# Patient Record
Sex: Female | Born: 1960 | Race: Black or African American | Hispanic: No | Marital: Single | Smoking: Never smoker
Health system: Southern US, Community
[De-identification: ages and names within clinical notes are randomized; demographics above are authoritative.]

## PROBLEM LIST (undated history)

## (undated) DIAGNOSIS — Z9221 Personal history of antineoplastic chemotherapy: Secondary | ICD-10-CM

## (undated) DIAGNOSIS — I10 Essential (primary) hypertension: Secondary | ICD-10-CM

## (undated) DIAGNOSIS — Z973 Presence of spectacles and contact lenses: Secondary | ICD-10-CM

## (undated) DIAGNOSIS — B009 Herpesviral infection, unspecified: Secondary | ICD-10-CM

## (undated) DIAGNOSIS — C50311 Malignant neoplasm of lower-inner quadrant of right female breast: Secondary | ICD-10-CM

## (undated) DIAGNOSIS — Z171 Estrogen receptor negative status [ER-]: Secondary | ICD-10-CM

## (undated) DIAGNOSIS — G4700 Insomnia, unspecified: Secondary | ICD-10-CM

## (undated) DIAGNOSIS — F419 Anxiety disorder, unspecified: Secondary | ICD-10-CM

## (undated) DIAGNOSIS — F329 Major depressive disorder, single episode, unspecified: Secondary | ICD-10-CM

## (undated) DIAGNOSIS — G5793 Unspecified mononeuropathy of bilateral lower limbs: Secondary | ICD-10-CM

## (undated) DIAGNOSIS — G63 Polyneuropathy in diseases classified elsewhere: Secondary | ICD-10-CM

## (undated) DIAGNOSIS — R351 Nocturia: Secondary | ICD-10-CM

## (undated) DIAGNOSIS — C801 Malignant (primary) neoplasm, unspecified: Secondary | ICD-10-CM

## (undated) DIAGNOSIS — D649 Anemia, unspecified: Secondary | ICD-10-CM

## (undated) DIAGNOSIS — F32A Depression, unspecified: Secondary | ICD-10-CM

## (undated) DIAGNOSIS — Z9889 Other specified postprocedural states: Secondary | ICD-10-CM

## (undated) DIAGNOSIS — R112 Nausea with vomiting, unspecified: Secondary | ICD-10-CM

## (undated) DIAGNOSIS — R350 Frequency of micturition: Secondary | ICD-10-CM

## (undated) HISTORY — DX: Essential (primary) hypertension: I10

## (undated) HISTORY — PX: LAPAROSCOPIC TUBAL LIGATION: SUR803

## (undated) HISTORY — DX: Herpesviral infection, unspecified: B00.9

## (undated) HISTORY — PX: COLONOSCOPY: SHX174

---

## 2000-04-28 ENCOUNTER — Other Ambulatory Visit: Admission: RE | Admit: 2000-04-28 | Discharge: 2000-04-28 | Payer: Self-pay | Admitting: Gynecology

## 2000-05-02 ENCOUNTER — Encounter: Admission: RE | Admit: 2000-05-02 | Discharge: 2000-05-02 | Payer: Self-pay | Admitting: Gynecology

## 2000-05-02 ENCOUNTER — Encounter: Payer: Self-pay | Admitting: Gynecology

## 2000-08-03 ENCOUNTER — Other Ambulatory Visit: Admission: RE | Admit: 2000-08-03 | Discharge: 2000-08-03 | Payer: Self-pay | Admitting: Gynecology

## 2000-11-16 ENCOUNTER — Encounter: Payer: Self-pay | Admitting: Obstetrics

## 2000-11-16 ENCOUNTER — Ambulatory Visit (HOSPITAL_COMMUNITY): Admission: RE | Admit: 2000-11-16 | Discharge: 2000-11-16 | Payer: Self-pay | Admitting: Obstetrics

## 2001-01-23 ENCOUNTER — Ambulatory Visit (HOSPITAL_COMMUNITY): Admission: RE | Admit: 2001-01-23 | Discharge: 2001-01-23 | Payer: Self-pay | Admitting: Obstetrics

## 2001-01-23 ENCOUNTER — Encounter: Payer: Self-pay | Admitting: Obstetrics

## 2001-02-03 ENCOUNTER — Encounter: Payer: Self-pay | Admitting: Obstetrics

## 2001-02-03 ENCOUNTER — Ambulatory Visit (HOSPITAL_COMMUNITY): Admission: RE | Admit: 2001-02-03 | Discharge: 2001-02-03 | Payer: Self-pay | Admitting: Obstetrics

## 2001-06-07 ENCOUNTER — Inpatient Hospital Stay (HOSPITAL_COMMUNITY): Admission: AD | Admit: 2001-06-07 | Discharge: 2001-06-09 | Payer: Self-pay | Admitting: Obstetrics

## 2001-06-07 ENCOUNTER — Encounter (INDEPENDENT_AMBULATORY_CARE_PROVIDER_SITE_OTHER): Payer: Self-pay

## 2001-08-10 ENCOUNTER — Ambulatory Visit (HOSPITAL_COMMUNITY): Admission: RE | Admit: 2001-08-10 | Discharge: 2001-08-10 | Payer: Self-pay | Admitting: Obstetrics

## 2002-10-31 ENCOUNTER — Emergency Department (HOSPITAL_COMMUNITY): Admission: EM | Admit: 2002-10-31 | Discharge: 2002-10-31 | Payer: Self-pay | Admitting: Emergency Medicine

## 2002-11-14 ENCOUNTER — Other Ambulatory Visit: Admission: RE | Admit: 2002-11-14 | Discharge: 2002-11-14 | Payer: Self-pay | Admitting: Internal Medicine

## 2003-05-30 ENCOUNTER — Emergency Department (HOSPITAL_COMMUNITY): Admission: EM | Admit: 2003-05-30 | Discharge: 2003-05-30 | Payer: Self-pay | Admitting: Family Medicine

## 2003-12-05 ENCOUNTER — Other Ambulatory Visit: Admission: RE | Admit: 2003-12-05 | Discharge: 2003-12-05 | Payer: Self-pay | Admitting: Internal Medicine

## 2005-01-21 ENCOUNTER — Encounter: Admission: RE | Admit: 2005-01-21 | Discharge: 2005-01-21 | Payer: Self-pay | Admitting: Internal Medicine

## 2006-08-11 ENCOUNTER — Emergency Department (HOSPITAL_COMMUNITY): Admission: EM | Admit: 2006-08-11 | Discharge: 2006-08-11 | Payer: Self-pay | Admitting: Emergency Medicine

## 2006-12-21 ENCOUNTER — Emergency Department (HOSPITAL_COMMUNITY): Admission: EM | Admit: 2006-12-21 | Discharge: 2006-12-21 | Payer: Self-pay | Admitting: Family Medicine

## 2007-03-16 ENCOUNTER — Other Ambulatory Visit: Admission: RE | Admit: 2007-03-16 | Discharge: 2007-03-16 | Payer: Self-pay | Admitting: Gynecology

## 2007-10-19 ENCOUNTER — Encounter: Payer: Self-pay | Admitting: Gynecology

## 2007-10-19 ENCOUNTER — Ambulatory Visit: Payer: Self-pay | Admitting: Gynecology

## 2007-10-19 ENCOUNTER — Other Ambulatory Visit: Admission: RE | Admit: 2007-10-19 | Discharge: 2007-10-19 | Payer: Self-pay | Admitting: Gynecology

## 2008-02-12 ENCOUNTER — Encounter: Admission: RE | Admit: 2008-02-12 | Discharge: 2008-02-12 | Payer: Self-pay | Admitting: Gynecology

## 2008-03-19 ENCOUNTER — Ambulatory Visit: Payer: Self-pay | Admitting: Gynecology

## 2008-03-19 ENCOUNTER — Encounter: Payer: Self-pay | Admitting: Gynecology

## 2008-03-19 ENCOUNTER — Other Ambulatory Visit: Admission: RE | Admit: 2008-03-19 | Discharge: 2008-03-19 | Payer: Self-pay | Admitting: Gynecology

## 2008-08-19 ENCOUNTER — Ambulatory Visit: Payer: Self-pay | Admitting: Gynecology

## 2008-08-28 ENCOUNTER — Emergency Department (HOSPITAL_COMMUNITY): Admission: EM | Admit: 2008-08-28 | Discharge: 2008-08-28 | Payer: Self-pay | Admitting: Emergency Medicine

## 2008-10-23 ENCOUNTER — Ambulatory Visit: Payer: Self-pay | Admitting: Gynecology

## 2009-01-24 ENCOUNTER — Emergency Department (HOSPITAL_COMMUNITY): Admission: EM | Admit: 2009-01-24 | Discharge: 2009-01-24 | Payer: Self-pay | Admitting: Family Medicine

## 2009-02-12 ENCOUNTER — Ambulatory Visit: Payer: Self-pay | Admitting: Gynecology

## 2009-03-21 ENCOUNTER — Other Ambulatory Visit: Admission: RE | Admit: 2009-03-21 | Discharge: 2009-03-21 | Payer: Self-pay | Admitting: Gynecology

## 2009-03-21 ENCOUNTER — Encounter: Admission: RE | Admit: 2009-03-21 | Discharge: 2009-03-21 | Payer: Self-pay | Admitting: Gynecology

## 2009-03-21 ENCOUNTER — Ambulatory Visit: Payer: Self-pay | Admitting: Gynecology

## 2009-04-22 ENCOUNTER — Ambulatory Visit: Payer: Self-pay | Admitting: Hematology and Oncology

## 2009-04-24 LAB — MORPHOLOGY

## 2009-04-24 LAB — URINALYSIS, MICROSCOPIC - CHCC
Leukocyte Esterase: NEGATIVE
Protein: NEGATIVE mg/dL
RBC count: NEGATIVE (ref 0–2)
pH: 8 (ref 4.6–8.0)

## 2009-04-24 LAB — CBC & DIFF AND RETIC
Basophils Absolute: 0 10*3/uL (ref 0.0–0.1)
EOS%: 0.5 % (ref 0.0–7.0)
HCT: 33 % — ABNORMAL LOW (ref 34.8–46.6)
HGB: 11.5 g/dL — ABNORMAL LOW (ref 11.6–15.9)
Immature Retic Fract: 3.7 % (ref 0.00–10.70)
LYMPH%: 39.8 % (ref 14.0–49.7)
MCH: 32.7 pg (ref 25.1–34.0)
MCV: 93.8 fL (ref 79.5–101.0)
NEUT%: 51.4 % (ref 38.4–76.8)
Platelets: 256 10*3/uL (ref 145–400)
lymph#: 1.5 10*3/uL (ref 0.9–3.3)

## 2009-04-28 LAB — FECAL OCCULT BLOOD, GUAIAC: Occult Blood: NEGATIVE

## 2009-04-28 LAB — IRON AND TIBC: TIBC: 262 ug/dL (ref 250–470)

## 2009-04-28 LAB — LACTATE DEHYDROGENASE: LDH: 130 U/L (ref 94–250)

## 2009-04-28 LAB — COMPREHENSIVE METABOLIC PANEL
ALT: 10 U/L (ref 0–35)
CO2: 27 mEq/L (ref 19–32)
Calcium: 8.9 mg/dL (ref 8.4–10.5)
Chloride: 102 mEq/L (ref 96–112)
Creatinine, Ser: 0.85 mg/dL (ref 0.40–1.20)
Total Protein: 7.1 g/dL (ref 6.0–8.3)

## 2009-04-28 LAB — HEMOGLOBINOPATHY EVALUATION
Hgb A: 97.5 % (ref 96.8–97.8)
Hgb F Quant: 0 % (ref 0.0–2.0)
Hgb S Quant: 0 % (ref 0.0–0.0)

## 2009-04-28 LAB — PROTEIN ELECTROPHORESIS, SERUM, WITH REFLEX
Beta 2: 3.7 % (ref 3.2–6.5)
Beta Globulin: 4.7 % (ref 4.7–7.2)

## 2009-04-28 LAB — HAPTOGLOBIN: Haptoglobin: 123 mg/dL (ref 16–200)

## 2009-09-01 ENCOUNTER — Ambulatory Visit: Payer: Self-pay | Admitting: Hematology and Oncology

## 2010-02-17 ENCOUNTER — Other Ambulatory Visit: Payer: Self-pay | Admitting: Gynecology

## 2010-02-17 DIAGNOSIS — Z1239 Encounter for other screening for malignant neoplasm of breast: Secondary | ICD-10-CM

## 2010-03-23 ENCOUNTER — Ambulatory Visit: Payer: Self-pay

## 2010-04-13 ENCOUNTER — Encounter (INDEPENDENT_AMBULATORY_CARE_PROVIDER_SITE_OTHER): Payer: Managed Care, Other (non HMO) | Admitting: Gynecology

## 2010-04-13 ENCOUNTER — Other Ambulatory Visit: Payer: Self-pay | Admitting: Gynecology

## 2010-04-13 ENCOUNTER — Other Ambulatory Visit (HOSPITAL_COMMUNITY)
Admission: RE | Admit: 2010-04-13 | Discharge: 2010-04-13 | Disposition: A | Discharge status: Home / Self Care | Payer: Managed Care, Other (non HMO) | Source: Ambulatory Visit | Attending: Gynecology | Admitting: Gynecology

## 2010-04-13 DIAGNOSIS — Z01419 Encounter for gynecological examination (general) (routine) without abnormal findings: Secondary | ICD-10-CM

## 2010-04-13 DIAGNOSIS — Z124 Encounter for screening for malignant neoplasm of cervix: Secondary | ICD-10-CM | POA: Insufficient documentation

## 2010-06-12 NOTE — Op Note (Signed)
[facility]  Patient:    Jaime Murillo, Jaime Murillo Visit Number: [id number] MRN: [id number]          Service Type: DSU Location: [facility] Attending Physician:  Venita Sheffield Dictated by:   Kathreen Cosier, M.D. Proc. Date: 08/10/01 Admit Date:  08/10/2001 Discharge Date: 08/10/2001                             Operative Report  PREOPERATIVE DIAGNOSIS:       Multiparity.  PROCEDURE:                    Open laparoscopic tubal sterilization.  SURGEON:                      Kathreen Cosier, M.D.  DESCRIPTION OF PROCEDURE:     Under general anesthesia with the patient in the lithotomy position, the abdomen, perineum and vagina were prepped and draped. The bladder was emptied with a straight catheter.  A weighted speculum was placed in the vagina.  The anterior lip of the cervix was grasped with a Hulka tenaculum.  In the umbilicus, a transverse incision was made and carried down to the fascia.  The fascia was cleanly grasped with two Kochers.  The fascia in the peritoneum was opened with Mayo scissors.  The sleeve of the trocar was inserted intraperitoneally.  A total of 3 L of carbon dioxide was infused intraperitoneally.  The visualizing scope was inserted.  The uterus, tube and ovaries were normal.  A cautery probe was inserted through the sleeve of the scope.  The right tube was grasped 1 inch from the cornu and cauterized.  It was cauterized in a total of five places moving lateral to the first site of cautery.  The procedure was done in the exact same fashion on the other side. The probe was removed and CO2 allowed to escape from the peritoneal cavity. The fascia was closed with one suture of 0 Dexon.  The skin was closed with subcuticular stitch of 3-0 plain.  The patient tolerated the procedure well and was taken to the recovery room in good condition. Dictated by:   Kathreen Cosier, M.D. Attending Physician:  Venita Sheffield DD:  08/10/01 TD:  08/14/01 Job: [id number]

## 2010-11-03 LAB — POCT URINALYSIS DIP (DEVICE)
Ketones, ur: NEGATIVE
Operator id: 208841
Protein, ur: NEGATIVE
Specific Gravity, Urine: 1.02
pH: 7

## 2011-02-12 ENCOUNTER — Telehealth: Payer: Self-pay | Admitting: *Deleted

## 2011-02-12 MED ORDER — HYDROCHLOROTHIAZIDE 25 MG PO TABS: 25.0000 mg | ORAL_TABLET | Freq: Every day | ORAL | Status: DC

## 2011-02-12 MED ORDER — ACYCLOVIR 400 MG PO TABS: 400.0000 mg | ORAL_TABLET | Freq: Every day | ORAL | Status: AC

## 2011-02-12 NOTE — Telephone Encounter (Signed)
Pt Jaime Murillo staff message okay to give HCTZ 25 MG AND ACYCLOVIR 400 90 DAY SUPPLY. RX SENT TO PHARMACY.

## 2011-02-12 NOTE — Telephone Encounter (Signed)
Pt called needed new 90 day  rx for mail order pharmacy for valtrex  400 mg daily and hydrochlorothiazide 25 mg daily. Okay send rx? Please advise

## 2011-04-15 ENCOUNTER — Telehealth: Payer: Self-pay | Admitting: *Deleted

## 2011-04-15 MED ORDER — HYDROCHLOROTHIAZIDE 25 MG PO TABS: 25.0000 mg | ORAL_TABLET | Freq: Every day | ORAL | Status: DC

## 2011-04-15 MED ORDER — ACYCLOVIR 400 MG PO TABS: 400.0000 mg | ORAL_TABLET | Freq: Every day | ORAL | Status: AC

## 2011-04-15 NOTE — Telephone Encounter (Signed)
Pt called with new pharamcy and wants rx sent to [facility] rx pharmacy. rx sent.

## 2011-04-21 ENCOUNTER — Telehealth: Payer: Self-pay | Admitting: *Deleted

## 2011-04-21 NOTE — Telephone Encounter (Signed)
Pt now has mail order pharmacy [employer] and will need a new rx for her HCTZ 25 MG AND ACYCLOVIR 400 mg(see 02/12/11 telephone encounter) . Pt has annual scheduled on 4/10. Rx is on pt paper chart to fill out and i will fax to pharmacy.

## 2011-04-21 NOTE — Telephone Encounter (Signed)
Both rx written will be faxed to [phone].

## 2011-05-05 ENCOUNTER — Ambulatory Visit (INDEPENDENT_AMBULATORY_CARE_PROVIDER_SITE_OTHER): Payer: Managed Care, Other (non HMO) | Admitting: Gynecology

## 2011-05-05 ENCOUNTER — Encounter: Payer: Self-pay | Admitting: Gynecology

## 2011-05-05 ENCOUNTER — Telehealth: Payer: Self-pay

## 2011-05-05 VITALS — BP 134/88 | Ht 64.75 in | Wt 115.0 lb

## 2011-05-05 DIAGNOSIS — R634 Abnormal weight loss: Secondary | ICD-10-CM

## 2011-05-05 DIAGNOSIS — Z01419 Encounter for gynecological examination (general) (routine) without abnormal findings: Secondary | ICD-10-CM

## 2011-05-05 DIAGNOSIS — I10 Essential (primary) hypertension: Secondary | ICD-10-CM

## 2011-05-05 NOTE — Telephone Encounter (Signed)
Dr. Glenetta Hew sent me a note asking me to check ins benefits for ablation. I did and it goes towards patient's deductible and will cost he $3462 to have.  I let Dr. Glenetta Hew know and he responded with below text in a staff message:  Jaime Murillo a forgot to mention to see them they will cover for the Mirena IUD. Please check on this and let her to as well at this would be an option because it was cut down her bleeding or there is medication that I could put her on call Lysteda 650 mg which she can take 2 tablets 3 times a day during her cycle to cut down the amount of bleeding. We can prescribe her 30 with 11 refills. Thank you  I have left message for patient to call me and I will share this info.

## 2011-05-05 NOTE — Telephone Encounter (Signed)
Mirena and insertion covered 100% in keeping with government health care reform.

## 2011-05-05 NOTE — Patient Instructions (Signed)
Jaime Murillo, remember to schedule your mammogram. Also after your birthday, please call the gastroenterologist to schedule her colonoscopy. Olegario Messier will call you sometime this week or next in reference to the endometrial ablation for your heavy periods. Remember also to follow with the [facility] family Dr. Maryclare Labrador need to adjust your blood pressure medication please take care of this as soon as possible.

## 2011-05-05 NOTE — Progress Notes (Signed)
Jaime Murillo 23-Sep-1960 [id number]   History:    51 y.o.  for annual exam with complaints of heavy periods lasting 5-6 days. The first 2-3 days are heavy with passage of large blood clots. Patient with prior history of tubal ligation. Patient's last mammogram in 2011. She does her monthly self breast examination. Patient will be 51 in June. She has not seen her primary physician in over year. She does have history of hypertension for which she is on HCTZ 25 mg daily. Her blood pressure was 150/94 and on repeat was 134/88. Patient has a low BMI of 19.28.  Past medical history,surgical history, family history and social history were all reviewed and documented in the EPIC chart.  Gynecologic History Patient's last menstrual period was 04/15/2011. Contraception: tubal ligation Last Pap: 2012. Results were: normal Last mammogram: 2011. Results were: normal  Obstetric History OB History    Grav Para Term Preterm Abortions TAB SAB Ect Mult Living   3 2 2  1  1   2      # Outc Date GA Lbr Len/2nd Wgt Sex Del Anes PTL Lv   1 TRM     M SVD  No Yes   2 TRM     M SVD  No Yes   3 SAB                ROS:  Was performed and pertinent positives and negatives are included in the history.  Exam: chaperone present  BP 134/88  Ht 5' 4.75" (1.645 m)  Wt 115 lb (52.164 kg)  BMI 19.29 kg/m2  LMP 04/15/2011  Body mass index is 19.29 kg/(m^2).  General appearance : Well developed well nourished female. No acute distress HEENT: Neck supple, trachea midline, no carotid bruits, no thyroidmegaly Lungs: Clear to auscultation, no rhonchi or wheezes, or rib retractions  Heart: Regular rate and rhythm, no murmurs or gallops Breast:Examined in sitting and supine position were symmetrical in appearance, no palpable masses or tenderness,  no skin retraction, no nipple inversion, no nipple discharge, no skin discoloration, no axillary or supraclavicular lymphadenopathy Abdomen: no palpable masses or  tenderness, no rebound or guarding Extremities: no edema or skin discoloration or tenderness  Pelvic:  Bartholin, Urethra, Skene Glands: Within normal limits             Vagina: No gross lesions or discharge  Cervix: No gross lesions or discharge  Uterus  retroverted slightly irregular at the fundus possible small fibroids,  non-tender and mobile  Adnexa  Without masses or tenderness  Anus and perineum  normal   Rectovaginal  normal sphincter tone without palpated masses or tenderness             Hemoccult normal rectal exam.     Assessment/Plan:  51 y.o. female for annual exam perimenopausal with heavy periods. We discussed endometrial ablation in the office (cryoablation) for which literature information was provided. Patient is interested and will check insurance coverage and schedule sonohysterogram and endometrial biopsy accordingly. We discussed the new screening guidelines on Pap smears. No prior abnormal Pap smears . Last normal Pap smear 2012. No Pap smear done today. When patient turns 51 in June she will need to schedule shortly thereafter a colonoscopy with the gastroenterologist for which I have given her names. She was reminded to followup with her family physician in reference to her hypertension. The following labs were drawn today: CBC, cholesterol, competence metabolic panel, urinalysis, and TSH. She was encouraged to continue  to do her monthly self breast examination and to take calcium and vitamin D for osteoporosis prevention. She exercises on a regular basis.    [name] MD, 9:14 AM 05/05/2011

## 2011-05-06 LAB — URINALYSIS W MICROSCOPIC + REFLEX CULTURE
Casts: NONE SEEN
Crystals: NONE SEEN
Glucose, UA: NEGATIVE mg/dL
Hgb urine dipstick: NEGATIVE
Leukocytes, UA: NEGATIVE
Specific Gravity, Urine: 1.011 (ref 1.005–1.030)
pH: 6 (ref 5.0–8.0)

## 2011-05-13 ENCOUNTER — Telehealth: Payer: Self-pay | Admitting: Gynecology

## 2011-05-13 NOTE — Telephone Encounter (Signed)
I talked with [employer] again.  If patient is not using the IUD for birth control then it falls under her regular medical benefits. The [employer] reform only affects IUD being used for the purpose of birth control.  Patient informed that Her Option ablation will cost her $67 and Mirena IUD will cost her [phone].  Patient said neither of these are financially feasible options as this time.    I informed Dr. Glenetta Hew.

## 2011-05-14 ENCOUNTER — Telehealth: Payer: Self-pay | Admitting: Gynecology

## 2011-05-14 ENCOUNTER — Other Ambulatory Visit: Payer: Self-pay | Admitting: Gynecology

## 2011-05-14 MED ORDER — TRANEXAMIC ACID 650 MG PO TABS: ORAL_TABLET | ORAL | Status: DC

## 2011-05-14 NOTE — Telephone Encounter (Signed)
Opened this encounter in error.

## 2011-05-14 NOTE — Telephone Encounter (Signed)
I called patient to let her know that since the Endo Ablation and Mirena are not financially feasible options for her Dr. Glenetta Hew suggested she might try Lysteda with her cycle to decrease her bleeding.  He told me he will put in an e-RX for her.  I told patient I do not have any idea regarding costs and she might give her a pharmacy a heads up that RX is coming in and ask them to call her with the price before they fill it in case it is also expensive.  I told her if it is too costly for her to try to call me and let me know and we will let Dr.JF know.  She agreed and would like to try it if it is reasonably affordable.

## 2011-10-26 ENCOUNTER — Telehealth: Payer: Self-pay | Admitting: *Deleted

## 2011-10-26 NOTE — Telephone Encounter (Signed)
Prescribed 30 tablets only with no refill she does need to followup with her primary for her hypertension.

## 2011-10-26 NOTE — Telephone Encounter (Signed)
Pt calling to requesting a refill on her hydrochlorothiazide 25 mg tablet 1 po daily. 90 day supply. Okay to fill?

## 2011-10-27 MED ORDER — HYDROCHLOROTHIAZIDE 25 MG PO TABS: 25.0000 mg | ORAL_TABLET | Freq: Every day | ORAL | Status: DC

## 2011-10-27 NOTE — Telephone Encounter (Signed)
rx sent, pt informed with all the below note.

## 2011-11-28 ENCOUNTER — Emergency Department (INDEPENDENT_AMBULATORY_CARE_PROVIDER_SITE_OTHER)
Admission: EM | Admit: 2011-11-28 | Discharge: 2011-11-28 | Disposition: A | Discharge status: Home / Self Care | Payer: Managed Care, Other (non HMO) | Source: Home / Self Care | Attending: Emergency Medicine | Admitting: Emergency Medicine

## 2011-11-28 ENCOUNTER — Encounter (HOSPITAL_COMMUNITY): Payer: Self-pay | Admitting: Emergency Medicine

## 2011-11-28 DIAGNOSIS — R8290 Unspecified abnormal findings in urine: Secondary | ICD-10-CM

## 2011-11-28 DIAGNOSIS — R82998 Other abnormal findings in urine: Secondary | ICD-10-CM

## 2011-11-28 DIAGNOSIS — R109 Unspecified abdominal pain: Secondary | ICD-10-CM

## 2011-11-28 LAB — POCT PREGNANCY, URINE: Preg Test, Ur: NEGATIVE

## 2011-11-28 MED ORDER — NITROFURANTOIN MONOHYD MACRO 100 MG PO CAPS: 100.0000 mg | ORAL_CAPSULE | Freq: Two times a day (BID) | ORAL | Status: AC

## 2011-11-28 NOTE — ED Notes (Addendum)
Pt c/o of lower back pain and right flank pain x 6 days. Strong odor to urine x 2 days.  Pt denies pain or burning with urination.  Pt denies any other symptoms. Pt has increased water intake and drinking cranberry juice with no relief in symptoms.  Pt has a hx of hypertension blood pressure to day is 171/96 pt states that she has not been taking med as prescribed.

## 2011-11-28 NOTE — ED Provider Notes (Signed)
History     CSN: [id number]  Arrival date & time 11/28/11  1003   First MD Initiated Contact with Patient 11/28/11 1008      Chief Complaint  Patient presents with  . Flank Pain    lower back and right flank pain x 6 days. strong odor to urine x 2 days.     (Consider location/radiation/quality/duration/timing/severity/associated sxs/prior treatment) HPI Comments: Patient presents urgent care today complaining of right flank pain that has since then somewhat improved. She also been noticing for about a week some strong odor to her urine but has not seen any blood denies any burning, pressure or frequent urination. Patient denies any vomiting, nausea or fevers denies any recent fall or injuries. Patient also denies any constitutional symptoms such as fevers, generalized malaise, myalgias arthralgias or unintentional weight loss or changes in appetite.  Patient is a 51 y.o. female presenting with flank pain. The history is provided by the patient.  Flank Pain This is a new problem. The current episode started more than 2 days ago. The problem occurs constantly. The problem has not changed since onset.Pertinent negatives include no abdominal pain. She has tried nothing for the symptoms.    Past Medical History  Diagnosis Date  . HSV infection   . NSVD (normal spontaneous vaginal delivery)   . Hypertension     Past Surgical History  Procedure Date  . Tubal ligation 2003    LTL    Family History  Problem Relation Age of Onset  . Heart disease Mother   . Hypertension Father   . Hypertension Sister     History  Substance Use Topics  . Smoking status: Never Smoker   . Smokeless tobacco: Never Used  . Alcohol Use: No    OB History    Grav Para Term Preterm Abortions TAB SAB Ect Mult Living   3 2 2  1  1   2       Review of Systems  Constitutional: Negative for fever, chills, activity change, appetite change and fatigue.  Gastrointestinal: Negative for nausea and  abdominal pain.  Genitourinary: Positive for flank pain. Negative for dysuria, vaginal discharge, vaginal pain, pelvic pain and dyspareunia.  Skin: Negative for rash and wound.  Neurological: Negative for dizziness.    Allergies  Review of patient's allergies indicates no known allergies.  Home Medications   Current Outpatient Rx  Name  Route  Sig  Dispense  Refill  . FERROUS SULFATE 325 (65 FE) MG PO TABS   Oral   Take 325 mg by mouth daily with breakfast.         . HYDROCHLOROTHIAZIDE 25 MG PO TABS   Oral   Take 1 tablet (25 mg total) by mouth daily.   30 tablet   0   . ONE-DAILY MULTI VITAMINS PO TABS   Oral   Take 1 tablet by mouth daily.         Marland Kitchen NITROFURANTOIN MONOHYD MACRO 100 MG PO CAPS   Oral   Take 1 capsule (100 mg total) by mouth 2 (two) times daily.   10 capsule   0   . TRANEXAMIC ACID 650 MG PO TABS      Take 2 tablets 3 times a day for 5 days are heavy periods.   30 tablet   12   . VALACYCLOVIR HCL 500 MG PO TABS   Oral   Take 500 mg by mouth 2 (two) times daily.  BP 171/96  Pulse 60  Temp 98 F (36.7 C) (Oral)  Resp 16  SpO2 100%  LMP 11/09/2011  Physical Exam  Nursing note and vitals reviewed. Constitutional: Vital signs are normal. She appears well-developed and well-nourished.  Non-toxic appearance. She does not have a sickly appearance. She does not appear ill. No distress.  Pulmonary/Chest: Effort normal and breath sounds normal.  Abdominal: Soft. She exhibits no distension and no mass. There is no hepatomegaly. There is no tenderness. There is no rigidity, no rebound, no guarding, no CVA tenderness, no tenderness at McBurney's point and negative Murphy's sign.  Neurological: She is alert.  Skin: No rash noted. No erythema.    ED Course  Procedures (including critical care time)   Labs Reviewed  POCT PREGNANCY, URINE  URINE CULTURE   No results found.   1. Flank pain   2. Abnormal urine      Bedside  ultrasound performed- as complementary of physical exam. No obvious masses or signs of moderate to severe hydronephrosis were noted. Morison's pouch unremarkable. MDM  Patient is somewhat asymptomatic during exam unable to reproduce any flank pain right upper quadrant pain. Urine reveal only trace of red blood cells. Patient is afebrile comfortable. Given patient's previous symptoms have decided to start on Macrobid and told urine culture becomes available patient agrees with treatment plan and followup care.        Jimmie Molly, MD 11/28/11 1321

## 2011-11-29 LAB — POCT URINALYSIS DIP (DEVICE)
Protein, ur: NEGATIVE mg/dL
Specific Gravity, Urine: 1.02 (ref 1.005–1.030)
Urobilinogen, UA: 2 mg/dL — ABNORMAL HIGH (ref 0.0–1.0)
pH: 7 (ref 5.0–8.0)

## 2012-05-29 ENCOUNTER — Other Ambulatory Visit: Payer: Self-pay

## 2012-05-29 DIAGNOSIS — Z1231 Encounter for screening mammogram for malignant neoplasm of breast: Secondary | ICD-10-CM

## 2012-06-06 ENCOUNTER — Telehealth: Payer: Self-pay | Admitting: *Deleted

## 2012-06-06 ENCOUNTER — Ambulatory Visit (INDEPENDENT_AMBULATORY_CARE_PROVIDER_SITE_OTHER): Payer: Managed Care, Other (non HMO) | Admitting: Gynecology

## 2012-06-06 ENCOUNTER — Encounter: Payer: Self-pay | Admitting: Gynecology

## 2012-06-06 VITALS — BP 138/86 | Ht 63.25 in | Wt 118.0 lb

## 2012-06-06 DIAGNOSIS — N6310 Unspecified lump in the right breast, unspecified quadrant: Secondary | ICD-10-CM

## 2012-06-06 DIAGNOSIS — N63 Unspecified lump in unspecified breast: Secondary | ICD-10-CM

## 2012-06-06 DIAGNOSIS — B009 Herpesviral infection, unspecified: Secondary | ICD-10-CM

## 2012-06-06 DIAGNOSIS — Z1159 Encounter for screening for other viral diseases: Secondary | ICD-10-CM

## 2012-06-06 DIAGNOSIS — A609 Anogenital herpesviral infection, unspecified: Secondary | ICD-10-CM

## 2012-06-06 DIAGNOSIS — Z01419 Encounter for gynecological examination (general) (routine) without abnormal findings: Secondary | ICD-10-CM

## 2012-06-06 LAB — COMPREHENSIVE METABOLIC PANEL
Albumin: 3.9 g/dL (ref 3.5–5.2)
CO2: 26 mEq/L (ref 19–32)
Glucose, Bld: 83 mg/dL (ref 70–99)
Sodium: 137 mEq/L (ref 135–145)
Total Bilirubin: 0.8 mg/dL (ref 0.3–1.2)
Total Protein: 6.9 g/dL (ref 6.0–8.3)

## 2012-06-06 LAB — CBC WITH DIFFERENTIAL/PLATELET
Eosinophils Absolute: 0 10*3/uL (ref 0.0–0.7)
Eosinophils Relative: 0 % (ref 0–5)
HCT: 34.4 % — ABNORMAL LOW (ref 36.0–46.0)
Hemoglobin: 11.6 g/dL — ABNORMAL LOW (ref 12.0–15.0)
Lymphs Abs: 1.2 10*3/uL (ref 0.7–4.0)
MCH: 31.4 pg (ref 26.0–34.0)
MCV: 93 fL (ref 78.0–100.0)
Monocytes Relative: 9 % (ref 3–12)
Neutrophils Relative %: 55 % (ref 43–77)
RBC: 3.7 MIL/uL — ABNORMAL LOW (ref 3.87–5.11)

## 2012-06-06 LAB — HEPATITIS C ANTIBODY: HCV Ab: NEGATIVE

## 2012-06-06 LAB — LIPID PANEL: HDL: 73 mg/dL (ref >39)

## 2012-06-06 LAB — TSH: TSH: 1.495 u[IU]/mL (ref 0.350–4.500)

## 2012-06-06 MED ORDER — VALACYCLOVIR HCL 500 MG PO TABS: 500.0000 mg | ORAL_TABLET | Freq: Every day | ORAL | Status: DC

## 2012-06-06 NOTE — Telephone Encounter (Signed)
Message copied by Aura Camps on Tue Jun 06, 2012  9:55 AM ------      Message from: [name]      Created: Tue Jun 06, 2012  8:57 AM       Victorino Dike, this patient was scheduled for a routine screening mammogram May 23 at the breast Center. When she came in today for annual exam and abnormality was detected on her right breast and she needs a diagnostic mammogram of the right breast with ultrasound and screening mammogram contralateral breast (preferably 3-D because she has dense breasts according to the last mammogram in 2011.             the patient was found to have on the right breast at the 12:00 position to finger breast from the areolar region a 2 cm multi-lobulated mass mobile and nontender. There was no supraclavicular axillary lymphadenopathy on that breast. Contralateral breast with no abnormalities detected.       ------

## 2012-06-06 NOTE — Progress Notes (Signed)
Jaime Murillo Nov 01, 1960 [id number]   History:    52 y.o.  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 On questioning patient states that she gets at least 4 outbreaks a year. Patient with prior tubal sterilization procedure. Her last mammogram was in 2011. Menstrual cycles reported to be regular. No change in sexual partner. Patient with normal Pap smears in the past. Patient's Tdap vaccine according to patient is up-to-date. Patient has a schedule mammogram for next week.  Past medical history,surgical history, family history and social history were all reviewed and documented in the EPIC chart.  Gynecologic History Patient's last menstrual period was 05/20/2012. Contraception: tubal ligation Last Pap: 2012. Results were: normal Last mammogram: 2011. Results were: normal  Obstetric History OB History   Grav Para Term Preterm Abortions TAB SAB Ect Mult Living   3 2 2  1  1   2      # Outc Date GA Lbr Len/2nd Wgt Sex Del Anes PTL Lv   1 TRM     M SVD  No Yes   2 TRM     M SVD  No Yes   3 SAB                ROS: A ROS was performed and pertinent positives and negatives are included in the history.  GENERAL: No fevers or chills. HEENT: No change in vision, no earache, sore throat or sinus congestion. NECK: No pain or stiffness. CARDIOVASCULAR: No chest pain or pressure. No palpitations. PULMONARY: No shortness of breath, cough or wheeze. GASTROINTESTINAL: No abdominal pain, nausea, vomiting or diarrhea, melena or bright red blood per rectum. GENITOURINARY: No urinary frequency, urgency, hesitancy or dysuria. MUSCULOSKELETAL: No joint or muscle pain, no back pain, no recent trauma. DERMATOLOGIC: No rash, no itching, no lesions. ENDOCRINE: No polyuria, polydipsia, no heat or cold intolerance. No recent change in weight. HEMATOLOGICAL: No anemia or easy bruising or bleeding. NEUROLOGIC: No headache, seizures, numbness, tingling or weakness.  PSYCHIATRIC: No depression, no loss of interest in normal activity or change in sleep pattern.     Exam: chaperone present  BP 138/86  Ht 5' 3.25" (1.607 m)  Wt 118 lb (53.524 kg)  BMI 20.73 kg/m2  LMP 05/20/2012  Body mass index is 20.73 kg/(m^2).  General appearance : Well developed well nourished female. No acute distress HEENT: Neck supple, trachea midline, no carotid bruits, no thyroidmegaly Lungs: Clear to auscultation, no rhonchi or wheezes, or rib retractions  Heart: Regular rate and rhythm, no murmurs or gallops Breast:Examined in sitting and supine position were symmetrical in appearance, the patient was found to have on the right breast at the 12:00 position to finger breast from the areolar region a 2 cm multi-lobulated mass mobile and nontender. There was no supraclavicular axillary lymphadenopathy on that breast. Contralateral breast with no abnormalities detected. Abdomen: no palpable masses or tenderness, no rebound or guarding Extremities: no edema or skin discoloration or tenderness  Pelvic:  Bartholin, Urethra, Skene Glands: Within normal limits             Vagina: No gross lesions or discharge  Cervix: No gross lesions or discharge  Uterus  anteverted, normal size, shape and consistency, non-tender and mobile  Adnexa  Without masses or tenderness  Anus and perineum  normal   Rectovaginal  normal sphincter tone without palpated masses or tenderness  Hemoccult not indicated     Assessment/Plan:  52 y.o. female for annual exam with incidental findings of a right breast mass. Patient previously had schedule for a screening mammogram in 2 weeks. With today's findings we will try to move her mammogram much sooner and we'll proceed with scheduling a diagnostic mammogram with ultrasound. The following labs will be work today: Comprehensive metabolic panel, TSH, fasting lipid profile, and urinalysis. No Pap smear done today U. Screening guidelines discussed.  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  New CDC guidelines is recommending patients be tested once in her lifetime for hepatitis C antibody who were born between [birth year] through 1964. This was discussed with the patient today and has agreed to be tested today.    [name] MD, 8:53 AM 06/06/2012

## 2012-06-06 NOTE — Telephone Encounter (Signed)
Order placed for the below note.

## 2012-06-07 ENCOUNTER — Telehealth: Payer: Self-pay | Admitting: *Deleted

## 2012-06-07 LAB — URINALYSIS W MICROSCOPIC + REFLEX CULTURE
Casts: NONE SEEN
Crystals: NONE SEEN
Glucose, UA: NEGATIVE mg/dL
Leukocytes, UA: NEGATIVE
Specific Gravity, Urine: 1.021 (ref 1.005–1.030)
Squamous Epithelial / LPF: NONE SEEN
pH: 6.5 (ref 5.0–8.0)

## 2012-06-07 NOTE — Telephone Encounter (Signed)
Left message for pt to call to find out which pharmacy would like rx sent to.

## 2012-06-07 NOTE — Telephone Encounter (Signed)
Pt calling requesting refill on her HCTZ 25 mg daily. Pt said you are the physician that prescribed medication to her. Please advise

## 2012-06-07 NOTE — Telephone Encounter (Signed)
Appt. 06/20/12 at breast center.

## 2012-06-07 NOTE — Telephone Encounter (Signed)
Please call in HCTZ 25 mg to take 1 by mouth daily #3011 refills

## 2012-06-08 MED ORDER — HYDROCHLOROTHIAZIDE 25 MG PO TABS: 25.0000 mg | ORAL_TABLET | Freq: Every day | ORAL | Status: DC

## 2012-06-08 NOTE — Telephone Encounter (Signed)
rx sent to [facility].

## 2012-06-16 ENCOUNTER — Ambulatory Visit: Payer: Managed Care, Other (non HMO)

## 2012-06-20 ENCOUNTER — Ambulatory Visit
Admission: RE | Admit: 2012-06-20 | Discharge: 2012-06-20 | Disposition: A | Discharge status: Home / Self Care | Payer: Managed Care, Other (non HMO) | Source: Ambulatory Visit | Attending: Gynecology | Admitting: Gynecology

## 2012-06-20 ENCOUNTER — Other Ambulatory Visit: Payer: Self-pay | Admitting: Gynecology

## 2012-06-20 DIAGNOSIS — N6310 Unspecified lump in the right breast, unspecified quadrant: Secondary | ICD-10-CM

## 2012-06-21 ENCOUNTER — Other Ambulatory Visit: Payer: Self-pay | Admitting: Gynecology

## 2012-06-21 DIAGNOSIS — C50911 Malignant neoplasm of unspecified site of right female breast: Secondary | ICD-10-CM

## 2012-06-23 ENCOUNTER — Telehealth: Payer: Self-pay | Admitting: *Deleted

## 2012-06-23 DIAGNOSIS — C50319 Malignant neoplasm of lower-inner quadrant of unspecified female breast: Secondary | ICD-10-CM | POA: Insufficient documentation

## 2012-06-23 DIAGNOSIS — C50311 Malignant neoplasm of lower-inner quadrant of right female breast: Secondary | ICD-10-CM

## 2012-06-23 DIAGNOSIS — Z171 Estrogen receptor negative status [ER-]: Secondary | ICD-10-CM | POA: Insufficient documentation

## 2012-06-23 NOTE — Telephone Encounter (Signed)
Confirmed [facility] for 06/28/12 at 1200 .  Instructions and contact information given.

## 2012-06-26 ENCOUNTER — Ambulatory Visit
Admission: RE | Admit: 2012-06-26 | Discharge: 2012-06-26 | Disposition: A | Discharge status: Home / Self Care | Payer: 59 | Source: Ambulatory Visit | Attending: Gynecology | Admitting: Gynecology

## 2012-06-26 DIAGNOSIS — C50911 Malignant neoplasm of unspecified site of right female breast: Secondary | ICD-10-CM

## 2012-06-26 MED ORDER — GADOBENATE DIMEGLUMINE 529 MG/ML IV SOLN
10.0000 mL | Freq: Once | INTRAVENOUS | Status: AC | PRN
  Administered 2012-06-26: 10 mL via INTRAVENOUS

## 2012-06-27 ENCOUNTER — Other Ambulatory Visit: Payer: Self-pay | Admitting: Gynecology

## 2012-06-27 DIAGNOSIS — R928 Other abnormal and inconclusive findings on diagnostic imaging of breast: Secondary | ICD-10-CM

## 2012-06-28 ENCOUNTER — Encounter: Payer: Self-pay | Admitting: *Deleted

## 2012-06-28 ENCOUNTER — Encounter: Payer: Self-pay | Admitting: Oncology

## 2012-06-28 ENCOUNTER — Ambulatory Visit: Payer: Self-pay

## 2012-06-28 ENCOUNTER — Ambulatory Visit (HOSPITAL_BASED_OUTPATIENT_CLINIC_OR_DEPARTMENT_OTHER): Payer: Managed Care, Other (non HMO) | Admitting: Oncology

## 2012-06-28 ENCOUNTER — Ambulatory Visit
Admission: RE | Admit: 2012-06-28 | Discharge: 2012-06-28 | Disposition: A | Discharge status: Home / Self Care | Payer: 59 | Source: Ambulatory Visit | Attending: Radiation Oncology | Admitting: Radiation Oncology

## 2012-06-28 ENCOUNTER — Other Ambulatory Visit (HOSPITAL_BASED_OUTPATIENT_CLINIC_OR_DEPARTMENT_OTHER): Payer: Managed Care, Other (non HMO)

## 2012-06-28 ENCOUNTER — Ambulatory Visit: Payer: 59 | Admitting: Physical Therapy

## 2012-06-28 ENCOUNTER — Other Ambulatory Visit: Payer: Self-pay | Admitting: Medical Oncology

## 2012-06-28 ENCOUNTER — Encounter: Payer: Self-pay | Admitting: Radiation Oncology

## 2012-06-28 ENCOUNTER — Ambulatory Visit (HOSPITAL_BASED_OUTPATIENT_CLINIC_OR_DEPARTMENT_OTHER): Payer: Managed Care, Other (non HMO) | Admitting: General Surgery

## 2012-06-28 VITALS — BP 152/95 | HR 77 | Temp 98.2°F | Resp 20 | Ht 63.25 in | Wt 116.5 lb

## 2012-06-28 DIAGNOSIS — C50311 Malignant neoplasm of lower-inner quadrant of right female breast: Secondary | ICD-10-CM

## 2012-06-28 DIAGNOSIS — Z171 Estrogen receptor negative status [ER-]: Secondary | ICD-10-CM

## 2012-06-28 DIAGNOSIS — C50919 Malignant neoplasm of unspecified site of unspecified female breast: Secondary | ICD-10-CM

## 2012-06-28 DIAGNOSIS — C50319 Malignant neoplasm of lower-inner quadrant of unspecified female breast: Secondary | ICD-10-CM

## 2012-06-28 DIAGNOSIS — I10 Essential (primary) hypertension: Secondary | ICD-10-CM

## 2012-06-28 LAB — COMPREHENSIVE METABOLIC PANEL (CC13)
Albumin: 3.9 g/dL (ref 3.5–5.0)
CO2: 29 mEq/L (ref 22–29)
Calcium: 9.3 mg/dL (ref 8.4–10.4)
Glucose: 75 mg/dl (ref 70–99)
Potassium: 3.5 mEq/L (ref 3.5–5.1)
Sodium: 139 mEq/L (ref 136–145)
Total Protein: 7.7 g/dL (ref 6.4–8.3)

## 2012-06-28 LAB — CBC WITH DIFFERENTIAL/PLATELET
Basophils Absolute: 0 10*3/uL (ref 0.0–0.1)
Eosinophils Absolute: 0 10*3/uL (ref 0.0–0.5)
HGB: 12.3 g/dL (ref 11.6–15.9)
MONO#: 0.4 10*3/uL (ref 0.1–0.9)
NEUT#: 1.9 10*3/uL (ref 1.5–6.5)
Platelets: 256 10*3/uL (ref 145–400)
RBC: 3.83 10*6/uL (ref 3.70–5.45)
RDW: 12.3 % (ref 11.2–14.5)
WBC: 4.1 10*3/uL (ref 3.9–10.3)

## 2012-06-28 MED ORDER — PROCHLORPERAZINE 25 MG RE SUPP: 25.0000 mg | Freq: Two times a day (BID) | RECTAL | Status: DC | PRN

## 2012-06-28 MED ORDER — ONDANSETRON HCL 8 MG PO TABS: 8.0000 mg | ORAL_TABLET | Freq: Two times a day (BID) | ORAL | Status: DC | PRN

## 2012-06-28 MED ORDER — PROCHLORPERAZINE MALEATE 10 MG PO TABS: 10.0000 mg | ORAL_TABLET | Freq: Four times a day (QID) | ORAL | Status: DC | PRN

## 2012-06-28 MED ORDER — LIDOCAINE-PRILOCAINE 2.5-2.5 % EX CREA: TOPICAL_CREAM | CUTANEOUS | Status: DC | PRN

## 2012-06-28 MED ORDER — LORAZEPAM 0.5 MG PO TABS: 0.5000 mg | ORAL_TABLET | Freq: Four times a day (QID) | ORAL | Status: DC | PRN

## 2012-06-28 MED ORDER — DEXAMETHASONE 4 MG PO TABS: ORAL_TABLET | ORAL | Status: DC

## 2012-06-28 NOTE — Patient Instructions (Addendum)
#  1 you are seen in the multidisciplinary breast clinic today. We discussed your radiology, pathology, and treatment options.  #2 we discussed neoadjuvant chemotherapy consisting of Adriamycin Cytoxan given every 2 weeks for a total of 4 cycles followed by Taxol carboplatinum weekly for 12 weeks.  #3 we discussed genetic counseling.  #4 we discussed biopsying the lesions that were seen by ultrasound or MRI.  #5 you will need an echocardiogram, PET CT, chemotherapy teaching class.  #6 you will need a Port-A-Cath placed. Dr. Donell Beers will do this for Jaime Murillo.  #7 you will be seen back on 616 for followup and for cycle of chemotherapy.

## 2012-06-28 NOTE — Assessment & Plan Note (Signed)
Because of the multiple areas seen on MRI, we will get second ultrasound with biopsy versus MRI guided biopsy of the additional area. If the entire area of MRI abnormality is malignant, it would be difficult to do breast conservation therapy given her size.  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 During that time, she can see genetics and plastic surgery.  We will ascertain whether she will undergo mastectomy, mastectomy with reconstruction, or bilateral mastectomies with reconstruction.  We will place port a cath next week.  The surgery was reviewed, and the patient was shown a picture of the port a cath.     I reviewed risks of port a cath including bleeding, infection, damage to adjacent structures (pneumothorax), malfunction, malposition, and blood clot. The patient understands and wishes to proceed.   I will see her back mid treatment. We will refer her also for plastic surgery consult.    45 min spent with examination, evaluation, coordination of care, and counseling.  >50% spent in counseling.

## 2012-06-28 NOTE — Progress Notes (Signed)
Jaime Murillo [id number] [birth year] 51 y.o. 06/28/2012 3:52 PM  CC  Melford Aase, MD [facility] [address] [place] [address] Dr. Almond Lint Dr. 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 Patient is seen in the multidisciplinary breast clinic for discussion of treatment options.   STAGE:   Cancer of lower-inner quadrant of female breast   Primary site: Breast (Right)   Staging method: AJCC 7th Edition   Clinical: Stage IA (T1c, N0, cM0)   Summary: Stage IA (T1c, N0, cM0)  REFERRING PHYSICIAN: Dr. Almond Lint  HISTORY OF PRESENT ILLNESS:  Jaime Murillo is a 52 y.o. female.  Seen today for her discussion of new diagnosis of stage I breast cancer. Patient recently seen by her gynecologist who noted a palpable mass at the 3:00 position. She was sent to the breast Center on 06/20/2012. She proceeded to have a mammogram and an ultrasound that showed a 1.7 cm mass at 3:00. An ultrasound-guided biopsy revealed a ER negative PR negative HER-2/neu negative invasive ductal carcinoma with associated DCIS. Tumor was grade 3. MRI of the breasts performed on 06/26/2012 showed known biopsied mass at the 3:00 position but to other adjacent masses were also noted one at 2:00 measuring 0.8 x 1.6 x 2.2 cm and also a small 6 mm ill-defined mass posterior to the biopsy-proven malignancy lying just superficial to the pectoralis muscle. Her case was discussed at the multidisciplinary breast conference. She was also seen by Dr. Donell Beers and Dr. Chipper Herb in the multidisciplinary breast clinic. She is without any complaints. In   Past Medical History: Past Medical History  Diagnosis Date  . HSV infection   . NSVD (normal spontaneous vaginal delivery)   . Hypertension   . Breast cancer     Past Surgical History: Past Surgical History   Procedure Laterality Date  . Tubal ligation  2003    LTL    Family History: Family History  Problem Relation Age of Onset  . Heart disease Mother   . Hypertension Father   . Hypertension Sister   . Ovarian cancer Paternal Grandmother     Social History History  Substance Use Topics  . Smoking status: Never Smoker   . Smokeless tobacco: Never Used  . Alcohol Use: No    Allergies: No Known Allergies  Current Medications: Current Outpatient Prescriptions  Medication Sig Dispense Refill  . ferrous sulfate 325 (65 FE) MG tablet Take 325 mg by mouth daily with breakfast.      . hydrochlorothiazide (HYDRODIURIL) 25 MG tablet Take 1 tablet (25 mg total) by mouth daily.  30 tablet  11  . valACYclovir (VALTREX) 500 MG tablet Take 1 tablet (500 mg total) by mouth daily.  30 tablet  11  . Multiple Vitamin (MULTIVITAMIN) tablet Take 1 tablet by mouth daily.       No current facility-administered medications for this visit.    OB/GYN History: dear EJ 14 patient is premenopausal. She's had 2 term births,, first live birth was at 7.  Fertility Discussion:She has completed her family. Prior History of Cancer:no prior history  Health Maintenance:  Colonoscopy no Bone Density no Last PAP smear up to date  ECOG PERFORMANCE STATUS: 0 - Asymptomatic  Genetic Counseling/testing: patient will be referred to genetic counseling due to her family history  REVIEW OF SYSTEMS:  Comprehensive review of systems was  obtained and all 13 review of systems is recorded separately in the electronic medical record PHYSICAL EXAMINATION: Blood pressure 152/95, pulse 77, temperature 98.2 F (36.8 C), temperature source Oral, resp. rate 20, height 5' 3.25" (1.607 m), weight 116 lb 8 oz (52.844 kg), last menstrual period 05/20/2012. Patient is well-developed well-nourished female in no acute distress HEENT exam EOMI PERRLA sclerae anicteric no conjunctival pallor oral mucosa is moist neck is supple  lungs are clear bilaterally to auscultation cardiovascular is regular rate rhythm no murmurs abdomen is soft nontender nondistended bowel sounds are present no HSM extremities no edema neuro is nonfocal Left breast no masses nipple discharge right breast reveals a palpable nodule at the 3:00 position no other discrete masses are noted.      STUDIES/RESULTS: US Breast Right  06/20/2012   *RADIOLOGY REPORT*  Clinical Data:  52 year old female for annual bilateral mammograms and palpable mass in the upper and inner right breast discovered on clinical examination.  DIGITAL DIAGNOSTIC BILATERAL MAMMOGRAM WITH CAD AND RIGHT BREAST ULTRASOUND:  Comparison:  03/21/2009 and prior mammograms dating back to 01/21/2005  Findings:  ACR Breast Density Category 4: The breast tissue is extremely dense.  Routine and spot compression views of the right breast demonstrate no evidence of suspicious mass or distortion. Scattered punctate calcifications within the breasts bilaterally are again identified.  Mammographic images were processed with CAD.  On physical exam, thickening is identified at the 12 o'clock position of the right breast 4 cm from the nipple. A firm palpable mass identified at the 3 o'clock position of the right breast 4 cm from the nipple.  Ultrasound is performed, showing no evidence of solid or cystic mass, distortion or abnormal areas of shadowing at the 12 o'clock position of the right breast. A 7 x 6 x 17 mm slightly irregular oval hypoechoic mass with calcifications is horizontally oriented at the 3 o'clock position of the right breast 4 cm from the nipple.  IMPRESSION: Indeterminate 7 x 6 x 17 mm hypoechoic mass at the 3 o'clock position of the right breast.  Tissue sampling is recommended.  No mammographic evidence of breast malignancy bilaterally.  No mammographic, palpable or sonographic abnormality at the 12 o'clock position of the right breast, in the area of palpable concern.  BI-RADS CATEGORY 4:   Suspicious abnormality - biopsy should be considered.  RECOMMENDATION:  Ultrasound guided right breast biopsy, which will be performed today but dictated in a separate report.  I have discussed the findings and recommendations with the patient. Results were also provided in writing at the conclusion of the visit.  If applicable, a reminder letter will be sent to the patient regarding the next appointment.   Original Report Authenticated By: Harmon Pier, M.D.   Mr Breast Bilateral W Wo Contrast  06/26/2012   *RADIOLOGY REPORT*  Clinical Data: History of new diagnosis of breast cancer from recent ultrasound guided core needle biopsy right breast 06/20/2012 demonstrating invasive ductal carcinoma with DCIS.  GFR 89, creatinine 0.7 on 06/06/2012.  BILATERAL BREAST MRI WITH AND WITHOUT CONTRAST  Technique: Multiplanar, multisequence MR images of both breasts were obtained prior to and following the intravenous administration of 10ml of multihance.  Three dimensional images were evaluated at the independent DynaCad workstation.  Comparison:  Recent mammograms and ultrasound.  Findings: Examination demonstrates mild background parenchymal enhancement.  Right breast:  There is evidence of an ovoid gently lobulated and somewhat ill-defined heterogeneously enhancing mass over the deep third of the inner midportion of  the right breast representing patient's biopsy-proven malignancy.  This mass contains central clip artifact and measures approximately 0.9 x 1.9 x 1.0 cm. The anterior border of this mass is located 5.5 cm from the nipple. There is a 6 mm slightly ill-defined enhancing ovoid mass 6 mm posterior lateral to the biopsy-proven malignancy lying just superficial to the pectoralis muscle.  There is also clumped non mass enhancement anterior and slightly superior to the biopsy- proven malignancy in the middle third at approximately the 2 o'clock position.  This measures approximately 0.8 x 1.6 x 1.2 cm and is located 3  cm from the nipple.  This is suspicious for multifocal disease.  No suspicious axillary or internal mammary lymph nodes.  Left breast:  No suspicious mass, enhancement or adenopathy.  IMPRESSION: Known biopsy-proven malignancy over the deep third of the inner mid right breast.  6 mm ill-defined enhancing ovoid mass 6 mm posterior lateral to the biopsy-proven malignancy and lying just superficial to the pectoralis muscle.  Clumped non mass enhancement anterior and superior to patient's known malignancy at approximately the 2 o'clock position measuring 0.8 x 1.6 x 2.2 cm.  Findings likely represent multi focal disease.  RECOMMENDATION: Recommend second look ultrasound and core biopsy if feasible of the clumped non mass enhancement at approximately the 2 o'clock position right breast anterior/superior to the known malignancy. If not seen by ultrasound, recommend proceeding to MR guided biopsy of this suspicious abnormality.  THREE-DIMENSIONAL MR IMAGE RENDERING ON INDEPENDENT WORKSTATION:  Three-dimensional MR images were rendered by post-processing of the original MR data on an independent workstation.  The three- dimensional MR images were interpreted, and findings were reported in the accompanying complete MRI report for this study.  BI-RADS CATEGORY 0:  Incomplete.  Need additional imaging evaluation and/or prior mammograms for comparison.   Original Report Authenticated By: Elberta Fortis, M.D.   Mm Digital Diagnostic Bilat  06/20/2012   *RADIOLOGY REPORT*  Clinical Data:  52 year old female for annual bilateral mammograms and palpable mass in the upper and inner right breast discovered on clinical examination.  DIGITAL DIAGNOSTIC BILATERAL MAMMOGRAM WITH CAD AND RIGHT BREAST ULTRASOUND:  Comparison:  03/21/2009 and prior mammograms dating back to 01/21/2005  Findings:  ACR Breast Density Category 4: The breast tissue is extremely dense.  Routine and spot compression views of the right breast demonstrate no  evidence of suspicious mass or distortion. Scattered punctate calcifications within the breasts bilaterally are again identified.  Mammographic images were processed with CAD.  On physical exam, thickening is identified at the 12 o'clock position of the right breast 4 cm from the nipple. A firm palpable mass identified at the 3 o'clock position of the right breast 4 cm from the nipple.  Ultrasound is performed, showing no evidence of solid or cystic mass, distortion or abnormal areas of shadowing at the 12 o'clock position of the right breast. A 7 x 6 x 17 mm slightly irregular oval hypoechoic mass with calcifications is horizontally oriented at the 3 o'clock position of the right breast 4 cm from the nipple.  IMPRESSION: Indeterminate 7 x 6 x 17 mm hypoechoic mass at the 3 o'clock position of the right breast.  Tissue sampling is recommended.  No mammographic evidence of breast malignancy bilaterally.  No mammographic, palpable or sonographic abnormality at the 12 o'clock position of the right breast, in the area of palpable concern.  BI-RADS CATEGORY 4:  Suspicious abnormality - biopsy should be considered.  RECOMMENDATION:  Ultrasound guided right breast biopsy,  which will be performed today but dictated in a separate report.  I have discussed the findings and recommendations with the patient. Results were also provided in writing at the conclusion of the visit.  If applicable, a reminder letter will be sent to the patient regarding the next appointment.   Original Report Authenticated By: Harmon Pier, M.D.   Mm Digital Diagnostic Unilat R  06/20/2012   *RADIOLOGY REPORT*  Clinical Data:  52 year old female - evaluate clip placement following ultrasound guided right breast biopsy.  DIGITAL DIAGNOSTIC RIGHT MAMMOGRAM  Comparison:  Previous exams.  Findings:  Films are performed following ultrasound guided biopsy of the 7 x 6 x 17 mm hypoechoic mass at the 3 o'clock position of the right breast 4 cm from the  nipple. The T shaped biopsy clip is in satisfactory position.  IMPRESSION: Satisfactory clip placement following ultrasound guided right breast biopsy.  Pathology will be followed.   Original Report Authenticated By: Harmon Pier, M.D.   [place] Rt Breast Bx W Loc Dev 1st Lesion Img Bx Spec US Guide  06/21/2012   *RADIOLOGY REPORT*  Clinical Data:  Indeterminate 7 x 6 x 17 mm hypoechoic mass in the inner right breast - for tissue sampling.  ULTRASOUND GUIDED VACUUM ASSISTED CORE BIOPSY OF THE RIGHT BREAST  Comparison: Previous exams.  I met with the patient and we discussed the procedure of ultrasound- guided biopsy, including benefits and alternatives.  We discussed the high likelihood of a successful procedure. We discussed the risks of the procedure including infection, bleeding, tissue injury, clip migration, and inadequate sampling.  Informed written consent was given.  Using sterile technique, 2% lidocaine ultrasound guidance and a 12 gauge vacuum assisted needle biopsy was performed of the 7 x 6 x 17 mm hypoechoic mass at the 3 o'clock position of the right breast 4 cm from the nipple using a lateral approach.  At the conclusion of the procedure, a T shaped tissue marker clip was deployed into the biopsy cavity.  Follow-up 2-view mammogram was performed and dictated separately.  IMPRESSION: Ultrasound-guided biopsy of right breast mass.  No apparent complications.  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 Histology correlates with imaging findings.  The patient was contacted by phone on 06/21/2012 and these results given to her which she understood. Her questions were answered. She was encouraged to obtain breast cancer educational material from the Breast Center at her convenience. The patient had no complaints with her biopsy site.  Recommend surgery/oncology consultation.  An appointment at the Multidisciplinary Clinic has been scheduled for 06/28/2012. Recommend bilateral breast MRI,  which has been scheduled for 06/26/2012. The patient was informed of these appointments.   Original Report Authenticated By: Harmon Pier, M.D.     LABS:    Chemistry      Component Value Date/Time   NA 139 06/28/2012 1231   NA 137 06/06/2012 0853   K 3.5 06/28/2012 1231   K 3.8 06/06/2012 0853   CL 104 06/28/2012 1231   CL 102 06/06/2012 0853   CO2 29 06/28/2012 1231   CO2 26 06/06/2012 0853   BUN 10.0 06/28/2012 1231   BUN 12 06/06/2012 0853   CREATININE 0.8 06/28/2012 1231   CREATININE 0.70 06/06/2012 0853   CREATININE 0.85 04/24/2009 1114      Component Value Date/Time   CALCIUM 9.3 06/28/2012 1231   CALCIUM 9.3 06/06/2012 0853   ALKPHOS 44 06/28/2012 1231   ALKPHOS 35* 06/06/2012 0853   AST 15 06/28/2012  1231   AST 14 06/06/2012 0853   ALT 7 06/28/2012 1231   ALT 9 06/06/2012 0853   BILITOT 0.92 06/28/2012 1231   BILITOT 0.8 06/06/2012 0853      Lab Results  Component Value Date   WBC 4.1 06/28/2012   HGB 12.3 06/28/2012   HCT 36.3 06/28/2012   MCV 94.8 06/28/2012   PLT 256 06/28/2012   PATHOLOGY: ADDITIONAL INFORMATION: PROGNOSTIC INDICATORS - ACIS Results: IMMUNOHISTOCHEMICAL AND MORPHOMETRIC ANALYSIS BY THE AUTOMATED CELLULAR IMAGING SYSTEM (ACIS) Estrogen Receptor: 0%, NEGATIVE Progesterone Receptor: 0%, NEGATIVE Proliferation Marker Ki67: 95% COMMENT: The negative hormone receptor study(ies) in this case have an internal positive control. REFERENCE RANGE ESTROGEN RECEPTOR NEGATIVE <1% POSITIVE =>1% PROGESTERONE RECEPTOR NEGATIVE <1% POSITIVE =>1% All controls stained appropriately Pecola Leisure MD Pathologist, Electronic Signature ( Signed 06/28/2012) CHROMOGENIC IN-SITU HYBRIDIZATION Results: HER-2/NEU BY CISH - NO AMPLIFICATION OF HER-2 DETECTED. RESULT RATIO OF HER2: CEP 17 SIGNALS 1.26 1 of 3 Duplicate copy FINAL for Jaime, Murillo [phone]) ADDITIONAL INFORMATION:(continued) AVERAGE HER2 COPY NUMBER PER CELL 2.45 REFERENCE RANGE NEGATIVE HER2/Chr17 Ratio <2.0 and  Average HER2 copy number <4.0 EQUIVOCAL HER2/Chr17 Ratio <2.0 and Average HER2 copy number 4.0 and <6.0 POSITIVE HER2/Chr17 Ratio >=2.0 and/or Average HER2 copy number >=6.0 Jimmy Picket MD Pathologist, Electronic Signature  ASSESSMENT    52 year old female with  #1 stage I (T1 NX MX) invasive ductal carcinoma high-grade ER negative PR negative HER-2/neu negative with a Ki-67 at 95% found on clinical breast examination with followup mammogram and MRI. Patient is a good candidate for breast conservation. Therefore she is seen in the multidisciplinary breast clinic for discussion of her treatment options. Patient does have other masses which are concerning. Certainly they will need to be biopsied and we will get her set up for this.  #2 if 30 other masses are negative then she would be a good candidate for a lumpectomy We discussed types of chemotherapy  to day which would include for triple-negative Adriamycin Cytoxan given every 2 weeks for a total of 4 cycles with day 2 Neulasta. This would then be followed by Taxol carboplatinum weekly for 12 weeks.We did discuss giving her the treatment in the neoadjuvant setting. To try to shrink her tumor down so that she can undergo a lumpectomy. She will need a Port-A-Cath placed and Dr. Donell Beers will do this for [place].  #3 patient will also need staging studies initially with PET scan and CT scan.  #4 she will need chemotherapy class echocardiogram and genetics referral.  #5 patient was seen by Dr. Chipper Herb and I do think that if she is successfully has a lumpectomy after her neoadjuvant chemotherapy she would be a good candidate for radiation therapy.  Clinical Trial Eligibility: no Multidisciplinary conference discussion yes     PLAN:    #1 proceed with neoadjuvant chemotherapy after she has her Port-A-Cath placed.  #2 we will set her up for a PET/CT for initial staging.  #3 she will need chemotherapy class echocardiogram.  #4 we will get  her seen by Maylon Cos our genetic counselor.        Discussion: Patient is being treated per NCCN breast cancer care guidelines appropriate for stage.I   Thank you so much for allowing me to participate in the care of Jaime Murillo. I will continue to follow up the patient with you and assist in her care.  All questions were answered. The patient knows to call the clinic with any problems, questions  or concerns. We can certainly see the patient much sooner if necessary.  I spent 55 minutes counseling the patient face to face. The total time spent in the appointment was 60 minutes.  Drue Second, MD Medical/Oncology [facility] [phone] (beeper) [phone] (Office)  06/28/2012, 3:52 PM

## 2012-06-28 NOTE — Patient Instructions (Signed)
Implanted Port Instructions  An implanted port is a central line that has a round shape and is placed under the skin. It is used for long-term IV (intravenous) access for:  · Medicine.  · Fluids.  · Liquid nutrition, such as TPN (total parenteral nutrition).  · Blood samples.  Ports can be placed:  · In the chest area just below the collarbone (this is the most common place.)  · In the arms.  · In the belly (abdomen) area.  · In the legs.  PARTS OF THE PORT  A port has 2 main parts:  · The reservoir. The reservoir is round, disc-shaped, and will be a small, raised area under your skin.  · The reservoir is the part where a needle is inserted (accessed) to either give medicines or to draw blood.  · The catheter. The catheter is a long, slender tube that extends from the reservoir. The catheter is placed into a large vein.  · Medicine that is inserted into the reservoir goes into the catheter and then into the vein.  INSERTION OF THE PORT  · The port is surgically placed in either an operating room or in a procedural area (interventional radiology).  · Medicine may be given to help you relax during the procedure.  · The skin where the port will be inserted is numbed (local anesthetic).  · 1 or 2 small cuts (incisions) will be made in the skin to insert the port.  · The port can be used after it has been inserted.  INCISION SITE CARE  · The incision site may have small adhesive strips on it. This helps keep the incision site closed. Sometimes, no adhesive strips are placed. Instead of adhesive strips, a special kind of surgical glue is used to keep the incision closed.  · If adhesive strips were placed on the incision sites, do not take them off. They will fall off on their own.  · The incision site may be sore for 1 to 2 days. Pain medicine can help.  · Do not get the incision site wet. Bathe or shower as directed by your caregiver.  · The incision site should heal in 5 to 7 days. A small scar may form after the  incision has healed.  ACCESSING THE PORT  Special steps must be taken to access the port:  · Before the port is accessed, a numbing cream can be placed on the skin. This helps numb the skin over the port site.  · A sterile technique is used to access the port.  · The port is accessed with a needle. Only "non-coring" port needles should be used to access the port. Once the port is accessed, a blood return should be checked. This helps ensure the port is in the vein and is not clogged (clotted).  · If your caregiver believes your port should remain accessed, a clear (transparent) bandage will be placed over the needle site. The bandage and needle will need to be changed every week or as directed by your caregiver.  · Keep the bandage covering the needle clean and dry. Do not get it wet. Follow your caregiver's instructions on how to take a shower or bath when the port is accessed.  · If your port does not need to stay accessed, no bandage is needed over the port.  FLUSHING THE PORT  Flushing the port keeps it from getting clogged. How often the port is flushed depends on:  · If a   constant infusion is running. If a constant infusion is running, the port may not need to be flushed.  · If intermittent medicines are given.  · If the port is not being used.  For intermittent medicines:  · The port will need to be flushed:  · After medicines have been given.  · After blood has been drawn.  · As part of routine maintenance.  · A port is normally flushed with:  · Normal saline.  · Heparin.  · Follow your caregiver's advice on how often, how much, and the type of flush to use on your port.  IMPORTANT PORT INFORMATION  · Tell your caregiver if you are allergic to heparin.  · After your port is placed, you will get a manufacturer's information card. The card has information about your port. Keep this card with you at all times.  · There are many types of ports available. Know what kind of port you have.  · In case of an  emergency, it may be helpful to wear a medical alert bracelet. This can help alert health care workers that you have a port.  · The port can stay in for as long as your caregiver believes it is necessary.  · When it is time for the port to come out, surgery will be done to remove it. The surgery will be similar to how the port was put in.  · If you are in the hospital or clinic:  · Your port will be taken care of and flushed by a nurse.  · If you are at home:  · A home health care nurse may give medicines and take care of the port.  · You or a family member can get special training and directions for giving medicine and taking care of the port at home.  SEEK IMMEDIATE MEDICAL CARE IF:   · Your port does not flush or you are unable to get a blood return.  · New drainage or pus is coming from the incision.  · A bad smell is coming from the incision site.  · You develop swelling or increased redness at the incision site.  · You develop increased swelling or pain at the port site.  · You develop swelling or pain in the surrounding skin near the port.  · You have an oral temperature above 102° F (38.9° C), not controlled by medicine.  MAKE SURE YOU:   · Understand these instructions.  · Will watch your condition.  · Will get help right away if you are not doing well or get worse.  Document Released: 01/11/2005 Document Revised: 04/05/2011 Document Reviewed: 04/04/2008  [employer]® Patient Information ©2014 [employer].

## 2012-06-28 NOTE — Progress Notes (Signed)
Chief complaint:  Right breast cancer  HISTORY: Jaime Murillo is a 52-year-old female who presents with a new diagnosis of right breast cancer. She is seen at the request of Dr. Juan Fernandez.  She presented with a palpable mass. She was seen on mammogram and ultrasound have a 1.7 cm mass at 3:00 of the right breast. She subsequently underwent MRI and was seen to have 3 areas of concern that were all close, but separate. There was a 6 mm area posterior to the original area of biopsy as well as a 1.6 cm area that is slightly superior and anterior.  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  She has not ever had a history of breast problems in the past. She has not gotten the past few mammograms. She does have a family history of cancer with a grandmother on her father's side having ovarian cancer in a grandmother mother side having stomach cancer. Her brother has lung cancer. She does have 2 children age 21 and 11. She does not smoke or drink alcohol. She worked for [employer] until recently.  Menarche was age 19. She is still having periods. These are regular. She had her first child at age 31 and has used hormone treatment for contraception. She has not had a colonoscopy or bone density study. She is up-to-date with her Pap smears.  Past Medical History  Diagnosis Date  . HSV infection   . NSVD (normal spontaneous vaginal delivery)   . Hypertension   . Breast cancer     Past Surgical History  Procedure Laterality Date  . Tubal ligation  2003    LTL    Current Outpatient Prescriptions  Medication Sig Dispense Refill  . ferrous sulfate 325 (65 FE) MG tablet Take 325 mg by mouth daily with breakfast.      . hydrochlorothiazide (HYDRODIURIL) 25 MG tablet Take 1 tablet (25 mg total) by mouth daily.  30 tablet  11  . Multiple Vitamin (MULTIVITAMIN) tablet Take 1 tablet by mouth daily.      . valACYclovir (VALTREX) 500 MG tablet Take 1 tablet (500 mg  total) by mouth daily.  30 tablet  11   No current facility-administered medications for this visit.     No Known Allergies   Family History  Problem Relation Age of Onset  . Heart disease Mother   . Hypertension Father   . Hypertension Sister   . Ovarian cancer Paternal Grandmother   Stomach cancer in maternal grandmother    History   Social History  . Marital Status: Single    Spouse Name: N/A    Number of Children: N/A  . Years of Education: N/A   Social History Main Topics  . Smoking status: Never Smoker   . Smokeless tobacco: Never Used  . Alcohol Use: No  . Drug Use: No  . Sexually Active: Yes     Comment: BTL    REVIEW OF SYSTEMS - PERTINENT POSITIVES ONLY: 12 point review of systems negative other than HPI and PMH except for contacts, skin rash  EXAM: Wt Readings from Last 3 Encounters:  06/28/12 116 lb 8 oz (52.844 kg)  06/06/12 118 lb (53.524 kg)  05/05/11 115 lb (52.164 kg)   Temp Readings from Last 3 Encounters:  06/28/12 98.2 F (36.8 C) Oral  11/28/11 98 F (36.7 C) Oral   BP Readings from Last 3 Encounters:  06/28/12 152/95  06/06/12 138/86  11/28/11 171/96     Pulse Readings from Last 3 Encounters:  06/28/12 77  11/28/11 60    Gen:  No acute distress.  Well nourished and well groomed.   Neurological: Alert and oriented to person, place, and time. Coordination normal.  Head: Normocephalic and atraumatic.  Eyes: Conjunctivae are normal. Pupils are equal, round, and reactive to light. No scleral icterus.  Neck: Normal range of motion. Neck supple. No tracheal deviation or thyromegaly present.  Cardiovascular: Normal rate, regular rhythm, normal heart sounds and intact distal pulses.  Exam reveals no gallop and no friction rub.  No murmur heard. Breast:  Palpable mass on right at 3 oclock.  1.5 cm.  Small palpable lymph node on right.  Left breast without masses or LAD>  No nipple discharge or skin dimpling.   Respiratory: Effort normal.   No respiratory distress. No chest wall tenderness. Breath sounds normal.  No wheezes, rales or rhonchi.  GI: Soft. Bowel sounds are normal. The abdomen is soft and nontender.  There is no rebound and no guarding.  Musculoskeletal: Normal range of motion. Extremities are nontender.  Lymphadenopathy: No cervical, preauricular, postauricular or axillary adenopathy is present Skin: Skin is warm and dry. No rash noted. No diaphoresis. No erythema. No pallor. No clubbing, cyanosis, or edema.   Psychiatric: Normal mood and affect. Behavior is normal. Judgment and thought content normal.    LABORATORY RESULTS: Available labs are reviewed  CBC, CMET normal  RADIOLOGY RESULTS: See E-Chart or I-Site for most recent results.  Images and reports are reviewed. MRI IMPRESSION:  Known biopsy-proven malignancy over the deep third of the inner mid  right breast. 6 mm ill-defined enhancing ovoid mass 6 mm posterior  lateral to the biopsy-proven malignancy and lying just superficial  to the pectoralis muscle. Clumped non mass enhancement anterior  and superior to Jaime Murillo's known malignancy at approximately the 2  o'clock position measuring 0.8 x 1.6 x 2.2 cm. Findings likely  represent multi focal disease.  Mammo/ultrasound IMPRESSION:  Indeterminate 7 x 6 x 17 mm hypoechoic mass at the 3 o'clock  position of the right breast. Tissue sampling is recommended.  No mammographic evidence of breast malignancy bilaterally.  No mammographic, palpable or sonographic abnormality at the 12  o'clock position of the right breast, in the area of palpable  concern.  BI-RADS CATEGORY 4: Suspicious abnormality - biopsy should be  considered.     ASSESSMENT AND PLAN: Cancer of lower-inner quadrant of female breast Because of the multiple areas seen on MRI, we will get second ultrasound with biopsy versus MRI guided biopsy of the additional area. If the entire area of MRI abnormality is malignant, it would be  difficult to do breast conservation therapy given her size.  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 During that time, she can see genetics and plastic surgery.  We will ascertain whether she will undergo mastectomy, mastectomy with reconstruction, or bilateral mastectomies with reconstruction.  We will place port a cath next week.  The surgery was reviewed, and the Jaime Murillo was shown a picture of the port a cath.     I reviewed risks of port a cath including bleeding, infection, damage to adjacent structures (pneumothorax), malfunction, malposition, and blood clot. The Jaime Murillo understands and wishes to proceed.   I will see her back mid treatment. We will refer her also for plastic surgery consult.    45 min spent with examination, evaluation, coordination of care, and counseling.  >50%   spent in counseling.       Jaime Murillo L Jaime Pevehouse MD Surgical Oncology, General and Endocrine Surgery [facility]      Visit Diagnoses: 1. Cancer of lower-inner quadrant of female breast, right     Primary Care Physician: VAN HAASTEREN,LORETTA, MD Radiation :  Dr. Murray Oncology:  Dr. Khan OB:  Dr. Juan Fernandez    

## 2012-06-28 NOTE — Progress Notes (Signed)
[facility] Radiation Oncology NEW PATIENT EVALUATION  Name: Jaime Murillo MRN: [id number]  Date:   06/28/2012           DOB: 04/23/1960  Status: outpatient   CC: Melford Aase, MD  Almond Lint, MD Dr. Reynaldo Minium   REFERRING PHYSICIAN: Almond Lint, MD   DIAGNOSIS: Clinical stage I (T1, N0, M0) triple negative invasive ductal carcinoma of the right breast   HISTORY OF PRESENT ILLNESS:  Jaime Murillo is a 52 y.o. female who is seen today at the [facility]  through the courtesy of Dr. Donell Beers for evaluation of her triple negative T1 N0 invasive ductal carcinoma of the right breast. Dr. Lily Peer noted a palpable mass at 3:00. [facility]2012 at which time she had a mammogram and ultrasound showing a 1.7 cm mass at 3:00. 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 The tumor was grade III. Subsequent MRI on 06/26/2012 showed the known biopsied mass at 3:00, but two other adjacent masses, one at 2:00 measuring 0.8 x 1.6 x 2.2 cm and also a 6 mm ill-defined mass posterior to the biopsy-proven malignancy line just superficial to the pectoralis muscle. She seen today with Dr. Donell Beers and Dr. Welton Flakes.  PREVIOUS RADIATION THERAPY: No   PAST MEDICAL HISTORY:  has a past medical history of HSV infection; NSVD (normal spontaneous vaginal delivery); Hypertension; and Breast cancer.     PAST SURGICAL HISTORY:  Past Surgical History  Procedure Laterality Date  . Tubal ligation  2003    LTL     FAMILY HISTORY: family history includes Heart disease in her mother; Hypertension in her father and sister; and Ovarian cancer in her paternal grandmother. Her father is alive and well at 33 with a history of treated prostate cancer. Her mother died following a brain aneurysm rupture at age 40.   SOCIAL HISTORY:  reports that she has never smoked. She has never used smokeless tobacco. She reports  that she does not drink alcohol or use illicit drugs. Engaged, 2 children. She is currently unemployed but working Herbalist for [employer]   ALLERGIES: Review of patient's allergies indicates no known allergies.   MEDICATIONS:  Current Outpatient Prescriptions  Medication Sig Dispense Refill  . ferrous sulfate 325 (65 FE) MG tablet Take 325 mg by mouth daily with breakfast.      . hydrochlorothiazide (HYDRODIURIL) 25 MG tablet Take 1 tablet (25 mg total) by mouth daily.  30 tablet  11  . Multiple Vitamin (MULTIVITAMIN) tablet Take 1 tablet by mouth daily.      . valACYclovir (VALTREX) 500 MG tablet Take 1 tablet (500 mg total) by mouth daily.  30 tablet  11   No current facility-administered medications for this encounter.     REVIEW OF SYSTEMS:  Pertinent items are noted in HPI.   Physical examination: Alert and oriented 52 year old after American female appearing her stated age. Wt Readings from Last 3 Encounters:  06/28/12 116 lb 8 oz (52.844 kg)  06/06/12 118 lb (53.524 kg)  05/05/11 115 lb (52.164 kg)   Temp Readings from Last 3 Encounters:  06/28/12 98.2 F (36.8 C) Oral  11/28/11 98 F (36.7 C) Oral   BP Readings from Last 3 Encounters:  06/28/12 152/95  06/06/12 138/86  11/28/11 171/96   Pulse Readings from Last 3 Encounters:  06/28/12 77  11/28/11 60    Head and neck examination: Grossly  unremarkable. Nodes: There is no palpable cervical, supraclavicular, or axillary lymphadenopathy. There are a few subcentimeter nodes within the right axilla. Chest: Lungs clear. Heart: Regular rate and rhythm. Back: Without spinal or CVA tenderness. Breasts: There is a biopsy wound at approximately 2:00 with a 2 cm mass palpable at 3:00. There is ill-defined nodularity extending from 3 to approximately 6:00 along the lower quadrant of the right breast. Left breast without discrete masses. Abdomen: Without hepatomegaly. Extremities: Without edema.  LABORATORY DATA:  Lab Results   Component Value Date   WBC 4.1 06/28/2012   HGB 12.3 06/28/2012   HCT 36.3 06/28/2012   MCV 94.8 06/28/2012   PLT 256 06/28/2012   Lab Results  Component Value Date   NA 139 06/28/2012   K 3.5 06/28/2012   CL 104 06/28/2012   CO2 29 06/28/2012   Lab Results  Component Value Date   ALT 7 06/28/2012   AST 15 06/28/2012   ALKPHOS 44 06/28/2012   BILITOT 0.92 06/28/2012      IMPRESSION: Stage I (T1, N0, M0) triple negative invasive ductal carcinoma of the right breast. She will require further diagnostic evaluation of her right breast masses seen on MRI. She will initially have ultrasound and then MR guided biopsies if these masses were not clearly seen on ultrasound. She appears to be borderline for breast preservation unless she has an excellent response to neoadjuvant chemotherapy. Following her diagnostic evaluation, our plan is to proceed with neoadjuvant chemotherapy. We can decide on the role of radiation therapy following definitive surgery whether not she has a partial mastectomy or mastectomy. I briefly discussed the potential acute and late toxicities of possible radiation therapy. She also see genetic counseling for possible genetic testing.   PLAN: As discussed above.  I spent 30 minutes minutes face to face with the patient and more than 50% of that time was spent in counseling and/or coordination of care.

## 2012-06-28 NOTE — Progress Notes (Signed)
Checked in new pt with no insurance.  Her insurance ended on 06/25/12.  I gave her a Medicaid application and advised to apply for the affordable care act.  She has a waiting period before she can apply to both.

## 2012-06-29 ENCOUNTER — Telehealth: Payer: Self-pay | Admitting: *Deleted

## 2012-06-29 ENCOUNTER — Ambulatory Visit
Admission: RE | Admit: 2012-06-29 | Discharge: 2012-06-29 | Disposition: A | Discharge status: Home / Self Care | Payer: Managed Care, Other (non HMO) | Source: Ambulatory Visit | Attending: Gynecology | Admitting: Gynecology

## 2012-06-29 ENCOUNTER — Encounter: Payer: Self-pay | Admitting: *Deleted

## 2012-06-29 DIAGNOSIS — R928 Other abnormal and inconclusive findings on diagnostic imaging of breast: Secondary | ICD-10-CM

## 2012-06-29 NOTE — Telephone Encounter (Signed)
Spoke to pt concerning [facility] from 06/28/12.  Pt request to start chemotherapy treatment the week of 07/17/12 d/t her birthday and events planned around it.  Pt denies questions or concerns regarding dx or treatment care plan.  Confirmed future appts.  Informed pt that we will schedule her with f/u appt with Dr. Welton Flakes the week of 07/10/12 to discuss anti-nausea medications and chemotherapy regimen and start chemotherapy the week of 07/17/12.  Received verbal understanding.  Informed pt med onc schedulers will be calling to schedule chemotherapy class and f/u with Dr. Welton Flakes.  Discussed our recommendations for manicures and pedicures with chemotherapy.  Encourage pt to call with further needs.  Contact information given.

## 2012-06-30 ENCOUNTER — Other Ambulatory Visit: Payer: Self-pay | Admitting: Gynecology

## 2012-06-30 ENCOUNTER — Telehealth: Payer: Self-pay | Admitting: Oncology

## 2012-06-30 ENCOUNTER — Telehealth: Payer: Self-pay | Admitting: Medical Oncology

## 2012-06-30 DIAGNOSIS — R928 Other abnormal and inconclusive findings on diagnostic imaging of breast: Secondary | ICD-10-CM

## 2012-06-30 NOTE — Telephone Encounter (Signed)
, °

## 2012-06-30 NOTE — Telephone Encounter (Signed)
Deirdra from  Radiology called to inform office that pt's insurance has denied PET scan, therefore PET scan has been cancelled. CT scan approved and scheduled for 07/03/12 @ 3pm  Lilyan Punt in Managed care aware.   Mssg forwarded to MD.

## 2012-07-03 ENCOUNTER — Encounter (HOSPITAL_COMMUNITY): Payer: Managed Care, Other (non HMO)

## 2012-07-03 ENCOUNTER — Encounter: Payer: Self-pay | Admitting: Oncology

## 2012-07-03 ENCOUNTER — Ambulatory Visit (HOSPITAL_COMMUNITY)
Admission: RE | Admit: 2012-07-03 | Discharge: 2012-07-03 | Disposition: A | Discharge status: Home / Self Care | Payer: Managed Care, Other (non HMO) | Source: Ambulatory Visit | Attending: Oncology | Admitting: Oncology

## 2012-07-03 ENCOUNTER — Encounter (HOSPITAL_COMMUNITY): Payer: Self-pay

## 2012-07-03 DIAGNOSIS — Z01818 Encounter for other preprocedural examination: Secondary | ICD-10-CM | POA: Insufficient documentation

## 2012-07-03 DIAGNOSIS — Z8249 Family history of ischemic heart disease and other diseases of the circulatory system: Secondary | ICD-10-CM | POA: Insufficient documentation

## 2012-07-03 DIAGNOSIS — C50311 Malignant neoplasm of lower-inner quadrant of right female breast: Secondary | ICD-10-CM

## 2012-07-03 DIAGNOSIS — I079 Rheumatic tricuspid valve disease, unspecified: Secondary | ICD-10-CM | POA: Insufficient documentation

## 2012-07-03 DIAGNOSIS — M51379 Other intervertebral disc degeneration, lumbosacral region without mention of lumbar back pain or lower extremity pain: Secondary | ICD-10-CM | POA: Insufficient documentation

## 2012-07-03 DIAGNOSIS — M5137 Other intervertebral disc degeneration, lumbosacral region: Secondary | ICD-10-CM | POA: Insufficient documentation

## 2012-07-03 DIAGNOSIS — Z01810 Encounter for preprocedural cardiovascular examination: Secondary | ICD-10-CM | POA: Insufficient documentation

## 2012-07-03 DIAGNOSIS — I10 Essential (primary) hypertension: Secondary | ICD-10-CM | POA: Insufficient documentation

## 2012-07-03 DIAGNOSIS — Z171 Estrogen receptor negative status [ER-]: Secondary | ICD-10-CM | POA: Insufficient documentation

## 2012-07-03 DIAGNOSIS — I059 Rheumatic mitral valve disease, unspecified: Secondary | ICD-10-CM | POA: Insufficient documentation

## 2012-07-03 DIAGNOSIS — C50319 Malignant neoplasm of lower-inner quadrant of unspecified female breast: Secondary | ICD-10-CM | POA: Insufficient documentation

## 2012-07-03 HISTORY — PX: TRANSTHORACIC ECHOCARDIOGRAM: SHX275

## 2012-07-03 MED ORDER — IOHEXOL 300 MG/ML  SOLN
80.0000 mL | Freq: Once | INTRAMUSCULAR | Status: AC | PRN
  Administered 2012-07-03: 80 mL via INTRAVENOUS

## 2012-07-03 MED ORDER — IOHEXOL 300 MG/ML  SOLN
50.0000 mL | Freq: Once | INTRAMUSCULAR | Status: AC | PRN
  Administered 2012-07-03: 50 mL via ORAL

## 2012-07-03 NOTE — Progress Notes (Unsigned)
07/03/2012 I have spoken with three individuals @ [facility], the last person being [place], Charity fundraiser. This particular [employer] policy does require the patient to have CT SCANS and the results faxed to Reconsideration before a PET SCAN is authorized.  The patient is having these scan today and I will fax the new clinical information ASAP after it is posted.  Renee Ramus

## 2012-07-03 NOTE — Progress Notes (Signed)
  Echocardiogram 2D Echocardiogram has been performed.  Cathie Beams 07/03/2012, 1:39 PM

## 2012-07-04 ENCOUNTER — Encounter (HOSPITAL_COMMUNITY): Payer: Self-pay | Admitting: Pharmacy Technician

## 2012-07-04 ENCOUNTER — Encounter (HOSPITAL_COMMUNITY): Payer: Self-pay

## 2012-07-04 ENCOUNTER — Telehealth: Payer: Self-pay | Admitting: *Deleted

## 2012-07-04 ENCOUNTER — Encounter (HOSPITAL_COMMUNITY)
Admission: RE | Admit: 2012-07-04 | Discharge: 2012-07-04 | Disposition: A | Discharge status: Home / Self Care | Payer: Managed Care, Other (non HMO) | Source: Ambulatory Visit | Attending: General Surgery | Admitting: General Surgery

## 2012-07-04 ENCOUNTER — Encounter: Payer: Self-pay | Admitting: Oncology

## 2012-07-04 ENCOUNTER — Ambulatory Visit (HOSPITAL_COMMUNITY)
Admission: RE | Admit: 2012-07-04 | Discharge: 2012-07-04 | Disposition: A | Discharge status: Home / Self Care | Payer: Managed Care, Other (non HMO) | Source: Ambulatory Visit | Attending: Anesthesiology | Admitting: Anesthesiology

## 2012-07-04 ENCOUNTER — Encounter: Payer: Self-pay | Admitting: *Deleted

## 2012-07-04 DIAGNOSIS — Z01818 Encounter for other preprocedural examination: Secondary | ICD-10-CM | POA: Insufficient documentation

## 2012-07-04 DIAGNOSIS — R9431 Abnormal electrocardiogram [ECG] [EKG]: Secondary | ICD-10-CM | POA: Insufficient documentation

## 2012-07-04 DIAGNOSIS — I10 Essential (primary) hypertension: Secondary | ICD-10-CM | POA: Insufficient documentation

## 2012-07-04 DIAGNOSIS — C50919 Malignant neoplasm of unspecified site of unspecified female breast: Secondary | ICD-10-CM | POA: Insufficient documentation

## 2012-07-04 DIAGNOSIS — Z01810 Encounter for preprocedural cardiovascular examination: Secondary | ICD-10-CM | POA: Insufficient documentation

## 2012-07-04 DIAGNOSIS — Z01812 Encounter for preprocedural laboratory examination: Secondary | ICD-10-CM | POA: Insufficient documentation

## 2012-07-04 HISTORY — DX: Frequency of micturition: R35.0

## 2012-07-04 HISTORY — DX: Anemia, unspecified: D64.9

## 2012-07-04 LAB — SURGICAL PCR SCREEN: MRSA, PCR: NEGATIVE

## 2012-07-04 NOTE — Telephone Encounter (Signed)
Pt called to inform that her chemo class will need to be r/s due to change in port insertion time.  Sent schedulers POF to r/s chemo class.  Pt denies further questions or concerns.  Encourage pt to call with needs. Contact information given.

## 2012-07-04 NOTE — Progress Notes (Unsigned)
07/04/2012  Per the conversation I had with the nurse reviewer @ [employer], I have submitted for RECONSIDERATION the PET SCAN request with clinical reports of this patient's CT SCANS done 07/03/2012.  Bonita Quin [phone]

## 2012-07-04 NOTE — Progress Notes (Signed)
Pt doesn't have a cardiologist  Echo report in epic from 07/03/12  Denies ever having a heart cath or stress test  Gyn is who pt sees Dr.Juan Fernandez;doesn't have medical md  Denies ekg or cxr in past yr

## 2012-07-04 NOTE — Pre-Procedure Instructions (Signed)
Jaime Murillo  07/04/2012   Your procedure is scheduled on:  Thurs, June 12 @ 2:15 PM  Report to [facility] at 12:15 PM.  Call this number if you have problems the morning of surgery: [phone]   Remember:   Do not eat food or drink liquids after midnight.   Take these medicines the morning of surgery with A SIP OF WATER: Ativan(Lorazepam),Valcyclovir(Valtrex),and Compazine(Prochlorperazine-if needed)   Do not wear jewelry, make-up or nail polish.  Do not wear lotions, powders, or perfumes. You may wear deodorant.  Do not shave 48 hours prior to surgery.   Do not bring valuables to the hospital.  [facility] is not responsible                   for any belongings or valuables.  Contacts, dentures or bridgework may not be worn into surgery.  Leave suitcase in the car. After surgery it may be brought to your room.  For patients admitted to the hospital, checkout time is 11:00 AM the day of  discharge.   Patients discharged the day of surgery will not be allowed to drive  home.    Special Instructions: Shower using CHG 2 nights before surgery and the night before surgery.  If you shower the day of surgery use CHG.  Use special wash - you have one bottle of CHG for all showers.  You should use approximately 1/3 of the bottle for each shower.   Please read over the following fact sheets that you were given: Pain Booklet, Coughing and Deep Breathing, MRSA Information and Surgical Site Infection Prevention

## 2012-07-05 ENCOUNTER — Telehealth: Payer: Self-pay | Admitting: Oncology

## 2012-07-05 MED ORDER — CEFAZOLIN SODIUM-DEXTROSE 2-3 GM-% IV SOLR
2.0000 g | INTRAVENOUS | Status: AC
  Administered 2012-07-06: 2 g via INTRAVENOUS
  Filled 2012-07-05: qty 50

## 2012-07-05 NOTE — Progress Notes (Signed)
Anesthesia chart: Patient is a 52 year old female scheduled for Port-A-Cath placement for treatment for right breast cancer. Surgery is scheduled on 07/06/2012 by Dr. Donell Beers. She may eventually need mastectomy +/- reconstruction.  Other history includes nonsmoker, anemia, hypertension, tubal ligation, HSV infection.  She had evidence of mild pulmonary HTN on echo from 06/2012.  BMI 20.  She denied having a PCP. GYN is Dr. Reynaldo Minium.  Meds: ferrous sulfate, HCTZ, Valtrex.  EKG on 07/04/12 showed NSR, T wave abnormality, consider anterior ischemia versus persistent juvenile pattern.  Currently there are no comparison EKGs in Muse or Epic--she does not report a PCP.  No CV symptoms were documented at her PAT visit.  Echo on 07/03/12 showed: - Left ventricle: The cavity size was normal. Systolic function was normal. The estimated ejection fraction was in the range of 55% to 60%. Left ventricular diastolic function parameters were normal. - Mitral valve: Mild regurgitation. - Atrial septum: No defect or patent foramen ovale was identified. - Tricuspid valve: Mild regurgitation. - Pulmonary arteries: PA peak pressure: 42mm Hg (S). Impressions: The right ventricular systolic pressure was increased consistent with mild pulmonary hypertension.  She had a negative chest x-ray on 07/04/2012.  Labs from 06/28/2012 noted.  She had a negative serum pregnancy test on 07/04/12.  I reviewed EKG and history with anesthesiologist Dr. Krista Blue who agrees with clinical correlation on the day of surgery.  If she remains asymptomatic from a CV standpoint then would plan to proceed.  Velna Ochs [facility]/Anesthesiology Phone [phone] 07/05/2012 12:02 PM

## 2012-07-05 NOTE — Telephone Encounter (Signed)
, °

## 2012-07-06 ENCOUNTER — Encounter (HOSPITAL_COMMUNITY)
Admission: RE | Disposition: A | Discharge status: Home / Self Care | Payer: Self-pay | Source: Ambulatory Visit | Attending: General Surgery

## 2012-07-06 ENCOUNTER — Ambulatory Visit (HOSPITAL_COMMUNITY): Payer: Managed Care, Other (non HMO) | Admitting: Anesthesiology

## 2012-07-06 ENCOUNTER — Ambulatory Visit (HOSPITAL_COMMUNITY): Payer: Managed Care, Other (non HMO)

## 2012-07-06 ENCOUNTER — Encounter (HOSPITAL_COMMUNITY): Payer: Self-pay | Admitting: Vascular Surgery

## 2012-07-06 ENCOUNTER — Other Ambulatory Visit: Payer: Managed Care, Other (non HMO)

## 2012-07-06 ENCOUNTER — Encounter: Payer: Self-pay | Admitting: Genetic Counselor

## 2012-07-06 ENCOUNTER — Ambulatory Visit (HOSPITAL_BASED_OUTPATIENT_CLINIC_OR_DEPARTMENT_OTHER): Payer: Managed Care, Other (non HMO) | Admitting: Genetic Counselor

## 2012-07-06 ENCOUNTER — Encounter (HOSPITAL_COMMUNITY): Payer: Self-pay | Admitting: *Deleted

## 2012-07-06 ENCOUNTER — Ambulatory Visit (HOSPITAL_COMMUNITY)
Admission: RE | Admit: 2012-07-06 | Discharge: 2012-07-06 | Disposition: A | Discharge status: Home / Self Care | Payer: Managed Care, Other (non HMO) | Source: Ambulatory Visit | Attending: General Surgery | Admitting: General Surgery

## 2012-07-06 ENCOUNTER — Other Ambulatory Visit: Payer: Managed Care, Other (non HMO) | Admitting: Lab

## 2012-07-06 DIAGNOSIS — C50919 Malignant neoplasm of unspecified site of unspecified female breast: Secondary | ICD-10-CM

## 2012-07-06 DIAGNOSIS — Z801 Family history of malignant neoplasm of trachea, bronchus and lung: Secondary | ICD-10-CM | POA: Insufficient documentation

## 2012-07-06 DIAGNOSIS — Z8249 Family history of ischemic heart disease and other diseases of the circulatory system: Secondary | ICD-10-CM | POA: Insufficient documentation

## 2012-07-06 DIAGNOSIS — Z800 Family history of malignant neoplasm of digestive organs: Secondary | ICD-10-CM | POA: Insufficient documentation

## 2012-07-06 DIAGNOSIS — C50319 Malignant neoplasm of lower-inner quadrant of unspecified female breast: Secondary | ICD-10-CM | POA: Insufficient documentation

## 2012-07-06 DIAGNOSIS — B009 Herpesviral infection, unspecified: Secondary | ICD-10-CM | POA: Insufficient documentation

## 2012-07-06 DIAGNOSIS — IMO0002 Reserved for concepts with insufficient information to code with codable children: Secondary | ICD-10-CM

## 2012-07-06 DIAGNOSIS — C50311 Malignant neoplasm of lower-inner quadrant of right female breast: Secondary | ICD-10-CM

## 2012-07-06 DIAGNOSIS — Z8041 Family history of malignant neoplasm of ovary: Secondary | ICD-10-CM

## 2012-07-06 DIAGNOSIS — Z8042 Family history of malignant neoplasm of prostate: Secondary | ICD-10-CM

## 2012-07-06 DIAGNOSIS — Z79899 Other long term (current) drug therapy: Secondary | ICD-10-CM | POA: Insufficient documentation

## 2012-07-06 DIAGNOSIS — I10 Essential (primary) hypertension: Secondary | ICD-10-CM | POA: Insufficient documentation

## 2012-07-06 DIAGNOSIS — Z171 Estrogen receptor negative status [ER-]: Secondary | ICD-10-CM | POA: Insufficient documentation

## 2012-07-06 DIAGNOSIS — Z808 Family history of malignant neoplasm of other organs or systems: Secondary | ICD-10-CM

## 2012-07-06 HISTORY — PX: PORTACATH PLACEMENT: SHX2246

## 2012-07-06 SURGERY — INSERTION, TUNNELED CENTRAL VENOUS DEVICE, WITH PORT
Anesthesia: General | Site: Chest | Wound class: Clean

## 2012-07-06 MED ORDER — PHENYLEPHRINE HCL 10 MG/ML IJ SOLN
INTRAMUSCULAR | Status: DC | PRN
  Administered 2012-07-06: 120 ug via INTRAVENOUS
  Administered 2012-07-06 (×3): 80 ug via INTRAVENOUS
  Administered 2012-07-06: 40 ug via INTRAVENOUS

## 2012-07-06 MED ORDER — HEPARIN SOD (PORK) LOCK FLUSH 100 UNIT/ML IV SOLN
INTRAVENOUS | Status: AC
  Filled 2012-07-06: qty 5

## 2012-07-06 MED ORDER — LACTATED RINGERS IV SOLN
INTRAVENOUS | Status: DC | PRN
  Administered 2012-07-06: 13:00:00 via INTRAVENOUS

## 2012-07-06 MED ORDER — LACTATED RINGERS IV SOLN
INTRAVENOUS | Status: DC
  Administered 2012-07-06: 13:00:00 via INTRAVENOUS

## 2012-07-06 MED ORDER — FENTANYL CITRATE 0.05 MG/ML IJ SOLN: 25.0000 ug | INTRAMUSCULAR | Status: DC | PRN

## 2012-07-06 MED ORDER — PROPOFOL 10 MG/ML IV BOLUS
INTRAVENOUS | Status: DC | PRN
  Administered 2012-07-06: 150 mg via INTRAVENOUS

## 2012-07-06 MED ORDER — ONDANSETRON HCL 4 MG/2ML IJ SOLN
INTRAMUSCULAR | Status: DC | PRN
  Administered 2012-07-06: 4 mg via INTRAVENOUS

## 2012-07-06 MED ORDER — LACTATED RINGERS IV SOLN: INTRAVENOUS | Status: DC

## 2012-07-06 MED ORDER — HEPARIN SOD (PORK) LOCK FLUSH 100 UNIT/ML IV SOLN
INTRAVENOUS | Status: DC | PRN
  Administered 2012-07-06: 500 [IU]

## 2012-07-06 MED ORDER — BUPIVACAINE-EPINEPHRINE PF 0.25-1:200000 % IJ SOLN
INTRAMUSCULAR | Status: AC
  Filled 2012-07-06: qty 30

## 2012-07-06 MED ORDER — LIDOCAINE HCL (PF) 1 % IJ SOLN
INTRAMUSCULAR | Status: AC
  Filled 2012-07-06: qty 30

## 2012-07-06 MED ORDER — LIDOCAINE HCL 1 % IJ SOLN
INTRAMUSCULAR | Status: DC | PRN
  Administered 2012-07-06: 14:00:00 via INTRAMUSCULAR

## 2012-07-06 MED ORDER — OXYCODONE-ACETAMINOPHEN 5-325 MG PO TABS: 1.0000 | ORAL_TABLET | ORAL | Status: DC | PRN

## 2012-07-06 MED ORDER — SODIUM CHLORIDE 0.9 % IR SOLN
Status: DC | PRN
  Administered 2012-07-06: 14:00:00

## 2012-07-06 MED ORDER — FENTANYL CITRATE 0.05 MG/ML IJ SOLN
INTRAMUSCULAR | Status: DC | PRN
  Administered 2012-07-06 (×2): 50 ug via INTRAVENOUS

## 2012-07-06 MED ORDER — MIDAZOLAM HCL 5 MG/5ML IJ SOLN
INTRAMUSCULAR | Status: DC | PRN
  Administered 2012-07-06: 1 mg via INTRAVENOUS

## 2012-07-06 SURGICAL SUPPLY — 51 items
BAG DECANTER FOR FLEXI CONT (MISCELLANEOUS) ×2 IMPLANT
BLADE SURG 11 STRL SS (BLADE) ×2 IMPLANT
BLADE SURG 15 STRL LF DISP TIS (BLADE) ×1 IMPLANT
BLADE SURG 15 STRL SS (BLADE) ×1
CANISTER SUCTION 2500CC (MISCELLANEOUS) IMPLANT
CHLORAPREP W/TINT 10.5 ML (MISCELLANEOUS) ×2 IMPLANT
CLOTH BEACON ORANGE TIMEOUT ST (SAFETY) ×2 IMPLANT
COVER SURGICAL LIGHT HANDLE (MISCELLANEOUS) ×2 IMPLANT
COVER TRANSDUCER ULTRASND (DRAPES) IMPLANT
CRADLE DONUT ADULT HEAD (MISCELLANEOUS) ×2 IMPLANT
DECANTER SPIKE VIAL GLASS SM (MISCELLANEOUS) ×2 IMPLANT
DERMABOND ADVANCED (GAUZE/BANDAGES/DRESSINGS) ×1
DERMABOND ADVANCED .7 DNX12 (GAUZE/BANDAGES/DRESSINGS) ×1 IMPLANT
DRAPE C-ARM 42X72 X-RAY (DRAPES) ×2 IMPLANT
DRAPE LAPAROSCOPIC ABDOMINAL (DRAPES) ×2 IMPLANT
DRAPE UTILITY 15X26 W/TAPE STR (DRAPE) ×4 IMPLANT
DRAPE WARM FLUID 44X44 (DRAPE) IMPLANT
ELECT COATED BLADE 2.86 ST (ELECTRODE) ×2 IMPLANT
ELECT REM PT RETURN 9FT ADLT (ELECTROSURGICAL) ×2
ELECTRODE REM PT RTRN 9FT ADLT (ELECTROSURGICAL) ×1 IMPLANT
GAUZE SPONGE 4X4 16PLY XRAY LF (GAUZE/BANDAGES/DRESSINGS) ×2 IMPLANT
GLOVE BIO SURGEON STRL SZ 6 (GLOVE) ×2 IMPLANT
GLOVE BIOGEL PI IND STRL 6.5 (GLOVE) ×1 IMPLANT
GLOVE BIOGEL PI IND STRL 7.0 (GLOVE) ×2 IMPLANT
GLOVE BIOGEL PI INDICATOR 6.5 (GLOVE) ×1
GLOVE BIOGEL PI INDICATOR 7.0 (GLOVE) ×2
GOWN PREVENTION PLUS XXLARGE (GOWN DISPOSABLE) ×2 IMPLANT
GOWN STRL NON-REIN LRG LVL3 (GOWN DISPOSABLE) ×2 IMPLANT
KIT BASIN OR (CUSTOM PROCEDURE TRAY) ×2 IMPLANT
KIT PORT POWER 8FR ISP CVUE (Catheter) ×4 IMPLANT
KIT PORT POWER 9.6FR MRI PREA (Catheter) IMPLANT
KIT PORT POWER ISP 8FR (Catheter) IMPLANT
KIT POWER CATH 8FR (Catheter) IMPLANT
KIT ROOM TURNOVER OR (KITS) ×2 IMPLANT
NEEDLE HYPO 25GX1X1/2 BEV (NEEDLE) ×2 IMPLANT
NS IRRIG 1000ML POUR BTL (IV SOLUTION) ×2 IMPLANT
PACK SURGICAL SETUP 50X90 (CUSTOM PROCEDURE TRAY) ×2 IMPLANT
PAD ARMBOARD 7.5X6 YLW CONV (MISCELLANEOUS) ×2 IMPLANT
PENCIL BUTTON HOLSTER BLD 10FT (ELECTRODE) ×2 IMPLANT
SUT MON AB 4-0 PC3 18 (SUTURE) ×2 IMPLANT
SUT PROLENE 2 0 SH DA (SUTURE) ×4 IMPLANT
SUT VIC AB 3-0 SH 27 (SUTURE) ×1
SUT VIC AB 3-0 SH 27X BRD (SUTURE) ×1 IMPLANT
SYR 20ML ECCENTRIC (SYRINGE) ×2 IMPLANT
SYR 5ML LUER SLIP (SYRINGE) ×2 IMPLANT
SYR CONTROL 10ML LL (SYRINGE) ×2 IMPLANT
TOWEL OR 17X24 6PK STRL BLUE (TOWEL DISPOSABLE) ×2 IMPLANT
TOWEL OR 17X26 10 PK STRL BLUE (TOWEL DISPOSABLE) ×2 IMPLANT
TUBE CONNECTING 12X1/4 (SUCTIONS) IMPLANT
WATER STERILE IRR 1000ML POUR (IV SOLUTION) IMPLANT
YANKAUER SUCT BULB TIP NO VENT (SUCTIONS) IMPLANT

## 2012-07-06 NOTE — Progress Notes (Signed)
Dr. Drue Second requested a consultation for genetic counseling and risk assessment for Jaime Murillo, a 52 y.o. female, for discussion of her personal history of breast cancer and family history of prostate, ovarian and stomach cacner.  She presents to clinic today to discuss the possibility of a genetic predisposition to cancer, and to further clarify her risks, as well as her family members' risks for cancer.   HISTORY OF PRESENT ILLNESS: In 2014, at the age of 53, Jaime Murillo was diagnosed with invasive ductal carcinoma of the right breast. This will be treated with chemotherapy and surgery.  Depending on the surgery she will undergo radiation.  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    Past Medical History  Diagnosis Date  . HSV infection   . NSVD (normal spontaneous vaginal delivery)   . Hypertension     takes HCTZ  . Urinary frequency   . Anemia     takes Ferrous Sulfate occasionally  . Breast cancer 2014    triple negative    Past Surgical History  Procedure Laterality Date  . Tubal ligation  2003    LTL    History   Social History  . Marital Status: Single    Spouse Name: N/A    Number of Children: 2  . Years of Education: N/A   Social History Main Topics  . Smoking status: Never Smoker   . Smokeless tobacco: Never Used  . Alcohol Use: No  . Drug Use: No  . Sexually Active: Yes     Comment: BTL   Other Topics Concern  . None   Social History Narrative  . None    REPRODUCTIVE HISTORY AND PERSONAL RISK ASSESSMENT FACTORS: Menarche was at age 89.   perimenopausal Uterus Intact: yes Ovaries Intact: yes G2P2A0, first live birth at age [age]  She has not previously undergone treatment for infertility.   Oral Contraceptive use: 13 years   She has not used HRT in the past.    FAMILY HISTORY:  We obtained a detailed, 4-generation family history.  Significant diagnoses are listed below: Family History  Problem Relation Age of Onset  . Heart disease Mother    . Hypertension Father   . Prostate cancer Father 65  . Hypertension Sister   . Ovarian cancer Paternal Grandmother     died in her 75s  . Stomach cancer Maternal Grandmother 41  . Cancer Cousin     paternal cousin with an unknown form of cancer  The patient was diagnosed with triple negative breast cancer.  Her maternal half sister is being followed for a lump in her breast and her full sister has had a benign breast biopsy in the past.  The patient's mother died of a brain aneurysm at 15.  She had a full brother and maternal half sister.  The sister is alive but nothing is known about her or her children.  The brother is deceased of old age.  The maternal grandmother died at 41 from stomach cancer.  The patient's father was diagnosed with prostate cancer at 76.  He had a full sister and brother, and a maternal half brother.  The half brother is deceased of unknown causes, and the sister has a daughter with an unknown type of cancer.  The paternal grandmother died in her 30s of ovarian cancer.  Patient's maternal ancestors are of African American descent, and paternal ancestors are of African American descent. There is no reported Ashkenazi Jewish ancestry. There is  no  known consanguinity.  GENETIC COUNSELING ASSESSMENT: Jaime Murillo is a 52 y.o. female with a personal history of triple negative breast cancer and family history of prostate, ovarian and stomach which somewhat suggestive of a hereditary breast and ovarian cancer syndrome and predisposition to cancer. We, therefore, discussed and recommended the following at today's visit.   DISCUSSION: We reviewed the characteristics, features and inheritance patterns of hereditary cancer syndromes. We also discussed genetic testing, including the appropriate family members to test, the process of testing, insurance coverage and turn-around-time for results. We recommended [employer] [facility] pursue genetic testing for BRCA1/2 and del/dup reflexing to  breast and ovarian cancer panel at [facility].   PLAN: After considering the risks, benefits, and limitations, Jaime Murillo provided informed consent to pursue genetic testing and the blood sample will be sent to [facility] for analysis of the BRCA and del/dup and reflexing to the Breast/Ovarian Cancer Panel. We discussed the implications of a positive, negative and/ or variant of uncertain significance genetic test result. Results should be available within approximately 7-10 days for BRCA and an additional 3-4 weeks' time, at which point they will be disclosed by telephone to [facility], as will any additional recommendations warranted by these results. Jaime Murillo will receive a summary of her genetic counseling visit and a copy of her results once available. This information will also be available in Epic. We encouraged Jaime Murillo to remain in contact with cancer genetics annually so that we can continuously update the family history and inform her of any changes in cancer genetics and testing that may be of benefit for her family. Jaime Murillo's questions were answered to her satisfaction today. Our contact information was provided should additional questions or concerns arise.  Per the patient's request, we will contact her by telephone to discuss these results. A follow up genetic counseling visit will be scheduled if indicated.  The patient was seen for a total of 60 minutes, greater than 50% of which was spent face-to-face counseling.  This plan is being carried out per Dr. Feliz Beam recommendations.  This note will also be sent to the referring provider via the electronic medical record. The patient will be supplied with a summary of this genetic counseling discussion as well as educational information on the discussed hereditary cancer syndromes following the conclusion of their visit.   Patient was discussed with Dr. Drue Second.   _______________________________________________________________________ For Office Staff:  Number of people involved in session: 2 Was an Intern/ student involved with case: no

## 2012-07-06 NOTE — H&P (View-Only) (Signed)
Chief complaint:  Right breast cancer  HISTORY: Patient is a 52 year old female who presents with a new diagnosis of right breast cancer. She is seen at the request of Dr. Reynaldo Minium.  She presented with a palpable mass. She was seen on mammogram and ultrasound have a 1.7 cm mass at 3:00 of the right breast. She subsequently underwent MRI and was seen to have 3 areas of concern that were all close, but separate. There was a 6 mm area posterior to the original area of biopsy as well as a 1.6 cm area that is slightly superior and anterior.  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  She has not ever had a history of breast problems in the past. She has not gotten the past few mammograms. She does have a family history of cancer with a grandmother on her father's side having ovarian cancer in a grandmother mother side having stomach cancer. Her brother has lung cancer. She does have 2 children age 98 and 56. She does not smoke or drink alcohol. She worked for [employer] until recently.  Menarche was age 15. She is still having periods. These are regular. She had her first child at age 31 and has used hormone treatment for contraception. She has not had a colonoscopy or bone density study. She is up-to-date with her Pap smears.  Past Medical History  Diagnosis Date  . HSV infection   . NSVD (normal spontaneous vaginal delivery)   . Hypertension   . Breast cancer     Past Surgical History  Procedure Laterality Date  . Tubal ligation  2003    LTL    Current Outpatient Prescriptions  Medication Sig Dispense Refill  . ferrous sulfate 325 (65 FE) MG tablet Take 325 mg by mouth daily with breakfast.      . hydrochlorothiazide (HYDRODIURIL) 25 MG tablet Take 1 tablet (25 mg total) by mouth daily.  30 tablet  11  . Multiple Vitamin (MULTIVITAMIN) tablet Take 1 tablet by mouth daily.      . valACYclovir (VALTREX) 500 MG tablet Take 1 tablet (500 mg  total) by mouth daily.  30 tablet  11   No current facility-administered medications for this visit.     No Known Allergies   Family History  Problem Relation Age of Onset  . Heart disease Mother   . Hypertension Father   . Hypertension Sister   . Ovarian cancer Paternal Grandmother   Stomach cancer in maternal grandmother    History   Social History  . Marital Status: Single    Spouse Name: N/A    Number of Children: N/A  . Years of Education: N/A   Social History Main Topics  . Smoking status: Never Smoker   . Smokeless tobacco: Never Used  . Alcohol Use: No  . Drug Use: No  . Sexually Active: Yes     Comment: BTL    REVIEW OF SYSTEMS - PERTINENT POSITIVES ONLY: 12 point review of systems negative other than HPI and PMH except for contacts, skin rash  EXAM: Wt Readings from Last 3 Encounters:  06/28/12 116 lb 8 oz (52.844 kg)  06/06/12 118 lb (53.524 kg)  05/05/11 115 lb (52.164 kg)   Temp Readings from Last 3 Encounters:  06/28/12 98.2 F (36.8 C) Oral  11/28/11 98 F (36.7 C) Oral   BP Readings from Last 3 Encounters:  06/28/12 152/95  06/06/12 138/86  11/28/11 171/96  Pulse Readings from Last 3 Encounters:  06/28/12 77  11/28/11 60    Gen:  No acute distress.  Well nourished and well groomed.   Neurological: Alert and oriented to person, place, and time. Coordination normal.  Head: Normocephalic and atraumatic.  Eyes: Conjunctivae are normal. Pupils are equal, round, and reactive to light. No scleral icterus.  Neck: Normal range of motion. Neck supple. No tracheal deviation or thyromegaly present.  Cardiovascular: Normal rate, regular rhythm, normal heart sounds and intact distal pulses.  Exam reveals no gallop and no friction rub.  No murmur heard. Breast:  Palpable mass on right at 3 oclock.  1.5 cm.  Small palpable lymph node on right.  Left breast without masses or LAD>  No nipple discharge or skin dimpling.   Respiratory: Effort normal.   No respiratory distress. No chest wall tenderness. Breath sounds normal.  No wheezes, rales or rhonchi.  GI: Soft. Bowel sounds are normal. The abdomen is soft and nontender.  There is no rebound and no guarding.  Musculoskeletal: Normal range of motion. Extremities are nontender.  Lymphadenopathy: No cervical, preauricular, postauricular or axillary adenopathy is present Skin: Skin is warm and dry. No rash noted. No diaphoresis. No erythema. No pallor. No clubbing, cyanosis, or edema.   Psychiatric: Normal mood and affect. Behavior is normal. Judgment and thought content normal.    LABORATORY RESULTS: Available labs are reviewed  CBC, CMET normal  RADIOLOGY RESULTS: See E-Chart or I-Site for most recent results.  Images and reports are reviewed. MRI IMPRESSION:  Known biopsy-proven malignancy over the deep third of the inner mid  right breast. 6 mm ill-defined enhancing ovoid mass 6 mm posterior  lateral to the biopsy-proven malignancy and lying just superficial  to the pectoralis muscle. Clumped non mass enhancement anterior  and superior to patient's known malignancy at approximately the 2  o'clock position measuring 0.8 x 1.6 x 2.2 cm. Findings likely  represent multi focal disease.  Mammo/ultrasound IMPRESSION:  Indeterminate 7 x 6 x 17 mm hypoechoic mass at the 3 o'clock  position of the right breast. Tissue sampling is recommended.  No mammographic evidence of breast malignancy bilaterally.  No mammographic, palpable or sonographic abnormality at the 12  o'clock position of the right breast, in the area of palpable  concern.  BI-RADS CATEGORY 4: Suspicious abnormality - biopsy should be  considered.     ASSESSMENT AND PLAN: Cancer of lower-inner quadrant of female breast Because of the multiple areas seen on MRI, we will get second ultrasound with biopsy versus MRI guided biopsy of the additional area. If the entire area of MRI abnormality is malignant, it would be  difficult to do breast conservation therapy given her size.  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 During that time, she can see genetics and plastic surgery.  We will ascertain whether she will undergo mastectomy, mastectomy with reconstruction, or bilateral mastectomies with reconstruction.  We will place port a cath next week.  The surgery was reviewed, and the patient was shown a picture of the port a cath.     I reviewed risks of port a cath including bleeding, infection, damage to adjacent structures (pneumothorax), malfunction, malposition, and blood clot. The patient understands and wishes to proceed.   I will see her back mid treatment. We will refer her also for plastic surgery consult.    45 min spent with examination, evaluation, coordination of care, and counseling.  >50%  spent in counseling.       Maudry Diego MD Surgical Oncology, General and Endocrine Surgery [facility]      Visit Diagnoses: 1. Cancer of lower-inner quadrant of female breast, right     Primary Care Physician: Melford Aase, MD Radiation :  Dr. Dayton Scrape Oncology:  Dr. Welton Flakes OB:  Dr. Reynaldo Minium

## 2012-07-06 NOTE — Op Note (Signed)
PREOPERATIVE DIAGNOSIS:  Right breast cancer     POSTOPERATIVE DIAGNOSIS:  Same     PROCEDURE: Left subclavian ultrasound-guided port placement, Bard   Power Port, MRI safe, 8-French.      SURGEON:  Almond Lint, MD      ANESTHESIA:  General   FINDINGS:  Good venous return, easy flush, and tip of the catheter and   SVC 24 cm.      SPECIMEN:  None.      ESTIMATED BLOOD LOSS:  Minimal.      COMPLICATIONS:  None known.      PROCEDURE:  Pt was identified in the holding area and taken to   the operating room, where patient was placed supine on the operating room   table.  General anesthesia was induced.  Patient's arms were tucked and the upper   chest and neck were prepped and draped in sterile fashion.  Time-out was   performed according to the surgical safety check list.  When all was   correct, we continued.   Local anesthetic was administered over this   area at the angle of the clavicle.  The vein was accessed with 1 pass of the needle. There was good venous return and the wire passed easily with no ectopy.   Fluoroscopy was used to confirm that the wire was in the vena cava.      The patient was placed back level and the area for the pocket was anethetized   with local anesthetic.  A 3-cm transverse incision was made with a #15   blade.  Cautery was used to divide the subcutaneous tissues down to the   pectoralis muscle.  An [employer]-Nav[employer] retractor was used to elevate the skin   while a pocket was created on top of the pectoralis fascia.  The port   was placed into the pocket to confirm that it was of adequate size.  The   catheter was preattached to the port.  The port was then secured to the   pectoralis fascia with four 2-0 Prolene sutures.  These were clamped and   not tied down yet.    The catheter was tunneled through to the wire exit   site.  The catheter was placed along the wire to determine what length it should be to be in the SVC.  The catheter was cut at 24 cm.  The  tunneler sheath and dilator were passed over the wire and the dilator and wire were removed.  The catheter was advanced through the tunneler sheath and the tunneler sheath was pulled away.  Care was taken to keep the catheter in the tunneler sheath as this occurred. This was advanced and the tunneler sheath was removed.  There was good venous   return and easy flush of the catheter.  The Prolene sutures were tied   down to the pectoral fascia.  The skin was reapproximated using 3-0   Vicryl interrupted deep dermal sutures.    Fluoroscopy was used to re-confirm good position of the catheter.  The skin   was then closed using 4-0 Monocryl in a subcuticular fashion.  The port was flushed with concentrated heparin flush as well.  The wounds were then cleaned, dried, and dressed with Dermabond.  The patient was awakened from anesthesia and taken to the PACU in stable condition.  Needle, sponge, and instrument counts were correct.               Almond Lint, MD

## 2012-07-06 NOTE — Transfer of Care (Signed)
Immediate Anesthesia Transfer of Care Note  Patient: Jaime Murillo  Procedure(s) Performed: Procedure(s): INSERTION PORT-A-CATH (N/A)  Patient Location: PACU  Anesthesia Type:General  Level of Consciousness: awake, alert , oriented and sedated  Airway & Oxygen Therapy: Patient Spontanous Breathing and Patient connected to nasal cannula oxygen  Post-op Assessment: Report given to PACU RN, Post -op Vital signs reviewed and stable and Patient moving all extremities  Post vital signs: Reviewed and stable  Complications: No apparent anesthesia complications

## 2012-07-06 NOTE — Interval H&P Note (Signed)
History and Physical Interval Note:  07/06/2012 1:29 PM  Jaime Murillo  has presented today for surgery, with the diagnosis of right breast cancer  The various methods of treatment have been discussed with the patient and family. After consideration of risks, benefits and other options for treatment, the patient has consented to  Procedure(s): INSERTION PORT-A-CATH (N/A) as a surgical intervention .  The patient's history has been reviewed, patient examined, no change in status, stable for surgery.  I have reviewed the patient's chart and labs.  Questions were answered to the patient's satisfaction.     Navjot Pilgrim

## 2012-07-06 NOTE — Anesthesia Postprocedure Evaluation (Signed)
  Anesthesia Post-op Note  Patient: Jaime Murillo  Procedure(s) Performed: Procedure(s) (LRB): INSERTION PORT-A-CATH (N/A)  Patient Location: PACU  Anesthesia Type: General  Level of Consciousness: awake and alert   Airway and Oxygen Therapy: Patient Spontanous Breathing  Post-op Pain: mild  Post-op Assessment: Post-op Vital signs reviewed, Patient's Cardiovascular Status Stable, Respiratory Function Stable, Patent Airway and No signs of Nausea or vomiting  Last Vitals:  Filed Vitals:   07/06/12 1445  BP: 146/90  Pulse: 81  Temp: 36.4 C  Resp: 18    Post-op Vital Signs: stable   Complications: No apparent anesthesia complications

## 2012-07-06 NOTE — Preoperative (Signed)
Beta Blockers   Reason not to administer Beta Blockers:Not Applicable 

## 2012-07-06 NOTE — Anesthesia Preprocedure Evaluation (Addendum)
Anesthesia Evaluation  Patient identified by MRN, date of birth, ID band Patient awake    Reviewed: Allergy & Precautions, H&P , NPO status , Patient's Chart, lab work & pertinent test results  Airway Mallampati: II TM Distance: >3 FB Neck ROM: full    Dental  (+) Dental Advisory Given and Missing Missing right upper front tooth:   Pulmonary neg pulmonary ROS,  breath sounds clear to auscultation  Pulmonary exam normal       Cardiovascular Exercise Tolerance: Good hypertension, Pt. on medications Rhythm:regular Rate:Normal     Neuro/Psych negative neurological ROS  negative psych ROS   GI/Hepatic negative GI ROS, Neg liver ROS,   Endo/Other  negative endocrine ROS  Renal/GU negative Renal ROS  negative genitourinary   Musculoskeletal   Abdominal   Peds  Hematology negative hematology ROS (+)   Anesthesia Other Findings   Reproductive/Obstetrics negative OB ROS Breast Cancer                          Anesthesia Physical Anesthesia Plan  ASA: II  Anesthesia Plan: General   Post-op Pain Management:    Induction: Intravenous  Airway Management Planned: LMA  Additional Equipment:   Intra-op Plan:   Post-operative Plan:   Informed Consent: I have reviewed the patients History and Physical, chart, labs and discussed the procedure including the risks, benefits and alternatives for the proposed anesthesia with the patient or authorized representative who has indicated his/her understanding and acceptance.   Dental Advisory Given  Plan Discussed with: CRNA and Surgeon  Anesthesia Plan Comments:        Anesthesia Quick Evaluation

## 2012-07-10 ENCOUNTER — Other Ambulatory Visit: Payer: Managed Care, Other (non HMO) | Admitting: Lab

## 2012-07-10 ENCOUNTER — Ambulatory Visit: Payer: Managed Care, Other (non HMO) | Admitting: Oncology

## 2012-07-11 ENCOUNTER — Ambulatory Visit: Payer: Managed Care, Other (non HMO)

## 2012-07-11 ENCOUNTER — Encounter: Payer: Self-pay | Admitting: *Deleted

## 2012-07-11 ENCOUNTER — Ambulatory Visit (HOSPITAL_BASED_OUTPATIENT_CLINIC_OR_DEPARTMENT_OTHER): Payer: Managed Care, Other (non HMO) | Admitting: Oncology

## 2012-07-11 ENCOUNTER — Encounter (HOSPITAL_COMMUNITY): Payer: Self-pay | Admitting: General Surgery

## 2012-07-11 ENCOUNTER — Telehealth: Payer: Self-pay | Admitting: *Deleted

## 2012-07-11 ENCOUNTER — Encounter: Payer: Self-pay | Admitting: Oncology

## 2012-07-11 ENCOUNTER — Other Ambulatory Visit: Payer: Managed Care, Other (non HMO)

## 2012-07-11 ENCOUNTER — Other Ambulatory Visit (HOSPITAL_BASED_OUTPATIENT_CLINIC_OR_DEPARTMENT_OTHER): Payer: Managed Care, Other (non HMO) | Admitting: Lab

## 2012-07-11 VITALS — BP 117/75 | HR 73 | Temp 98.3°F | Resp 20 | Ht 63.0 in | Wt 113.0 lb

## 2012-07-11 DIAGNOSIS — C50319 Malignant neoplasm of lower-inner quadrant of unspecified female breast: Secondary | ICD-10-CM

## 2012-07-11 DIAGNOSIS — C50311 Malignant neoplasm of lower-inner quadrant of right female breast: Secondary | ICD-10-CM

## 2012-07-11 LAB — CBC WITH DIFFERENTIAL/PLATELET
BASO%: 1.2 % (ref 0.0–2.0)
EOS%: 0.8 % (ref 0.0–7.0)
MCH: 33 pg (ref 25.1–34.0)
MCHC: 35.4 g/dL (ref 31.5–36.0)
MONO#: 0.5 10*3/uL (ref 0.1–0.9)
NEUT%: 47.5 % (ref 38.4–76.8)
RBC: 3.68 10*6/uL — ABNORMAL LOW (ref 3.70–5.45)
RDW: 12.3 % (ref 11.2–14.5)
WBC: 4.5 10*3/uL (ref 3.9–10.3)
lymph#: 1.8 10*3/uL (ref 0.9–3.3)

## 2012-07-11 LAB — COMPREHENSIVE METABOLIC PANEL (CC13)
ALT: 9 U/L (ref 0–55)
AST: 19 U/L (ref 5–34)
CO2: 27 mEq/L (ref 22–29)
Calcium: 9.5 mg/dL (ref 8.4–10.4)
Chloride: 102 mEq/L (ref 98–107)
Creatinine: 0.8 mg/dL (ref 0.6–1.1)
Sodium: 137 mEq/L (ref 136–145)
Total Protein: 7.6 g/dL (ref 6.4–8.3)

## 2012-07-11 NOTE — Telephone Encounter (Signed)
appts made and printed...td 

## 2012-07-11 NOTE — Progress Notes (Signed)
See previous notes--- she is getting Neulasta, Emend and Aloxi.

## 2012-07-11 NOTE — Progress Notes (Signed)
Called and spoke with Jaime Murillo at [place] and this patient is active until 07/24/12- ref# [id number]. I emailed Ebony to advise her that as of 07/25/12 the patient will be uninsured and she is getting Aloxi,

## 2012-07-11 NOTE — Progress Notes (Signed)
Enrolled pt in the Neulasta First Step program.  I faxed signed form and activated card today.  °

## 2012-07-12 ENCOUNTER — Ambulatory Visit
Admission: RE | Admit: 2012-07-12 | Discharge: 2012-07-12 | Disposition: A | Discharge status: Home / Self Care | Payer: Managed Care, Other (non HMO) | Source: Ambulatory Visit | Attending: Gynecology | Admitting: Gynecology

## 2012-07-12 ENCOUNTER — Other Ambulatory Visit: Payer: Self-pay | Admitting: Gynecology

## 2012-07-12 DIAGNOSIS — R928 Other abnormal and inconclusive findings on diagnostic imaging of breast: Secondary | ICD-10-CM

## 2012-07-12 MED ORDER — GADOBENATE DIMEGLUMINE 529 MG/ML IV SOLN
9.0000 mL | Freq: Once | INTRAVENOUS | Status: AC | PRN
  Administered 2012-07-12: 9 mL via INTRAVENOUS

## 2012-07-17 ENCOUNTER — Telehealth: Payer: Self-pay | Admitting: Oncology

## 2012-07-17 ENCOUNTER — Ambulatory Visit (HOSPITAL_BASED_OUTPATIENT_CLINIC_OR_DEPARTMENT_OTHER): Payer: Managed Care, Other (non HMO) | Admitting: Oncology

## 2012-07-17 ENCOUNTER — Ambulatory Visit (HOSPITAL_BASED_OUTPATIENT_CLINIC_OR_DEPARTMENT_OTHER): Payer: Managed Care, Other (non HMO)

## 2012-07-17 ENCOUNTER — Other Ambulatory Visit (HOSPITAL_BASED_OUTPATIENT_CLINIC_OR_DEPARTMENT_OTHER): Payer: Managed Care, Other (non HMO) | Admitting: Lab

## 2012-07-17 ENCOUNTER — Encounter: Payer: Self-pay | Admitting: Oncology

## 2012-07-17 VITALS — BP 124/81 | HR 80 | Temp 98.3°F | Resp 20 | Ht 63.0 in | Wt 119.3 lb

## 2012-07-17 DIAGNOSIS — C50919 Malignant neoplasm of unspecified site of unspecified female breast: Secondary | ICD-10-CM

## 2012-07-17 DIAGNOSIS — Z5111 Encounter for antineoplastic chemotherapy: Secondary | ICD-10-CM

## 2012-07-17 DIAGNOSIS — C50311 Malignant neoplasm of lower-inner quadrant of right female breast: Secondary | ICD-10-CM

## 2012-07-17 DIAGNOSIS — Z171 Estrogen receptor negative status [ER-]: Secondary | ICD-10-CM

## 2012-07-17 LAB — CBC WITH DIFFERENTIAL/PLATELET
BASO%: 0.6 % (ref 0.0–2.0)
EOS%: 1.2 % (ref 0.0–7.0)
HCT: 31.9 % — ABNORMAL LOW (ref 34.8–46.6)
LYMPH%: 45.3 % (ref 14.0–49.7)
MCH: 31.8 pg (ref 25.1–34.0)
MCHC: 34.8 g/dL (ref 31.5–36.0)
NEUT%: 44 % (ref 38.4–76.8)
Platelets: 249 10*3/uL (ref 145–400)
RBC: 3.49 10*6/uL — ABNORMAL LOW (ref 3.70–5.45)
WBC: 4.9 10*3/uL (ref 3.9–10.3)

## 2012-07-17 LAB — COMPREHENSIVE METABOLIC PANEL (CC13)
ALT: 9 U/L (ref 0–55)
AST: 19 U/L (ref 5–34)
Alkaline Phosphatase: 49 U/L (ref 40–150)
Creatinine: 0.8 mg/dL (ref 0.6–1.1)
Sodium: 137 mEq/L (ref 136–145)
Total Bilirubin: 0.36 mg/dL (ref 0.20–1.20)
Total Protein: 6.8 g/dL (ref 6.4–8.3)

## 2012-07-17 MED ORDER — SODIUM CHLORIDE 0.9 % IV SOLN
Freq: Once | INTRAVENOUS | Status: AC
  Administered 2012-07-17: 16:00:00 via INTRAVENOUS

## 2012-07-17 MED ORDER — DOXORUBICIN HCL CHEMO IV INJECTION 2 MG/ML
60.0000 mg/m2 | Freq: Once | INTRAVENOUS | Status: AC
  Administered 2012-07-17: 92 mg via INTRAVENOUS
  Filled 2012-07-17: qty 46

## 2012-07-17 MED ORDER — SODIUM CHLORIDE 0.9 % IJ SOLN
10.0000 mL | INTRAMUSCULAR | Status: DC | PRN
  Administered 2012-07-17: 10 mL
  Filled 2012-07-17: qty 10

## 2012-07-17 MED ORDER — HEPARIN SOD (PORK) LOCK FLUSH 100 UNIT/ML IV SOLN
500.0000 [IU] | Freq: Once | INTRAVENOUS | Status: AC | PRN
  Administered 2012-07-17: 500 [IU]
  Filled 2012-07-17: qty 5

## 2012-07-17 MED ORDER — LORAZEPAM 2 MG/ML IJ SOLN
0.5000 mg | Freq: Once | INTRAMUSCULAR | Status: AC
  Administered 2012-07-17: 0.5 mg via INTRAVENOUS

## 2012-07-17 MED ORDER — PALONOSETRON HCL INJECTION 0.25 MG/5ML
0.2500 mg | Freq: Once | INTRAVENOUS | Status: AC
Start: 2012-07-17 — End: 2012-07-17
  Administered 2012-07-17: 0.25 mg via INTRAVENOUS

## 2012-07-17 MED ORDER — SODIUM CHLORIDE 0.9 % IV SOLN
150.0000 mg | Freq: Once | INTRAVENOUS | Status: AC
  Administered 2012-07-17: 150 mg via INTRAVENOUS
  Filled 2012-07-17: qty 5

## 2012-07-17 MED ORDER — DEXAMETHASONE SODIUM PHOSPHATE 20 MG/5ML IJ SOLN
12.0000 mg | Freq: Once | INTRAMUSCULAR | Status: AC
  Administered 2012-07-17: 12 mg via INTRAVENOUS

## 2012-07-17 MED ORDER — SODIUM CHLORIDE 0.9 % IV SOLN
600.0000 mg/m2 | Freq: Once | INTRAVENOUS | Status: AC
  Administered 2012-07-17: 920 mg via INTRAVENOUS
  Filled 2012-07-17: qty 46

## 2012-07-17 NOTE — Patient Instructions (Addendum)
Proceed with cycle 1 of chemotherapy (adriamycin/cytoxan)  You will come back on 6/24 for neulasta  I will see you back in 1 week for follow up and labs

## 2012-07-17 NOTE — Progress Notes (Signed)
OFFICE PROGRESS NOTE  CC  [name], MD [address] [place] [address] Dr. Everardo Beals Dr. 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 Patient had a palpable mass at the 3:00 position. She was seen at the breast Center on 06/20/2012 she had a mammogram and ultrasound showing a 1.7 cm mass at the 3:00 position. 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 Tumor was grade 3.  #2 MRIs performed on 06/26/2012 showed no biopsy mass at 3:00 but also 2 other adjacent masses at 1 at 2:00 measuring 0.8 x 1.6 x 2.2 cm andalso 6 mm ill-defined mass posterior to the biopsy-proven malignancy.  #3 patient has gone on to have a Port-A-Cath placed to begin neoadjuvant chemotherapy initially consisting of Adriamycin Cytoxan to be given dose dense for 4 cycles followed by weekly Taxol and carboplatinum for a total of 12 weeks.carefully agent made to have an excellent response to neoadjuvant chemotherapy and could possibly have breast preservation of course she may even need chest mastectomy with sentinel lymph node biopsies. Patient and I had met at the [facility] clinic we discussed the rationale for upfront chemotherapy she is now here to receive that  CURRENT THERAPY:cycle 1 day 1 of Adriamycin and Cytoxan  INTERVAL HISTORY: Jaime Murillo 52 y.o. female returns for followup visit today. She is now 34. Overall she is feeling well. She had a wonderful birthday. Today she denies any fevers chills night sweats headaches no shortness of breath chest pains palpitations. She has no myalgias and arthralgias. Remainder of the 10 point review of systems is negative.  MEDICAL  HISTORY: Past Medical History  Diagnosis Date  . HSV infection   . NSVD (normal spontaneous vaginal delivery)   . Hypertension     takes HCTZ  . Urinary frequency   . Anemia     takes Ferrous Sulfate occasionally  . Breast cancer 2014    triple negative    ALLERGIES:  has No Known Allergies.  MEDICATIONS:  Current Outpatient Prescriptions  Medication Sig Dispense Refill  . dexamethasone (DECADRON) 4 MG tablet Take 4 mg by mouth 2 (two) times daily with a meal. Take 2 tabs every day on the day after chemo an then take 2 tabs twice a day with food for 2 days.      . ferrous sulfate 325 (65 FE) MG tablet Take 325 mg by mouth daily with breakfast.      . hydrochlorothiazide (HYDRODIURIL) 25 MG tablet Take 1 tablet (25 mg total) by mouth daily.  30 tablet  11  . lidocaine-prilocaine (EMLA) cream Apply 1 application topically as needed (apply to PAC site 1-2 hours prior to treatment.).      Marland Kitchen LORazepam (ATIVAN) 0.5 MG tablet Take 0.5 mg by mouth every 6 (six) hours as needed for anxiety (as needed for nausea and vomitting).      . ondansetron (ZOFRAN) 8 MG tablet Take 8 mg by mouth every 8 (eight) hours as needed for nausea (Take 1 pill by mouth twice a day as needed  for nausea and vomitting starting 3rd day after chemo).      . prochlorperazine (COMPAZINE) 10 MG tablet Take 10 mg by mouth every 6 (six) hours  as needed (for nausea and vomitting).      . prochlorperazine (COMPAZINE) 25 MG suppository Place 25 mg rectally every 12 (twelve) hours as needed for nausea (as needed for nausea and vomitting).      . valACYclovir (VALTREX) 500 MG tablet Take 500 mg by mouth daily as needed. For herpes      . oxyCODONE-acetaminophen (ROXICET) 5-325 MG per tablet Take 1-2 tablets by mouth every 4 (four) hours as needed for pain.  30 tablet  0   No current facility-administered medications for this visit.   Facility-Administered Medications Ordered in Other Visits  Medication Dose Route Frequency  Provider Last Rate Last Dose  . sodium chloride 0.9 % injection 10 mL  10 mL Intracatheter PRN Victorino December, MD   10 mL at 07/17/12 1808    SURGICAL HISTORY:  Past Surgical History  Procedure Laterality Date  . Tubal ligation  2003    LTL  . Portacath placement N/A 07/06/2012    Procedure: INSERTION PORT-A-CATH;  Surgeon: Almond Lint, MD;  Location: [facility] OR;  Service: General;  Laterality: N/A;    REVIEW OF SYSTEMS:  Pertinent items are noted in HPI.   HEALTH MAINTENANCE:   PHYSICAL EXAMINATION: Blood pressure 124/81, pulse 80, temperature 98.3 F (36.8 C), temperature source Oral, resp. rate 20, height 5\' 3"  (1.6 m), weight 119 lb 4.8 oz (54.114 kg), last menstrual period 06/19/2012. Body mass index is 21.14 kg/(m^2). ECOG PERFORMANCE STATUS: 0 - Asymptomatic   General appearance: alert, cooperative and appears stated age Resp: clear to auscultation bilaterally Cardio: regular rate and rhythm GI: soft, non-tender; bowel sounds normal; no masses,  no organomegaly Extremities: extremities normal, atraumatic, no cyanosis or edema Neurologic: Grossly normal Breast examination: Left breast no masses or nipple discharge Right breast reveals 3 discrete masses at the 3:00 position 2:00 position.  It is nontender there is no nipple discharge. Conglomeration of these masses to my exam measure about 3-4 cm.  LABORATORY DATA: Lab Results  Component Value Date   WBC 4.9 07/17/2012   HGB 11.1* 07/17/2012   HCT 31.9* 07/17/2012   MCV 91.4 07/17/2012   PLT 249 07/17/2012      Chemistry      Component Value Date/Time   NA 137 07/17/2012 1357   NA 137 06/06/2012 0853   K 3.9 07/17/2012 1357   K 3.8 06/06/2012 0853   CL 104 07/17/2012 1357   CL 102 06/06/2012 0853   CO2 25 07/17/2012 1357   CO2 26 06/06/2012 0853   BUN 11.6 07/17/2012 1357   BUN 12 06/06/2012 0853   CREATININE 0.8 07/17/2012 1357   CREATININE 0.70 06/06/2012 0853   CREATININE 0.85 04/24/2009 1114      Component Value Date/Time    CALCIUM 9.0 07/17/2012 1357   CALCIUM 9.3 06/06/2012 0853   ALKPHOS 49 07/17/2012 1357   ALKPHOS 35* 06/06/2012 0853   AST 19 07/17/2012 1357   AST 14 06/06/2012 0853   ALT 9 07/17/2012 1357   ALT 9 06/06/2012 0853   BILITOT 0.36 07/17/2012 1357   BILITOT 0.8 06/06/2012 0853     ADDITIONAL INFORMATION: PROGNOSTIC INDICATORS - ACIS Results: IMMUNOHISTOCHEMICAL AND MORPHOMETRIC ANALYSIS BY THE AUTOMATED CELLULAR IMAGING SYSTEM (ACIS) Estrogen Receptor: 0%, NEGATIVE Progesterone Receptor: 0%, NEGATIVE Proliferation Marker Ki67: 95% COMMENT: The negative hormone receptor study(ies) in this case have an internal positive control. REFERENCE RANGE ESTROGEN RECEPTOR NEGATIVE <1% POSITIVE =>1% PROGESTERONE RECEPTOR NEGATIVE <1% POSITIVE =>1% All controls stained appropriately JOSHUA [facility]  MD Pathologist, Electronic Signature ( Signed 06/28/2012) CHROMOGENIC IN-SITU HYBRIDIZATION Results: HER-2/NEU BY CISH - NO AMPLIFICATION OF HER-2 DETECTED. RESULT RATIO OF HER2: CEP 17 SIGNALS 1.26 1 of 3 Duplicate copy FINAL for PRISCILLE, SHADDUCK [phone]) ADDITIONAL INFORMATION:(continued) AVERAGE HER2 COPY NUMBER PER CELL 2.45 REFERENCE RANGE NEGATIVE HER2/Chr17 Ratio <2.0 and Average HER2 copy number <4.0 EQUIVOCAL HER2/Chr17 Ratio <2.0 and Average HER2 copy number 4.0 and <6.0 POSITIVE HER2/Chr17 Ratio >=2.0 and/or Average HER2 copy number >=6.0 Jimmy Picket MD Pathologist, Electronic Signature ( Signed 06/26/2012) FINAL DIAGNOSIS Diagnosis Breast, right, needle core biopsy, 3:00 position - INVASIVE DUCTAL CARCINOMA SEE COMMENT. - DUCTAL CARCINOMA IN SITU Microscopic Comment Although the grade of tumor is best assessed at resection, with these biopsies, both the invasive and in situ carcinoma are grade III. Breast prognostic studies are pending and will be reported in an addendum. The case is reviewed with Dr. Raynald Blend who concurs. (CRR:gt, 06/21/12) [place] RUND DO Pathologist,  Electronic FINAL DIAGNOSIS Diagnosis Breast, right, needle core biopsy, mass - BENIGN BREAST TISSUE WITH FOCAL PSEUDOANGIOMATOUS STROMAL HYPERPLASIA (PASH). NO EVIDENCE OF MALIGNANCY. Microscopic Comment Called to the [facility] on 07/13/12. (JDP:caf 07/13/12)  RADIOGRAPHIC STUDIES:  Dg Chest 2 View  07/04/2012   *RADIOLOGY REPORT*  Clinical Data: Insertion of Port-A-Cath.  Breast cancer. Hypertension.  CHEST - 2 VIEW  Comparison: CT of the chest on 07/03/2012  Findings: Cardiomediastinal silhouette is within normal limits. The lungs are free of focal consolidations and pleural effusions. Bony structures have a normal appearance.  Contrast is identified within the colon following CT of the abdomen.  IMPRESSION: Negative exam.   Original Report Authenticated By: Norva Pavlov, M.D.   Ct Chest W Contrast  07/03/2012   *RADIOLOGY REPORT*  Clinical Data:  Breast cancer.  No therapy yet.  Lower inner quadrant of right breast.  CT CHEST, ABDOMEN AND PELVIS WITH CONTRAST  Technique: Contiguous axial images of the chest abdomen and pelvis were obtained after IV contrast administration.  Contrast: 80 ml Omnipaque-300  Comparison: Breast MR of 06/26/2012.  No prior CTs.  CT CHEST  Findings: Lung windows demonstrate minimal left lower lobe subpleural nodularity at 3 mm on image 40/series 4.  Right lung clear.  Soft tissue windows demonstrate small bilateral axillary nodes, without adenopathy.  No subpectoral adenopathy. No supraclavicular adenopathy.  Normal heart size, without pericardial effusion.  No central pulmonary embolism, on this non-dedicated study.  No mediastinal or hilar adenopathy.  No internal mammary adenopathy. Minimal residual thymic tissue in the anterior mediastinum (image 17/series 2).  IMPRESSION:  1. No acute process or evidence of metastatic disease in the chest. 2.  Probable subpleural lymph node in the left lower lobe. Recommend attention on follow-up.  CT ABDOMEN AND  PELVIS  Findings:  Mild early phase of hepatic enhancement which decreases sensitivity for focal liver lesions.  None identified.  Normal spleen, stomach, pancreas, gallbladder, biliary tract, adrenal glands, kidneys. No retroperitoneal or retrocrural adenopathy.  Normal colon, appendix, and terminal ileum.  Normal small bowel without abdominal ascites.  No pelvic adenopathy.    Normal urinary bladder and uterus.  No adnexal mass.  Trace free pelvic fluid is likely physiologic.  Transitional right-sided lumbosacral anatomy.  Degenerative disc disease at the lumbosacral junction.  IMPRESSION:  No acute process or evidence of metastatic disease in the abdomen or pelvis.   Original Report Authenticated By: Jeronimo Greaves, M.D.   US Breast Right  06/29/2012   *RADIOLOGY REPORT*  Clinical Data:  52 year old female with newly diagnosed right breast cancer.  Suspicious abnormal enhancement anterior and superior to the biopsy-proven neoplasm - for second look ultrasound evaluation.  RIGHT BREAST ULTRASOUND  Comparison:  Prior mammograms, ultrasounds and MRI.  Findings: Ultrasound is performed, showing the biopsy-proven neoplasm at the 3 o'clock position of the right breast 4 cm from the nipple. No other discrete sonographic abnormalities are identified, specifically medial, superior, and anterior to the biopsy-proven neoplasm.  IMPRESSION: Patchy abnormal enhancement medial superior to the biopsy-proven neoplasm identified on recent MRI is not visualized sonographically as a discrete abnormality.  MR guided biopsy is recommended if breast conservation surgery is considered.  BI-RADS CATEGORY 6:  Known biopsy-proven malignancy - appropriate action should be taken.  RECOMMENDATION: MR guided right breast biopsy, which will be scheduled by our office.  I have discussed the findings and recommendations with the patient. Results were also provided in writing at the conclusion of the visit.  If applicable, a reminder letter will  be sent to the patient regarding the next appointment.   Original Report Authenticated By: Harmon Pier, M.D.   US Breast Right  06/20/2012   *RADIOLOGY REPORT*  Clinical Data:  52 year old female for annual bilateral mammograms and palpable mass in the upper and inner right breast discovered on clinical examination.  DIGITAL DIAGNOSTIC BILATERAL MAMMOGRAM WITH CAD AND RIGHT BREAST ULTRASOUND:  Comparison:  03/21/2009 and prior mammograms dating back to 01/21/2005  Findings:  ACR Breast Density Category 4: The breast tissue is extremely dense.  Routine and spot compression views of the right breast demonstrate no evidence of suspicious mass or distortion. Scattered punctate calcifications within the breasts bilaterally are again identified.  Mammographic images were processed with CAD.  On physical exam, thickening is identified at the 12 o'clock position of the right breast 4 cm from the nipple. A firm palpable mass identified at the 3 o'clock position of the right breast 4 cm from the nipple.  Ultrasound is performed, showing no evidence of solid or cystic mass, distortion or abnormal areas of shadowing at the 12 o'clock position of the right breast. A 7 x 6 x 17 mm slightly irregular oval hypoechoic mass with calcifications is horizontally oriented at the 3 o'clock position of the right breast 4 cm from the nipple.  IMPRESSION: Indeterminate 7 x 6 x 17 mm hypoechoic mass at the 3 o'clock position of the right breast.  Tissue sampling is recommended.  No mammographic evidence of breast malignancy bilaterally.  No mammographic, palpable or sonographic abnormality at the 12 o'clock position of the right breast, in the area of palpable concern.  BI-RADS CATEGORY 4:  Suspicious abnormality - biopsy should be considered.  RECOMMENDATION:  Ultrasound guided right breast biopsy, which will be performed today but dictated in a separate report.  I have discussed the findings and recommendations with the patient. Results  were also provided in writing at the conclusion of the visit.  If applicable, a reminder letter will be sent to the patient regarding the next appointment.   Original Report Authenticated By: Harmon Pier, M.D.   Ct Abdomen Pelvis W Contrast  07/03/2012   *RADIOLOGY REPORT*  Clinical Data:  Breast cancer.  No therapy yet.  Lower inner quadrant of right breast.  CT CHEST, ABDOMEN AND PELVIS WITH CONTRAST  Technique: Contiguous axial images of the chest abdomen and pelvis were obtained after IV contrast administration.  Contrast: 80 ml Omnipaque-300  Comparison: Breast MR of 06/26/2012.  No prior CTs.  CT CHEST  Findings: Lung windows demonstrate minimal left lower lobe subpleural nodularity at 3 mm on image 40/series 4.  Right lung clear.  Soft tissue windows demonstrate small bilateral axillary nodes, without adenopathy.  No subpectoral adenopathy. No supraclavicular adenopathy.  Normal heart size, without pericardial effusion.  No central pulmonary embolism, on this non-dedicated study.  No mediastinal or hilar adenopathy.  No internal mammary adenopathy. Minimal residual thymic tissue in the anterior mediastinum (image 17/series 2).  IMPRESSION:  1. No acute process or evidence of metastatic disease in the chest. 2.  Probable subpleural lymph node in the left lower lobe. Recommend attention on follow-up.  CT ABDOMEN AND PELVIS  Findings:  Mild early phase of hepatic enhancement which decreases sensitivity for focal liver lesions.  None identified.  Normal spleen, stomach, pancreas, gallbladder, biliary tract, adrenal glands, kidneys. No retroperitoneal or retrocrural adenopathy.  Normal colon, appendix, and terminal ileum.  Normal small bowel without abdominal ascites.  No pelvic adenopathy.    Normal urinary bladder and uterus.  No adnexal mass.  Trace free pelvic fluid is likely physiologic.  Transitional right-sided lumbosacral anatomy.  Degenerative disc disease at the lumbosacral junction.  IMPRESSION:  No  acute process or evidence of metastatic disease in the abdomen or pelvis.   Original Report Authenticated By: Jeronimo Greaves, M.D.   Mr Breast Right W Wo Contrast  07/12/2012   *RADIOLOGY REPORT*  Clinical Data:  Known invasive mammary carcinoma in the right breast at 3 o'clock posteriorly.  Additional site of abnormal enhancement in the right upper inner quadrant closer to the nipple for possible biopsy.  ATTEMPTED MRI GUIDED VACUUM ASSISTED BIOPSY OF THE RIGHT BREAST WITHOUT AND WITH CONTRAST  Comparison: Previous exams.  Technique: Multiplanar, multisequence MR images of the right breast were obtained prior to and following the intravenous administration of 9 ml of Mulithance.  I met with the patient, and we discussed the procedure of MRI guided biopsy, including risks, benefits, and alternatives. Specifically, we discussed the risks of infection, bleeding, tissue injury, clip migration, and inadequate sampling.  Informed, written consent was given.  Using sterile technique, 2% Lidocaine, MRI guidance, and a 9 gauge vacuum assisted device, biopsy was attempted using a mediolateral approach.  However, the procedure was unsuccessful due to the extremely thin breast compression.  IMPRESSION:  Unsuccessful MRI guided vacuum-assisted biopsy of the right breast.  Patient was sent to the [facility] for further evaluation with ultrasound.   Original Report Authenticated By: Cain Saupe, M.D.   Mr Breast Bilateral W Wo Contrast  06/26/2012   *RADIOLOGY REPORT*  Clinical Data: History of new diagnosis of breast cancer from recent ultrasound guided core needle biopsy right breast 06/20/2012 demonstrating invasive ductal carcinoma with DCIS.  GFR 89, creatinine 0.7 on 06/06/2012.  BILATERAL BREAST MRI WITH AND WITHOUT CONTRAST  Technique: Multiplanar, multisequence MR images of both breasts were obtained prior to and following the intravenous administration of 10ml of multihance.  Three dimensional images were  evaluated at the independent DynaCad workstation.  Comparison:  Recent mammograms and ultrasound.  Findings: Examination demonstrates mild background parenchymal enhancement.  Right breast:  There is evidence of an ovoid gently lobulated and somewhat ill-defined heterogeneously enhancing mass over the deep third of the inner midportion of the right breast representing patient's biopsy-proven malignancy.  This mass contains central clip artifact and measures approximately 0.9 x 1.9 x 1.0 cm. The anterior border of this mass is located 5.5 cm from the nipple. There is a 6 mm slightly ill-defined  enhancing ovoid mass 6 mm posterior lateral to the biopsy-proven malignancy lying just superficial to the pectoralis muscle.  There is also clumped non mass enhancement anterior and slightly superior to the biopsy- proven malignancy in the middle third at approximately the 2 o'clock position.  This measures approximately 0.8 x 1.6 x 1.2 cm and is located 3 cm from the nipple.  This is suspicious for multifocal disease.  No suspicious axillary or internal mammary lymph nodes.  Left breast:  No suspicious mass, enhancement or adenopathy.  IMPRESSION: Known biopsy-proven malignancy over the deep third of the inner mid right breast.  6 mm ill-defined enhancing ovoid mass 6 mm posterior lateral to the biopsy-proven malignancy and lying just superficial to the pectoralis muscle.  Clumped non mass enhancement anterior and superior to patient's known malignancy at approximately the 2 o'clock position measuring 0.8 x 1.6 x 2.2 cm.  Findings likely represent multi focal disease.  RECOMMENDATION: Recommend second look ultrasound and core biopsy if feasible of the clumped non mass enhancement at approximately the 2 o'clock position right breast anterior/superior to the known malignancy. If not seen by ultrasound, recommend proceeding to MR guided biopsy of this suspicious abnormality.  THREE-DIMENSIONAL MR IMAGE RENDERING ON INDEPENDENT  WORKSTATION:  Three-dimensional MR images were rendered by post-processing of the original MR data on an independent workstation.  The three- dimensional MR images were interpreted, and findings were reported in the accompanying complete MRI report for this study.  BI-RADS CATEGORY 0:  Incomplete.  Need additional imaging evaluation and/or prior mammograms for comparison.   Original Report Authenticated By: Elberta Fortis, M.D.   Dg Chest Port 1 View  07/06/2012   *RADIOLOGY REPORT*  Clinical Data: Post left-sided port catheter insertion  PORTABLE CHEST - 1 VIEW  Comparison: 07/04/2012  Findings:  Grossly unchanged cardiac silhouette and mediastinal contours. Interval placement of a left anterior chest wall subclavian vein approach port-a-catheter with tip projecting over the superior cavoatrial junction. Grossly unchanged symmetric biapical pleural parenchymal thickening.  No pneumothorax.  No focal airspace opacities.  No pleural effusion.  No evidence of edema.  Unchanged bones.  IMPRESSION: Interval placement of a left anterior chest wall subclavian vein approach port-a-catheter with tip overlying the superior cavoatrial junction.  No pneumothorax.   Original Report Authenticated By: Tacey Ruiz, MD   Mm Digital Diagnostic Bilat  06/20/2012   *RADIOLOGY REPORT*  Clinical Data:  52 year old female for annual bilateral mammograms and palpable mass in the upper and inner right breast discovered on clinical examination.  DIGITAL DIAGNOSTIC BILATERAL MAMMOGRAM WITH CAD AND RIGHT BREAST ULTRASOUND:  Comparison:  03/21/2009 and prior mammograms dating back to 01/21/2005  Findings:  ACR Breast Density Category 4: The breast tissue is extremely dense.  Routine and spot compression views of the right breast demonstrate no evidence of suspicious mass or distortion. Scattered punctate calcifications within the breasts bilaterally are again identified.  Mammographic images were processed with CAD.  On physical exam,  thickening is identified at the 12 o'clock position of the right breast 4 cm from the nipple. A firm palpable mass identified at the 3 o'clock position of the right breast 4 cm from the nipple.  Ultrasound is performed, showing no evidence of solid or cystic mass, distortion or abnormal areas of shadowing at the 12 o'clock position of the right breast. A 7 x 6 x 17 mm slightly irregular oval hypoechoic mass with calcifications is horizontally oriented at the 3 o'clock position of the right breast 4 cm  from the nipple.  IMPRESSION: Indeterminate 7 x 6 x 17 mm hypoechoic mass at the 3 o'clock position of the right breast.  Tissue sampling is recommended.  No mammographic evidence of breast malignancy bilaterally.  No mammographic, palpable or sonographic abnormality at the 12 o'clock position of the right breast, in the area of palpable concern.  BI-RADS CATEGORY 4:  Suspicious abnormality - biopsy should be considered.  RECOMMENDATION:  Ultrasound guided right breast biopsy, which will be performed today but dictated in a separate report.  I have discussed the findings and recommendations with the patient. Results were also provided in writing at the conclusion of the visit.  If applicable, a reminder letter will be sent to the patient regarding the next appointment.   Original Report Authenticated By: Harmon Pier, M.D.   Mm Digital Diagnostic Unilat R  07/12/2012   *RADIOLOGY REPORT*  Clinical Data:  Ultrasound-guided core needle biopsy of a hypoechoic area at 2 o'clock 2 cm from the right nipple with clip placement.  Known invasive mammary carcinoma in the right breast. Unsuccessful right breast MRI guided biopsy earlier today.  DIGITAL DIAGNOSTIC RIGHT MAMMOGRAM  Comparison:  Previous exams.  Findings:  Films are performed following ultrasound guided biopsy of a small hypoechoic area at 2 o'clock 2 cm from the right nipple. The ribbon clip is positioned within the area of concern. The ribbon clip is  approximately 2.6 cm anterior to the top hat clip from the original biopsy on 06/20/2012.  IMPRESSION: Appropriate clip placement following ultrasound-guided core needle biopsy of a hypoechoic area at 2 o'clock 2 cm in the right nipple.   Original Report Authenticated By: Cain Saupe, M.D.   Mm Digital Diagnostic Unilat R  06/20/2012   *RADIOLOGY REPORT*  Clinical Data:  52 year old female - evaluate clip placement following ultrasound guided right breast biopsy.  DIGITAL DIAGNOSTIC RIGHT MAMMOGRAM  Comparison:  Previous exams.  Findings:  Films are performed following ultrasound guided biopsy of the 7 x 6 x 17 mm hypoechoic mass at the 3 o'clock position of the right breast 4 cm from the nipple. The T shaped biopsy clip is in satisfactory position.  IMPRESSION: Satisfactory clip placement following ultrasound guided right breast biopsy.  Pathology will be followed.   Original Report Authenticated By: Harmon Pier, M.D.   Dg Fluoro Guide Cv Line-no Report  07/06/2012   CLINICAL DATA: port placement   FLOURO GUIDE CV LINE  Fluoroscopy was utilized by the requesting physician.  No radiographic  interpretation.    [place] Rt Breast Bx W Loc Dev 1st Lesion Img Bx Spec US Guide  07/13/2012   **ADDENDUM** CREATED: 07/13/2012 14:11:45  I spoke with the patient by telephone on 07/13/2012 to discuss pathology results.  Pathology demonstrates  pseudoangiomatous stromal hyperplasia.  The findings were called to the office of Dr. Donell Beers. Although the imaging findings could be related to pseudoangiomatous stromal hyperplasia, excision of the anterior biopsy site is suggested at the time of patient's definitive surgery.  The patient is considering unilateral or bilateral mastectomy at this time.  The patient reports no problems at the biopsy site.  All questions were answered.  Recommendations:  Excision of the biopsy site. The patient is scheduled to begin chemotherapy in 4 days.  **END ADDENDUM** SIGNED BY: Blair Hailey.  Manson Passey, M.D.  07/12/2012   *RADIOLOGY REPORT*  Clinical Data:  Unsuccessful MRI guided biopsy of the area of abnormal enhancement in the left anterior upper inner quadrant, anterior to the known invasive mammary  carcinoma.  Second look ultrasound performed and a 5 x 4 x 4 mm hypoechoic area found at 2 o'clock 2 cm from the right nipple which will be biopsied.  ULTRASOUND GUIDED VACUUM ASSISTED CORE BIOPSY OF THE RIGHT BREAST  Sonography of the right upper inner quadrant was performed.  There is an ill-defined hypoechoic 5 x 4 x 4 mm area at 2 o'clock 2 cm from the right nipple which will be biopsied with ultrasound guidance.  The patient and I discussed the procedure of ultrasound-guided biopsy, including benefits and alternatives.  We discussed the high likelihood of a successful procedure. We discussed the risks of the procedure including infection, bleeding, tissue injury, clip migration, and inadequate sampling.  Written informed consent was given.  Using sterile technique, 2% lidocaine, ultrasound guidance, and a 12 gauge vacuum assisted needle, biopsy was performed of the hypoechoic area at 2 o'clock 2 cm from the right nipple using a caudocranial approach.  At the conclusion of the procedure, a within tissue marker clip was deployed into the biopsy cavity. Follow-up 2-view mammogram was performed and dictated separately.  IMPRESSION: Ultrasound-guided biopsy of a 5 x 4 x 4 mm hypoechoic area at 2 o'clock 2 cm from the right nipple.  No apparent complications.   Original Report Authenticated By: Cain Saupe, M.D.   [place] Rt Breast Bx W Loc Dev 1st Lesion Img Bx Spec US Guide  06/21/2012   *RADIOLOGY REPORT*  Clinical Data:  Indeterminate 7 x 6 x 17 mm hypoechoic mass in the inner right breast - for tissue sampling.  ULTRASOUND GUIDED VACUUM ASSISTED CORE BIOPSY OF THE RIGHT BREAST  Comparison: Previous exams.  I met with the patient and we discussed the procedure of ultrasound- guided biopsy, including  benefits and alternatives.  We discussed the high likelihood of a successful procedure. We discussed the risks of the procedure including infection, bleeding, tissue injury, clip migration, and inadequate sampling.  Informed written consent was given.  Using sterile technique, 2% lidocaine ultrasound guidance and a 12 gauge vacuum assisted needle biopsy was performed of the 7 x 6 x 17 mm hypoechoic mass at the 3 o'clock position of the right breast 4 cm from the nipple using a lateral approach.  At the conclusion of the procedure, a T shaped tissue marker clip was deployed into the biopsy cavity.  Follow-up 2-view mammogram was performed and dictated separately.  IMPRESSION: Ultrasound-guided biopsy of right breast mass.  No apparent complications.  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 Histology correlates with imaging findings.  The patient was contacted by phone on 06/21/2012 and these results given to her which she understood. Her questions were answered. She was encouraged to obtain breast cancer educational material from [facility] at her convenience. The patient had no complaints with her biopsy site.  Recommend surgery/oncology consultation.  An appointment at [facility] has been scheduled for 06/28/2012. Recommend bilateral breast MRI, which has been scheduled for 06/26/2012. The patient was informed of these appointments.   Original Report Authenticated By: Harmon Pier, M.D.    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 Patient had other areas seen on MRI biopsy of the second mass was negative. Patient has had her Port-A-Cath placed. She is proceeding neoadjuvant chemotherapy to possibly conserve her breast. Chemotherapy will consist of Adriamycin Cytoxan given dose dense for 4 cycles starting today 07/17/2012. She will receive Neulasta  support on day 2  for each cycle. When she completes this then she will begin weekly Taxol and carboplatinum for a total of 12 weeks.  #2 we discussed risks benefits and side effects of chemotherapy. She has had an echocardiogram as well as chemotherapy teaching class. Port-A-Cath was placed by Dr. Donell Beers. The port is functioning well.  #3 I educated and counseled her regarding antiemetic use. She does have all of her medications available to her. She understands how to take them.   PLAN:   #1 patient will proceed with cycle 1 day 1 of Adriamycin Cytoxan. She will return tomorrow for Neulasta injection. Prior to her Neulasta she will take Aleve and Claritin. Then continue the Claritin for another 3-5 days.  #2 patient will return in one week's time for followup and for labs.  #3 she knows to call me with any problems.   All questions were answered. The patient knows to call the clinic with any problems, questions or concerns. We can certainly see the patient much sooner if necessary.  I spent 25 minutes counseling the patient face to face. The total time spent in the appointment was 30 minutes.    Drue Second, MD Medical/Oncology [facility] [phone] (beeper) [phone] (Office)  07/17/2012, 8:49 PM

## 2012-07-18 ENCOUNTER — Telehealth: Payer: Self-pay | Admitting: Genetic Counselor

## 2012-07-18 ENCOUNTER — Telehealth: Payer: Self-pay | Admitting: *Deleted

## 2012-07-18 ENCOUNTER — Ambulatory Visit (HOSPITAL_BASED_OUTPATIENT_CLINIC_OR_DEPARTMENT_OTHER): Payer: Managed Care, Other (non HMO)

## 2012-07-18 VITALS — BP 136/86 | HR 104 | Temp 98.2°F

## 2012-07-18 DIAGNOSIS — C50311 Malignant neoplasm of lower-inner quadrant of right female breast: Secondary | ICD-10-CM

## 2012-07-18 DIAGNOSIS — Z5189 Encounter for other specified aftercare: Secondary | ICD-10-CM

## 2012-07-18 DIAGNOSIS — C50919 Malignant neoplasm of unspecified site of unspecified female breast: Secondary | ICD-10-CM

## 2012-07-18 MED ORDER — PEGFILGRASTIM INJECTION 6 MG/0.6ML
6.0000 mg | Freq: Once | SUBCUTANEOUS | Status: AC
  Administered 2012-07-18: 6 mg via SUBCUTANEOUS
  Filled 2012-07-18: qty 0.6

## 2012-07-18 NOTE — Telephone Encounter (Signed)
Chemo f/u while pt here for Neulasta injection.  Pt states that she slept without difficulties last night.  Denies any nausea or pain.  Bowels are unremarkable.  Taking fluids and eating with no difficulties.  Fluids encouraged.

## 2012-07-18 NOTE — Patient Instructions (Addendum)

## 2012-07-18 NOTE — Telephone Encounter (Signed)
Revealed likely benign variant in BRCA1 and that the remainder of her genetic testing is pending.

## 2012-07-18 NOTE — Telephone Encounter (Signed)
S/w pt earlier today and she denied any n/v/d or fevers.  See previous phone note.  Encouraged pt to drink plenty of fluids and call for any uncontrolled symptoms or any fever greater than 100.5 F. She verbalized understanding.

## 2012-07-18 NOTE — Telephone Encounter (Signed)
Pt reports she woke up w/ Vaginal Bleeding this morning,  Feels like her period and is actually time for her menstrual cycle.  She thought she was told that chemo would prevent her from having a period and she had her first chemo yesterday.  Informed pt that chemo can interrupt normal menstrual cycle and flow but not always.  Assured pt that she can still have a normal menstrual cycle, but it may change in the future after more cycles of chemo.  Notify us if her flow is significantly heavier than usual.  She verbalized understanding.  She denies any other concerns at this time. She has appt for Neulasta this afternoon.  Instructed her to inform injection nurse if any further questions/ concerns.

## 2012-07-24 ENCOUNTER — Encounter: Payer: Self-pay | Admitting: Oncology

## 2012-07-24 ENCOUNTER — Other Ambulatory Visit: Payer: Self-pay | Admitting: Oncology

## 2012-07-24 ENCOUNTER — Ambulatory Visit (HOSPITAL_BASED_OUTPATIENT_CLINIC_OR_DEPARTMENT_OTHER): Payer: Managed Care, Other (non HMO) | Admitting: Oncology

## 2012-07-24 ENCOUNTER — Other Ambulatory Visit (HOSPITAL_BASED_OUTPATIENT_CLINIC_OR_DEPARTMENT_OTHER): Payer: Managed Care, Other (non HMO) | Admitting: Lab

## 2012-07-24 VITALS — BP 131/81 | HR 87 | Temp 98.3°F | Resp 20 | Ht 63.0 in | Wt 117.4 lb

## 2012-07-24 DIAGNOSIS — C50311 Malignant neoplasm of lower-inner quadrant of right female breast: Secondary | ICD-10-CM

## 2012-07-24 DIAGNOSIS — C50319 Malignant neoplasm of lower-inner quadrant of unspecified female breast: Secondary | ICD-10-CM

## 2012-07-24 LAB — CBC WITH DIFFERENTIAL/PLATELET
Basophils Absolute: 0 10*3/uL (ref 0.0–0.1)
EOS%: 1.3 % (ref 0.0–7.0)
Eosinophils Absolute: 0 10*3/uL (ref 0.0–0.5)
HCT: 32.4 % — ABNORMAL LOW (ref 34.8–46.6)
HGB: 11.4 g/dL — ABNORMAL LOW (ref 11.6–15.9)
MCH: 32.1 pg (ref 25.1–34.0)
MONO#: 0 10*3/uL — ABNORMAL LOW (ref 0.1–0.9)
NEUT#: 0.1 10*3/uL — CL (ref 1.5–6.5)
NEUT%: 10 % — ABNORMAL LOW (ref 38.4–76.8)
RDW: 11.8 % (ref 11.2–14.5)
WBC: 0.8 10*3/uL — CL (ref 3.9–10.3)
lymph#: 0.7 10*3/uL — ABNORMAL LOW (ref 0.9–3.3)

## 2012-07-24 LAB — COMPREHENSIVE METABOLIC PANEL (CC13)
Albumin: 3.4 g/dL — ABNORMAL LOW (ref 3.5–5.0)
Alkaline Phosphatase: 57 U/L (ref 40–150)
Calcium: 9.2 mg/dL (ref 8.4–10.4)
Chloride: 100 mEq/L (ref 98–109)
Glucose: 77 mg/dl (ref 70–140)
Potassium: 3.5 mEq/L (ref 3.5–5.1)
Sodium: 137 mEq/L (ref 136–145)
Total Protein: 7.2 g/dL (ref 6.4–8.3)

## 2012-07-24 MED ORDER — CIPROFLOXACIN HCL 500 MG PO TABS: 500.0000 mg | ORAL_TABLET | Freq: Two times a day (BID) | ORAL | Status: DC

## 2012-07-24 NOTE — Patient Instructions (Addendum)
#  1 patient is neutropenic secondary to chemotherapy that was given to her last week. She has not had any fevers chills or night sweats.  #2 I have discussed neutropenic precautions with her. She will also begin Cipro 500 mg twice a day prophylactically.  #3 she did experience some right hip pain I do think it is due to overuse. She did take some oxycodone which helped her a little bit. I have recommended that if this happens again she is to take 2 oxycodone and then repeat x1.  # 4 patient will return in one week's time for followup and cycle 3 of her chemotherapy.   Patient Neutropenia Instruction Sheet  Diagnosis: Breast Cancer      Treating Physician: Drue Second, MD  Treatment: 1. Type of chemotherapy: AC 2. Date of last treatment: 6/23  Last Blood Counts: Lab Results  Component Value Date   WBC 0.8* 07/24/2012   HGB 11.4* 07/24/2012   HCT 32.4* 07/24/2012   MCV 91.3 07/24/2012   PLT 126* 07/24/2012        Prophylactic Antibiotics: Cipro 500 mg by mouth twice a day Instructions: 1. Monitor temperature and call if fever  greater than 100.5, chills, shaking chills (rigors) 2. Call Physician on-call at [phone] 3. Give him/her symptoms and list of medications that you are taking and your last blood count.

## 2012-07-24 NOTE — Progress Notes (Signed)
OFFICE PROGRESS NOTE  CC  [name], MD [address] [place] [address] Dr. Everardo Beals Dr. 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 Patient had a palpable mass at the 3:00 position. She was seen at the breast Center on 06/20/2012 she had a mammogram and ultrasound showing a 1.7 cm mass at the 3:00 position. 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 Tumor was grade 3.  #2 MRIs performed on 06/26/2012 showed no biopsy mass at 3:00 but also 2 other adjacent masses at 1 at 2:00 measuring 0.8 x 1.6 x 2.2 cm andalso 6 mm ill-defined mass posterior to the biopsy-proven malignancy.  #3 patient has gone on to have a Port-A-Cath placed to begin neoadjuvant chemotherapy initially consisting of Adriamycin Cytoxan to be given dose dense for 4 cycles followed by weekly Taxol and carboplatinum for a total of 12 weeks.carefully agent made to have an excellent response to neoadjuvant chemotherapy and could possibly have breast preservation of course she may even need chest mastectomy with sentinel lymph node biopsies. Patient and I had met at the [facility] clinic we discussed the rationale for upfront chemotherapy she is now here to receive that  CURRENT THERAPY: s/pcycle 1 day 8 of Adriamycin and Cytoxan  INTERVAL HISTORY: Jaime Murillo 52 y.o. female returns for followup visit today. Clinically patient seems to be doing well. She however is neutropenic. She has not had any fevers chills or night sweats no hot flashes. She has no peripheral paresthesias no bleeding problems. I do think that her breast with masses to look smaller. Today we discussed neutropenic precautions.  We also discussed bleeding precautions. Remainder of the 10 point review of systems is negative  MEDICAL HISTORY: Past Medical History  Diagnosis Date  . HSV infection   . NSVD (normal spontaneous vaginal delivery)   . Hypertension     takes HCTZ  . Urinary frequency   . Anemia     takes Ferrous Sulfate occasionally  . Breast cancer 2014    triple negative    ALLERGIES:  has No Known Allergies.  MEDICATIONS:  Current Outpatient Prescriptions  Medication Sig Dispense Refill  . ferrous sulfate 325 (65 FE) MG tablet Take 325 mg by mouth daily with breakfast.      . hydrochlorothiazide (HYDRODIURIL) 25 MG tablet Take 1 tablet (25 mg total) by mouth daily.  30 tablet  11  . lidocaine-prilocaine (EMLA) cream Apply 1 application topically as needed (apply to PAC site 1-2 hours prior to treatment.).      Marland Kitchen LORazepam (ATIVAN) 0.5 MG tablet Take 0.5 mg by mouth every 6 (six) hours as needed for anxiety (as needed for nausea and vomitting).      . ondansetron (ZOFRAN) 8 MG tablet Take 8 mg by mouth every 8 (eight) hours as needed for nausea (Take 1 pill by mouth twice a day as needed  for nausea and vomitting starting 3rd day after chemo).      Marland Kitchen oxyCODONE-acetaminophen (ROXICET) 5-325 MG per tablet Take 1-2 tablets by mouth every 4 (four) hours as needed for pain.  30 tablet  0  . prochlorperazine (COMPAZINE) 10 MG tablet Take 10 mg by mouth every 6 (six) hours as needed (  for nausea and vomitting).      . valACYclovir (VALTREX) 500 MG tablet Take 500 mg by mouth daily as needed. For herpes      . ciprofloxacin (CIPRO) 500 MG tablet Take 1 tablet (500 mg total) by mouth 2 (two) times daily.  14 tablet  5  . dexamethasone (DECADRON) 4 MG tablet Take 4 mg by mouth 2 (two) times daily with a meal. Take 2 tabs every day on the day after chemo an then take 2 tabs twice a day with food for 2 days.      . prochlorperazine (COMPAZINE) 25 MG suppository Place 25 mg rectally every 12 (twelve) hours as  needed for nausea (as needed for nausea and vomitting).       No current facility-administered medications for this visit.    SURGICAL HISTORY:  Past Surgical History  Procedure Laterality Date  . Tubal ligation  2003    LTL  . Portacath placement N/A 07/06/2012    Procedure: INSERTION PORT-A-CATH;  Surgeon: Almond Lint, MD;  Location: [facility] OR;  Service: General;  Laterality: N/A;    REVIEW OF SYSTEMS:  Pertinent items are noted in HPI.   HEALTH MAINTENANCE:   PHYSICAL EXAMINATION: Blood pressure 131/81, pulse 87, temperature 98.3 F (36.8 C), temperature source Oral, resp. rate 20, height 5\' 3"  (1.6 m), weight 117 lb 6.4 oz (53.252 kg), last menstrual period 06/19/2012. Body mass index is 20.8 kg/(m^2). ECOG PERFORMANCE STATUS: 0 - Asymptomatic   General appearance: alert, cooperative and appears stated age Resp: clear to auscultation bilaterally Cardio: regular rate and rhythm GI: soft, non-tender; bowel sounds normal; no masses,  no organomegaly Extremities: extremities normal, atraumatic, no cyanosis or edema Neurologic: Grossly normal Breast examination: Left breast no masses or nipple discharge Right breast reveals 3 discrete masses at the 3:00 position 2:00 position.  It is nontender there is no nipple discharge. Conglomeration of these masses to my exam measure about 3-4 cm.  LABORATORY DATA: Lab Results  Component Value Date   WBC 0.8* 07/24/2012   HGB 11.4* 07/24/2012   HCT 32.4* 07/24/2012   MCV 91.3 07/24/2012   PLT 126* 07/24/2012      Chemistry      Component Value Date/Time   NA 137 07/24/2012 1215   NA 137 06/06/2012 0853   K 3.5 07/24/2012 1215   K 3.8 06/06/2012 0853   CL 104 07/17/2012 1357   CL 102 06/06/2012 0853   CO2 31* 07/24/2012 1215   CO2 26 06/06/2012 0853   BUN 12.7 07/24/2012 1215   BUN 12 06/06/2012 0853   CREATININE 0.7 07/24/2012 1215   CREATININE 0.70 06/06/2012 0853   CREATININE 0.85 04/24/2009 1114      Component Value Date/Time   CALCIUM  9.2 07/24/2012 1215   CALCIUM 9.3 06/06/2012 0853   ALKPHOS 57 07/24/2012 1215   ALKPHOS 35* 06/06/2012 0853   AST 15 07/24/2012 1215   AST 14 06/06/2012 0853   ALT 18 07/24/2012 1215   ALT 9 06/06/2012 0853   BILITOT 0.85 07/24/2012 1215   BILITOT 0.8 06/06/2012 0853     ADDITIONAL INFORMATION: PROGNOSTIC INDICATORS - ACIS Results: IMMUNOHISTOCHEMICAL AND MORPHOMETRIC ANALYSIS BY THE AUTOMATED CELLULAR IMAGING SYSTEM (ACIS) Estrogen Receptor: 0%, NEGATIVE Progesterone Receptor: 0%, NEGATIVE Proliferation Marker Ki67: 95% COMMENT: The negative hormone receptor study(ies) in this case have an internal positive control. REFERENCE RANGE ESTROGEN RECEPTOR NEGATIVE <1% POSITIVE =>1% PROGESTERONE RECEPTOR NEGATIVE <1% POSITIVE =>1% All controls stained appropriately Pecola Leisure MD  Pathologist, Electronic Signature ( Signed 06/28/2012) CHROMOGENIC IN-SITU HYBRIDIZATION Results: HER-2/NEU BY CISH - NO AMPLIFICATION OF HER-2 DETECTED. RESULT RATIO OF HER2: CEP 17 SIGNALS 1.26 1 of 3 Duplicate copy FINAL for BRIELLA, HOBDAY [id number]) ADDITIONAL INFORMATION:(continued) AVERAGE HER2 COPY NUMBER PER CELL 2.45 REFERENCE RANGE NEGATIVE HER2/Chr17 Ratio <2.0 and Average HER2 copy number <4.0 EQUIVOCAL HER2/Chr17 Ratio <2.0 and Average HER2 copy number 4.0 and <6.0 POSITIVE HER2/Chr17 Ratio >=2.0 and/or Average HER2 copy number >=6.0 Jimmy Picket MD Pathologist, Electronic Signature ( Signed 06/26/2012) FINAL DIAGNOSIS Diagnosis Breast, right, needle core biopsy, 3:00 position - INVASIVE DUCTAL CARCINOMA SEE COMMENT. - DUCTAL CARCINOMA IN SITU Microscopic Comment Although the grade of tumor is best assessed at resection, with these biopsies, both the invasive and in situ carcinoma are grade III. Breast prognostic studies are pending and will be reported in an addendum. The case is reviewed with Dr. Raynald Blend who concurs. (CRR:gt, 06/21/12) [place] RUND DO Pathologist, Electronic FINAL  DIAGNOSIS Diagnosis Breast, right, needle core biopsy, mass - BENIGN BREAST TISSUE WITH FOCAL PSEUDOANGIOMATOUS STROMAL HYPERPLASIA (PASH). NO EVIDENCE OF MALIGNANCY. Microscopic Comment Called to the [facility] on 07/13/12. (JDP:caf 07/13/12)  RADIOGRAPHIC STUDIES:  Dg Chest 2 View  07/04/2012   *RADIOLOGY REPORT*  Clinical Data: Insertion of Port-A-Cath.  Breast cancer. Hypertension.  CHEST - 2 VIEW  Comparison: CT of the chest on 07/03/2012  Findings: Cardiomediastinal silhouette is within normal limits. The lungs are free of focal consolidations and pleural effusions. Bony structures have a normal appearance.  Contrast is identified within the colon following CT of the abdomen.  IMPRESSION: Negative exam.   Original Report Authenticated By: Norva Pavlov, M.D.   Ct Chest W Contrast  07/03/2012   *RADIOLOGY REPORT*  Clinical Data:  Breast cancer.  No therapy yet.  Lower inner quadrant of right breast.  CT CHEST, ABDOMEN AND PELVIS WITH CONTRAST  Technique: Contiguous axial images of the chest abdomen and pelvis were obtained after IV contrast administration.  Contrast: 80 ml Omnipaque-300  Comparison: Breast MR of 06/26/2012.  No prior CTs.  CT CHEST  Findings: Lung windows demonstrate minimal left lower lobe subpleural nodularity at 3 mm on image 40/series 4.  Right lung clear.  Soft tissue windows demonstrate small bilateral axillary nodes, without adenopathy.  No subpectoral adenopathy. No supraclavicular adenopathy.  Normal heart size, without pericardial effusion.  No central pulmonary embolism, on this non-dedicated study.  No mediastinal or hilar adenopathy.  No internal mammary adenopathy. Minimal residual thymic tissue in the anterior mediastinum (image 17/series 2).  IMPRESSION:  1. No acute process or evidence of metastatic disease in the chest. 2.  Probable subpleural lymph node in the left lower lobe. Recommend attention on follow-up.  CT ABDOMEN AND PELVIS  Findings:   Mild early phase of hepatic enhancement which decreases sensitivity for focal liver lesions.  None identified.  Normal spleen, stomach, pancreas, gallbladder, biliary tract, adrenal glands, kidneys. No retroperitoneal or retrocrural adenopathy.  Normal colon, appendix, and terminal ileum.  Normal small bowel without abdominal ascites.  No pelvic adenopathy.    Normal urinary bladder and uterus.  No adnexal mass.  Trace free pelvic fluid is likely physiologic.  Transitional right-sided lumbosacral anatomy.  Degenerative disc disease at the lumbosacral junction.  IMPRESSION:  No acute process or evidence of metastatic disease in the abdomen or pelvis.   Original Report Authenticated By: Jeronimo Greaves, M.D.   US Breast Right  06/29/2012   *RADIOLOGY REPORT*  Clinical Data:  52 year old  female with newly diagnosed right breast cancer.  Suspicious abnormal enhancement anterior and superior to the biopsy-proven neoplasm - for second look ultrasound evaluation.  RIGHT BREAST ULTRASOUND  Comparison:  Prior mammograms, ultrasounds and MRI.  Findings: Ultrasound is performed, showing the biopsy-proven neoplasm at the 3 o'clock position of the right breast 4 cm from the nipple. No other discrete sonographic abnormalities are identified, specifically medial, superior, and anterior to the biopsy-proven neoplasm.  IMPRESSION: Patchy abnormal enhancement medial superior to the biopsy-proven neoplasm identified on recent MRI is not visualized sonographically as a discrete abnormality.  MR guided biopsy is recommended if breast conservation surgery is considered.  BI-RADS CATEGORY 6:  Known biopsy-proven malignancy - appropriate action should be taken.  RECOMMENDATION: MR guided right breast biopsy, which will be scheduled by our office.  I have discussed the findings and recommendations with the patient. Results were also provided in writing at the conclusion of the visit.  If applicable, a reminder letter will be sent to the  patient regarding the next appointment.   Original Report Authenticated By: Harmon Pier, M.D.   US Breast Right  06/20/2012   *RADIOLOGY REPORT*  Clinical Data:  52 year old female for annual bilateral mammograms and palpable mass in the upper and inner right breast discovered on clinical examination.  DIGITAL DIAGNOSTIC BILATERAL MAMMOGRAM WITH CAD AND RIGHT BREAST ULTRASOUND:  Comparison:  03/21/2009 and prior mammograms dating back to 01/21/2005  Findings:  ACR Breast Density Category 4: The breast tissue is extremely dense.  Routine and spot compression views of the right breast demonstrate no evidence of suspicious mass or distortion. Scattered punctate calcifications within the breasts bilaterally are again identified.  Mammographic images were processed with CAD.  On physical exam, thickening is identified at the 12 o'clock position of the right breast 4 cm from the nipple. A firm palpable mass identified at the 3 o'clock position of the right breast 4 cm from the nipple.  Ultrasound is performed, showing no evidence of solid or cystic mass, distortion or abnormal areas of shadowing at the 12 o'clock position of the right breast. A 7 x 6 x 17 mm slightly irregular oval hypoechoic mass with calcifications is horizontally oriented at the 3 o'clock position of the right breast 4 cm from the nipple.  IMPRESSION: Indeterminate 7 x 6 x 17 mm hypoechoic mass at the 3 o'clock position of the right breast.  Tissue sampling is recommended.  No mammographic evidence of breast malignancy bilaterally.  No mammographic, palpable or sonographic abnormality at the 12 o'clock position of the right breast, in the area of palpable concern.  BI-RADS CATEGORY 4:  Suspicious abnormality - biopsy should be considered.  RECOMMENDATION:  Ultrasound guided right breast biopsy, which will be performed today but dictated in a separate report.  I have discussed the findings and recommendations with the patient. Results were also  provided in writing at the conclusion of the visit.  If applicable, a reminder letter will be sent to the patient regarding the next appointment.   Original Report Authenticated By: Harmon Pier, M.D.   Ct Abdomen Pelvis W Contrast  07/03/2012   *RADIOLOGY REPORT*  Clinical Data:  Breast cancer.  No therapy yet.  Lower inner quadrant of right breast.  CT CHEST, ABDOMEN AND PELVIS WITH CONTRAST  Technique: Contiguous axial images of the chest abdomen and pelvis were obtained after IV contrast administration.  Contrast: 80 ml Omnipaque-300  Comparison: Breast MR of 06/26/2012.  No prior CTs.  CT CHEST  Findings: Lung windows demonstrate minimal left lower lobe subpleural nodularity at 3 mm on image 40/series 4.  Right lung clear.  Soft tissue windows demonstrate small bilateral axillary nodes, without adenopathy.  No subpectoral adenopathy. No supraclavicular adenopathy.  Normal heart size, without pericardial effusion.  No central pulmonary embolism, on this non-dedicated study.  No mediastinal or hilar adenopathy.  No internal mammary adenopathy. Minimal residual thymic tissue in the anterior mediastinum (image 17/series 2).  IMPRESSION:  1. No acute process or evidence of metastatic disease in the chest. 2.  Probable subpleural lymph node in the left lower lobe. Recommend attention on follow-up.  CT ABDOMEN AND PELVIS  Findings:  Mild early phase of hepatic enhancement which decreases sensitivity for focal liver lesions.  None identified.  Normal spleen, stomach, pancreas, gallbladder, biliary tract, adrenal glands, kidneys. No retroperitoneal or retrocrural adenopathy.  Normal colon, appendix, and terminal ileum.  Normal small bowel without abdominal ascites.  No pelvic adenopathy.    Normal urinary bladder and uterus.  No adnexal mass.  Trace free pelvic fluid is likely physiologic.  Transitional right-sided lumbosacral anatomy.  Degenerative disc disease at the lumbosacral junction.  IMPRESSION:  No acute  process or evidence of metastatic disease in the abdomen or pelvis.   Original Report Authenticated By: Jeronimo Greaves, M.D.   Mr Breast Right W Wo Contrast  07/12/2012   *RADIOLOGY REPORT*  Clinical Data:  Known invasive mammary carcinoma in the right breast at 3 o'clock posteriorly.  Additional site of abnormal enhancement in the right upper inner quadrant closer to the nipple for possible biopsy.  ATTEMPTED MRI GUIDED VACUUM ASSISTED BIOPSY OF THE RIGHT BREAST WITHOUT AND WITH CONTRAST  Comparison: Previous exams.  Technique: Multiplanar, multisequence MR images of the right breast were obtained prior to and following the intravenous administration of 9 ml of Mulithance.  I met with the patient, and we discussed the procedure of MRI guided biopsy, including risks, benefits, and alternatives. Specifically, we discussed the risks of infection, bleeding, tissue injury, clip migration, and inadequate sampling.  Informed, written consent was given.  Using sterile technique, 2% Lidocaine, MRI guidance, and a 9 gauge vacuum assisted device, biopsy was attempted using a mediolateral approach.  However, the procedure was unsuccessful due to the extremely thin breast compression.  IMPRESSION:  Unsuccessful MRI guided vacuum-assisted biopsy of the right breast.  Patient was sent to the [facility] for further evaluation with ultrasound.   Original Report Authenticated By: Cain Saupe, M.D.   Mr Breast Bilateral W Wo Contrast  06/26/2012   *RADIOLOGY REPORT*  Clinical Data: History of new diagnosis of breast cancer from recent ultrasound guided core needle biopsy right breast 06/20/2012 demonstrating invasive ductal carcinoma with DCIS.  GFR 89, creatinine 0.7 on 06/06/2012.  BILATERAL BREAST MRI WITH AND WITHOUT CONTRAST  Technique: Multiplanar, multisequence MR images of both breasts were obtained prior to and following the intravenous administration of 10ml of multihance.  Three dimensional images were evaluated  at the independent DynaCad workstation.  Comparison:  Recent mammograms and ultrasound.  Findings: Examination demonstrates mild background parenchymal enhancement.  Right breast:  There is evidence of an ovoid gently lobulated and somewhat ill-defined heterogeneously enhancing mass over the deep third of the inner midportion of the right breast representing patient's biopsy-proven malignancy.  This mass contains central clip artifact and measures approximately 0.9 x 1.9 x 1.0 cm. The anterior border of this mass is located 5.5 cm from the nipple. There is a 6 mm slightly ill-defined  enhancing ovoid mass 6 mm posterior lateral to the biopsy-proven malignancy lying just superficial to the pectoralis muscle.  There is also clumped non mass enhancement anterior and slightly superior to the biopsy- proven malignancy in the middle third at approximately the 2 o'clock position.  This measures approximately 0.8 x 1.6 x 1.2 cm and is located 3 cm from the nipple.  This is suspicious for multifocal disease.  No suspicious axillary or internal mammary lymph nodes.  Left breast:  No suspicious mass, enhancement or adenopathy.  IMPRESSION: Known biopsy-proven malignancy over the deep third of the inner mid right breast.  6 mm ill-defined enhancing ovoid mass 6 mm posterior lateral to the biopsy-proven malignancy and lying just superficial to the pectoralis muscle.  Clumped non mass enhancement anterior and superior to patient's known malignancy at approximately the 2 o'clock position measuring 0.8 x 1.6 x 2.2 cm.  Findings likely represent multi focal disease.  RECOMMENDATION: Recommend second look ultrasound and core biopsy if feasible of the clumped non mass enhancement at approximately the 2 o'clock position right breast anterior/superior to the known malignancy. If not seen by ultrasound, recommend proceeding to MR guided biopsy of this suspicious abnormality.  THREE-DIMENSIONAL MR IMAGE RENDERING ON INDEPENDENT  WORKSTATION:  Three-dimensional MR images were rendered by post-processing of the original MR data on an independent workstation.  The three- dimensional MR images were interpreted, and findings were reported in the accompanying complete MRI report for this study.  BI-RADS CATEGORY 0:  Incomplete.  Need additional imaging evaluation and/or prior mammograms for comparison.   Original Report Authenticated By: Elberta Fortis, M.D.   Dg Chest Port 1 View  07/06/2012   *RADIOLOGY REPORT*  Clinical Data: Post left-sided port catheter insertion  PORTABLE CHEST - 1 VIEW  Comparison: 07/04/2012  Findings:  Grossly unchanged cardiac silhouette and mediastinal contours. Interval placement of a left anterior chest wall subclavian vein approach port-a-catheter with tip projecting over the superior cavoatrial junction. Grossly unchanged symmetric biapical pleural parenchymal thickening.  No pneumothorax.  No focal airspace opacities.  No pleural effusion.  No evidence of edema.  Unchanged bones.  IMPRESSION: Interval placement of a left anterior chest wall subclavian vein approach port-a-catheter with tip overlying the superior cavoatrial junction.  No pneumothorax.   Original Report Authenticated By: Tacey Ruiz, MD   Mm Digital Diagnostic Bilat  06/20/2012   *RADIOLOGY REPORT*  Clinical Data:  52 year old female for annual bilateral mammograms and palpable mass in the upper and inner right breast discovered on clinical examination.  DIGITAL DIAGNOSTIC BILATERAL MAMMOGRAM WITH CAD AND RIGHT BREAST ULTRASOUND:  Comparison:  03/21/2009 and prior mammograms dating back to 01/21/2005  Findings:  ACR Breast Density Category 4: The breast tissue is extremely dense.  Routine and spot compression views of the right breast demonstrate no evidence of suspicious mass or distortion. Scattered punctate calcifications within the breasts bilaterally are again identified.  Mammographic images were processed with CAD.  On physical exam,  thickening is identified at the 12 o'clock position of the right breast 4 cm from the nipple. A firm palpable mass identified at the 3 o'clock position of the right breast 4 cm from the nipple.  Ultrasound is performed, showing no evidence of solid or cystic mass, distortion or abnormal areas of shadowing at the 12 o'clock position of the right breast. A 7 x 6 x 17 mm slightly irregular oval hypoechoic mass with calcifications is horizontally oriented at the 3 o'clock position of the right breast 4 cm  from the nipple.  IMPRESSION: Indeterminate 7 x 6 x 17 mm hypoechoic mass at the 3 o'clock position of the right breast.  Tissue sampling is recommended.  No mammographic evidence of breast malignancy bilaterally.  No mammographic, palpable or sonographic abnormality at the 12 o'clock position of the right breast, in the area of palpable concern.  BI-RADS CATEGORY 4:  Suspicious abnormality - biopsy should be considered.  RECOMMENDATION:  Ultrasound guided right breast biopsy, which will be performed today but dictated in a separate report.  I have discussed the findings and recommendations with the patient. Results were also provided in writing at the conclusion of the visit.  If applicable, a reminder letter will be sent to the patient regarding the next appointment.   Original Report Authenticated By: Harmon Pier, M.D.   Mm Digital Diagnostic Unilat R  07/12/2012   *RADIOLOGY REPORT*  Clinical Data:  Ultrasound-guided core needle biopsy of a hypoechoic area at 2 o'clock 2 cm from the right nipple with clip placement.  Known invasive mammary carcinoma in the right breast. Unsuccessful right breast MRI guided biopsy earlier today.  DIGITAL DIAGNOSTIC RIGHT MAMMOGRAM  Comparison:  Previous exams.  Findings:  Films are performed following ultrasound guided biopsy of a small hypoechoic area at 2 o'clock 2 cm from the right nipple. The ribbon clip is positioned within the area of concern. The ribbon clip is  approximately 2.6 cm anterior to the top hat clip from the original biopsy on 06/20/2012.  IMPRESSION: Appropriate clip placement following ultrasound-guided core needle biopsy of a hypoechoic area at 2 o'clock 2 cm in the right nipple.   Original Report Authenticated By: Cain Saupe, M.D.   Mm Digital Diagnostic Unilat R  06/20/2012   *RADIOLOGY REPORT*  Clinical Data:  52 year old female - evaluate clip placement following ultrasound guided right breast biopsy.  DIGITAL DIAGNOSTIC RIGHT MAMMOGRAM  Comparison:  Previous exams.  Findings:  Films are performed following ultrasound guided biopsy of the 7 x 6 x 17 mm hypoechoic mass at the 3 o'clock position of the right breast 4 cm from the nipple. The T shaped biopsy clip is in satisfactory position.  IMPRESSION: Satisfactory clip placement following ultrasound guided right breast biopsy.  Pathology will be followed.   Original Report Authenticated By: Harmon Pier, M.D.   Dg Fluoro Guide Cv Line-no Report  07/06/2012   CLINICAL DATA: port placement   FLOURO GUIDE CV LINE  Fluoroscopy was utilized by the requesting physician.  No radiographic  interpretation.    [place] Rt Breast Bx W Loc Dev 1st Lesion Img Bx Spec US Guide  07/13/2012   **ADDENDUM** CREATED: 07/13/2012 14:11:45  I spoke with the patient by telephone on 07/13/2012 to discuss pathology results.  Pathology demonstrates  pseudoangiomatous stromal hyperplasia.  The findings were called to the office of Dr. Donell Beers. Although the imaging findings could be related to pseudoangiomatous stromal hyperplasia, excision of the anterior biopsy site is suggested at the time of patient's definitive surgery.  The patient is considering unilateral or bilateral mastectomy at this time.  The patient reports no problems at the biopsy site.  All questions were answered.  Recommendations:  Excision of the biopsy site. The patient is scheduled to begin chemotherapy in 4 days.  **END ADDENDUM** SIGNED BY: Blair Hailey.  Manson Passey, M.D.  07/12/2012   *RADIOLOGY REPORT*  Clinical Data:  Unsuccessful MRI guided biopsy of the area of abnormal enhancement in the left anterior upper inner quadrant, anterior to the known invasive mammary  carcinoma.  Second look ultrasound performed and a 5 x 4 x 4 mm hypoechoic area found at 2 o'clock 2 cm from the right nipple which will be biopsied.  ULTRASOUND GUIDED VACUUM ASSISTED CORE BIOPSY OF THE RIGHT BREAST  Sonography of the right upper inner quadrant was performed.  There is an ill-defined hypoechoic 5 x 4 x 4 mm area at 2 o'clock 2 cm from the right nipple which will be biopsied with ultrasound guidance.  The patient and I discussed the procedure of ultrasound-guided biopsy, including benefits and alternatives.  We discussed the high likelihood of a successful procedure. We discussed the risks of the procedure including infection, bleeding, tissue injury, clip migration, and inadequate sampling.  Written informed consent was given.  Using sterile technique, 2% lidocaine, ultrasound guidance, and a 12 gauge vacuum assisted needle, biopsy was performed of the hypoechoic area at 2 o'clock 2 cm from the right nipple using a caudocranial approach.  At the conclusion of the procedure, a within tissue marker clip was deployed into the biopsy cavity. Follow-up 2-view mammogram was performed and dictated separately.  IMPRESSION: Ultrasound-guided biopsy of a 5 x 4 x 4 mm hypoechoic area at 2 o'clock 2 cm from the right nipple.  No apparent complications.   Original Report Authenticated By: Cain Saupe, M.D.   [place] Rt Breast Bx W Loc Dev 1st Lesion Img Bx Spec US Guide  06/21/2012   *RADIOLOGY REPORT*  Clinical Data:  Indeterminate 7 x 6 x 17 mm hypoechoic mass in the inner right breast - for tissue sampling.  ULTRASOUND GUIDED VACUUM ASSISTED CORE BIOPSY OF THE RIGHT BREAST  Comparison: Previous exams.  I met with the patient and we discussed the procedure of ultrasound- guided biopsy, including  benefits and alternatives.  We discussed the high likelihood of a successful procedure. We discussed the risks of the procedure including infection, bleeding, tissue injury, clip migration, and inadequate sampling.  Informed written consent was given.  Using sterile technique, 2% lidocaine ultrasound guidance and a 12 gauge vacuum assisted needle biopsy was performed of the 7 x 6 x 17 mm hypoechoic mass at the 3 o'clock position of the right breast 4 cm from the nipple using a lateral approach.  At the conclusion of the procedure, a T shaped tissue marker clip was deployed into the biopsy cavity.  Follow-up 2-view mammogram was performed and dictated separately.  IMPRESSION: Ultrasound-guided biopsy of right breast mass.  No apparent complications.  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 Histology correlates with imaging findings.  The patient was contacted by phone on 06/21/2012 and these results given to her which she understood. Her questions were answered. She was encouraged to obtain breast cancer educational material from [facility] at her convenience. The patient had no complaints with her biopsy site.  Recommend surgery/oncology consultation.  An appointment at the [facility] has been scheduled for 06/28/2012. Recommend bilateral breast MRI, which has been scheduled for 06/26/2012. The patient was informed of these appointments.   Original Report Authenticated By: Harmon Pier, M.D.    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 Patient had other areas seen on MRI biopsy of the second mass was negative. Patient has had her Port-A-Cath placed. She is proceeding neoadjuvant chemotherapy to possibly conserve her breast. Chemotherapy will consist of Adriamycin Cytoxan given dose dense for 4 cycles starting today 07/17/2012. She will receive Neulasta  support on day 2  for each cycle. When she completes this then she will begin weekly Taxol and carboplatinum for a total of 12 weeks.  #2 we discussed risks benefits and side effects of chemotherapy. She has had an echocardiogram as well as chemotherapy teaching class. Port-A-Cath was placed by Dr. Donell Beers. The port is functioning well.  #3 I educated and counseled her regarding antiemetic use. She does have all of her medications available to her. She understands how to take them.   PLAN:  #1 patient is neutropenic secondary to chemotherapy that was given to her last week. She has not had any fevers chills or night sweats.  #2 I have discussed neutropenic precautions with her. She will also begin Cipro 500 mg twice a day prophylactically.  #3 she did experience some right hip pain I do think it is due to overuse. She did take some oxycodone which helped her a little bit. I have recommended that if this happens again she is to take 2 oxycodone and then repeat x1.  # 4 patient will return in one week's time for followup and cycle 2 of her chemotherapy.   Patient Neutropenia Instruction Sheet  Diagnosis: Breast Cancer      Treating Physician: Drue Second, MD  Treatment: 1. Type of chemotherapy: AC 2. Date of last treatment: 6/23  Last Blood Counts: Lab Results  Component Value Date   WBC 0.8* 07/24/2012   HGB 11.4* 07/24/2012   HCT 32.4* 07/24/2012   MCV 91.3 07/24/2012   PLT 126* 07/24/2012        Prophylactic Antibiotics: Cipro 500 mg by mouth twice a day Instructions: 1. Monitor temperature and call if fever  greater than 100.5, chills, shaking chills (rigors) 2. Call Physician on-call at [phone] 3. Give him/her symptoms and list of medications that you are taking and your last blood count.      All questions were answered. The patient knows to call the clinic with any problems, questions or concerns. We can certainly see the patient much sooner if necessary.  I spent 25 minutes  counseling the patient face to face. The total time spent in the appointment was 30 minutes.    Drue Second, MD Medical/Oncology [facility] [phone] (beeper) [phone] (Office)  07/24/2012, 1:07 PM

## 2012-07-25 ENCOUNTER — Ambulatory Visit: Payer: Managed Care, Other (non HMO)

## 2012-07-28 NOTE — Progress Notes (Signed)
OFFICE PROGRESS NOTE  CC  Jaime Edwards, MD [address] [place] [address] Dr. Everardo Murillo  Dr. 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 Patient had a palpable mass at the 3:00 position. She was seen at the breast Center on 06/20/2012 she had a mammogram and ultrasound showing a 1.7 cm mass at the 3:00 position. 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 Tumor was grade 3.   #2 MRIs performed on 06/26/2012 showed no biopsy mass at 3:00 but also 2 other adjacent masses at 1 at 2:00 measuring 0.8 x 1.6 x 2.2 cm andalso 6 mm ill-defined mass posterior to the biopsy-proven malignancy.   #3 patient has gone on to have a Port-A-Cath placed to begin neoadjuvant chemotherapy initially consisting of Adriamycin Cytoxan to be given dose dense for 4 cycles followed by weekly Taxol and carboplatinum for a total of 12 weeks.carefully agent made to have an excellent response to neoadjuvant chemotherapy and could possibly have breast preservation of course she may even need chest mastectomy with sentinel lymph node biopsies. Patient and I had met at the [facility] clinic we discussed the rationale for upfront chemotherapy she is now here to receive that   CURRENT THERAPY:to begin neoadjuvant Adriamycin and Cytoxan on 07/17/2012  INTERVAL HISTORY: Jaime Murillo 52 y.o. female returns for Followup visit today. Overall she is doing well she did fact looks terrific. We discussed her  pathology report. We also discussed her findings of echocardiogram and MRI report. She denies any fevers chills night sweats headaches shortness of breath chest pains palpitations. She is gong to have a biopsy performed of the lymph node in the right axilla on 07/12/2012.remainder of the tampon review of systems is negative.  MEDICAL HISTORY: Past Medical History  Diagnosis Date  . HSV infection   . NSVD (normal spontaneous vaginal delivery)   . Hypertension     takes HCTZ  . Urinary frequency   . Anemia     takes Ferrous Sulfate occasionally  . Breast cancer 2014    triple negative    ALLERGIES:  has No Known Allergies.  MEDICATIONS:  Current Outpatient Prescriptions  Medication Sig Dispense Refill  . ferrous sulfate 325 (65 FE) MG tablet Take 325 mg by mouth daily with breakfast.      . hydrochlorothiazide (HYDRODIURIL) 25 MG tablet Take 1 tablet (25 mg total) by mouth daily.  30 tablet  11  . valACYclovir (VALTREX) 500 MG tablet Take 500 mg by mouth daily as needed. For herpes      . ciprofloxacin (CIPRO) 500 MG tablet Take 1 tablet (500 mg total) by mouth 2 (two) times daily.  14 tablet  5  . dexamethasone (DECADRON) 4 MG tablet Take 4 mg by mouth 2 (two) times daily with a meal. Take 2 tabs every day on the day after chemo an then take 2 tabs twice a day with food for 2 days.      Marland Kitchen lidocaine-prilocaine (EMLA) cream Apply 1  application topically as needed (apply to [facility] site 1-2 hours prior to treatment.).      Marland Kitchen LORazepam (ATIVAN) 0.5 MG tablet Take 0.5 mg by mouth every 6 (six) hours as needed for anxiety (as needed for nausea and vomitting).      . ondansetron (ZOFRAN) 8 MG tablet Take 8 mg by mouth every 8 (eight) hours as needed for nausea (Take 1 pill by mouth twice a day as needed  for nausea and vomitting starting 3rd day after chemo).      Marland Kitchen oxyCODONE-acetaminophen (ROXICET) 5-325 MG per tablet Take 1-2 tablets by mouth every 4 (four) hours as needed for pain.  30 tablet  0   . prochlorperazine (COMPAZINE) 10 MG tablet Take 10 mg by mouth every 6 (six) hours as needed (for nausea and vomitting).      . prochlorperazine (COMPAZINE) 25 MG suppository Place 25 mg rectally every 12 (twelve) hours as needed for nausea (as needed for nausea and vomitting).       No current facility-administered medications for this visit.    SURGICAL HISTORY:  Past Surgical History  Procedure Laterality Date  . Tubal ligation  2003    LTL  . Portacath placement N/A 07/06/2012    Procedure: INSERTION PORT-A-CATH;  Surgeon: Almond Lint, MD;  Location: [facility] OR;  Service: General;  Laterality: N/A;    REVIEW OF SYSTEMS:  Pertinent items are noted in HPI.   HEALTH MAINTENANCE:   PHYSICAL EXAMINATION: Blood pressure 117/75, pulse 73, temperature 98.3 F (36.8 C), temperature source Oral, resp. rate 20, height 5\' 3"  (1.6 m), weight 113 lb (51.256 kg), last menstrual period 06/19/2012. Body mass index is 20.02 kg/(m^2). ECOG PERFORMANCE STATUS: 0 - Asymptomatic   General appearance: alert, cooperative and appears stated age Resp: clear to auscultation bilaterally Cardio: regular rate and rhythm GI: soft, non-tender; bowel sounds normal; no masses,  no organomegaly Extremities: extremities normal, atraumatic, no cyanosis or edema Neurologic: Grossly normal Breast examination right breast small palpable nodule is noted at the 3:00 position. Left breast no masses or nipple discharge.  LABORATORY DATA: Lab Results  Component Value Date   WBC 0.8* 07/24/2012   HGB 11.4* 07/24/2012   HCT 32.4* 07/24/2012   MCV 91.3 07/24/2012   PLT 126* 07/24/2012      Chemistry      Component Value Date/Time   NA 137 07/24/2012 1215   NA 137 06/06/2012 0853   K 3.5 07/24/2012 1215   K 3.8 06/06/2012 0853   CL 104 07/17/2012 1357   CL 102 06/06/2012 0853   CO2 31* 07/24/2012 1215   CO2 26 06/06/2012 0853   BUN 12.7 07/24/2012 1215   BUN 12 06/06/2012 0853   CREATININE 0.7 07/24/2012 1215   CREATININE  0.70 06/06/2012 0853   CREATININE 0.85 04/24/2009 1114      Component Value Date/Time   CALCIUM 9.2 07/24/2012 1215   CALCIUM 9.3 06/06/2012 0853   ALKPHOS 57 07/24/2012 1215   ALKPHOS 35* 06/06/2012 0853   AST 15 07/24/2012 1215   AST 14 06/06/2012 0853   ALT 18 07/24/2012 1215   ALT 9 06/06/2012 0853   BILITOT 0.85 07/24/2012 1215   BILITOT 0.8 06/06/2012 0853     ADDITIONAL INFORMATION:  PROGNOSTIC INDICATORS - ACIS  Results:  IMMUNOHISTOCHEMICAL AND MORPHOMETRIC ANALYSIS BY THE AUTOMATED CELLULAR IMAGING SYSTEM (ACIS)  Estrogen Receptor: 0%, NEGATIVE  Progesterone Receptor: 0%, NEGATIVE  Proliferation Marker Ki67: 95%  COMMENT: The negative hormone receptor study(ies) in  this case have an internal positive control.  REFERENCE RANGE  ESTROGEN RECEPTOR  NEGATIVE <1%  POSITIVE =>1%  PROGESTERONE RECEPTOR  NEGATIVE <1%  POSITIVE =>1%  All controls stained appropriately  Pecola Leisure MD  Pathologist, Electronic Signature  ( Signed 06/28/2012)  CHROMOGENIC IN-SITU HYBRIDIZATION  Results:  HER-2/NEU BY CISH - NO AMPLIFICATION OF HER-2 DETECTED.  RESULT  RATIO OF HER2: CEP 17 SIGNALS 1.26  1 of 3  Duplicate copy  FINAL for BENNY, HENRIE [id number])  ADDITIONAL INFORMATION:(continued)  AVERAGE HER2 COPY NUMBER PER CELL 2.45  REFERENCE RANGE  NEGATIVE HER2/Chr17 Ratio <2.0 and Average HER2 copy number <4.0  EQUIVOCAL HER2/Chr17 Ratio <2.0 and Average HER2 copy number 4.0 and <6.0  POSITIVE HER2/Chr17 Ratio >=2.0 and/or Average HER2 copy number >=6.0  Jimmy Picket MD  Pathologist, Electronic Signature  ( Signed 06/26/2012)  FINAL DIAGNOSIS  Diagnosis  Breast, right, needle core biopsy, 3:00 position  - INVASIVE DUCTAL CARCINOMA SEE COMMENT.  - DUCTAL CARCINOMA IN SITU  Microscopic Comment  Although the grade of tumor is best assessed at resection, with these biopsies, both the invasive and in situ carcinoma  are grade III. Breast prognostic studies are pending and will be  reported in an addendum. The case is reviewed with  Dr. Raynald Blend who concurs. (CRR:gt, 06/21/12)  [place] RUND DO  Pathologist, Electronic  FINAL DIAGNOSIS  Diagnosis  Breast, right, needle core biopsy, mass  - BENIGN BREAST TISSUE WITH FOCAL PSEUDOANGIOMATOUS STROMAL HYPERPLASIA (PASH).  NO EVIDENCE OF MALIGNANCY.  Microscopic Comment  Called to the [facility] on 07/13/12. (JDP:caf 07/13/12)      RADIOGRAPHIC STUDIES:  Dg Chest 2 View  07/04/2012   *RADIOLOGY REPORT*  Clinical Data: Insertion of Port-A-Cath.  Breast cancer. Hypertension.  CHEST - 2 VIEW  Comparison: CT of the chest on 07/03/2012  Findings: Cardiomediastinal silhouette is within normal limits. The lungs are free of focal consolidations and pleural effusions. Bony structures have a normal appearance.  Contrast is identified within the colon following CT of the abdomen.  IMPRESSION: Negative exam.   Original Report Authenticated By: Norva Pavlov, M.D.   Ct Chest W Contrast  07/03/2012   *RADIOLOGY REPORT*  Clinical Data:  Breast cancer.  No therapy yet.  Lower inner quadrant of right breast.  CT CHEST, ABDOMEN AND PELVIS WITH CONTRAST  Technique: Contiguous axial images of the chest abdomen and pelvis were obtained after IV contrast administration.  Contrast: 80 ml Omnipaque-300  Comparison: Breast MR of 06/26/2012.  No prior CTs.  CT CHEST  Findings: Lung windows demonstrate minimal left lower lobe subpleural nodularity at 3 mm on image 40/series 4.  Right lung clear.  Soft tissue windows demonstrate small bilateral axillary nodes, without adenopathy.  No subpectoral adenopathy. No supraclavicular adenopathy.  Normal heart size, without pericardial effusion.  No central pulmonary embolism, on this non-dedicated study.  No mediastinal or hilar adenopathy.  No internal mammary adenopathy. Minimal residual thymic tissue in the anterior mediastinum (image 17/series 2).  IMPRESSION:  1. No acute process or evidence of  metastatic disease in the chest. 2.  Probable subpleural lymph node in the left lower lobe. Recommend attention on follow-up.  CT ABDOMEN AND PELVIS  Findings:  Mild early phase of hepatic enhancement which decreases sensitivity for focal liver lesions.  None identified.  Normal spleen, stomach, pancreas, gallbladder, biliary tract, adrenal glands, kidneys. No retroperitoneal or retrocrural adenopathy.  Normal colon, appendix, and terminal ileum.  Normal small bowel without abdominal ascites.  No pelvic adenopathy.    Normal urinary bladder and uterus.  No adnexal mass.  Trace free pelvic fluid is likely physiologic.  Transitional right-sided lumbosacral anatomy.  Degenerative disc disease at the lumbosacral junction.  IMPRESSION:  No acute process or evidence of metastatic disease in the abdomen or pelvis.   Original Report Authenticated By: Jeronimo Greaves, M.D.   US Breast Right  06/29/2012   *RADIOLOGY REPORT*  Clinical Data:  52 year old female with newly diagnosed right breast cancer.  Suspicious abnormal enhancement anterior and superior to the biopsy-proven neoplasm - for second look ultrasound evaluation.  RIGHT BREAST ULTRASOUND  Comparison:  Prior mammograms, ultrasounds and MRI.  Findings: Ultrasound is performed, showing the biopsy-proven neoplasm at the 3 o'clock position of the right breast 4 cm from the nipple. No other discrete sonographic abnormalities are identified, specifically medial, superior, and anterior to the biopsy-proven neoplasm.  IMPRESSION: Patchy abnormal enhancement medial superior to the biopsy-proven neoplasm identified on recent MRI is not visualized sonographically as a discrete abnormality.  MR guided biopsy is recommended if breast conservation surgery is considered.  BI-RADS CATEGORY 6:  Known biopsy-proven malignancy - appropriate action should be taken.  RECOMMENDATION: MR guided right breast biopsy, which will be scheduled by our office.  I have discussed the findings and  recommendations with the patient. Results were also provided in writing at the conclusion of the visit.  If applicable, a reminder letter will be sent to the patient regarding the next appointment.   Original Report Authenticated By: Harmon Pier, M.D.   Ct Abdomen Pelvis W Contrast  07/03/2012   *RADIOLOGY REPORT*  Clinical Data:  Breast cancer.  No therapy yet.  Lower inner quadrant of right breast.  CT CHEST, ABDOMEN AND PELVIS WITH CONTRAST  Technique: Contiguous axial images of the chest abdomen and pelvis were obtained after IV contrast administration.  Contrast: 80 ml Omnipaque-300  Comparison: Breast MR of 06/26/2012.  No prior CTs.  CT CHEST  Findings: Lung windows demonstrate minimal left lower lobe subpleural nodularity at 3 mm on image 40/series 4.  Right lung clear.  Soft tissue windows demonstrate small bilateral axillary nodes, without adenopathy.  No subpectoral adenopathy. No supraclavicular adenopathy.  Normal heart size, without pericardial effusion.  No central pulmonary embolism, on this non-dedicated study.  No mediastinal or hilar adenopathy.  No internal mammary adenopathy. Minimal residual thymic tissue in the anterior mediastinum (image 17/series 2).  IMPRESSION:  1. No acute process or evidence of metastatic disease in the chest. 2.  Probable subpleural lymph node in the left lower lobe. Recommend attention on follow-up.  CT ABDOMEN AND PELVIS  Findings:  Mild early phase of hepatic enhancement which decreases sensitivity for focal liver lesions.  None identified.  Normal spleen, stomach, pancreas, gallbladder, biliary tract, adrenal glands, kidneys. No retroperitoneal or retrocrural adenopathy.  Normal colon, appendix, and terminal ileum.  Normal small bowel without abdominal ascites.  No pelvic adenopathy.    Normal urinary bladder and uterus.  No adnexal mass.  Trace free pelvic fluid is likely physiologic.  Transitional right-sided lumbosacral anatomy.  Degenerative disc disease at the  lumbosacral junction.  IMPRESSION:  No acute process or evidence of metastatic disease in the abdomen or pelvis.   Original Report Authenticated By: Jeronimo Greaves, M.D.   Mr Breast Right W Wo Contrast  07/12/2012   *RADIOLOGY REPORT*  Clinical Data:  Known invasive mammary carcinoma in the right breast at 3 o'clock posteriorly.  Additional site of abnormal enhancement in the  right upper inner quadrant closer to the nipple for possible biopsy.  ATTEMPTED MRI GUIDED VACUUM ASSISTED BIOPSY OF THE RIGHT BREAST WITHOUT AND WITH CONTRAST  Comparison: Previous exams.  Technique: Multiplanar, multisequence MR images of the right breast were obtained prior to and following the intravenous administration of 9 ml of Mulithance.  I met with the patient, and we discussed the procedure of MRI guided biopsy, including risks, benefits, and alternatives. Specifically, we discussed the risks of infection, bleeding, tissue injury, clip migration, and inadequate sampling.  Informed, written consent was given.  Using sterile technique, 2% Lidocaine, MRI guidance, and a 9 gauge vacuum assisted device, biopsy was attempted using a mediolateral approach.  However, the procedure was unsuccessful due to the extremely thin breast compression.  IMPRESSION:  Unsuccessful MRI guided vacuum-assisted biopsy of the right breast.  Patient was sent to the Breast Center for further evaluation with ultrasound.   Original Report Authenticated By: Cain Saupe, M.D.   Dg Chest Port 1 View  07/06/2012   *RADIOLOGY REPORT*  Clinical Data: Post left-sided port catheter insertion  PORTABLE CHEST - 1 VIEW  Comparison: 07/04/2012  Findings:  Grossly unchanged cardiac silhouette and mediastinal contours. Interval placement of a left anterior chest wall subclavian vein approach port-a-catheter with tip projecting over the superior cavoatrial junction. Grossly unchanged symmetric biapical pleural parenchymal thickening.  No pneumothorax.  No focal airspace  opacities.  No pleural effusion.  No evidence of edema.  Unchanged bones.  IMPRESSION: Interval placement of a left anterior chest wall subclavian vein approach port-a-catheter with tip overlying the superior cavoatrial junction.  No pneumothorax.   Original Report Authenticated By: Tacey Ruiz, MD   Mm Digital Diagnostic Unilat R  07/12/2012   *RADIOLOGY REPORT*  Clinical Data:  Ultrasound-guided core needle biopsy of a hypoechoic area at 2 o'clock 2 cm from the right nipple with clip placement.  Known invasive mammary carcinoma in the right breast. Unsuccessful right breast MRI guided biopsy earlier today.  DIGITAL DIAGNOSTIC RIGHT MAMMOGRAM  Comparison:  Previous exams.  Findings:  Films are performed following ultrasound guided biopsy of a small hypoechoic area at 2 o'clock 2 cm from the right nipple. The ribbon clip is positioned within the area of concern. The ribbon clip is approximately 2.6 cm anterior to the top hat clip from the original biopsy on 06/20/2012.  IMPRESSION: Appropriate clip placement following ultrasound-guided core needle biopsy of a hypoechoic area at 2 o'clock 2 cm in the right nipple.   Original Report Authenticated By: Cain Saupe, M.D.   Dg Fluoro Guide Cv Line-no Report  07/06/2012   CLINICAL DATA: port placement   FLOURO GUIDE CV LINE  Fluoroscopy was utilized by the requesting physician.  No radiographic  interpretation.    [place] Rt Breast Bx W Loc Dev 1st Lesion Img Bx Spec US Guide  07/13/2012   **ADDENDUM** CREATED: 07/13/2012 14:11:45  I spoke with the patient by telephone on 07/13/2012 to discuss pathology results.  Pathology demonstrates  pseudoangiomatous stromal hyperplasia.  The findings were called to the office of Dr. Donell Beers. Although the imaging findings could be related to pseudoangiomatous stromal hyperplasia, excision of the anterior biopsy site is suggested at the time of patient's definitive surgery.  The patient is considering unilateral or bilateral  mastectomy at this time.  The patient reports no problems at the biopsy site.  All questions were answered.  Recommendations:  Excision of the biopsy site. The patient is scheduled to begin chemotherapy in 4 days.  **  END ADDENDUM** SIGNED BY: Blair Hailey. Manson Passey, M.D.  07/12/2012   *RADIOLOGY REPORT*  Clinical Data:  Unsuccessful MRI guided biopsy of the area of abnormal enhancement in the left anterior upper inner quadrant, anterior to the known invasive mammary carcinoma.  Second look ultrasound performed and a 5 x 4 x 4 mm hypoechoic area found at 2 o'clock 2 cm from the right nipple which will be biopsied.  ULTRASOUND GUIDED VACUUM ASSISTED CORE BIOPSY OF THE RIGHT BREAST  Sonography of the right upper inner quadrant was performed.  There is an ill-defined hypoechoic 5 x 4 x 4 mm area at 2 o'clock 2 cm from the right nipple which will be biopsied with ultrasound guidance.  The patient and I discussed the procedure of ultrasound-guided biopsy, including benefits and alternatives.  We discussed the high likelihood of a successful procedure. We discussed the risks of the procedure including infection, bleeding, tissue injury, clip migration, and inadequate sampling.  Written informed consent was given.  Using sterile technique, 2% lidocaine, ultrasound guidance, and a 12 gauge vacuum assisted needle, biopsy was performed of the hypoechoic area at 2 o'clock 2 cm from the right nipple using a caudocranial approach.  At the conclusion of the procedure, a within tissue marker clip was deployed into the biopsy cavity. Follow-up 2-view mammogram was performed and dictated separately.  IMPRESSION: Ultrasound-guided biopsy of a 5 x 4 x 4 mm hypoechoic area at 2 o'clock 2 cm from the right nipple.  No apparent complications.   Original Report Authenticated By: Cain Saupe, M.D.    ASSESSMENT: 52 year old female with #1new diagnosis of triple negative (T1 and ask) invasive ductal carcinoma with an elevated Ki-67 of  95%. Patient had other areas seen on MRI biopsy of the second mass was negative. Patient has had her Port-A-Cath placed. She is proceeding neoadjuvant chemotherapy to possibly conserve her breast. Chemotherapy will consist of Adriamycin Cytoxan given dose dense for 4 cycles starting today 07/17/2012. She will receive Neulasta support on day 2 for each cycle. When she completes this then she will begin weekly Taxol and carboplatinum for a total of 12 weeks.   #2 we discussed risks benefits and side effects of chemotherapy. She has had an echocardiogram as well as chemotherapy teaching class. Port-A-Cath was placed by Dr. Donell Beers. The port is functioning well.   #3 I educated and counseled her regarding antiemetic use. She does have all of her medications available to her. She understands how to take them.  PLAN:   #1 patient will like to proceed with her chemotherapy on 07/17/2012 do to her upcoming birthday..  #2 she has all of her antiemetics as well as her EMLA cream.  #3 I will see her back on June 23 for cycle 1 day 1 of her chemotherapy.   All questions were answered. The patient knows to call the clinic with any problems, questions or concerns. We can certainly see the patient much sooner if necessary.  I spent 25 minutes counseling the patient face to face. The total time spent in the appointment was 30 minutes.    Drue Second, MD Medical/Oncology [facility] [phone] (beeper) [phone] (Office)

## 2012-07-31 ENCOUNTER — Other Ambulatory Visit (HOSPITAL_BASED_OUTPATIENT_CLINIC_OR_DEPARTMENT_OTHER): Payer: Medicaid Other | Admitting: Lab

## 2012-07-31 ENCOUNTER — Ambulatory Visit (HOSPITAL_BASED_OUTPATIENT_CLINIC_OR_DEPARTMENT_OTHER): Payer: Self-pay

## 2012-07-31 ENCOUNTER — Encounter: Payer: Self-pay | Admitting: Adult Health

## 2012-07-31 ENCOUNTER — Ambulatory Visit (HOSPITAL_BASED_OUTPATIENT_CLINIC_OR_DEPARTMENT_OTHER): Payer: Medicaid Other | Admitting: Adult Health

## 2012-07-31 VITALS — BP 114/79 | HR 76 | Temp 98.2°F | Resp 20 | Ht 63.0 in | Wt 116.6 lb

## 2012-07-31 DIAGNOSIS — Z171 Estrogen receptor negative status [ER-]: Secondary | ICD-10-CM

## 2012-07-31 DIAGNOSIS — C50919 Malignant neoplasm of unspecified site of unspecified female breast: Secondary | ICD-10-CM

## 2012-07-31 DIAGNOSIS — C50311 Malignant neoplasm of lower-inner quadrant of right female breast: Secondary | ICD-10-CM

## 2012-07-31 DIAGNOSIS — Z5111 Encounter for antineoplastic chemotherapy: Secondary | ICD-10-CM

## 2012-07-31 LAB — CBC WITH DIFFERENTIAL/PLATELET
EOS%: 0 % (ref 0.0–7.0)
Eosinophils Absolute: 0 10*3/uL (ref 0.0–0.5)
MCH: 31.5 pg (ref 25.1–34.0)
MCV: 91.6 fL (ref 79.5–101.0)
MONO%: 14.8 % — ABNORMAL HIGH (ref 0.0–14.0)
NEUT#: 3.3 10*3/uL (ref 1.5–6.5)
RBC: 3.33 10*6/uL — ABNORMAL LOW (ref 3.70–5.45)
RDW: 12.1 % (ref 11.2–14.5)
lymph#: 1.9 10*3/uL (ref 0.9–3.3)
nRBC: 0 % (ref 0–0)

## 2012-07-31 MED ORDER — PALONOSETRON HCL INJECTION 0.25 MG/5ML
0.2500 mg | Freq: Once | INTRAVENOUS | Status: AC
  Administered 2012-07-31: 0.25 mg via INTRAVENOUS

## 2012-07-31 MED ORDER — DOXORUBICIN HCL CHEMO IV INJECTION 2 MG/ML
60.0000 mg/m2 | Freq: Once | INTRAVENOUS | Status: AC
  Administered 2012-07-31: 92 mg via INTRAVENOUS
  Filled 2012-07-31: qty 46

## 2012-07-31 MED ORDER — HEPARIN SOD (PORK) LOCK FLUSH 100 UNIT/ML IV SOLN
500.0000 [IU] | Freq: Once | INTRAVENOUS | Status: AC | PRN
  Administered 2012-07-31: 500 [IU]
  Filled 2012-07-31: qty 5

## 2012-07-31 MED ORDER — SODIUM CHLORIDE 0.9 % IV SOLN
Freq: Once | INTRAVENOUS | Status: AC
  Administered 2012-07-31: 17:00:00 via INTRAVENOUS

## 2012-07-31 MED ORDER — SODIUM CHLORIDE 0.9 % IJ SOLN
10.0000 mL | INTRAMUSCULAR | Status: DC | PRN
  Administered 2012-07-31: 10 mL
  Filled 2012-07-31: qty 10

## 2012-07-31 MED ORDER — SODIUM CHLORIDE 0.9 % IV SOLN
150.0000 mg | Freq: Once | INTRAVENOUS | Status: AC
  Administered 2012-07-31: 150 mg via INTRAVENOUS
  Filled 2012-07-31: qty 5

## 2012-07-31 MED ORDER — SODIUM CHLORIDE 0.9 % IV SOLN
600.0000 mg/m2 | Freq: Once | INTRAVENOUS | Status: AC
  Administered 2012-07-31: 920 mg via INTRAVENOUS
  Filled 2012-07-31: qty 46

## 2012-07-31 MED ORDER — DEXAMETHASONE SODIUM PHOSPHATE 20 MG/5ML IJ SOLN
12.0000 mg | Freq: Once | INTRAMUSCULAR | Status: AC
  Administered 2012-07-31: 12 mg via INTRAVENOUS

## 2012-07-31 NOTE — Progress Notes (Signed)
OFFICE PROGRESS NOTE  CC  [name], MD [address] [place] [address] Dr. Everardo Beals Dr. 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 Patient had a palpable mass at the 3:00 position. She was seen at the breast Center on 06/20/2012 she had a mammogram and ultrasound showing a 1.7 cm mass at the 3:00 position. 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 Tumor was grade 3.  #2 MRIs performed on 06/26/2012 showed no biopsy mass at 3:00 but also 2 other adjacent masses at 1 at 2:00 measuring 0.8 x 1.6 x 2.2 cm andalso 6 mm ill-defined mass posterior to the biopsy-proven malignancy.  #3 patient has gone on to have a Port-A-Cath placed to begin neoadjuvant chemotherapy initially consisting of Adriamycin Cytoxan to be given dose dense for 4 cycles followed by weekly Taxol and carboplatinum for a total of 12 weeks.carefully agent made to have an excellent response to neoadjuvant chemotherapy and could possibly have breast preservation of course she may even need chest mastectomy with sentinel lymph node biopsies. Patient and I had met at the [facility] clinic we discussed the rationale for upfront chemotherapy she is now here to receive that  CURRENT THERAPY: cycle 2 day 1 of Adriamycin and Cytoxan  INTERVAL HISTORY: Jaime Murillo 52 y.o. female returns for evaluation prior to her second cycle of Adriamycin/Cytoxan.  She is doing well.  She would like another copy of the anti-emetic instructions given to her last week by the RN.  Otherwise, her counts have recovered nicely and she denies fevers, chills, nausea, vomiting, constipation, diarrhea, numbness, or any other  concerns.  A 10 point ROS is otherwise negative.    MEDICAL HISTORY: Past Medical History  Diagnosis Date  . HSV infection   . NSVD (normal spontaneous vaginal delivery)   . Hypertension     takes HCTZ  . Urinary frequency   . Anemia     takes Ferrous Sulfate occasionally  . Breast cancer 2014    triple negative    ALLERGIES:  has No Known Allergies.  MEDICATIONS:  Current Outpatient Prescriptions  Medication Sig Dispense Refill  . ciprofloxacin (CIPRO) 500 MG tablet Take 1 tablet (500 mg total) by mouth 2 (two) times daily.  14 tablet  5  . dexamethasone (DECADRON) 4 MG tablet Take 4 mg by mouth 2 (two) times daily with a meal. Take 2 tabs every day on the day after chemo an then take 2 tabs twice a day with food for 2 days.      . ferrous sulfate 325 (65 FE) MG tablet Take 325 mg by mouth daily with breakfast.      . hydrochlorothiazide (HYDRODIURIL) 25 MG tablet Take 1 tablet (25 mg total) by mouth daily.  30 tablet  11  . lidocaine-prilocaine (EMLA) cream Apply 1 application topically as needed (apply to PAC site 1-2 hours prior to treatment.).      Marland Kitchen LORazepam (ATIVAN) 0.5 MG tablet Take 0.5 mg by mouth every 6 (six) hours as needed for anxiety (as needed for nausea and vomitting).      . ondansetron (ZOFRAN) 8 MG tablet Take 8 mg by mouth every 8 (eight) hours as needed for nausea (Take 1  pill by mouth twice a day as needed  for nausea and vomitting starting 3rd day after chemo).      Marland Kitchen oxyCODONE-acetaminophen (ROXICET) 5-325 MG per tablet Take 1-2 tablets by mouth every 4 (four) hours as needed for pain.  30 tablet  0  . prochlorperazine (COMPAZINE) 10 MG tablet Take 10 mg by mouth every 6 (six) hours as needed (for nausea and vomitting).      . prochlorperazine (COMPAZINE) 25 MG suppository Place 25 mg rectally every 12 (twelve) hours as needed for nausea (as needed for nausea and vomitting).      . valACYclovir (VALTREX) 500 MG tablet Take 500 mg by mouth daily as needed. For  herpes       No current facility-administered medications for this visit.    SURGICAL HISTORY:  Past Surgical History  Procedure Laterality Date  . Tubal ligation  2003    LTL  . Portacath placement N/A 07/06/2012    Procedure: INSERTION PORT-A-CATH;  Surgeon: Almond Lint, MD;  Location: [facility] OR;  Service: General;  Laterality: N/A;    REVIEW OF SYSTEMS:  General: fatigue (-), night sweats (-), fever (-), pain (-) Lymph: palpable nodes (-) HEENT: vision changes (-), mucositis (-), gum bleeding (-), epistaxis (-) Cardiovascular: chest pain (-), palpitations (-) Pulmonary: shortness of breath (-), dyspnea on exertion (-), cough (-), hemoptysis (-) GI:  Early satiety (-), melena (-), dysphagia (-), nausea/vomiting (-), diarrhea (-) GU: dysuria (-), hematuria (-), incontinence (-) Musculoskeletal: joint swelling (-), joint pain (-), back pain (-) Neuro: weakness (-), numbness (-), headache (-), confusion (-) Skin: Rash (-), lesions (-), dryness (-) Psych: depression (-), suicidal/homicidal ideation (-), feeling of hopelessness (-)   HEALTH MAINTENANCE:   PHYSICAL EXAMINATION: Blood pressure 114/79, pulse 76, temperature 98.2 F (36.8 C), temperature source Oral, resp. rate 20, height 5\' 3"  (1.6 m), weight 116 lb 9.6 oz (52.889 kg), last menstrual period 06/19/2012. Body mass index is 20.66 kg/(m^2). General: Patient is a well appearing female in no acute distress HEENT: PERRLA, sclerae anicteric no conjunctival pallor, MMM Neck: supple, no palpable adenopathy Lungs: clear to auscultation bilaterally, no wheezes, rhonchi, or rales Cardiovascular: regular rate rhythm, S1, S2, no murmurs, rubs or gallops Abdomen: Soft, non-tender, non-distended, normoactive bowel sounds, no HSM Extremities: warm and well perfused, no clubbing, cyanosis, or edema Skin: No rashes or lesions Neuro: Non-focal Breast examination: Left breast no masses or nipple discharge Right breast reveals 3 discrete  masses at the 3:00 position 2:00 position.  It is nontender there is no nipple discharge. Conglomeration of these masses to my exam measure about 2-3cm. ECOG PERFORMANCE STATUS: 0 - Asymptomatic      LABORATORY DATA: Lab Results  Component Value Date   WBC 6.2 07/31/2012   HGB 10.5* 07/31/2012   HCT 30.5* 07/31/2012   MCV 91.6 07/31/2012   PLT 277 07/31/2012      Chemistry      Component Value Date/Time   NA 137 07/24/2012 1215   NA 137 06/06/2012 0853   K 3.5 07/24/2012 1215   K 3.8 06/06/2012 0853   CL 104 07/17/2012 1357   CL 102 06/06/2012 0853   CO2 31* 07/24/2012 1215   CO2 26 06/06/2012 0853   BUN 12.7 07/24/2012 1215   BUN 12 06/06/2012 0853   CREATININE 0.7 07/24/2012 1215   CREATININE 0.70 06/06/2012 0853   CREATININE 0.85 04/24/2009 1114      Component Value Date/Time   CALCIUM 9.2 07/24/2012 1215  CALCIUM 9.3 06/06/2012 0853   ALKPHOS 57 07/24/2012 1215   ALKPHOS 35* 06/06/2012 0853   AST 15 07/24/2012 1215   AST 14 06/06/2012 0853   ALT 18 07/24/2012 1215   ALT 9 06/06/2012 0853   BILITOT 0.85 07/24/2012 1215   BILITOT 0.8 06/06/2012 0853     ADDITIONAL INFORMATION: PROGNOSTIC INDICATORS - ACIS Results: IMMUNOHISTOCHEMICAL AND MORPHOMETRIC ANALYSIS BY THE AUTOMATED CELLULAR IMAGING SYSTEM (ACIS) Estrogen Receptor: 0%, NEGATIVE Progesterone Receptor: 0%, NEGATIVE Proliferation Marker Ki67: 95% COMMENT: The negative hormone receptor study(ies) in this case have an internal positive control. REFERENCE RANGE ESTROGEN RECEPTOR NEGATIVE <1% POSITIVE =>1% PROGESTERONE RECEPTOR NEGATIVE <1% POSITIVE =>1% All controls stained appropriately Pecola Leisure MD Pathologist, Electronic Signature ( Signed 06/28/2012) CHROMOGENIC IN-SITU HYBRIDIZATION Results: HER-2/NEU BY CISH - NO AMPLIFICATION OF HER-2 DETECTED. RESULT RATIO OF HER2: CEP 17 SIGNALS 1.26 1 of 3 Duplicate copy FINAL for RHIANNON, SASSAMAN [phone]) ADDITIONAL INFORMATION:(continued) AVERAGE HER2 COPY NUMBER PER  CELL 2.45 REFERENCE RANGE NEGATIVE HER2/Chr17 Ratio <2.0 and Average HER2 copy number <4.0 EQUIVOCAL HER2/Chr17 Ratio <2.0 and Average HER2 copy number 4.0 and <6.0 POSITIVE HER2/Chr17 Ratio >=2.0 and/or Average HER2 copy number >=6.0 Jimmy Picket MD Pathologist, Electronic Signature ( Signed 06/26/2012) FINAL DIAGNOSIS Diagnosis Breast, right, needle core biopsy, 3:00 position - INVASIVE DUCTAL CARCINOMA SEE COMMENT. - DUCTAL CARCINOMA IN SITU Microscopic Comment Although the grade of tumor is best assessed at resection, with these biopsies, both the invasive and in situ carcinoma are grade III. Breast prognostic studies are pending and will be reported in an addendum. The case is reviewed with Dr. Raynald Blend who concurs. (CRR:gt, 06/21/12) [place] RUND DO Pathologist, Electronic FINAL DIAGNOSIS Diagnosis Breast, right, needle core biopsy, mass - BENIGN BREAST TISSUE WITH FOCAL PSEUDOANGIOMATOUS STROMAL HYPERPLASIA (PASH). NO EVIDENCE OF MALIGNANCY. Microscopic Comment Called to the [facility] on 07/13/12. (JDP:caf 07/13/12)  RADIOGRAPHIC STUDIES:  Dg Chest 2 View  07/04/2012   *RADIOLOGY REPORT*  Clinical Data: Insertion of Port-A-Cath.  Breast cancer. Hypertension.  CHEST - 2 VIEW  Comparison: CT of the chest on 07/03/2012  Findings: Cardiomediastinal silhouette is within normal limits. The lungs are free of focal consolidations and pleural effusions. Bony structures have a normal appearance.  Contrast is identified within the colon following CT of the abdomen.  IMPRESSION: Negative exam.   Original Report Authenticated By: Norva Pavlov, M.D.   Ct Chest W Contrast  07/03/2012   *RADIOLOGY REPORT*  Clinical Data:  Breast cancer.  No therapy yet.  Lower inner quadrant of right breast.  CT CHEST, ABDOMEN AND PELVIS WITH CONTRAST  Technique: Contiguous axial images of the chest abdomen and pelvis were obtained after IV contrast administration.  Contrast: 80 ml Omnipaque-300   Comparison: Breast MR of 06/26/2012.  No prior CTs.  CT CHEST  Findings: Lung windows demonstrate minimal left lower lobe subpleural nodularity at 3 mm on image 40/series 4.  Right lung clear.  Soft tissue windows demonstrate small bilateral axillary nodes, without adenopathy.  No subpectoral adenopathy. No supraclavicular adenopathy.  Normal heart size, without pericardial effusion.  No central pulmonary embolism, on this non-dedicated study.  No mediastinal or hilar adenopathy.  No internal mammary adenopathy. Minimal residual thymic tissue in the anterior mediastinum (image 17/series 2).  IMPRESSION:  1. No acute process or evidence of metastatic disease in the chest. 2.  Probable subpleural lymph node in the left lower lobe. Recommend attention on follow-up.  CT ABDOMEN AND PELVIS  Findings:  Mild early phase  of hepatic enhancement which decreases sensitivity for focal liver lesions.  None identified.  Normal spleen, stomach, pancreas, gallbladder, biliary tract, adrenal glands, kidneys. No retroperitoneal or retrocrural adenopathy.  Normal colon, appendix, and terminal ileum.  Normal small bowel without abdominal ascites.  No pelvic adenopathy.    Normal urinary bladder and uterus.  No adnexal mass.  Trace free pelvic fluid is likely physiologic.  Transitional right-sided lumbosacral anatomy.  Degenerative disc disease at the lumbosacral junction.  IMPRESSION:  No acute process or evidence of metastatic disease in the abdomen or pelvis.   Original Report Authenticated By: Jeronimo Greaves, M.D.   US Breast Right  06/29/2012   *RADIOLOGY REPORT*  Clinical Data:  52 year old female with newly diagnosed right breast cancer.  Suspicious abnormal enhancement anterior and superior to the biopsy-proven neoplasm - for second look ultrasound evaluation.  RIGHT BREAST ULTRASOUND  Comparison:  Prior mammograms, ultrasounds and MRI.  Findings: Ultrasound is performed, showing the biopsy-proven neoplasm at the 3 o'clock  position of the right breast 4 cm from the nipple. No other discrete sonographic abnormalities are identified, specifically medial, superior, and anterior to the biopsy-proven neoplasm.  IMPRESSION: Patchy abnormal enhancement medial superior to the biopsy-proven neoplasm identified on recent MRI is not visualized sonographically as a discrete abnormality.  MR guided biopsy is recommended if breast conservation surgery is considered.  BI-RADS CATEGORY 6:  Known biopsy-proven malignancy - appropriate action should be taken.  RECOMMENDATION: MR guided right breast biopsy, which will be scheduled by our office.  I have discussed the findings and recommendations with the patient. Results were also provided in writing at the conclusion of the visit.  If applicable, a reminder letter will be sent to the patient regarding the next appointment.   Original Report Authenticated By: Harmon Pier, M.D.   US Breast Right  06/20/2012   *RADIOLOGY REPORT*  Clinical Data:  52 year old female for annual bilateral mammograms and palpable mass in the upper and inner right breast discovered on clinical examination.  DIGITAL DIAGNOSTIC BILATERAL MAMMOGRAM WITH CAD AND RIGHT BREAST ULTRASOUND:  Comparison:  03/21/2009 and prior mammograms dating back to 01/21/2005  Findings:  ACR Breast Density Category 4: The breast tissue is extremely dense.  Routine and spot compression views of the right breast demonstrate no evidence of suspicious mass or distortion. Scattered punctate calcifications within the breasts bilaterally are again identified.  Mammographic images were processed with CAD.  On physical exam, thickening is identified at the 12 o'clock position of the right breast 4 cm from the nipple. A firm palpable mass identified at the 3 o'clock position of the right breast 4 cm from the nipple.  Ultrasound is performed, showing no evidence of solid or cystic mass, distortion or abnormal areas of shadowing at the 12 o'clock position of  the right breast. A 7 x 6 x 17 mm slightly irregular oval hypoechoic mass with calcifications is horizontally oriented at the 3 o'clock position of the right breast 4 cm from the nipple.  IMPRESSION: Indeterminate 7 x 6 x 17 mm hypoechoic mass at the 3 o'clock position of the right breast.  Tissue sampling is recommended.  No mammographic evidence of breast malignancy bilaterally.  No mammographic, palpable or sonographic abnormality at the 12 o'clock position of the right breast, in the area of palpable concern.  BI-RADS CATEGORY 4:  Suspicious abnormality - biopsy should be considered.  RECOMMENDATION:  Ultrasound guided right breast biopsy, which will be performed today but dictated in a separate report.  I have discussed the findings and recommendations with the patient. Results were also provided in writing at the conclusion of the visit.  If applicable, a reminder letter will be sent to the patient regarding the next appointment.   Original Report Authenticated By: Harmon Pier, M.D.   Ct Abdomen Pelvis W Contrast  07/03/2012   *RADIOLOGY REPORT*  Clinical Data:  Breast cancer.  No therapy yet.  Lower inner quadrant of right breast.  CT CHEST, ABDOMEN AND PELVIS WITH CONTRAST  Technique: Contiguous axial images of the chest abdomen and pelvis were obtained after IV contrast administration.  Contrast: 80 ml Omnipaque-300  Comparison: Breast MR of 06/26/2012.  No prior CTs.  CT CHEST  Findings: Lung windows demonstrate minimal left lower lobe subpleural nodularity at 3 mm on image 40/series 4.  Right lung clear.  Soft tissue windows demonstrate small bilateral axillary nodes, without adenopathy.  No subpectoral adenopathy. No supraclavicular adenopathy.  Normal heart size, without pericardial effusion.  No central pulmonary embolism, on this non-dedicated study.  No mediastinal or hilar adenopathy.  No internal mammary adenopathy. Minimal residual thymic tissue in the anterior mediastinum (image 17/series 2).   IMPRESSION:  1. No acute process or evidence of metastatic disease in the chest. 2.  Probable subpleural lymph node in the left lower lobe. Recommend attention on follow-up.  CT ABDOMEN AND PELVIS  Findings:  Mild early phase of hepatic enhancement which decreases sensitivity for focal liver lesions.  None identified.  Normal spleen, stomach, pancreas, gallbladder, biliary tract, adrenal glands, kidneys. No retroperitoneal or retrocrural adenopathy.  Normal colon, appendix, and terminal ileum.  Normal small bowel without abdominal ascites.  No pelvic adenopathy.    Normal urinary bladder and uterus.  No adnexal mass.  Trace free pelvic fluid is likely physiologic.  Transitional right-sided lumbosacral anatomy.  Degenerative disc disease at the lumbosacral junction.  IMPRESSION:  No acute process or evidence of metastatic disease in the abdomen or pelvis.   Original Report Authenticated By: Jeronimo Greaves, M.D.   Mr Breast Right W Wo Contrast  07/12/2012   *RADIOLOGY REPORT*  Clinical Data:  Known invasive mammary carcinoma in the right breast at 3 o'clock posteriorly.  Additional site of abnormal enhancement in the right upper inner quadrant closer to the nipple for possible biopsy.  ATTEMPTED MRI GUIDED VACUUM ASSISTED BIOPSY OF THE RIGHT BREAST WITHOUT AND WITH CONTRAST  Comparison: Previous exams.  Technique: Multiplanar, multisequence MR images of the right breast were obtained prior to and following the intravenous administration of 9 ml of Mulithance.  I met with the patient, and we discussed the procedure of MRI guided biopsy, including risks, benefits, and alternatives. Specifically, we discussed the risks of infection, bleeding, tissue injury, clip migration, and inadequate sampling.  Informed, written consent was given.  Using sterile technique, 2% Lidocaine, MRI guidance, and a 9 gauge vacuum assisted device, biopsy was attempted using a mediolateral approach.  However, the procedure was unsuccessful due  to the extremely thin breast compression.  IMPRESSION:  Unsuccessful MRI guided vacuum-assisted biopsy of the right breast.  Patient was sent to the [facility] for further evaluation with ultrasound.   Original Report Authenticated By: Cain Saupe, M.D.   Mr Breast Bilateral W Wo Contrast  06/26/2012   *RADIOLOGY REPORT*  Clinical Data: History of new diagnosis of breast cancer from recent ultrasound guided core needle biopsy right breast 06/20/2012 demonstrating invasive ductal carcinoma with DCIS.  GFR 89, creatinine 0.7 on 06/06/2012.  BILATERAL BREAST MRI WITH  AND WITHOUT CONTRAST  Technique: Multiplanar, multisequence MR images of both breasts were obtained prior to and following the intravenous administration of 10ml of multihance.  Three dimensional images were evaluated at the independent DynaCad workstation.  Comparison:  Recent mammograms and ultrasound.  Findings: Examination demonstrates mild background parenchymal enhancement.  Right breast:  There is evidence of an ovoid gently lobulated and somewhat ill-defined heterogeneously enhancing mass over the deep third of the inner midportion of the right breast representing patient's biopsy-proven malignancy.  This mass contains central clip artifact and measures approximately 0.9 x 1.9 x 1.0 cm. The anterior border of this mass is located 5.5 cm from the nipple. There is a 6 mm slightly ill-defined enhancing ovoid mass 6 mm posterior lateral to the biopsy-proven malignancy lying just superficial to the pectoralis muscle.  There is also clumped non mass enhancement anterior and slightly superior to the biopsy- proven malignancy in the middle third at approximately the 2 o'clock position.  This measures approximately 0.8 x 1.6 x 1.2 cm and is located 3 cm from the nipple.  This is suspicious for multifocal disease.  No suspicious axillary or internal mammary lymph nodes.  Left breast:  No suspicious mass, enhancement or adenopathy.  IMPRESSION: Known  biopsy-proven malignancy over the deep third of the inner mid right breast.  6 mm ill-defined enhancing ovoid mass 6 mm posterior lateral to the biopsy-proven malignancy and lying just superficial to the pectoralis muscle.  Clumped non mass enhancement anterior and superior to patient's known malignancy at approximately the 2 o'clock position measuring 0.8 x 1.6 x 2.2 cm.  Findings likely represent multi focal disease.  RECOMMENDATION: Recommend second look ultrasound and core biopsy if feasible of the clumped non mass enhancement at approximately the 2 o'clock position right breast anterior/superior to the known malignancy. If not seen by ultrasound, recommend proceeding to MR guided biopsy of this suspicious abnormality.  THREE-DIMENSIONAL MR IMAGE RENDERING ON INDEPENDENT WORKSTATION:  Three-dimensional MR images were rendered by post-processing of the original MR data on an independent workstation.  The three- dimensional MR images were interpreted, and findings were reported in the accompanying complete MRI report for this study.  BI-RADS CATEGORY 0:  Incomplete.  Need additional imaging evaluation and/or prior mammograms for comparison.   Original Report Authenticated By: Elberta Fortis, M.D.   Dg Chest Port 1 View  07/06/2012   *RADIOLOGY REPORT*  Clinical Data: Post left-sided port catheter insertion  PORTABLE CHEST - 1 VIEW  Comparison: 07/04/2012  Findings:  Grossly unchanged cardiac silhouette and mediastinal contours. Interval placement of a left anterior chest wall subclavian vein approach port-a-catheter with tip projecting over the superior cavoatrial junction. Grossly unchanged symmetric biapical pleural parenchymal thickening.  No pneumothorax.  No focal airspace opacities.  No pleural effusion.  No evidence of edema.  Unchanged bones.  IMPRESSION: Interval placement of a left anterior chest wall subclavian vein approach port-a-catheter with tip overlying the superior cavoatrial junction.  No  pneumothorax.   Original Report Authenticated By: Tacey Ruiz, MD   Mm Digital Diagnostic Bilat  06/20/2012   *RADIOLOGY REPORT*  Clinical Data:  52 year old female for annual bilateral mammograms and palpable mass in the upper and inner right breast discovered on clinical examination.  DIGITAL DIAGNOSTIC BILATERAL MAMMOGRAM WITH CAD AND RIGHT BREAST ULTRASOUND:  Comparison:  03/21/2009 and prior mammograms dating back to 01/21/2005  Findings:  ACR Breast Density Category 4: The breast tissue is extremely dense.  Routine and spot compression views of the right  breast demonstrate no evidence of suspicious mass or distortion. Scattered punctate calcifications within the breasts bilaterally are again identified.  Mammographic images were processed with CAD.  On physical exam, thickening is identified at the 12 o'clock position of the right breast 4 cm from the nipple. A firm palpable mass identified at the 3 o'clock position of the right breast 4 cm from the nipple.  Ultrasound is performed, showing no evidence of solid or cystic mass, distortion or abnormal areas of shadowing at the 12 o'clock position of the right breast. A 7 x 6 x 17 mm slightly irregular oval hypoechoic mass with calcifications is horizontally oriented at the 3 o'clock position of the right breast 4 cm from the nipple.  IMPRESSION: Indeterminate 7 x 6 x 17 mm hypoechoic mass at the 3 o'clock position of the right breast.  Tissue sampling is recommended.  No mammographic evidence of breast malignancy bilaterally.  No mammographic, palpable or sonographic abnormality at the 12 o'clock position of the right breast, in the area of palpable concern.  BI-RADS CATEGORY 4:  Suspicious abnormality - biopsy should be considered.  RECOMMENDATION:  Ultrasound guided right breast biopsy, which will be performed today but dictated in a separate report.  I have discussed the findings and recommendations with the patient. Results were also provided in writing  at the conclusion of the visit.  If applicable, a reminder letter will be sent to the patient regarding the next appointment.   Original Report Authenticated By: Harmon Pier, M.D.   Mm Digital Diagnostic Unilat R  07/12/2012   *RADIOLOGY REPORT*  Clinical Data:  Ultrasound-guided core needle biopsy of a hypoechoic area at 2 o'clock 2 cm from the right nipple with clip placement.  Known invasive mammary carcinoma in the right breast. Unsuccessful right breast MRI guided biopsy earlier today.  DIGITAL DIAGNOSTIC RIGHT MAMMOGRAM  Comparison:  Previous exams.  Findings:  Films are performed following ultrasound guided biopsy of a small hypoechoic area at 2 o'clock 2 cm from the right nipple. The ribbon clip is positioned within the area of concern. The ribbon clip is approximately 2.6 cm anterior to the top hat clip from the original biopsy on 06/20/2012.  IMPRESSION: Appropriate clip placement following ultrasound-guided core needle biopsy of a hypoechoic area at 2 o'clock 2 cm in the right nipple.   Original Report Authenticated By: Cain Saupe, M.D.   Mm Digital Diagnostic Unilat R  06/20/2012   *RADIOLOGY REPORT*  Clinical Data:  52 year old female - evaluate clip placement following ultrasound guided right breast biopsy.  DIGITAL DIAGNOSTIC RIGHT MAMMOGRAM  Comparison:  Previous exams.  Findings:  Films are performed following ultrasound guided biopsy of the 7 x 6 x 17 mm hypoechoic mass at the 3 o'clock position of the right breast 4 cm from the nipple. The T shaped biopsy clip is in satisfactory position.  IMPRESSION: Satisfactory clip placement following ultrasound guided right breast biopsy.  Pathology will be followed.   Original Report Authenticated By: Harmon Pier, M.D.   Dg Fluoro Guide Cv Line-no Report  07/06/2012   CLINICAL DATA: port placement   FLOURO GUIDE CV LINE  Fluoroscopy was utilized by the requesting physician.  No radiographic  interpretation.    [place] Rt Breast Bx W Loc Dev 1st  Lesion Img Bx Spec US Guide  07/13/2012   **ADDENDUM** CREATED: 07/13/2012 14:11:45  I spoke with the patient by telephone on 07/13/2012 to discuss pathology results.  Pathology demonstrates  pseudoangiomatous stromal hyperplasia.  The findings  were called to the office of Dr. Donell Beers. Although the imaging findings could be related to pseudoangiomatous stromal hyperplasia, excision of the anterior biopsy site is suggested at the time of patient's definitive surgery.  The patient is considering unilateral or bilateral mastectomy at this time.  The patient reports no problems at the biopsy site.  All questions were answered.  Recommendations:  Excision of the biopsy site. The patient is scheduled to begin chemotherapy in 4 days.  **END ADDENDUM** SIGNED BY: Blair Hailey. Manson Passey, M.D.  07/12/2012   *RADIOLOGY REPORT*  Clinical Data:  Unsuccessful MRI guided biopsy of the area of abnormal enhancement in the left anterior upper inner quadrant, anterior to the known invasive mammary carcinoma.  Second look ultrasound performed and a 5 x 4 x 4 mm hypoechoic area found at 2 o'clock 2 cm from the right nipple which will be biopsied.  ULTRASOUND GUIDED VACUUM ASSISTED CORE BIOPSY OF THE RIGHT BREAST  Sonography of the right upper inner quadrant was performed.  There is an ill-defined hypoechoic 5 x 4 x 4 mm area at 2 o'clock 2 cm from the right nipple which will be biopsied with ultrasound guidance.  The patient and I discussed the procedure of ultrasound-guided biopsy, including benefits and alternatives.  We discussed the high likelihood of a successful procedure. We discussed the risks of the procedure including infection, bleeding, tissue injury, clip migration, and inadequate sampling.  Written informed consent was given.  Using sterile technique, 2% lidocaine, ultrasound guidance, and a 12 gauge vacuum assisted needle, biopsy was performed of the hypoechoic area at 2 o'clock 2 cm from the right nipple using a caudocranial  approach.  At the conclusion of the procedure, a within tissue marker clip was deployed into the biopsy cavity. Follow-up 2-view mammogram was performed and dictated separately.  IMPRESSION: Ultrasound-guided biopsy of a 5 x 4 x 4 mm hypoechoic area at 2 o'clock 2 cm from the right nipple.  No apparent complications.   Original Report Authenticated By: Cain Saupe, M.D.   [place] Rt Breast Bx W Loc Dev 1st Lesion Img Bx Spec US Guide  06/21/2012   *RADIOLOGY REPORT*  Clinical Data:  Indeterminate 7 x 6 x 17 mm hypoechoic mass in the inner right breast - for tissue sampling.  ULTRASOUND GUIDED VACUUM ASSISTED CORE BIOPSY OF THE RIGHT BREAST  Comparison: Previous exams.  I met with the patient and we discussed the procedure of ultrasound- guided biopsy, including benefits and alternatives.  We discussed the high likelihood of a successful procedure. We discussed the risks of the procedure including infection, bleeding, tissue injury, clip migration, and inadequate sampling.  Informed written consent was given.  Using sterile technique, 2% lidocaine ultrasound guidance and a 12 gauge vacuum assisted needle biopsy was performed of the 7 x 6 x 17 mm hypoechoic mass at the 3 o'clock position of the right breast 4 cm from the nipple using a lateral approach.  At the conclusion of the procedure, a T shaped tissue marker clip was deployed into the biopsy cavity.  Follow-up 2-view mammogram was performed and dictated separately.  IMPRESSION: Ultrasound-guided biopsy of right breast mass.  No apparent complications.  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 Histology correlates with imaging findings.  The patient was contacted by phone on 06/21/2012 and these results given to her which she understood. Her questions were answered. She was encouraged to obtain breast cancer educational material from the [facility] at her convenience. The patient had no  complaints with her biopsy site.  Recommend  surgery/oncology consultation.  An appointment at the [facility] has been scheduled for 06/28/2012. Recommend bilateral breast MRI, which has been scheduled for 06/26/2012. The patient was informed of these appointments.   Original Report Authenticated By: Harmon Pier, M.D.    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 Patient had other areas seen on MRI biopsy of the second mass was negative. Patient has had her Port-A-Cath placed. She is proceeding neoadjuvant chemotherapy to possibly conserve her breast. Chemotherapy will consist of Adriamycin Cytoxan given dose dense for 4 cycles starting today 07/17/2012. She will receive Neulasta support on day 2 for each cycle. When she completes this then she will begin weekly Taxol and carboplatinum for a total of 12 weeks.  #2 we discussed risks benefits and side effects of chemotherapy. She has had an echocardiogram as well as chemotherapy teaching class. Port-A-Cath was placed by Dr. Donell Beers. The port is functioning well.  #3 I educated and counseled her regarding antiemetic use. She does have all of her medications available to her. She understands how to take them.   PLAN:  #1 Doing well.  Labs have recovered, proceed with chemotherapy.  I reviewed her chemotherapy regimens with her in detail.    #2 patient will return in one week's time for followup and evaluation for chemotoxicities.   All questions were answered. The patient knows to call the clinic with any problems, questions or concerns. We can certainly see the patient much sooner if necessary.  I spent 25 minutes counseling the patient face to face. The total time spent in the appointment was 30 minutes.  Cherie Ouch Lyn Hollingshead, NP Medical Oncology [facility] Phone: [phone] 07/31/2012, 4:22 PM

## 2012-07-31 NOTE — Patient Instructions (Addendum)
[facility] Discharge Instructions for Patients Receiving Chemotherapy  Today you received the following chemotherapy agents Adriamycin/Cytoxan To help prevent nausea and vomiting after your treatment, we encourage you to take your nausea medication as prescribed. If you develop nausea and vomiting that is not controlled by your nausea medication, call the clinic.   BELOW ARE SYMPTOMS THAT SHOULD BE REPORTED IMMEDIATELY:  *FEVER GREATER THAN 100.5 F  *CHILLS WITH OR WITHOUT FEVER  NAUSEA AND VOMITING THAT IS NOT CONTROLLED WITH YOUR NAUSEA MEDICATION  *UNUSUAL SHORTNESS OF BREATH  *UNUSUAL BRUISING OR BLEEDING  TENDERNESS IN MOUTH AND THROAT WITH OR WITHOUT PRESENCE OF ULCERS  *URINARY PROBLEMS  *BOWEL PROBLEMS  UNUSUAL RASH Items with * indicate a potential emergency and should be followed up as soon as possible.  Feel free to call the clinic you have any questions or concerns. The clinic phone number is [phone].    

## 2012-07-31 NOTE — Patient Instructions (Addendum)
Doing well.  Proceed with chemotherapy.  We will see you back next week.  Please call us if you have any questions or concerns.    

## 2012-08-01 ENCOUNTER — Ambulatory Visit (HOSPITAL_BASED_OUTPATIENT_CLINIC_OR_DEPARTMENT_OTHER): Payer: Self-pay

## 2012-08-01 ENCOUNTER — Other Ambulatory Visit: Payer: Self-pay | Admitting: Medical Oncology

## 2012-08-01 VITALS — BP 125/81 | HR 72 | Temp 97.6°F

## 2012-08-01 DIAGNOSIS — C50311 Malignant neoplasm of lower-inner quadrant of right female breast: Secondary | ICD-10-CM

## 2012-08-01 DIAGNOSIS — C50919 Malignant neoplasm of unspecified site of unspecified female breast: Secondary | ICD-10-CM

## 2012-08-01 DIAGNOSIS — Z5189 Encounter for other specified aftercare: Secondary | ICD-10-CM

## 2012-08-01 LAB — COMPREHENSIVE METABOLIC PANEL (CC13)
AST: 24 U/L (ref 5–34)
Albumin: 3.5 g/dL (ref 3.5–5.0)
Alkaline Phosphatase: 53 U/L (ref 40–150)
Potassium: 3.9 mEq/L (ref 3.5–5.1)
Sodium: 136 mEq/L (ref 136–145)
Total Protein: 7.2 g/dL (ref 6.4–8.3)

## 2012-08-01 MED ORDER — PEGFILGRASTIM INJECTION 6 MG/0.6ML
6.0000 mg | Freq: Once | SUBCUTANEOUS | Status: AC
  Administered 2012-08-01: 6 mg via SUBCUTANEOUS
  Filled 2012-08-01: qty 0.6

## 2012-08-01 MED ORDER — UNABLE TO FIND: 1.0000 "application " | Freq: Once | Status: DC

## 2012-08-05 ENCOUNTER — Encounter: Payer: Self-pay | Admitting: *Deleted

## 2012-08-05 NOTE — Progress Notes (Signed)
Mailed after appt letter to pt. 

## 2012-08-07 ENCOUNTER — Encounter: Payer: Self-pay | Admitting: Oncology

## 2012-08-07 ENCOUNTER — Ambulatory Visit (HOSPITAL_BASED_OUTPATIENT_CLINIC_OR_DEPARTMENT_OTHER): Payer: Medicaid Other | Admitting: Oncology

## 2012-08-07 ENCOUNTER — Other Ambulatory Visit (HOSPITAL_BASED_OUTPATIENT_CLINIC_OR_DEPARTMENT_OTHER): Payer: Medicaid Other | Admitting: Lab

## 2012-08-07 VITALS — BP 108/70 | HR 78 | Temp 98.2°F | Resp 18 | Ht 63.0 in | Wt 114.1 lb

## 2012-08-07 DIAGNOSIS — C50319 Malignant neoplasm of lower-inner quadrant of unspecified female breast: Secondary | ICD-10-CM

## 2012-08-07 DIAGNOSIS — C50311 Malignant neoplasm of lower-inner quadrant of right female breast: Secondary | ICD-10-CM

## 2012-08-07 LAB — CBC WITH DIFFERENTIAL/PLATELET
Eosinophils Absolute: 0 10*3/uL (ref 0.0–0.5)
MCV: 91.1 fL (ref 79.5–101.0)
MONO#: 0.1 10*3/uL (ref 0.1–0.9)
MONO%: 9.2 % (ref 0.0–14.0)
NEUT#: 0.2 10*3/uL — CL (ref 1.5–6.5)
RBC: 3.38 10*6/uL — ABNORMAL LOW (ref 3.70–5.45)
RDW: 12.2 % (ref 11.2–14.5)
WBC: 1 10*3/uL — ABNORMAL LOW (ref 3.9–10.3)
nRBC: 0 % (ref 0–0)

## 2012-08-07 LAB — COMPREHENSIVE METABOLIC PANEL (CC13)
ALT: 13 U/L (ref 0–55)
CO2: 28 mEq/L (ref 22–29)
Sodium: 136 mEq/L (ref 136–145)
Total Bilirubin: 0.45 mg/dL (ref 0.20–1.20)
Total Protein: 7.3 g/dL (ref 6.4–8.3)

## 2012-08-07 NOTE — Progress Notes (Signed)
OFFICE PROGRESS NOTE  CC  [name], MD [address] [place] [address] Dr. Everardo Beals Dr. 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 Patient had a palpable mass at the 3:00 position. She was seen at the breast Center on 06/20/2012 she had a mammogram and ultrasound showing a 1.7 cm mass at the 3:00 position. 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 Tumor was grade 3.  #2 MRIs performed on 06/26/2012 showed no biopsy mass at 3:00 but also 2 other adjacent masses at 1 at 2:00 measuring 0.8 x 1.6 x 2.2 cm andalso 6 mm ill-defined mass posterior to the biopsy-proven malignancy.  #3 patient has gone on to have a Port-A-Cath placed to begin neoadjuvant chemotherapy initially consisting of Adriamycin Cytoxan to be given dose dense for 4 cycles followed by weekly Taxol and carboplatinum for a total of 12 weeks.carefully agent made to have an excellent response to neoadjuvant chemotherapy and could possibly have breast preservation of course she may even need chest mastectomy with sentinel lymph node biopsies. Patient and I had met at the [facility] clinic we discussed the rationale for upfront chemotherapy she is now here to receive that  CURRENT THERAPY: cycle 2 day 8 of Adriamycin and Cytoxan  INTERVAL HISTORY: Jaime Murillo 52 y.o. female returns for chemotherapy toxicity evaluation. She is neutropenic. We will begin her on prophylactic antibiotics. She is denying any nausea vomiting fevers chills night sweats headaches she has no shortness of breath no chest pains no palpitations no myalgias or arthralgias or peripheral paresthesias. She has been eating well  drinking well no diarrhea or constipation. Remainder of the 10 point review of systems is negative.   MEDICAL HISTORY: Past Medical History  Diagnosis Date  . HSV infection   . NSVD (normal spontaneous vaginal delivery)   . Hypertension     takes HCTZ  . Urinary frequency   . Anemia     takes Ferrous Sulfate occasionally  . Breast cancer 2014    triple negative    ALLERGIES:  has No Known Allergies.  MEDICATIONS:  Current Outpatient Prescriptions  Medication Sig Dispense Refill  . ferrous sulfate 325 (65 FE) MG tablet Take 325 mg by mouth daily with breakfast.      . hydrochlorothiazide (HYDRODIURIL) 25 MG tablet Take 1 tablet (25 mg total) by mouth daily.  30 tablet  11  . LORazepam (ATIVAN) 0.5 MG tablet Take 0.5 mg by mouth every 6 (six) hours as needed for anxiety (as needed for nausea and vomitting).      . ciprofloxacin (CIPRO) 500 MG tablet Take 1 tablet (500 mg total) by mouth 2 (two) times daily.  14 tablet  5  . dexamethasone (DECADRON) 4 MG tablet Take 4 mg by mouth 2 (two) times daily with a meal. Take 2 tabs every day on the day after chemo an then take 2 tabs twice a day with food for 2 days.      Marland Kitchen lidocaine-prilocaine (EMLA) cream Apply 1 application topically as needed (apply to PAC site 1-2 hours prior to treatment.).      Marland Kitchen ondansetron (ZOFRAN) 8 MG tablet Take 8 mg by mouth every 8 (eight) hours as needed for nausea (  Take 1 pill by mouth twice a day as needed  for nausea and vomitting starting 3rd day after chemo).      Marland Kitchen oxyCODONE-acetaminophen (ROXICET) 5-325 MG per tablet Take 1-2 tablets by mouth every 4 (four) hours as needed for pain.  30 tablet  0  . prochlorperazine (COMPAZINE) 10 MG tablet Take 10 mg by mouth every 6 (six) hours as needed (for nausea and vomitting).      . prochlorperazine (COMPAZINE) 25 MG suppository Place 25 mg rectally every 12 (twelve) hours as needed for nausea (as needed for nausea and vomitting).      Marland Kitchen UNABLE TO FIND Apply 1  application topically once. 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  1 application  0  . valACYclovir (VALTREX) 500 MG tablet Take 500 mg by mouth daily as needed. For herpes       No current facility-administered medications for this visit.    SURGICAL HISTORY:  Past Surgical History  Procedure Laterality Date  . Tubal ligation  2003    LTL  . Portacath placement N/A 07/06/2012    Procedure: INSERTION PORT-A-CATH;  Surgeon: Almond Lint, MD;  Location: [facility] OR;  Service: General;  Laterality: N/A;    REVIEW OF SYSTEMS:  General: fatigue (-), night sweats (-), fever (-), pain (-) Lymph: palpable nodes (-) HEENT: vision changes (-), mucositis (-), gum bleeding (-), epistaxis (-) Cardiovascular: chest pain (-), palpitations (-) Pulmonary: shortness of breath (-), dyspnea on exertion (-), cough (-), hemoptysis (-) GI:  Early satiety (-), melena (-), dysphagia (-), nausea/vomiting (-), diarrhea (-) GU: dysuria (-), hematuria (-), incontinence (-) Musculoskeletal: joint swelling (-), joint pain (-), back pain (-) Neuro: weakness (-), numbness (-), headache (-), confusion (-) Skin: Rash (-), lesions (-), dryness (-) Psych: depression (-), suicidal/homicidal ideation (-), feeling of hopelessness (-)   HEALTH MAINTENANCE:   PHYSICAL EXAMINATION: Blood pressure 108/70, pulse 78, temperature 98.2 F (36.8 C), temperature source Oral, resp. rate 18, height 5\' 3"  (1.6 m), weight 114 lb 2 oz (51.767 kg). Body mass index is 20.22 kg/(m^2). General: Patient is a well appearing female in no acute distress HEENT: PERRLA, sclerae anicteric no conjunctival pallor, MMM Neck: supple, no palpable adenopathy Lungs: clear to auscultation bilaterally, no wheezes, rhonchi, or rales Cardiovascular: regular rate rhythm, S1, S2, no murmurs, rubs or gallops Abdomen: Soft, non-tender, non-distended, normoactive bowel sounds, no HSM Extremities: warm and well  perfused, no clubbing, cyanosis, or edema Skin: No rashes or lesions Neuro: Non-focal Breast examination: Left breast no masses or nipple discharge Right breast reveals 3 discrete masses at the 3:00 position 2:00 position.  It is nontender there is no nipple discharge. Conglomeration of these masses to my exam measure about 2-3cm. ECOG PERFORMANCE STATUS: 0 - Asymptomatic      LABORATORY DATA: Lab Results  Component Value Date   WBC 1.0* 08/07/2012   HGB 10.8* 08/07/2012   HCT 30.8* 08/07/2012   MCV 91.1 08/07/2012   PLT 211 08/07/2012      Chemistry      Component Value Date/Time   NA 136 08/07/2012 1157   NA 137 06/06/2012 0853   K 3.7 08/07/2012 1157   K 3.8 06/06/2012 0853   CL 104 07/17/2012 1357   CL 102 06/06/2012 0853   CO2 28 08/07/2012 1157   CO2 26 06/06/2012 0853   BUN 13.9 08/07/2012 1157   BUN 12 06/06/2012 0853   CREATININE 0.7 08/07/2012 1157  CREATININE 0.70 06/06/2012 0853   CREATININE 0.85 04/24/2009 1114      Component Value Date/Time   CALCIUM 9.4 08/07/2012 1157   CALCIUM 9.3 06/06/2012 0853   ALKPHOS 73 08/07/2012 1157   ALKPHOS 35* 06/06/2012 0853   AST 14 08/07/2012 1157   AST 14 06/06/2012 0853   ALT 13 08/07/2012 1157   ALT 9 06/06/2012 0853   BILITOT 0.45 08/07/2012 1157   BILITOT 0.8 06/06/2012 0853     ADDITIONAL INFORMATION: PROGNOSTIC INDICATORS - ACIS Results: IMMUNOHISTOCHEMICAL AND MORPHOMETRIC ANALYSIS BY THE AUTOMATED CELLULAR IMAGING SYSTEM (ACIS) Estrogen Receptor: 0%, NEGATIVE Progesterone Receptor: 0%, NEGATIVE Proliferation Marker Ki67: 95% COMMENT: The negative hormone receptor study(ies) in this case have an internal positive control. REFERENCE RANGE ESTROGEN RECEPTOR NEGATIVE <1% POSITIVE =>1% PROGESTERONE RECEPTOR NEGATIVE <1% POSITIVE =>1% All controls stained appropriately Pecola Leisure MD Pathologist, Electronic Signature ( Signed 06/28/2012) CHROMOGENIC IN-SITU HYBRIDIZATION Results: HER-2/NEU BY CISH - NO AMPLIFICATION OF  HER-2 DETECTED. RESULT RATIO OF HER2: CEP 17 SIGNALS 1.26 1 of 3 Duplicate copy FINAL for CHYREL, TAHA [phone]) ADDITIONAL INFORMATION:(continued) AVERAGE HER2 COPY NUMBER PER CELL 2.45 REFERENCE RANGE NEGATIVE HER2/Chr17 Ratio <2.0 and Average HER2 copy number <4.0 EQUIVOCAL HER2/Chr17 Ratio <2.0 and Average HER2 copy number 4.0 and <6.0 POSITIVE HER2/Chr17 Ratio >=2.0 and/or Average HER2 copy number >=6.0 Jimmy Picket MD Pathologist, Electronic Signature ( Signed 06/26/2012) FINAL DIAGNOSIS Diagnosis Breast, right, needle core biopsy, 3:00 position - INVASIVE DUCTAL CARCINOMA SEE COMMENT. - DUCTAL CARCINOMA IN SITU Microscopic Comment Although the grade of tumor is best assessed at resection, with these biopsies, both the invasive and in situ carcinoma are grade III. Breast prognostic studies are pending and will be reported in an addendum. The case is reviewed with Dr. Raynald Blend who concurs. (CRR:gt, 06/21/12) [place] RUND DO Pathologist, Electronic FINAL DIAGNOSIS Diagnosis Breast, right, needle core biopsy, mass - BENIGN BREAST TISSUE WITH FOCAL PSEUDOANGIOMATOUS STROMAL HYPERPLASIA (PASH). NO EVIDENCE OF MALIGNANCY. Microscopic Comment Called to the [facility] on 07/13/12. (JDP:caf 07/13/12)  RADIOGRAPHIC STUDIES:  Dg Chest 2 View  07/04/2012   *RADIOLOGY REPORT*  Clinical Data: Insertion of Port-A-Cath.  Breast cancer. Hypertension.  CHEST - 2 VIEW  Comparison: CT of the chest on 07/03/2012  Findings: Cardiomediastinal silhouette is within normal limits. The lungs are free of focal consolidations and pleural effusions. Bony structures have a normal appearance.  Contrast is identified within the colon following CT of the abdomen.  IMPRESSION: Negative exam.   Original Report Authenticated By: Norva Pavlov, M.D.   Ct Chest W Contrast  07/03/2012   *RADIOLOGY REPORT*  Clinical Data:  Breast cancer.  No therapy yet.  Lower inner quadrant of right breast.   CT CHEST, ABDOMEN AND PELVIS WITH CONTRAST  Technique: Contiguous axial images of the chest abdomen and pelvis were obtained after IV contrast administration.  Contrast: 80 ml Omnipaque-300  Comparison: Breast MR of 06/26/2012.  No prior CTs.  CT CHEST  Findings: Lung windows demonstrate minimal left lower lobe subpleural nodularity at 3 mm on image 40/series 4.  Right lung clear.  Soft tissue windows demonstrate small bilateral axillary nodes, without adenopathy.  No subpectoral adenopathy. No supraclavicular adenopathy.  Normal heart size, without pericardial effusion.  No central pulmonary embolism, on this non-dedicated study.  No mediastinal or hilar adenopathy.  No internal mammary adenopathy. Minimal residual thymic tissue in the anterior mediastinum (image 17/series 2).  IMPRESSION:  1. No acute process or evidence of metastatic disease in the chest.  2.  Probable subpleural lymph node in the left lower lobe. Recommend attention on follow-up.  CT ABDOMEN AND PELVIS  Findings:  Mild early phase of hepatic enhancement which decreases sensitivity for focal liver lesions.  None identified.  Normal spleen, stomach, pancreas, gallbladder, biliary tract, adrenal glands, kidneys. No retroperitoneal or retrocrural adenopathy.  Normal colon, appendix, and terminal ileum.  Normal small bowel without abdominal ascites.  No pelvic adenopathy.    Normal urinary bladder and uterus.  No adnexal mass.  Trace free pelvic fluid is likely physiologic.  Transitional right-sided lumbosacral anatomy.  Degenerative disc disease at the lumbosacral junction.  IMPRESSION:  No acute process or evidence of metastatic disease in the abdomen or pelvis.   Original Report Authenticated By: Jeronimo Greaves, M.D.   US Breast Right  06/29/2012   *RADIOLOGY REPORT*  Clinical Data:  52 year old female with newly diagnosed right breast cancer.  Suspicious abnormal enhancement anterior and superior to the biopsy-proven neoplasm - for second look  ultrasound evaluation.  RIGHT BREAST ULTRASOUND  Comparison:  Prior mammograms, ultrasounds and MRI.  Findings: Ultrasound is performed, showing the biopsy-proven neoplasm at the 3 o'clock position of the right breast 4 cm from the nipple. No other discrete sonographic abnormalities are identified, specifically medial, superior, and anterior to the biopsy-proven neoplasm.  IMPRESSION: Patchy abnormal enhancement medial superior to the biopsy-proven neoplasm identified on recent MRI is not visualized sonographically as a discrete abnormality.  MR guided biopsy is recommended if breast conservation surgery is considered.  BI-RADS CATEGORY 6:  Known biopsy-proven malignancy - appropriate action should be taken.  RECOMMENDATION: MR guided right breast biopsy, which will be scheduled by our office.  I have discussed the findings and recommendations with the patient. Results were also provided in writing at the conclusion of the visit.  If applicable, a reminder letter will be sent to the patient regarding the next appointment.   Original Report Authenticated By: Harmon Pier, M.D.   US Breast Right  06/20/2012   *RADIOLOGY REPORT*  Clinical Data:  52 year old female for annual bilateral mammograms and palpable mass in the upper and inner right breast discovered on clinical examination.  DIGITAL DIAGNOSTIC BILATERAL MAMMOGRAM WITH CAD AND RIGHT BREAST ULTRASOUND:  Comparison:  03/21/2009 and prior mammograms dating back to 01/21/2005  Findings:  ACR Breast Density Category 4: The breast tissue is extremely dense.  Routine and spot compression views of the right breast demonstrate no evidence of suspicious mass or distortion. Scattered punctate calcifications within the breasts bilaterally are again identified.  Mammographic images were processed with CAD.  On physical exam, thickening is identified at the 12 o'clock position of the right breast 4 cm from the nipple. A firm palpable mass identified at the 3 o'clock  position of the right breast 4 cm from the nipple.  Ultrasound is performed, showing no evidence of solid or cystic mass, distortion or abnormal areas of shadowing at the 12 o'clock position of the right breast. A 7 x 6 x 17 mm slightly irregular oval hypoechoic mass with calcifications is horizontally oriented at the 3 o'clock position of the right breast 4 cm from the nipple.  IMPRESSION: Indeterminate 7 x 6 x 17 mm hypoechoic mass at the 3 o'clock position of the right breast.  Tissue sampling is recommended.  No mammographic evidence of breast malignancy bilaterally.  No mammographic, palpable or sonographic abnormality at the 12 o'clock position of the right breast, in the area of palpable concern.  BI-RADS CATEGORY 4:  Suspicious  abnormality - biopsy should be considered.  RECOMMENDATION:  Ultrasound guided right breast biopsy, which will be performed today but dictated in a separate report.  I have discussed the findings and recommendations with the patient. Results were also provided in writing at the conclusion of the visit.  If applicable, a reminder letter will be sent to the patient regarding the next appointment.   Original Report Authenticated By: Harmon Pier, M.D.   Ct Abdomen Pelvis W Contrast  07/03/2012   *RADIOLOGY REPORT*  Clinical Data:  Breast cancer.  No therapy yet.  Lower inner quadrant of right breast.  CT CHEST, ABDOMEN AND PELVIS WITH CONTRAST  Technique: Contiguous axial images of the chest abdomen and pelvis were obtained after IV contrast administration.  Contrast: 80 ml Omnipaque-300  Comparison: Breast MR of 06/26/2012.  No prior CTs.  CT CHEST  Findings: Lung windows demonstrate minimal left lower lobe subpleural nodularity at 3 mm on image 40/series 4.  Right lung clear.  Soft tissue windows demonstrate small bilateral axillary nodes, without adenopathy.  No subpectoral adenopathy. No supraclavicular adenopathy.  Normal heart size, without pericardial effusion.  No central  pulmonary embolism, on this non-dedicated study.  No mediastinal or hilar adenopathy.  No internal mammary adenopathy. Minimal residual thymic tissue in the anterior mediastinum (image 17/series 2).  IMPRESSION:  1. No acute process or evidence of metastatic disease in the chest. 2.  Probable subpleural lymph node in the left lower lobe. Recommend attention on follow-up.  CT ABDOMEN AND PELVIS  Findings:  Mild early phase of hepatic enhancement which decreases sensitivity for focal liver lesions.  None identified.  Normal spleen, stomach, pancreas, gallbladder, biliary tract, adrenal glands, kidneys. No retroperitoneal or retrocrural adenopathy.  Normal colon, appendix, and terminal ileum.  Normal small bowel without abdominal ascites.  No pelvic adenopathy.    Normal urinary bladder and uterus.  No adnexal mass.  Trace free pelvic fluid is likely physiologic.  Transitional right-sided lumbosacral anatomy.  Degenerative disc disease at the lumbosacral junction.  IMPRESSION:  No acute process or evidence of metastatic disease in the abdomen or pelvis.   Original Report Authenticated By: Jeronimo Greaves, M.D.   Mr Breast Right W Wo Contrast  07/12/2012   *RADIOLOGY REPORT*  Clinical Data:  Known invasive mammary carcinoma in the right breast at 3 o'clock posteriorly.  Additional site of abnormal enhancement in the right upper inner quadrant closer to the nipple for possible biopsy.  ATTEMPTED MRI GUIDED VACUUM ASSISTED BIOPSY OF THE RIGHT BREAST WITHOUT AND WITH CONTRAST  Comparison: Previous exams.  Technique: Multiplanar, multisequence MR images of the right breast were obtained prior to and following the intravenous administration of 9 ml of Mulithance.  I met with the patient, and we discussed the procedure of MRI guided biopsy, including risks, benefits, and alternatives. Specifically, we discussed the risks of infection, bleeding, tissue injury, clip migration, and inadequate sampling.  Informed, written consent  was given.  Using sterile technique, 2% Lidocaine, MRI guidance, and a 9 gauge vacuum assisted device, biopsy was attempted using a mediolateral approach.  However, the procedure was unsuccessful due to the extremely thin breast compression.  IMPRESSION:  Unsuccessful MRI guided vacuum-assisted biopsy of the right breast.  Patient was sent to the [facility] for further evaluation with ultrasound.   Original Report Authenticated By: Cain Saupe, M.D.   Mr Breast Bilateral W Wo Contrast  06/26/2012   *RADIOLOGY REPORT*  Clinical Data: History of new diagnosis of breast cancer from recent  ultrasound guided core needle biopsy right breast 06/20/2012 demonstrating invasive ductal carcinoma with DCIS.  GFR 89, creatinine 0.7 on 06/06/2012.  BILATERAL BREAST MRI WITH AND WITHOUT CONTRAST  Technique: Multiplanar, multisequence MR images of both breasts were obtained prior to and following the intravenous administration of 10ml of multihance.  Three dimensional images were evaluated at the independent DynaCad workstation.  Comparison:  Recent mammograms and ultrasound.  Findings: Examination demonstrates mild background parenchymal enhancement.  Right breast:  There is evidence of an ovoid gently lobulated and somewhat ill-defined heterogeneously enhancing mass over the deep third of the inner midportion of the right breast representing patient's biopsy-proven malignancy.  This mass contains central clip artifact and measures approximately 0.9 x 1.9 x 1.0 cm. The anterior border of this mass is located 5.5 cm from the nipple. There is a 6 mm slightly ill-defined enhancing ovoid mass 6 mm posterior lateral to the biopsy-proven malignancy lying just superficial to the pectoralis muscle.  There is also clumped non mass enhancement anterior and slightly superior to the biopsy- proven malignancy in the middle third at approximately the 2 o'clock position.  This measures approximately 0.8 x 1.6 x 1.2 cm and is located 3  cm from the nipple.  This is suspicious for multifocal disease.  No suspicious axillary or internal mammary lymph nodes.  Left breast:  No suspicious mass, enhancement or adenopathy.  IMPRESSION: Known biopsy-proven malignancy over the deep third of the inner mid right breast.  6 mm ill-defined enhancing ovoid mass 6 mm posterior lateral to the biopsy-proven malignancy and lying just superficial to the pectoralis muscle.  Clumped non mass enhancement anterior and superior to patient's known malignancy at approximately the 2 o'clock position measuring 0.8 x 1.6 x 2.2 cm.  Findings likely represent multi focal disease.  RECOMMENDATION: Recommend second look ultrasound and core biopsy if feasible of the clumped non mass enhancement at approximately the 2 o'clock position right breast anterior/superior to the known malignancy. If not seen by ultrasound, recommend proceeding to MR guided biopsy of this suspicious abnormality.  THREE-DIMENSIONAL MR IMAGE RENDERING ON INDEPENDENT WORKSTATION:  Three-dimensional MR images were rendered by post-processing of the original MR data on an independent workstation.  The three- dimensional MR images were interpreted, and findings were reported in the accompanying complete MRI report for this study.  BI-RADS CATEGORY 0:  Incomplete.  Need additional imaging evaluation and/or prior mammograms for comparison.   Original Report Authenticated By: Elberta Fortis, M.D.   Dg Chest Port 1 View  07/06/2012   *RADIOLOGY REPORT*  Clinical Data: Post left-sided port catheter insertion  PORTABLE CHEST - 1 VIEW  Comparison: 07/04/2012  Findings:  Grossly unchanged cardiac silhouette and mediastinal contours. Interval placement of a left anterior chest wall subclavian vein approach port-a-catheter with tip projecting over the superior cavoatrial junction. Grossly unchanged symmetric biapical pleural parenchymal thickening.  No pneumothorax.  No focal airspace opacities.  No pleural effusion.  No  evidence of edema.  Unchanged bones.  IMPRESSION: Interval placement of a left anterior chest wall subclavian vein approach port-a-catheter with tip overlying the superior cavoatrial junction.  No pneumothorax.   Original Report Authenticated By: Tacey Ruiz, MD   Mm Digital Diagnostic Bilat  06/20/2012   *RADIOLOGY REPORT*  Clinical Data:  52 year old female for annual bilateral mammograms and palpable mass in the upper and inner right breast discovered on clinical examination.  DIGITAL DIAGNOSTIC BILATERAL MAMMOGRAM WITH CAD AND RIGHT BREAST ULTRASOUND:  Comparison:  03/21/2009 and prior mammograms dating  back to 01/21/2005  Findings:  ACR Breast Density Category 4: The breast tissue is extremely dense.  Routine and spot compression views of the right breast demonstrate no evidence of suspicious mass or distortion. Scattered punctate calcifications within the breasts bilaterally are again identified.  Mammographic images were processed with CAD.  On physical exam, thickening is identified at the 12 o'clock position of the right breast 4 cm from the nipple. A firm palpable mass identified at the 3 o'clock position of the right breast 4 cm from the nipple.  Ultrasound is performed, showing no evidence of solid or cystic mass, distortion or abnormal areas of shadowing at the 12 o'clock position of the right breast. A 7 x 6 x 17 mm slightly irregular oval hypoechoic mass with calcifications is horizontally oriented at the 3 o'clock position of the right breast 4 cm from the nipple.  IMPRESSION: Indeterminate 7 x 6 x 17 mm hypoechoic mass at the 3 o'clock position of the right breast.  Tissue sampling is recommended.  No mammographic evidence of breast malignancy bilaterally.  No mammographic, palpable or sonographic abnormality at the 12 o'clock position of the right breast, in the area of palpable concern.  BI-RADS CATEGORY 4:  Suspicious abnormality - biopsy should be considered.  RECOMMENDATION:  Ultrasound  guided right breast biopsy, which will be performed today but dictated in a separate report.  I have discussed the findings and recommendations with the patient. Results were also provided in writing at the conclusion of the visit.  If applicable, a reminder letter will be sent to the patient regarding the next appointment.   Original Report Authenticated By: Harmon Pier, M.D.   Mm Digital Diagnostic Unilat R  07/12/2012   *RADIOLOGY REPORT*  Clinical Data:  Ultrasound-guided core needle biopsy of a hypoechoic area at 2 o'clock 2 cm from the right nipple with clip placement.  Known invasive mammary carcinoma in the right breast. Unsuccessful right breast MRI guided biopsy earlier today.  DIGITAL DIAGNOSTIC RIGHT MAMMOGRAM  Comparison:  Previous exams.  Findings:  Films are performed following ultrasound guided biopsy of a small hypoechoic area at 2 o'clock 2 cm from the right nipple. The ribbon clip is positioned within the area of concern. The ribbon clip is approximately 2.6 cm anterior to the top hat clip from the original biopsy on 06/20/2012.  IMPRESSION: Appropriate clip placement following ultrasound-guided core needle biopsy of a hypoechoic area at 2 o'clock 2 cm in the right nipple.   Original Report Authenticated By: Cain Saupe, M.D.   Mm Digital Diagnostic Unilat R  06/20/2012   *RADIOLOGY REPORT*  Clinical Data:  52 year old female - evaluate clip placement following ultrasound guided right breast biopsy.  DIGITAL DIAGNOSTIC RIGHT MAMMOGRAM  Comparison:  Previous exams.  Findings:  Films are performed following ultrasound guided biopsy of the 7 x 6 x 17 mm hypoechoic mass at the 3 o'clock position of the right breast 4 cm from the nipple. The T shaped biopsy clip is in satisfactory position.  IMPRESSION: Satisfactory clip placement following ultrasound guided right breast biopsy.  Pathology will be followed.   Original Report Authenticated By: Harmon Pier, M.D.   Dg Fluoro Guide Cv Line-no  Report  07/06/2012   CLINICAL DATA: port placement   FLOURO GUIDE CV LINE  Fluoroscopy was utilized by the requesting physician.  No radiographic  interpretation.    [place] Rt Breast Bx W Loc Dev 1st Lesion Img Bx Spec US Guide  07/13/2012   **ADDENDUM** CREATED:  07/13/2012 14:11:45  I spoke with the patient by telephone on 07/13/2012 to discuss pathology results.  Pathology demonstrates  pseudoangiomatous stromal hyperplasia.  The findings were called to the office of Dr. Donell Beers. Although the imaging findings could be related to pseudoangiomatous stromal hyperplasia, excision of the anterior biopsy site is suggested at the time of patient's definitive surgery.  The patient is considering unilateral or bilateral mastectomy at this time.  The patient reports no problems at the biopsy site.  All questions were answered.  Recommendations:  Excision of the biopsy site. The patient is scheduled to begin chemotherapy in 4 days.  **END ADDENDUM** SIGNED BY: Blair Hailey. Manson Passey, M.D.  07/12/2012   *RADIOLOGY REPORT*  Clinical Data:  Unsuccessful MRI guided biopsy of the area of abnormal enhancement in the left anterior upper inner quadrant, anterior to the known invasive mammary carcinoma.  Second look ultrasound performed and a 5 x 4 x 4 mm hypoechoic area found at 2 o'clock 2 cm from the right nipple which will be biopsied.  ULTRASOUND GUIDED VACUUM ASSISTED CORE BIOPSY OF THE RIGHT BREAST  Sonography of the right upper inner quadrant was performed.  There is an ill-defined hypoechoic 5 x 4 x 4 mm area at 2 o'clock 2 cm from the right nipple which will be biopsied with ultrasound guidance.  The patient and I discussed the procedure of ultrasound-guided biopsy, including benefits and alternatives.  We discussed the high likelihood of a successful procedure. We discussed the risks of the procedure including infection, bleeding, tissue injury, clip migration, and inadequate sampling.  Written informed consent was given.   Using sterile technique, 2% lidocaine, ultrasound guidance, and a 12 gauge vacuum assisted needle, biopsy was performed of the hypoechoic area at 2 o'clock 2 cm from the right nipple using a caudocranial approach.  At the conclusion of the procedure, a within tissue marker clip was deployed into the biopsy cavity. Follow-up 2-view mammogram was performed and dictated separately.  IMPRESSION: Ultrasound-guided biopsy of a 5 x 4 x 4 mm hypoechoic area at 2 o'clock 2 cm from the right nipple.  No apparent complications.   Original Report Authenticated By: Cain Saupe, M.D.   [place] Rt Breast Bx W Loc Dev 1st Lesion Img Bx Spec US Guide  06/21/2012   *RADIOLOGY REPORT*  Clinical Data:  Indeterminate 7 x 6 x 17 mm hypoechoic mass in the inner right breast - for tissue sampling.  ULTRASOUND GUIDED VACUUM ASSISTED CORE BIOPSY OF THE RIGHT BREAST  Comparison: Previous exams.  I met with the patient and we discussed the procedure of ultrasound- guided biopsy, including benefits and alternatives.  We discussed the high likelihood of a successful procedure. We discussed the risks of the procedure including infection, bleeding, tissue injury, clip migration, and inadequate sampling.  Informed written consent was given.  Using sterile technique, 2% lidocaine ultrasound guidance and a 12 gauge vacuum assisted needle biopsy was performed of the 7 x 6 x 17 mm hypoechoic mass at the 3 o'clock position of the right breast 4 cm from the nipple using a lateral approach.  At the conclusion of the procedure, a T shaped tissue marker clip was deployed into the biopsy cavity.  Follow-up 2-view mammogram was performed and dictated separately.  IMPRESSION: Ultrasound-guided biopsy of right breast mass.  No apparent complications.  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 Histology correlates with imaging findings.  The patient was contacted by phone on 06/21/2012 and these results given to her which  she understood.  Her questions were answered. She was encouraged to obtain breast cancer educational material from the [facility] at her convenience. The patient had no complaints with her biopsy site.  Recommend surgery/oncology consultation.  An appointment at the [facility] has been scheduled for 06/28/2012. Recommend bilateral breast MRI, which has been scheduled for 06/26/2012. The patient was informed of these appointments.   Original Report Authenticated By: Harmon Pier, M.D.    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 Patient had other areas seen on MRI biopsy of the second mass was negative. Patient has had her Port-A-Cath placed. She is proceeding neoadjuvant chemotherapy to possibly conserve her breast. Chemotherapy will consist of Adriamycin Cytoxan given dose dense for 4 cycles starting today 07/17/2012. She will receive Neulasta support on day 2 for each cycle. When she completes this then she will begin weekly Taxol and carboplatinum for a total of 12 weeks.  #2 we discussed risks benefits and side effects of chemotherapy. She has had an echocardiogram as well as chemotherapy teaching class. Port-A-Cath was placed by Dr. Donell Beers. The port is functioning well.  #3 I educated and counseled her regarding antiemetic use. She does have all of her medications available to her. She understands how to take them.   PLAN:  #1 Doing well but she is neutropenic. I have recommended neutropenic precautions. She will also begin Cipro 500 mg twice a day. She is instructed on how to take her nausea medicines.  #2 I will see her back in one week's time for cycle 3 of chemotherapy.  All questions were answered. The patient knows to call the clinic with any problems, questions or concerns. We can certainly see the patient much sooner if necessary.  I spent 25 minutes counseling the patient face to face.  The total time spent in the appointment was 30 minutes.   Drue Second, MD Medical/Oncology [facility] [phone] (beeper) [phone] (Office)  08/07/2012, 2:06 PM

## 2012-08-07 NOTE — Progress Notes (Signed)
Patient is uninsured. Will see if she has applied for any insurance.

## 2012-08-07 NOTE — Progress Notes (Signed)
Patient came in and she said she was told she was approved for medicaid and would get the card in about 7-14 business days.

## 2012-08-07 NOTE — Patient Instructions (Addendum)
Doing well  You are neutropenic and please start your cipro 500 mg twice a day  I will see you back in 1 week for follow up and next cycle of chemotherapy

## 2012-08-08 ENCOUNTER — Ambulatory Visit: Payer: Managed Care, Other (non HMO)

## 2012-08-08 ENCOUNTER — Telehealth: Payer: Self-pay | Admitting: Oncology

## 2012-08-08 NOTE — Telephone Encounter (Signed)
Per 7/14 pof f/u as scheduled

## 2012-08-14 ENCOUNTER — Ambulatory Visit (HOSPITAL_BASED_OUTPATIENT_CLINIC_OR_DEPARTMENT_OTHER): Payer: Medicaid Other | Admitting: Oncology

## 2012-08-14 ENCOUNTER — Other Ambulatory Visit (HOSPITAL_BASED_OUTPATIENT_CLINIC_OR_DEPARTMENT_OTHER): Payer: Medicaid Other | Admitting: Lab

## 2012-08-14 ENCOUNTER — Ambulatory Visit (HOSPITAL_BASED_OUTPATIENT_CLINIC_OR_DEPARTMENT_OTHER): Payer: Self-pay

## 2012-08-14 VITALS — BP 122/75 | HR 82 | Temp 98.3°F | Resp 20 | Ht 63.0 in | Wt 117.8 lb

## 2012-08-14 DIAGNOSIS — C50919 Malignant neoplasm of unspecified site of unspecified female breast: Secondary | ICD-10-CM

## 2012-08-14 DIAGNOSIS — Z171 Estrogen receptor negative status [ER-]: Secondary | ICD-10-CM

## 2012-08-14 DIAGNOSIS — C50311 Malignant neoplasm of lower-inner quadrant of right female breast: Secondary | ICD-10-CM

## 2012-08-14 DIAGNOSIS — Z5111 Encounter for antineoplastic chemotherapy: Secondary | ICD-10-CM

## 2012-08-14 LAB — CBC WITH DIFFERENTIAL/PLATELET
BASO%: 0.6 % (ref 0.0–2.0)
EOS%: 0 % (ref 0.0–7.0)
LYMPH%: 16.2 % (ref 14.0–49.7)
MCH: 31.5 pg (ref 25.1–34.0)
MCHC: 34.6 g/dL (ref 31.5–36.0)
MCV: 90.9 fL (ref 79.5–101.0)
MONO#: 1.2 10*3/uL — ABNORMAL HIGH (ref 0.1–0.9)
MONO%: 12.9 % (ref 0.0–14.0)
Platelets: 265 10*3/uL (ref 145–400)
RBC: 3.08 10*6/uL — ABNORMAL LOW (ref 3.70–5.45)
WBC: 9.4 10*3/uL (ref 3.9–10.3)

## 2012-08-14 LAB — COMPREHENSIVE METABOLIC PANEL (CC13)
ALT: 12 U/L (ref 0–55)
AST: 18 U/L (ref 5–34)
Alkaline Phosphatase: 58 U/L (ref 40–150)
Sodium: 139 mEq/L (ref 136–145)
Total Bilirubin: <0.2 mg/dL (ref 0.20–1.20)
Total Protein: 6.7 g/dL (ref 6.4–8.3)

## 2012-08-14 MED ORDER — SODIUM CHLORIDE 0.9 % IJ SOLN
10.0000 mL | INTRAMUSCULAR | Status: DC | PRN
  Administered 2012-08-14: 10 mL
  Filled 2012-08-14: qty 10

## 2012-08-14 MED ORDER — HEPARIN SOD (PORK) LOCK FLUSH 100 UNIT/ML IV SOLN
500.0000 [IU] | Freq: Once | INTRAVENOUS | Status: AC | PRN
  Administered 2012-08-14: 500 [IU]
  Filled 2012-08-14: qty 5

## 2012-08-14 MED ORDER — SODIUM CHLORIDE 0.9 % IV SOLN
Freq: Once | INTRAVENOUS | Status: AC
  Administered 2012-08-14: 15:00:00 via INTRAVENOUS

## 2012-08-14 MED ORDER — PALONOSETRON HCL INJECTION 0.25 MG/5ML
0.2500 mg | Freq: Once | INTRAVENOUS | Status: AC
  Administered 2012-08-14: 0.25 mg via INTRAVENOUS

## 2012-08-14 MED ORDER — DEXAMETHASONE SODIUM PHOSPHATE 20 MG/5ML IJ SOLN
12.0000 mg | Freq: Once | INTRAMUSCULAR | Status: AC
  Administered 2012-08-14: 12 mg via INTRAVENOUS

## 2012-08-14 MED ORDER — DOXORUBICIN HCL CHEMO IV INJECTION 2 MG/ML
60.0000 mg/m2 | Freq: Once | INTRAVENOUS | Status: AC
  Administered 2012-08-14: 92 mg via INTRAVENOUS
  Filled 2012-08-14: qty 46

## 2012-08-14 MED ORDER — SODIUM CHLORIDE 0.9 % IV SOLN
150.0000 mg | Freq: Once | INTRAVENOUS | Status: AC
  Administered 2012-08-14: 150 mg via INTRAVENOUS
  Filled 2012-08-14: qty 5

## 2012-08-14 MED ORDER — SODIUM CHLORIDE 0.9 % IV SOLN
600.0000 mg/m2 | Freq: Once | INTRAVENOUS | Status: AC
  Administered 2012-08-14: 920 mg via INTRAVENOUS
  Filled 2012-08-14: qty 46

## 2012-08-14 NOTE — Patient Instructions (Signed)
[facility] Discharge Instructions for Patients Receiving Chemotherapy  Today you received the following chemotherapy agents Adriamycin and cytoxan.  To help prevent nausea and vomiting after your treatment, we encourage you to take your nausea medication Zofran 8 mg, compazine10 mg or lorazepam.   If you develop nausea and vomiting that is not controlled by your nausea medication, call the clinic.   BELOW ARE SYMPTOMS THAT SHOULD BE REPORTED IMMEDIATELY:  *FEVER GREATER THAN 100.5 F  *CHILLS WITH OR WITHOUT FEVER  NAUSEA AND VOMITING THAT IS NOT CONTROLLED WITH YOUR NAUSEA MEDICATION  *UNUSUAL SHORTNESS OF BREATH  *UNUSUAL BRUISING OR BLEEDING  TENDERNESS IN MOUTH AND THROAT WITH OR WITHOUT PRESENCE OF ULCERS  *URINARY PROBLEMS  *BOWEL PROBLEMS  UNUSUAL RASH Items with * indicate a potential emergency and should be followed up as soon as possible.  Feel free to call the clinic you have any questions or concerns. The clinic phone number is [phone].

## 2012-08-14 NOTE — Progress Notes (Signed)
Discharged at 1745, ambulatory in no distress with her sister.

## 2012-08-14 NOTE — Progress Notes (Signed)
OFFICE PROGRESS NOTE  CC  [name], MD [address] [place] [address] Dr. Everardo Beals Dr. 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 Patient had a palpable mass at the 3:00 position. She was seen at the breast Center on 06/20/2012 she had a mammogram and ultrasound showing a 1.7 cm mass at the 3:00 position. 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 Tumor was grade 3.  #2 MRIs performed on 06/26/2012 showed no biopsy mass at 3:00 but also 2 other adjacent masses at 1 at 2:00 measuring 0.8 x 1.6 x 2.2 cm andalso 6 mm ill-defined mass posterior to the biopsy-proven malignancy.  #3 patient has gone on to have a Port-A-Cath placed to begin neoadjuvant chemotherapy initially consisting of Adriamycin Cytoxan to be given dose dense for 4 cycles followed by weekly Taxol and carboplatinum for a total of 12 weeks.carefully agent made to have an excellent response to neoadjuvant chemotherapy and could possibly have breast preservation of course she may even need chest mastectomy with sentinel lymph node biopsies. Patient and I had met at the [facility] clinic we discussed the rationale for upfront chemotherapy she is now here to receive that  CURRENT THERAPY: cycle 3 day 1 of Adriamycin and Cytoxan  INTERVAL HISTORY: Angalena R Montoro 52 y.o. female returns for evaluation prior to her third cycle of Adriamycin/Cytoxan.  She is doing well. Her blood counts have now recovered. On her previous visit she was neutropenic. She was begun on neutropenic precautions as well as prophylactic antibiotics. Today she has no nausea vomiting fevers chills or night sweats. No peripheral  paresthesias. She does tell me that she developed a little bit of soreness in her mouth but that has now resolved. She has noticed dark spots on her tongue which I have assured her is normal. Remainder of the 10 point review of systems is negative. MEDICAL HISTORY: Past Medical History  Diagnosis Date  . HSV infection   . NSVD (normal spontaneous vaginal delivery)   . Hypertension     takes HCTZ  . Urinary frequency   . Anemia     takes Ferrous Sulfate occasionally  . Breast cancer 2014    triple negative    ALLERGIES:  has No Known Allergies.  MEDICATIONS:  Current Outpatient Prescriptions  Medication Sig Dispense Refill  . ciprofloxacin (CIPRO) 500 MG tablet Take 1 tablet (500 mg total) by mouth 2 (two) times daily.  14 tablet  5  . dexamethasone (DECADRON) 4 MG tablet Take 4 mg by mouth 2 (two) times daily with a meal. Take 2 tabs every day on the day after chemo an then take 2 tabs twice a day with food for 2 days.      . ferrous sulfate 325 (65 FE) MG tablet Take 325 mg by mouth daily with breakfast.      . lidocaine-prilocaine (EMLA) cream Apply 1 application topically as needed (apply to [facility] site 1-2 hours prior to treatment.).      Marland Kitchen LORazepam (ATIVAN) 0.5 MG tablet Take 0.5 mg by mouth every 6 (six) hours as needed for anxiety (as needed for nausea and vomitting).      . ondansetron (ZOFRAN) 8 MG tablet Take 8 mg  by mouth every 8 (eight) hours as needed for nausea (Take 1 pill by mouth twice a day as needed  for nausea and vomitting starting 3rd day after chemo).      Marland Kitchen oxyCODONE-acetaminophen (ROXICET) 5-325 MG per tablet Take 1-2 tablets by mouth every 4 (four) hours as needed for pain.  30 tablet  0  . prochlorperazine (COMPAZINE) 10 MG tablet Take 10 mg by mouth every 6 (six) hours as needed (for nausea and vomitting).      Marland Kitchen UNABLE TO FIND Apply 1 application topically once. 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  1  application  0  . valACYclovir (VALTREX) 500 MG tablet Take 500 mg by mouth daily as needed. For herpes      . hydrochlorothiazide (HYDRODIURIL) 25 MG tablet Take 1 tablet (25 mg total) by mouth daily.  30 tablet  11  . prochlorperazine (COMPAZINE) 25 MG suppository Place 25 mg rectally every 12 (twelve) hours as needed for nausea (as needed for nausea and vomitting).       No current facility-administered medications for this visit.    SURGICAL HISTORY:  Past Surgical History  Procedure Laterality Date  . Tubal ligation  2003    LTL  . Portacath placement N/A 07/06/2012    Procedure: INSERTION PORT-A-CATH;  Surgeon: Almond Lint, MD;  Location: [facility] OR;  Service: General;  Laterality: N/A;    REVIEW OF SYSTEMS:  General: fatigue (-), night sweats (-), fever (-), pain (-) Lymph: palpable nodes (-) HEENT: vision changes (-), mucositis (-), gum bleeding (-), epistaxis (-) Cardiovascular: chest pain (-), palpitations (-) Pulmonary: shortness of breath (-), dyspnea on exertion (-), cough (-), hemoptysis (-) GI:  Early satiety (-), melena (-), dysphagia (-), nausea/vomiting (-), diarrhea (-) GU: dysuria (-), hematuria (-), incontinence (-) Musculoskeletal: joint swelling (-), joint pain (-), back pain (-) Neuro: weakness (-), numbness (-), headache (-), confusion (-) Skin: Rash (-), lesions (-), dryness (-) Psych: depression (-), suicidal/homicidal ideation (-), feeling of hopelessness (-)   HEALTH MAINTENANCE:   PHYSICAL EXAMINATION: Blood pressure 122/75, pulse 82, temperature 98.3 F (36.8 C), temperature source Oral, resp. rate 20, height 5\' 3"  (1.6 m), weight 117 lb 12.8 oz (53.434 kg). Body mass index is 20.87 kg/(m^2). General: Patient is a well appearing female in no acute distress HEENT: PERRLA, sclerae anicteric no conjunctival pallor, MMM Neck: supple, no palpable adenopathy Lungs: clear to auscultation bilaterally, no wheezes, rhonchi, or rales Cardiovascular: regular rate  rhythm, S1, S2, no murmurs, rubs or gallops Abdomen: Soft, non-tender, non-distended, normoactive bowel sounds, no HSM Extremities: warm and well perfused, no clubbing, cyanosis, or edema Skin: No rashes or lesions Neuro: Non-focal Breast examination: Left breast no masses or nipple discharge Right breast reveals 3 discrete masses at the 3:00 position 2:00 position.  It is nontender there is no nipple discharge. Conglomeration of these masses to my exam measure about 2-3cm. ECOG PERFORMANCE STATUS: 0 - Asymptomatic      LABORATORY DATA: Lab Results  Component Value Date   WBC 9.4 08/14/2012   HGB 9.7* 08/14/2012   HCT 28.0* 08/14/2012   MCV 90.9 08/14/2012   PLT 265 08/14/2012      Chemistry      Component Value Date/Time   NA 136 08/07/2012 1157   NA 137 06/06/2012 0853   K 3.7 08/07/2012 1157   K 3.8 06/06/2012 0853   CL 104 07/17/2012 1357   CL 102 06/06/2012 0853  CO2 28 08/07/2012 1157   CO2 26 06/06/2012 0853   BUN 13.9 08/07/2012 1157   BUN 12 06/06/2012 0853   CREATININE 0.7 08/07/2012 1157   CREATININE 0.70 06/06/2012 0853   CREATININE 0.85 04/24/2009 1114      Component Value Date/Time   CALCIUM 9.4 08/07/2012 1157   CALCIUM 9.3 06/06/2012 0853   ALKPHOS 73 08/07/2012 1157   ALKPHOS 35* 06/06/2012 0853   AST 14 08/07/2012 1157   AST 14 06/06/2012 0853   ALT 13 08/07/2012 1157   ALT 9 06/06/2012 0853   BILITOT 0.45 08/07/2012 1157   BILITOT 0.8 06/06/2012 0853     ADDITIONAL INFORMATION: PROGNOSTIC INDICATORS - ACIS Results: IMMUNOHISTOCHEMICAL AND MORPHOMETRIC ANALYSIS BY THE AUTOMATED CELLULAR IMAGING SYSTEM (ACIS) Estrogen Receptor: 0%, NEGATIVE Progesterone Receptor: 0%, NEGATIVE Proliferation Marker Ki67: 95% COMMENT: The negative hormone receptor study(ies) in this case have an internal positive control. REFERENCE RANGE ESTROGEN RECEPTOR NEGATIVE <1% POSITIVE =>1% PROGESTERONE RECEPTOR NEGATIVE <1% POSITIVE =>1% All controls stained appropriately Pecola Leisure  MD Pathologist, Electronic Signature ( Signed 06/28/2012) CHROMOGENIC IN-SITU HYBRIDIZATION Results: HER-2/NEU BY CISH - NO AMPLIFICATION OF HER-2 DETECTED. RESULT RATIO OF HER2: CEP 17 SIGNALS 1.26 1 of 3 Duplicate copy FINAL for JOELENE, BARRIERE [phone]) ADDITIONAL INFORMATION:(continued) AVERAGE HER2 COPY NUMBER PER CELL 2.45 REFERENCE RANGE NEGATIVE HER2/Chr17 Ratio <2.0 and Average HER2 copy number <4.0 EQUIVOCAL HER2/Chr17 Ratio <2.0 and Average HER2 copy number 4.0 and <6.0 POSITIVE HER2/Chr17 Ratio >=2.0 and/or Average HER2 copy number >=6.0 Jimmy Picket MD Pathologist, Electronic Signature ( Signed 06/26/2012) FINAL DIAGNOSIS Diagnosis Breast, right, needle core biopsy, 3:00 position - INVASIVE DUCTAL CARCINOMA SEE COMMENT. - DUCTAL CARCINOMA IN SITU Microscopic Comment Although the grade of tumor is best assessed at resection, with these biopsies, both the invasive and in situ carcinoma are grade III. Breast prognostic studies are pending and will be reported in an addendum. The case is reviewed with Dr. Raynald Blend who concurs. (CRR:gt, 06/21/12) [place] RUND DO Pathologist, Electronic FINAL DIAGNOSIS Diagnosis Breast, right, needle core biopsy, mass - BENIGN BREAST TISSUE WITH FOCAL PSEUDOANGIOMATOUS STROMAL HYPERPLASIA (PASH). NO EVIDENCE OF MALIGNANCY. Microscopic Comment Called to the [facility] on 07/13/12. (JDP:caf 07/13/12)  RADIOGRAPHIC STUDIES:  Dg Chest 2 View  07/04/2012   *RADIOLOGY REPORT*  Clinical Data: Insertion of Port-A-Cath.  Breast cancer. Hypertension.  CHEST - 2 VIEW  Comparison: CT of the chest on 07/03/2012  Findings: Cardiomediastinal silhouette is within normal limits. The lungs are free of focal consolidations and pleural effusions. Bony structures have a normal appearance.  Contrast is identified within the colon following CT of the abdomen.  IMPRESSION: Negative exam.   Original Report Authenticated By: Norva Pavlov, M.D.    Ct Chest W Contrast  07/03/2012   *RADIOLOGY REPORT*  Clinical Data:  Breast cancer.  No therapy yet.  Lower inner quadrant of right breast.  CT CHEST, ABDOMEN AND PELVIS WITH CONTRAST  Technique: Contiguous axial images of the chest abdomen and pelvis were obtained after IV contrast administration.  Contrast: 80 ml Omnipaque-300  Comparison: Breast MR of 06/26/2012.  No prior CTs.  CT CHEST  Findings: Lung windows demonstrate minimal left lower lobe subpleural nodularity at 3 mm on image 40/series 4.  Right lung clear.  Soft tissue windows demonstrate small bilateral axillary nodes, without adenopathy.  No subpectoral adenopathy. No supraclavicular adenopathy.  Normal heart size, without pericardial effusion.  No central pulmonary embolism, on this non-dedicated study.  No mediastinal or hilar adenopathy.  No internal mammary adenopathy. Minimal residual thymic tissue in the anterior mediastinum (image 17/series 2).  IMPRESSION:  1. No acute process or evidence of metastatic disease in the chest. 2.  Probable subpleural lymph node in the left lower lobe. Recommend attention on follow-up.  CT ABDOMEN AND PELVIS  Findings:  Mild early phase of hepatic enhancement which decreases sensitivity for focal liver lesions.  None identified.  Normal spleen, stomach, pancreas, gallbladder, biliary tract, adrenal glands, kidneys. No retroperitoneal or retrocrural adenopathy.  Normal colon, appendix, and terminal ileum.  Normal small bowel without abdominal ascites.  No pelvic adenopathy.    Normal urinary bladder and uterus.  No adnexal mass.  Trace free pelvic fluid is likely physiologic.  Transitional right-sided lumbosacral anatomy.  Degenerative disc disease at the lumbosacral junction.  IMPRESSION:  No acute process or evidence of metastatic disease in the abdomen or pelvis.   Original Report Authenticated By: Jeronimo Greaves, M.D.   US Breast Right  06/29/2012   *RADIOLOGY REPORT*  Clinical Data:  52 year old female  with newly diagnosed right breast cancer.  Suspicious abnormal enhancement anterior and superior to the biopsy-proven neoplasm - for second look ultrasound evaluation.  RIGHT BREAST ULTRASOUND  Comparison:  Prior mammograms, ultrasounds and MRI.  Findings: Ultrasound is performed, showing the biopsy-proven neoplasm at the 3 o'clock position of the right breast 4 cm from the nipple. No other discrete sonographic abnormalities are identified, specifically medial, superior, and anterior to the biopsy-proven neoplasm.  IMPRESSION: Patchy abnormal enhancement medial superior to the biopsy-proven neoplasm identified on recent MRI is not visualized sonographically as a discrete abnormality.  MR guided biopsy is recommended if breast conservation surgery is considered.  BI-RADS CATEGORY 6:  Known biopsy-proven malignancy - appropriate action should be taken.  RECOMMENDATION: MR guided right breast biopsy, which will be scheduled by our office.  I have discussed the findings and recommendations with the patient. Results were also provided in writing at the conclusion of the visit.  If applicable, a reminder letter will be sent to the patient regarding the next appointment.   Original Report Authenticated By: Harmon Pier, M.D.   US Breast Right  06/20/2012   *RADIOLOGY REPORT*  Clinical Data:  52 year old female for annual bilateral mammograms and palpable mass in the upper and inner right breast discovered on clinical examination.  DIGITAL DIAGNOSTIC BILATERAL MAMMOGRAM WITH CAD AND RIGHT BREAST ULTRASOUND:  Comparison:  03/21/2009 and prior mammograms dating back to 01/21/2005  Findings:  ACR Breast Density Category 4: The breast tissue is extremely dense.  Routine and spot compression views of the right breast demonstrate no evidence of suspicious mass or distortion. Scattered punctate calcifications within the breasts bilaterally are again identified.  Mammographic images were processed with CAD.  On physical exam,  thickening is identified at the 12 o'clock position of the right breast 4 cm from the nipple. A firm palpable mass identified at the 3 o'clock position of the right breast 4 cm from the nipple.  Ultrasound is performed, showing no evidence of solid or cystic mass, distortion or abnormal areas of shadowing at the 12 o'clock position of the right breast. A 7 x 6 x 17 mm slightly irregular oval hypoechoic mass with calcifications is horizontally oriented at the 3 o'clock position of the right breast 4 cm from the nipple.  IMPRESSION: Indeterminate 7 x 6 x 17 mm hypoechoic mass at the 3 o'clock position of the right breast.  Tissue sampling is recommended.  No mammographic evidence of breast  malignancy bilaterally.  No mammographic, palpable or sonographic abnormality at the 12 o'clock position of the right breast, in the area of palpable concern.  BI-RADS CATEGORY 4:  Suspicious abnormality - biopsy should be considered.  RECOMMENDATION:  Ultrasound guided right breast biopsy, which will be performed today but dictated in a separate report.  I have discussed the findings and recommendations with the patient. Results were also provided in writing at the conclusion of the visit.  If applicable, a reminder letter will be sent to the patient regarding the next appointment.   Original Report Authenticated By: Harmon Pier, M.D.   Ct Abdomen Pelvis W Contrast  07/03/2012   *RADIOLOGY REPORT*  Clinical Data:  Breast cancer.  No therapy yet.  Lower inner quadrant of right breast.  CT CHEST, ABDOMEN AND PELVIS WITH CONTRAST  Technique: Contiguous axial images of the chest abdomen and pelvis were obtained after IV contrast administration.  Contrast: 80 ml Omnipaque-300  Comparison: Breast MR of 06/26/2012.  No prior CTs.  CT CHEST  Findings: Lung windows demonstrate minimal left lower lobe subpleural nodularity at 3 mm on image 40/series 4.  Right lung clear.  Soft tissue windows demonstrate small bilateral axillary nodes,  without adenopathy.  No subpectoral adenopathy. No supraclavicular adenopathy.  Normal heart size, without pericardial effusion.  No central pulmonary embolism, on this non-dedicated study.  No mediastinal or hilar adenopathy.  No internal mammary adenopathy. Minimal residual thymic tissue in the anterior mediastinum (image 17/series 2).  IMPRESSION:  1. No acute process or evidence of metastatic disease in the chest. 2.  Probable subpleural lymph node in the left lower lobe. Recommend attention on follow-up.  CT ABDOMEN AND PELVIS  Findings:  Mild early phase of hepatic enhancement which decreases sensitivity for focal liver lesions.  None identified.  Normal spleen, stomach, pancreas, gallbladder, biliary tract, adrenal glands, kidneys. No retroperitoneal or retrocrural adenopathy.  Normal colon, appendix, and terminal ileum.  Normal small bowel without abdominal ascites.  No pelvic adenopathy.    Normal urinary bladder and uterus.  No adnexal mass.  Trace free pelvic fluid is likely physiologic.  Transitional right-sided lumbosacral anatomy.  Degenerative disc disease at the lumbosacral junction.  IMPRESSION:  No acute process or evidence of metastatic disease in the abdomen or pelvis.   Original Report Authenticated By: Jeronimo Greaves, M.D.   Mr Breast Right W Wo Contrast  07/12/2012   *RADIOLOGY REPORT*  Clinical Data:  Known invasive mammary carcinoma in the right breast at 3 o'clock posteriorly.  Additional site of abnormal enhancement in the right upper inner quadrant closer to the nipple for possible biopsy.  ATTEMPTED MRI GUIDED VACUUM ASSISTED BIOPSY OF THE RIGHT BREAST WITHOUT AND WITH CONTRAST  Comparison: Previous exams.  Technique: Multiplanar, multisequence MR images of the right breast were obtained prior to and following the intravenous administration of 9 ml of Mulithance.  I met with the patient, and we discussed the procedure of MRI guided biopsy, including risks, benefits, and alternatives.  Specifically, we discussed the risks of infection, bleeding, tissue injury, clip migration, and inadequate sampling.  Informed, written consent was given.  Using sterile technique, 2% Lidocaine, MRI guidance, and a 9 gauge vacuum assisted device, biopsy was attempted using a mediolateral approach.  However, the procedure was unsuccessful due to the extremely thin breast compression.  IMPRESSION:  Unsuccessful MRI guided vacuum-assisted biopsy of the right breast.  Patient was sent to the [facility] for further evaluation with ultrasound.   Original Report Authenticated  By: Cain Saupe, M.D.   Mr Breast Bilateral W Wo Contrast  06/26/2012   *RADIOLOGY REPORT*  Clinical Data: History of new diagnosis of breast cancer from recent ultrasound guided core needle biopsy right breast 06/20/2012 demonstrating invasive ductal carcinoma with DCIS.  GFR 89, creatinine 0.7 on 06/06/2012.  BILATERAL BREAST MRI WITH AND WITHOUT CONTRAST  Technique: Multiplanar, multisequence MR images of both breasts were obtained prior to and following the intravenous administration of 10ml of multihance.  Three dimensional images were evaluated at the independent DynaCad workstation.  Comparison:  Recent mammograms and ultrasound.  Findings: Examination demonstrates mild background parenchymal enhancement.  Right breast:  There is evidence of an ovoid gently lobulated and somewhat ill-defined heterogeneously enhancing mass over the deep third of the inner midportion of the right breast representing patient's biopsy-proven malignancy.  This mass contains central clip artifact and measures approximately 0.9 x 1.9 x 1.0 cm. The anterior border of this mass is located 5.5 cm from the nipple. There is a 6 mm slightly ill-defined enhancing ovoid mass 6 mm posterior lateral to the biopsy-proven malignancy lying just superficial to the pectoralis muscle.  There is also clumped non mass enhancement anterior and slightly superior to the biopsy-  proven malignancy in the middle third at approximately the 2 o'clock position.  This measures approximately 0.8 x 1.6 x 1.2 cm and is located 3 cm from the nipple.  This is suspicious for multifocal disease.  No suspicious axillary or internal mammary lymph nodes.  Left breast:  No suspicious mass, enhancement or adenopathy.  IMPRESSION: Known biopsy-proven malignancy over the deep third of the inner mid right breast.  6 mm ill-defined enhancing ovoid mass 6 mm posterior lateral to the biopsy-proven malignancy and lying just superficial to the pectoralis muscle.  Clumped non mass enhancement anterior and superior to patient's known malignancy at approximately the 2 o'clock position measuring 0.8 x 1.6 x 2.2 cm.  Findings likely represent multi focal disease.  RECOMMENDATION: Recommend second look ultrasound and core biopsy if feasible of the clumped non mass enhancement at approximately the 2 o'clock position right breast anterior/superior to the known malignancy. If not seen by ultrasound, recommend proceeding to MR guided biopsy of this suspicious abnormality.  THREE-DIMENSIONAL MR IMAGE RENDERING ON INDEPENDENT WORKSTATION:  Three-dimensional MR images were rendered by post-processing of the original MR data on an independent workstation.  The three- dimensional MR images were interpreted, and findings were reported in the accompanying complete MRI report for this study.  BI-RADS CATEGORY 0:  Incomplete.  Need additional imaging evaluation and/or prior mammograms for comparison.   Original Report Authenticated By: Elberta Fortis, M.D.   Dg Chest Port 1 View  07/06/2012   *RADIOLOGY REPORT*  Clinical Data: Post left-sided port catheter insertion  PORTABLE CHEST - 1 VIEW  Comparison: 07/04/2012  Findings:  Grossly unchanged cardiac silhouette and mediastinal contours. Interval placement of a left anterior chest wall subclavian vein approach port-a-catheter with tip projecting over the superior cavoatrial junction.  Grossly unchanged symmetric biapical pleural parenchymal thickening.  No pneumothorax.  No focal airspace opacities.  No pleural effusion.  No evidence of edema.  Unchanged bones.  IMPRESSION: Interval placement of a left anterior chest wall subclavian vein approach port-a-catheter with tip overlying the superior cavoatrial junction.  No pneumothorax.   Original Report Authenticated By: Tacey Ruiz, MD   Mm Digital Diagnostic Bilat  06/20/2012   *RADIOLOGY REPORT*  Clinical Data:  52 year old female for annual bilateral mammograms and palpable mass  in the upper and inner right breast discovered on clinical examination.  DIGITAL DIAGNOSTIC BILATERAL MAMMOGRAM WITH CAD AND RIGHT BREAST ULTRASOUND:  Comparison:  03/21/2009 and prior mammograms dating back to 01/21/2005  Findings:  ACR Breast Density Category 4: The breast tissue is extremely dense.  Routine and spot compression views of the right breast demonstrate no evidence of suspicious mass or distortion. Scattered punctate calcifications within the breasts bilaterally are again identified.  Mammographic images were processed with CAD.  On physical exam, thickening is identified at the 12 o'clock position of the right breast 4 cm from the nipple. A firm palpable mass identified at the 3 o'clock position of the right breast 4 cm from the nipple.  Ultrasound is performed, showing no evidence of solid or cystic mass, distortion or abnormal areas of shadowing at the 12 o'clock position of the right breast. A 7 x 6 x 17 mm slightly irregular oval hypoechoic mass with calcifications is horizontally oriented at the 3 o'clock position of the right breast 4 cm from the nipple.  IMPRESSION: Indeterminate 7 x 6 x 17 mm hypoechoic mass at the 3 o'clock position of the right breast.  Tissue sampling is recommended.  No mammographic evidence of breast malignancy bilaterally.  No mammographic, palpable or sonographic abnormality at the 12 o'clock position of the right  breast, in the area of palpable concern.  BI-RADS CATEGORY 4:  Suspicious abnormality - biopsy should be considered.  RECOMMENDATION:  Ultrasound guided right breast biopsy, which will be performed today but dictated in a separate report.  I have discussed the findings and recommendations with the patient. Results were also provided in writing at the conclusion of the visit.  If applicable, a reminder letter will be sent to the patient regarding the next appointment.   Original Report Authenticated By: Harmon Pier, M.D.   Mm Digital Diagnostic Unilat R  07/12/2012   *RADIOLOGY REPORT*  Clinical Data:  Ultrasound-guided core needle biopsy of a hypoechoic area at 2 o'clock 2 cm from the right nipple with clip placement.  Known invasive mammary carcinoma in the right breast. Unsuccessful right breast MRI guided biopsy earlier today.  DIGITAL DIAGNOSTIC RIGHT MAMMOGRAM  Comparison:  Previous exams.  Findings:  Films are performed following ultrasound guided biopsy of a small hypoechoic area at 2 o'clock 2 cm from the right nipple. The ribbon clip is positioned within the area of concern. The ribbon clip is approximately 2.6 cm anterior to the top hat clip from the original biopsy on 06/20/2012.  IMPRESSION: Appropriate clip placement following ultrasound-guided core needle biopsy of a hypoechoic area at 2 o'clock 2 cm in the right nipple.   Original Report Authenticated By: Cain Saupe, M.D.   Mm Digital Diagnostic Unilat R  06/20/2012   *RADIOLOGY REPORT*  Clinical Data:  52 year old female - evaluate clip placement following ultrasound guided right breast biopsy.  DIGITAL DIAGNOSTIC RIGHT MAMMOGRAM  Comparison:  Previous exams.  Findings:  Films are performed following ultrasound guided biopsy of the 7 x 6 x 17 mm hypoechoic mass at the 3 o'clock position of the right breast 4 cm from the nipple. The T shaped biopsy clip is in satisfactory position.  IMPRESSION: Satisfactory clip placement following  ultrasound guided right breast biopsy.  Pathology will be followed.   Original Report Authenticated By: Harmon Pier, M.D.   Dg Fluoro Guide Cv Line-no Report  07/06/2012   CLINICAL DATA: port placement   FLOURO GUIDE CV LINE  Fluoroscopy was utilized by the  requesting physician.  No radiographic  interpretation.    [place] Rt Breast Bx W Loc Dev 1st Lesion Img Bx Spec US Guide  07/13/2012   **ADDENDUM** CREATED: 07/13/2012 14:11:45  I spoke with the patient by telephone on 07/13/2012 to discuss pathology results.  Pathology demonstrates  pseudoangiomatous stromal hyperplasia.  The findings were called to the office of Dr. Donell Beers. Although the imaging findings could be related to pseudoangiomatous stromal hyperplasia, excision of the anterior biopsy site is suggested at the time of patient's definitive surgery.  The patient is considering unilateral or bilateral mastectomy at this time.  The patient reports no problems at the biopsy site.  All questions were answered.  Recommendations:  Excision of the biopsy site. The patient is scheduled to begin chemotherapy in 4 days.  **END ADDENDUM** SIGNED BY: Blair Hailey. Manson Passey, M.D.  07/12/2012   *RADIOLOGY REPORT*  Clinical Data:  Unsuccessful MRI guided biopsy of the area of abnormal enhancement in the left anterior upper inner quadrant, anterior to the known invasive mammary carcinoma.  Second look ultrasound performed and a 5 x 4 x 4 mm hypoechoic area found at 2 o'clock 2 cm from the right nipple which will be biopsied.  ULTRASOUND GUIDED VACUUM ASSISTED CORE BIOPSY OF THE RIGHT BREAST  Sonography of the right upper inner quadrant was performed.  There is an ill-defined hypoechoic 5 x 4 x 4 mm area at 2 o'clock 2 cm from the right nipple which will be biopsied with ultrasound guidance.  The patient and I discussed the procedure of ultrasound-guided biopsy, including benefits and alternatives.  We discussed the high likelihood of a successful procedure. We discussed the  risks of the procedure including infection, bleeding, tissue injury, clip migration, and inadequate sampling.  Written informed consent was given.  Using sterile technique, 2% lidocaine, ultrasound guidance, and a 12 gauge vacuum assisted needle, biopsy was performed of the hypoechoic area at 2 o'clock 2 cm from the right nipple using a caudocranial approach.  At the conclusion of the procedure, a within tissue marker clip was deployed into the biopsy cavity. Follow-up 2-view mammogram was performed and dictated separately.  IMPRESSION: Ultrasound-guided biopsy of a 5 x 4 x 4 mm hypoechoic area at 2 o'clock 2 cm from the right nipple.  No apparent complications.   Original Report Authenticated By: Cain Saupe, M.D.   [place] Rt Breast Bx W Loc Dev 1st Lesion Img Bx Spec US Guide  06/21/2012   *RADIOLOGY REPORT*  Clinical Data:  Indeterminate 7 x 6 x 17 mm hypoechoic mass in the inner right breast - for tissue sampling.  ULTRASOUND GUIDED VACUUM ASSISTED CORE BIOPSY OF THE RIGHT BREAST  Comparison: Previous exams.  I met with the patient and we discussed the procedure of ultrasound- guided biopsy, including benefits and alternatives.  We discussed the high likelihood of a successful procedure. We discussed the risks of the procedure including infection, bleeding, tissue injury, clip migration, and inadequate sampling.  Informed written consent was given.  Using sterile technique, 2% lidocaine ultrasound guidance and a 12 gauge vacuum assisted needle biopsy was performed of the 7 x 6 x 17 mm hypoechoic mass at the 3 o'clock position of the right breast 4 cm from the nipple using a lateral approach.  At the conclusion of the procedure, a T shaped tissue marker clip was deployed into the biopsy cavity.  Follow-up 2-view mammogram was performed and dictated separately.  IMPRESSION: Ultrasound-guided biopsy of right breast mass.  No apparent complications.  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 Histology correlates with imaging findings.  The patient was contacted by phone on 06/21/2012 and these results given to her which she understood. Her questions were answered. She was encouraged to obtain breast cancer educational material from [facility] at her convenience. The patient had no complaints with her biopsy site.  Recommend surgery/oncology consultation.  An appointment at the [facility] has been scheduled for 06/28/2012. Recommend bilateral breast MRI, which has been scheduled for 06/26/2012. The patient was informed of these appointments.   Original Report Authenticated By: Harmon Pier, M.D.    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 Patient had other areas seen on MRI biopsy of the second mass was negative. Patient has had her Port-A-Cath placed. She is proceeding neoadjuvant chemotherapy to possibly conserve her breast. Chemotherapy will consist of Adriamycin Cytoxan given dose dense for 4 cycles starting today 07/17/2012. She will receive Neulasta support on day 2 for each cycle. When she completes this then she will begin weekly Taxol and carboplatinum for a total of 12 weeks.  #2 we discussed risks benefits and side effects of chemotherapy. She has had an echocardiogram as well as chemotherapy teaching class. Port-A-Cath was placed by Dr. Donell Beers. The port is functioning well.    PLAN:  #1 patient will proceed with cycle #3 of Adriamycin and Cytoxan today.  #2 she will return tomorrow for Neulasta injection.  #3 she'll be seen back in one week's time for followup.  All questions were answered. The patient knows to call the clinic with any problems, questions or concerns. We can certainly see the patient much sooner if necessary.  I spent 25 minutes counseling the patient face to face. The total time spent in the appointment was 30 minutes.   Drue Second, MD Medical/Oncology [facility] [phone] (beeper) [phone] (Office)  08/14/2012, 2:45 PM

## 2012-08-14 NOTE — Progress Notes (Signed)
Patient asked not to receive the lorazepam.  Reports with first treatment she was hallucinating.  Denies any problems with oral lorazepam at home for nausea.  Reports she takes this at night.  Sleeps well and not aware of any hallucinations.

## 2012-08-14 NOTE — Patient Instructions (Addendum)
Proceed with cycle 3 of AC  You will see Jaime Murillo on 7/28

## 2012-08-15 ENCOUNTER — Ambulatory Visit (HOSPITAL_BASED_OUTPATIENT_CLINIC_OR_DEPARTMENT_OTHER): Payer: Medicaid Other

## 2012-08-15 VITALS — BP 127/87 | HR 73 | Temp 98.3°F

## 2012-08-15 DIAGNOSIS — C50319 Malignant neoplasm of lower-inner quadrant of unspecified female breast: Secondary | ICD-10-CM

## 2012-08-15 DIAGNOSIS — C50311 Malignant neoplasm of lower-inner quadrant of right female breast: Secondary | ICD-10-CM

## 2012-08-15 DIAGNOSIS — Z5189 Encounter for other specified aftercare: Secondary | ICD-10-CM

## 2012-08-15 MED ORDER — PEGFILGRASTIM INJECTION 6 MG/0.6ML
6.0000 mg | Freq: Once | SUBCUTANEOUS | Status: AC
  Administered 2012-08-15: 6 mg via SUBCUTANEOUS
  Filled 2012-08-15: qty 0.6

## 2012-08-16 ENCOUNTER — Telehealth: Payer: Self-pay | Admitting: Genetic Counselor

## 2012-08-16 ENCOUNTER — Encounter: Payer: Self-pay | Admitting: Genetic Counselor

## 2012-08-16 NOTE — Telephone Encounter (Signed)
Revealed negative breast/ovarian cancer panel with two likely benign variants and one VUS.

## 2012-08-16 NOTE — Telephone Encounter (Signed)
Left good news message on VM. 

## 2012-08-21 ENCOUNTER — Other Ambulatory Visit (HOSPITAL_BASED_OUTPATIENT_CLINIC_OR_DEPARTMENT_OTHER): Payer: Medicaid Other | Admitting: Lab

## 2012-08-21 ENCOUNTER — Telehealth: Payer: Self-pay | Admitting: Oncology

## 2012-08-21 ENCOUNTER — Ambulatory Visit (HOSPITAL_BASED_OUTPATIENT_CLINIC_OR_DEPARTMENT_OTHER): Payer: Medicaid Other | Admitting: Adult Health

## 2012-08-21 ENCOUNTER — Other Ambulatory Visit: Payer: Managed Care, Other (non HMO) | Admitting: Lab

## 2012-08-21 ENCOUNTER — Encounter: Payer: Self-pay | Admitting: Adult Health

## 2012-08-21 ENCOUNTER — Ambulatory Visit: Payer: Managed Care, Other (non HMO) | Admitting: Oncology

## 2012-08-21 VITALS — BP 116/74 | HR 88 | Temp 98.3°F | Resp 20 | Ht 63.0 in | Wt 116.7 lb

## 2012-08-21 DIAGNOSIS — C50319 Malignant neoplasm of lower-inner quadrant of unspecified female breast: Secondary | ICD-10-CM

## 2012-08-21 DIAGNOSIS — C50311 Malignant neoplasm of lower-inner quadrant of right female breast: Secondary | ICD-10-CM

## 2012-08-21 LAB — COMPREHENSIVE METABOLIC PANEL (CC13)
AST: 11 U/L (ref 5–34)
Albumin: 3.3 g/dL — ABNORMAL LOW (ref 3.5–5.0)
BUN: 16.5 mg/dL (ref 7.0–26.0)
CO2: 28 mEq/L (ref 22–29)
Calcium: 9.3 mg/dL (ref 8.4–10.4)
Chloride: 105 mEq/L (ref 98–109)
Creatinine: 0.7 mg/dL (ref 0.6–1.1)
Glucose: 92 mg/dl (ref 70–140)
Potassium: 3.2 mEq/L — ABNORMAL LOW (ref 3.5–5.1)

## 2012-08-21 LAB — CBC WITH DIFFERENTIAL/PLATELET
BASO%: 2.3 % — ABNORMAL HIGH (ref 0.0–2.0)
EOS%: 0.8 % (ref 0.0–7.0)
HGB: 8.4 g/dL — ABNORMAL LOW (ref 11.6–15.9)
MCH: 31 pg (ref 25.1–34.0)
MCHC: 33.9 g/dL (ref 31.5–36.0)
RDW: 12.9 % (ref 11.2–14.5)
WBC: 1.3 10*3/uL — ABNORMAL LOW (ref 3.9–10.3)
lymph#: 0.8 10*3/uL — ABNORMAL LOW (ref 0.9–3.3)

## 2012-08-21 NOTE — Progress Notes (Signed)
OFFICE PROGRESS NOTE  CC  [name], MD [address] [place] [address] Dr. Everardo Beals Dr. 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 Patient had a palpable mass at the 3:00 position. She was seen at the breast Center on 06/20/2012 she had a mammogram and ultrasound showing a 1.7 cm mass at the 3:00 position. 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 Tumor was grade 3.  #2 MRIs performed on 06/26/2012 showed no biopsy mass at 3:00 but also 2 other adjacent masses at 1 at 2:00 measuring 0.8 x 1.6 x 2.2 cm andalso 6 mm ill-defined mass posterior to the biopsy-proven malignancy.  #3 patient has gone on to have a Port-A-Cath placed to begin neoadjuvant chemotherapy initially consisting of Adriamycin Cytoxan to be given dose dense for 4 cycles followed by weekly Taxol and carboplatinum for a total of 12 weeks.carefully agent made to have an excellent response to neoadjuvant chemotherapy and could possibly have breast preservation of course she may even need chest mastectomy with sentinel lymph node biopsies. Patient and I had met at the [facility] clinic we discussed the rationale for upfront chemotherapy she is now here to receive that  CURRENT THERAPY: cycle 3 day 8 of Adriamycin and Cytoxan  INTERVAL HISTORY: Jaime Murillo 52 y.o. female returns for evaluation following her third cycle of Adriamycin/Cytoxan. She is doing very well today.  She denies fevers, chills, nausea, vomiting, constipation, diarrhea, numbness, or any further concerns.  A 10 point ROS is negative.    MEDICAL HISTORY: Past Medical History  Diagnosis Date  . HSV infection   . NSVD (normal  spontaneous vaginal delivery)   . Hypertension     takes HCTZ  . Urinary frequency   . Anemia     takes Ferrous Sulfate occasionally  . Breast cancer 2014    triple negative    ALLERGIES:  has No Known Allergies.  MEDICATIONS:  Current Outpatient Prescriptions  Medication Sig Dispense Refill  . ciprofloxacin (CIPRO) 500 MG tablet Take 1 tablet (500 mg total) by mouth 2 (two) times daily.  14 tablet  5  . dexamethasone (DECADRON) 4 MG tablet Take 4 mg by mouth 2 (two) times daily with a meal. Take 2 tabs every day on the day after chemo an then take 2 tabs twice a day with food for 2 days.      . ferrous sulfate 325 (65 FE) MG tablet Take 325 mg by mouth daily with breakfast.      . hydrochlorothiazide (HYDRODIURIL) 25 MG tablet Take 1 tablet (25 mg total) by mouth daily.  30 tablet  11  . lidocaine-prilocaine (EMLA) cream Apply 1 application topically as needed (apply to PAC site 1-2 hours prior to treatment.).      Marland Kitchen LORazepam (ATIVAN) 0.5 MG tablet Take 0.5 mg by mouth every 6 (six) hours as needed for anxiety (as needed for nausea and vomitting).      . ondansetron (ZOFRAN) 8 MG tablet Take 8 mg by mouth every 8 (eight) hours as needed for nausea (Take 1 pill by mouth twice a day as needed  for nausea and vomitting starting 3rd day after chemo).      Marland Kitchen oxyCODONE-acetaminophen (ROXICET)  5-325 MG per tablet Take 1-2 tablets by mouth every 4 (four) hours as needed for pain.  30 tablet  0  . prochlorperazine (COMPAZINE) 10 MG tablet Take 10 mg by mouth every 6 (six) hours as needed (for nausea and vomitting).      . prochlorperazine (COMPAZINE) 25 MG suppository Place 25 mg rectally every 12 (twelve) hours as needed for nausea (as needed for nausea and vomitting).      Marland Kitchen UNABLE TO FIND Apply 1 application topically once. 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  1 application  0  . valACYclovir (VALTREX) 500 MG tablet Take 500 mg by mouth  daily as needed. For herpes       No current facility-administered medications for this visit.    SURGICAL HISTORY:  Past Surgical History  Procedure Laterality Date  . Tubal ligation  2003    LTL  . Portacath placement N/A 07/06/2012    Procedure: INSERTION PORT-A-CATH;  Surgeon: Almond Lint, MD;  Location: [facility] OR;  Service: General;  Laterality: N/A;    REVIEW OF SYSTEMS:  General: fatigue (-), night sweats (-), fever (-), pain (-) Lymph: palpable nodes (-) HEENT: vision changes (-), mucositis (-), gum bleeding (-), epistaxis (-) Cardiovascular: chest pain (-), palpitations (-) Pulmonary: shortness of breath (-), dyspnea on exertion (-), cough (-), hemoptysis (-) GI:  Early satiety (-), melena (-), dysphagia (-), nausea/vomiting (-), diarrhea (-) GU: dysuria (-), hematuria (-), incontinence (-) Musculoskeletal: joint swelling (-), joint pain (-), back pain (-) Neuro: weakness (-), numbness (-), headache (-), confusion (-) Skin: Rash (-), lesions (-), dryness (-) Psych: depression (-), suicidal/homicidal ideation (-), feeling of hopelessness (-)   PHYSICAL EXAMINATION: Blood pressure 116/74, pulse 88, temperature 98.3 F (36.8 C), temperature source Oral, resp. rate 20, height 5\' 3"  (1.6 m), weight 116 lb 11.2 oz (52.935 kg). Body mass index is 20.68 kg/(m^2). General: Patient is a well appearing female in no acute distress HEENT: PERRLA, sclerae anicteric no conjunctival pallor, MMM Neck: supple, no palpable adenopathy Lungs: clear to auscultation bilaterally, no wheezes, rhonchi, or rales Cardiovascular: regular rate rhythm, S1, S2, no murmurs, rubs or gallops Abdomen: Soft, non-tender, non-distended, normoactive bowel sounds, no HSM Extremities: warm and well perfused, no clubbing, cyanosis, or edema Skin: No rashes or lesions Neuro: Non-focal Breast examination: Left breast no masses or nipple discharge Right breast reveals 3 discrete masses at the 3:00 position 2:00  position.  It is nontender there is no nipple discharge. Conglomeration of these masses to my exam measure about 2-3cm. ECOG PERFORMANCE STATUS: 0 - Asymptomatic      LABORATORY DATA: Lab Results  Component Value Date   WBC 1.3* 08/21/2012   HGB 8.4* 08/21/2012   HCT 24.8* 08/21/2012   MCV 91.5 08/21/2012   PLT 181 08/21/2012      Chemistry      Component Value Date/Time   NA 141 08/21/2012 1226   NA 137 06/06/2012 0853   K 3.2* 08/21/2012 1226   K 3.8 06/06/2012 0853   CL 104 07/17/2012 1357   CL 102 06/06/2012 0853   CO2 28 08/21/2012 1226   CO2 26 06/06/2012 0853   BUN 16.5 08/21/2012 1226   BUN 12 06/06/2012 0853   CREATININE 0.7 08/21/2012 1226   CREATININE 0.70 06/06/2012 0853   CREATININE 0.85 04/24/2009 1114      Component Value Date/Time   CALCIUM 9.3 08/21/2012 1226   CALCIUM 9.3 06/06/2012 0853  ALKPHOS 76 08/21/2012 1226   ALKPHOS 35* 06/06/2012 0853   AST 11 08/21/2012 1226   AST 14 06/06/2012 0853   ALT 7 08/21/2012 1226   ALT 9 06/06/2012 0853   BILITOT 0.30 08/21/2012 1226   BILITOT 0.8 06/06/2012 0853     ADDITIONAL INFORMATION: PROGNOSTIC INDICATORS - ACIS Results: IMMUNOHISTOCHEMICAL AND MORPHOMETRIC ANALYSIS BY THE AUTOMATED CELLULAR IMAGING SYSTEM (ACIS) Estrogen Receptor: 0%, NEGATIVE Progesterone Receptor: 0%, NEGATIVE Proliferation Marker Ki67: 95% COMMENT: The negative hormone receptor study(ies) in this case have an internal positive control. REFERENCE RANGE ESTROGEN RECEPTOR NEGATIVE <1% POSITIVE =>1% PROGESTERONE RECEPTOR NEGATIVE <1% POSITIVE =>1% All controls stained appropriately Pecola Leisure MD Pathologist, Electronic Signature ( Signed 06/28/2012) CHROMOGENIC IN-SITU HYBRIDIZATION Results: HER-2/NEU BY CISH - NO AMPLIFICATION OF HER-2 DETECTED. RESULT RATIO OF HER2: CEP 17 SIGNALS 1.26 1 of 3 Duplicate copy FINAL for ANELLE, [place] [phone]) ADDITIONAL INFORMATION:(continued) AVERAGE HER2 COPY NUMBER PER CELL 2.45 REFERENCE  RANGE NEGATIVE HER2/Chr17 Ratio <2.0 and Average HER2 copy number <4.0 EQUIVOCAL HER2/Chr17 Ratio <2.0 and Average HER2 copy number 4.0 and <6.0 POSITIVE HER2/Chr17 Ratio >=2.0 and/or Average HER2 copy number >=6.0 Jimmy Picket MD Pathologist, Electronic Signature ( Signed 06/26/2012) FINAL DIAGNOSIS Diagnosis Breast, right, needle core biopsy, 3:00 position - INVASIVE DUCTAL CARCINOMA SEE COMMENT. - DUCTAL CARCINOMA IN SITU Microscopic Comment Although the grade of tumor is best assessed at resection, with these biopsies, both the invasive and in situ carcinoma are grade III. Breast prognostic studies are pending and will be reported in an addendum. The case is reviewed with Dr. Raynald Blend who concurs. (CRR:gt, 06/21/12) [place] RUND DO Pathologist, Electronic FINAL DIAGNOSIS Diagnosis Breast, right, needle core biopsy, mass - BENIGN BREAST TISSUE WITH FOCAL PSEUDOANGIOMATOUS STROMAL HYPERPLASIA (PASH). NO EVIDENCE OF MALIGNANCY. Microscopic Comment Called to the [facility] on 07/13/12. (JDP:caf 07/13/12)  RADIOGRAPHIC STUDIES:  Dg Chest 2 View  07/04/2012   *RADIOLOGY REPORT*  Clinical Data: Insertion of Port-A-Cath.  Breast cancer. Hypertension.  CHEST - 2 VIEW  Comparison: CT of the chest on 07/03/2012  Findings: Cardiomediastinal silhouette is within normal limits. The lungs are free of focal consolidations and pleural effusions. Bony structures have a normal appearance.  Contrast is identified within the colon following CT of the abdomen.  IMPRESSION: Negative exam.   Original Report Authenticated By: Norva Pavlov, M.D.   Ct Chest W Contrast  07/03/2012   *RADIOLOGY REPORT*  Clinical Data:  Breast cancer.  No therapy yet.  Lower inner quadrant of right breast.  CT CHEST, ABDOMEN AND PELVIS WITH CONTRAST  Technique: Contiguous axial images of the chest abdomen and pelvis were obtained after IV contrast administration.  Contrast: 80 ml Omnipaque-300  Comparison: Breast  MR of 06/26/2012.  No prior CTs.  CT CHEST  Findings: Lung windows demonstrate minimal left lower lobe subpleural nodularity at 3 mm on image 40/series 4.  Right lung clear.  Soft tissue windows demonstrate small bilateral axillary nodes, without adenopathy.  No subpectoral adenopathy. No supraclavicular adenopathy.  Normal heart size, without pericardial effusion.  No central pulmonary embolism, on this non-dedicated study.  No mediastinal or hilar adenopathy.  No internal mammary adenopathy. Minimal residual thymic tissue in the anterior mediastinum (image 17/series 2).  IMPRESSION:  1. No acute process or evidence of metastatic disease in the chest. 2.  Probable subpleural lymph node in the left lower lobe. Recommend attention on follow-up.  CT ABDOMEN AND PELVIS  Findings:  Mild early phase of hepatic enhancement which decreases sensitivity  for focal liver lesions.  None identified.  Normal spleen, stomach, pancreas, gallbladder, biliary tract, adrenal glands, kidneys. No retroperitoneal or retrocrural adenopathy.  Normal colon, appendix, and terminal ileum.  Normal small bowel without abdominal ascites.  No pelvic adenopathy.    Normal urinary bladder and uterus.  No adnexal mass.  Trace free pelvic fluid is likely physiologic.  Transitional right-sided lumbosacral anatomy.  Degenerative disc disease at the lumbosacral junction.  IMPRESSION:  No acute process or evidence of metastatic disease in the abdomen or pelvis.   Original Report Authenticated By: Jeronimo Greaves, M.D.   US Breast Right  06/29/2012   *RADIOLOGY REPORT*  Clinical Data:  52 year old female with newly diagnosed right breast cancer.  Suspicious abnormal enhancement anterior and superior to the biopsy-proven neoplasm - for second look ultrasound evaluation.  RIGHT BREAST ULTRASOUND  Comparison:  Prior mammograms, ultrasounds and MRI.  Findings: Ultrasound is performed, showing the biopsy-proven neoplasm at the 3 o'clock position of the right  breast 4 cm from the nipple. No other discrete sonographic abnormalities are identified, specifically medial, superior, and anterior to the biopsy-proven neoplasm.  IMPRESSION: Patchy abnormal enhancement medial superior to the biopsy-proven neoplasm identified on recent MRI is not visualized sonographically as a discrete abnormality.  MR guided biopsy is recommended if breast conservation surgery is considered.  BI-RADS CATEGORY 6:  Known biopsy-proven malignancy - appropriate action should be taken.  RECOMMENDATION: MR guided right breast biopsy, which will be scheduled by our office.  I have discussed the findings and recommendations with the patient. Results were also provided in writing at the conclusion of the visit.  If applicable, a reminder letter will be sent to the patient regarding the next appointment.   Original Report Authenticated By: Harmon Pier, M.D.   US Breast Right  06/20/2012   *RADIOLOGY REPORT*  Clinical Data:  52 year old female for annual bilateral mammograms and palpable mass in the upper and inner right breast discovered on clinical examination.  DIGITAL DIAGNOSTIC BILATERAL MAMMOGRAM WITH CAD AND RIGHT BREAST ULTRASOUND:  Comparison:  03/21/2009 and prior mammograms dating back to 01/21/2005  Findings:  ACR Breast Density Category 4: The breast tissue is extremely dense.  Routine and spot compression views of the right breast demonstrate no evidence of suspicious mass or distortion. Scattered punctate calcifications within the breasts bilaterally are again identified.  Mammographic images were processed with CAD.  On physical exam, thickening is identified at the 12 o'clock position of the right breast 4 cm from the nipple. A firm palpable mass identified at the 3 o'clock position of the right breast 4 cm from the nipple.  Ultrasound is performed, showing no evidence of solid or cystic mass, distortion or abnormal areas of shadowing at the 12 o'clock position of the right breast. A 7 x  6 x 17 mm slightly irregular oval hypoechoic mass with calcifications is horizontally oriented at the 3 o'clock position of the right breast 4 cm from the nipple.  IMPRESSION: Indeterminate 7 x 6 x 17 mm hypoechoic mass at the 3 o'clock position of the right breast.  Tissue sampling is recommended.  No mammographic evidence of breast malignancy bilaterally.  No mammographic, palpable or sonographic abnormality at the 12 o'clock position of the right breast, in the area of palpable concern.  BI-RADS CATEGORY 4:  Suspicious abnormality - biopsy should be considered.  RECOMMENDATION:  Ultrasound guided right breast biopsy, which will be performed today but dictated in a separate report.  I have discussed the findings and  recommendations with the patient. Results were also provided in writing at the conclusion of the visit.  If applicable, a reminder letter will be sent to the patient regarding the next appointment.   Original Report Authenticated By: Harmon Pier, M.D.   Ct Abdomen Pelvis W Contrast  07/03/2012   *RADIOLOGY REPORT*  Clinical Data:  Breast cancer.  No therapy yet.  Lower inner quadrant of right breast.  CT CHEST, ABDOMEN AND PELVIS WITH CONTRAST  Technique: Contiguous axial images of the chest abdomen and pelvis were obtained after IV contrast administration.  Contrast: 80 ml Omnipaque-300  Comparison: Breast MR of 06/26/2012.  No prior CTs.  CT CHEST  Findings: Lung windows demonstrate minimal left lower lobe subpleural nodularity at 3 mm on image 40/series 4.  Right lung clear.  Soft tissue windows demonstrate small bilateral axillary nodes, without adenopathy.  No subpectoral adenopathy. No supraclavicular adenopathy.  Normal heart size, without pericardial effusion.  No central pulmonary embolism, on this non-dedicated study.  No mediastinal or hilar adenopathy.  No internal mammary adenopathy. Minimal residual thymic tissue in the anterior mediastinum (image 17/series 2).  IMPRESSION:  1. No acute  process or evidence of metastatic disease in the chest. 2.  Probable subpleural lymph node in the left lower lobe. Recommend attention on follow-up.  CT ABDOMEN AND PELVIS  Findings:  Mild early phase of hepatic enhancement which decreases sensitivity for focal liver lesions.  None identified.  Normal spleen, stomach, pancreas, gallbladder, biliary tract, adrenal glands, kidneys. No retroperitoneal or retrocrural adenopathy.  Normal colon, appendix, and terminal ileum.  Normal small bowel without abdominal ascites.  No pelvic adenopathy.    Normal urinary bladder and uterus.  No adnexal mass.  Trace free pelvic fluid is likely physiologic.  Transitional right-sided lumbosacral anatomy.  Degenerative disc disease at the lumbosacral junction.  IMPRESSION:  No acute process or evidence of metastatic disease in the abdomen or pelvis.   Original Report Authenticated By: Jeronimo Greaves, M.D.   Mr Breast Right W Wo Contrast  07/12/2012   *RADIOLOGY REPORT*  Clinical Data:  Known invasive mammary carcinoma in the right breast at 3 o'clock posteriorly.  Additional site of abnormal enhancement in the right upper inner quadrant closer to the nipple for possible biopsy.  ATTEMPTED MRI GUIDED VACUUM ASSISTED BIOPSY OF THE RIGHT BREAST WITHOUT AND WITH CONTRAST  Comparison: Previous exams.  Technique: Multiplanar, multisequence MR images of the right breast were obtained prior to and following the intravenous administration of 9 ml of Mulithance.  I met with the patient, and we discussed the procedure of MRI guided biopsy, including risks, benefits, and alternatives. Specifically, we discussed the risks of infection, bleeding, tissue injury, clip migration, and inadequate sampling.  Informed, written consent was given.  Using sterile technique, 2% Lidocaine, MRI guidance, and a 9 gauge vacuum assisted device, biopsy was attempted using a mediolateral approach.  However, the procedure was unsuccessful due to the extremely thin  breast compression.  IMPRESSION:  Unsuccessful MRI guided vacuum-assisted biopsy of the right breast.  Patient was sent to the [facility] for further evaluation with ultrasound.   Original Report Authenticated By: Cain Saupe, M.D.   Mr Breast Bilateral W Wo Contrast  06/26/2012   *RADIOLOGY REPORT*  Clinical Data: History of new diagnosis of breast cancer from recent ultrasound guided core needle biopsy right breast 06/20/2012 demonstrating invasive ductal carcinoma with DCIS.  GFR 89, creatinine 0.7 on 06/06/2012.  BILATERAL BREAST MRI WITH AND WITHOUT CONTRAST  Technique: Multiplanar,  multisequence MR images of both breasts were obtained prior to and following the intravenous administration of 10ml of multihance.  Three dimensional images were evaluated at the independent DynaCad workstation.  Comparison:  Recent mammograms and ultrasound.  Findings: Examination demonstrates mild background parenchymal enhancement.  Right breast:  There is evidence of an ovoid gently lobulated and somewhat ill-defined heterogeneously enhancing mass over the deep third of the inner midportion of the right breast representing patient's biopsy-proven malignancy.  This mass contains central clip artifact and measures approximately 0.9 x 1.9 x 1.0 cm. The anterior border of this mass is located 5.5 cm from the nipple. There is a 6 mm slightly ill-defined enhancing ovoid mass 6 mm posterior lateral to the biopsy-proven malignancy lying just superficial to the pectoralis muscle.  There is also clumped non mass enhancement anterior and slightly superior to the biopsy- proven malignancy in the middle third at approximately the 2 o'clock position.  This measures approximately 0.8 x 1.6 x 1.2 cm and is located 3 cm from the nipple.  This is suspicious for multifocal disease.  No suspicious axillary or internal mammary lymph nodes.  Left breast:  No suspicious mass, enhancement or adenopathy.  IMPRESSION: Known biopsy-proven  malignancy over the deep third of the inner mid right breast.  6 mm ill-defined enhancing ovoid mass 6 mm posterior lateral to the biopsy-proven malignancy and lying just superficial to the pectoralis muscle.  Clumped non mass enhancement anterior and superior to patient's known malignancy at approximately the 2 o'clock position measuring 0.8 x 1.6 x 2.2 cm.  Findings likely represent multi focal disease.  RECOMMENDATION: Recommend second look ultrasound and core biopsy if feasible of the clumped non mass enhancement at approximately the 2 o'clock position right breast anterior/superior to the known malignancy. If not seen by ultrasound, recommend proceeding to MR guided biopsy of this suspicious abnormality.  THREE-DIMENSIONAL MR IMAGE RENDERING ON INDEPENDENT WORKSTATION:  Three-dimensional MR images were rendered by post-processing of the original MR data on an independent workstation.  The three- dimensional MR images were interpreted, and findings were reported in the accompanying complete MRI report for this study.  BI-RADS CATEGORY 0:  Incomplete.  Need additional imaging evaluation and/or prior mammograms for comparison.   Original Report Authenticated By: Elberta Fortis, M.D.   Dg Chest Port 1 View  07/06/2012   *RADIOLOGY REPORT*  Clinical Data: Post left-sided port catheter insertion  PORTABLE CHEST - 1 VIEW  Comparison: 07/04/2012  Findings:  Grossly unchanged cardiac silhouette and mediastinal contours. Interval placement of a left anterior chest wall subclavian vein approach port-a-catheter with tip projecting over the superior cavoatrial junction. Grossly unchanged symmetric biapical pleural parenchymal thickening.  No pneumothorax.  No focal airspace opacities.  No pleural effusion.  No evidence of edema.  Unchanged bones.  IMPRESSION: Interval placement of a left anterior chest wall subclavian vein approach port-a-catheter with tip overlying the superior cavoatrial junction.  No pneumothorax.    Original Report Authenticated By: Tacey Ruiz, MD   Mm Digital Diagnostic Bilat  06/20/2012   *RADIOLOGY REPORT*  Clinical Data:  52 year old female for annual bilateral mammograms and palpable mass in the upper and inner right breast discovered on clinical examination.  DIGITAL DIAGNOSTIC BILATERAL MAMMOGRAM WITH CAD AND RIGHT BREAST ULTRASOUND:  Comparison:  03/21/2009 and prior mammograms dating back to 01/21/2005  Findings:  ACR Breast Density Category 4: The breast tissue is extremely dense.  Routine and spot compression views of the right breast demonstrate no evidence of suspicious  mass or distortion. Scattered punctate calcifications within the breasts bilaterally are again identified.  Mammographic images were processed with CAD.  On physical exam, thickening is identified at the 12 o'clock position of the right breast 4 cm from the nipple. A firm palpable mass identified at the 3 o'clock position of the right breast 4 cm from the nipple.  Ultrasound is performed, showing no evidence of solid or cystic mass, distortion or abnormal areas of shadowing at the 12 o'clock position of the right breast. A 7 x 6 x 17 mm slightly irregular oval hypoechoic mass with calcifications is horizontally oriented at the 3 o'clock position of the right breast 4 cm from the nipple.  IMPRESSION: Indeterminate 7 x 6 x 17 mm hypoechoic mass at the 3 o'clock position of the right breast.  Tissue sampling is recommended.  No mammographic evidence of breast malignancy bilaterally.  No mammographic, palpable or sonographic abnormality at the 12 o'clock position of the right breast, in the area of palpable concern.  BI-RADS CATEGORY 4:  Suspicious abnormality - biopsy should be considered.  RECOMMENDATION:  Ultrasound guided right breast biopsy, which will be performed today but dictated in a separate report.  I have discussed the findings and recommendations with the patient. Results were also provided in writing at the  conclusion of the visit.  If applicable, a reminder letter will be sent to the patient regarding the next appointment.   Original Report Authenticated By: Harmon Pier, M.D.   Mm Digital Diagnostic Unilat R  07/12/2012   *RADIOLOGY REPORT*  Clinical Data:  Ultrasound-guided core needle biopsy of a hypoechoic area at 2 o'clock 2 cm from the right nipple with clip placement.  Known invasive mammary carcinoma in the right breast. Unsuccessful right breast MRI guided biopsy earlier today.  DIGITAL DIAGNOSTIC RIGHT MAMMOGRAM  Comparison:  Previous exams.  Findings:  Films are performed following ultrasound guided biopsy of a small hypoechoic area at 2 o'clock 2 cm from the right nipple. The ribbon clip is positioned within the area of concern. The ribbon clip is approximately 2.6 cm anterior to the top hat clip from the original biopsy on 06/20/2012.  IMPRESSION: Appropriate clip placement following ultrasound-guided core needle biopsy of a hypoechoic area at 2 o'clock 2 cm in the right nipple.   Original Report Authenticated By: Cain Saupe, M.D.   Mm Digital Diagnostic Unilat R  06/20/2012   *RADIOLOGY REPORT*  Clinical Data:  52 year old female - evaluate clip placement following ultrasound guided right breast biopsy.  DIGITAL DIAGNOSTIC RIGHT MAMMOGRAM  Comparison:  Previous exams.  Findings:  Films are performed following ultrasound guided biopsy of the 7 x 6 x 17 mm hypoechoic mass at the 3 o'clock position of the right breast 4 cm from the nipple. The T shaped biopsy clip is in satisfactory position.  IMPRESSION: Satisfactory clip placement following ultrasound guided right breast biopsy.  Pathology will be followed.   Original Report Authenticated By: Harmon Pier, M.D.   Dg Fluoro Guide Cv Line-no Report  07/06/2012   CLINICAL DATA: port placement   FLOURO GUIDE CV LINE  Fluoroscopy was utilized by the requesting physician.  No radiographic  interpretation.    [place] Rt Breast Bx W Loc Dev 1st Lesion Img  Bx Spec US Guide  07/13/2012   **ADDENDUM** CREATED: 07/13/2012 14:11:45  I spoke with the patient by telephone on 07/13/2012 to discuss pathology results.  Pathology demonstrates  pseudoangiomatous stromal hyperplasia.  The findings were called to the office of  Dr. Donell Beers. Although the imaging findings could be related to pseudoangiomatous stromal hyperplasia, excision of the anterior biopsy site is suggested at the time of patient's definitive surgery.  The patient is considering unilateral or bilateral mastectomy at this time.  The patient reports no problems at the biopsy site.  All questions were answered.  Recommendations:  Excision of the biopsy site. The patient is scheduled to begin chemotherapy in 4 days.  **END ADDENDUM** SIGNED BY: Blair Hailey. Manson Passey, M.D.  07/12/2012   *RADIOLOGY REPORT*  Clinical Data:  Unsuccessful MRI guided biopsy of the area of abnormal enhancement in the left anterior upper inner quadrant, anterior to the known invasive mammary carcinoma.  Second look ultrasound performed and a 5 x 4 x 4 mm hypoechoic area found at 2 o'clock 2 cm from the right nipple which will be biopsied.  ULTRASOUND GUIDED VACUUM ASSISTED CORE BIOPSY OF THE RIGHT BREAST  Sonography of the right upper inner quadrant was performed.  There is an ill-defined hypoechoic 5 x 4 x 4 mm area at 2 o'clock 2 cm from the right nipple which will be biopsied with ultrasound guidance.  The patient and I discussed the procedure of ultrasound-guided biopsy, including benefits and alternatives.  We discussed the high likelihood of a successful procedure. We discussed the risks of the procedure including infection, bleeding, tissue injury, clip migration, and inadequate sampling.  Written informed consent was given.  Using sterile technique, 2% lidocaine, ultrasound guidance, and a 12 gauge vacuum assisted needle, biopsy was performed of the hypoechoic area at 2 o'clock 2 cm from the right nipple using a caudocranial approach.   At the conclusion of the procedure, a within tissue marker clip was deployed into the biopsy cavity. Follow-up 2-view mammogram was performed and dictated separately.  IMPRESSION: Ultrasound-guided biopsy of a 5 x 4 x 4 mm hypoechoic area at 2 o'clock 2 cm from the right nipple.  No apparent complications.   Original Report Authenticated By: Cain Saupe, M.D.   [place] Rt Breast Bx W Loc Dev 1st Lesion Img Bx Spec US Guide  06/21/2012   *RADIOLOGY REPORT*  Clinical Data:  Indeterminate 7 x 6 x 17 mm hypoechoic mass in the inner right breast - for tissue sampling.  ULTRASOUND GUIDED VACUUM ASSISTED CORE BIOPSY OF THE RIGHT BREAST  Comparison: Previous exams.  I met with the patient and we discussed the procedure of ultrasound- guided biopsy, including benefits and alternatives.  We discussed the high likelihood of a successful procedure. We discussed the risks of the procedure including infection, bleeding, tissue injury, clip migration, and inadequate sampling.  Informed written consent was given.  Using sterile technique, 2% lidocaine ultrasound guidance and a 12 gauge vacuum assisted needle biopsy was performed of the 7 x 6 x 17 mm hypoechoic mass at the 3 o'clock position of the right breast 4 cm from the nipple using a lateral approach.  At the conclusion of the procedure, a T shaped tissue marker clip was deployed into the biopsy cavity.  Follow-up 2-view mammogram was performed and dictated separately.  IMPRESSION: Ultrasound-guided biopsy of right breast mass.  No apparent complications.  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 Histology correlates with imaging findings.  The patient was contacted by phone on 06/21/2012 and these results given to her which she understood. Her questions were answered. She was encouraged to obtain breast cancer educational material from the [facility] at her convenience. The patient had no complaints with her biopsy site.  Recommend  surgery/oncology consultation.  An appointment at the [facility] has been scheduled for 06/28/2012. Recommend bilateral breast MRI, which has been scheduled for 06/26/2012. The patient was informed of these appointments.   Original Report Authenticated By: Harmon Pier, M.D.    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 Patient had other areas seen on MRI biopsy of the second mass was negative. Patient has had her Port-A-Cath placed. She is proceeding neoadjuvant chemotherapy to possibly conserve her breast. Chemotherapy will consist of Adriamycin Cytoxan given dose dense for 4 cycles starting today 07/17/2012. She will receive Neulasta support on day 2 for each cycle. When she completes this then she will begin weekly Taxol and carboplatinum for a total of 12 weeks.  #2 we discussed risks benefits and side effects of chemotherapy. She has had an echocardiogram as well as chemotherapy teaching class. Port-A-Cath was placed by Dr. Donell Beers. The port is functioning well.    PLAN:  #1 Patient is subsequently neutropenic following Adriamycin/Cytoxan. She will have her cipro refilled.  I reviewed neutropenic precautions with her and gave the detailed instructions in her AVS to her.  She is nearing the end of the Adriamycin/Cytoxan, and will schedule her Taxol/Carbo appointments next week.    #2 she'll be seen back in one week's time for followup and her final cycle of Adriamycin/Cytoxan.   All questions were answered. The patient knows to call the clinic with any problems, questions or concerns. We can certainly see the patient much sooner if necessary.  I spent 25 minutes counseling the patient face to face. The total time spent in the appointment was 30 minutes.   Cherie Ouch Lyn Hollingshead, NP Medical Oncology [facility] Phone: [phone]   08/21/2012, 2:55 PM

## 2012-08-21 NOTE — Patient Instructions (Signed)
  Patient Neutropenia Instruction Sheet  Diagnosis: Breast Cancer      Treating Physician: Drue Second, MD  Treatment: 1. Type of chemotherapy: Adriamycin/Cytoxan 2. Date of last treatment: 08/14/12  Last Blood Counts: Lab Results  Component Value Date   WBC 1.3* 08/21/2012   HGB 8.4* 08/21/2012   HCT 24.8* 08/21/2012   MCV 91.5 08/21/2012   PLT 181 08/21/2012  ANC 400      Prophylactic Antibiotics: Cipro 500 mg by mouth twice a day Instructions: 1. Monitor temperature and call if fever  greater than 100.5, chills, shaking chills (rigors) 2. Call Physician on-call at [phone] 3. Give him/her symptoms and list of medications that you are taking and your last blood count.

## 2012-08-21 NOTE — Telephone Encounter (Signed)
Per [facility] 8/4 and 8/11 f/u appts can be shared visits and put on [place] schedule. S/w pt re 8/4 and pt will get schedule when she comes in today.

## 2012-08-22 ENCOUNTER — Ambulatory Visit: Payer: Managed Care, Other (non HMO)

## 2012-08-28 ENCOUNTER — Encounter: Payer: Self-pay | Admitting: Adult Health

## 2012-08-28 ENCOUNTER — Ambulatory Visit (HOSPITAL_BASED_OUTPATIENT_CLINIC_OR_DEPARTMENT_OTHER): Payer: Medicaid Other

## 2012-08-28 ENCOUNTER — Ambulatory Visit (HOSPITAL_BASED_OUTPATIENT_CLINIC_OR_DEPARTMENT_OTHER): Payer: Medicaid Other | Admitting: Adult Health

## 2012-08-28 ENCOUNTER — Other Ambulatory Visit (HOSPITAL_BASED_OUTPATIENT_CLINIC_OR_DEPARTMENT_OTHER): Payer: Medicaid Other | Admitting: Lab

## 2012-08-28 VITALS — BP 119/72 | HR 87 | Temp 98.4°F | Resp 20 | Ht 63.0 in | Wt 119.8 lb

## 2012-08-28 DIAGNOSIS — C50919 Malignant neoplasm of unspecified site of unspecified female breast: Secondary | ICD-10-CM

## 2012-08-28 DIAGNOSIS — C50311 Malignant neoplasm of lower-inner quadrant of right female breast: Secondary | ICD-10-CM

## 2012-08-28 DIAGNOSIS — Z5111 Encounter for antineoplastic chemotherapy: Secondary | ICD-10-CM

## 2012-08-28 DIAGNOSIS — Z171 Estrogen receptor negative status [ER-]: Secondary | ICD-10-CM

## 2012-08-28 LAB — CBC WITH DIFFERENTIAL/PLATELET
Basophils Absolute: 0.1 10*3/uL (ref 0.0–0.1)
EOS%: 0 % (ref 0.0–7.0)
HCT: 26.4 % — ABNORMAL LOW (ref 34.8–46.6)
HGB: 8.9 g/dL — ABNORMAL LOW (ref 11.6–15.9)
MCH: 31.3 pg (ref 25.1–34.0)
MONO#: 1.7 10*3/uL — ABNORMAL HIGH (ref 0.1–0.9)
NEUT#: 7.5 10*3/uL — ABNORMAL HIGH (ref 1.5–6.5)
NEUT%: 73.9 % (ref 38.4–76.8)
RDW: 14.2 % (ref 11.2–14.5)
WBC: 10.1 10*3/uL (ref 3.9–10.3)
lymph#: 0.9 10*3/uL (ref 0.9–3.3)

## 2012-08-28 LAB — COMPREHENSIVE METABOLIC PANEL (CC13)
AST: 16 U/L (ref 5–34)
Albumin: 3.2 g/dL — ABNORMAL LOW (ref 3.5–5.0)
Alkaline Phosphatase: 63 U/L (ref 40–150)
Calcium: 8.9 mg/dL (ref 8.4–10.4)
Chloride: 109 mEq/L (ref 98–109)
Glucose: 98 mg/dl (ref 70–140)
Potassium: 3.9 mEq/L (ref 3.5–5.1)
Sodium: 140 mEq/L (ref 136–145)
Total Protein: 6.8 g/dL (ref 6.4–8.3)

## 2012-08-28 MED ORDER — DOXORUBICIN HCL CHEMO IV INJECTION 2 MG/ML
60.0000 mg/m2 | Freq: Once | INTRAVENOUS | Status: AC
  Administered 2012-08-28: 92 mg via INTRAVENOUS
  Filled 2012-08-28: qty 46

## 2012-08-28 MED ORDER — SODIUM CHLORIDE 0.9 % IV SOLN
Freq: Once | INTRAVENOUS | Status: AC
  Administered 2012-08-28: 16:00:00 via INTRAVENOUS

## 2012-08-28 MED ORDER — DEXAMETHASONE SODIUM PHOSPHATE 20 MG/5ML IJ SOLN
12.0000 mg | Freq: Once | INTRAMUSCULAR | Status: AC
  Administered 2012-08-28: 12 mg via INTRAVENOUS

## 2012-08-28 MED ORDER — ACETAMINOPHEN 325 MG PO TABS
650.0000 mg | ORAL_TABLET | Freq: Once | ORAL | Status: AC
  Administered 2012-08-28: 650 mg via ORAL

## 2012-08-28 MED ORDER — SODIUM CHLORIDE 0.9 % IV SOLN
600.0000 mg/m2 | Freq: Once | INTRAVENOUS | Status: AC
  Administered 2012-08-28: 920 mg via INTRAVENOUS
  Filled 2012-08-28: qty 46

## 2012-08-28 MED ORDER — PALONOSETRON HCL INJECTION 0.25 MG/5ML
0.2500 mg | Freq: Once | INTRAVENOUS | Status: AC
  Administered 2012-08-28: 0.25 mg via INTRAVENOUS

## 2012-08-28 MED ORDER — SODIUM CHLORIDE 0.9 % IJ SOLN
10.0000 mL | INTRAMUSCULAR | Status: DC | PRN
  Administered 2012-08-28: 10 mL
  Filled 2012-08-28: qty 10

## 2012-08-28 MED ORDER — SODIUM CHLORIDE 0.9 % IV SOLN
150.0000 mg | Freq: Once | INTRAVENOUS | Status: AC
  Administered 2012-08-28: 150 mg via INTRAVENOUS
  Filled 2012-08-28: qty 5

## 2012-08-28 MED ORDER — HEPARIN SOD (PORK) LOCK FLUSH 100 UNIT/ML IV SOLN
500.0000 [IU] | Freq: Once | INTRAVENOUS | Status: AC | PRN
  Administered 2012-08-28: 500 [IU]
  Filled 2012-08-28: qty 5

## 2012-08-28 NOTE — Progress Notes (Signed)
OFFICE PROGRESS NOTE  CC  [name], MD [address] [place] [address] Dr. Everardo Beals Dr. 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 Patient had a palpable mass at the 3:00 position. She was seen at the breast Center on 06/20/2012 she had a mammogram and ultrasound showing a 1.7 cm mass at the 3:00 position. 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 Tumor was grade 3.  #2 MRIs performed on 06/26/2012 showed no biopsy mass at 3:00 but also 2 other adjacent masses at 1 at 2:00 measuring 0.8 x 1.6 x 2.2 cm andalso 6 mm ill-defined mass posterior to the biopsy-proven malignancy.  #3 patient has gone on to have a Port-A-Cath placed to begin neoadjuvant chemotherapy initially consisting of Adriamycin Cytoxan to be given dose dense for 4 cycles followed by weekly Taxol and carboplatinum for a total of 12 weeks.carefully agent made to have an excellent response to neoadjuvant chemotherapy and could possibly have breast preservation of course she may even need chest mastectomy with sentinel lymph node biopsies. Patient and I had met at the [facility] clinic we discussed the rationale for upfront chemotherapy she is now here to receive that  CURRENT THERAPY: cycle 4 day 1 of Adriamycin and Cytoxan  INTERVAL HISTORY: Jaime Murillo 52 y.o. female returns for evaluation prior to her fourth cycle of Adriamycin/Cytoxan.  She is doing well today.  She denies fevers, chills, nausea, vomiting, constipation, diarrhea, numbness or any other concerns.   MEDICAL HISTORY: Past Medical History  Diagnosis Date  . HSV infection   . NSVD (normal spontaneous vaginal delivery)   .  Hypertension     takes HCTZ  . Urinary frequency   . Anemia     takes Ferrous Sulfate occasionally  . Breast cancer 2014    triple negative    ALLERGIES:  has No Known Allergies.  MEDICATIONS:  Current Outpatient Prescriptions  Medication Sig Dispense Refill  . ciprofloxacin (CIPRO) 500 MG tablet Take 1 tablet (500 mg total) by mouth 2 (two) times daily.  14 tablet  5  . dexamethasone (DECADRON) 4 MG tablet Take 4 mg by mouth 2 (two) times daily with a meal. Take 2 tabs every day on the day after chemo an then take 2 tabs twice a day with food for 2 days.      . ferrous sulfate 325 (65 FE) MG tablet Take 325 mg by mouth daily with breakfast.      . hydrochlorothiazide (HYDRODIURIL) 25 MG tablet Take 1 tablet (25 mg total) by mouth daily.  30 tablet  11  . lidocaine-prilocaine (EMLA) cream Apply 1 application topically as needed (apply to PAC site 1-2 hours prior to treatment.).      Marland Kitchen LORazepam (ATIVAN) 0.5 MG tablet Take 0.5 mg by mouth every 6 (six) hours as needed for anxiety (as needed for nausea and vomitting).      . ondansetron (ZOFRAN) 8 MG tablet Take 8 mg by mouth every 8 (eight) hours as needed for nausea (Take 1 pill by mouth twice a day as needed  for nausea and vomitting starting 3rd day after chemo).      Marland Kitchen oxyCODONE-acetaminophen (ROXICET) 5-325 MG per tablet Take 1-2 tablets  by mouth every 4 (four) hours as needed for pain.  30 tablet  0  . prochlorperazine (COMPAZINE) 10 MG tablet Take 10 mg by mouth every 6 (six) hours as needed (for nausea and vomitting).      . prochlorperazine (COMPAZINE) 25 MG suppository Place 25 mg rectally every 12 (twelve) hours as needed for nausea (as needed for nausea and vomitting).      Marland Kitchen UNABLE TO FIND Apply 1 application topically once. 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  1 application  0  . valACYclovir (VALTREX) 500 MG tablet Take 500 mg by mouth daily as needed. For herpes        No current facility-administered medications for this visit.    SURGICAL HISTORY:  Past Surgical History  Procedure Laterality Date  . Tubal ligation  2003    LTL  . Portacath placement N/A 07/06/2012    Procedure: INSERTION PORT-A-CATH;  Surgeon: Almond Lint, MD;  Location: [facility] OR;  Service: General;  Laterality: N/A;    REVIEW OF SYSTEMS:  General: fatigue (-), night sweats (-), fever (-), pain (-) Lymph: palpable nodes (-) HEENT: vision changes (-), mucositis (-), gum bleeding (-), epistaxis (-) Cardiovascular: chest pain (-), palpitations (-) Pulmonary: shortness of breath (-), dyspnea on exertion (-), cough (-), hemoptysis (-) GI:  Early satiety (-), melena (-), dysphagia (-), nausea/vomiting (-), diarrhea (-) GU: dysuria (-), hematuria (-), incontinence (-) Musculoskeletal: joint swelling (-), joint pain (-), back pain (-) Neuro: weakness (-), numbness (-), headache (-), confusion (-) Skin: Rash (-), lesions (-), dryness (-) Psych: depression (-), suicidal/homicidal ideation (-), feeling of hopelessness (-)   PHYSICAL EXAMINATION: Blood pressure 119/72, pulse 87, temperature 98.4 F (36.9 C), temperature source Oral, resp. rate 20, height 5\' 3"  (1.6 m), weight 119 lb 12.8 oz (54.341 kg). Body mass index is 21.23 kg/(m^2). General: Patient is a well appearing female in no acute distress HEENT: PERRLA, sclerae anicteric no conjunctival pallor, MMM Neck: supple, no palpable adenopathy Lungs: clear to auscultation bilaterally, no wheezes, rhonchi, or rales Cardiovascular: regular rate rhythm, S1, S2, no murmurs, rubs or gallops Abdomen: Soft, non-tender, non-distended, normoactive bowel sounds, no HSM Extremities: warm and well perfused, no clubbing, cyanosis, or edema Skin: No rashes or lesions Neuro: Non-focal Breast examination: Left breast no masses or nipple discharge Right breast reveals 3 discrete masses at the 3:00 position 2:00 position.  It is nontender there is  no nipple discharge. Conglomeration of these masses to my exam measure about 2-3cm. ECOG PERFORMANCE STATUS: 0 - Asymptomatic      LABORATORY DATA: Lab Results  Component Value Date   WBC 10.1 08/28/2012   HGB 8.9* 08/28/2012   HCT 26.4* 08/28/2012   MCV 93.0 08/28/2012   PLT 262 08/28/2012      Chemistry      Component Value Date/Time   NA 140 08/28/2012 1330   NA 137 06/06/2012 0853   K 3.9 08/28/2012 1330   K 3.8 06/06/2012 0853   CL 104 07/17/2012 1357   CL 102 06/06/2012 0853   CO2 25 08/28/2012 1330   CO2 26 06/06/2012 0853   BUN 15.0 08/28/2012 1330   BUN 12 06/06/2012 0853   CREATININE 0.8 08/28/2012 1330   CREATININE 0.70 06/06/2012 0853   CREATININE 0.85 04/24/2009 1114      Component Value Date/Time   CALCIUM 8.9 08/28/2012 1330   CALCIUM 9.3 06/06/2012 0853   ALKPHOS 63 08/28/2012 1330  ALKPHOS 35* 06/06/2012 0853   AST 16 08/28/2012 1330   AST 14 06/06/2012 0853   ALT 10 08/28/2012 1330   ALT 9 06/06/2012 0853   BILITOT <0.20 Repeated and Verified 08/28/2012 1330   BILITOT 0.8 06/06/2012 0853     ADDITIONAL INFORMATION: PROGNOSTIC INDICATORS - ACIS Results: IMMUNOHISTOCHEMICAL AND MORPHOMETRIC ANALYSIS BY THE AUTOMATED CELLULAR IMAGING SYSTEM (ACIS) Estrogen Receptor: 0%, NEGATIVE Progesterone Receptor: 0%, NEGATIVE Proliferation Marker Ki67: 95% COMMENT: The negative hormone receptor study(ies) in this case have an internal positive control. REFERENCE RANGE ESTROGEN RECEPTOR NEGATIVE <1% POSITIVE =>1% PROGESTERONE RECEPTOR NEGATIVE <1% POSITIVE =>1% All controls stained appropriately Pecola Leisure MD Pathologist, Electronic Signature ( Signed 06/28/2012) CHROMOGENIC IN-SITU HYBRIDIZATION Results: HER-2/NEU BY CISH - NO AMPLIFICATION OF HER-2 DETECTED. RESULT RATIO OF HER2: CEP 17 SIGNALS 1.26 1 of 3 Duplicate copy FINAL for ERICA, RICHWINE [phone]) ADDITIONAL INFORMATION:(continued) AVERAGE HER2 COPY NUMBER PER CELL 2.45 REFERENCE RANGE NEGATIVE HER2/Chr17 Ratio <2.0  and Average HER2 copy number <4.0 EQUIVOCAL HER2/Chr17 Ratio <2.0 and Average HER2 copy number 4.0 and <6.0 POSITIVE HER2/Chr17 Ratio >=2.0 and/or Average HER2 copy number >=6.0 Jimmy Picket MD Pathologist, Electronic Signature ( Signed 06/26/2012) FINAL DIAGNOSIS Diagnosis Breast, right, needle core biopsy, 3:00 position - INVASIVE DUCTAL CARCINOMA SEE COMMENT. - DUCTAL CARCINOMA IN SITU Microscopic Comment Although the grade of tumor is best assessed at resection, with these biopsies, both the invasive and in situ carcinoma are grade III. Breast prognostic studies are pending and will be reported in an addendum. The case is reviewed with Dr. Raynald Blend who concurs. (CRR:gt, 06/21/12) [place] RUND DO Pathologist, Electronic FINAL DIAGNOSIS Diagnosis Breast, right, needle core biopsy, mass - BENIGN BREAST TISSUE WITH FOCAL PSEUDOANGIOMATOUS STROMAL HYPERPLASIA (PASH). NO EVIDENCE OF MALIGNANCY. Microscopic Comment Called to the [facility] on 07/13/12. (JDP:caf 07/13/12)  RADIOGRAPHIC STUDIES:  Dg Chest 2 View  07/04/2012   *RADIOLOGY REPORT*  Clinical Data: Insertion of Port-A-Cath.  Breast cancer. Hypertension.  CHEST - 2 VIEW  Comparison: CT of the chest on 07/03/2012  Findings: Cardiomediastinal silhouette is within normal limits. The lungs are free of focal consolidations and pleural effusions. Bony structures have a normal appearance.  Contrast is identified within the colon following CT of the abdomen.  IMPRESSION: Negative exam.   Original Report Authenticated By: Norva Pavlov, M.D.   Ct Chest W Contrast  07/03/2012   *RADIOLOGY REPORT*  Clinical Data:  Breast cancer.  No therapy yet.  Lower inner quadrant of right breast.  CT CHEST, ABDOMEN AND PELVIS WITH CONTRAST  Technique: Contiguous axial images of the chest abdomen and pelvis were obtained after IV contrast administration.  Contrast: 80 ml Omnipaque-300  Comparison: Breast MR of 06/26/2012.  No prior CTs.  CT  CHEST  Findings: Lung windows demonstrate minimal left lower lobe subpleural nodularity at 3 mm on image 40/series 4.  Right lung clear.  Soft tissue windows demonstrate small bilateral axillary nodes, without adenopathy.  No subpectoral adenopathy. No supraclavicular adenopathy.  Normal heart size, without pericardial effusion.  No central pulmonary embolism, on this non-dedicated study.  No mediastinal or hilar adenopathy.  No internal mammary adenopathy. Minimal residual thymic tissue in the anterior mediastinum (image 17/series 2).  IMPRESSION:  1. No acute process or evidence of metastatic disease in the chest. 2.  Probable subpleural lymph node in the left lower lobe. Recommend attention on follow-up.  CT ABDOMEN AND PELVIS  Findings:  Mild early phase of hepatic enhancement which decreases sensitivity for focal liver  lesions.  None identified.  Normal spleen, stomach, pancreas, gallbladder, biliary tract, adrenal glands, kidneys. No retroperitoneal or retrocrural adenopathy.  Normal colon, appendix, and terminal ileum.  Normal small bowel without abdominal ascites.  No pelvic adenopathy.    Normal urinary bladder and uterus.  No adnexal mass.  Trace free pelvic fluid is likely physiologic.  Transitional right-sided lumbosacral anatomy.  Degenerative disc disease at the lumbosacral junction.  IMPRESSION:  No acute process or evidence of metastatic disease in the abdomen or pelvis.   Original Report Authenticated By: Jeronimo Greaves, M.D.   US Breast Right  06/29/2012   *RADIOLOGY REPORT*  Clinical Data:  52 year old female with newly diagnosed right breast cancer.  Suspicious abnormal enhancement anterior and superior to the biopsy-proven neoplasm - for second look ultrasound evaluation.  RIGHT BREAST ULTRASOUND  Comparison:  Prior mammograms, ultrasounds and MRI.  Findings: Ultrasound is performed, showing the biopsy-proven neoplasm at the 3 o'clock position of the right breast 4 cm from the nipple. No other  discrete sonographic abnormalities are identified, specifically medial, superior, and anterior to the biopsy-proven neoplasm.  IMPRESSION: Patchy abnormal enhancement medial superior to the biopsy-proven neoplasm identified on recent MRI is not visualized sonographically as a discrete abnormality.  MR guided biopsy is recommended if breast conservation surgery is considered.  BI-RADS CATEGORY 6:  Known biopsy-proven malignancy - appropriate action should be taken.  RECOMMENDATION: MR guided right breast biopsy, which will be scheduled by our office.  I have discussed the findings and recommendations with the patient. Results were also provided in writing at the conclusion of the visit.  If applicable, a reminder letter will be sent to the patient regarding the next appointment.   Original Report Authenticated By: Harmon Pier, M.D.   US Breast Right  06/20/2012   *RADIOLOGY REPORT*  Clinical Data:  52 year old female for annual bilateral mammograms and palpable mass in the upper and inner right breast discovered on clinical examination.  DIGITAL DIAGNOSTIC BILATERAL MAMMOGRAM WITH CAD AND RIGHT BREAST ULTRASOUND:  Comparison:  03/21/2009 and prior mammograms dating back to 01/21/2005  Findings:  ACR Breast Density Category 4: The breast tissue is extremely dense.  Routine and spot compression views of the right breast demonstrate no evidence of suspicious mass or distortion. Scattered punctate calcifications within the breasts bilaterally are again identified.  Mammographic images were processed with CAD.  On physical exam, thickening is identified at the 12 o'clock position of the right breast 4 cm from the nipple. A firm palpable mass identified at the 3 o'clock position of the right breast 4 cm from the nipple.  Ultrasound is performed, showing no evidence of solid or cystic mass, distortion or abnormal areas of shadowing at the 12 o'clock position of the right breast. A 7 x 6 x 17 mm slightly irregular oval  hypoechoic mass with calcifications is horizontally oriented at the 3 o'clock position of the right breast 4 cm from the nipple.  IMPRESSION: Indeterminate 7 x 6 x 17 mm hypoechoic mass at the 3 o'clock position of the right breast.  Tissue sampling is recommended.  No mammographic evidence of breast malignancy bilaterally.  No mammographic, palpable or sonographic abnormality at the 12 o'clock position of the right breast, in the area of palpable concern.  BI-RADS CATEGORY 4:  Suspicious abnormality - biopsy should be considered.  RECOMMENDATION:  Ultrasound guided right breast biopsy, which will be performed today but dictated in a separate report.  I have discussed the findings and recommendations with the  patient. Results were also provided in writing at the conclusion of the visit.  If applicable, a reminder letter will be sent to the patient regarding the next appointment.   Original Report Authenticated By: Harmon Pier, M.D.   Ct Abdomen Pelvis W Contrast  07/03/2012   *RADIOLOGY REPORT*  Clinical Data:  Breast cancer.  No therapy yet.  Lower inner quadrant of right breast.  CT CHEST, ABDOMEN AND PELVIS WITH CONTRAST  Technique: Contiguous axial images of the chest abdomen and pelvis were obtained after IV contrast administration.  Contrast: 80 ml Omnipaque-300  Comparison: Breast MR of 06/26/2012.  No prior CTs.  CT CHEST  Findings: Lung windows demonstrate minimal left lower lobe subpleural nodularity at 3 mm on image 40/series 4.  Right lung clear.  Soft tissue windows demonstrate small bilateral axillary nodes, without adenopathy.  No subpectoral adenopathy. No supraclavicular adenopathy.  Normal heart size, without pericardial effusion.  No central pulmonary embolism, on this non-dedicated study.  No mediastinal or hilar adenopathy.  No internal mammary adenopathy. Minimal residual thymic tissue in the anterior mediastinum (image 17/series 2).  IMPRESSION:  1. No acute process or evidence of metastatic  disease in the chest. 2.  Probable subpleural lymph node in the left lower lobe. Recommend attention on follow-up.  CT ABDOMEN AND PELVIS  Findings:  Mild early phase of hepatic enhancement which decreases sensitivity for focal liver lesions.  None identified.  Normal spleen, stomach, pancreas, gallbladder, biliary tract, adrenal glands, kidneys. No retroperitoneal or retrocrural adenopathy.  Normal colon, appendix, and terminal ileum.  Normal small bowel without abdominal ascites.  No pelvic adenopathy.    Normal urinary bladder and uterus.  No adnexal mass.  Trace free pelvic fluid is likely physiologic.  Transitional right-sided lumbosacral anatomy.  Degenerative disc disease at the lumbosacral junction.  IMPRESSION:  No acute process or evidence of metastatic disease in the abdomen or pelvis.   Original Report Authenticated By: Jeronimo Greaves, M.D.   Mr Breast Right W Wo Contrast  07/12/2012   *RADIOLOGY REPORT*  Clinical Data:  Known invasive mammary carcinoma in the right breast at 3 o'clock posteriorly.  Additional site of abnormal enhancement in the right upper inner quadrant closer to the nipple for possible biopsy.  ATTEMPTED MRI GUIDED VACUUM ASSISTED BIOPSY OF THE RIGHT BREAST WITHOUT AND WITH CONTRAST  Comparison: Previous exams.  Technique: Multiplanar, multisequence MR images of the right breast were obtained prior to and following the intravenous administration of 9 ml of Mulithance.  I met with the patient, and we discussed the procedure of MRI guided biopsy, including risks, benefits, and alternatives. Specifically, we discussed the risks of infection, bleeding, tissue injury, clip migration, and inadequate sampling.  Informed, written consent was given.  Using sterile technique, 2% Lidocaine, MRI guidance, and a 9 gauge vacuum assisted device, biopsy was attempted using a mediolateral approach.  However, the procedure was unsuccessful due to the extremely thin breast compression.  IMPRESSION:   Unsuccessful MRI guided vacuum-assisted biopsy of the right breast.  Patient was sent to the [facility] for further evaluation with ultrasound.   Original Report Authenticated By: Cain Saupe, M.D.   Mr Breast Bilateral W Wo Contrast  06/26/2012   *RADIOLOGY REPORT*  Clinical Data: History of new diagnosis of breast cancer from recent ultrasound guided core needle biopsy right breast 06/20/2012 demonstrating invasive ductal carcinoma with DCIS.  GFR 89, creatinine 0.7 on 06/06/2012.  BILATERAL BREAST MRI WITH AND WITHOUT CONTRAST  Technique: Multiplanar, multisequence MR images  of both breasts were obtained prior to and following the intravenous administration of 10ml of multihance.  Three dimensional images were evaluated at the independent DynaCad workstation.  Comparison:  Recent mammograms and ultrasound.  Findings: Examination demonstrates mild background parenchymal enhancement.  Right breast:  There is evidence of an ovoid gently lobulated and somewhat ill-defined heterogeneously enhancing mass over the deep third of the inner midportion of the right breast representing patient's biopsy-proven malignancy.  This mass contains central clip artifact and measures approximately 0.9 x 1.9 x 1.0 cm. The anterior border of this mass is located 5.5 cm from the nipple. There is a 6 mm slightly ill-defined enhancing ovoid mass 6 mm posterior lateral to the biopsy-proven malignancy lying just superficial to the pectoralis muscle.  There is also clumped non mass enhancement anterior and slightly superior to the biopsy- proven malignancy in the middle third at approximately the 2 o'clock position.  This measures approximately 0.8 x 1.6 x 1.2 cm and is located 3 cm from the nipple.  This is suspicious for multifocal disease.  No suspicious axillary or internal mammary lymph nodes.  Left breast:  No suspicious mass, enhancement or adenopathy.  IMPRESSION: Known biopsy-proven malignancy over the deep third of the  inner mid right breast.  6 mm ill-defined enhancing ovoid mass 6 mm posterior lateral to the biopsy-proven malignancy and lying just superficial to the pectoralis muscle.  Clumped non mass enhancement anterior and superior to patient's known malignancy at approximately the 2 o'clock position measuring 0.8 x 1.6 x 2.2 cm.  Findings likely represent multi focal disease.  RECOMMENDATION: Recommend second look ultrasound and core biopsy if feasible of the clumped non mass enhancement at approximately the 2 o'clock position right breast anterior/superior to the known malignancy. If not seen by ultrasound, recommend proceeding to MR guided biopsy of this suspicious abnormality.  THREE-DIMENSIONAL MR IMAGE RENDERING ON INDEPENDENT WORKSTATION:  Three-dimensional MR images were rendered by post-processing of the original MR data on an independent workstation.  The three- dimensional MR images were interpreted, and findings were reported in the accompanying complete MRI report for this study.  BI-RADS CATEGORY 0:  Incomplete.  Need additional imaging evaluation and/or prior mammograms for comparison.   Original Report Authenticated By: Elberta Fortis, M.D.   Dg Chest Port 1 View  07/06/2012   *RADIOLOGY REPORT*  Clinical Data: Post left-sided port catheter insertion  PORTABLE CHEST - 1 VIEW  Comparison: 07/04/2012  Findings:  Grossly unchanged cardiac silhouette and mediastinal contours. Interval placement of a left anterior chest wall subclavian vein approach port-a-catheter with tip projecting over the superior cavoatrial junction. Grossly unchanged symmetric biapical pleural parenchymal thickening.  No pneumothorax.  No focal airspace opacities.  No pleural effusion.  No evidence of edema.  Unchanged bones.  IMPRESSION: Interval placement of a left anterior chest wall subclavian vein approach port-a-catheter with tip overlying the superior cavoatrial junction.  No pneumothorax.   Original Report Authenticated By: Tacey Ruiz, MD   Mm Digital Diagnostic Bilat  06/20/2012   *RADIOLOGY REPORT*  Clinical Data:  52 year old female for annual bilateral mammograms and palpable mass in the upper and inner right breast discovered on clinical examination.  DIGITAL DIAGNOSTIC BILATERAL MAMMOGRAM WITH CAD AND RIGHT BREAST ULTRASOUND:  Comparison:  03/21/2009 and prior mammograms dating back to 01/21/2005  Findings:  ACR Breast Density Category 4: The breast tissue is extremely dense.  Routine and spot compression views of the right breast demonstrate no evidence of suspicious mass or distortion.  Scattered punctate calcifications within the breasts bilaterally are again identified.  Mammographic images were processed with CAD.  On physical exam, thickening is identified at the 12 o'clock position of the right breast 4 cm from the nipple. A firm palpable mass identified at the 3 o'clock position of the right breast 4 cm from the nipple.  Ultrasound is performed, showing no evidence of solid or cystic mass, distortion or abnormal areas of shadowing at the 12 o'clock position of the right breast. A 7 x 6 x 17 mm slightly irregular oval hypoechoic mass with calcifications is horizontally oriented at the 3 o'clock position of the right breast 4 cm from the nipple.  IMPRESSION: Indeterminate 7 x 6 x 17 mm hypoechoic mass at the 3 o'clock position of the right breast.  Tissue sampling is recommended.  No mammographic evidence of breast malignancy bilaterally.  No mammographic, palpable or sonographic abnormality at the 12 o'clock position of the right breast, in the area of palpable concern.  BI-RADS CATEGORY 4:  Suspicious abnormality - biopsy should be considered.  RECOMMENDATION:  Ultrasound guided right breast biopsy, which will be performed today but dictated in a separate report.  I have discussed the findings and recommendations with the patient. Results were also provided in writing at the conclusion of the visit.  If applicable, a  reminder letter will be sent to the patient regarding the next appointment.   Original Report Authenticated By: Harmon Pier, M.D.   Mm Digital Diagnostic Unilat R  07/12/2012   *RADIOLOGY REPORT*  Clinical Data:  Ultrasound-guided core needle biopsy of a hypoechoic area at 2 o'clock 2 cm from the right nipple with clip placement.  Known invasive mammary carcinoma in the right breast. Unsuccessful right breast MRI guided biopsy earlier today.  DIGITAL DIAGNOSTIC RIGHT MAMMOGRAM  Comparison:  Previous exams.  Findings:  Films are performed following ultrasound guided biopsy of a small hypoechoic area at 2 o'clock 2 cm from the right nipple. The ribbon clip is positioned within the area of concern. The ribbon clip is approximately 2.6 cm anterior to the top hat clip from the original biopsy on 06/20/2012.  IMPRESSION: Appropriate clip placement following ultrasound-guided core needle biopsy of a hypoechoic area at 2 o'clock 2 cm in the right nipple.   Original Report Authenticated By: Cain Saupe, M.D.   Mm Digital Diagnostic Unilat R  06/20/2012   *RADIOLOGY REPORT*  Clinical Data:  52 year old female - evaluate clip placement following ultrasound guided right breast biopsy.  DIGITAL DIAGNOSTIC RIGHT MAMMOGRAM  Comparison:  Previous exams.  Findings:  Films are performed following ultrasound guided biopsy of the 7 x 6 x 17 mm hypoechoic mass at the 3 o'clock position of the right breast 4 cm from the nipple. The T shaped biopsy clip is in satisfactory position.  IMPRESSION: Satisfactory clip placement following ultrasound guided right breast biopsy.  Pathology will be followed.   Original Report Authenticated By: Harmon Pier, M.D.   Dg Fluoro Guide Cv Line-no Report  07/06/2012   CLINICAL DATA: port placement   FLOURO GUIDE CV LINE  Fluoroscopy was utilized by the requesting physician.  No radiographic  interpretation.    [place] Rt Breast Bx W Loc Dev 1st Lesion Img Bx Spec US Guide  07/13/2012    **ADDENDUM** CREATED: 07/13/2012 14:11:45  I spoke with the patient by telephone on 07/13/2012 to discuss pathology results.  Pathology demonstrates  pseudoangiomatous stromal hyperplasia.  The findings were called to the office of Dr. Donell Beers. Although  the imaging findings could be related to pseudoangiomatous stromal hyperplasia, excision of the anterior biopsy site is suggested at the time of patient's definitive surgery.  The patient is considering unilateral or bilateral mastectomy at this time.  The patient reports no problems at the biopsy site.  All questions were answered.  Recommendations:  Excision of the biopsy site. The patient is scheduled to begin chemotherapy in 4 days.  **END ADDENDUM** SIGNED BY: Blair Hailey. Manson Passey, M.D.  07/12/2012   *RADIOLOGY REPORT*  Clinical Data:  Unsuccessful MRI guided biopsy of the area of abnormal enhancement in the left anterior upper inner quadrant, anterior to the known invasive mammary carcinoma.  Second look ultrasound performed and a 5 x 4 x 4 mm hypoechoic area found at 2 o'clock 2 cm from the right nipple which will be biopsied.  ULTRASOUND GUIDED VACUUM ASSISTED CORE BIOPSY OF THE RIGHT BREAST  Sonography of the right upper inner quadrant was performed.  There is an ill-defined hypoechoic 5 x 4 x 4 mm area at 2 o'clock 2 cm from the right nipple which will be biopsied with ultrasound guidance.  The patient and I discussed the procedure of ultrasound-guided biopsy, including benefits and alternatives.  We discussed the high likelihood of a successful procedure. We discussed the risks of the procedure including infection, bleeding, tissue injury, clip migration, and inadequate sampling.  Written informed consent was given.  Using sterile technique, 2% lidocaine, ultrasound guidance, and a 12 gauge vacuum assisted needle, biopsy was performed of the hypoechoic area at 2 o'clock 2 cm from the right nipple using a caudocranial approach.  At the conclusion of the  procedure, a within tissue marker clip was deployed into the biopsy cavity. Follow-up 2-view mammogram was performed and dictated separately.  IMPRESSION: Ultrasound-guided biopsy of a 5 x 4 x 4 mm hypoechoic area at 2 o'clock 2 cm from the right nipple.  No apparent complications.   Original Report Authenticated By: Cain Saupe, M.D.   [place] Rt Breast Bx W Loc Dev 1st Lesion Img Bx Spec US Guide  06/21/2012   *RADIOLOGY REPORT*  Clinical Data:  Indeterminate 7 x 6 x 17 mm hypoechoic mass in the inner right breast - for tissue sampling.  ULTRASOUND GUIDED VACUUM ASSISTED CORE BIOPSY OF THE RIGHT BREAST  Comparison: Previous exams.  I met with the patient and we discussed the procedure of ultrasound- guided biopsy, including benefits and alternatives.  We discussed the high likelihood of a successful procedure. We discussed the risks of the procedure including infection, bleeding, tissue injury, clip migration, and inadequate sampling.  Informed written consent was given.  Using sterile technique, 2% lidocaine ultrasound guidance and a 12 gauge vacuum assisted needle biopsy was performed of the 7 x 6 x 17 mm hypoechoic mass at the 3 o'clock position of the right breast 4 cm from the nipple using a lateral approach.  At the conclusion of the procedure, a T shaped tissue marker clip was deployed into the biopsy cavity.  Follow-up 2-view mammogram was performed and dictated separately.  IMPRESSION: Ultrasound-guided biopsy of right breast mass.  No apparent complications.  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 Histology correlates with imaging findings.  The patient was contacted by phone on 06/21/2012 and these results given to her which she understood. Her questions were answered. She was encouraged to obtain breast cancer educational material from the [facility] at her convenience. The patient had no complaints with her biopsy site.  Recommend surgery/oncology consultation.  An  appointment at [facility] has been scheduled for 06/28/2012. Recommend bilateral breast MRI, which has been scheduled for 06/26/2012. The patient was informed of these appointments.   Original Report Authenticated By: Harmon Pier, M.D.    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 Patient had other areas seen on MRI biopsy of the second mass was negative. Patient has had her Port-A-Cath placed. She is proceeding neoadjuvant chemotherapy to possibly conserve her breast. Chemotherapy will consist of Adriamycin Cytoxan given dose dense for 4 cycles starting today 07/17/2012. She will receive Neulasta support on day 2 for each cycle. When she completes this then she will begin weekly Taxol and carboplatinum for a total of 12 weeks.  #2 we discussed risks benefits and side effects of chemotherapy. She has had an echocardiogram as well as chemotherapy teaching class. Port-A-Cath was placed by Dr. Donell Beers. The port is functioning well.    PLAN:  #1 Patient's labs have recovered.  She will proceed with chemotherapy today.    #2 I gave her information regarding Taxol Carbo as she will proceed with this treatment next week.  We requested the appointments be made today.    All questions were answered. The patient knows to call the clinic with any problems, questions or concerns. We can certainly see the patient much sooner if necessary.  I spent 25 minutes counseling the patient face to face. The total time spent in the appointment was 30 minutes.   Cherie Ouch Lyn Hollingshead, NP Medical Oncology [facility] Phone: [phone]   08/29/2012, 10:22 PM

## 2012-08-28 NOTE — Progress Notes (Signed)
1705 c/o frontal and sinus head ache 3-6/10, dull. Order from Dr Welton Flakes received for 2 reg tylenol. Given. 1725 pt states pain is easing

## 2012-08-28 NOTE — Patient Instructions (Addendum)
[facility] Discharge Instructions for Patients Receiving Chemotherapy  Today you received the following chemotherapy agents ADRIAMYCIN, CYTOXAN  To help prevent nausea and vomiting after your treatment, we encourage you to take your nausea medication IF NEEDED.   If you develop nausea and vomiting that is not controlled by your nausea medication, call the clinic.   BELOW ARE SYMPTOMS THAT SHOULD BE REPORTED IMMEDIATELY:  *FEVER GREATER THAN 100.5 F  *CHILLS WITH OR WITHOUT FEVER  NAUSEA AND VOMITING THAT IS NOT CONTROLLED WITH YOUR NAUSEA MEDICATION  *UNUSUAL SHORTNESS OF BREATH  *UNUSUAL BRUISING OR BLEEDING  TENDERNESS IN MOUTH AND THROAT WITH OR WITHOUT PRESENCE OF ULCERS  *URINARY PROBLEMS  *BOWEL PROBLEMS  UNUSUAL RASH Items with * indicate a potential emergency and should be followed up as soon as possible.  Feel free to call the clinic you have any questions or concerns. The clinic phone number is [phone].

## 2012-08-28 NOTE — Patient Instructions (Addendum)
Carboplatin injection  What is this medicine?  CARBOPLATIN (KAR boe pla tin) is a chemotherapy drug. It targets fast dividing cells, like cancer cells, and causes these cells to die. This medicine is used to treat ovarian cancer and many other cancers.  This medicine may be used for other purposes; ask your health care provider or pharmacist if you have questions.  What should I tell my health care provider before I take this medicine?  They need to know if you have any of these conditions:  -blood disorders  -hearing problems  -kidney disease  -recent or ongoing radiation therapy  -an unusual or allergic reaction to carboplatin, cisplatin, other chemotherapy, other medicines, foods, dyes, or preservatives  -pregnant or trying to get pregnant  -breast-feeding  How should I use this medicine?  This drug is usually given as an infusion into a vein. It is administered in a hospital or clinic by a specially trained health care professional.  Talk to your pediatrician regarding the use of this medicine in children. Special care may be needed.  Overdosage: If you think you have taken too much of this medicine contact a poison control center or emergency room at once.  NOTE: This medicine is only for you. Do not share this medicine with others.  What if I miss a dose?  It is important not to miss a dose. Call your doctor or health care professional if you are unable to keep an appointment.  What may interact with this medicine?  -medicines for seizures  -medicines to increase blood counts like filgrastim, pegfilgrastim, sargramostim  -some antibiotics like amikacin, gentamicin, neomycin, streptomycin, tobramycin  -vaccines  Talk to your doctor or health care professional before taking any of these medicines:  -acetaminophen  -aspirin  -ibuprofen  -ketoprofen  -naproxen  This list may not describe all possible interactions. Give your health care provider a list of all the medicines, herbs, non-prescription drugs, or dietary  supplements you use. Also tell them if you smoke, drink alcohol, or use illegal drugs. Some items may interact with your medicine.  What should I watch for while using this medicine?  Your condition will be monitored carefully while you are receiving this medicine. You will need important blood work done while you are taking this medicine.  This drug may make you feel generally unwell. This is not uncommon, as chemotherapy can affect healthy cells as well as cancer cells. Report any side effects. Continue your course of treatment even though you feel ill unless your doctor tells you to stop.  In some cases, you may be given additional medicines to help with side effects. Follow all directions for their use.  Call your doctor or health care professional for advice if you get a fever, chills or sore throat, or other symptoms of a cold or flu. Do not treat yourself. This drug decreases your body's ability to fight infections. Try to avoid being around people who are sick.  This medicine may increase your risk to bruise or bleed. Call your doctor or health care professional if you notice any unusual bleeding.  Be careful brushing and flossing your teeth or using a toothpick because you may get an infection or bleed more easily. If you have any dental work done, tell your dentist you are receiving this medicine.  Avoid taking products that contain aspirin, acetaminophen, ibuprofen, naproxen, or ketoprofen unless instructed by your doctor. These medicines may hide a fever.  Do not become pregnant while taking this medicine.   Do not breast-feed an infant while taking this medicine. What side effects may I notice from receiving this medicine? Side effects that you should report to your  doctor or health care professional as soon as possible: -allergic reactions like skin rash, itching or hives, swelling of the face, lips, or tongue -signs of infection - fever or chills, cough, sore throat, pain or difficulty passing urine -signs of decreased platelets or bleeding - bruising, pinpoint red spots on the skin, black, tarry stools, nosebleeds -signs of decreased red blood cells - unusually weak or tired, fainting spells, lightheadedness -breathing problems -changes in hearing -changes in vision -chest pain -high blood pressure -low blood counts - This drug may decrease the number of white blood cells, red blood cells and platelets. You may be at increased risk for infections and bleeding. -nausea and vomiting -pain, swelling, redness or irritation at the injection site -pain, tingling, numbness in the hands or feet -problems with balance, talking, walking -trouble passing urine or change in the amount of urine Side effects that usually do not require medical attention (report to your doctor or health care professional if they continue or are bothersome): -hair loss -loss of appetite -metallic taste in the mouth or changes in taste This list may not describe all possible side effects. Call your doctor for medical advice about side effects. You may report side effects to FDA at 1-800-FDA-1088. Where should I keep my medicine? This drug is given in a hospital or clinic and will not be stored at home. NOTE: This sheet is a summary. It may not cover all possible information. If you have questions about this medicine, talk to your doctor, pharmacist, or health care provider.  2012, Elsevier/Gold Standard. (04/18/2007 2:38:05 PM)Paclitaxel injection What is this medicine? PACLITAXEL (PAK li TAX el) is a chemotherapy drug. It targets fast dividing cells, like cancer cells, and causes these cells to die. This medicine is used to treat ovarian cancer, breast cancer, and other  cancers. This medicine may be used for other purposes; ask your health care provider or pharmacist if you have questions. What should I tell my health care provider before I take this medicine? They need to know if you have any of these conditions: -blood disorders -irregular heartbeat -infection (especially a virus infection such as chickenpox, cold sores, or herpes) -liver disease -previous or ongoing radiation therapy -an unusual or allergic reaction to paclitaxel, alcohol, polyoxyethylated castor oil, other chemotherapy agents, other medicines, foods, dyes, or preservatives -pregnant or trying to get pregnant -breast-feeding How should I use this medicine? This drug is given as an infusion into a vein. It is administered in a hospital or clinic by a specially trained health care professional. Talk to your pediatrician regarding the use of this medicine in children. Special care may be needed. Overdosage: If you think you have taken too much of this medicine contact a poison control center or emergency room at once. NOTE: This medicine is only for you. Do not share this medicine with others. What if I miss a dose? It is important not to miss your dose. Call your doctor or health care professional if you are unable to keep an appointment. What may interact with this medicine? Do not take this medicine with any of the following medications: -disulfiram -metronidazole This medicine may also interact with the following medications: -cyclosporine -dexamethasone -diazepam -ketoconazole -medicines to increase blood counts like filgrastim, pegfilgrastim, sargramostim -other chemotherapy drugs like cisplatin, doxorubicin, epirubicin, etoposide, teniposide, vincristine -quinidine -testosterone -vaccines -  verapamil Talk to your doctor or health care professional before taking any of these medicines: -acetaminophen -aspirin -ibuprofen -ketoprofen -naproxen This list may not describe  all possible interactions. Give your health care provider a list of all the medicines, herbs, non-prescription drugs, or dietary supplements you use. Also tell them if you smoke, drink alcohol, or use illegal drugs. Some items may interact with your medicine. What should I watch for while using this medicine? Your condition will be monitored carefully while you are receiving this medicine. You will need important blood work done while you are taking this medicine. This drug may make you feel generally unwell. This is not uncommon, as chemotherapy can affect healthy cells as well as cancer cells. Report any side effects. Continue your course of treatment even though you feel ill unless your doctor tells you to stop. In some cases, you may be given additional medicines to help with side effects. Follow all directions for their use. Call your doctor or health care professional for advice if you get a fever, chills or sore throat, or other symptoms of a cold or flu. Do not treat yourself. This drug decreases your body's ability to fight infections. Try to avoid being around people who are sick. This medicine may increase your risk to bruise or bleed. Call your doctor or health care professional if you notice any unusual bleeding. Be careful brushing and flossing your teeth or using a toothpick because you may get an infection or bleed more easily. If you have any dental work done, tell your dentist you are receiving this medicine. Avoid taking products that contain aspirin, acetaminophen, ibuprofen, naproxen, or ketoprofen unless instructed by your doctor. These medicines may hide a fever. Do not become pregnant while taking this medicine. Women should inform their doctor if they wish to become pregnant or think they might be pregnant. There is a potential for serious side effects to an unborn child. Talk to your health care professional or pharmacist for more information. Do not breast-feed an infant while  taking this medicine. Men are advised not to father a child while receiving this medicine. What side effects may I notice from receiving this medicine? Side effects that you should report to your doctor or health care professional as soon as possible: -allergic reactions like skin rash, itching or hives, swelling of the face, lips, or tongue -low blood counts - This drug may decrease the number of white blood cells, red blood cells and platelets. You may be at increased risk for infections and bleeding. -signs of infection - fever or chills, cough, sore throat, pain or difficulty passing urine -signs of decreased platelets or bleeding - bruising, pinpoint red spots on the skin, black, tarry stools, nosebleeds -signs of decreased red blood cells - unusually weak or tired, fainting spells, lightheadedness -breathing problems -chest pain -high or low blood pressure -mouth sores -nausea and vomiting -pain, swelling, redness or irritation at the injection site -pain, tingling, numbness in the hands or feet -slow or irregular heartbeat -swelling of the ankle, feet, hands Side effects that usually do not require medical attention (report to your doctor or health care professional if they continue or are bothersome): -bone pain -complete hair loss including hair on your head, underarms, pubic hair, eyebrows, and eyelashes -changes in the color of fingernails -diarrhea -loosening of the fingernails -loss of appetite -muscle or joint pain -red flush to skin -sweating This list may not describe all possible side effects. Call your doctor for medical advice  about side effects. You may report side effects to FDA at 1-800-FDA-1088. Where should I keep my medicine? This drug is given in a hospital or clinic and will not be stored at home. NOTE: This sheet is a summary. It may not cover all possible information. If you have questions about this medicine, talk to your doctor, pharmacist, or health care  provider.  2012, Elsevier/Gold Standard. (12/25/2007 11:54:26 AM)

## 2012-08-29 ENCOUNTER — Ambulatory Visit (HOSPITAL_BASED_OUTPATIENT_CLINIC_OR_DEPARTMENT_OTHER): Payer: Medicaid Other

## 2012-08-29 ENCOUNTER — Telehealth: Payer: Self-pay | Admitting: Oncology

## 2012-08-29 VITALS — BP 136/86 | HR 80 | Temp 98.0°F

## 2012-08-29 DIAGNOSIS — C50919 Malignant neoplasm of unspecified site of unspecified female breast: Secondary | ICD-10-CM

## 2012-08-29 DIAGNOSIS — C50311 Malignant neoplasm of lower-inner quadrant of right female breast: Secondary | ICD-10-CM

## 2012-08-29 DIAGNOSIS — Z5189 Encounter for other specified aftercare: Secondary | ICD-10-CM

## 2012-08-29 MED ORDER — PEGFILGRASTIM INJECTION 6 MG/0.6ML
6.0000 mg | Freq: Once | SUBCUTANEOUS | Status: AC
  Administered 2012-08-29: 6 mg via SUBCUTANEOUS
  Filled 2012-08-29: qty 0.6

## 2012-08-31 ENCOUNTER — Encounter: Payer: Self-pay | Admitting: *Deleted

## 2012-08-31 NOTE — Progress Notes (Signed)
Clinical Child psychotherapist and patient completed financial assistance applications for Cancer Care, Patient Personal assistant, Help Now: Breast Cancer Charities, [employer], and [employer].  CSW mailed all completed applications.  Patient and CSW completed [place] [facility] [place] application, but needs healthcare provider portion completed, CSW will request provider to complete and then mail application.  Kathrin Penner, MSW, LCSW Clinical Social Worker [facility] [phone]

## 2012-09-04 ENCOUNTER — Ambulatory Visit: Payer: Medicaid Other | Admitting: Adult Health

## 2012-09-04 ENCOUNTER — Other Ambulatory Visit (HOSPITAL_BASED_OUTPATIENT_CLINIC_OR_DEPARTMENT_OTHER): Payer: Medicaid Other | Admitting: Lab

## 2012-09-04 ENCOUNTER — Telehealth: Payer: Self-pay | Admitting: Oncology

## 2012-09-04 ENCOUNTER — Other Ambulatory Visit: Payer: Medicaid Other | Admitting: Lab

## 2012-09-04 ENCOUNTER — Encounter: Payer: Self-pay | Admitting: Adult Health

## 2012-09-04 ENCOUNTER — Ambulatory Visit (HOSPITAL_BASED_OUTPATIENT_CLINIC_OR_DEPARTMENT_OTHER): Payer: Medicaid Other | Admitting: Adult Health

## 2012-09-04 ENCOUNTER — Telehealth: Payer: Self-pay | Admitting: *Deleted

## 2012-09-04 VITALS — BP 135/73 | HR 88 | Temp 98.3°F | Resp 20 | Ht 63.0 in | Wt 122.6 lb

## 2012-09-04 DIAGNOSIS — C50319 Malignant neoplasm of lower-inner quadrant of unspecified female breast: Secondary | ICD-10-CM

## 2012-09-04 DIAGNOSIS — C50311 Malignant neoplasm of lower-inner quadrant of right female breast: Secondary | ICD-10-CM

## 2012-09-04 DIAGNOSIS — D709 Neutropenia, unspecified: Secondary | ICD-10-CM

## 2012-09-04 LAB — COMPREHENSIVE METABOLIC PANEL (CC13)
ALT: 8 U/L (ref 0–55)
AST: 11 U/L (ref 5–34)
Albumin: 3.2 g/dL — ABNORMAL LOW (ref 3.5–5.0)
Calcium: 8.9 mg/dL (ref 8.4–10.4)
Chloride: 106 mEq/L (ref 98–109)
Potassium: 3.8 mEq/L (ref 3.5–5.1)
Sodium: 139 mEq/L (ref 136–145)

## 2012-09-04 LAB — CBC WITH DIFFERENTIAL/PLATELET
BASO%: 2.5 % — ABNORMAL HIGH (ref 0.0–2.0)
MCHC: 33.8 g/dL (ref 31.5–36.0)
MONO#: 0 10*3/uL — ABNORMAL LOW (ref 0.1–0.9)
RBC: 2.28 10*6/uL — ABNORMAL LOW (ref 3.70–5.45)
WBC: 0.8 10*3/uL — CL (ref 3.9–10.3)
lymph#: 0.3 10*3/uL — ABNORMAL LOW (ref 0.9–3.3)
nRBC: 0 % (ref 0–0)

## 2012-09-04 NOTE — Progress Notes (Signed)
OFFICE PROGRESS NOTE  CC  Jaime Edwards, MD [address] [place] [address] Dr. Everardo Murillo Dr. 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 Patient had a palpable mass at the 3:00 position. She was seen at the breast Center on 06/20/2012 she had a mammogram and ultrasound showing a 1.7 cm mass at the 3:00 position. 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 Tumor was grade 3.  #2 MRIs performed on 06/26/2012 showed no biopsy mass at 3:00 but also 2 other adjacent masses at 1 at 2:00 measuring 0.8 x 1.6 x 2.2 cm andalso 6 mm ill-defined mass posterior to the biopsy-proven malignancy.  #3 patient has gone on to have a Port-A-Cath placed to begin neoadjuvant chemotherapy initially consisting of Adriamycin Cytoxan to be given dose dense for 4 cycles followed by weekly Taxol and carboplatinum for a total of 12 weeks.carefully agent made to have an excellent response to neoadjuvant chemotherapy and could possibly have breast preservation of course she may even need chest mastectomy with sentinel lymph node biopsies. Patient and I had met at the [facility] clinic we discussed the rationale for upfront chemotherapy she is now here to receive that.    CURRENT THERAPY: cycle 4 day 8 of Adriamycin and Cytoxan  INTERVAL HISTORY: Jaime Murillo 52 y.o. female returns for evaluation following her fourth cycle of Adriamycin/Cytoxan.  She is doing well today.  She is neutropenic and anemic.  She denies fevers, chills, headache, palpitations, dyspnea, dyspnea on exertion, nausea, vomiting, fatigue, constipation, diarrhea, numbness or any other concerns.  A 10 point ROS is negative.     MEDICAL HISTORY: Past Medical History  Diagnosis Date  . HSV infection   . NSVD (normal spontaneous vaginal delivery)   . Hypertension     takes HCTZ  . Urinary frequency   . Anemia     takes Ferrous Sulfate occasionally  . Breast cancer 2014    triple negative    ALLERGIES:  has No Known Allergies.  MEDICATIONS:  Current Outpatient Prescriptions  Medication Sig Dispense Refill  . ciprofloxacin (CIPRO) 500 MG tablet Take 1 tablet (500 mg total) by mouth 2 (two) times daily.  14 tablet  5  . dexamethasone (DECADRON) 4 MG tablet Take 4 mg by mouth 2 (two) times daily with a meal. Take 2 tabs every day on the day after chemo an then take 2 tabs twice a day with food for 2 days.      . ferrous sulfate 325 (65 FE) MG tablet Take 325 mg by mouth daily with breakfast.      . hydrochlorothiazide (HYDRODIURIL) 25 MG tablet Take 1 tablet (25 mg total) by mouth daily.  30 tablet  11  . lidocaine-prilocaine (EMLA) cream Apply 1 application topically as needed (apply to PAC site 1-2 hours prior to treatment.).      Marland Kitchen LORazepam (ATIVAN) 0.5 MG tablet Take 0.5 mg by mouth every 6 (six) hours as needed for anxiety (as needed for nausea and vomitting).      . ondansetron (ZOFRAN) 8 MG tablet Take 8 mg by mouth every 8 (eight) hours as needed for nausea (Take 1 pill by mouth twice a day as needed  for nausea  and vomitting starting 3rd day after chemo).      Marland Kitchen oxyCODONE-acetaminophen (ROXICET) 5-325 MG per tablet Take 1-2 tablets by mouth every 4 (four) hours as needed for pain.  30 tablet  0  . prochlorperazine (COMPAZINE) 10 MG tablet Take 10 mg by mouth every 6 (six) hours as needed (for nausea and vomitting).      . prochlorperazine (COMPAZINE) 25 MG suppository Place 25 mg rectally every 12 (twelve) hours as needed for nausea (as needed for nausea and vomitting).      Marland Kitchen UNABLE TO FIND Apply 1 application topically once. 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  1 application  0  . valACYclovir (VALTREX) 500 MG tablet Take 500 mg by mouth daily as needed. For herpes       No current facility-administered medications for this visit.    SURGICAL HISTORY:  Past Surgical History  Procedure Laterality Date  . Tubal ligation  2003    LTL  . Portacath placement N/A 07/06/2012    Procedure: INSERTION PORT-A-CATH;  Surgeon: Almond Lint, MD;  Location: [facility] OR;  Service: General;  Laterality: N/A;    REVIEW OF SYSTEMS:  General: fatigue (-), night sweats (-), fever (-), pain (-) Lymph: palpable nodes (-) HEENT: vision changes (-), mucositis (-), gum bleeding (-), epistaxis (-) Cardiovascular: chest pain (-), palpitations (-) Pulmonary: shortness of breath (-), dyspnea on exertion (-), cough (-), hemoptysis (-) GI:  Early satiety (-), melena (-), dysphagia (-), nausea/vomiting (-), diarrhea (-) GU: dysuria (-), hematuria (-), incontinence (-) Musculoskeletal: joint swelling (-), joint pain (-), back pain (-) Neuro: weakness (-), numbness (-), headache (-), confusion (-) Skin: Rash (-), lesions (-), dryness (-) Psych: depression (-), suicidal/homicidal ideation (-), feeling of hopelessness (-)   PHYSICAL EXAMINATION: Blood pressure 135/73, pulse 88, temperature 98.3 F (36.8 C), temperature source Oral, resp. rate 20, height 5\' 3"  (1.6 m), weight 122 lb 9.6 oz (55.611 kg). Body mass index is 21.72 kg/(m^2). General: Patient is a well appearing female in no acute distress HEENT: PERRLA, sclerae anicteric no conjunctival pallor, MMM Neck: supple, no palpable adenopathy Lungs: clear to auscultation bilaterally, no wheezes, rhonchi, or rales Cardiovascular: regular rate rhythm, S1, S2, no murmurs, rubs or gallops Abdomen: Soft, non-tender, non-distended, normoactive bowel sounds, no HSM Extremities: warm and well perfused, no clubbing, cyanosis, or edema Skin: No rashes or lesions Neuro: Non-focal Breast examination: Left breast no  masses or nipple discharge Right breast reveals 3 discrete masses at the 3:00 position 2:00 position.  It is nontender there is no nipple discharge. Conglomeration of these masses to my exam measure about 2-3cm. ECOG PERFORMANCE STATUS: 0 - Asymptomatic  LABORATORY DATA: Lab Results  Component Value Date   WBC 0.8* 09/04/2012   HGB 7.1* 09/04/2012   HCT 21.0* 09/04/2012   MCV 92.1 09/04/2012   PLT 120* 09/04/2012      Chemistry      Component Value Date/Time   NA 139 09/04/2012 1452   NA 137 06/06/2012 0853   K 3.8 09/04/2012 1452   K 3.8 06/06/2012 0853   CL 104 07/17/2012 1357   CL 102 06/06/2012 0853   CO2 27 09/04/2012 1452   CO2 26 06/06/2012 0853   BUN 11.7 09/04/2012 1452   BUN 12 06/06/2012 0853   CREATININE 0.6 09/04/2012 1452   CREATININE 0.70 06/06/2012 0853   CREATININE 0.85 04/24/2009 1114      Component Value Date/Time  CALCIUM 8.9 09/04/2012 1452   CALCIUM 9.3 06/06/2012 0853   ALKPHOS 77 09/04/2012 1452   ALKPHOS 35* 06/06/2012 0853   AST 11 09/04/2012 1452   AST 14 06/06/2012 0853   ALT 8 09/04/2012 1452   ALT 9 06/06/2012 0853   BILITOT 0.20 09/04/2012 1452   BILITOT 0.8 06/06/2012 0853     ADDITIONAL INFORMATION: PROGNOSTIC INDICATORS - ACIS Results: IMMUNOHISTOCHEMICAL AND MORPHOMETRIC ANALYSIS BY THE AUTOMATED CELLULAR IMAGING SYSTEM (ACIS) Estrogen Receptor: 0%, NEGATIVE Progesterone Receptor: 0%, NEGATIVE Proliferation Marker Ki67: 95% COMMENT: The negative hormone receptor study(ies) in this case have an internal positive control. REFERENCE RANGE ESTROGEN RECEPTOR NEGATIVE <1% POSITIVE =>1% PROGESTERONE RECEPTOR NEGATIVE <1% POSITIVE =>1% All controls stained appropriately Pecola Leisure MD Pathologist, Electronic Signature ( Signed 06/28/2012) CHROMOGENIC IN-SITU HYBRIDIZATION Results: HER-2/NEU BY CISH - NO AMPLIFICATION OF HER-2 DETECTED. RESULT RATIO OF HER2: CEP 17 SIGNALS 1.26 1 of 3 Duplicate copy FINAL for KANESHIA, CATER  [phone]) ADDITIONAL INFORMATION:(continued) AVERAGE HER2 COPY NUMBER PER CELL 2.45 REFERENCE RANGE NEGATIVE HER2/Chr17 Ratio <2.0 and Average HER2 copy number <4.0 EQUIVOCAL HER2/Chr17 Ratio <2.0 and Average HER2 copy number 4.0 and <6.0 POSITIVE HER2/Chr17 Ratio >=2.0 and/or Average HER2 copy number >=6.0 Jimmy Picket MD Pathologist, Electronic Signature ( Signed 06/26/2012) FINAL DIAGNOSIS Diagnosis Breast, right, needle core biopsy, 3:00 position - INVASIVE DUCTAL CARCINOMA SEE COMMENT. - DUCTAL CARCINOMA IN SITU Microscopic Comment Although the grade of tumor is best assessed at resection, with these biopsies, both the invasive and in situ carcinoma are grade III. Breast prognostic studies are pending and will be reported in an addendum. The case is reviewed with Dr. Raynald Blend who concurs. (CRR:gt, 06/21/12) [place] RUND DO Pathologist, Electronic FINAL DIAGNOSIS Diagnosis Breast, right, needle core biopsy, mass - BENIGN BREAST TISSUE WITH FOCAL PSEUDOANGIOMATOUS STROMAL HYPERPLASIA (PASH). NO EVIDENCE OF MALIGNANCY. Microscopic Comment Called to the [facility] on 07/13/12. (JDP:caf 07/13/12)  RADIOGRAPHIC STUDIES:  Dg Chest 2 View  07/04/2012   *RADIOLOGY REPORT*  Clinical Data: Insertion of Port-A-Cath.  Breast cancer. Hypertension.  CHEST - 2 VIEW  Comparison: CT of the chest on 07/03/2012  Findings: Cardiomediastinal silhouette is within normal limits. The lungs are free of focal consolidations and pleural effusions. Bony structures have a normal appearance.  Contrast is identified within the colon following CT of the abdomen.  IMPRESSION: Negative exam.   Original Report Authenticated By: Norva Pavlov, M.D.   Ct Chest W Contrast  07/03/2012   *RADIOLOGY REPORT*  Clinical Data:  Breast cancer.  No therapy yet.  Lower inner quadrant of right breast.  CT CHEST, ABDOMEN AND PELVIS WITH CONTRAST  Technique: Contiguous axial images of the chest abdomen and pelvis  were obtained after IV contrast administration.  Contrast: 80 ml Omnipaque-300  Comparison: Breast MR of 06/26/2012.  No prior CTs.  CT CHEST  Findings: Lung windows demonstrate minimal left lower lobe subpleural nodularity at 3 mm on image 40/series 4.  Right lung clear.  Soft tissue windows demonstrate small bilateral axillary nodes, without adenopathy.  No subpectoral adenopathy. No supraclavicular adenopathy.  Normal heart size, without pericardial effusion.  No central pulmonary embolism, on this non-dedicated study.  No mediastinal or hilar adenopathy.  No internal mammary adenopathy. Minimal residual thymic tissue in the anterior mediastinum (image 17/series 2).  IMPRESSION:  1. No acute process or evidence of metastatic disease in the chest. 2.  Probable subpleural lymph node in the left lower lobe. Recommend attention on follow-up.  CT ABDOMEN AND PELVIS  Findings:  Mild early phase of hepatic enhancement which decreases sensitivity for focal liver lesions.  None identified.  Normal spleen, stomach, pancreas, gallbladder, biliary tract, adrenal glands, kidneys. No retroperitoneal or retrocrural adenopathy.  Normal colon, appendix, and terminal ileum.  Normal small bowel without abdominal ascites.  No pelvic adenopathy.    Normal urinary bladder and uterus.  No adnexal mass.  Trace free pelvic fluid is likely physiologic.  Transitional right-sided lumbosacral anatomy.  Degenerative disc disease at the lumbosacral junction.  IMPRESSION:  No acute process or evidence of metastatic disease in the abdomen or pelvis.   Original Report Authenticated By: Jeronimo Greaves, M.D.   US Breast Right  06/29/2012   *RADIOLOGY REPORT*  Clinical Data:  52 year old female with newly diagnosed right breast cancer.  Suspicious abnormal enhancement anterior and superior to the biopsy-proven neoplasm - for second look ultrasound evaluation.  RIGHT BREAST ULTRASOUND  Comparison:  Prior mammograms, ultrasounds and MRI.  Findings:  Ultrasound is performed, showing the biopsy-proven neoplasm at the 3 o'clock position of the right breast 4 cm from the nipple. No other discrete sonographic abnormalities are identified, specifically medial, superior, and anterior to the biopsy-proven neoplasm.  IMPRESSION: Patchy abnormal enhancement medial superior to the biopsy-proven neoplasm identified on recent MRI is not visualized sonographically as a discrete abnormality.  MR guided biopsy is recommended if breast conservation surgery is considered.  BI-RADS CATEGORY 6:  Known biopsy-proven malignancy - appropriate action should be taken.  RECOMMENDATION: MR guided right breast biopsy, which will be scheduled by our office.  I have discussed the findings and recommendations with the patient. Results were also provided in writing at the conclusion of the visit.  If applicable, a reminder letter will be sent to the patient regarding the next appointment.   Original Report Authenticated By: Harmon Pier, M.D.   US Breast Right  06/20/2012   *RADIOLOGY REPORT*  Clinical Data:  52 year old female for annual bilateral mammograms and palpable mass in the upper and inner right breast discovered on clinical examination.  DIGITAL DIAGNOSTIC BILATERAL MAMMOGRAM WITH CAD AND RIGHT BREAST ULTRASOUND:  Comparison:  03/21/2009 and prior mammograms dating back to 01/21/2005  Findings:  ACR Breast Density Category 4: The breast tissue is extremely dense.  Routine and spot compression views of the right breast demonstrate no evidence of suspicious mass or distortion. Scattered punctate calcifications within the breasts bilaterally are again identified.  Mammographic images were processed with CAD.  On physical exam, thickening is identified at the 12 o'clock position of the right breast 4 cm from the nipple. A firm palpable mass identified at the 3 o'clock position of the right breast 4 cm from the nipple.  Ultrasound is performed, showing no evidence of solid or cystic  mass, distortion or abnormal areas of shadowing at the 12 o'clock position of the right breast. A 7 x 6 x 17 mm slightly irregular oval hypoechoic mass with calcifications is horizontally oriented at the 3 o'clock position of the right breast 4 cm from the nipple.  IMPRESSION: Indeterminate 7 x 6 x 17 mm hypoechoic mass at the 3 o'clock position of the right breast.  Tissue sampling is recommended.  No mammographic evidence of breast malignancy bilaterally.  No mammographic, palpable or sonographic abnormality at the 12 o'clock position of the right breast, in the area of palpable concern.  BI-RADS CATEGORY 4:  Suspicious abnormality - biopsy should be considered.  RECOMMENDATION:  Ultrasound guided right breast biopsy, which will be performed today but dictated  in a separate report.  I have discussed the findings and recommendations with the patient. Results were also provided in writing at the conclusion of the visit.  If applicable, a reminder letter will be sent to the patient regarding the next appointment.   Original Report Authenticated By: Harmon Pier, M.D.   Ct Abdomen Pelvis W Contrast  07/03/2012   *RADIOLOGY REPORT*  Clinical Data:  Breast cancer.  No therapy yet.  Lower inner quadrant of right breast.  CT CHEST, ABDOMEN AND PELVIS WITH CONTRAST  Technique: Contiguous axial images of the chest abdomen and pelvis were obtained after IV contrast administration.  Contrast: 80 ml Omnipaque-300  Comparison: Breast MR of 06/26/2012.  No prior CTs.  CT CHEST  Findings: Lung windows demonstrate minimal left lower lobe subpleural nodularity at 3 mm on image 40/series 4.  Right lung clear.  Soft tissue windows demonstrate small bilateral axillary nodes, without adenopathy.  No subpectoral adenopathy. No supraclavicular adenopathy.  Normal heart size, without pericardial effusion.  No central pulmonary embolism, on this non-dedicated study.  No mediastinal or hilar adenopathy.  No internal mammary adenopathy.  Minimal residual thymic tissue in the anterior mediastinum (image 17/series 2).  IMPRESSION:  1. No acute process or evidence of metastatic disease in the chest. 2.  Probable subpleural lymph node in the left lower lobe. Recommend attention on follow-up.  CT ABDOMEN AND PELVIS  Findings:  Mild early phase of hepatic enhancement which decreases sensitivity for focal liver lesions.  None identified.  Normal spleen, stomach, pancreas, gallbladder, biliary tract, adrenal glands, kidneys. No retroperitoneal or retrocrural adenopathy.  Normal colon, appendix, and terminal ileum.  Normal small bowel without abdominal ascites.  No pelvic adenopathy.    Normal urinary bladder and uterus.  No adnexal mass.  Trace free pelvic fluid is likely physiologic.  Transitional right-sided lumbosacral anatomy.  Degenerative disc disease at the lumbosacral junction.  IMPRESSION:  No acute process or evidence of metastatic disease in the abdomen or pelvis.   Original Report Authenticated By: Jeronimo Greaves, M.D.   Mr Breast Right W Wo Contrast  07/12/2012   *RADIOLOGY REPORT*  Clinical Data:  Known invasive mammary carcinoma in the right breast at 3 o'clock posteriorly.  Additional site of abnormal enhancement in the right upper inner quadrant closer to the nipple for possible biopsy.  ATTEMPTED MRI GUIDED VACUUM ASSISTED BIOPSY OF THE RIGHT BREAST WITHOUT AND WITH CONTRAST  Comparison: Previous exams.  Technique: Multiplanar, multisequence MR images of the right breast were obtained prior to and following the intravenous administration of 9 ml of Mulithance.  I met with the patient, and we discussed the procedure of MRI guided biopsy, including risks, benefits, and alternatives. Specifically, we discussed the risks of infection, bleeding, tissue injury, clip migration, and inadequate sampling.  Informed, written consent was given.  Using sterile technique, 2% Lidocaine, MRI guidance, and a 9 gauge vacuum assisted device, biopsy was  attempted using a mediolateral approach.  However, the procedure was unsuccessful due to the extremely thin breast compression.  IMPRESSION:  Unsuccessful MRI guided vacuum-assisted biopsy of the right breast.  Patient was sent to the [facility] for further evaluation with ultrasound.   Original Report Authenticated By: Cain Saupe, M.D.   Mr Breast Bilateral W Wo Contrast  06/26/2012   *RADIOLOGY REPORT*  Clinical Data: History of new diagnosis of breast cancer from recent ultrasound guided core needle biopsy right breast 06/20/2012 demonstrating invasive ductal carcinoma with DCIS.  GFR 89, creatinine 0.7 on 06/06/2012.  BILATERAL BREAST MRI WITH AND WITHOUT CONTRAST  Technique: Multiplanar, multisequence MR images of both breasts were obtained prior to and following the intravenous administration of 10ml of multihance.  Three dimensional images were evaluated at the independent DynaCad workstation.  Comparison:  Recent mammograms and ultrasound.  Findings: Examination demonstrates mild background parenchymal enhancement.  Right breast:  There is evidence of an ovoid gently lobulated and somewhat ill-defined heterogeneously enhancing mass over the deep third of the inner midportion of the right breast representing patient's biopsy-proven malignancy.  This mass contains central clip artifact and measures approximately 0.9 x 1.9 x 1.0 cm. The anterior border of this mass is located 5.5 cm from the nipple. There is a 6 mm slightly ill-defined enhancing ovoid mass 6 mm posterior lateral to the biopsy-proven malignancy lying just superficial to the pectoralis muscle.  There is also clumped non mass enhancement anterior and slightly superior to the biopsy- proven malignancy in the middle third at approximately the 2 o'clock position.  This measures approximately 0.8 x 1.6 x 1.2 cm and is located 3 cm from the nipple.  This is suspicious for multifocal disease.  No suspicious axillary or internal mammary lymph  nodes.  Left breast:  No suspicious mass, enhancement or adenopathy.  IMPRESSION: Known biopsy-proven malignancy over the deep third of the inner mid right breast.  6 mm ill-defined enhancing ovoid mass 6 mm posterior lateral to the biopsy-proven malignancy and lying just superficial to the pectoralis muscle.  Clumped non mass enhancement anterior and superior to patient's known malignancy at approximately the 2 o'clock position measuring 0.8 x 1.6 x 2.2 cm.  Findings likely represent multi focal disease.  RECOMMENDATION: Recommend second look ultrasound and core biopsy if feasible of the clumped non mass enhancement at approximately the 2 o'clock position right breast anterior/superior to the known malignancy. If not seen by ultrasound, recommend proceeding to MR guided biopsy of this suspicious abnormality.  THREE-DIMENSIONAL MR IMAGE RENDERING ON INDEPENDENT WORKSTATION:  Three-dimensional MR images were rendered by post-processing of the original MR data on an independent workstation.  The three- dimensional MR images were interpreted, and findings were reported in the accompanying complete MRI report for this study.  BI-RADS CATEGORY 0:  Incomplete.  Need additional imaging evaluation and/or prior mammograms for comparison.   Original Report Authenticated By: Elberta Fortis, M.D.   Dg Chest Port 1 View  07/06/2012   *RADIOLOGY REPORT*  Clinical Data: Post left-sided port catheter insertion  PORTABLE CHEST - 1 VIEW  Comparison: 07/04/2012  Findings:  Grossly unchanged cardiac silhouette and mediastinal contours. Interval placement of a left anterior chest wall subclavian vein approach port-a-catheter with tip projecting over the superior cavoatrial junction. Grossly unchanged symmetric biapical pleural parenchymal thickening.  No pneumothorax.  No focal airspace opacities.  No pleural effusion.  No evidence of edema.  Unchanged bones.  IMPRESSION: Interval placement of a left anterior chest wall subclavian vein  approach port-a-catheter with tip overlying the superior cavoatrial junction.  No pneumothorax.   Original Report Authenticated By: Tacey Ruiz, MD   Mm Digital Diagnostic Bilat  06/20/2012   *RADIOLOGY REPORT*  Clinical Data:  52 year old female for annual bilateral mammograms and palpable mass in the upper and inner right breast discovered on clinical examination.  DIGITAL DIAGNOSTIC BILATERAL MAMMOGRAM WITH CAD AND RIGHT BREAST ULTRASOUND:  Comparison:  03/21/2009 and prior mammograms dating back to 01/21/2005  Findings:  ACR Breast Density Category 4: The breast tissue is extremely dense.  Routine and spot compression  views of the right breast demonstrate no evidence of suspicious mass or distortion. Scattered punctate calcifications within the breasts bilaterally are again identified.  Mammographic images were processed with CAD.  On physical exam, thickening is identified at the 12 o'clock position of the right breast 4 cm from the nipple. A firm palpable mass identified at the 3 o'clock position of the right breast 4 cm from the nipple.  Ultrasound is performed, showing no evidence of solid or cystic mass, distortion or abnormal areas of shadowing at the 12 o'clock position of the right breast. A 7 x 6 x 17 mm slightly irregular oval hypoechoic mass with calcifications is horizontally oriented at the 3 o'clock position of the right breast 4 cm from the nipple.  IMPRESSION: Indeterminate 7 x 6 x 17 mm hypoechoic mass at the 3 o'clock position of the right breast.  Tissue sampling is recommended.  No mammographic evidence of breast malignancy bilaterally.  No mammographic, palpable or sonographic abnormality at the 12 o'clock position of the right breast, in the area of palpable concern.  BI-RADS CATEGORY 4:  Suspicious abnormality - biopsy should be considered.  RECOMMENDATION:  Ultrasound guided right breast biopsy, which will be performed today but dictated in a separate report.  I have discussed the  findings and recommendations with the patient. Results were also provided in writing at the conclusion of the visit.  If applicable, a reminder letter will be sent to the patient regarding the next appointment.   Original Report Authenticated By: Harmon Pier, M.D.   Mm Digital Diagnostic Unilat R  07/12/2012   *RADIOLOGY REPORT*  Clinical Data:  Ultrasound-guided core needle biopsy of a hypoechoic area at 2 o'clock 2 cm from the right nipple with clip placement.  Known invasive mammary carcinoma in the right breast. Unsuccessful right breast MRI guided biopsy earlier today.  DIGITAL DIAGNOSTIC RIGHT MAMMOGRAM  Comparison:  Previous exams.  Findings:  Films are performed following ultrasound guided biopsy of a small hypoechoic area at 2 o'clock 2 cm from the right nipple. The ribbon clip is positioned within the area of concern. The ribbon clip is approximately 2.6 cm anterior to the top hat clip from the original biopsy on 06/20/2012.  IMPRESSION: Appropriate clip placement following ultrasound-guided core needle biopsy of a hypoechoic area at 2 o'clock 2 cm in the right nipple.   Original Report Authenticated By: Cain Saupe, M.D.   Mm Digital Diagnostic Unilat R  06/20/2012   *RADIOLOGY REPORT*  Clinical Data:  52 year old female - evaluate clip placement following ultrasound guided right breast biopsy.  DIGITAL DIAGNOSTIC RIGHT MAMMOGRAM  Comparison:  Previous exams.  Findings:  Films are performed following ultrasound guided biopsy of the 7 x 6 x 17 mm hypoechoic mass at the 3 o'clock position of the right breast 4 cm from the nipple. The T shaped biopsy clip is in satisfactory position.  IMPRESSION: Satisfactory clip placement following ultrasound guided right breast biopsy.  Pathology will be followed.   Original Report Authenticated By: Harmon Pier, M.D.   Dg Fluoro Guide Cv Line-no Report  07/06/2012   CLINICAL DATA: port placement   FLOURO GUIDE CV LINE  Fluoroscopy was utilized by the requesting  physician.  No radiographic  interpretation.    [place] Rt Breast Bx W Loc Dev 1st Lesion Img Bx Spec US Guide  07/13/2012   **ADDENDUM** CREATED: 07/13/2012 14:11:45  I spoke with the patient by telephone on 07/13/2012 to discuss pathology results.  Pathology demonstrates  pseudoangiomatous stromal  hyperplasia.  The findings were called to the office of Dr. Donell Beers. Although the imaging findings could be related to pseudoangiomatous stromal hyperplasia, excision of the anterior biopsy site is suggested at the time of patient's definitive surgery.  The patient is considering unilateral or bilateral mastectomy at this time.  The patient reports no problems at the biopsy site.  All questions were answered.  Recommendations:  Excision of the biopsy site. The patient is scheduled to begin chemotherapy in 4 days.  **END ADDENDUM** SIGNED BY: Blair Hailey. Manson Passey, M.D.  07/12/2012   *RADIOLOGY REPORT*  Clinical Data:  Unsuccessful MRI guided biopsy of the area of abnormal enhancement in the left anterior upper inner quadrant, anterior to the known invasive mammary carcinoma.  Second look ultrasound performed and a 5 x 4 x 4 mm hypoechoic area found at 2 o'clock 2 cm from the right nipple which will be biopsied.  ULTRASOUND GUIDED VACUUM ASSISTED CORE BIOPSY OF THE RIGHT BREAST  Sonography of the right upper inner quadrant was performed.  There is an ill-defined hypoechoic 5 x 4 x 4 mm area at 2 o'clock 2 cm from the right nipple which will be biopsied with ultrasound guidance.  The patient and I discussed the procedure of ultrasound-guided biopsy, including benefits and alternatives.  We discussed the high likelihood of a successful procedure. We discussed the risks of the procedure including infection, bleeding, tissue injury, clip migration, and inadequate sampling.  Written informed consent was given.  Using sterile technique, 2% lidocaine, ultrasound guidance, and a 12 gauge vacuum assisted needle, biopsy was performed of  the hypoechoic area at 2 o'clock 2 cm from the right nipple using a caudocranial approach.  At the conclusion of the procedure, a within tissue marker clip was deployed into the biopsy cavity. Follow-up 2-view mammogram was performed and dictated separately.  IMPRESSION: Ultrasound-guided biopsy of a 5 x 4 x 4 mm hypoechoic area at 2 o'clock 2 cm from the right nipple.  No apparent complications.   Original Report Authenticated By: Cain Saupe, M.D.   [place] Rt Breast Bx W Loc Dev 1st Lesion Img Bx Spec US Guide  06/21/2012   *RADIOLOGY REPORT*  Clinical Data:  Indeterminate 7 x 6 x 17 mm hypoechoic mass in the inner right breast - for tissue sampling.  ULTRASOUND GUIDED VACUUM ASSISTED CORE BIOPSY OF THE RIGHT BREAST  Comparison: Previous exams.  I met with the patient and we discussed the procedure of ultrasound- guided biopsy, including benefits and alternatives.  We discussed the high likelihood of a successful procedure. We discussed the risks of the procedure including infection, bleeding, tissue injury, clip migration, and inadequate sampling.  Informed written consent was given.  Using sterile technique, 2% lidocaine ultrasound guidance and a 12 gauge vacuum assisted needle biopsy was performed of the 7 x 6 x 17 mm hypoechoic mass at the 3 o'clock position of the right breast 4 cm from the nipple using a lateral approach.  At the conclusion of the procedure, a T shaped tissue marker clip was deployed into the biopsy cavity.  Follow-up 2-view mammogram was performed and dictated separately.  IMPRESSION: Ultrasound-guided biopsy of right breast mass.  No apparent complications.  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 Histology correlates with imaging findings.  The patient was contacted by phone on 06/21/2012 and these results given to her which she understood. Her questions were answered. She was encouraged to obtain breast cancer educational material from [facility] at her  convenience. The patient had no complaints with her biopsy site.  Recommend surgery/oncology consultation.  An appointment at the [facility] has been scheduled for 06/28/2012. Recommend bilateral breast MRI, which has been scheduled for 06/26/2012. The patient was informed of these appointments.   Original Report Authenticated By: Harmon Pier, M.D.    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 Patient had other areas seen on MRI biopsy of the second mass was negative. Patient has had her Port-A-Cath placed. She is proceeding neoadjuvant chemotherapy to possibly conserve her breast. Chemotherapy will consist of Adriamycin Cytoxan given dose dense for 4 cycles starting today 07/17/2012. She will receive Neulasta support on day 2 for each cycle. When she completes this then she will begin weekly Taxol and carboplatinum for a total of 12 weeks.  #2 we discussed risks benefits and side effects of chemotherapy. She has had an echocardiogram as well as chemotherapy teaching class. Port-A-Cath was placed by Dr. Donell Beers. The port is functioning well.  #3 Currently receiving Adriamycin/Cytoxan cycle 4 day 8    PLAN:  #1 Jaime Murillo is doing relatively well today.  She is neutropenic.  I reviewed neutropenic precautions with her in detail and gave her written instructions.  She will have her Cipro refilled and start taking today.    #2 She is anemic with a hemoglobin of 7.1.  She is asymptomatic.  She and I discussed the signs and symptoms of anemia and I gave her a written handout of these symptoms.  Should they occur or worsen, she knows to call and we will get her in for a blood transfusion.  This was discussed with Dr. Welton Flakes in detail.    #3 She will return next week for her first cycle of Taxol/Carbo.    All questions were answered. The patient knows to call the clinic with any problems,  questions or concerns. We can certainly see the patient much sooner if necessary.  I spent 25 minutes counseling the patient face to face. The total time spent in the appointment was 30 minutes.   Cherie Ouch Lyn Hollingshead, NP Medical Oncology [facility] Phone: [phone]   09/07/2012, 12:16 AM

## 2012-09-04 NOTE — Patient Instructions (Addendum)
Patient Neutropenia Instruction Sheet  Diagnosis: Breast Cancer      Treating Physician: Drue Second, MD  Treatment: 1. Type of chemotherapy: Adriamycin/Cytoxan 2. Date of last treatment: 08/28/12  Last Blood Counts: Lab Results  Component Value Date   WBC 0.8* 09/04/2012   HGB 7.1* 09/04/2012   HCT 21.0* 09/04/2012   MCV 92.1 09/04/2012   PLT 120* 09/04/2012  ANC 400     Prophylactic Antibiotics: Cipro 500 mg by mouth twice a day Instructions: 1. Monitor temperature and call if fever  greater than 100.5, chills, shaking chills (rigors) 2. Call Physician on-call at [phone] 3. Give him/her symptoms and list of medications that you are taking and your last blood count.   Soak nails in tea tree oil.    Anemia, Frequently Asked Questions WHAT ARE THE SYMPTOMS OF ANEMIA?  Headache.  Difficulty thinking.  Fatigue.  Shortness of breath.  Weakness.  Rapid heartbeat. AT WHAT POINT ARE PEOPLE CONSIDERED ANEMIC?  This varies with gender and age.   Both hemoglobin (Hgb) and hematocrit values are used to define anemia. These lab values are obtained from a complete blood count (CBC) test. This is performed at a caregiver's office.  The normal range of hemoglobin values for adult men is 14.0 g/dL to 09.8 g/dL. For nonpregnant women, values are 12.3 g/dL to 11.9 g/dL.  The World Health Organization defines anemia as less than 12 g/dL for nonpregnant women and less than 13 g/dL for men.  For adult males, the average normal hematocrit is 46%, and the range is 40% to 52%.  For adult females, the average normal hematocrit is 41%, and the range is 35% to 47%.  Values that fall below the lower limits can be a sign of anemia and should have further checking (evaluation). GROUPS OF PEOPLE WHO ARE AT RISK FOR DEVELOPING ANEMIA INCLUDE:   Infants who are breastfed or taking a formula that is not fortified with iron.  Children going through a rapid growth spurt. The iron available  can not keep up with the needs for a red cell mass which must grow with the child.  Women in childbearing years. They need iron because of blood loss during menstruation.  Pregnant women. The growing fetus creates a high demand for iron.  People with ongoing gastrointestinal blood loss are at risk of developing iron deficiency.  Individuals with leukemia or cancer who must receive chemotherapy or radiation to treat their disease. The drugs or radiation used to treat these diseases often decreases the bone marrow's ability to make cells of all classes. This includes red blood cells, white blood cells, and platelets.  Individuals with chronic inflammatory conditions such as rheumatoid arthritis or chronic infections.  The elderly. ARE SOME TYPES OF ANEMIA INHERITED?   Yes, some types of anemia are due to inherited or genetic defects.  Sickle cell anemia. This occurs most often in people of African, African American, and Mediterranean descent.  Thalassemia (or Cooley's anemia). This type is found in people of Mediterranean and Southeast Asian descent. These types of anemia are common.  [place]. This is rare. CAN CERTAIN MEDICATIONS CAUSE A PERSON TO BECOME ANEMIC?  Yes. For example, drugs to fight cancer (chemotherapeutic agents) often cause anemia. These drugs can slow the bone marrow's ability to make red blood cells. If there are not enough red blood cells, the body does not get enough oxygen. WHAT HEMATOCRIT LEVEL IS REQUIRED TO DONATE BLOOD?  The lower limit of an acceptable hematocrit for blood  donors is 38%. If you have a low hematocrit value, you should schedule an appointment with your caregiver. ARE BLOOD TRANSFUSIONS COMMONLY USED TO CORRECT ANEMIA, AND ARE THEY DANGEROUS?  They are used to treat anemia as a last resort. Your caregiver will find the cause of the anemia and correct it if possible. Most blood transfusions are given because of excessive bleeding at the time of  surgery, with trauma, or because of bone marrow suppression in patients with cancer or leukemia on chemotherapy. Blood transfusions are safer than ever before. We also know that blood transfusions affect the immune system and may increase certain risks. There is also a concern for human error. In 1/16,000 transfusions, a patient receives a transfusion of blood that is not matched with his or her blood type.  WHAT IS IRON DEFICIENCY ANEMIA AND CAN I CORRECT IT BY CHANGING MY DIET?  Iron is an essential part of hemoglobin. Without enough hemoglobin, anemia develops and the body does not get the right amount of oxygen. Iron deficiency anemia develops after the body has had a low level of iron for a long time. This is either caused by blood loss, not taking in or absorbing enough iron, or increased demands for iron (like pregnancy or rapid growth).  Foods from animal origin such as beef, chicken, and pork, are good sources of iron. Be sure to have one of these foods at each meal. Vitamin C helps your body absorb iron. Foods rich in Vitamin C include citrus, bell pepper, strawberries, spinach and cantaloupe. In some cases, iron supplements may be needed in order to correct the iron deficiency. In the case of poor absorption, extra iron may have to be given directly into the vein through a needle (intravenously). I HAVE BEEN DIAGNOSED WITH IRON DEFICIENCY ANEMIA AND MY CAREGIVER PRESCRIBED IRON SUPPLEMENTS. HOW LONG WILL IT TAKE FOR MY BLOOD TO BECOME NORMAL?  It depends on the degree of anemia at the beginning of treatment. Most people with mild to moderate iron deficiency, anemia will correct the anemia over a period of 2 to 3 months. But after the anemia is corrected, the iron stored by the body is still low. Caregivers often suggest an additional 6 months of oral iron therapy once the anemia has been reversed. This will help prevent the iron deficiency anemia from quickly happening again. Non-anemic adult males  should take iron supplements only under the direction of a doctor, too much iron can cause liver damage.  MY HEMOGLOBIN IS 9 G/DL AND I AM SCHEDULED FOR SURGERY. SHOULD I POSTPONE THE SURGERY?  If you have Hgb of 9, you should discuss this with your caregiver right away. Many patients with similar hemoglobin levels have had surgery without problems. If minimal blood loss is expected for a minor procedure, no treatment may be necessary.  If a greater blood loss is expected for more extensive procedures, you should ask your caregiver about being treated with erythropoietin and iron. This is to accelerate the recovery of your hemoglobin to a normal level before surgery. An anemic patient who undergoes high-blood-loss surgery has a greater risk of surgical complications and need for a blood transfusion, which also carries some risk.  I HAVE BEEN TOLD THAT HEAVY MENSTRUAL PERIODS CAUSE ANEMIA. IS THERE ANYTHING I CAN DO TO PREVENT THE ANEMIA?  Anemia that results from heavy periods is usually due to iron deficiency. You can try to meet the increased demands for iron caused by the heavy monthly blood loss by  increasing the intake of iron-rich foods. Iron supplements may be required. Discuss your concerns with your caregiver. WHAT CAUSES ANEMIA DURING PREGNANCY?  Pregnancy places major demands on the body. The mother must meet the needs of both her body and her growing baby. The body needs enough iron and folate to make the right amount of red blood cells. To prevent anemia while pregnant, the mother should stay in close contact with her caregiver.  Be sure to eat a diet that has foods rich in iron and folate like liver and dark green leafy vegetables. Folate plays an important role in the normal development of a baby's spinal cord. Folate can help prevent serious disorders like spina bifida. If your diet does not provide adequate nutrients, you may want to talk with your caregiver about nutritional supplements.   WHAT IS THE RELATIONSHIP BETWEEN FIBROID TUMORS AND ANEMIA IN WOMEN?  The relationship is usually caused by the increased menstrual blood loss caused by fibroids. Good iron intake may be required to prevent iron deficiency anemia from developing.  Document Released: 08/20/2003 Document Revised: 04/05/2011 Document Reviewed: 02/03/2010 [facility] Patient Information 2014 [place], [place].

## 2012-09-04 NOTE — Telephone Encounter (Signed)
Per staff phone call and POF I have schedueld appts.  JMW  

## 2012-09-04 NOTE — Telephone Encounter (Signed)
, °

## 2012-09-05 ENCOUNTER — Telehealth: Payer: Self-pay | Admitting: *Deleted

## 2012-09-05 NOTE — Telephone Encounter (Signed)
Per desk RN I have scheduled appts for tomorrow. Desk RN to place POF in computer

## 2012-09-05 NOTE — Telephone Encounter (Signed)
Pt to be typed and crossed 09/06/12 and receive 2 units. appt at 1200. Called pt and advised new appt 1145 09/06/12. Pt requested I call her sister French Ana and explain transfusion appt as she has been forgetting appt dates and times. Called Tracey informed her of pt appt/date time. Tracey verbalized understanding said pt has been really stressed and that is why she is forgetting things. No further concerns.

## 2012-09-05 NOTE — Telephone Encounter (Signed)
Pt called states " I woke up with a headache and took 2 advil. Mardella Layman said to call today if anything happened. My iron studies were low when I had labs " Review with [place], pt will need to have transfusion 2 units. Pt confirmed she can come today or this week for transfusion. Call to infusion for an appt.

## 2012-09-06 ENCOUNTER — Ambulatory Visit (HOSPITAL_BASED_OUTPATIENT_CLINIC_OR_DEPARTMENT_OTHER): Payer: Medicaid Other

## 2012-09-06 ENCOUNTER — Other Ambulatory Visit: Payer: Medicaid Other | Admitting: Lab

## 2012-09-06 ENCOUNTER — Other Ambulatory Visit: Payer: Self-pay | Admitting: *Deleted

## 2012-09-06 ENCOUNTER — Encounter (HOSPITAL_COMMUNITY)
Admission: RE | Admit: 2012-09-06 | Discharge: 2012-09-06 | Disposition: A | Discharge status: Home / Self Care | Payer: Medicaid Other | Source: Ambulatory Visit | Attending: Oncology | Admitting: Oncology

## 2012-09-06 VITALS — BP 121/69 | HR 84 | Temp 98.4°F | Resp 20

## 2012-09-06 DIAGNOSIS — C50311 Malignant neoplasm of lower-inner quadrant of right female breast: Secondary | ICD-10-CM

## 2012-09-06 DIAGNOSIS — D649 Anemia, unspecified: Secondary | ICD-10-CM

## 2012-09-06 DIAGNOSIS — C50319 Malignant neoplasm of lower-inner quadrant of unspecified female breast: Secondary | ICD-10-CM | POA: Insufficient documentation

## 2012-09-06 LAB — ABO/RH: ABO/RH(D): O POS

## 2012-09-06 LAB — PREPARE RBC (CROSSMATCH)

## 2012-09-06 MED ORDER — SODIUM CHLORIDE 0.9 % IJ SOLN
10.0000 mL | INTRAMUSCULAR | Status: AC | PRN
  Administered 2012-09-06: 10 mL
  Filled 2012-09-06: qty 10

## 2012-09-06 MED ORDER — HEPARIN SOD (PORK) LOCK FLUSH 100 UNIT/ML IV SOLN
500.0000 [IU] | Freq: Every day | INTRAVENOUS | Status: AC | PRN
  Administered 2012-09-06: 500 [IU]
  Filled 2012-09-06: qty 5

## 2012-09-06 MED ORDER — SODIUM CHLORIDE 0.9 % IV SOLN
250.0000 mL | Freq: Once | INTRAVENOUS | Status: AC
  Administered 2012-09-06: 250 mL via INTRAVENOUS

## 2012-09-06 MED ORDER — DIPHENHYDRAMINE HCL 25 MG PO CAPS
25.0000 mg | ORAL_CAPSULE | Freq: Once | ORAL | Status: AC
  Administered 2012-09-06: 25 mg via ORAL

## 2012-09-06 MED ORDER — ACETAMINOPHEN 325 MG PO TABS
650.0000 mg | ORAL_TABLET | Freq: Once | ORAL | Status: AC
  Administered 2012-09-06: 650 mg via ORAL

## 2012-09-06 NOTE — Patient Instructions (Signed)
Blood Transfusion  A blood transfusion replaces your blood or some of its parts. Blood is replaced when you have lost blood because of surgery, an accident, or for severe blood conditions like anemia. You can donate blood to be used on yourself if you have a planned surgery. If you lose blood during that surgery, your own blood can be given back to you. Any blood given to you is checked to make sure it matches your blood type. Your temperature, blood pressure, and heart rate (vital signs) will be checked often.  GET HELP RIGHT AWAY IF:   You feel sick to your stomach (nauseous) or throw up (vomit).  You have watery poop (diarrhea).  You have shortness of breath or trouble breathing.  You have blood in your pee (urine) or have dark colored pee.  You have chest pain or tightness.  Your eyes or skin turn yellow (jaundice).  You have a temperature by mouth above 102 F (38.9 C), not controlled by medicine.  You start to shake and have chills.  You develop a a red rash (hives) or feel itchy.  You develop lightheadedness or feel confused.  You develop back, joint, or muscle pain.  You do not feel hungry (lost appetite).  You feel tired, restless, or nervous.  You develop belly (abdominal) cramps. Document Released: 04/09/2008 Document Revised: 04/05/2011 Document Reviewed: 04/09/2008 [employer] Patient Information 2014 [employer].  

## 2012-09-07 ENCOUNTER — Encounter: Payer: Self-pay | Admitting: Oncology

## 2012-09-07 LAB — TYPE AND SCREEN
ABO/RH(D): O POS
Antibody Screen: NEGATIVE
Unit division: 0

## 2012-09-11 ENCOUNTER — Other Ambulatory Visit (HOSPITAL_BASED_OUTPATIENT_CLINIC_OR_DEPARTMENT_OTHER): Payer: Medicaid Other | Admitting: Lab

## 2012-09-11 ENCOUNTER — Ambulatory Visit (HOSPITAL_BASED_OUTPATIENT_CLINIC_OR_DEPARTMENT_OTHER): Payer: Medicaid Other | Admitting: Adult Health

## 2012-09-11 ENCOUNTER — Ambulatory Visit (HOSPITAL_BASED_OUTPATIENT_CLINIC_OR_DEPARTMENT_OTHER): Payer: Medicaid Other

## 2012-09-11 ENCOUNTER — Telehealth: Payer: Self-pay | Admitting: *Deleted

## 2012-09-11 ENCOUNTER — Encounter: Payer: Self-pay | Admitting: Adult Health

## 2012-09-11 VITALS — BP 129/79 | HR 81 | Temp 97.4°F | Resp 18 | Ht 63.0 in | Wt 126.2 lb

## 2012-09-11 VITALS — BP 146/92 | HR 68 | Temp 97.7°F | Resp 20

## 2012-09-11 DIAGNOSIS — Z5111 Encounter for antineoplastic chemotherapy: Secondary | ICD-10-CM

## 2012-09-11 DIAGNOSIS — K121 Other forms of stomatitis: Secondary | ICD-10-CM

## 2012-09-11 DIAGNOSIS — C50919 Malignant neoplasm of unspecified site of unspecified female breast: Secondary | ICD-10-CM

## 2012-09-11 DIAGNOSIS — C50311 Malignant neoplasm of lower-inner quadrant of right female breast: Secondary | ICD-10-CM

## 2012-09-11 DIAGNOSIS — C50319 Malignant neoplasm of lower-inner quadrant of unspecified female breast: Secondary | ICD-10-CM

## 2012-09-11 DIAGNOSIS — Z171 Estrogen receptor negative status [ER-]: Secondary | ICD-10-CM

## 2012-09-11 LAB — CBC WITH DIFFERENTIAL/PLATELET
BASO%: 0.5 % (ref 0.0–2.0)
Eosinophils Absolute: 0 10*3/uL (ref 0.0–0.5)
HCT: 32.8 % — ABNORMAL LOW (ref 34.8–46.6)
LYMPH%: 10.5 % — ABNORMAL LOW (ref 14.0–49.7)
MCHC: 33.5 g/dL (ref 31.5–36.0)
MONO#: 1.2 10*3/uL — ABNORMAL HIGH (ref 0.1–0.9)
NEUT#: 8.6 10*3/uL — ABNORMAL HIGH (ref 1.5–6.5)
NEUT%: 78.4 % — ABNORMAL HIGH (ref 38.4–76.8)
Platelets: 243 10*3/uL (ref 145–400)
RBC: 3.54 10*6/uL — ABNORMAL LOW (ref 3.70–5.45)
WBC: 11 10*3/uL — ABNORMAL HIGH (ref 3.9–10.3)
lymph#: 1.2 10*3/uL (ref 0.9–3.3)
nRBC: 0 % (ref 0–0)

## 2012-09-11 LAB — COMPREHENSIVE METABOLIC PANEL (CC13)
ALT: 12 U/L (ref 0–55)
AST: 16 U/L (ref 5–34)
Albumin: 3.3 g/dL — ABNORMAL LOW (ref 3.5–5.0)
CO2: 25 mEq/L (ref 22–29)
Calcium: 9.1 mg/dL (ref 8.4–10.4)
Chloride: 107 mEq/L (ref 98–109)
Creatinine: 0.7 mg/dL (ref 0.6–1.1)
Potassium: 3.9 mEq/L (ref 3.5–5.1)

## 2012-09-11 MED ORDER — DEXAMETHASONE SODIUM PHOSPHATE 20 MG/5ML IJ SOLN
20.0000 mg | Freq: Once | INTRAMUSCULAR | Status: AC
  Administered 2012-09-11: 20 mg via INTRAVENOUS

## 2012-09-11 MED ORDER — SODIUM CHLORIDE 0.9 % IJ SOLN
10.0000 mL | INTRAMUSCULAR | Status: DC | PRN
  Administered 2012-09-11: 10 mL
  Filled 2012-09-11: qty 10

## 2012-09-11 MED ORDER — DIPHENHYDRAMINE HCL 50 MG/ML IJ SOLN
50.0000 mg | Freq: Once | INTRAMUSCULAR | Status: AC
  Administered 2012-09-11: 50 mg via INTRAVENOUS

## 2012-09-11 MED ORDER — FAMOTIDINE IN NACL 20-0.9 MG/50ML-% IV SOLN
20.0000 mg | Freq: Once | INTRAVENOUS | Status: AC
  Administered 2012-09-11: 20 mg via INTRAVENOUS

## 2012-09-11 MED ORDER — ONDANSETRON 16 MG/50ML IVPB (CHCC)
16.0000 mg | Freq: Once | INTRAVENOUS | Status: AC
  Administered 2012-09-11: 16 mg via INTRAVENOUS

## 2012-09-11 MED ORDER — SODIUM CHLORIDE 0.9 % IV SOLN
Freq: Once | INTRAVENOUS | Status: AC
  Administered 2012-09-11: 10:00:00 via INTRAVENOUS

## 2012-09-11 MED ORDER — PACLITAXEL CHEMO INJECTION 300 MG/50ML
80.0000 mg/m2 | Freq: Once | INTRAVENOUS | Status: AC
  Administered 2012-09-11: 126 mg via INTRAVENOUS
  Filled 2012-09-11: qty 21

## 2012-09-11 MED ORDER — MAGIC MOUTHWASH W/LIDOCAINE: 5.0000 mL | Freq: Four times a day (QID) | ORAL | Status: DC

## 2012-09-11 MED ORDER — SODIUM CHLORIDE 0.9 % IV SOLN
244.8000 mg | Freq: Once | INTRAVENOUS | Status: AC
  Administered 2012-09-11: 240 mg via INTRAVENOUS
  Filled 2012-09-11: qty 24

## 2012-09-11 MED ORDER — HEPARIN SOD (PORK) LOCK FLUSH 100 UNIT/ML IV SOLN
500.0000 [IU] | Freq: Once | INTRAVENOUS | Status: AC | PRN
  Administered 2012-09-11: 500 [IU]
  Filled 2012-09-11: qty 5

## 2012-09-11 NOTE — Patient Instructions (Addendum)
Doing well.  Proceed with chemotherapy.  Take Dexamethasone 8mg  twice a day for three days starting tomorrow 09/12/12.  Take Ondansetron 8mg  twice a day for three days starting tomorrow 09/12/12.  Take Lorazepam or Prochlorperazine every 6 hours as needed for nausea.    Please call us if you have any questions or concerns.

## 2012-09-11 NOTE — Telephone Encounter (Signed)
[facility] [address] stating she needs the MMW and the [facility].  Order was called in by [place] earlier today.  Pharmacy found t he order but state they are out of lidocaine and did not have our number to call and let someone know.  [facility] to pick up the MMW.  Instructions given with this call how to use the MMW.  Will update the med list.

## 2012-09-11 NOTE — Telephone Encounter (Signed)
[place]ake 

## 2012-09-11 NOTE — Patient Instructions (Addendum)
[facility] Discharge Instructions for Patients Receiving Chemotherapy  Today you received the following chemotherapy agents:   To help prevent nausea and vomiting after your treatment, we encourage you to take your nausea medication.  Take it as often as prescribed.     If you develop nausea and vomiting that is not controlled by your nausea medication, call the clinic. If it is after clinic hours your family physician or the after hours number for the clinic or go to the Emergency Department.   BELOW ARE SYMPTOMS THAT SHOULD BE REPORTED IMMEDIATELY:  *FEVER GREATER THAN 100.5 F  *CHILLS WITH OR WITHOUT FEVER  NAUSEA AND VOMITING THAT IS NOT CONTROLLED WITH YOUR NAUSEA MEDICATION  *UNUSUAL SHORTNESS OF BREATH  *UNUSUAL BRUISING OR BLEEDING  TENDERNESS IN MOUTH AND THROAT WITH OR WITHOUT PRESENCE OF ULCERS  *URINARY PROBLEMS  *BOWEL PROBLEMS  UNUSUAL RASH Items with * indicate a potential emergency and should be followed up as soon as possible.  Feel free to call the clinic you have any questions or concerns. The clinic phone number is [phone].   I have been informed and understand all the instructions given to me. I know to contact the clinic, my physician, or go to the Emergency Department if any problems should occur. I do not have any questions at this time, but understand that I may call the clinic during office hours   should I have any questions or need assistance in obtaining follow up care.    __________________________________________  _____________  __________ Signature of Patient or Authorized Representative            Date                   Time    __________________________________________ Nurse's Signature   Carboplatin injection What is this medicine? CARBOPLATIN (KAR boe pla tin) is a chemotherapy drug. It targets fast dividing cells, like cancer cells, and causes these cells to die. This medicine is used to treat ovarian cancer  and many other cancers. This medicine may be used for other purposes; ask your health care provider or pharmacist if you have questions. What should I tell my health care provider before I take this medicine? They need to know if you have any of these conditions: -blood disorders -hearing problems -kidney disease -recent or ongoing radiation therapy -an unusual or allergic reaction to carboplatin, cisplatin, other chemotherapy, other medicines, foods, dyes, or preservatives -pregnant or trying to get pregnant -breast-feeding How should I use this medicine? This drug is usually given as an infusion into a vein. It is administered in a hospital or clinic by a specially trained health care professional. Talk to your pediatrician regarding the use of this medicine in children. Special care may be needed. Overdosage: If you think you have taken too much of this medicine contact a poison control center or emergency room at once. NOTE: This medicine is only for you. Do not share this medicine with others. What if I miss a dose? It is important not to miss a dose. Call your doctor or health care professional if you are unable to keep an appointment. What may interact with this medicine? -medicines for seizures -medicines to increase blood counts like filgrastim, pegfilgrastim, sargramostim -some antibiotics like amikacin, gentamicin, neomycin, streptomycin, tobramycin -vaccines Talk to your doctor or health care professional before taking any of these medicines: -acetaminophen -aspirin -ibuprofen -ketoprofen -naproxen This list may not describe all possible interactions. Give your health  care provider a list of all the medicines, herbs, non-prescription drugs, or dietary supplements you use. Also tell them if you smoke, drink alcohol, or use illegal drugs. Some items may interact with your medicine. What should I watch for while using this medicine? Your condition will be monitored carefully  while you are receiving this medicine. You will need important blood work done while you are taking this medicine. This drug may make you feel generally unwell. This is not uncommon, as chemotherapy can affect healthy cells as well as cancer cells. Report any side effects. Continue your course of treatment even though you feel ill unless your doctor tells you to stop. In some cases, you may be given additional medicines to help with side effects. Follow all directions for their use. Call your doctor or health care professional for advice if you get a fever, chills or sore throat, or other symptoms of a cold or flu. Do not treat yourself. This drug decreases your body's ability to fight infections. Try to avoid being around people who are sick. This medicine may increase your risk to bruise or bleed. Call your doctor or health care professional if you notice any unusual bleeding. Be careful brushing and flossing your teeth or using a toothpick because you may get an infection or bleed more easily. If you have any dental work done, tell your dentist you are receiving this medicine. Avoid taking products that contain aspirin, acetaminophen, ibuprofen, naproxen, or ketoprofen unless instructed by your doctor. These medicines may hide a fever. Do not become pregnant while taking this medicine. Women should inform their doctor if they wish to become pregnant or think they might be pregnant. There is a potential for serious side effects to an unborn child. Talk to your health care professional or pharmacist for more information. Do not breast-feed an infant while taking this medicine. What side effects may I notice from receiving this medicine? Side effects that you should report to your doctor or health care professional as soon as possible: -allergic reactions like skin rash, itching or hives, swelling of the face, lips, or tongue -signs of infection - fever or chills, cough, sore throat, pain or difficulty  passing urine -signs of decreased platelets or bleeding - bruising, pinpoint red spots on the skin, black, tarry stools, nosebleeds -signs of decreased red blood cells - unusually weak or tired, fainting spells, lightheadedness -breathing problems -changes in hearing -changes in vision -chest pain -high blood pressure -low blood counts - This drug may decrease the number of white blood cells, red blood cells and platelets. You may be at increased risk for infections and bleeding. -nausea and vomiting -pain, swelling, redness or irritation at the injection site -pain, tingling, numbness in the hands or feet -problems with balance, talking, walking -trouble passing urine or change in the amount of urine Side effects that usually do not require medical attention (report to your doctor or health care professional if they continue or are bothersome): -hair loss -loss of appetite -metallic taste in the mouth or changes in taste This list may not describe all possible side effects. Call your doctor for medical advice about side effects. You may report side effects to FDA at 1-800-FDA-1088. Where should I keep my medicine? This drug is given in a hospital or clinic and will not be stored at home. NOTE: This sheet is a summary. It may not cover all possible information. If you have questions about this medicine, talk to your doctor, pharmacist, or health  care provider.  2012, [employer]. (04/18/2007 2:38:05 PM)  Paclitaxel injection (Taxol) What is this medicine? PACLITAXEL (PAK li TAX el) is a chemotherapy drug. It targets fast dividing cells, like cancer cells, and causes these cells to die. This medicine is used to treat ovarian cancer, breast cancer, and other cancers. This medicine may be used for other purposes; ask your health care provider or pharmacist if you have questions. What should I tell my health care provider before I take this medicine? They need to know if you have  any of these conditions: -blood disorders -irregular heartbeat -infection (especially a virus infection such as chickenpox, cold sores, or herpes) -liver disease -previous or ongoing radiation therapy -an unusual or allergic reaction to paclitaxel, alcohol, polyoxyethylated castor oil, other chemotherapy agents, other medicines, foods, dyes, or preservatives -pregnant or trying to get pregnant -breast-feeding How should I use this medicine? This drug is given as an infusion into a vein. It is administered in a hospital or clinic by a specially trained health care professional. Talk to your pediatrician regarding the use of this medicine in children. Special care may be needed. Overdosage: If you think you have taken too much of this medicine contact a poison control center or emergency room at once. NOTE: This medicine is only for you. Do not share this medicine with others. What if I miss a dose? It is important not to miss your dose. Call your doctor or health care professional if you are unable to keep an appointment. What may interact with this medicine? Do not take this medicine with any of the following medications: -disulfiram -metronidazole This medicine may also interact with the following medications: -cyclosporine -dexamethasone -diazepam -ketoconazole -medicines to increase blood counts like filgrastim, pegfilgrastim, sargramostim -other chemotherapy drugs like cisplatin, doxorubicin, epirubicin, etoposide, teniposide, vincristine -quinidine -testosterone -vaccines -verapamil Talk to your doctor or health care professional before taking any of these medicines: -acetaminophen -aspirin -ibuprofen -ketoprofen -naproxen This list may not describe all possible interactions. Give your health care provider a list of all the medicines, herbs, non-prescription drugs, or dietary supplements you use. Also tell them if you smoke, drink alcohol, or use illegal drugs. Some items  may interact with your medicine. What should I watch for while using this medicine? Your condition will be monitored carefully while you are receiving this medicine. You will need important blood work done while you are taking this medicine. This drug may make you feel generally unwell. This is not uncommon, as chemotherapy can affect healthy cells as well as cancer cells. Report any side effects. Continue your course of treatment even though you feel ill unless your doctor tells you to stop. In some cases, you may be given additional medicines to help with side effects. Follow all directions for their use. Call your doctor or health care professional for advice if you get a fever, chills or sore throat, or other symptoms of a cold or flu. Do not treat yourself. This drug decreases your body's ability to fight infections. Try to avoid being around people who are sick. This medicine may increase your risk to bruise or bleed. Call your doctor or health care professional if you notice any unusual bleeding. Be careful brushing and flossing your teeth or using a toothpick because you may get an infection or bleed more easily. If you have any dental work done, tell your dentist you are receiving this medicine. Avoid taking products that contain aspirin, acetaminophen, ibuprofen, naproxen, or ketoprofen unless instructed by  your doctor. These medicines may hide a fever. Do not become pregnant while taking this medicine. Women should inform their doctor if they wish to become pregnant or think they might be pregnant. There is a potential for serious side effects to an unborn child. Talk to your health care professional or pharmacist for more information. Do not breast-feed an infant while taking this medicine. Men are advised not to father a child while receiving this medicine. What side effects may I notice from receiving this medicine? Side effects that you should report to your doctor or health care  professional as soon as possible: -allergic reactions like skin rash, itching or hives, swelling of the face, lips, or tongue -low blood counts - This drug may decrease the number of white blood cells, red blood cells and platelets. You may be at increased risk for infections and bleeding. -signs of infection - fever or chills, cough, sore throat, pain or difficulty passing urine -signs of decreased platelets or bleeding - bruising, pinpoint red spots on the skin, black, tarry stools, nosebleeds -signs of decreased red blood cells - unusually weak or tired, fainting spells, lightheadedness -breathing problems -chest pain -high or low blood pressure -mouth sores -nausea and vomiting -pain, swelling, redness or irritation at the injection site -pain, tingling, numbness in the hands or feet -slow or irregular heartbeat -swelling of the ankle, feet, hands Side effects that usually do not require medical attention (report to your doctor or health care professional if they continue or are bothersome): -bone pain -complete hair loss including hair on your head, underarms, pubic hair, eyebrows, and eyelashes -changes in the color of fingernails -diarrhea -loosening of the fingernails -loss of appetite -muscle or joint pain -red flush to skin -sweating This list may not describe all possible side effects. Call your doctor for medical advice about side effects. You may report side effects to FDA at 1-800-FDA-1088. Where should I keep my medicine? This drug is given in a hospital or clinic and will not be stored at home. NOTE: This sheet is a summary. It may not cover all possible information. If you have questions about this medicine, talk to your doctor, pharmacist, or health care provider.  2012, [employer]. (12/25/2007 11:54:26 AM)

## 2012-09-11 NOTE — Telephone Encounter (Signed)
appts made and printed. sw MW to add the tx for 09/26/12 but the books are closed. i sent LA an email making her aware of this and waiting on her to respond....td

## 2012-09-11 NOTE — Progress Notes (Signed)
OFFICE PROGRESS NOTE  CC  [name], MD [address] [place] [address] Dr. Everardo Beals Dr. 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 Patient had a palpable mass at the 3:00 position. She was seen at the breast Center on 06/20/2012 she had a mammogram and ultrasound showing a 1.7 cm mass at the 3:00 position. 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 Tumor was grade 3.  #2 MRIs performed on 06/26/2012 showed no biopsy mass at 3:00 but also 2 other adjacent masses at 1 at 2:00 measuring 0.8 x 1.6 x 2.2 cm andalso 6 mm ill-defined mass posterior to the biopsy-proven malignancy.  #3 patient has gone on to have a Port-A-Cath placed to begin neoadjuvant chemotherapy initially consisting of Adriamycin Cytoxan to be given dose dense for 4 cycles followed by weekly Taxol and carboplatinum for a total of 12 weeks.carefully agent made to have an excellent response to neoadjuvant chemotherapy and could possibly have breast preservation of course she may even need chest mastectomy with sentinel lymph node biopsies. Patient and I had met at the [facility] clinic we discussed the rationale for upfront chemotherapy she is now here to receive that.    CURRENT THERAPY: Taxol Carbo week 1  INTERVAL HISTORY: Marketta R Smiddy 52 y.o. female returns for evaluation prior to Taxol/Carbo week 1.  She is having problems with mucositis and ulcerations in her mouth.  She has been eating icees and cold things.  She has tried biotene and it hasn't helped. She had a blood transfusion last week and feels much better. She denies fevers, chills, nausea, vomiting, constipation, diarrhea, numbness  or any further concerns.    MEDICAL HISTORY: Past Medical History  Diagnosis Date  . HSV infection   . NSVD (normal spontaneous vaginal delivery)   . Hypertension     takes HCTZ  . Urinary frequency   . Anemia     takes Ferrous Sulfate occasionally  . Breast cancer 2014    triple negative    ALLERGIES:  has No Known Allergies.  MEDICATIONS:  Current Outpatient Prescriptions  Medication Sig Dispense Refill  . ciprofloxacin (CIPRO) 500 MG tablet Take 1 tablet (500 mg total) by mouth 2 (two) times daily.  14 tablet  5  . dexamethasone (DECADRON) 4 MG tablet Take 4 mg by mouth 2 (two) times daily with a meal. Take 2 tabs every day on the day after chemo an then take 2 tabs twice a day with food for 2 days.      . ferrous sulfate 325 (65 FE) MG tablet Take 325 mg by mouth daily with breakfast.      . hydrochlorothiazide (HYDRODIURIL) 25 MG tablet Take 1 tablet (25 mg total) by mouth daily.  30 tablet  11  . lidocaine-prilocaine (EMLA) cream Apply 1 application topically as needed (apply to PAC site 1-2 hours prior to treatment.).      Marland Kitchen LORazepam (ATIVAN) 0.5 MG tablet Take 0.5 mg by mouth every 6 (six) hours as needed for anxiety (as needed for nausea and vomitting).      . ondansetron (ZOFRAN) 8 MG tablet Take 8 mg by mouth every 8 (eight) hours as needed for nausea (Take 1 pill by  mouth twice a day as needed  for nausea and vomitting starting 3rd day after chemo).      Marland Kitchen oxyCODONE-acetaminophen (ROXICET) 5-325 MG per tablet Take 1-2 tablets by mouth every 4 (four) hours as needed for pain.  30 tablet  0  . prochlorperazine (COMPAZINE) 10 MG tablet Take 10 mg by mouth every 6 (six) hours as needed (for nausea and vomitting).      . prochlorperazine (COMPAZINE) 25 MG suppository Place 25 mg rectally every 12 (twelve) hours as needed for nausea (as needed for nausea and vomitting).      Marland Kitchen UNABLE TO FIND Apply 1 application topically once. 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  1 application  0  . valACYclovir (VALTREX) 500 MG tablet Take 500 mg by mouth daily as needed. For herpes       No current facility-administered medications for this visit.    SURGICAL HISTORY:  Past Surgical History  Procedure Laterality Date  . Tubal ligation  2003    LTL  . Portacath placement N/A 07/06/2012    Procedure: INSERTION PORT-A-CATH;  Surgeon: Almond Lint, MD;  Location: [facility] OR;  Service: General;  Laterality: N/A;    REVIEW OF SYSTEMS:  General: fatigue (-), night sweats (-), fever (-), pain (-) Lymph: palpable nodes (-) HEENT: vision changes (-), mucositis (-), gum bleeding (-), epistaxis (-) Cardiovascular: chest pain (-), palpitations (-) Pulmonary: shortness of breath (-), dyspnea on exertion (-), cough (-), hemoptysis (-) GI:  Early satiety (-), melena (-), dysphagia (-), nausea/vomiting (-), diarrhea (-) GU: dysuria (-), hematuria (-), incontinence (-) Musculoskeletal: joint swelling (-), joint pain (-), back pain (-) Neuro: weakness (-), numbness (-), headache (-), confusion (-) Skin: Rash (-), lesions (-), dryness (-) Psych: depression (-), suicidal/homicidal ideation (-), feeling of hopelessness (-)   PHYSICAL EXAMINATION: Blood pressure 129/79, pulse 81, temperature 97.4 F (36.3 C), temperature source Oral, resp. rate 18, height 5\' 3"  (1.6 m), weight 126 lb 3.2 oz (57.244 kg). Body mass index is 22.36 kg/(m^2). General: Patient is a well appearing female in no acute distress HEENT: PERRLA, sclerae anicteric no conjunctival pallor, MMM, small ulceration under tongue on right and left lateral sides.  Neck: supple, no palpable adenopathy Lungs: clear to auscultation bilaterally, no wheezes, rhonchi, or rales Cardiovascular: regular rate rhythm, S1, S2, no murmurs, rubs or gallops Abdomen: Soft, non-tender, non-distended, normoactive bowel sounds, no HSM Extremities: warm and well perfused, no clubbing, cyanosis, or  edema Skin: No rashes or lesions Neuro: Non-focal Breast examination: Left breast no masses or nipple discharge Right breast reveals 3 discrete masses at the 3:00 position 2:00 position.  It is nontender there is no nipple discharge. Conglomeration of these masses to my exam measure about 2-3cm. ECOG PERFORMANCE STATUS: 0 - Asymptomatic  LABORATORY DATA: Lab Results  Component Value Date   WBC 11.0* 09/11/2012   HGB 11.0* 09/11/2012   HCT 32.8* 09/11/2012   MCV 92.7 09/11/2012   PLT 243 09/11/2012      Chemistry      Component Value Date/Time   NA 139 09/04/2012 1452   NA 137 06/06/2012 0853   K 3.8 09/04/2012 1452   K 3.8 06/06/2012 0853   CL 104 07/17/2012 1357   CL 102 06/06/2012 0853   CO2 27 09/04/2012 1452   CO2 26 06/06/2012 0853   BUN 11.7 09/04/2012 1452   BUN 12 06/06/2012 0853   CREATININE 0.6 09/04/2012 1452  CREATININE 0.70 06/06/2012 0853   CREATININE 0.85 04/24/2009 1114      Component Value Date/Time   CALCIUM 8.9 09/04/2012 1452   CALCIUM 9.3 06/06/2012 0853   ALKPHOS 77 09/04/2012 1452   ALKPHOS 35* 06/06/2012 0853   AST 11 09/04/2012 1452   AST 14 06/06/2012 0853   ALT 8 09/04/2012 1452   ALT 9 06/06/2012 0853   BILITOT 0.20 09/04/2012 1452   BILITOT 0.8 06/06/2012 0853     ADDITIONAL INFORMATION: PROGNOSTIC INDICATORS - ACIS Results: IMMUNOHISTOCHEMICAL AND MORPHOMETRIC ANALYSIS BY THE AUTOMATED CELLULAR IMAGING SYSTEM (ACIS) Estrogen Receptor: 0%, NEGATIVE Progesterone Receptor: 0%, NEGATIVE Proliferation Marker Ki67: 95% COMMENT: The negative hormone receptor study(ies) in this case have an internal positive control. REFERENCE RANGE ESTROGEN RECEPTOR NEGATIVE <1% POSITIVE =>1% PROGESTERONE RECEPTOR NEGATIVE <1% POSITIVE =>1% All controls stained appropriately Pecola Leisure MD Pathologist, Electronic Signature ( Signed 06/28/2012) CHROMOGENIC IN-SITU HYBRIDIZATION Results: HER-2/NEU BY CISH - NO AMPLIFICATION OF HER-2 DETECTED. RESULT RATIO OF HER2: CEP  17 SIGNALS 1.26 1 of 3 Duplicate copy FINAL for RENA, HUNKE [phone]) ADDITIONAL INFORMATION:(continued) AVERAGE HER2 COPY NUMBER PER CELL 2.45 REFERENCE RANGE NEGATIVE HER2/Chr17 Ratio <2.0 and Average HER2 copy number <4.0 EQUIVOCAL HER2/Chr17 Ratio <2.0 and Average HER2 copy number 4.0 and <6.0 POSITIVE HER2/Chr17 Ratio >=2.0 and/or Average HER2 copy number >=6.0 Jimmy Picket MD Pathologist, Electronic Signature ( Signed 06/26/2012) FINAL DIAGNOSIS Diagnosis Breast, right, needle core biopsy, 3:00 position - INVASIVE DUCTAL CARCINOMA SEE COMMENT. - DUCTAL CARCINOMA IN SITU Microscopic Comment Although the grade of tumor is best assessed at resection, with these biopsies, both the invasive and in situ carcinoma are grade III. Breast prognostic studies are pending and will be reported in an addendum. The case is reviewed with Dr. Raynald Blend who concurs. (CRR:gt, 06/21/12) [place] RUND DO Pathologist, Electronic FINAL DIAGNOSIS Diagnosis Breast, right, needle core biopsy, mass - BENIGN BREAST TISSUE WITH FOCAL PSEUDOANGIOMATOUS STROMAL HYPERPLASIA (PASH). NO EVIDENCE OF MALIGNANCY. Microscopic Comment Called to the [facility] on 07/13/12. (JDP:caf 07/13/12)  RADIOGRAPHIC STUDIES:  Dg Chest 2 View  07/04/2012   *RADIOLOGY REPORT*  Clinical Data: Insertion of Port-A-Cath.  Breast cancer. Hypertension.  CHEST - 2 VIEW  Comparison: CT of the chest on 07/03/2012  Findings: Cardiomediastinal silhouette is within normal limits. The lungs are free of focal consolidations and pleural effusions. Bony structures have a normal appearance.  Contrast is identified within the colon following CT of the abdomen.  IMPRESSION: Negative exam.   Original Report Authenticated By: Norva Pavlov, M.D.   Ct Chest W Contrast  07/03/2012   *RADIOLOGY REPORT*  Clinical Data:  Breast cancer.  No therapy yet.  Lower inner quadrant of right breast.  CT CHEST, ABDOMEN AND PELVIS WITH CONTRAST   Technique: Contiguous axial images of the chest abdomen and pelvis were obtained after IV contrast administration.  Contrast: 80 ml Omnipaque-300  Comparison: Breast MR of 06/26/2012.  No prior CTs.  CT CHEST  Findings: Lung windows demonstrate minimal left lower lobe subpleural nodularity at 3 mm on image 40/series 4.  Right lung clear.  Soft tissue windows demonstrate small bilateral axillary nodes, without adenopathy.  No subpectoral adenopathy. No supraclavicular adenopathy.  Normal heart size, without pericardial effusion.  No central pulmonary embolism, on this non-dedicated study.  No mediastinal or hilar adenopathy.  No internal mammary adenopathy. Minimal residual thymic tissue in the anterior mediastinum (image 17/series 2).  IMPRESSION:  1. No acute process or evidence of metastatic disease in the chest.  2.  Probable subpleural lymph node in the left lower lobe. Recommend attention on follow-up.  CT ABDOMEN AND PELVIS  Findings:  Mild early phase of hepatic enhancement which decreases sensitivity for focal liver lesions.  None identified.  Normal spleen, stomach, pancreas, gallbladder, biliary tract, adrenal glands, kidneys. No retroperitoneal or retrocrural adenopathy.  Normal colon, appendix, and terminal ileum.  Normal small bowel without abdominal ascites.  No pelvic adenopathy.    Normal urinary bladder and uterus.  No adnexal mass.  Trace free pelvic fluid is likely physiologic.  Transitional right-sided lumbosacral anatomy.  Degenerative disc disease at the lumbosacral junction.  IMPRESSION:  No acute process or evidence of metastatic disease in the abdomen or pelvis.   Original Report Authenticated By: Jeronimo Greaves, M.D.   US Breast Right  06/29/2012   *RADIOLOGY REPORT*  Clinical Data:  52 year old female with newly diagnosed right breast cancer.  Suspicious abnormal enhancement anterior and superior to the biopsy-proven neoplasm - for second look ultrasound evaluation.  RIGHT BREAST ULTRASOUND   Comparison:  Prior mammograms, ultrasounds and MRI.  Findings: Ultrasound is performed, showing the biopsy-proven neoplasm at the 3 o'clock position of the right breast 4 cm from the nipple. No other discrete sonographic abnormalities are identified, specifically medial, superior, and anterior to the biopsy-proven neoplasm.  IMPRESSION: Patchy abnormal enhancement medial superior to the biopsy-proven neoplasm identified on recent MRI is not visualized sonographically as a discrete abnormality.  MR guided biopsy is recommended if breast conservation surgery is considered.  BI-RADS CATEGORY 6:  Known biopsy-proven malignancy - appropriate action should be taken.  RECOMMENDATION: MR guided right breast biopsy, which will be scheduled by our office.  I have discussed the findings and recommendations with the patient. Results were also provided in writing at the conclusion of the visit.  If applicable, a reminder letter will be sent to the patient regarding the next appointment.   Original Report Authenticated By: Harmon Pier, M.D.   US Breast Right  06/20/2012   *RADIOLOGY REPORT*  Clinical Data:  52 year old female for annual bilateral mammograms and palpable mass in the upper and inner right breast discovered on clinical examination.  DIGITAL DIAGNOSTIC BILATERAL MAMMOGRAM WITH CAD AND RIGHT BREAST ULTRASOUND:  Comparison:  03/21/2009 and prior mammograms dating back to 01/21/2005  Findings:  ACR Breast Density Category 4: The breast tissue is extremely dense.  Routine and spot compression views of the right breast demonstrate no evidence of suspicious mass or distortion. Scattered punctate calcifications within the breasts bilaterally are again identified.  Mammographic images were processed with CAD.  On physical exam, thickening is identified at the 12 o'clock position of the right breast 4 cm from the nipple. A firm palpable mass identified at the 3 o'clock position of the right breast 4 cm from the nipple.   Ultrasound is performed, showing no evidence of solid or cystic mass, distortion or abnormal areas of shadowing at the 12 o'clock position of the right breast. A 7 x 6 x 17 mm slightly irregular oval hypoechoic mass with calcifications is horizontally oriented at the 3 o'clock position of the right breast 4 cm from the nipple.  IMPRESSION: Indeterminate 7 x 6 x 17 mm hypoechoic mass at the 3 o'clock position of the right breast.  Tissue sampling is recommended.  No mammographic evidence of breast malignancy bilaterally.  No mammographic, palpable or sonographic abnormality at the 12 o'clock position of the right breast, in the area of palpable concern.  BI-RADS CATEGORY 4:  Suspicious  abnormality - biopsy should be considered.  RECOMMENDATION:  Ultrasound guided right breast biopsy, which will be performed today but dictated in a separate report.  I have discussed the findings and recommendations with the patient. Results were also provided in writing at the conclusion of the visit.  If applicable, a reminder letter will be sent to the patient regarding the next appointment.   Original Report Authenticated By: Harmon Pier, M.D.   Ct Abdomen Pelvis W Contrast  07/03/2012   *RADIOLOGY REPORT*  Clinical Data:  Breast cancer.  No therapy yet.  Lower inner quadrant of right breast.  CT CHEST, ABDOMEN AND PELVIS WITH CONTRAST  Technique: Contiguous axial images of the chest abdomen and pelvis were obtained after IV contrast administration.  Contrast: 80 ml Omnipaque-300  Comparison: Breast MR of 06/26/2012.  No prior CTs.  CT CHEST  Findings: Lung windows demonstrate minimal left lower lobe subpleural nodularity at 3 mm on image 40/series 4.  Right lung clear.  Soft tissue windows demonstrate small bilateral axillary nodes, without adenopathy.  No subpectoral adenopathy. No supraclavicular adenopathy.  Normal heart size, without pericardial effusion.  No central pulmonary embolism, on this non-dedicated study.  No  mediastinal or hilar adenopathy.  No internal mammary adenopathy. Minimal residual thymic tissue in the anterior mediastinum (image 17/series 2).  IMPRESSION:  1. No acute process or evidence of metastatic disease in the chest. 2.  Probable subpleural lymph node in the left lower lobe. Recommend attention on follow-up.  CT ABDOMEN AND PELVIS  Findings:  Mild early phase of hepatic enhancement which decreases sensitivity for focal liver lesions.  None identified.  Normal spleen, stomach, pancreas, gallbladder, biliary tract, adrenal glands, kidneys. No retroperitoneal or retrocrural adenopathy.  Normal colon, appendix, and terminal ileum.  Normal small bowel without abdominal ascites.  No pelvic adenopathy.    Normal urinary bladder and uterus.  No adnexal mass.  Trace free pelvic fluid is likely physiologic.  Transitional right-sided lumbosacral anatomy.  Degenerative disc disease at the lumbosacral junction.  IMPRESSION:  No acute process or evidence of metastatic disease in the abdomen or pelvis.   Original Report Authenticated By: Jeronimo Greaves, M.D.   Mr Breast Right W Wo Contrast  07/12/2012   *RADIOLOGY REPORT*  Clinical Data:  Known invasive mammary carcinoma in the right breast at 3 o'clock posteriorly.  Additional site of abnormal enhancement in the right upper inner quadrant closer to the nipple for possible biopsy.  ATTEMPTED MRI GUIDED VACUUM ASSISTED BIOPSY OF THE RIGHT BREAST WITHOUT AND WITH CONTRAST  Comparison: Previous exams.  Technique: Multiplanar, multisequence MR images of the right breast were obtained prior to and following the intravenous administration of 9 ml of Mulithance.  I met with the patient, and we discussed the procedure of MRI guided biopsy, including risks, benefits, and alternatives. Specifically, we discussed the risks of infection, bleeding, tissue injury, clip migration, and inadequate sampling.  Informed, written consent was given.  Using sterile technique, 2% Lidocaine,  MRI guidance, and a 9 gauge vacuum assisted device, biopsy was attempted using a mediolateral approach.  However, the procedure was unsuccessful due to the extremely thin breast compression.  IMPRESSION:  Unsuccessful MRI guided vacuum-assisted biopsy of the right breast.  Patient was sent to the [facility] for further evaluation with ultrasound.   Original Report Authenticated By: Cain Saupe, M.D.   Mr Breast Bilateral W Wo Contrast  06/26/2012   *RADIOLOGY REPORT*  Clinical Data: History of new diagnosis of breast cancer from recent  ultrasound guided core needle biopsy right breast 06/20/2012 demonstrating invasive ductal carcinoma with DCIS.  GFR 89, creatinine 0.7 on 06/06/2012.  BILATERAL BREAST MRI WITH AND WITHOUT CONTRAST  Technique: Multiplanar, multisequence MR images of both breasts were obtained prior to and following the intravenous administration of 10ml of multihance.  Three dimensional images were evaluated at the independent DynaCad workstation.  Comparison:  Recent mammograms and ultrasound.  Findings: Examination demonstrates mild background parenchymal enhancement.  Right breast:  There is evidence of an ovoid gently lobulated and somewhat ill-defined heterogeneously enhancing mass over the deep third of the inner midportion of the right breast representing patient's biopsy-proven malignancy.  This mass contains central clip artifact and measures approximately 0.9 x 1.9 x 1.0 cm. The anterior border of this mass is located 5.5 cm from the nipple. There is a 6 mm slightly ill-defined enhancing ovoid mass 6 mm posterior lateral to the biopsy-proven malignancy lying just superficial to the pectoralis muscle.  There is also clumped non mass enhancement anterior and slightly superior to the biopsy- proven malignancy in the middle third at approximately the 2 o'clock position.  This measures approximately 0.8 x 1.6 x 1.2 cm and is located 3 cm from the nipple.  This is suspicious for  multifocal disease.  No suspicious axillary or internal mammary lymph nodes.  Left breast:  No suspicious mass, enhancement or adenopathy.  IMPRESSION: Known biopsy-proven malignancy over the deep third of the inner mid right breast.  6 mm ill-defined enhancing ovoid mass 6 mm posterior lateral to the biopsy-proven malignancy and lying just superficial to the pectoralis muscle.  Clumped non mass enhancement anterior and superior to patient's known malignancy at approximately the 2 o'clock position measuring 0.8 x 1.6 x 2.2 cm.  Findings likely represent multi focal disease.  RECOMMENDATION: Recommend second look ultrasound and core biopsy if feasible of the clumped non mass enhancement at approximately the 2 o'clock position right breast anterior/superior to the known malignancy. If not seen by ultrasound, recommend proceeding to MR guided biopsy of this suspicious abnormality.  THREE-DIMENSIONAL MR IMAGE RENDERING ON INDEPENDENT WORKSTATION:  Three-dimensional MR images were rendered by post-processing of the original MR data on an independent workstation.  The three- dimensional MR images were interpreted, and findings were reported in the accompanying complete MRI report for this study.  BI-RADS CATEGORY 0:  Incomplete.  Need additional imaging evaluation and/or prior mammograms for comparison.   Original Report Authenticated By: Elberta Fortis, M.D.   Dg Chest Port 1 View  07/06/2012   *RADIOLOGY REPORT*  Clinical Data: Post left-sided port catheter insertion  PORTABLE CHEST - 1 VIEW  Comparison: 07/04/2012  Findings:  Grossly unchanged cardiac silhouette and mediastinal contours. Interval placement of a left anterior chest wall subclavian vein approach port-a-catheter with tip projecting over the superior cavoatrial junction. Grossly unchanged symmetric biapical pleural parenchymal thickening.  No pneumothorax.  No focal airspace opacities.  No pleural effusion.  No evidence of edema.  Unchanged bones.   IMPRESSION: Interval placement of a left anterior chest wall subclavian vein approach port-a-catheter with tip overlying the superior cavoatrial junction.  No pneumothorax.   Original Report Authenticated By: Tacey Ruiz, MD   Mm Digital Diagnostic Bilat  06/20/2012   *RADIOLOGY REPORT*  Clinical Data:  52 year old female for annual bilateral mammograms and palpable mass in the upper and inner right breast discovered on clinical examination.  DIGITAL DIAGNOSTIC BILATERAL MAMMOGRAM WITH CAD AND RIGHT BREAST ULTRASOUND:  Comparison:  03/21/2009 and prior mammograms dating  back to 01/21/2005  Findings:  ACR Breast Density Category 4: The breast tissue is extremely dense.  Routine and spot compression views of the right breast demonstrate no evidence of suspicious mass or distortion. Scattered punctate calcifications within the breasts bilaterally are again identified.  Mammographic images were processed with CAD.  On physical exam, thickening is identified at the 12 o'clock position of the right breast 4 cm from the nipple. A firm palpable mass identified at the 3 o'clock position of the right breast 4 cm from the nipple.  Ultrasound is performed, showing no evidence of solid or cystic mass, distortion or abnormal areas of shadowing at the 12 o'clock position of the right breast. A 7 x 6 x 17 mm slightly irregular oval hypoechoic mass with calcifications is horizontally oriented at the 3 o'clock position of the right breast 4 cm from the nipple.  IMPRESSION: Indeterminate 7 x 6 x 17 mm hypoechoic mass at the 3 o'clock position of the right breast.  Tissue sampling is recommended.  No mammographic evidence of breast malignancy bilaterally.  No mammographic, palpable or sonographic abnormality at the 12 o'clock position of the right breast, in the area of palpable concern.  BI-RADS CATEGORY 4:  Suspicious abnormality - biopsy should be considered.  RECOMMENDATION:  Ultrasound guided right breast biopsy, which will be  performed today but dictated in a separate report.  I have discussed the findings and recommendations with the patient. Results were also provided in writing at the conclusion of the visit.  If applicable, a reminder letter will be sent to the patient regarding the next appointment.   Original Report Authenticated By: Harmon Pier, M.D.   Mm Digital Diagnostic Unilat R  07/12/2012   *RADIOLOGY REPORT*  Clinical Data:  Ultrasound-guided core needle biopsy of a hypoechoic area at 2 o'clock 2 cm from the right nipple with clip placement.  Known invasive mammary carcinoma in the right breast. Unsuccessful right breast MRI guided biopsy earlier today.  DIGITAL DIAGNOSTIC RIGHT MAMMOGRAM  Comparison:  Previous exams.  Findings:  Films are performed following ultrasound guided biopsy of a small hypoechoic area at 2 o'clock 2 cm from the right nipple. The ribbon clip is positioned within the area of concern. The ribbon clip is approximately 2.6 cm anterior to the top hat clip from the original biopsy on 06/20/2012.  IMPRESSION: Appropriate clip placement following ultrasound-guided core needle biopsy of a hypoechoic area at 2 o'clock 2 cm in the right nipple.   Original Report Authenticated By: Cain Saupe, M.D.   Mm Digital Diagnostic Unilat R  06/20/2012   *RADIOLOGY REPORT*  Clinical Data:  52 year old female - evaluate clip placement following ultrasound guided right breast biopsy.  DIGITAL DIAGNOSTIC RIGHT MAMMOGRAM  Comparison:  Previous exams.  Findings:  Films are performed following ultrasound guided biopsy of the 7 x 6 x 17 mm hypoechoic mass at the 3 o'clock position of the right breast 4 cm from the nipple. The T shaped biopsy clip is in satisfactory position.  IMPRESSION: Satisfactory clip placement following ultrasound guided right breast biopsy.  Pathology will be followed.   Original Report Authenticated By: Harmon Pier, M.D.   Dg Fluoro Guide Cv Line-no Report  07/06/2012   CLINICAL DATA: port  placement   FLOURO GUIDE CV LINE  Fluoroscopy was utilized by the requesting physician.  No radiographic  interpretation.    [place] Rt Breast Bx W Loc Dev 1st Lesion Img Bx Spec US Guide  07/13/2012   **ADDENDUM** CREATED:  07/13/2012 14:11:45  I spoke with the patient by telephone on 07/13/2012 to discuss pathology results.  Pathology demonstrates  pseudoangiomatous stromal hyperplasia.  The findings were called to the office of Dr. Donell Beers. Although the imaging findings could be related to pseudoangiomatous stromal hyperplasia, excision of the anterior biopsy site is suggested at the time of patient's definitive surgery.  The patient is considering unilateral or bilateral mastectomy at this time.  The patient reports no problems at the biopsy site.  All questions were answered.  Recommendations:  Excision of the biopsy site. The patient is scheduled to begin chemotherapy in 4 days.  **END ADDENDUM** SIGNED BY: Blair Hailey. Manson Passey, M.D.  07/12/2012   *RADIOLOGY REPORT*  Clinical Data:  Unsuccessful MRI guided biopsy of the area of abnormal enhancement in the left anterior upper inner quadrant, anterior to the known invasive mammary carcinoma.  Second look ultrasound performed and a 5 x 4 x 4 mm hypoechoic area found at 2 o'clock 2 cm from the right nipple which will be biopsied.  ULTRASOUND GUIDED VACUUM ASSISTED CORE BIOPSY OF THE RIGHT BREAST  Sonography of the right upper inner quadrant was performed.  There is an ill-defined hypoechoic 5 x 4 x 4 mm area at 2 o'clock 2 cm from the right nipple which will be biopsied with ultrasound guidance.  The patient and I discussed the procedure of ultrasound-guided biopsy, including benefits and alternatives.  We discussed the high likelihood of a successful procedure. We discussed the risks of the procedure including infection, bleeding, tissue injury, clip migration, and inadequate sampling.  Written informed consent was given.  Using sterile technique, 2% lidocaine,  ultrasound guidance, and a 12 gauge vacuum assisted needle, biopsy was performed of the hypoechoic area at 2 o'clock 2 cm from the right nipple using a caudocranial approach.  At the conclusion of the procedure, a within tissue marker clip was deployed into the biopsy cavity. Follow-up 2-view mammogram was performed and dictated separately.  IMPRESSION: Ultrasound-guided biopsy of a 5 x 4 x 4 mm hypoechoic area at 2 o'clock 2 cm from the right nipple.  No apparent complications.   Original Report Authenticated By: Cain Saupe, M.D.   [place] Rt Breast Bx W Loc Dev 1st Lesion Img Bx Spec US Guide  06/21/2012   *RADIOLOGY REPORT*  Clinical Data:  Indeterminate 7 x 6 x 17 mm hypoechoic mass in the inner right breast - for tissue sampling.  ULTRASOUND GUIDED VACUUM ASSISTED CORE BIOPSY OF THE RIGHT BREAST  Comparison: Previous exams.  I met with the patient and we discussed the procedure of ultrasound- guided biopsy, including benefits and alternatives.  We discussed the high likelihood of a successful procedure. We discussed the risks of the procedure including infection, bleeding, tissue injury, clip migration, and inadequate sampling.  Informed written consent was given.  Using sterile technique, 2% lidocaine ultrasound guidance and a 12 gauge vacuum assisted needle biopsy was performed of the 7 x 6 x 17 mm hypoechoic mass at the 3 o'clock position of the right breast 4 cm from the nipple using a lateral approach.  At the conclusion of the procedure, a T shaped tissue marker clip was deployed into the biopsy cavity.  Follow-up 2-view mammogram was performed and dictated separately.  IMPRESSION: Ultrasound-guided biopsy of right breast mass.  No apparent complications.  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 Histology correlates with imaging findings.  The patient was contacted by phone on 06/21/2012 and these results given to her which  she understood. Her questions were answered. She was  encouraged to obtain breast cancer educational material from [facility] at her convenience. The patient had no complaints with her biopsy site.  Recommend surgery/oncology consultation.  An appointment at the [facility] has been scheduled for 06/28/2012. Recommend bilateral breast MRI, which has been scheduled for 06/26/2012. The patient was informed of these appointments.   Original Report Authenticated By: Harmon Pier, M.D.    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 Patient had other areas seen on MRI biopsy of the second mass was negative. Patient has had her Port-A-Cath placed. She is proceeding neoadjuvant chemotherapy to possibly conserve her breast. Chemotherapy will consist of Adriamycin Cytoxan given dose dense for 4 cycles starting today 07/17/2012. She will receive Neulasta support on day 2 for each cycle. When she completes this then she will begin weekly Taxol and carboplatinum for a total of 12 weeks.  #2 we discussed risks benefits and side effects of chemotherapy. She has had an echocardiogram as well as chemotherapy teaching class. Port-A-Cath was placed by Dr. Donell Beers. The port is functioning well.  #3 Taxol/Carbo cycle 1.      PLAN:  #1 Ms. Ruzich is doing well today.  Her labs have recovered.  She will proceed with Taxol/Carbo today.  I reviewed her nausea medicine in detail.    #2 She will return next week for her second cycle of Taxol/Carbo.    #3 I prescribed magic mouthwash for her to take four times per day swish and spit.    All questions were answered. The patient knows to call the clinic with any problems, questions or concerns. We can certainly see the patient much sooner if necessary.  I spent 25 minutes counseling the patient face to face. The total time spent in the appointment was 30 minutes.   Cherie Ouch Lyn Hollingshead, NP Medical  Oncology [facility] Phone: [phone] 09/11/2012, 9:39 AM

## 2012-09-12 ENCOUNTER — Encounter: Payer: Self-pay | Admitting: Dietician

## 2012-09-12 NOTE — Progress Notes (Signed)
Breast Cancer Nutrition Class Attendance Note  Date: 09/12/2012  Time: 1800  Pt attended [facility]'s Breast Cancer Nutrition Class, "Food For Your Fight". Pt was educated on basic cancer nutrition principles, including plant based diet and principles from [employer]  [employer]) about latest nutrition findings and recommendations. Questions answered. Handouts and recipes provided.   Loucile Posner A. Mayford Knife, RD, LDN Pager: [phone]

## 2012-09-17 ENCOUNTER — Telehealth: Payer: Self-pay | Admitting: Oncology

## 2012-09-17 NOTE — Telephone Encounter (Signed)
Added appts for 9/2 and 9/3. lmonvm for pt re appts and confirmed 8/25 appt. Pt to get new schedule when she comes in.

## 2012-09-18 ENCOUNTER — Ambulatory Visit (HOSPITAL_BASED_OUTPATIENT_CLINIC_OR_DEPARTMENT_OTHER): Payer: Medicaid Other

## 2012-09-18 ENCOUNTER — Ambulatory Visit (HOSPITAL_BASED_OUTPATIENT_CLINIC_OR_DEPARTMENT_OTHER): Payer: Medicaid Other | Admitting: Adult Health

## 2012-09-18 ENCOUNTER — Other Ambulatory Visit (HOSPITAL_BASED_OUTPATIENT_CLINIC_OR_DEPARTMENT_OTHER): Payer: Medicaid Other | Admitting: Lab

## 2012-09-18 ENCOUNTER — Encounter: Payer: Self-pay | Admitting: Oncology

## 2012-09-18 ENCOUNTER — Encounter: Payer: Self-pay | Admitting: Adult Health

## 2012-09-18 DIAGNOSIS — C50919 Malignant neoplasm of unspecified site of unspecified female breast: Secondary | ICD-10-CM

## 2012-09-18 DIAGNOSIS — Z5111 Encounter for antineoplastic chemotherapy: Secondary | ICD-10-CM

## 2012-09-18 DIAGNOSIS — C50311 Malignant neoplasm of lower-inner quadrant of right female breast: Secondary | ICD-10-CM

## 2012-09-18 DIAGNOSIS — C50319 Malignant neoplasm of lower-inner quadrant of unspecified female breast: Secondary | ICD-10-CM

## 2012-09-18 LAB — CBC WITH DIFFERENTIAL/PLATELET
BASO%: 1.8 % (ref 0.0–2.0)
EOS%: 0 % (ref 0.0–7.0)
MCH: 31.2 pg (ref 25.1–34.0)
MCHC: 34 g/dL (ref 31.5–36.0)
MONO#: 1 10*3/uL — ABNORMAL HIGH (ref 0.1–0.9)
RBC: 3.33 10*6/uL — ABNORMAL LOW (ref 3.70–5.45)
RDW: 14.9 % — ABNORMAL HIGH (ref 11.2–14.5)
WBC: 6.2 10*3/uL (ref 3.9–10.3)
lymph#: 0.6 10*3/uL — ABNORMAL LOW (ref 0.9–3.3)
nRBC: 0 % (ref 0–0)

## 2012-09-18 LAB — COMPREHENSIVE METABOLIC PANEL (CC13)
ALT: 11 U/L (ref 0–55)
AST: 17 U/L (ref 5–34)
Albumin: 3.2 g/dL — ABNORMAL LOW (ref 3.5–5.0)
Alkaline Phosphatase: 52 U/L (ref 40–150)
BUN: 11.2 mg/dL (ref 7.0–26.0)
Chloride: 109 mEq/L (ref 98–109)
Potassium: 3.8 mEq/L (ref 3.5–5.1)
Sodium: 141 mEq/L (ref 136–145)

## 2012-09-18 MED ORDER — ONDANSETRON 16 MG/50ML IVPB (CHCC)
16.0000 mg | Freq: Once | INTRAVENOUS | Status: AC
  Administered 2012-09-18: 16 mg via INTRAVENOUS

## 2012-09-18 MED ORDER — PACLITAXEL CHEMO INJECTION 300 MG/50ML
80.0000 mg/m2 | Freq: Once | INTRAVENOUS | Status: AC
  Administered 2012-09-18: 126 mg via INTRAVENOUS
  Filled 2012-09-18: qty 21

## 2012-09-18 MED ORDER — SODIUM CHLORIDE 0.9 % IJ SOLN
10.0000 mL | INTRAMUSCULAR | Status: DC | PRN
  Administered 2012-09-18: 10 mL via INTRAVENOUS
  Filled 2012-09-18: qty 10

## 2012-09-18 MED ORDER — DEXAMETHASONE SODIUM PHOSPHATE 20 MG/5ML IJ SOLN
20.0000 mg | Freq: Once | INTRAMUSCULAR | Status: AC
  Administered 2012-09-18: 20 mg via INTRAVENOUS

## 2012-09-18 MED ORDER — DIPHENHYDRAMINE HCL 50 MG/ML IJ SOLN
25.0000 mg | Freq: Once | INTRAMUSCULAR | Status: AC
  Administered 2012-09-18: 25 mg via INTRAVENOUS

## 2012-09-18 MED ORDER — HEPARIN SOD (PORK) LOCK FLUSH 100 UNIT/ML IV SOLN
500.0000 [IU] | Freq: Once | INTRAVENOUS | Status: AC
  Administered 2012-09-18: 500 [IU] via INTRAVENOUS
  Filled 2012-09-18: qty 5

## 2012-09-18 MED ORDER — SODIUM CHLORIDE 0.9 % IV SOLN
217.0000 mg | Freq: Once | INTRAVENOUS | Status: AC
  Administered 2012-09-18: 220 mg via INTRAVENOUS
  Filled 2012-09-18: qty 22

## 2012-09-18 MED ORDER — FAMOTIDINE IN NACL 20-0.9 MG/50ML-% IV SOLN
20.0000 mg | Freq: Once | INTRAVENOUS | Status: AC
  Administered 2012-09-18: 20 mg via INTRAVENOUS

## 2012-09-18 NOTE — Progress Notes (Signed)
OFFICE PROGRESS NOTE  CC  [name], MD [address] [place] [address] Dr. Everardo Beals Dr. 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 Patient had a palpable mass at the 3:00 position. She was seen at the breast Center on 06/20/2012 she had a mammogram and ultrasound showing a 1.7 cm mass at the 3:00 position. 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 Tumor was grade 3.  #2 MRIs performed on 06/26/2012 showed no biopsy mass at 3:00 but also 2 other adjacent masses at 1 at 2:00 measuring 0.8 x 1.6 x 2.2 cm andalso 6 mm ill-defined mass posterior to the biopsy-proven malignancy.  #3 patient has gone on to have a Port-A-Cath placed to begin neoadjuvant chemotherapy initially consisting of Adriamycin Cytoxan to be given dose dense for 4 cycles followed by weekly Taxol and carboplatinum for a total of 12 weeks.carefully agent made to have an excellent response to neoadjuvant chemotherapy and could possibly have breast preservation of course she may even need chest mastectomy with sentinel lymph node biopsies. Patient and I had met at the [facility] clinic we discussed the rationale for upfront chemotherapy she is now here to receive that.    CURRENT THERAPY: Taxol Carbo week 2  INTERVAL HISTORY: Nhyla R Patricelli 52 y.o. female returns for evaluation prior to Taxol/Carbo week 2.  She is doing well.  She continues to have some small ulcerations on her left buccal mucosa, but otherwise is doing well.  She denies fevers, chills, nausea, vomiting, numbness.  She has been mildly constipated which resolved with a bran muffin.  Otherwise, a 10 point ROS is neg.   MEDICAL  HISTORY: Past Medical History  Diagnosis Date  . HSV infection   . NSVD (normal spontaneous vaginal delivery)   . Hypertension     takes HCTZ  . Urinary frequency   . Anemia     takes Ferrous Sulfate occasionally  . Breast cancer 2014    triple negative    ALLERGIES:  has No Known Allergies.  MEDICATIONS:  Current Outpatient Prescriptions  Medication Sig Dispense Refill  . Alum & Mag Hydroxide-Simeth (MAGIC MOUTHWASH W/LIDOCAINE) SOLN Take 5 mL by mouth 4 (four) times daily. Swish and spit  500 mL  0  . dexamethasone (DECADRON) 4 MG tablet Take 4 mg by mouth 2 (two) times daily with a meal. Take 2 tabs every day on the day after chemo an then take 2 tabs twice a day with food for 2 days.      . ferrous sulfate 325 (65 FE) MG tablet Take 325 mg by mouth daily with breakfast.      . hydrochlorothiazide (HYDRODIURIL) 25 MG tablet Take 1 tablet (25 mg total) by mouth daily.  30 tablet  11  . lidocaine-prilocaine (EMLA) cream Apply 1 application topically as needed (apply to PAC site 1-2 hours prior to treatment.).      Marland Kitchen LORazepam (ATIVAN) 0.5 MG tablet Take 0.5 mg by mouth every 6 (six) hours as needed for anxiety (as needed for nausea and vomitting).      . ondansetron (ZOFRAN) 8 MG tablet Take 8 mg by mouth every 8 (eight) hours as needed for nausea (Take 1 pill by mouth  twice a day as needed  for nausea and vomitting starting 3rd day after chemo).      Marland Kitchen oxyCODONE-acetaminophen (ROXICET) 5-325 MG per tablet Take 1-2 tablets by mouth every 4 (four) hours as needed for pain.  30 tablet  0  . prochlorperazine (COMPAZINE) 10 MG tablet Take 10 mg by mouth every 6 (six) hours as needed (for nausea and vomitting).      . prochlorperazine (COMPAZINE) 25 MG suppository Place 25 mg rectally every 12 (twelve) hours as needed for nausea (as needed for nausea and vomitting).      Marland Kitchen UNABLE TO FIND Apply 1 application topically once. 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  1 application  0  . valACYclovir (VALTREX) 500 MG tablet Take 500 mg by mouth daily as needed. For herpes      . ciprofloxacin (CIPRO) 500 MG tablet Take 1 tablet (500 mg total) by mouth 2 (two) times daily.  14 tablet  5   No current facility-administered medications for this visit.    SURGICAL HISTORY:  Past Surgical History  Procedure Laterality Date  . Tubal ligation  2003    LTL  . Portacath placement N/A 07/06/2012    Procedure: INSERTION PORT-A-CATH;  Surgeon: Almond Lint, MD;  Location: [facility] OR;  Service: General;  Laterality: N/A;    REVIEW OF SYSTEMS:  General: fatigue (-), night sweats (-), fever (-), pain (-) Lymph: palpable nodes (-) HEENT: vision changes (-), mucositis (-), gum bleeding (-), epistaxis (-) Cardiovascular: chest pain (-), palpitations (-) Pulmonary: shortness of breath (-), dyspnea on exertion (-), cough (-), hemoptysis (-) GI:  Early satiety (-), melena (-), dysphagia (-), nausea/vomiting (-), diarrhea (-) GU: dysuria (-), hematuria (-), incontinence (-) Musculoskeletal: joint swelling (-), joint pain (-), back pain (-) Neuro: weakness (-), numbness (-), headache (-), confusion (-) Skin: Rash (-), lesions (-), dryness (-) Psych: depression (-), suicidal/homicidal ideation (-), feeling of hopelessness (-)   PHYSICAL EXAMINATION: There were no vitals taken for this visit. There is no weight on file to calculate BMI. General: Patient is a well appearing female in no acute distress HEENT: PERRLA, sclerae anicteric no conjunctival pallor, MMM, small ulceration under tongue on right and left lateral sides.  Neck: supple, no palpable adenopathy Lungs: clear to auscultation bilaterally, no wheezes, rhonchi, or rales Cardiovascular: regular rate rhythm, S1, S2, no murmurs, rubs or gallops Abdomen: Soft, non-tender, non-distended, normoactive bowel sounds, no HSM Extremities: warm and well perfused, no clubbing, cyanosis, or edema Skin:  No rashes or lesions Neuro: Non-focal Breast examination: Left breast no masses or nipple discharge Right breast reveals 3 discrete masses at the 3:00 position 2:00 position.  It is nontender there is no nipple discharge. Conglomeration of these masses to my exam measure about 2-3cm. ECOG PERFORMANCE STATUS: 0 - Asymptomatic  LABORATORY DATA: Lab Results  Component Value Date   WBC 6.2 09/18/2012   HGB 10.4* 09/18/2012   HCT 30.6* 09/18/2012   MCV 91.9 09/18/2012   PLT 346 09/18/2012      Chemistry      Component Value Date/Time   NA 141 09/18/2012 1139   NA 137 06/06/2012 0853   K 3.8 09/18/2012 1139   K 3.8 06/06/2012 0853   CL 104 07/17/2012 1357   CL 102 06/06/2012 0853   CO2 25 09/18/2012 1139   CO2 26 06/06/2012 0853   BUN 11.2 09/18/2012 1139   BUN 12 06/06/2012 0853  CREATININE 0.6 09/18/2012 1139   CREATININE 0.70 06/06/2012 0853   CREATININE 0.85 04/24/2009 1114      Component Value Date/Time   CALCIUM 8.7 09/18/2012 1139   CALCIUM 9.3 06/06/2012 0853   ALKPHOS 52 09/18/2012 1139   ALKPHOS 35* 06/06/2012 0853   AST 17 09/18/2012 1139   AST 14 06/06/2012 0853   ALT 11 09/18/2012 1139   ALT 9 06/06/2012 0853   BILITOT 0.31 09/18/2012 1139   BILITOT 0.8 06/06/2012 0853     ADDITIONAL INFORMATION: PROGNOSTIC INDICATORS - ACIS Results: IMMUNOHISTOCHEMICAL AND MORPHOMETRIC ANALYSIS BY THE AUTOMATED CELLULAR IMAGING SYSTEM (ACIS) Estrogen Receptor: 0%, NEGATIVE Progesterone Receptor: 0%, NEGATIVE Proliferation Marker Ki67: 95% COMMENT: The negative hormone receptor study(ies) in this case have an internal positive control. REFERENCE RANGE ESTROGEN RECEPTOR NEGATIVE <1% POSITIVE =>1% PROGESTERONE RECEPTOR NEGATIVE <1% POSITIVE =>1% All controls stained appropriately Pecola Leisure MD Pathologist, Electronic Signature ( Signed 06/28/2012) CHROMOGENIC IN-SITU HYBRIDIZATION Results: HER-2/NEU BY CISH - NO AMPLIFICATION OF HER-2 DETECTED. RESULT RATIO OF HER2: CEP 17 SIGNALS  1.26 1 of 3 Duplicate copy FINAL for AQUA, [place] [phone]) ADDITIONAL INFORMATION:(continued) AVERAGE HER2 COPY NUMBER PER CELL 2.45 REFERENCE RANGE NEGATIVE HER2/Chr17 Ratio <2.0 and Average HER2 copy number <4.0 EQUIVOCAL HER2/Chr17 Ratio <2.0 and Average HER2 copy number 4.0 and <6.0 POSITIVE HER2/Chr17 Ratio >=2.0 and/or Average HER2 copy number >=6.0 Jimmy Picket MD Pathologist, Electronic Signature ( Signed 06/26/2012) FINAL DIAGNOSIS Diagnosis Breast, right, needle core biopsy, 3:00 position - INVASIVE DUCTAL CARCINOMA SEE COMMENT. - DUCTAL CARCINOMA IN SITU Microscopic Comment Although the grade of tumor is best assessed at resection, with these biopsies, both the invasive and in situ carcinoma are grade III. Breast prognostic studies are pending and will be reported in an addendum. The case is reviewed with Dr. Raynald Blend who concurs. (CRR:gt, 06/21/12) [place] RUND DO Pathologist, Electronic FINAL DIAGNOSIS Diagnosis Breast, right, needle core biopsy, mass - BENIGN BREAST TISSUE WITH FOCAL PSEUDOANGIOMATOUS STROMAL HYPERPLASIA (PASH). NO EVIDENCE OF MALIGNANCY. Microscopic Comment Called to the [facility] on 07/13/12. (JDP:caf 07/13/12)  RADIOGRAPHIC STUDIES:  Dg Chest 2 View  07/04/2012   *RADIOLOGY REPORT*  Clinical Data: Insertion of Port-A-Cath.  Breast cancer. Hypertension.  CHEST - 2 VIEW  Comparison: CT of the chest on 07/03/2012  Findings: Cardiomediastinal silhouette is within normal limits. The lungs are free of focal consolidations and pleural effusions. Bony structures have a normal appearance.  Contrast is identified within the colon following CT of the abdomen.  IMPRESSION: Negative exam.   Original Report Authenticated By: Norva Pavlov, M.D.   Ct Chest W Contrast  07/03/2012   *RADIOLOGY REPORT*  Clinical Data:  Breast cancer.  No therapy yet.  Lower inner quadrant of right breast.  CT CHEST, ABDOMEN AND PELVIS WITH CONTRAST  Technique:  Contiguous axial images of the chest abdomen and pelvis were obtained after IV contrast administration.  Contrast: 80 ml Omnipaque-300  Comparison: Breast MR of 06/26/2012.  No prior CTs.  CT CHEST  Findings: Lung windows demonstrate minimal left lower lobe subpleural nodularity at 3 mm on image 40/series 4.  Right lung clear.  Soft tissue windows demonstrate small bilateral axillary nodes, without adenopathy.  No subpectoral adenopathy. No supraclavicular adenopathy.  Normal heart size, without pericardial effusion.  No central pulmonary embolism, on this non-dedicated study.  No mediastinal or hilar adenopathy.  No internal mammary adenopathy. Minimal residual thymic tissue in the anterior mediastinum (image 17/series 2).  IMPRESSION:  1. No acute process or evidence  of metastatic disease in the chest. 2.  Probable subpleural lymph node in the left lower lobe. Recommend attention on follow-up.  CT ABDOMEN AND PELVIS  Findings:  Mild early phase of hepatic enhancement which decreases sensitivity for focal liver lesions.  None identified.  Normal spleen, stomach, pancreas, gallbladder, biliary tract, adrenal glands, kidneys. No retroperitoneal or retrocrural adenopathy.  Normal colon, appendix, and terminal ileum.  Normal small bowel without abdominal ascites.  No pelvic adenopathy.    Normal urinary bladder and uterus.  No adnexal mass.  Trace free pelvic fluid is likely physiologic.  Transitional right-sided lumbosacral anatomy.  Degenerative disc disease at the lumbosacral junction.  IMPRESSION:  No acute process or evidence of metastatic disease in the abdomen or pelvis.   Original Report Authenticated By: Jeronimo Greaves, M.D.   US Breast Right  06/29/2012   *RADIOLOGY REPORT*  Clinical Data:  52 year old female with newly diagnosed right breast cancer.  Suspicious abnormal enhancement anterior and superior to the biopsy-proven neoplasm - for second look ultrasound evaluation.  RIGHT BREAST ULTRASOUND   Comparison:  Prior mammograms, ultrasounds and MRI.  Findings: Ultrasound is performed, showing the biopsy-proven neoplasm at the 3 o'clock position of the right breast 4 cm from the nipple. No other discrete sonographic abnormalities are identified, specifically medial, superior, and anterior to the biopsy-proven neoplasm.  IMPRESSION: Patchy abnormal enhancement medial superior to the biopsy-proven neoplasm identified on recent MRI is not visualized sonographically as a discrete abnormality.  MR guided biopsy is recommended if breast conservation surgery is considered.  BI-RADS CATEGORY 6:  Known biopsy-proven malignancy - appropriate action should be taken.  RECOMMENDATION: MR guided right breast biopsy, which will be scheduled by our office.  I have discussed the findings and recommendations with the patient. Results were also provided in writing at the conclusion of the visit.  If applicable, a reminder letter will be sent to the patient regarding the next appointment.   Original Report Authenticated By: Harmon Pier, M.D.   US Breast Right  06/20/2012   *RADIOLOGY REPORT*  Clinical Data:  52 year old female for annual bilateral mammograms and palpable mass in the upper and inner right breast discovered on clinical examination.  DIGITAL DIAGNOSTIC BILATERAL MAMMOGRAM WITH CAD AND RIGHT BREAST ULTRASOUND:  Comparison:  03/21/2009 and prior mammograms dating back to 01/21/2005  Findings:  ACR Breast Density Category 4: The breast tissue is extremely dense.  Routine and spot compression views of the right breast demonstrate no evidence of suspicious mass or distortion. Scattered punctate calcifications within the breasts bilaterally are again identified.  Mammographic images were processed with CAD.  On physical exam, thickening is identified at the 12 o'clock position of the right breast 4 cm from the nipple. A firm palpable mass identified at the 3 o'clock position of the right breast 4 cm from the nipple.   Ultrasound is performed, showing no evidence of solid or cystic mass, distortion or abnormal areas of shadowing at the 12 o'clock position of the right breast. A 7 x 6 x 17 mm slightly irregular oval hypoechoic mass with calcifications is horizontally oriented at the 3 o'clock position of the right breast 4 cm from the nipple.  IMPRESSION: Indeterminate 7 x 6 x 17 mm hypoechoic mass at the 3 o'clock position of the right breast.  Tissue sampling is recommended.  No mammographic evidence of breast malignancy bilaterally.  No mammographic, palpable or sonographic abnormality at the 12 o'clock position of the right breast, in the area of palpable concern.  BI-RADS CATEGORY 4:  Suspicious abnormality - biopsy should be considered.  RECOMMENDATION:  Ultrasound guided right breast biopsy, which will be performed today but dictated in a separate report.  I have discussed the findings and recommendations with the patient. Results were also provided in writing at the conclusion of the visit.  If applicable, a reminder letter will be sent to the patient regarding the next appointment.   Original Report Authenticated By: Harmon Pier, M.D.   Ct Abdomen Pelvis W Contrast  07/03/2012   *RADIOLOGY REPORT*  Clinical Data:  Breast cancer.  No therapy yet.  Lower inner quadrant of right breast.  CT CHEST, ABDOMEN AND PELVIS WITH CONTRAST  Technique: Contiguous axial images of the chest abdomen and pelvis were obtained after IV contrast administration.  Contrast: 80 ml Omnipaque-300  Comparison: Breast MR of 06/26/2012.  No prior CTs.  CT CHEST  Findings: Lung windows demonstrate minimal left lower lobe subpleural nodularity at 3 mm on image 40/series 4.  Right lung clear.  Soft tissue windows demonstrate small bilateral axillary nodes, without adenopathy.  No subpectoral adenopathy. No supraclavicular adenopathy.  Normal heart size, without pericardial effusion.  No central pulmonary embolism, on this non-dedicated study.  No  mediastinal or hilar adenopathy.  No internal mammary adenopathy. Minimal residual thymic tissue in the anterior mediastinum (image 17/series 2).  IMPRESSION:  1. No acute process or evidence of metastatic disease in the chest. 2.  Probable subpleural lymph node in the left lower lobe. Recommend attention on follow-up.  CT ABDOMEN AND PELVIS  Findings:  Mild early phase of hepatic enhancement which decreases sensitivity for focal liver lesions.  None identified.  Normal spleen, stomach, pancreas, gallbladder, biliary tract, adrenal glands, kidneys. No retroperitoneal or retrocrural adenopathy.  Normal colon, appendix, and terminal ileum.  Normal small bowel without abdominal ascites.  No pelvic adenopathy.    Normal urinary bladder and uterus.  No adnexal mass.  Trace free pelvic fluid is likely physiologic.  Transitional right-sided lumbosacral anatomy.  Degenerative disc disease at the lumbosacral junction.  IMPRESSION:  No acute process or evidence of metastatic disease in the abdomen or pelvis.   Original Report Authenticated By: Jeronimo Greaves, M.D.   Mr Breast Right W Wo Contrast  07/12/2012   *RADIOLOGY REPORT*  Clinical Data:  Known invasive mammary carcinoma in the right breast at 3 o'clock posteriorly.  Additional site of abnormal enhancement in the right upper inner quadrant closer to the nipple for possible biopsy.  ATTEMPTED MRI GUIDED VACUUM ASSISTED BIOPSY OF THE RIGHT BREAST WITHOUT AND WITH CONTRAST  Comparison: Previous exams.  Technique: Multiplanar, multisequence MR images of the right breast were obtained prior to and following the intravenous administration of 9 ml of Mulithance.  I met with the patient, and we discussed the procedure of MRI guided biopsy, including risks, benefits, and alternatives. Specifically, we discussed the risks of infection, bleeding, tissue injury, clip migration, and inadequate sampling.  Informed, written consent was given.  Using sterile technique, 2% Lidocaine,  MRI guidance, and a 9 gauge vacuum assisted device, biopsy was attempted using a mediolateral approach.  However, the procedure was unsuccessful due to the extremely thin breast compression.  IMPRESSION:  Unsuccessful MRI guided vacuum-assisted biopsy of the right breast.  Patient was sent to the [facility] for further evaluation with ultrasound.   Original Report Authenticated By: Cain Saupe, M.D.   Mr Breast Bilateral W Wo Contrast  06/26/2012   *RADIOLOGY REPORT*  Clinical Data: History of new diagnosis  of breast cancer from recent ultrasound guided core needle biopsy right breast 06/20/2012 demonstrating invasive ductal carcinoma with DCIS.  GFR 89, creatinine 0.7 on 06/06/2012.  BILATERAL BREAST MRI WITH AND WITHOUT CONTRAST  Technique: Multiplanar, multisequence MR images of both breasts were obtained prior to and following the intravenous administration of 10ml of multihance.  Three dimensional images were evaluated at the independent DynaCad workstation.  Comparison:  Recent mammograms and ultrasound.  Findings: Examination demonstrates mild background parenchymal enhancement.  Right breast:  There is evidence of an ovoid gently lobulated and somewhat ill-defined heterogeneously enhancing mass over the deep third of the inner midportion of the right breast representing patient's biopsy-proven malignancy.  This mass contains central clip artifact and measures approximately 0.9 x 1.9 x 1.0 cm. The anterior border of this mass is located 5.5 cm from the nipple. There is a 6 mm slightly ill-defined enhancing ovoid mass 6 mm posterior lateral to the biopsy-proven malignancy lying just superficial to the pectoralis muscle.  There is also clumped non mass enhancement anterior and slightly superior to the biopsy- proven malignancy in the middle third at approximately the 2 o'clock position.  This measures approximately 0.8 x 1.6 x 1.2 cm and is located 3 cm from the nipple.  This is suspicious for  multifocal disease.  No suspicious axillary or internal mammary lymph nodes.  Left breast:  No suspicious mass, enhancement or adenopathy.  IMPRESSION: Known biopsy-proven malignancy over the deep third of the inner mid right breast.  6 mm ill-defined enhancing ovoid mass 6 mm posterior lateral to the biopsy-proven malignancy and lying just superficial to the pectoralis muscle.  Clumped non mass enhancement anterior and superior to patient's known malignancy at approximately the 2 o'clock position measuring 0.8 x 1.6 x 2.2 cm.  Findings likely represent multi focal disease.  RECOMMENDATION: Recommend second look ultrasound and core biopsy if feasible of the clumped non mass enhancement at approximately the 2 o'clock position right breast anterior/superior to the known malignancy. If not seen by ultrasound, recommend proceeding to MR guided biopsy of this suspicious abnormality.  THREE-DIMENSIONAL MR IMAGE RENDERING ON INDEPENDENT WORKSTATION:  Three-dimensional MR images were rendered by post-processing of the original MR data on an independent workstation.  The three- dimensional MR images were interpreted, and findings were reported in the accompanying complete MRI report for this study.  BI-RADS CATEGORY 0:  Incomplete.  Need additional imaging evaluation and/or prior mammograms for comparison.   Original Report Authenticated By: Elberta Fortis, M.D.   Dg Chest Port 1 View  07/06/2012   *RADIOLOGY REPORT*  Clinical Data: Post left-sided port catheter insertion  PORTABLE CHEST - 1 VIEW  Comparison: 07/04/2012  Findings:  Grossly unchanged cardiac silhouette and mediastinal contours. Interval placement of a left anterior chest wall subclavian vein approach port-a-catheter with tip projecting over the superior cavoatrial junction. Grossly unchanged symmetric biapical pleural parenchymal thickening.  No pneumothorax.  No focal airspace opacities.  No pleural effusion.  No evidence of edema.  Unchanged bones.   IMPRESSION: Interval placement of a left anterior chest wall subclavian vein approach port-a-catheter with tip overlying the superior cavoatrial junction.  No pneumothorax.   Original Report Authenticated By: Tacey Ruiz, MD   Mm Digital Diagnostic Bilat  06/20/2012   *RADIOLOGY REPORT*  Clinical Data:  52 year old female for annual bilateral mammograms and palpable mass in the upper and inner right breast discovered on clinical examination.  DIGITAL DIAGNOSTIC BILATERAL MAMMOGRAM WITH CAD AND RIGHT BREAST ULTRASOUND:  Comparison:  03/21/2009 and prior mammograms dating back to 01/21/2005  Findings:  ACR Breast Density Category 4: The breast tissue is extremely dense.  Routine and spot compression views of the right breast demonstrate no evidence of suspicious mass or distortion. Scattered punctate calcifications within the breasts bilaterally are again identified.  Mammographic images were processed with CAD.  On physical exam, thickening is identified at the 12 o'clock position of the right breast 4 cm from the nipple. A firm palpable mass identified at the 3 o'clock position of the right breast 4 cm from the nipple.  Ultrasound is performed, showing no evidence of solid or cystic mass, distortion or abnormal areas of shadowing at the 12 o'clock position of the right breast. A 7 x 6 x 17 mm slightly irregular oval hypoechoic mass with calcifications is horizontally oriented at the 3 o'clock position of the right breast 4 cm from the nipple.  IMPRESSION: Indeterminate 7 x 6 x 17 mm hypoechoic mass at the 3 o'clock position of the right breast.  Tissue sampling is recommended.  No mammographic evidence of breast malignancy bilaterally.  No mammographic, palpable or sonographic abnormality at the 12 o'clock position of the right breast, in the area of palpable concern.  BI-RADS CATEGORY 4:  Suspicious abnormality - biopsy should be considered.  RECOMMENDATION:  Ultrasound guided right breast biopsy, which will be  performed today but dictated in a separate report.  I have discussed the findings and recommendations with the patient. Results were also provided in writing at the conclusion of the visit.  If applicable, a reminder letter will be sent to the patient regarding the next appointment.   Original Report Authenticated By: Harmon Pier, M.D.   Mm Digital Diagnostic Unilat R  07/12/2012   *RADIOLOGY REPORT*  Clinical Data:  Ultrasound-guided core needle biopsy of a hypoechoic area at 2 o'clock 2 cm from the right nipple with clip placement.  Known invasive mammary carcinoma in the right breast. Unsuccessful right breast MRI guided biopsy earlier today.  DIGITAL DIAGNOSTIC RIGHT MAMMOGRAM  Comparison:  Previous exams.  Findings:  Films are performed following ultrasound guided biopsy of a small hypoechoic area at 2 o'clock 2 cm from the right nipple. The ribbon clip is positioned within the area of concern. The ribbon clip is approximately 2.6 cm anterior to the top hat clip from the original biopsy on 06/20/2012.  IMPRESSION: Appropriate clip placement following ultrasound-guided core needle biopsy of a hypoechoic area at 2 o'clock 2 cm in the right nipple.   Original Report Authenticated By: Cain Saupe, M.D.   Mm Digital Diagnostic Unilat R  06/20/2012   *RADIOLOGY REPORT*  Clinical Data:  52 year old female - evaluate clip placement following ultrasound guided right breast biopsy.  DIGITAL DIAGNOSTIC RIGHT MAMMOGRAM  Comparison:  Previous exams.  Findings:  Films are performed following ultrasound guided biopsy of the 7 x 6 x 17 mm hypoechoic mass at the 3 o'clock position of the right breast 4 cm from the nipple. The T shaped biopsy clip is in satisfactory position.  IMPRESSION: Satisfactory clip placement following ultrasound guided right breast biopsy.  Pathology will be followed.   Original Report Authenticated By: Harmon Pier, M.D.   Dg Fluoro Guide Cv Line-no Report  07/06/2012   CLINICAL DATA: port  placement   FLOURO GUIDE CV LINE  Fluoroscopy was utilized by the requesting physician.  No radiographic  interpretation.    [place] Rt Breast Bx W Loc Dev 1st Lesion Img Bx Spec US Guide  07/13/2012   **ADDENDUM** CREATED: 07/13/2012 14:11:45  I spoke with the patient by telephone on 07/13/2012 to discuss pathology results.  Pathology demonstrates  pseudoangiomatous stromal hyperplasia.  The findings were called to the office of Dr. Donell Beers. Although the imaging findings could be related to pseudoangiomatous stromal hyperplasia, excision of the anterior biopsy site is suggested at the time of patient's definitive surgery.  The patient is considering unilateral or bilateral mastectomy at this time.  The patient reports no problems at the biopsy site.  All questions were answered.  Recommendations:  Excision of the biopsy site. The patient is scheduled to begin chemotherapy in 4 days.  **END ADDENDUM** SIGNED BY: Blair Hailey. Manson Passey, M.D.  07/12/2012   *RADIOLOGY REPORT*  Clinical Data:  Unsuccessful MRI guided biopsy of the area of abnormal enhancement in the left anterior upper inner quadrant, anterior to the known invasive mammary carcinoma.  Second look ultrasound performed and a 5 x 4 x 4 mm hypoechoic area found at 2 o'clock 2 cm from the right nipple which will be biopsied.  ULTRASOUND GUIDED VACUUM ASSISTED CORE BIOPSY OF THE RIGHT BREAST  Sonography of the right upper inner quadrant was performed.  There is an ill-defined hypoechoic 5 x 4 x 4 mm area at 2 o'clock 2 cm from the right nipple which will be biopsied with ultrasound guidance.  The patient and I discussed the procedure of ultrasound-guided biopsy, including benefits and alternatives.  We discussed the high likelihood of a successful procedure. We discussed the risks of the procedure including infection, bleeding, tissue injury, clip migration, and inadequate sampling.  Written informed consent was given.  Using sterile technique, 2% lidocaine,  ultrasound guidance, and a 12 gauge vacuum assisted needle, biopsy was performed of the hypoechoic area at 2 o'clock 2 cm from the right nipple using a caudocranial approach.  At the conclusion of the procedure, a within tissue marker clip was deployed into the biopsy cavity. Follow-up 2-view mammogram was performed and dictated separately.  IMPRESSION: Ultrasound-guided biopsy of a 5 x 4 x 4 mm hypoechoic area at 2 o'clock 2 cm from the right nipple.  No apparent complications.   Original Report Authenticated By: Cain Saupe, M.D.   [place] Rt Breast Bx W Loc Dev 1st Lesion Img Bx Spec US Guide  06/21/2012   *RADIOLOGY REPORT*  Clinical Data:  Indeterminate 7 x 6 x 17 mm hypoechoic mass in the inner right breast - for tissue sampling.  ULTRASOUND GUIDED VACUUM ASSISTED CORE BIOPSY OF THE RIGHT BREAST  Comparison: Previous exams.  I met with the patient and we discussed the procedure of ultrasound- guided biopsy, including benefits and alternatives.  We discussed the high likelihood of a successful procedure. We discussed the risks of the procedure including infection, bleeding, tissue injury, clip migration, and inadequate sampling.  Informed written consent was given.  Using sterile technique, 2% lidocaine ultrasound guidance and a 12 gauge vacuum assisted needle biopsy was performed of the 7 x 6 x 17 mm hypoechoic mass at the 3 o'clock position of the right breast 4 cm from the nipple using a lateral approach.  At the conclusion of the procedure, a T shaped tissue marker clip was deployed into the biopsy cavity.  Follow-up 2-view mammogram was performed and dictated separately.  IMPRESSION: Ultrasound-guided biopsy of right breast mass.  No apparent complications.  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 Histology correlates with imaging findings.  The patient was contacted by phone on 06/21/2012 and these  results given to her which she understood. Her questions were answered. She was  encouraged to obtain breast cancer educational material from [facility] at her convenience. The patient had no complaints with her biopsy site.  Recommend surgery/oncology consultation.  An appointment at the [facility] has been scheduled for 06/28/2012. Recommend bilateral breast MRI, which has been scheduled for 06/26/2012. The patient was informed of these appointments.   Original Report Authenticated By: Harmon Pier, M.D.    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 Patient had other areas seen on MRI biopsy of the second mass was negative. Patient has had her Port-A-Cath placed. She is proceeding neoadjuvant chemotherapy to possibly conserve her breast. Chemotherapy will consist of Adriamycin Cytoxan given dose dense for 4 cycles starting today 07/17/2012. She will receive Neulasta support on day 2 for each cycle. When she completes this then she will begin weekly Taxol and carboplatinum for a total of 12 weeks.  #2 we discussed risks benefits and side effects of chemotherapy. She has had an echocardiogram as well as chemotherapy teaching class. Port-A-Cath was placed by Dr. Donell Beers. The port is functioning well.  #3 Taxol/Carbo cycle 2.      PLAN:  #1 Ms. Kingsford is doing well today.  Her labs have recovered.  She will proceed with Taxol/Carbo today.  I decreased her benadryl dose at her request.    #2 She will return next week for her third cycle of Taxol/Carbo.    #3 Patient is to continue magic mouthwash four times a day, salt water rinses, biotene and avoid acidic/spicy foods.    All questions were answered. The patient knows to call the clinic with any problems, questions or concerns. We can certainly see the patient much sooner if necessary.  I spent 25 minutes counseling the patient face to face. The total time spent in the appointment was 30 minutes.   Cherie Ouch  Lyn Hollingshead, NP Medical Oncology [facility] Phone: [phone] 09/19/2012, 4:06 PM

## 2012-09-18 NOTE — Patient Instructions (Addendum)
[facility] Discharge Instructions for Patients Receiving Chemotherapy  Today you received the following chemotherapy agents Taxol and Carboplatin.  To help prevent nausea and vomiting after your treatment, we encourage you to take your nausea medication.   If you develop nausea and vomiting that is not controlled by your nausea medication, call the clinic.   BELOW ARE SYMPTOMS THAT SHOULD BE REPORTED IMMEDIATELY:  *FEVER GREATER THAN 100.5 F  *CHILLS WITH OR WITHOUT FEVER  NAUSEA AND VOMITING THAT IS NOT CONTROLLED WITH YOUR NAUSEA MEDICATION  *UNUSUAL SHORTNESS OF BREATH  *UNUSUAL BRUISING OR BLEEDING  TENDERNESS IN MOUTH AND THROAT WITH OR WITHOUT PRESENCE OF ULCERS  *URINARY PROBLEMS  *BOWEL PROBLEMS  UNUSUAL RASH Items with * indicate a potential emergency and should be followed up as soon as possible.  Feel free to call the clinic you have any questions or concerns. The clinic phone number is [phone].    

## 2012-09-26 ENCOUNTER — Ambulatory Visit (HOSPITAL_BASED_OUTPATIENT_CLINIC_OR_DEPARTMENT_OTHER): Payer: Medicaid Other | Admitting: Oncology

## 2012-09-26 ENCOUNTER — Encounter: Payer: Self-pay | Admitting: Oncology

## 2012-09-26 ENCOUNTER — Telehealth: Payer: Self-pay | Admitting: *Deleted

## 2012-09-26 ENCOUNTER — Telehealth: Payer: Self-pay | Admitting: Oncology

## 2012-09-26 ENCOUNTER — Other Ambulatory Visit (HOSPITAL_BASED_OUTPATIENT_CLINIC_OR_DEPARTMENT_OTHER): Payer: Medicaid Other | Admitting: Lab

## 2012-09-26 VITALS — BP 117/78 | HR 90 | Temp 99.9°F | Resp 18 | Ht 63.0 in | Wt 118.7 lb

## 2012-09-26 DIAGNOSIS — C50311 Malignant neoplasm of lower-inner quadrant of right female breast: Secondary | ICD-10-CM

## 2012-09-26 DIAGNOSIS — A6000 Herpesviral infection of urogenital system, unspecified: Secondary | ICD-10-CM

## 2012-09-26 DIAGNOSIS — Z171 Estrogen receptor negative status [ER-]: Secondary | ICD-10-CM

## 2012-09-26 DIAGNOSIS — C50919 Malignant neoplasm of unspecified site of unspecified female breast: Secondary | ICD-10-CM

## 2012-09-26 LAB — CBC WITH DIFFERENTIAL/PLATELET
BASO%: 0.8 % (ref 0.0–2.0)
Basophils Absolute: 0 10*3/uL (ref 0.0–0.1)
EOS%: 0 % (ref 0.0–7.0)
HGB: 10.3 g/dL — ABNORMAL LOW (ref 11.6–15.9)
MCH: 32.6 pg (ref 25.1–34.0)
MCHC: 35.2 g/dL (ref 31.5–36.0)
MCV: 92.4 fL (ref 79.5–101.0)
MONO%: 15.1 % — ABNORMAL HIGH (ref 0.0–14.0)
NEUT%: 71.6 % (ref 38.4–76.8)
RDW: 15.8 % — ABNORMAL HIGH (ref 11.2–14.5)

## 2012-09-26 LAB — COMPREHENSIVE METABOLIC PANEL (CC13)
AST: 26 U/L (ref 5–34)
Alkaline Phosphatase: 44 U/L (ref 40–150)
BUN: 10.9 mg/dL (ref 7.0–26.0)
Creatinine: 0.7 mg/dL (ref 0.6–1.1)
Potassium: 3.9 mEq/L (ref 3.5–5.1)

## 2012-09-26 NOTE — Progress Notes (Signed)
OFFICE PROGRESS NOTE  CC  [name], MD [address] [place] [address] Dr. Everardo Beals Dr. 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 Patient had a palpable mass at the 3:00 position. She was seen at the breast Center on 06/20/2012 she had a mammogram and ultrasound showing a 1.7 cm mass at the 3:00 position. 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 Tumor was grade 3.  #2 MRIs performed on 06/26/2012 showed no biopsy mass at 3:00 but also 2 other adjacent masses at 1 at 2:00 measuring 0.8 x 1.6 x 2.2 cm andalso 6 mm ill-defined mass posterior to the biopsy-proven malignancy.  #3 patient has gone on to have a Port-A-Cath placed to begin neoadjuvant chemotherapy initially consisting of Adriamycin Cytoxan to be given dose dense for 4 cycles followed by weekly Taxol and carboplatinum for a total of 12 weeks.carefully agent made to have an excellent response to neoadjuvant chemotherapy and could possibly have breast preservation of course she may even need chest mastectomy with sentinel lymph node biopsies. Patient and I had met at the [facility] clinic we discussed the rationale for upfront chemotherapy she is now here to receive that.   #4 patient is now status post Adriamycin Cytoxan given dose dense for 4 cycles administered from 07/17/2012 through 09/04/2012 neoadjuvant bleeding.  #5 she was then begun on weekly Taxol carboplatinum beginning 09/11/2012  CURRENT THERAPY: Taxol Carbo week 3  INTERVAL HISTORY: Jaime Murillo 52 y.o. female returns for evaluation prior to Taxol/Carbo week 3.  She is doing well.  She is complaining of having some vaginal burning  with some herpetic outbreak. She did begin Valtrex twice a day. I have encouraged her to continue taking this for another 2 days and then to take suppressive therapy on a daily basis especially when she is getting chemotherapy. She has noticed more changes in her nails. She is also having headaches off-and-on no double vision no blurring of vision. She does take Tylenol or ibuprofen and the headaches subsided..  She denies fevers, chills, nausea, vomiting, numbness.  She has been mildly constipated which resolved with a bran muffin.  Otherwise, a 10 point ROS is neg.   MEDICAL HISTORY: Past Medical History  Diagnosis Date  . HSV infection   . NSVD (normal spontaneous vaginal delivery)   . Hypertension     takes HCTZ  . Urinary frequency   . Anemia     takes Ferrous Sulfate occasionally  . Breast cancer 2014    triple negative    ALLERGIES:  has No Known Allergies.  MEDICATIONS:  Current Outpatient Prescriptions  Medication Sig Dispense Refill  . Alum & Mag Hydroxide-Simeth (MAGIC MOUTHWASH W/LIDOCAINE) SOLN Take 5 mL by mouth 4 (four) times daily. Swish and spit  500 mL  0  . dexamethasone (DECADRON) 4 MG tablet Take 4 mg by mouth 2 (two) times daily with a meal. Take 2 tabs every day on the day after chemo an then take 2 tabs twice a day with food for 2 days.      Marland Kitchen lidocaine-prilocaine (EMLA) cream Apply 1 application topically as needed (apply to PAC site 1-2 hours prior to treatment.).      Marland Kitchen LORazepam (  ATIVAN) 0.5 MG tablet Take 0.5 mg by mouth every 6 (six) hours as needed for anxiety (as needed for nausea and vomitting).      . ondansetron (ZOFRAN) 8 MG tablet Take 8 mg by mouth every 8 (eight) hours as needed for nausea (Take 1 pill by mouth twice a day as needed  for nausea and vomitting starting 3rd day after chemo).      Marland Kitchen UNABLE TO FIND Apply 1 application topically once. 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  1 application   0  . valACYclovir (VALTREX) 500 MG tablet Take 500 mg by mouth daily as needed. For herpes      . ciprofloxacin (CIPRO) 500 MG tablet Take 1 tablet (500 mg total) by mouth 2 (two) times daily.  14 tablet  5  . ferrous sulfate 325 (65 FE) MG tablet Take 325 mg by mouth daily with breakfast.      . hydrochlorothiazide (HYDRODIURIL) 25 MG tablet Take 1 tablet (25 mg total) by mouth daily.  30 tablet  11  . oxyCODONE-acetaminophen (ROXICET) 5-325 MG per tablet Take 1-2 tablets by mouth every 4 (four) hours as needed for pain.  30 tablet  0  . prochlorperazine (COMPAZINE) 10 MG tablet Take 10 mg by mouth every 6 (six) hours as needed (for nausea and vomitting).      . prochlorperazine (COMPAZINE) 25 MG suppository Place 25 mg rectally every 12 (twelve) hours as needed for nausea (as needed for nausea and vomitting).       No current facility-administered medications for this visit.    SURGICAL HISTORY:  Past Surgical History  Procedure Laterality Date  . Tubal ligation  2003    LTL  . Portacath placement N/A 07/06/2012    Procedure: INSERTION PORT-A-CATH;  Surgeon: Almond Lint, MD;  Location: [facility] OR;  Service: General;  Laterality: N/A;    REVIEW OF SYSTEMS:  General: fatigue (-), night sweats (-), fever (-), pain (-) Lymph: palpable nodes (-) HEENT: vision changes (-), mucositis (-), gum bleeding (-), epistaxis (-) Cardiovascular: chest pain (-), palpitations (-) Pulmonary: shortness of breath (-), dyspnea on exertion (-), cough (-), hemoptysis (-) GI:  Early satiety (-), melena (-), dysphagia (-), nausea/vomiting (-), diarrhea (-) GU: dysuria (-), hematuria (-), incontinence (-) Musculoskeletal: joint swelling (-), joint pain (-), back pain (-) Neuro: weakness (-), numbness (-), headache (-), confusion (-) Skin: Rash (-), lesions (-), dryness (-) Psych: depression (-), suicidal/homicidal ideation (-), feeling of hopelessness (-)   PHYSICAL EXAMINATION: Blood pressure 117/78, pulse 90,  temperature 99.9 F (37.7 C), temperature source Oral, resp. rate 18, height 5\' 3"  (1.6 m), weight 118 lb 11.2 oz (53.842 kg). Body mass index is 21.03 kg/(m^2). General: Patient is a well appearing female in no acute distress HEENT: PERRLA, sclerae anicteric no conjunctival pallor, MMM, small ulceration under tongue on right and left lateral sides.  Neck: supple, no palpable adenopathy Lungs: clear to auscultation bilaterally, no wheezes, rhonchi, or rales Cardiovascular: regular rate rhythm, S1, S2, no murmurs, rubs or gallops Abdomen: Soft, non-tender, non-distended, normoactive bowel sounds, no HSM Extremities: warm and well perfused, no clubbing, cyanosis, or edema Skin: No rashes or lesions Neuro: Non-focal Breast examination: Left breast no masses or nipple discharge Right breast reveals 3 discrete masses at the 3:00 position 2:00 position.  It is nontender there is no nipple discharge. Conglomeration of these masses to my exam measure about 2-3cm. ECOG PERFORMANCE STATUS: 0 -  Asymptomatic  LABORATORY DATA: Lab Results  Component Value Date   WBC 5.3 09/26/2012   HGB 10.3* 09/26/2012   HCT 29.2* 09/26/2012   MCV 92.4 09/26/2012   PLT 223 09/26/2012      Chemistry      Component Value Date/Time   NA 137 09/26/2012 1031   NA 137 06/06/2012 0853   K 3.9 09/26/2012 1031   K 3.8 06/06/2012 0853   CL 104 07/17/2012 1357   CL 102 06/06/2012 0853   CO2 26 09/26/2012 1031   CO2 26 06/06/2012 0853   BUN 10.9 09/26/2012 1031   BUN 12 06/06/2012 0853   CREATININE 0.7 09/26/2012 1031   CREATININE 0.70 06/06/2012 0853   CREATININE 0.85 04/24/2009 1114      Component Value Date/Time   CALCIUM 9.2 09/26/2012 1031   CALCIUM 9.3 06/06/2012 0853   ALKPHOS 44 09/26/2012 1031   ALKPHOS 35* 06/06/2012 0853   AST 26 09/26/2012 1031   AST 14 06/06/2012 0853   ALT 22 09/26/2012 1031   ALT 9 06/06/2012 0853   BILITOT 0.36 09/26/2012 1031   BILITOT 0.8 06/06/2012 0853     ADDITIONAL INFORMATION: PROGNOSTIC INDICATORS -  ACIS Results: IMMUNOHISTOCHEMICAL AND MORPHOMETRIC ANALYSIS BY THE AUTOMATED CELLULAR IMAGING SYSTEM (ACIS) Estrogen Receptor: 0%, NEGATIVE Progesterone Receptor: 0%, NEGATIVE Proliferation Marker Ki67: 95% COMMENT: The negative hormone receptor study(ies) in this case have an internal positive control. REFERENCE RANGE ESTROGEN RECEPTOR NEGATIVE <1% POSITIVE =>1% PROGESTERONE RECEPTOR NEGATIVE <1% POSITIVE =>1% All controls stained appropriately Pecola Leisure MD Pathologist, Electronic Signature ( Signed 06/28/2012) CHROMOGENIC IN-SITU HYBRIDIZATION Results: HER-2/NEU BY CISH - NO AMPLIFICATION OF HER-2 DETECTED. RESULT RATIO OF HER2: CEP 17 SIGNALS 1.26 1 of 3 Duplicate copy FINAL for APRIL, COLTER [phone]) ADDITIONAL INFORMATION:(continued) AVERAGE HER2 COPY NUMBER PER CELL 2.45 REFERENCE RANGE NEGATIVE HER2/Chr17 Ratio <2.0 and Average HER2 copy number <4.0 EQUIVOCAL HER2/Chr17 Ratio <2.0 and Average HER2 copy number 4.0 and <6.0 POSITIVE HER2/Chr17 Ratio >=2.0 and/or Average HER2 copy number >=6.0 Jimmy Picket MD Pathologist, Electronic Signature ( Signed 06/26/2012) FINAL DIAGNOSIS Diagnosis Breast, right, needle core biopsy, 3:00 position - INVASIVE DUCTAL CARCINOMA SEE COMMENT. - DUCTAL CARCINOMA IN SITU Microscopic Comment Although the grade of tumor is best assessed at resection, with these biopsies, both the invasive and in situ carcinoma are grade III. Breast prognostic studies are pending and will be reported in an addendum. The case is reviewed with Dr. Raynald Blend who concurs. (CRR:gt, 06/21/12) [place] RUND DO Pathologist, Electronic FINAL DIAGNOSIS Diagnosis Breast, right, needle core biopsy, mass - BENIGN BREAST TISSUE WITH FOCAL PSEUDOANGIOMATOUS STROMAL HYPERPLASIA (PASH). NO EVIDENCE OF MALIGNANCY. Microscopic Comment Called to the [facility] on 07/13/12. (JDP:caf 07/13/12)  RADIOGRAPHIC STUDIES:  Dg Chest 2 View  07/04/2012    *RADIOLOGY REPORT*  Clinical Data: Insertion of Port-A-Cath.  Breast cancer. Hypertension.  CHEST - 2 VIEW  Comparison: CT of the chest on 07/03/2012  Findings: Cardiomediastinal silhouette is within normal limits. The lungs are free of focal consolidations and pleural effusions. Bony structures have a normal appearance.  Contrast is identified within the colon following CT of the abdomen.  IMPRESSION: Negative exam.   Original Report Authenticated By: Norva Pavlov, M.D.   Ct Chest W Contrast  07/03/2012   *RADIOLOGY REPORT*  Clinical Data:  Breast cancer.  No therapy yet.  Lower inner quadrant of right breast.  CT CHEST, ABDOMEN AND PELVIS WITH CONTRAST  Technique: Contiguous axial images of the chest abdomen and pelvis  were obtained after IV contrast administration.  Contrast: 80 ml Omnipaque-300  Comparison: Breast MR of 06/26/2012.  No prior CTs.  CT CHEST  Findings: Lung windows demonstrate minimal left lower lobe subpleural nodularity at 3 mm on image 40/series 4.  Right lung clear.  Soft tissue windows demonstrate small bilateral axillary nodes, without adenopathy.  No subpectoral adenopathy. No supraclavicular adenopathy.  Normal heart size, without pericardial effusion.  No central pulmonary embolism, on this non-dedicated study.  No mediastinal or hilar adenopathy.  No internal mammary adenopathy. Minimal residual thymic tissue in the anterior mediastinum (image 17/series 2).  IMPRESSION:  1. No acute process or evidence of metastatic disease in the chest. 2.  Probable subpleural lymph node in the left lower lobe. Recommend attention on follow-up.  CT ABDOMEN AND PELVIS  Findings:  Mild early phase of hepatic enhancement which decreases sensitivity for focal liver lesions.  None identified.  Normal spleen, stomach, pancreas, gallbladder, biliary tract, adrenal glands, kidneys. No retroperitoneal or retrocrural adenopathy.  Normal colon, appendix, and terminal ileum.  Normal small bowel without  abdominal ascites.  No pelvic adenopathy.    Normal urinary bladder and uterus.  No adnexal mass.  Trace free pelvic fluid is likely physiologic.  Transitional right-sided lumbosacral anatomy.  Degenerative disc disease at the lumbosacral junction.  IMPRESSION:  No acute process or evidence of metastatic disease in the abdomen or pelvis.   Original Report Authenticated By: Jeronimo Greaves, M.D.   US Breast Right  06/29/2012   *RADIOLOGY REPORT*  Clinical Data:  52 year old female with newly diagnosed right breast cancer.  Suspicious abnormal enhancement anterior and superior to the biopsy-proven neoplasm - for second look ultrasound evaluation.  RIGHT BREAST ULTRASOUND  Comparison:  Prior mammograms, ultrasounds and MRI.  Findings: Ultrasound is performed, showing the biopsy-proven neoplasm at the 3 o'clock position of the right breast 4 cm from the nipple. No other discrete sonographic abnormalities are identified, specifically medial, superior, and anterior to the biopsy-proven neoplasm.  IMPRESSION: Patchy abnormal enhancement medial superior to the biopsy-proven neoplasm identified on recent MRI is not visualized sonographically as a discrete abnormality.  MR guided biopsy is recommended if breast conservation surgery is considered.  BI-RADS CATEGORY 6:  Known biopsy-proven malignancy - appropriate action should be taken.  RECOMMENDATION: MR guided right breast biopsy, which will be scheduled by our office.  I have discussed the findings and recommendations with the patient. Results were also provided in writing at the conclusion of the visit.  If applicable, a reminder letter will be sent to the patient regarding the next appointment.   Original Report Authenticated By: Harmon Pier, M.D.   US Breast Right  06/20/2012   *RADIOLOGY REPORT*  Clinical Data:  52 year old female for annual bilateral mammograms and palpable mass in the upper and inner right breast discovered on clinical examination.  DIGITAL  DIAGNOSTIC BILATERAL MAMMOGRAM WITH CAD AND RIGHT BREAST ULTRASOUND:  Comparison:  03/21/2009 and prior mammograms dating back to 01/21/2005  Findings:  ACR Breast Density Category 4: The breast tissue is extremely dense.  Routine and spot compression views of the right breast demonstrate no evidence of suspicious mass or distortion. Scattered punctate calcifications within the breasts bilaterally are again identified.  Mammographic images were processed with CAD.  On physical exam, thickening is identified at the 12 o'clock position of the right breast 4 cm from the nipple. A firm palpable mass identified at the 3 o'clock position of the right breast 4 cm from the nipple.  Ultrasound  is performed, showing no evidence of solid or cystic mass, distortion or abnormal areas of shadowing at the 12 o'clock position of the right breast. A 7 x 6 x 17 mm slightly irregular oval hypoechoic mass with calcifications is horizontally oriented at the 3 o'clock position of the right breast 4 cm from the nipple.  IMPRESSION: Indeterminate 7 x 6 x 17 mm hypoechoic mass at the 3 o'clock position of the right breast.  Tissue sampling is recommended.  No mammographic evidence of breast malignancy bilaterally.  No mammographic, palpable or sonographic abnormality at the 12 o'clock position of the right breast, in the area of palpable concern.  BI-RADS CATEGORY 4:  Suspicious abnormality - biopsy should be considered.  RECOMMENDATION:  Ultrasound guided right breast biopsy, which will be performed today but dictated in a separate report.  I have discussed the findings and recommendations with the patient. Results were also provided in writing at the conclusion of the visit.  If applicable, a reminder letter will be sent to the patient regarding the next appointment.   Original Report Authenticated By: Harmon Pier, M.D.   Ct Abdomen Pelvis W Contrast  07/03/2012   *RADIOLOGY REPORT*  Clinical Data:  Breast cancer.  No therapy yet.  Lower  inner quadrant of right breast.  CT CHEST, ABDOMEN AND PELVIS WITH CONTRAST  Technique: Contiguous axial images of the chest abdomen and pelvis were obtained after IV contrast administration.  Contrast: 80 ml Omnipaque-300  Comparison: Breast MR of 06/26/2012.  No prior CTs.  CT CHEST  Findings: Lung windows demonstrate minimal left lower lobe subpleural nodularity at 3 mm on image 40/series 4.  Right lung clear.  Soft tissue windows demonstrate small bilateral axillary nodes, without adenopathy.  No subpectoral adenopathy. No supraclavicular adenopathy.  Normal heart size, without pericardial effusion.  No central pulmonary embolism, on this non-dedicated study.  No mediastinal or hilar adenopathy.  No internal mammary adenopathy. Minimal residual thymic tissue in the anterior mediastinum (image 17/series 2).  IMPRESSION:  1. No acute process or evidence of metastatic disease in the chest. 2.  Probable subpleural lymph node in the left lower lobe. Recommend attention on follow-up.  CT ABDOMEN AND PELVIS  Findings:  Mild early phase of hepatic enhancement which decreases sensitivity for focal liver lesions.  None identified.  Normal spleen, stomach, pancreas, gallbladder, biliary tract, adrenal glands, kidneys. No retroperitoneal or retrocrural adenopathy.  Normal colon, appendix, and terminal ileum.  Normal small bowel without abdominal ascites.  No pelvic adenopathy.    Normal urinary bladder and uterus.  No adnexal mass.  Trace free pelvic fluid is likely physiologic.  Transitional right-sided lumbosacral anatomy.  Degenerative disc disease at the lumbosacral junction.  IMPRESSION:  No acute process or evidence of metastatic disease in the abdomen or pelvis.   Original Report Authenticated By: Jeronimo Greaves, M.D.   Mr Breast Right W Wo Contrast  07/12/2012   *RADIOLOGY REPORT*  Clinical Data:  Known invasive mammary carcinoma in the right breast at 3 o'clock posteriorly.  Additional site of abnormal enhancement  in the right upper inner quadrant closer to the nipple for possible biopsy.  ATTEMPTED MRI GUIDED VACUUM ASSISTED BIOPSY OF THE RIGHT BREAST WITHOUT AND WITH CONTRAST  Comparison: Previous exams.  Technique: Multiplanar, multisequence MR images of the right breast were obtained prior to and following the intravenous administration of 9 ml of Mulithance.  I met with the patient, and we discussed the procedure of MRI guided biopsy, including risks, benefits, and  alternatives. Specifically, we discussed the risks of infection, bleeding, tissue injury, clip migration, and inadequate sampling.  Informed, written consent was given.  Using sterile technique, 2% Lidocaine, MRI guidance, and a 9 gauge vacuum assisted device, biopsy was attempted using a mediolateral approach.  However, the procedure was unsuccessful due to the extremely thin breast compression.  IMPRESSION:  Unsuccessful MRI guided vacuum-assisted biopsy of the right breast.  Patient was sent to the [facility] for further evaluation with ultrasound.   Original Report Authenticated By: Cain Saupe, M.D.   Mr Breast Bilateral W Wo Contrast  06/26/2012   *RADIOLOGY REPORT*  Clinical Data: History of new diagnosis of breast cancer from recent ultrasound guided core needle biopsy right breast 06/20/2012 demonstrating invasive ductal carcinoma with DCIS.  GFR 89, creatinine 0.7 on 06/06/2012.  BILATERAL BREAST MRI WITH AND WITHOUT CONTRAST  Technique: Multiplanar, multisequence MR images of both breasts were obtained prior to and following the intravenous administration of 10ml of multihance.  Three dimensional images were evaluated at the independent DynaCad workstation.  Comparison:  Recent mammograms and ultrasound.  Findings: Examination demonstrates mild background parenchymal enhancement.  Right breast:  There is evidence of an ovoid gently lobulated and somewhat ill-defined heterogeneously enhancing mass over the deep third of the inner midportion  of the right breast representing patient's biopsy-proven malignancy.  This mass contains central clip artifact and measures approximately 0.9 x 1.9 x 1.0 cm. The anterior border of this mass is located 5.5 cm from the nipple. There is a 6 mm slightly ill-defined enhancing ovoid mass 6 mm posterior lateral to the biopsy-proven malignancy lying just superficial to the pectoralis muscle.  There is also clumped non mass enhancement anterior and slightly superior to the biopsy- proven malignancy in the middle third at approximately the 2 o'clock position.  This measures approximately 0.8 x 1.6 x 1.2 cm and is located 3 cm from the nipple.  This is suspicious for multifocal disease.  No suspicious axillary or internal mammary lymph nodes.  Left breast:  No suspicious mass, enhancement or adenopathy.  IMPRESSION: Known biopsy-proven malignancy over the deep third of the inner mid right breast.  6 mm ill-defined enhancing ovoid mass 6 mm posterior lateral to the biopsy-proven malignancy and lying just superficial to the pectoralis muscle.  Clumped non mass enhancement anterior and superior to patient's known malignancy at approximately the 2 o'clock position measuring 0.8 x 1.6 x 2.2 cm.  Findings likely represent multi focal disease.  RECOMMENDATION: Recommend second look ultrasound and core biopsy if feasible of the clumped non mass enhancement at approximately the 2 o'clock position right breast anterior/superior to the known malignancy. If not seen by ultrasound, recommend proceeding to MR guided biopsy of this suspicious abnormality.  THREE-DIMENSIONAL MR IMAGE RENDERING ON INDEPENDENT WORKSTATION:  Three-dimensional MR images were rendered by post-processing of the original MR data on an independent workstation.  The three- dimensional MR images were interpreted, and findings were reported in the accompanying complete MRI report for this study.  BI-RADS CATEGORY 0:  Incomplete.  Need additional imaging evaluation  and/or prior mammograms for comparison.   Original Report Authenticated By: Elberta Fortis, M.D.   Dg Chest Port 1 View  07/06/2012   *RADIOLOGY REPORT*  Clinical Data: Post left-sided port catheter insertion  PORTABLE CHEST - 1 VIEW  Comparison: 07/04/2012  Findings:  Grossly unchanged cardiac silhouette and mediastinal contours. Interval placement of a left anterior chest wall subclavian vein approach port-a-catheter with tip projecting over the superior cavoatrial  junction. Grossly unchanged symmetric biapical pleural parenchymal thickening.  No pneumothorax.  No focal airspace opacities.  No pleural effusion.  No evidence of edema.  Unchanged bones.  IMPRESSION: Interval placement of a left anterior chest wall subclavian vein approach port-a-catheter with tip overlying the superior cavoatrial junction.  No pneumothorax.   Original Report Authenticated By: Tacey Ruiz, MD   Mm Digital Diagnostic Bilat  06/20/2012   *RADIOLOGY REPORT*  Clinical Data:  52 year old female for annual bilateral mammograms and palpable mass in the upper and inner right breast discovered on clinical examination.  DIGITAL DIAGNOSTIC BILATERAL MAMMOGRAM WITH CAD AND RIGHT BREAST ULTRASOUND:  Comparison:  03/21/2009 and prior mammograms dating back to 01/21/2005  Findings:  ACR Breast Density Category 4: The breast tissue is extremely dense.  Routine and spot compression views of the right breast demonstrate no evidence of suspicious mass or distortion. Scattered punctate calcifications within the breasts bilaterally are again identified.  Mammographic images were processed with CAD.  On physical exam, thickening is identified at the 12 o'clock position of the right breast 4 cm from the nipple. A firm palpable mass identified at the 3 o'clock position of the right breast 4 cm from the nipple.  Ultrasound is performed, showing no evidence of solid or cystic mass, distortion or abnormal areas of shadowing at the 12 o'clock position of  the right breast. A 7 x 6 x 17 mm slightly irregular oval hypoechoic mass with calcifications is horizontally oriented at the 3 o'clock position of the right breast 4 cm from the nipple.  IMPRESSION: Indeterminate 7 x 6 x 17 mm hypoechoic mass at the 3 o'clock position of the right breast.  Tissue sampling is recommended.  No mammographic evidence of breast malignancy bilaterally.  No mammographic, palpable or sonographic abnormality at the 12 o'clock position of the right breast, in the area of palpable concern.  BI-RADS CATEGORY 4:  Suspicious abnormality - biopsy should be considered.  RECOMMENDATION:  Ultrasound guided right breast biopsy, which will be performed today but dictated in a separate report.  I have discussed the findings and recommendations with the patient. Results were also provided in writing at the conclusion of the visit.  If applicable, a reminder letter will be sent to the patient regarding the next appointment.   Original Report Authenticated By: Harmon Pier, M.D.   Mm Digital Diagnostic Unilat R  07/12/2012   *RADIOLOGY REPORT*  Clinical Data:  Ultrasound-guided core needle biopsy of a hypoechoic area at 2 o'clock 2 cm from the right nipple with clip placement.  Known invasive mammary carcinoma in the right breast. Unsuccessful right breast MRI guided biopsy earlier today.  DIGITAL DIAGNOSTIC RIGHT MAMMOGRAM  Comparison:  Previous exams.  Findings:  Films are performed following ultrasound guided biopsy of a small hypoechoic area at 2 o'clock 2 cm from the right nipple. The ribbon clip is positioned within the area of concern. The ribbon clip is approximately 2.6 cm anterior to the top hat clip from the original biopsy on 06/20/2012.  IMPRESSION: Appropriate clip placement following ultrasound-guided core needle biopsy of a hypoechoic area at 2 o'clock 2 cm in the right nipple.   Original Report Authenticated By: Cain Saupe, M.D.   Mm Digital Diagnostic Unilat R  06/20/2012    *RADIOLOGY REPORT*  Clinical Data:  52 year old female - evaluate clip placement following ultrasound guided right breast biopsy.  DIGITAL DIAGNOSTIC RIGHT MAMMOGRAM  Comparison:  Previous exams.  Findings:  Films are performed following ultrasound guided  biopsy of the 7 x 6 x 17 mm hypoechoic mass at the 3 o'clock position of the right breast 4 cm from the nipple. The T shaped biopsy clip is in satisfactory position.  IMPRESSION: Satisfactory clip placement following ultrasound guided right breast biopsy.  Pathology will be followed.   Original Report Authenticated By: Harmon Pier, M.D.   Dg Fluoro Guide Cv Line-no Report  07/06/2012   CLINICAL DATA: port placement   FLOURO GUIDE CV LINE  Fluoroscopy was utilized by the requesting physician.  No radiographic  interpretation.    [place] Rt Breast Bx W Loc Dev 1st Lesion Img Bx Spec US Guide  07/13/2012   **ADDENDUM** CREATED: 07/13/2012 14:11:45  I spoke with the patient by telephone on 07/13/2012 to discuss pathology results.  Pathology demonstrates  pseudoangiomatous stromal hyperplasia.  The findings were called to the office of Dr. Donell Beers. Although the imaging findings could be related to pseudoangiomatous stromal hyperplasia, excision of the anterior biopsy site is suggested at the time of patient's definitive surgery.  The patient is considering unilateral or bilateral mastectomy at this time.  The patient reports no problems at the biopsy site.  All questions were answered.  Recommendations:  Excision of the biopsy site. The patient is scheduled to begin chemotherapy in 4 days.  **END ADDENDUM** SIGNED BY: Blair Hailey. Manson Passey, M.D.  07/12/2012   *RADIOLOGY REPORT*  Clinical Data:  Unsuccessful MRI guided biopsy of the area of abnormal enhancement in the left anterior upper inner quadrant, anterior to the known invasive mammary carcinoma.  Second look ultrasound performed and a 5 x 4 x 4 mm hypoechoic area found at 2 o'clock 2 cm from the right nipple which  will be biopsied.  ULTRASOUND GUIDED VACUUM ASSISTED CORE BIOPSY OF THE RIGHT BREAST  Sonography of the right upper inner quadrant was performed.  There is an ill-defined hypoechoic 5 x 4 x 4 mm area at 2 o'clock 2 cm from the right nipple which will be biopsied with ultrasound guidance.  The patient and I discussed the procedure of ultrasound-guided biopsy, including benefits and alternatives.  We discussed the high likelihood of a successful procedure. We discussed the risks of the procedure including infection, bleeding, tissue injury, clip migration, and inadequate sampling.  Written informed consent was given.  Using sterile technique, 2% lidocaine, ultrasound guidance, and a 12 gauge vacuum assisted needle, biopsy was performed of the hypoechoic area at 2 o'clock 2 cm from the right nipple using a caudocranial approach.  At the conclusion of the procedure, a within tissue marker clip was deployed into the biopsy cavity. Follow-up 2-view mammogram was performed and dictated separately.  IMPRESSION: Ultrasound-guided biopsy of a 5 x 4 x 4 mm hypoechoic area at 2 o'clock 2 cm from the right nipple.  No apparent complications.   Original Report Authenticated By: Cain Saupe, M.D.   [place] Rt Breast Bx W Loc Dev 1st Lesion Img Bx Spec US Guide  06/21/2012   *RADIOLOGY REPORT*  Clinical Data:  Indeterminate 7 x 6 x 17 mm hypoechoic mass in the inner right breast - for tissue sampling.  ULTRASOUND GUIDED VACUUM ASSISTED CORE BIOPSY OF THE RIGHT BREAST  Comparison: Previous exams.  I met with the patient and we discussed the procedure of ultrasound- guided biopsy, including benefits and alternatives.  We discussed the high likelihood of a successful procedure. We discussed the risks of the procedure including infection, bleeding, tissue injury, clip migration, and inadequate sampling.  Informed written consent was  given.  Using sterile technique, 2% lidocaine ultrasound guidance and a 12 gauge vacuum assisted  needle biopsy was performed of the 7 x 6 x 17 mm hypoechoic mass at the 3 o'clock position of the right breast 4 cm from the nipple using a lateral approach.  At the conclusion of the procedure, a T shaped tissue marker clip was deployed into the biopsy cavity.  Follow-up 2-view mammogram was performed and dictated separately.  IMPRESSION: Ultrasound-guided biopsy of right breast mass.  No apparent complications.  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 Histology correlates with imaging findings.  The patient was contacted by phone on 06/21/2012 and these results given to her which she understood. Her questions were answered. She was encouraged to obtain breast cancer educational material from [facility] at her convenience. The patient had no complaints with her biopsy site.  Recommend surgery/oncology consultation.  An appointment at the [facility] has been scheduled for 06/28/2012. Recommend bilateral breast MRI, which has been scheduled for 06/26/2012. The patient was informed of these appointments.   Original Report Authenticated By: Harmon Pier, M.D.    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 Patient had other areas seen on MRI biopsy of the second mass was negative. Patient has had her Port-A-Cath placed. She is proceeding neoadjuvant chemotherapy to possibly conserve her breast. Chemotherapy will consist of Adriamycin Cytoxan given dose dense for 4 cycles starting today 07/17/2012. She will receive Neulasta support on day 2 for each cycle. When she completes this then she will begin weekly Taxol and carboplatinum for a total of 12 weeks.  #2 we discussed risks benefits and side effects of chemotherapy. She has had an echocardiogram as well as chemotherapy teaching class. Port-A-Cath was placed by Dr. Donell Beers. The port is functioning well.  #3 Taxol/Carbo  cycle 3 on 9/3  #4 vaginal herpes outbreak    PLAN:  #1 patient will proceed with cycle 3 of Taxol carbo on 09/27/2012.  #2 for herpes I have recommended she proceed with Valtrex 500 mg twice a day for 3 days and then continue suppressive doses of 500 mg daily.  #3 she'll return in one week's time for followup here  All questions were answered. The patient knows to call the clinic with any problems, questions or concerns. We can certainly see the patient much sooner if necessary.  I spent 25 minutes counseling the patient face to face. The total time spent in the appointment was 30 minutes.   Cherie Ouch Lyn Hollingshead, NP Medical Oncology [facility] Phone: [phone] 09/26/2012, 11:51 AM

## 2012-09-26 NOTE — Telephone Encounter (Signed)
Per staff message and POF I have scheduled appts.  JMW  

## 2012-09-27 ENCOUNTER — Ambulatory Visit (HOSPITAL_BASED_OUTPATIENT_CLINIC_OR_DEPARTMENT_OTHER): Payer: Medicaid Other

## 2012-09-27 VITALS — BP 117/90 | HR 95 | Temp 98.7°F | Resp 20

## 2012-09-27 DIAGNOSIS — C50919 Malignant neoplasm of unspecified site of unspecified female breast: Secondary | ICD-10-CM

## 2012-09-27 DIAGNOSIS — C50319 Malignant neoplasm of lower-inner quadrant of unspecified female breast: Secondary | ICD-10-CM

## 2012-09-27 DIAGNOSIS — Z5111 Encounter for antineoplastic chemotherapy: Secondary | ICD-10-CM

## 2012-09-27 MED ORDER — DEXAMETHASONE SODIUM PHOSPHATE 20 MG/5ML IJ SOLN
20.0000 mg | Freq: Once | INTRAMUSCULAR | Status: AC
  Administered 2012-09-27: 20 mg via INTRAVENOUS

## 2012-09-27 MED ORDER — ONDANSETRON 16 MG/50ML IVPB (CHCC)
16.0000 mg | Freq: Once | INTRAVENOUS | Status: AC
  Administered 2012-09-27: 16 mg via INTRAVENOUS

## 2012-09-27 MED ORDER — PACLITAXEL CHEMO INJECTION 300 MG/50ML
80.0000 mg/m2 | Freq: Once | INTRAVENOUS | Status: AC
  Administered 2012-09-27: 126 mg via INTRAVENOUS
  Filled 2012-09-27: qty 21

## 2012-09-27 MED ORDER — SODIUM CHLORIDE 0.9 % IV SOLN
Freq: Once | INTRAVENOUS | Status: AC
  Administered 2012-09-27: 09:00:00 via INTRAVENOUS

## 2012-09-27 MED ORDER — HEPARIN SOD (PORK) LOCK FLUSH 100 UNIT/ML IV SOLN
500.0000 [IU] | Freq: Once | INTRAVENOUS | Status: AC | PRN
  Administered 2012-09-27: 500 [IU]
  Filled 2012-09-27: qty 5

## 2012-09-27 MED ORDER — SODIUM CHLORIDE 0.9 % IJ SOLN
10.0000 mL | INTRAMUSCULAR | Status: DC | PRN
  Administered 2012-09-27: 10 mL
  Filled 2012-09-27: qty 10

## 2012-09-27 MED ORDER — FAMOTIDINE IN NACL 20-0.9 MG/50ML-% IV SOLN
20.0000 mg | Freq: Once | INTRAVENOUS | Status: AC
  Administered 2012-09-27: 20 mg via INTRAVENOUS

## 2012-09-27 MED ORDER — CARBOPLATIN CHEMO INJECTION 450 MG/45ML
217.0000 mg | Freq: Once | INTRAVENOUS | Status: AC
  Administered 2012-09-27: 220 mg via INTRAVENOUS
  Filled 2012-09-27: qty 22

## 2012-09-27 MED ORDER — DIPHENHYDRAMINE HCL 50 MG/ML IJ SOLN
25.0000 mg | Freq: Once | INTRAMUSCULAR | Status: AC
  Administered 2012-09-27: 25 mg via INTRAVENOUS

## 2012-09-27 NOTE — Patient Instructions (Addendum)
[facility] Discharge Instructions for Patients Receiving Chemotherapy  Today you received the following chemotherapy agents: Taxol and Carboplatin.  To help prevent nausea and vomiting after your treatment, we encourage you to take your nausea medication as prescribed.   If you develop nausea and vomiting that is not controlled by your nausea medication, call the clinic.   BELOW ARE SYMPTOMS THAT SHOULD BE REPORTED IMMEDIATELY:  *FEVER GREATER THAN 100.5 F  *CHILLS WITH OR WITHOUT FEVER  NAUSEA AND VOMITING THAT IS NOT CONTROLLED WITH YOUR NAUSEA MEDICATION  *UNUSUAL SHORTNESS OF BREATH  *UNUSUAL BRUISING OR BLEEDING  TENDERNESS IN MOUTH AND THROAT WITH OR WITHOUT PRESENCE OF ULCERS  *URINARY PROBLEMS  *BOWEL PROBLEMS  UNUSUAL RASH Items with * indicate a potential emergency and should be followed up as soon as possible.  Feel free to call the clinic you have any questions or concerns. The clinic phone number is [phone].    

## 2012-10-02 ENCOUNTER — Telehealth: Payer: Self-pay | Admitting: *Deleted

## 2012-10-02 ENCOUNTER — Other Ambulatory Visit (HOSPITAL_BASED_OUTPATIENT_CLINIC_OR_DEPARTMENT_OTHER): Payer: Medicaid Other | Admitting: Lab

## 2012-10-02 ENCOUNTER — Ambulatory Visit (HOSPITAL_BASED_OUTPATIENT_CLINIC_OR_DEPARTMENT_OTHER): Payer: Medicaid Other | Admitting: Oncology

## 2012-10-02 ENCOUNTER — Other Ambulatory Visit: Payer: Self-pay | Admitting: *Deleted

## 2012-10-02 ENCOUNTER — Telehealth: Payer: Self-pay | Admitting: Oncology

## 2012-10-02 ENCOUNTER — Ambulatory Visit (HOSPITAL_BASED_OUTPATIENT_CLINIC_OR_DEPARTMENT_OTHER): Payer: Medicaid Other

## 2012-10-02 ENCOUNTER — Encounter: Payer: Self-pay | Admitting: Oncology

## 2012-10-02 ENCOUNTER — Ambulatory Visit (HOSPITAL_BASED_OUTPATIENT_CLINIC_OR_DEPARTMENT_OTHER): Payer: Medicaid Other | Admitting: Lab

## 2012-10-02 VITALS — BP 113/74 | HR 90 | Temp 98.0°F | Resp 18 | Ht 63.0 in | Wt 122.6 lb

## 2012-10-02 DIAGNOSIS — C50319 Malignant neoplasm of lower-inner quadrant of unspecified female breast: Secondary | ICD-10-CM

## 2012-10-02 DIAGNOSIS — C50919 Malignant neoplasm of unspecified site of unspecified female breast: Secondary | ICD-10-CM

## 2012-10-02 DIAGNOSIS — Z5111 Encounter for antineoplastic chemotherapy: Secondary | ICD-10-CM

## 2012-10-02 DIAGNOSIS — B009 Herpesviral infection, unspecified: Secondary | ICD-10-CM

## 2012-10-02 DIAGNOSIS — C50311 Malignant neoplasm of lower-inner quadrant of right female breast: Secondary | ICD-10-CM

## 2012-10-02 DIAGNOSIS — G589 Mononeuropathy, unspecified: Secondary | ICD-10-CM

## 2012-10-02 LAB — COMPREHENSIVE METABOLIC PANEL (CC13)
ALT: 17 U/L (ref 0–55)
BUN: 16.6 mg/dL (ref 7.0–26.0)
CO2: 28 mEq/L (ref 22–29)
Calcium: 8.7 mg/dL (ref 8.4–10.4)
Creatinine: 0.7 mg/dL (ref 0.6–1.1)
Total Bilirubin: 0.4 mg/dL (ref 0.20–1.20)

## 2012-10-02 LAB — URINALYSIS, MICROSCOPIC - CHCC
Blood: NEGATIVE
Glucose: NEGATIVE mg/dL
Nitrite: NEGATIVE
Protein: <30 mg/dL
Specific Gravity, Urine: 1.02 (ref 1.003–1.035)
Urobilinogen, UR: 1 mg/dL (ref 0.2–1)

## 2012-10-02 LAB — CBC WITH DIFFERENTIAL/PLATELET
Eosinophils Absolute: 0 10*3/uL (ref 0.0–0.5)
HCT: 29.2 % — ABNORMAL LOW (ref 34.8–46.6)
LYMPH%: 10.5 % — ABNORMAL LOW (ref 14.0–49.7)
MCHC: 33.6 g/dL (ref 31.5–36.0)
MCV: 91.8 fL (ref 79.5–101.0)
MONO%: 5.8 % (ref 0.0–14.0)
NEUT%: 82.8 % — ABNORMAL HIGH (ref 38.4–76.8)
Platelets: 200 10*3/uL (ref 145–400)
RBC: 3.18 10*6/uL — ABNORMAL LOW (ref 3.70–5.45)
nRBC: 0 % (ref 0–0)

## 2012-10-02 MED ORDER — SODIUM CHLORIDE 0.9 % IJ SOLN
10.0000 mL | INTRAMUSCULAR | Status: DC | PRN
  Administered 2012-10-02: 10 mL
  Filled 2012-10-02: qty 10

## 2012-10-02 MED ORDER — DIPHENHYDRAMINE HCL 50 MG/ML IJ SOLN
25.0000 mg | Freq: Once | INTRAMUSCULAR | Status: AC
  Administered 2012-10-02: 25 mg via INTRAVENOUS

## 2012-10-02 MED ORDER — PACLITAXEL CHEMO INJECTION 300 MG/50ML
80.0000 mg/m2 | Freq: Once | INTRAVENOUS | Status: AC
  Administered 2012-10-02: 126 mg via INTRAVENOUS
  Filled 2012-10-02: qty 21

## 2012-10-02 MED ORDER — DIPHENHYDRAMINE HCL 50 MG/ML IJ SOLN
INTRAMUSCULAR | Status: AC
  Filled 2012-10-02: qty 1

## 2012-10-02 MED ORDER — DEXAMETHASONE SODIUM PHOSPHATE 20 MG/5ML IJ SOLN
20.0000 mg | Freq: Once | INTRAMUSCULAR | Status: AC
  Administered 2012-10-02: 20 mg via INTRAVENOUS

## 2012-10-02 MED ORDER — HEPARIN SOD (PORK) LOCK FLUSH 100 UNIT/ML IV SOLN
500.0000 [IU] | Freq: Once | INTRAVENOUS | Status: AC | PRN
  Administered 2012-10-02: 500 [IU]
  Filled 2012-10-02: qty 5

## 2012-10-02 MED ORDER — ONDANSETRON 16 MG/50ML IVPB (CHCC)
INTRAVENOUS | Status: AC
  Filled 2012-10-02: qty 16

## 2012-10-02 MED ORDER — GABAPENTIN 100 MG PO CAPS: ORAL_CAPSULE | ORAL | Status: DC

## 2012-10-02 MED ORDER — ONDANSETRON 16 MG/50ML IVPB (CHCC)
16.0000 mg | Freq: Once | INTRAVENOUS | Status: AC
Start: 2012-10-02 — End: 2012-10-02
  Administered 2012-10-02: 16 mg via INTRAVENOUS

## 2012-10-02 MED ORDER — FAMOTIDINE IN NACL 20-0.9 MG/50ML-% IV SOLN
20.0000 mg | Freq: Once | INTRAVENOUS | Status: AC
  Administered 2012-10-02: 20 mg via INTRAVENOUS

## 2012-10-02 MED ORDER — DEXAMETHASONE SODIUM PHOSPHATE 20 MG/5ML IJ SOLN
INTRAMUSCULAR | Status: AC
  Filled 2012-10-02: qty 5

## 2012-10-02 MED ORDER — SODIUM CHLORIDE 0.9 % IV SOLN
Freq: Once | INTRAVENOUS | Status: AC
  Administered 2012-10-02: 15:00:00 via INTRAVENOUS

## 2012-10-02 MED ORDER — FAMOTIDINE IN NACL 20-0.9 MG/50ML-% IV SOLN
INTRAVENOUS | Status: AC
  Filled 2012-10-02: qty 50

## 2012-10-02 MED ORDER — SODIUM CHLORIDE 0.9 % IV SOLN
217.0000 mg | Freq: Once | INTRAVENOUS | Status: DC
  Filled 2012-10-02: qty 22

## 2012-10-02 NOTE — Progress Notes (Signed)
OFFICE PROGRESS NOTE  CC  Jaime Edwards, MD [address] [place] [address] Dr. Everardo Beals Dr. 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 Patient had a palpable mass at the 3:00 position. She was seen at the breast Center on 06/20/2012 she had a mammogram and ultrasound showing a 1.7 cm mass at the 3:00 position. 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 Tumor was grade 3.  #2 MRIs performed on 06/26/2012 showed no biopsy mass at 3:00 but also 2 other adjacent masses at 1 at 2:00 measuring 0.8 x 1.6 x 2.2 cm andalso 6 mm ill-defined mass posterior to the biopsy-proven malignancy.  #3 patient has gone on to have a Port-A-Cath placed to begin neoadjuvant chemotherapy initially consisting of Adriamycin Cytoxan to be given dose dense for 4 cycles followed by weekly Taxol and carboplatinum for a total of 12 weeks.carefully agent made to have an excellent response to neoadjuvant chemotherapy and could possibly have breast preservation of course she may even need chest mastectomy with sentinel lymph node biopsies. Patient and I had met at the [facility] clinic we discussed the rationale for upfront chemotherapy she is now here to receive that.   #4 patient is now status post Adriamycin Cytoxan given dose dense for 4 cycles administered from 07/17/2012 through 09/04/2012 neoadjuvant bleeding.  #5 she was then begun on weekly Taxol carboplatinum beginning 09/11/2012  CURRENT THERAPY: Taxol Carbo cycle 4  INTERVAL HISTORY: Jaime Murillo 52 y.o. female returns for evaluation prior to Taxol/Carbo cycle 4.  She is doing well. She is now beginning to feel what sounds like  grade 1 neuropathy with pins and needles. She also noted some pressure during urination. She thinks she may have a urinary tract infection. We will check a urine for this. She denies any nausea vomiting fevers chills night sweats headaches no shortness of breath chest pains palpitations no myalgias and arthralgias. Remainder of the 10 point review of systems is negative.  MEDICAL HISTORY: Past Medical History  Diagnosis Date  . HSV infection   . NSVD (normal spontaneous vaginal delivery)   . Hypertension     takes HCTZ  . Urinary frequency   . Anemia     takes Ferrous Sulfate occasionally  . Breast cancer 2014    triple negative    ALLERGIES:  has No Known Allergies.  MEDICATIONS:  Current Outpatient Prescriptions  Medication Sig Dispense Refill  . Alum & Mag Hydroxide-Simeth (MAGIC MOUTHWASH W/LIDOCAINE) SOLN Take 5 mL by mouth 4 (four) times daily. Swish and spit  500 mL  0  . ferrous sulfate 325 (65 FE) MG tablet Take 325 mg by mouth daily with breakfast.      . hydrochlorothiazide (HYDRODIURIL) 25 MG tablet Take 1 tablet (25 mg total) by mouth daily.  30 tablet  11  . lidocaine-prilocaine (EMLA) cream Apply 1 application topically as needed (apply to PAC site 1-2 hours prior to treatment.).      Marland Kitchen valACYclovir (VALTREX) 500 MG tablet Take 500 mg by mouth daily as needed. For herpes      . ciprofloxacin (CIPRO) 500 MG tablet Take 1 tablet (500 mg total) by mouth 2 (two) times daily.  14 tablet  5  .  dexamethasone (DECADRON) 4 MG tablet Take 4 mg by mouth 2 (two) times daily with a meal. Take 2 tabs every day on the day after chemo an then take 2 tabs twice a day with food for 2 days.      Marland Kitchen gabapentin (NEURONTIN) 100 MG capsule Take 1 capsule twice a day  60 capsule  5  . LORazepam (ATIVAN) 0.5 MG tablet Take 0.5 mg by mouth every 6 (six) hours as needed for anxiety (as needed for nausea and vomitting).      . ondansetron (ZOFRAN) 8 MG tablet Take 8 mg by mouth every 8 (eight) hours  as needed for nausea (Take 1 pill by mouth twice a day as needed  for nausea and vomitting starting 3rd day after chemo).      Marland Kitchen oxyCODONE-acetaminophen (ROXICET) 5-325 MG per tablet Take 1-2 tablets by mouth every 4 (four) hours as needed for pain.  30 tablet  0  . prochlorperazine (COMPAZINE) 10 MG tablet Take 10 mg by mouth every 6 (six) hours as needed (for nausea and vomitting).      . prochlorperazine (COMPAZINE) 25 MG suppository Place 25 mg rectally every 12 (twelve) hours as needed for nausea (as needed for nausea and vomitting).      Marland Kitchen UNABLE TO FIND Apply 1 application topically once. 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  1 application  0   No current facility-administered medications for this visit.    SURGICAL HISTORY:  Past Surgical History  Procedure Laterality Date  . Tubal ligation  2003    LTL  . Portacath placement N/A 07/06/2012    Procedure: INSERTION PORT-A-CATH;  Surgeon: Almond Lint, MD;  Location: [facility] OR;  Service: General;  Laterality: N/A;    REVIEW OF SYSTEMS:  General: fatigue (-), night sweats (-), fever (-), pain (-) Lymph: palpable nodes (-) HEENT: vision changes (-), mucositis (-), gum bleeding (-), epistaxis (-) Cardiovascular: chest pain (-), palpitations (-) Pulmonary: shortness of breath (-), dyspnea on exertion (-), cough (-), hemoptysis (-) GI:  Early satiety (-), melena (-), dysphagia (-), nausea/vomiting (-), diarrhea (-) GU: dysuria (-), hematuria (-), incontinence (-) Musculoskeletal: joint swelling (-), joint pain (-), back pain (-) Neuro: weakness (-), numbness (-), headache (-), confusion (-) Skin: Rash (-), lesions (-), dryness (-) Psych: depression (-), suicidal/homicidal ideation (-), feeling of hopelessness (-)   PHYSICAL EXAMINATION: Blood pressure 113/74, pulse 90, temperature 98 F (36.7 C), temperature source Oral, resp. rate 18, height 5\' 3"  (1.6 m), weight 122 lb 9.6 oz  (55.611 kg). Body mass index is 21.72 kg/(m^2). General: Patient is a well appearing female in no acute distress HEENT: PERRLA, sclerae anicteric no conjunctival pallor, MMM, small ulceration under tongue on right and left lateral sides.  Neck: supple, no palpable adenopathy Lungs: clear to auscultation bilaterally, no wheezes, rhonchi, or rales Cardiovascular: regular rate rhythm, S1, S2, no murmurs, rubs or gallops Abdomen: Soft, non-tender, non-distended, normoactive bowel sounds, no HSM Extremities: warm and well perfused, no clubbing, cyanosis, or edema Skin: No rashes or lesions Neuro: Non-focal Breast examination: Left breast no masses or nipple discharge Right breast reveals 3 discrete masses at the 3:00 position 2:00 position.  It is nontender there is no nipple discharge. Conglomeration of these masses to my exam measure about 2-3cm. ECOG PERFORMANCE STATUS: 0 - Asymptomatic  LABORATORY DATA: Lab Results  Component Value Date   WBC 5.5 10/02/2012   HGB 9.8* 10/02/2012  HCT 29.2* 10/02/2012   MCV 91.8 10/02/2012   PLT 200 10/02/2012      Chemistry      Component Value Date/Time   NA 137 09/26/2012 1031   NA 137 06/06/2012 0853   K 3.9 09/26/2012 1031   K 3.8 06/06/2012 0853   CL 104 07/17/2012 1357   CL 102 06/06/2012 0853   CO2 26 09/26/2012 1031   CO2 26 06/06/2012 0853   BUN 10.9 09/26/2012 1031   BUN 12 06/06/2012 0853   CREATININE 0.7 09/26/2012 1031   CREATININE 0.70 06/06/2012 0853   CREATININE 0.85 04/24/2009 1114      Component Value Date/Time   CALCIUM 9.2 09/26/2012 1031   CALCIUM 9.3 06/06/2012 0853   ALKPHOS 44 09/26/2012 1031   ALKPHOS 35* 06/06/2012 0853   AST 26 09/26/2012 1031   AST 14 06/06/2012 0853   ALT 22 09/26/2012 1031   ALT 9 06/06/2012 0853   BILITOT 0.36 09/26/2012 1031   BILITOT 0.8 06/06/2012 0853     ADDITIONAL INFORMATION: PROGNOSTIC INDICATORS - ACIS Results: IMMUNOHISTOCHEMICAL AND MORPHOMETRIC ANALYSIS BY THE AUTOMATED CELLULAR IMAGING SYSTEM (ACIS) Estrogen  Receptor: 0%, NEGATIVE Progesterone Receptor: 0%, NEGATIVE Proliferation Marker Ki67: 95% COMMENT: The negative hormone receptor study(ies) in this case have an internal positive control. REFERENCE RANGE ESTROGEN RECEPTOR NEGATIVE <1% POSITIVE =>1% PROGESTERONE RECEPTOR NEGATIVE <1% POSITIVE =>1% All controls stained appropriately Pecola Leisure MD Pathologist, Electronic Signature ( Signed 06/28/2012) CHROMOGENIC IN-SITU HYBRIDIZATION Results: HER-2/NEU BY CISH - NO AMPLIFICATION OF HER-2 DETECTED. RESULT RATIO OF HER2: CEP 17 SIGNALS 1.26 1 of 3 Duplicate copy FINAL for EVALYNNE, LOCURTO [phone]) ADDITIONAL INFORMATION:(continued) AVERAGE HER2 COPY NUMBER PER CELL 2.45 REFERENCE RANGE NEGATIVE HER2/Chr17 Ratio <2.0 and Average HER2 copy number <4.0 EQUIVOCAL HER2/Chr17 Ratio <2.0 and Average HER2 copy number 4.0 and <6.0 POSITIVE HER2/Chr17 Ratio >=2.0 and/or Average HER2 copy number >=6.0 Jimmy Picket MD Pathologist, Electronic Signature ( Signed 06/26/2012) FINAL DIAGNOSIS Diagnosis Breast, right, needle core biopsy, 3:00 position - INVASIVE DUCTAL CARCINOMA SEE COMMENT. - DUCTAL CARCINOMA IN SITU Microscopic Comment Although the grade of tumor is best assessed at resection, with these biopsies, both the invasive and in situ carcinoma are grade III. Breast prognostic studies are pending and will be reported in an addendum. The case is reviewed with Dr. Raynald Blend who concurs. (CRR:gt, 06/21/12) [place] RUND DO Pathologist, Electronic FINAL DIAGNOSIS Diagnosis Breast, right, needle core biopsy, mass - BENIGN BREAST TISSUE WITH FOCAL PSEUDOANGIOMATOUS STROMAL HYPERPLASIA (PASH). NO EVIDENCE OF MALIGNANCY. Microscopic Comment Called to the [facility] on 07/13/12. (JDP:caf 07/13/12)  RADIOGRAPHIC STUDIES:  Dg Chest 2 View  07/04/2012   *RADIOLOGY REPORT*  Clinical Data: Insertion of Port-A-Cath.  Breast cancer. Hypertension.  CHEST - 2 VIEW  Comparison: CT  of the chest on 07/03/2012  Findings: Cardiomediastinal silhouette is within normal limits. The lungs are free of focal consolidations and pleural effusions. Bony structures have a normal appearance.  Contrast is identified within the colon following CT of the abdomen.  IMPRESSION: Negative exam.   Original Report Authenticated By: Norva Pavlov, M.D.   Ct Chest W Contrast  07/03/2012   *RADIOLOGY REPORT*  Clinical Data:  Breast cancer.  No therapy yet.  Lower inner quadrant of right breast.  CT CHEST, ABDOMEN AND PELVIS WITH CONTRAST  Technique: Contiguous axial images of the chest abdomen and pelvis were obtained after IV contrast administration.  Contrast: 80 ml Omnipaque-300  Comparison: Breast MR of 06/26/2012.  No prior CTs.  CT CHEST  Findings: Lung windows demonstrate minimal left lower lobe subpleural nodularity at 3 mm on image 40/series 4.  Right lung clear.  Soft tissue windows demonstrate small bilateral axillary nodes, without adenopathy.  No subpectoral adenopathy. No supraclavicular adenopathy.  Normal heart size, without pericardial effusion.  No central pulmonary embolism, on this non-dedicated study.  No mediastinal or hilar adenopathy.  No internal mammary adenopathy. Minimal residual thymic tissue in the anterior mediastinum (image 17/series 2).  IMPRESSION:  1. No acute process or evidence of metastatic disease in the chest. 2.  Probable subpleural lymph node in the left lower lobe. Recommend attention on follow-up.  CT ABDOMEN AND PELVIS  Findings:  Mild early phase of hepatic enhancement which decreases sensitivity for focal liver lesions.  None identified.  Normal spleen, stomach, pancreas, gallbladder, biliary tract, adrenal glands, kidneys. No retroperitoneal or retrocrural adenopathy.  Normal colon, appendix, and terminal ileum.  Normal small bowel without abdominal ascites.  No pelvic adenopathy.    Normal urinary bladder and uterus.  No adnexal mass.  Trace free pelvic fluid is  likely physiologic.  Transitional right-sided lumbosacral anatomy.  Degenerative disc disease at the lumbosacral junction.  IMPRESSION:  No acute process or evidence of metastatic disease in the abdomen or pelvis.   Original Report Authenticated By: Jeronimo Greaves, M.D.   US Breast Right  06/29/2012   *RADIOLOGY REPORT*  Clinical Data:  52 year old female with newly diagnosed right breast cancer.  Suspicious abnormal enhancement anterior and superior to the biopsy-proven neoplasm - for second look ultrasound evaluation.  RIGHT BREAST ULTRASOUND  Comparison:  Prior mammograms, ultrasounds and MRI.  Findings: Ultrasound is performed, showing the biopsy-proven neoplasm at the 3 o'clock position of the right breast 4 cm from the nipple. No other discrete sonographic abnormalities are identified, specifically medial, superior, and anterior to the biopsy-proven neoplasm.  IMPRESSION: Patchy abnormal enhancement medial superior to the biopsy-proven neoplasm identified on recent MRI is not visualized sonographically as a discrete abnormality.  MR guided biopsy is recommended if breast conservation surgery is considered.  BI-RADS CATEGORY 6:  Known biopsy-proven malignancy - appropriate action should be taken.  RECOMMENDATION: MR guided right breast biopsy, which will be scheduled by our office.  I have discussed the findings and recommendations with the patient. Results were also provided in writing at the conclusion of the visit.  If applicable, a reminder letter will be sent to the patient regarding the next appointment.   Original Report Authenticated By: Harmon Pier, M.D.   US Breast Right  06/20/2012   *RADIOLOGY REPORT*  Clinical Data:  52 year old female for annual bilateral mammograms and palpable mass in the upper and inner right breast discovered on clinical examination.  DIGITAL DIAGNOSTIC BILATERAL MAMMOGRAM WITH CAD AND RIGHT BREAST ULTRASOUND:  Comparison:  03/21/2009 and prior mammograms dating back to  01/21/2005  Findings:  ACR Breast Density Category 4: The breast tissue is extremely dense.  Routine and spot compression views of the right breast demonstrate no evidence of suspicious mass or distortion. Scattered punctate calcifications within the breasts bilaterally are again identified.  Mammographic images were processed with CAD.  On physical exam, thickening is identified at the 12 o'clock position of the right breast 4 cm from the nipple. A firm palpable mass identified at the 3 o'clock position of the right breast 4 cm from the nipple.  Ultrasound is performed, showing no evidence of solid or cystic mass, distortion or abnormal areas of shadowing at the 12 o'clock position of  the right breast. A 7 x 6 x 17 mm slightly irregular oval hypoechoic mass with calcifications is horizontally oriented at the 3 o'clock position of the right breast 4 cm from the nipple.  IMPRESSION: Indeterminate 7 x 6 x 17 mm hypoechoic mass at the 3 o'clock position of the right breast.  Tissue sampling is recommended.  No mammographic evidence of breast malignancy bilaterally.  No mammographic, palpable or sonographic abnormality at the 12 o'clock position of the right breast, in the area of palpable concern.  BI-RADS CATEGORY 4:  Suspicious abnormality - biopsy should be considered.  RECOMMENDATION:  Ultrasound guided right breast biopsy, which will be performed today but dictated in a separate report.  I have discussed the findings and recommendations with the patient. Results were also provided in writing at the conclusion of the visit.  If applicable, a reminder letter will be sent to the patient regarding the next appointment.   Original Report Authenticated By: Harmon Pier, M.D.   Ct Abdomen Pelvis W Contrast  07/03/2012   *RADIOLOGY REPORT*  Clinical Data:  Breast cancer.  No therapy yet.  Lower inner quadrant of right breast.  CT CHEST, ABDOMEN AND PELVIS WITH CONTRAST  Technique: Contiguous axial images of the chest  abdomen and pelvis were obtained after IV contrast administration.  Contrast: 80 ml Omnipaque-300  Comparison: Breast MR of 06/26/2012.  No prior CTs.  CT CHEST  Findings: Lung windows demonstrate minimal left lower lobe subpleural nodularity at 3 mm on image 40/series 4.  Right lung clear.  Soft tissue windows demonstrate small bilateral axillary nodes, without adenopathy.  No subpectoral adenopathy. No supraclavicular adenopathy.  Normal heart size, without pericardial effusion.  No central pulmonary embolism, on this non-dedicated study.  No mediastinal or hilar adenopathy.  No internal mammary adenopathy. Minimal residual thymic tissue in the anterior mediastinum (image 17/series 2).  IMPRESSION:  1. No acute process or evidence of metastatic disease in the chest. 2.  Probable subpleural lymph node in the left lower lobe. Recommend attention on follow-up.  CT ABDOMEN AND PELVIS  Findings:  Mild early phase of hepatic enhancement which decreases sensitivity for focal liver lesions.  None identified.  Normal spleen, stomach, pancreas, gallbladder, biliary tract, adrenal glands, kidneys. No retroperitoneal or retrocrural adenopathy.  Normal colon, appendix, and terminal ileum.  Normal small bowel without abdominal ascites.  No pelvic adenopathy.    Normal urinary bladder and uterus.  No adnexal mass.  Trace free pelvic fluid is likely physiologic.  Transitional right-sided lumbosacral anatomy.  Degenerative disc disease at the lumbosacral junction.  IMPRESSION:  No acute process or evidence of metastatic disease in the abdomen or pelvis.   Original Report Authenticated By: Jeronimo Greaves, M.D.   Mr Breast Right W Wo Contrast  07/12/2012   *RADIOLOGY REPORT*  Clinical Data:  Known invasive mammary carcinoma in the right breast at 3 o'clock posteriorly.  Additional site of abnormal enhancement in the right upper inner quadrant closer to the nipple for possible biopsy.  ATTEMPTED MRI GUIDED VACUUM ASSISTED BIOPSY OF  THE RIGHT BREAST WITHOUT AND WITH CONTRAST  Comparison: Previous exams.  Technique: Multiplanar, multisequence MR images of the right breast were obtained prior to and following the intravenous administration of 9 ml of Mulithance.  I met with the patient, and we discussed the procedure of MRI guided biopsy, including risks, benefits, and alternatives. Specifically, we discussed the risks of infection, bleeding, tissue injury, clip migration, and inadequate sampling.  Informed, written consent was given.  Using sterile technique, 2% Lidocaine, MRI guidance, and a 9 gauge vacuum assisted device, biopsy was attempted using a mediolateral approach.  However, the procedure was unsuccessful due to the extremely thin breast compression.  IMPRESSION:  Unsuccessful MRI guided vacuum-assisted biopsy of the right breast.  Patient was sent to the [facility] for further evaluation with ultrasound.   Original Report Authenticated By: Cain Saupe, M.D.   Mr Breast Bilateral W Wo Contrast  06/26/2012   *RADIOLOGY REPORT*  Clinical Data: History of new diagnosis of breast cancer from recent ultrasound guided core needle biopsy right breast 06/20/2012 demonstrating invasive ductal carcinoma with DCIS.  GFR 89, creatinine 0.7 on 06/06/2012.  BILATERAL BREAST MRI WITH AND WITHOUT CONTRAST  Technique: Multiplanar, multisequence MR images of both breasts were obtained prior to and following the intravenous administration of 10ml of multihance.  Three dimensional images were evaluated at the independent DynaCad workstation.  Comparison:  Recent mammograms and ultrasound.  Findings: Examination demonstrates mild background parenchymal enhancement.  Right breast:  There is evidence of an ovoid gently lobulated and somewhat ill-defined heterogeneously enhancing mass over the deep third of the inner midportion of the right breast representing patient's biopsy-proven malignancy.  This mass contains central clip artifact and measures  approximately 0.9 x 1.9 x 1.0 cm. The anterior border of this mass is located 5.5 cm from the nipple. There is a 6 mm slightly ill-defined enhancing ovoid mass 6 mm posterior lateral to the biopsy-proven malignancy lying just superficial to the pectoralis muscle.  There is also clumped non mass enhancement anterior and slightly superior to the biopsy- proven malignancy in the middle third at approximately the 2 o'clock position.  This measures approximately 0.8 x 1.6 x 1.2 cm and is located 3 cm from the nipple.  This is suspicious for multifocal disease.  No suspicious axillary or internal mammary lymph nodes.  Left breast:  No suspicious mass, enhancement or adenopathy.  IMPRESSION: Known biopsy-proven malignancy over the deep third of the inner mid right breast.  6 mm ill-defined enhancing ovoid mass 6 mm posterior lateral to the biopsy-proven malignancy and lying just superficial to the pectoralis muscle.  Clumped non mass enhancement anterior and superior to patient's known malignancy at approximately the 2 o'clock position measuring 0.8 x 1.6 x 2.2 cm.  Findings likely represent multi focal disease.  RECOMMENDATION: Recommend second look ultrasound and core biopsy if feasible of the clumped non mass enhancement at approximately the 2 o'clock position right breast anterior/superior to the known malignancy. If not seen by ultrasound, recommend proceeding to MR guided biopsy of this suspicious abnormality.  THREE-DIMENSIONAL MR IMAGE RENDERING ON INDEPENDENT WORKSTATION:  Three-dimensional MR images were rendered by post-processing of the original MR data on an independent workstation.  The three- dimensional MR images were interpreted, and findings were reported in the accompanying complete MRI report for this study.  BI-RADS CATEGORY 0:  Incomplete.  Need additional imaging evaluation and/or prior mammograms for comparison.   Original Report Authenticated By: Elberta Fortis, M.D.   Dg Chest Port 1  View  07/06/2012   *RADIOLOGY REPORT*  Clinical Data: Post left-sided port catheter insertion  PORTABLE CHEST - 1 VIEW  Comparison: 07/04/2012  Findings:  Grossly unchanged cardiac silhouette and mediastinal contours. Interval placement of a left anterior chest wall subclavian vein approach port-a-catheter with tip projecting over the superior cavoatrial junction. Grossly unchanged symmetric biapical pleural parenchymal thickening.  No pneumothorax.  No focal airspace opacities.  No pleural effusion.  No evidence  of edema.  Unchanged bones.  IMPRESSION: Interval placement of a left anterior chest wall subclavian vein approach port-a-catheter with tip overlying the superior cavoatrial junction.  No pneumothorax.   Original Report Authenticated By: Tacey Ruiz, MD   Mm Digital Diagnostic Bilat  06/20/2012   *RADIOLOGY REPORT*  Clinical Data:  52 year old female for annual bilateral mammograms and palpable mass in the upper and inner right breast discovered on clinical examination.  DIGITAL DIAGNOSTIC BILATERAL MAMMOGRAM WITH CAD AND RIGHT BREAST ULTRASOUND:  Comparison:  03/21/2009 and prior mammograms dating back to 01/21/2005  Findings:  ACR Breast Density Category 4: The breast tissue is extremely dense.  Routine and spot compression views of the right breast demonstrate no evidence of suspicious mass or distortion. Scattered punctate calcifications within the breasts bilaterally are again identified.  Mammographic images were processed with CAD.  On physical exam, thickening is identified at the 12 o'clock position of the right breast 4 cm from the nipple. A firm palpable mass identified at the 3 o'clock position of the right breast 4 cm from the nipple.  Ultrasound is performed, showing no evidence of solid or cystic mass, distortion or abnormal areas of shadowing at the 12 o'clock position of the right breast. A 7 x 6 x 17 mm slightly irregular oval hypoechoic mass with calcifications is horizontally  oriented at the 3 o'clock position of the right breast 4 cm from the nipple.  IMPRESSION: Indeterminate 7 x 6 x 17 mm hypoechoic mass at the 3 o'clock position of the right breast.  Tissue sampling is recommended.  No mammographic evidence of breast malignancy bilaterally.  No mammographic, palpable or sonographic abnormality at the 12 o'clock position of the right breast, in the area of palpable concern.  BI-RADS CATEGORY 4:  Suspicious abnormality - biopsy should be considered.  RECOMMENDATION:  Ultrasound guided right breast biopsy, which will be performed today but dictated in a separate report.  I have discussed the findings and recommendations with the patient. Results were also provided in writing at the conclusion of the visit.  If applicable, a reminder letter will be sent to the patient regarding the next appointment.   Original Report Authenticated By: Harmon Pier, M.D.   Mm Digital Diagnostic Unilat R  07/12/2012   *RADIOLOGY REPORT*  Clinical Data:  Ultrasound-guided core needle biopsy of a hypoechoic area at 2 o'clock 2 cm from the right nipple with clip placement.  Known invasive mammary carcinoma in the right breast. Unsuccessful right breast MRI guided biopsy earlier today.  DIGITAL DIAGNOSTIC RIGHT MAMMOGRAM  Comparison:  Previous exams.  Findings:  Films are performed following ultrasound guided biopsy of a small hypoechoic area at 2 o'clock 2 cm from the right nipple. The ribbon clip is positioned within the area of concern. The ribbon clip is approximately 2.6 cm anterior to the top hat clip from the original biopsy on 06/20/2012.  IMPRESSION: Appropriate clip placement following ultrasound-guided core needle biopsy of a hypoechoic area at 2 o'clock 2 cm in the right nipple.   Original Report Authenticated By: Cain Saupe, M.D.   Mm Digital Diagnostic Unilat R  06/20/2012   *RADIOLOGY REPORT*  Clinical Data:  51 year old female - evaluate clip placement following ultrasound guided right  breast biopsy.  DIGITAL DIAGNOSTIC RIGHT MAMMOGRAM  Comparison:  Previous exams.  Findings:  Films are performed following ultrasound guided biopsy of the 7 x 6 x 17 mm hypoechoic mass at the 3 o'clock position of the right breast 4 cm from  the nipple. The T shaped biopsy clip is in satisfactory position.  IMPRESSION: Satisfactory clip placement following ultrasound guided right breast biopsy.  Pathology will be followed.   Original Report Authenticated By: Harmon Pier, M.D.   Dg Fluoro Guide Cv Line-no Report  07/06/2012   CLINICAL DATA: port placement   FLOURO GUIDE CV LINE  Fluoroscopy was utilized by the requesting physician.  No radiographic  interpretation.    [place] Rt Breast Bx W Loc Dev 1st Lesion Img Bx Spec US Guide  07/13/2012   **ADDENDUM** CREATED: 07/13/2012 14:11:45  I spoke with the patient by telephone on 07/13/2012 to discuss pathology results.  Pathology demonstrates  pseudoangiomatous stromal hyperplasia.  The findings were called to the office of Dr. Donell Beers. Although the imaging findings could be related to pseudoangiomatous stromal hyperplasia, excision of the anterior biopsy site is suggested at the time of patient's definitive surgery.  The patient is considering unilateral or bilateral mastectomy at this time.  The patient reports no problems at the biopsy site.  All questions were answered.  Recommendations:  Excision of the biopsy site. The patient is scheduled to begin chemotherapy in 4 days.  **END ADDENDUM** SIGNED BY: Blair Hailey. Manson Passey, M.D.  07/12/2012   *RADIOLOGY REPORT*  Clinical Data:  Unsuccessful MRI guided biopsy of the area of abnormal enhancement in the left anterior upper inner quadrant, anterior to the known invasive mammary carcinoma.  Second look ultrasound performed and a 5 x 4 x 4 mm hypoechoic area found at 2 o'clock 2 cm from the right nipple which will be biopsied.  ULTRASOUND GUIDED VACUUM ASSISTED CORE BIOPSY OF THE RIGHT BREAST  Sonography of the right upper  inner quadrant was performed.  There is an ill-defined hypoechoic 5 x 4 x 4 mm area at 2 o'clock 2 cm from the right nipple which will be biopsied with ultrasound guidance.  The patient and I discussed the procedure of ultrasound-guided biopsy, including benefits and alternatives.  We discussed the high likelihood of a successful procedure. We discussed the risks of the procedure including infection, bleeding, tissue injury, clip migration, and inadequate sampling.  Written informed consent was given.  Using sterile technique, 2% lidocaine, ultrasound guidance, and a 12 gauge vacuum assisted needle, biopsy was performed of the hypoechoic area at 2 o'clock 2 cm from the right nipple using a caudocranial approach.  At the conclusion of the procedure, a within tissue marker clip was deployed into the biopsy cavity. Follow-up 2-view mammogram was performed and dictated separately.  IMPRESSION: Ultrasound-guided biopsy of a 5 x 4 x 4 mm hypoechoic area at 2 o'clock 2 cm from the right nipple.  No apparent complications.   Original Report Authenticated By: Cain Saupe, M.D.   [place] Rt Breast Bx W Loc Dev 1st Lesion Img Bx Spec US Guide  06/21/2012   *RADIOLOGY REPORT*  Clinical Data:  Indeterminate 7 x 6 x 17 mm hypoechoic mass in the inner right breast - for tissue sampling.  ULTRASOUND GUIDED VACUUM ASSISTED CORE BIOPSY OF THE RIGHT BREAST  Comparison: Previous exams.  I met with the patient and we discussed the procedure of ultrasound- guided biopsy, including benefits and alternatives.  We discussed the high likelihood of a successful procedure. We discussed the risks of the procedure including infection, bleeding, tissue injury, clip migration, and inadequate sampling.  Informed written consent was given.  Using sterile technique, 2% lidocaine ultrasound guidance and a 12 gauge vacuum assisted needle biopsy was performed of the 7 x  6 x 17 mm hypoechoic mass at the 3 o'clock position of the right breast 4 cm from  the nipple using a lateral approach.  At the conclusion of the procedure, a T shaped tissue marker clip was deployed into the biopsy cavity.  Follow-up 2-view mammogram was performed and dictated separately.  IMPRESSION: Ultrasound-guided biopsy of right breast mass.  No apparent complications.  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 Histology correlates with imaging findings.  The patient was contacted by phone on 06/21/2012 and these results given to her which she understood. Her questions were answered. She was encouraged to obtain breast cancer educational material from the [facility] at her convenience. The patient had no complaints with her biopsy site.  Recommend surgery/oncology consultation.  An appointment at the [facility] has been scheduled for 06/28/2012. Recommend bilateral breast MRI, which has been scheduled for 06/26/2012. The patient was informed of these appointments.   Original Report Authenticated By: Harmon Pier, M.D.    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 Patient had other areas seen on MRI biopsy of the second mass was negative. Patient has had her Port-A-Cath placed. She is proceeding neoadjuvant chemotherapy to possibly conserve her breast. Chemotherapy will consist of Adriamycin Cytoxan given dose dense for 4 cycles starting today 07/17/2012. She will receive Neulasta support on day 2 for each cycle. When she completes this then she will begin weekly Taxol and carboplatinum for a total of 12 weeks.  #2 we discussed risks benefits and side effects of chemotherapy. She has had an echocardiogram as well as chemotherapy teaching class. Port-A-Cath was placed by Dr. Donell Beers. The port is functioning well.  #3 Taxol/Carbo cycle 4 today  #4 vaginal herpes outbreak  #5 neuropathy    PLAN:  #1 patient will proceed with cycle 4 of Taxol  carbo today  #2 pressure during urination patient may have a urinary tract infection and we will check urinalysis and culture.  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  #4 patient will return in one week's time for cycle 5 of Taxol/ carboplatinum.  All questions were answered. The patient knows to call the clinic with any problems, questions or concerns. We can certainly see the patient much sooner if necessary.  I spent 25 minutes counseling the patient face to face. The total time spent in the appointment was 30 minutes.  Drue Second, MD Medical/Oncology [facility] [phone] (beeper) [phone] (Office)  10/02/2012, 2:42 PM

## 2012-10-02 NOTE — Progress Notes (Signed)
Patient with c/o pain and burning during urination @ 1730. Urine sample done after MD appt. Pt requesting prescription for symptoms, none given during MD appt.   Per MD on call, Dr Truett Perna,  pt to take Cipro. In patients med list, with refills. Patient verbalized understanding. Patient to return Tuesday for completion of Carbo. Denies further questions at this time.

## 2012-10-02 NOTE — Telephone Encounter (Signed)
, °

## 2012-10-02 NOTE — Patient Instructions (Addendum)
Proceed with taxol/carboplatin today  Urine test for infection  Take neurontin 100 mg twice a day for the pins and needles from the chemotherapy  Take super B complex daily (over the counter)

## 2012-10-02 NOTE — Telephone Encounter (Signed)
Per staff message and POF I have scheduled appts.  JMW  

## 2012-10-02 NOTE — Patient Instructions (Addendum)
[facility] Discharge Instructions for Patients Receiving Chemotherapy  Today you received the following chemotherapy agents Taxol  To help prevent nausea and vomiting after your treatment, we encourage you to take your nausea medication    If you develop nausea and vomiting that is not controlled by your nausea medication, call the clinic.   BELOW ARE SYMPTOMS THAT SHOULD BE REPORTED IMMEDIATELY:  *FEVER GREATER THAN 100.5 F  *CHILLS WITH OR WITHOUT FEVER  NAUSEA AND VOMITING THAT IS NOT CONTROLLED WITH YOUR NAUSEA MEDICATION  *UNUSUAL SHORTNESS OF BREATH  *UNUSUAL BRUISING OR BLEEDING  TENDERNESS IN MOUTH AND THROAT WITH OR WITHOUT PRESENCE OF ULCERS  *URINARY PROBLEMS  *BOWEL PROBLEMS  UNUSUAL RASH Items with * indicate a potential emergency and should be followed up as soon as possible.  Feel free to call the clinic you have any questions or concerns. The clinic phone number is [phone].    

## 2012-10-02 NOTE — Progress Notes (Signed)
Informed by RNs Mena Pauls and Chauncy Lean, upon checking on Carbo dosing, noticed bag was wet and fluids in bio-hardzard bag.  I called and spoke to DR. Welton Flakes and notified of above and Pharmacy is closed to remix on site.  Order okay to reschedule pt. to return for Carbo tomorrow.  MD aware that pt. Did not receive any Carbo today.  POF sent to reschedule and Primary nurse informed pt. of rescheduled appt.  HL

## 2012-10-03 ENCOUNTER — Ambulatory Visit (HOSPITAL_BASED_OUTPATIENT_CLINIC_OR_DEPARTMENT_OTHER): Payer: Medicaid Other

## 2012-10-03 ENCOUNTER — Other Ambulatory Visit: Payer: Self-pay | Admitting: Emergency Medicine

## 2012-10-03 ENCOUNTER — Other Ambulatory Visit: Payer: Self-pay | Admitting: *Deleted

## 2012-10-03 VITALS — BP 126/76 | HR 81 | Temp 97.6°F

## 2012-10-03 DIAGNOSIS — Z5111 Encounter for antineoplastic chemotherapy: Secondary | ICD-10-CM

## 2012-10-03 DIAGNOSIS — C50319 Malignant neoplasm of lower-inner quadrant of unspecified female breast: Secondary | ICD-10-CM

## 2012-10-03 MED ORDER — CIPROFLOXACIN HCL 500 MG PO TABS: 500.0000 mg | ORAL_TABLET | Freq: Two times a day (BID) | ORAL | Status: DC

## 2012-10-03 MED ORDER — DEXAMETHASONE SODIUM PHOSPHATE 10 MG/ML IJ SOLN
10.0000 mg | Freq: Once | INTRAMUSCULAR | Status: AC
  Administered 2012-10-03: 10 mg via INTRAVENOUS

## 2012-10-03 MED ORDER — ONDANSETRON 8 MG/50ML IVPB (CHCC)
8.0000 mg | Freq: Once | INTRAVENOUS | Status: AC
  Administered 2012-10-03: 8 mg via INTRAVENOUS

## 2012-10-03 MED ORDER — SODIUM CHLORIDE 0.9 % IV SOLN
220.0000 mg | Freq: Once | INTRAVENOUS | Status: AC
  Administered 2012-10-03: 220 mg via INTRAVENOUS
  Filled 2012-10-03: qty 22

## 2012-10-03 MED ORDER — ONDANSETRON 8 MG/NS 50 ML IVPB
INTRAVENOUS | Status: AC
  Filled 2012-10-03: qty 8

## 2012-10-03 MED ORDER — HEPARIN SOD (PORK) LOCK FLUSH 100 UNIT/ML IV SOLN
500.0000 [IU] | Freq: Once | INTRAVENOUS | Status: AC
  Administered 2012-10-03: 500 [IU] via INTRAVENOUS
  Filled 2012-10-03: qty 5

## 2012-10-03 MED ORDER — DEXAMETHASONE SODIUM PHOSPHATE 10 MG/ML IJ SOLN
INTRAMUSCULAR | Status: AC
  Administered 2012-10-03: 10 mg via INTRAVENOUS
  Filled 2012-10-03: qty 1

## 2012-10-03 MED ORDER — SODIUM CHLORIDE 0.9 % IV SOLN
INTRAVENOUS | Status: DC
  Administered 2012-10-03: 13:00:00 via INTRAVENOUS

## 2012-10-03 MED ORDER — SODIUM CHLORIDE 0.9 % IJ SOLN
10.0000 mL | INTRAMUSCULAR | Status: DC | PRN
  Administered 2012-10-03: 10 mL via INTRAVENOUS
  Filled 2012-10-03: qty 10

## 2012-10-03 NOTE — Patient Instructions (Addendum)
[facility] Discharge Instructions for Patients Receiving Chemotherapy  Today you received the following chemotherapy agents Carboplatin To help prevent nausea and vomiting after your treatment, we encourage you to take your nausea medication as prescribed.  If you develop nausea and vomiting that is not controlled by your nausea medication, call the clinic.   BELOW ARE SYMPTOMS THAT SHOULD BE REPORTED IMMEDIATELY:  *FEVER GREATER THAN 100.5 F  *CHILLS WITH OR WITHOUT FEVER  NAUSEA AND VOMITING THAT IS NOT CONTROLLED WITH YOUR NAUSEA MEDICATION  *UNUSUAL SHORTNESS OF BREATH  *UNUSUAL BRUISING OR BLEEDING  TENDERNESS IN MOUTH AND THROAT WITH OR WITHOUT PRESENCE OF ULCERS  *URINARY PROBLEMS  *BOWEL PROBLEMS  UNUSUAL RASH Items with * indicate a potential emergency and should be followed up as soon as possible.  Feel free to call the clinic you have any questions or concerns. The clinic phone number is [phone].    

## 2012-10-04 LAB — URINE CULTURE

## 2012-10-05 ENCOUNTER — Telehealth: Payer: Self-pay | Admitting: *Deleted

## 2012-10-05 NOTE — Telephone Encounter (Signed)
Call received from patient after reports having left message with collaborative nurse. Reports she started Cipro for yeast infection Monday but still notes pain/pressure with urination and today when she wiped there was pink and redness on the tissue.  Reports drinking water, cranberry juice and denies any sexual activity.  Reports last menstruation was in June.  Will route this information to providers to add to previous message left

## 2012-10-06 ENCOUNTER — Other Ambulatory Visit: Payer: Self-pay | Admitting: *Deleted

## 2012-10-06 MED ORDER — DEXAMETHASONE 4 MG PO TABS: 4.0000 mg | ORAL_TABLET | Freq: Two times a day (BID) | ORAL | Status: DC

## 2012-10-09 ENCOUNTER — Ambulatory Visit (HOSPITAL_BASED_OUTPATIENT_CLINIC_OR_DEPARTMENT_OTHER): Payer: Medicaid Other

## 2012-10-09 ENCOUNTER — Other Ambulatory Visit (HOSPITAL_BASED_OUTPATIENT_CLINIC_OR_DEPARTMENT_OTHER): Payer: Medicaid Other | Admitting: Lab

## 2012-10-09 ENCOUNTER — Encounter: Payer: Self-pay | Admitting: Adult Health

## 2012-10-09 ENCOUNTER — Ambulatory Visit (HOSPITAL_BASED_OUTPATIENT_CLINIC_OR_DEPARTMENT_OTHER): Payer: Medicaid Other | Admitting: Adult Health

## 2012-10-09 VITALS — BP 120/75 | HR 106 | Temp 98.8°F | Resp 18 | Ht 63.0 in | Wt 119.5 lb

## 2012-10-09 DIAGNOSIS — J069 Acute upper respiratory infection, unspecified: Secondary | ICD-10-CM

## 2012-10-09 DIAGNOSIS — Z5111 Encounter for antineoplastic chemotherapy: Secondary | ICD-10-CM

## 2012-10-09 DIAGNOSIS — C50319 Malignant neoplasm of lower-inner quadrant of unspecified female breast: Secondary | ICD-10-CM

## 2012-10-09 DIAGNOSIS — C50919 Malignant neoplasm of unspecified site of unspecified female breast: Secondary | ICD-10-CM

## 2012-10-09 DIAGNOSIS — N390 Urinary tract infection, site not specified: Secondary | ICD-10-CM

## 2012-10-09 DIAGNOSIS — C50311 Malignant neoplasm of lower-inner quadrant of right female breast: Secondary | ICD-10-CM

## 2012-10-09 DIAGNOSIS — D702 Other drug-induced agranulocytosis: Secondary | ICD-10-CM

## 2012-10-09 LAB — CBC WITH DIFFERENTIAL/PLATELET
BASO%: 0.4 % (ref 0.0–2.0)
Eosinophils Absolute: 0 10*3/uL (ref 0.0–0.5)
LYMPH%: 14.6 % (ref 14.0–49.7)
MCH: 31.3 pg (ref 25.1–34.0)
MCHC: 34.3 g/dL (ref 31.5–36.0)
MCV: 91.1 fL (ref 79.5–101.0)
MONO%: 6.6 % (ref 0.0–14.0)
Platelets: 186 10*3/uL (ref 145–400)
RBC: 2.91 10*6/uL — ABNORMAL LOW (ref 3.70–5.45)
nRBC: 0 % (ref 0–0)

## 2012-10-09 LAB — COMPREHENSIVE METABOLIC PANEL (CC13)
ALT: 16 U/L (ref 0–55)
CO2: 25 mEq/L (ref 22–29)
Creatinine: 0.7 mg/dL (ref 0.6–1.1)
Total Bilirubin: 0.35 mg/dL (ref 0.20–1.20)

## 2012-10-09 LAB — URINALYSIS, MICROSCOPIC - CHCC
Leukocyte Esterase: NEGATIVE
Nitrite: NEGATIVE
Protein: 30 mg/dL
Urobilinogen, UR: 0.2 mg/dL (ref 0.2–1)
pH: 6 (ref 4.6–8.0)

## 2012-10-09 MED ORDER — PACLITAXEL CHEMO INJECTION 300 MG/50ML
80.0000 mg/m2 | Freq: Once | INTRAVENOUS | Status: AC
  Administered 2012-10-09: 126 mg via INTRAVENOUS
  Filled 2012-10-09: qty 21

## 2012-10-09 MED ORDER — SODIUM CHLORIDE 0.9 % IV SOLN
217.0000 mg | Freq: Once | INTRAVENOUS | Status: AC
  Administered 2012-10-09: 220 mg via INTRAVENOUS
  Filled 2012-10-09: qty 22

## 2012-10-09 MED ORDER — ONDANSETRON 16 MG/50ML IVPB (CHCC)
16.0000 mg | Freq: Once | INTRAVENOUS | Status: AC
  Administered 2012-10-09: 16 mg via INTRAVENOUS

## 2012-10-09 MED ORDER — DIPHENHYDRAMINE HCL 50 MG/ML IJ SOLN
25.0000 mg | Freq: Once | INTRAMUSCULAR | Status: AC
  Administered 2012-10-09: 25 mg via INTRAVENOUS

## 2012-10-09 MED ORDER — SODIUM CHLORIDE 0.9 % IV SOLN
Freq: Once | INTRAVENOUS | Status: AC
  Administered 2012-10-09: 16:00:00 via INTRAVENOUS

## 2012-10-09 MED ORDER — DEXAMETHASONE SODIUM PHOSPHATE 20 MG/5ML IJ SOLN
20.0000 mg | Freq: Once | INTRAMUSCULAR | Status: AC
  Administered 2012-10-09: 20 mg via INTRAVENOUS

## 2012-10-09 MED ORDER — FAMOTIDINE IN NACL 20-0.9 MG/50ML-% IV SOLN
20.0000 mg | Freq: Once | INTRAVENOUS | Status: AC
  Administered 2012-10-09: 20 mg via INTRAVENOUS

## 2012-10-09 MED ORDER — AZITHROMYCIN 250 MG PO TABS: ORAL_TABLET | ORAL | Status: DC

## 2012-10-09 MED ORDER — DEXAMETHASONE SODIUM PHOSPHATE 20 MG/5ML IJ SOLN
INTRAMUSCULAR | Status: AC
  Filled 2012-10-09: qty 5

## 2012-10-09 MED ORDER — SODIUM CHLORIDE 0.9 % IJ SOLN
10.0000 mL | INTRAMUSCULAR | Status: DC | PRN
  Administered 2012-10-09: 10 mL
  Filled 2012-10-09: qty 10

## 2012-10-09 MED ORDER — DIPHENHYDRAMINE HCL 50 MG/ML IJ SOLN
INTRAMUSCULAR | Status: AC
  Filled 2012-10-09: qty 1

## 2012-10-09 MED ORDER — HEPARIN SOD (PORK) LOCK FLUSH 100 UNIT/ML IV SOLN
500.0000 [IU] | Freq: Once | INTRAVENOUS | Status: AC | PRN
  Administered 2012-10-09: 500 [IU]
  Filled 2012-10-09: qty 5

## 2012-10-09 MED ORDER — FAMOTIDINE IN NACL 20-0.9 MG/50ML-% IV SOLN
INTRAVENOUS | Status: AC
  Filled 2012-10-09: qty 50

## 2012-10-09 MED ORDER — ONDANSETRON 16 MG/50ML IVPB (CHCC)
INTRAVENOUS | Status: AC
  Filled 2012-10-09: qty 16

## 2012-10-09 NOTE — Patient Instructions (Signed)
[facility] Discharge Instructions for Patients Receiving Chemotherapy  Today you received the following chemotherapy agents Taxol/Carbo  To help prevent nausea and vomiting after your treatment, we encourage you to take your nausea medication as needed   If you develop nausea and vomiting that is not controlled by your nausea medication, call the clinic.   BELOW ARE SYMPTOMS THAT SHOULD BE REPORTED IMMEDIATELY:  *FEVER GREATER THAN 100.5 F  *CHILLS WITH OR WITHOUT FEVER  NAUSEA AND VOMITING THAT IS NOT CONTROLLED WITH YOUR NAUSEA MEDICATION  *UNUSUAL SHORTNESS OF BREATH  *UNUSUAL BRUISING OR BLEEDING  TENDERNESS IN MOUTH AND THROAT WITH OR WITHOUT PRESENCE OF ULCERS  *URINARY PROBLEMS  *BOWEL PROBLEMS  UNUSUAL RASH Items with * indicate a potential emergency and should be followed up as soon as possible.  Feel free to call the clinic you have any questions or concerns. The clinic phone number is [phone].    

## 2012-10-09 NOTE — Progress Notes (Signed)
OFFICE PROGRESS NOTE  CC  [name], MD [address] [place] [address] Dr. Everardo Beals Dr. 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 Patient had a palpable mass at the 3:00 position. She was seen at the breast Center on 06/20/2012 she had a mammogram and ultrasound showing a 1.7 cm mass at the 3:00 position. 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 Tumor was grade 3.  #2 MRIs performed on 06/26/2012 showed no biopsy mass at 3:00 but also 2 other adjacent masses at 1 at 2:00 measuring 0.8 x 1.6 x 2.2 cm andalso 6 mm ill-defined mass posterior to the biopsy-proven malignancy.  #3 patient has gone on to have a Port-A-Cath placed to begin neoadjuvant chemotherapy initially consisting of Adriamycin Cytoxan to be given dose dense for 4 cycles followed by weekly Taxol and carboplatinum for a total of 12 weeks.carefully agent made to have an excellent response to neoadjuvant chemotherapy and could possibly have breast preservation of course she may even need chest mastectomy with sentinel lymph node biopsies. Patient and I had met at the [facility] clinic we discussed the rationale for upfront chemotherapy she is now here to receive that.   #4 patient is now status post Adriamycin Cytoxan given dose dense for 4 cycles administered from 07/17/2012 through 09/04/2012 neoadjuvant bleeding.  #5 she was then begun on weekly Taxol carboplatinum beginning 09/11/2012  CURRENT THERAPY: Taxol Carbo cycle 5  INTERVAL HISTORY: Berline R Rua 52 y.o. female returns for evaluation prior to Taxol/Carbo cycle 5.  She is doing well today.  She has a cough and nasal drainage that  started yesterday.  Her cough is non-productive.  She denies fevers, chills, chest pain, or any other symptoms.  She also started having numbness last week and was prescribed BID Neurontin.  She did well with this and her numbness is improved.  It remains minimal in her fingertips and nothing has been noted in her toes.  She denies nausea, vomiting, constipation, diarrhea.  She recently completed cipro for UTI, but still has pressure with urination.  Otherwise, she is without questions or concerns.   MEDICAL HISTORY: Past Medical History  Diagnosis Date  . HSV infection   . NSVD (normal spontaneous vaginal delivery)   . Hypertension     takes HCTZ  . Urinary frequency   . Anemia     takes Ferrous Sulfate occasionally  . Breast cancer 2014    triple negative    ALLERGIES:  has No Known Allergies.  MEDICATIONS:  Current Outpatient Prescriptions  Medication Sig Dispense Refill  . ciprofloxacin (CIPRO) 500 MG tablet Take 1 tablet (500 mg total) by mouth 2 (two) times daily.  14 tablet  0  . dexamethasone (DECADRON) 4 MG tablet Take 1 tablet (4 mg total) by mouth 2 (two) times daily with a meal. Take 2 tabs every day on the day after chemo an then take 2 tabs twice a day with food for 2 days.  60 tablet  0  . gabapentin (NEURONTIN) 100 MG capsule Take 1 capsule twice a day  60 capsule  5  . lidocaine-prilocaine (EMLA) cream Apply 1 application topically as needed (apply to PAC site 1-2 hours prior to treatment.).      Marland Kitchen  LORazepam (ATIVAN) 0.5 MG tablet Take 0.5 mg by mouth every 6 (six) hours as needed for anxiety (as needed for nausea and vomitting).      . ondansetron (ZOFRAN) 8 MG tablet Take 8 mg by mouth every 8 (eight) hours as needed for nausea (Take 1 pill by mouth twice a day as needed  for nausea and vomitting starting 3rd day after chemo).      . valACYclovir (VALTREX) 500 MG tablet Take 500 mg by mouth daily as needed. For herpes      . Alum & Mag Hydroxide-Simeth (MAGIC MOUTHWASH  W/LIDOCAINE) SOLN Take 5 mL by mouth 4 (four) times daily. Swish and spit  500 mL  0  . azithromycin (ZITHROMAX) 250 MG tablet 2 tabs po x day 1, then one tab daily until complete.  6 each  0  . ferrous sulfate 325 (65 FE) MG tablet Take 325 mg by mouth daily with breakfast.      . hydrochlorothiazide (HYDRODIURIL) 25 MG tablet Take 1 tablet (25 mg total) by mouth daily.  30 tablet  11  . oxyCODONE-acetaminophen (ROXICET) 5-325 MG per tablet Take 1-2 tablets by mouth every 4 (four) hours as needed for pain.  30 tablet  0  . prochlorperazine (COMPAZINE) 10 MG tablet Take 10 mg by mouth every 6 (six) hours as needed (for nausea and vomitting).      . prochlorperazine (COMPAZINE) 25 MG suppository Place 25 mg rectally every 12 (twelve) hours as needed for nausea (as needed for nausea and vomitting).      Marland Kitchen UNABLE TO FIND Apply 1 application topically once. 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  1 application  0   No current facility-administered medications for this visit.    SURGICAL HISTORY:  Past Surgical History  Procedure Laterality Date  . Tubal ligation  2003    LTL  . Portacath placement N/A 07/06/2012    Procedure: INSERTION PORT-A-CATH;  Surgeon: Almond Lint, MD;  Location: [facility] OR;  Service: General;  Laterality: N/A;    REVIEW OF SYSTEMS: A 10 point review of systems was conducted and is otherwise negative except for what is noted above.     PHYSICAL EXAMINATION: Blood pressure 120/75, pulse 106, temperature 98.8 F (37.1 C), temperature source Oral, resp. rate 18, height 5\' 3"  (1.6 m), weight 119 lb 8 oz (54.205 kg), SpO2 98.00%. Body mass index is 21.17 kg/(m^2). General: Patient is a well appearing female in no acute distress HEENT: PERRLA, sclerae anicteric no conjunctival pallor, MMM Neck: supple, no palpable adenopathy Lungs: clear to auscultation bilaterally, no wheezes, rhonchi, or rales Cardiovascular: regular rate  rhythm, S1, S2, no murmurs, rubs or gallops Abdomen: Soft, non-tender, non-distended, normoactive bowel sounds, no HSM Extremities: warm and well perfused, no clubbing, cyanosis, or edema Skin: No rashes or lesions Neuro: Non-focal Breast examination: Left breast no masses or nipple discharge Right breast reveals 3 discrete masses at the 3:00 position 2:00 position.  It is nontender there is no nipple discharge. Conglomeration of these masses to my exam measure about 2-3cm. ECOG PERFORMANCE STATUS: 0 - Asymptomatic  LABORATORY DATA: Lab Results  Component Value Date   WBC 5.2 10/09/2012   HGB 9.1* 10/09/2012   HCT 26.5* 10/09/2012   MCV 91.1 10/09/2012   PLT 186 10/09/2012      Chemistry      Component Value Date/Time   NA 136 10/09/2012 1313   NA 137 06/06/2012 0853  K 3.7 10/09/2012 1313   K 3.8 06/06/2012 0853   CL 104 07/17/2012 1357   CL 102 06/06/2012 0853   CO2 25 10/09/2012 1313   CO2 26 06/06/2012 0853   BUN 13.8 10/09/2012 1313   BUN 12 06/06/2012 0853   CREATININE 0.7 10/09/2012 1313   CREATININE 0.70 06/06/2012 0853   CREATININE 0.85 04/24/2009 1114      Component Value Date/Time   CALCIUM 8.5 10/09/2012 1313   CALCIUM 9.3 06/06/2012 0853   ALKPHOS 47 10/09/2012 1313   ALKPHOS 35* 06/06/2012 0853   AST 21 10/09/2012 1313   AST 14 06/06/2012 0853   ALT 16 10/09/2012 1313   ALT 9 06/06/2012 0853   BILITOT 0.35 10/09/2012 1313   BILITOT 0.8 06/06/2012 0853     ADDITIONAL INFORMATION: PROGNOSTIC INDICATORS - ACIS Results: IMMUNOHISTOCHEMICAL AND MORPHOMETRIC ANALYSIS BY THE AUTOMATED CELLULAR IMAGING SYSTEM (ACIS) Estrogen Receptor: 0%, NEGATIVE Progesterone Receptor: 0%, NEGATIVE Proliferation Marker Ki67: 95% COMMENT: The negative hormone receptor study(ies) in this case have an internal positive control. REFERENCE RANGE ESTROGEN RECEPTOR NEGATIVE <1% POSITIVE =>1% PROGESTERONE RECEPTOR NEGATIVE <1% POSITIVE =>1% All controls stained appropriately Pecola Leisure  MD Pathologist, Electronic Signature ( Signed 06/28/2012) CHROMOGENIC IN-SITU HYBRIDIZATION Results: HER-2/NEU BY CISH - NO AMPLIFICATION OF HER-2 DETECTED. RESULT RATIO OF HER2: CEP 17 SIGNALS 1.26 1 of 3 Duplicate copy FINAL for RIYANSHI, WAHAB [phone]) ADDITIONAL INFORMATION:(continued) AVERAGE HER2 COPY NUMBER PER CELL 2.45 REFERENCE RANGE NEGATIVE HER2/Chr17 Ratio <2.0 and Average HER2 copy number <4.0 EQUIVOCAL HER2/Chr17 Ratio <2.0 and Average HER2 copy number 4.0 and <6.0 POSITIVE HER2/Chr17 Ratio >=2.0 and/or Average HER2 copy number >=6.0 Jimmy Picket MD Pathologist, Electronic Signature ( Signed 06/26/2012) FINAL DIAGNOSIS Diagnosis Breast, right, needle core biopsy, 3:00 position - INVASIVE DUCTAL CARCINOMA SEE COMMENT. - DUCTAL CARCINOMA IN SITU Microscopic Comment Although the grade of tumor is best assessed at resection, with these biopsies, both the invasive and in situ carcinoma are grade III. Breast prognostic studies are pending and will be reported in an addendum. The case is reviewed with Dr. Raynald Blend who concurs. (CRR:gt, 06/21/12) [place] RUND DO Pathologist, Electronic FINAL DIAGNOSIS Diagnosis Breast, right, needle core biopsy, mass - BENIGN BREAST TISSUE WITH FOCAL PSEUDOANGIOMATOUS STROMAL HYPERPLASIA (PASH). NO EVIDENCE OF MALIGNANCY. Microscopic Comment [facility] on 07/13/12. (JDP:caf 07/13/12)  RADIOGRAPHIC STUDIES:  Dg Chest 2 View  07/04/2012   *RADIOLOGY REPORT*  Clinical Data: Insertion of Port-A-Cath.  Breast cancer. Hypertension.  CHEST - 2 VIEW  Comparison: CT of the chest on 07/03/2012  Findings: Cardiomediastinal silhouette is within normal limits. The lungs are free of focal consolidations and pleural effusions. Bony structures have a normal appearance.  Contrast is identified within the colon following CT of the abdomen.  IMPRESSION: Negative exam.   Original Report Authenticated By: Norva Pavlov, M.D.    Ct Chest W Contrast  07/03/2012   *RADIOLOGY REPORT*  Clinical Data:  Breast cancer.  No therapy yet.  Lower inner quadrant of right breast.  CT CHEST, ABDOMEN AND PELVIS WITH CONTRAST  Technique: Contiguous axial images of the chest abdomen and pelvis were obtained after IV contrast administration.  Contrast: 80 ml Omnipaque-300  Comparison: Breast MR of 06/26/2012.  No prior CTs.  CT CHEST  Findings: Lung windows demonstrate minimal left lower lobe subpleural nodularity at 3 mm on image 40/series 4.  Right lung clear.  Soft tissue windows demonstrate small bilateral axillary nodes, without adenopathy.  No subpectoral adenopathy. No supraclavicular  adenopathy.  Normal heart size, without pericardial effusion.  No central pulmonary embolism, on this non-dedicated study.  No mediastinal or hilar adenopathy.  No internal mammary adenopathy. Minimal residual thymic tissue in the anterior mediastinum (image 17/series 2).  IMPRESSION:  1. No acute process or evidence of metastatic disease in the chest. 2.  Probable subpleural lymph node in the left lower lobe. Recommend attention on follow-up.  CT ABDOMEN AND PELVIS  Findings:  Mild early phase of hepatic enhancement which decreases sensitivity for focal liver lesions.  None identified.  Normal spleen, stomach, pancreas, gallbladder, biliary tract, adrenal glands, kidneys. No retroperitoneal or retrocrural adenopathy.  Normal colon, appendix, and terminal ileum.  Normal small bowel without abdominal ascites.  No pelvic adenopathy.    Normal urinary bladder and uterus.  No adnexal mass.  Trace free pelvic fluid is likely physiologic.  Transitional right-sided lumbosacral anatomy.  Degenerative disc disease at the lumbosacral junction.  IMPRESSION:  No acute process or evidence of metastatic disease in the abdomen or pelvis.   Original Report Authenticated By: Jeronimo Greaves, M.D.   US Breast Right  06/29/2012   *RADIOLOGY REPORT*  Clinical Data:  52 year old female  with newly diagnosed right breast cancer.  Suspicious abnormal enhancement anterior and superior to the biopsy-proven neoplasm - for second look ultrasound evaluation.  RIGHT BREAST ULTRASOUND  Comparison:  Prior mammograms, ultrasounds and MRI.  Findings: Ultrasound is performed, showing the biopsy-proven neoplasm at the 3 o'clock position of the right breast 4 cm from the nipple. No other discrete sonographic abnormalities are identified, specifically medial, superior, and anterior to the biopsy-proven neoplasm.  IMPRESSION: Patchy abnormal enhancement medial superior to the biopsy-proven neoplasm identified on recent MRI is not visualized sonographically as a discrete abnormality.  MR guided biopsy is recommended if breast conservation surgery is considered.  BI-RADS CATEGORY 6:  Known biopsy-proven malignancy - appropriate action should be taken.  RECOMMENDATION: MR guided right breast biopsy, which will be scheduled by our office.  I have discussed the findings and recommendations with the patient. Results were also provided in writing at the conclusion of the visit.  If applicable, a reminder letter will be sent to the patient regarding the next appointment.   Original Report Authenticated By: Harmon Pier, M.D.   US Breast Right  06/20/2012   *RADIOLOGY REPORT*  Clinical Data:  52 year old female for annual bilateral mammograms and palpable mass in the upper and inner right breast discovered on clinical examination.  DIGITAL DIAGNOSTIC BILATERAL MAMMOGRAM WITH CAD AND RIGHT BREAST ULTRASOUND:  Comparison:  03/21/2009 and prior mammograms dating back to 01/21/2005  Findings:  ACR Breast Density Category 4: The breast tissue is extremely dense.  Routine and spot compression views of the right breast demonstrate no evidence of suspicious mass or distortion. Scattered punctate calcifications within the breasts bilaterally are again identified.  Mammographic images were processed with CAD.  On physical exam,  thickening is identified at the 12 o'clock position of the right breast 4 cm from the nipple. A firm palpable mass identified at the 3 o'clock position of the right breast 4 cm from the nipple.  Ultrasound is performed, showing no evidence of solid or cystic mass, distortion or abnormal areas of shadowing at the 12 o'clock position of the right breast. A 7 x 6 x 17 mm slightly irregular oval hypoechoic mass with calcifications is horizontally oriented at the 3 o'clock position of the right breast 4 cm from the nipple.  IMPRESSION: Indeterminate 7 x 6 x  17 mm hypoechoic mass at the 3 o'clock position of the right breast.  Tissue sampling is recommended.  No mammographic evidence of breast malignancy bilaterally.  No mammographic, palpable or sonographic abnormality at the 12 o'clock position of the right breast, in the area of palpable concern.  BI-RADS CATEGORY 4:  Suspicious abnormality - biopsy should be considered.  RECOMMENDATION:  Ultrasound guided right breast biopsy, which will be performed today but dictated in a separate report.  I have discussed the findings and recommendations with the patient. Results were also provided in writing at the conclusion of the visit.  If applicable, a reminder letter will be sent to the patient regarding the next appointment.   Original Report Authenticated By: Harmon Pier, M.D.   Ct Abdomen Pelvis W Contrast  07/03/2012   *RADIOLOGY REPORT*  Clinical Data:  Breast cancer.  No therapy yet.  Lower inner quadrant of right breast.  CT CHEST, ABDOMEN AND PELVIS WITH CONTRAST  Technique: Contiguous axial images of the chest abdomen and pelvis were obtained after IV contrast administration.  Contrast: 80 ml Omnipaque-300  Comparison: Breast MR of 06/26/2012.  No prior CTs.  CT CHEST  Findings: Lung windows demonstrate minimal left lower lobe subpleural nodularity at 3 mm on image 40/series 4.  Right lung clear.  Soft tissue windows demonstrate small bilateral axillary nodes,  without adenopathy.  No subpectoral adenopathy. No supraclavicular adenopathy.  Normal heart size, without pericardial effusion.  No central pulmonary embolism, on this non-dedicated study.  No mediastinal or hilar adenopathy.  No internal mammary adenopathy. Minimal residual thymic tissue in the anterior mediastinum (image 17/series 2).  IMPRESSION:  1. No acute process or evidence of metastatic disease in the chest. 2.  Probable subpleural lymph node in the left lower lobe. Recommend attention on follow-up.  CT ABDOMEN AND PELVIS  Findings:  Mild early phase of hepatic enhancement which decreases sensitivity for focal liver lesions.  None identified.  Normal spleen, stomach, pancreas, gallbladder, biliary tract, adrenal glands, kidneys. No retroperitoneal or retrocrural adenopathy.  Normal colon, appendix, and terminal ileum.  Normal small bowel without abdominal ascites.  No pelvic adenopathy.    Normal urinary bladder and uterus.  No adnexal mass.  Trace free pelvic fluid is likely physiologic.  Transitional right-sided lumbosacral anatomy.  Degenerative disc disease at the lumbosacral junction.  IMPRESSION:  No acute process or evidence of metastatic disease in the abdomen or pelvis.   Original Report Authenticated By: Jeronimo Greaves, M.D.   Mr Breast Right W Wo Contrast  07/12/2012   *RADIOLOGY REPORT*  Clinical Data:  Known invasive mammary carcinoma in the right breast at 3 o'clock posteriorly.  Additional site of abnormal enhancement in the right upper inner quadrant closer to the nipple for possible biopsy.  ATTEMPTED MRI GUIDED VACUUM ASSISTED BIOPSY OF THE RIGHT BREAST WITHOUT AND WITH CONTRAST  Comparison: Previous exams.  Technique: Multiplanar, multisequence MR images of the right breast were obtained prior to and following the intravenous administration of 9 ml of Mulithance.  I met with the patient, and we discussed the procedure of MRI guided biopsy, including risks, benefits, and alternatives.  Specifically, we discussed the risks of infection, bleeding, tissue injury, clip migration, and inadequate sampling.  Informed, written consent was given.  Using sterile technique, 2% Lidocaine, MRI guidance, and a 9 gauge vacuum assisted device, biopsy was attempted using a mediolateral approach.  However, the procedure was unsuccessful due to the extremely thin breast compression.  IMPRESSION:  Unsuccessful MRI guided  vacuum-assisted biopsy of the right breast.  Patient was sent to the [facility] for further evaluation with ultrasound.   Original Report Authenticated By: Cain Saupe, M.D.   Mr Breast Bilateral W Wo Contrast  06/26/2012   *RADIOLOGY REPORT*  Clinical Data: History of new diagnosis of breast cancer from recent ultrasound guided core needle biopsy right breast 06/20/2012 demonstrating invasive ductal carcinoma with DCIS.  GFR 89, creatinine 0.7 on 06/06/2012.  BILATERAL BREAST MRI WITH AND WITHOUT CONTRAST  Technique: Multiplanar, multisequence MR images of both breasts were obtained prior to and following the intravenous administration of 10ml of multihance.  Three dimensional images were evaluated at the independent DynaCad workstation.  Comparison:  Recent mammograms and ultrasound.  Findings: Examination demonstrates mild background parenchymal enhancement.  Right breast:  There is evidence of an ovoid gently lobulated and somewhat ill-defined heterogeneously enhancing mass over the deep third of the inner midportion of the right breast representing patient's biopsy-proven malignancy.  This mass contains central clip artifact and measures approximately 0.9 x 1.9 x 1.0 cm. The anterior border of this mass is located 5.5 cm from the nipple. There is a 6 mm slightly ill-defined enhancing ovoid mass 6 mm posterior lateral to the biopsy-proven malignancy lying just superficial to the pectoralis muscle.  There is also clumped non mass enhancement anterior and slightly superior to the biopsy-  proven malignancy in the middle third at approximately the 2 o'clock position.  This measures approximately 0.8 x 1.6 x 1.2 cm and is located 3 cm from the nipple.  This is suspicious for multifocal disease.  No suspicious axillary or internal mammary lymph nodes.  Left breast:  No suspicious mass, enhancement or adenopathy.  IMPRESSION: Known biopsy-proven malignancy over the deep third of the inner mid right breast.  6 mm ill-defined enhancing ovoid mass 6 mm posterior lateral to the biopsy-proven malignancy and lying just superficial to the pectoralis muscle.  Clumped non mass enhancement anterior and superior to patient's known malignancy at approximately the 2 o'clock position measuring 0.8 x 1.6 x 2.2 cm.  Findings likely represent multi focal disease.  RECOMMENDATION: Recommend second look ultrasound and core biopsy if feasible of the clumped non mass enhancement at approximately the 2 o'clock position right breast anterior/superior to the known malignancy. If not seen by ultrasound, recommend proceeding to MR guided biopsy of this suspicious abnormality.  THREE-DIMENSIONAL MR IMAGE RENDERING ON INDEPENDENT WORKSTATION:  Three-dimensional MR images were rendered by post-processing of the original MR data on an independent workstation.  The three- dimensional MR images were interpreted, and findings were reported in the accompanying complete MRI report for this study.  BI-RADS CATEGORY 0:  Incomplete.  Need additional imaging evaluation and/or prior mammograms for comparison.   Original Report Authenticated By: Elberta Fortis, M.D.   Dg Chest Port 1 View  07/06/2012   *RADIOLOGY REPORT*  Clinical Data: Post left-sided port catheter insertion  PORTABLE CHEST - 1 VIEW  Comparison: 07/04/2012  Findings:  Grossly unchanged cardiac silhouette and mediastinal contours. Interval placement of a left anterior chest wall subclavian vein approach port-a-catheter with tip projecting over the superior cavoatrial junction.  Grossly unchanged symmetric biapical pleural parenchymal thickening.  No pneumothorax.  No focal airspace opacities.  No pleural effusion.  No evidence of edema.  Unchanged bones.  IMPRESSION: Interval placement of a left anterior chest wall subclavian vein approach port-a-catheter with tip overlying the superior cavoatrial junction.  No pneumothorax.   Original Report Authenticated By: Tacey Ruiz, MD  Mm Digital Diagnostic Bilat  06/20/2012   *RADIOLOGY REPORT*  Clinical Data:  52 year old female for annual bilateral mammograms and palpable mass in the upper and inner right breast discovered on clinical examination.  DIGITAL DIAGNOSTIC BILATERAL MAMMOGRAM WITH CAD AND RIGHT BREAST ULTRASOUND:  Comparison:  03/21/2009 and prior mammograms dating back to 01/21/2005  Findings:  ACR Breast Density Category 4: The breast tissue is extremely dense.  Routine and spot compression views of the right breast demonstrate no evidence of suspicious mass or distortion. Scattered punctate calcifications within the breasts bilaterally are again identified.  Mammographic images were processed with CAD.  On physical exam, thickening is identified at the 12 o'clock position of the right breast 4 cm from the nipple. A firm palpable mass identified at the 3 o'clock position of the right breast 4 cm from the nipple.  Ultrasound is performed, showing no evidence of solid or cystic mass, distortion or abnormal areas of shadowing at the 12 o'clock position of the right breast. A 7 x 6 x 17 mm slightly irregular oval hypoechoic mass with calcifications is horizontally oriented at the 3 o'clock position of the right breast 4 cm from the nipple.  IMPRESSION: Indeterminate 7 x 6 x 17 mm hypoechoic mass at the 3 o'clock position of the right breast.  Tissue sampling is recommended.  No mammographic evidence of breast malignancy bilaterally.  No mammographic, palpable or sonographic abnormality at the 12 o'clock position of the right  breast, in the area of palpable concern.  BI-RADS CATEGORY 4:  Suspicious abnormality - biopsy should be considered.  RECOMMENDATION:  Ultrasound guided right breast biopsy, which will be performed today but dictated in a separate report.  I have discussed the findings and recommendations with the patient. Results were also provided in writing at the conclusion of the visit.  If applicable, a reminder letter will be sent to the patient regarding the next appointment.   Original Report Authenticated By: Harmon Pier, M.D.   Mm Digital Diagnostic Unilat R  07/12/2012   *RADIOLOGY REPORT*  Clinical Data:  Ultrasound-guided core needle biopsy of a hypoechoic area at 2 o'clock 2 cm from the right nipple with clip placement.  Known invasive mammary carcinoma in the right breast. Unsuccessful right breast MRI guided biopsy earlier today.  DIGITAL DIAGNOSTIC RIGHT MAMMOGRAM  Comparison:  Previous exams.  Findings:  Films are performed following ultrasound guided biopsy of a small hypoechoic area at 2 o'clock 2 cm from the right nipple. The ribbon clip is positioned within the area of concern. The ribbon clip is approximately 2.6 cm anterior to the top hat clip from the original biopsy on 06/20/2012.  IMPRESSION: Appropriate clip placement following ultrasound-guided core needle biopsy of a hypoechoic area at 2 o'clock 2 cm in the right nipple.   Original Report Authenticated By: Cain Saupe, M.D.   Mm Digital Diagnostic Unilat R  06/20/2012   *RADIOLOGY REPORT*  Clinical Data:  52 year old female - evaluate clip placement following ultrasound guided right breast biopsy.  DIGITAL DIAGNOSTIC RIGHT MAMMOGRAM  Comparison:  Previous exams.  Findings:  Films are performed following ultrasound guided biopsy of the 7 x 6 x 17 mm hypoechoic mass at the 3 o'clock position of the right breast 4 cm from the nipple. The T shaped biopsy clip is in satisfactory position.  IMPRESSION: Satisfactory clip placement following  ultrasound guided right breast biopsy.  Pathology will be followed.   Original Report Authenticated By: Harmon Pier, M.D.   Dg Fluoro Guide  Cv Line-no Report  07/06/2012   CLINICAL DATA: port placement   FLOURO GUIDE CV LINE  Fluoroscopy was utilized by the requesting physician.  No radiographic  interpretation.    [place] Rt Breast Bx W Loc Dev 1st Lesion Img Bx Spec US Guide  07/13/2012   **ADDENDUM** CREATED: 07/13/2012 14:11:45  I spoke with the patient by telephone on 07/13/2012 to discuss pathology results.  Pathology demonstrates  pseudoangiomatous stromal hyperplasia.  The findings were called to the office of Dr. Donell Beers. Although the imaging findings could be related to pseudoangiomatous stromal hyperplasia, excision of the anterior biopsy site is suggested at the time of patient's definitive surgery.  The patient is considering unilateral or bilateral mastectomy at this time.  The patient reports no problems at the biopsy site.  All questions were answered.  Recommendations:  Excision of the biopsy site. The patient is scheduled to begin chemotherapy in 4 days.  **END ADDENDUM** SIGNED BY: Blair Hailey. Manson Passey, M.D.  07/12/2012   *RADIOLOGY REPORT*  Clinical Data:  Unsuccessful MRI guided biopsy of the area of abnormal enhancement in the left anterior upper inner quadrant, anterior to the known invasive mammary carcinoma.  Second look ultrasound performed and a 5 x 4 x 4 mm hypoechoic area found at 2 o'clock 2 cm from the right nipple which will be biopsied.  ULTRASOUND GUIDED VACUUM ASSISTED CORE BIOPSY OF THE RIGHT BREAST  Sonography of the right upper inner quadrant was performed.  There is an ill-defined hypoechoic 5 x 4 x 4 mm area at 2 o'clock 2 cm from the right nipple which will be biopsied with ultrasound guidance.  The patient and I discussed the procedure of ultrasound-guided biopsy, including benefits and alternatives.  We discussed the high likelihood of a successful procedure. We discussed the  risks of the procedure including infection, bleeding, tissue injury, clip migration, and inadequate sampling.  Written informed consent was given.  Using sterile technique, 2% lidocaine, ultrasound guidance, and a 12 gauge vacuum assisted needle, biopsy was performed of the hypoechoic area at 2 o'clock 2 cm from the right nipple using a caudocranial approach.  At the conclusion of the procedure, a within tissue marker clip was deployed into the biopsy cavity. Follow-up 2-view mammogram was performed and dictated separately.  IMPRESSION: Ultrasound-guided biopsy of a 5 x 4 x 4 mm hypoechoic area at 2 o'clock 2 cm from the right nipple.  No apparent complications.   Original Report Authenticated By: Cain Saupe, M.D.   [place] Rt Breast Bx W Loc Dev 1st Lesion Img Bx Spec US Guide  06/21/2012   *RADIOLOGY REPORT*  Clinical Data:  Indeterminate 7 x 6 x 17 mm hypoechoic mass in the inner right breast - for tissue sampling.  ULTRASOUND GUIDED VACUUM ASSISTED CORE BIOPSY OF THE RIGHT BREAST  Comparison: Previous exams.  I met with the patient and we discussed the procedure of ultrasound- guided biopsy, including benefits and alternatives.  We discussed the high likelihood of a successful procedure. We discussed the risks of the procedure including infection, bleeding, tissue injury, clip migration, and inadequate sampling.  Informed written consent was given.  Using sterile technique, 2% lidocaine ultrasound guidance and a 12 gauge vacuum assisted needle biopsy was performed of the 7 x 6 x 17 mm hypoechoic mass at the 3 o'clock position of the right breast 4 cm from the nipple using a lateral approach.  At the conclusion of the procedure, a T shaped tissue marker clip was deployed into the  biopsy cavity.  Follow-up 2-view mammogram was performed and dictated separately.  IMPRESSION: Ultrasound-guided biopsy of right breast mass.  No apparent complications.  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 Histology correlates with imaging findings.  The patient was contacted by phone on 06/21/2012 and these results given to her which she understood. Her questions were answered. She was encouraged to obtain breast cancer educational material from [facility] at her convenience. The patient had no complaints with her biopsy site.  Recommend surgery/oncology consultation.  An appointment at [facility] has been scheduled for 06/28/2012. Recommend bilateral breast MRI, which has been scheduled for 06/26/2012. The patient was informed of these appointments.   Original Report Authenticated By: Harmon Pier, M.D.    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 Patient had other areas seen on MRI biopsy of the second mass was negative. Patient has had her Port-A-Cath placed. She is proceeding neoadjuvant chemotherapy to possibly conserve her breast. Chemotherapy will consist of Adriamycin Cytoxan given dose dense for 4 cycles starting today 07/17/2012. She will receive Neulasta support on day 2 for each cycle. When she completes this then she will begin weekly Taxol and carboplatinum for a total of 12 weeks.  #2 we discussed risks benefits and side effects of chemotherapy. She has had an echocardiogram as well as chemotherapy teaching class. Port-A-Cath was placed by Dr. Donell Beers. The port is functioning well.  #3 Taxol/Carbo cycle 5 today  #4 vaginal herpes outbreak  #5 neuropathy    PLAN:  #1 patient will proceed with cycle 5 taxol/carbo today.    #2We will repeat urinalysis and urine culture today.    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  #4 Prescribed a Zpak for upper respiratory infection.  Should the patient worsen, she will call us and we will get a chest x ray.   #5 patient will return in one week's  time for cycle 6 of Taxol/ carboplatinum.  All questions were answered. The patient knows to call the clinic with any problems, questions or concerns. We can certainly see the patient much sooner if necessary.  I spent 25 minutes counseling the patient face to face. The total time spent in the appointment was 30 minutes.  Cherie Ouch Lyn Hollingshead, NP Medical Oncology [facility] Phone: [phone]    10/10/2012, 3:07 PM

## 2012-10-09 NOTE — Patient Instructions (Signed)
Doing well.  Proceed with chemotherapy.  You will receive IV fluids with chemo today.  Call if you feel any worse.  Drink plenty of fluids!  Please call us if you have any questions or concerns.    Azithromycin tablets What is this medicine? AZITHROMYCIN (az ith roe MYE sin) is a macrolide antibiotic. It is used to treat or prevent certain kinds of bacterial infections. It will not work for colds, flu, or other viral infections. This medicine may be used for other purposes; ask your health care provider or pharmacist if you have questions. What should I tell my health care provider before I take this medicine? They need to know if you have any of these conditions: -kidney disease -liver disease -irregular heartbeat or heart disease -an unusual or allergic reaction to azithromycin, erythromycin, other macrolide antibiotics, foods, dyes, or preservatives -pregnant or trying to get pregnant -breast-feeding How should I use this medicine? Take this medicine by mouth with a full glass of water. Follow the directions on the prescription label. The tablets can be taken with food or on an empty stomach. If the medicine upsets your stomach, take it with food. Take your medicine at regular intervals. Do not take your medicine more often than directed. Take all of your medicine as directed even if you think your are better. Do not skip doses or stop your medicine early. Talk to your pediatrician regarding the use of this medicine in children. Special care may be needed. Overdosage: If you think you have taken too much of this medicine contact a poison control center or emergency room at once. NOTE: This medicine is only for you. Do not share this medicine with others. What if I miss a dose? If you miss a dose, take it as soon as you can. If it is almost time for your next dose, take only that dose. Do not take double or extra doses. What may interact with this medicine? Do not take this medicine with any  of the following medications: -lincomycin This medicine may also interact with the following medications: -amiodarone -antacids -cyclosporine -digoxin -dihydroergotamine or ergotamine -magnesium -nelfinavir -phenytoin -warfarin This list may not describe all possible interactions. Give your health care provider a list of all the medicines, herbs, non-prescription drugs, or dietary supplements you use. Also tell them if you smoke, drink alcohol, or use illegal drugs. Some items may interact with your medicine. What should I watch for while using this medicine? Tell your doctor or health care professional if your symptoms do not improve. Do not treat diarrhea with over the counter products. Contact your doctor if you have diarrhea that lasts more than 2 days or if it is severe and watery. This medicine can make you more sensitive to the sun. Keep out of the sun. If you cannot avoid being in the sun, wear protective clothing and use sunscreen. Do not use sun lamps or tanning beds/booths. What side effects may I notice from receiving this medicine? Side effects that you should report to your doctor or health care professional as soon as possible: -allergic reactions like skin rash, itching or hives, swelling of the face, lips, or tongue -confusion, nightmares or hallucinations -dark urine -difficulty breathing -hearing loss -irregular heartbeat or chest pain -pain or difficulty passing urine -redness, blistering, peeling or loosening of the skin, including inside the mouth -white patches or sores in the mouth -yellowing of the eyes or skin Side effects that usually do not require medical attention (report to  your doctor or health care professional if they continue or are bothersome): -diarrhea -dizziness, drowsiness -headache -stomach upset or vomiting -tooth discoloration -vaginal irritation This list may not describe all possible side effects. Call your doctor for medical advice  about side effects. You may report side effects to FDA at 1-800-FDA-1088. Where should I keep my medicine? Keep out of the reach of children. Store at room temperature between 15 and 30 degrees C (59 and 86 degrees F). Throw away any unused medicine after the expiration date. NOTE: This sheet is a summary. It may not cover all possible information. If you have questions about this medicine, talk to your doctor, pharmacist, or health care provider.  2012, [employer]. (04/04/2007 1:50:13 PM)

## 2012-10-11 LAB — URINE CULTURE

## 2012-10-16 ENCOUNTER — Telehealth: Payer: Self-pay | Admitting: *Deleted

## 2012-10-16 ENCOUNTER — Ambulatory Visit (HOSPITAL_BASED_OUTPATIENT_CLINIC_OR_DEPARTMENT_OTHER): Payer: Medicaid Other

## 2012-10-16 ENCOUNTER — Ambulatory Visit (HOSPITAL_BASED_OUTPATIENT_CLINIC_OR_DEPARTMENT_OTHER): Payer: Medicaid Other | Admitting: Adult Health

## 2012-10-16 ENCOUNTER — Other Ambulatory Visit (HOSPITAL_BASED_OUTPATIENT_CLINIC_OR_DEPARTMENT_OTHER): Payer: Medicaid Other | Admitting: Lab

## 2012-10-16 ENCOUNTER — Encounter: Payer: Self-pay | Admitting: Adult Health

## 2012-10-16 VITALS — BP 114/73 | HR 98 | Temp 98.3°F | Resp 20 | Ht 63.0 in | Wt 122.9 lb

## 2012-10-16 DIAGNOSIS — C50319 Malignant neoplasm of lower-inner quadrant of unspecified female breast: Secondary | ICD-10-CM

## 2012-10-16 DIAGNOSIS — C50311 Malignant neoplasm of lower-inner quadrant of right female breast: Secondary | ICD-10-CM

## 2012-10-16 DIAGNOSIS — Z5111 Encounter for antineoplastic chemotherapy: Secondary | ICD-10-CM

## 2012-10-16 DIAGNOSIS — C50919 Malignant neoplasm of unspecified site of unspecified female breast: Secondary | ICD-10-CM

## 2012-10-16 DIAGNOSIS — G622 Polyneuropathy due to other toxic agents: Secondary | ICD-10-CM

## 2012-10-16 DIAGNOSIS — Z171 Estrogen receptor negative status [ER-]: Secondary | ICD-10-CM

## 2012-10-16 DIAGNOSIS — G629 Polyneuropathy, unspecified: Secondary | ICD-10-CM

## 2012-10-16 DIAGNOSIS — I10 Essential (primary) hypertension: Secondary | ICD-10-CM

## 2012-10-16 LAB — CBC WITH DIFFERENTIAL/PLATELET
Eosinophils Absolute: 0 10*3/uL (ref 0.0–0.5)
MONO#: 0.2 10*3/uL (ref 0.1–0.9)
NEUT#: 1.8 10*3/uL (ref 1.5–6.5)
RBC: 2.72 10*6/uL — ABNORMAL LOW (ref 3.70–5.45)
RDW: 16.4 % — ABNORMAL HIGH (ref 11.2–14.5)
WBC: 2.5 10*3/uL — ABNORMAL LOW (ref 3.9–10.3)
nRBC: 0 % (ref 0–0)

## 2012-10-16 LAB — COMPREHENSIVE METABOLIC PANEL (CC13)
ALT: 16 U/L (ref 0–55)
CO2: 25 mEq/L (ref 22–29)
Calcium: 8.6 mg/dL (ref 8.4–10.4)
Chloride: 106 mEq/L (ref 98–109)
Sodium: 137 mEq/L (ref 136–145)
Total Bilirubin: 0.4 mg/dL (ref 0.20–1.20)
Total Protein: 6.2 g/dL — ABNORMAL LOW (ref 6.4–8.3)

## 2012-10-16 MED ORDER — SODIUM CHLORIDE 0.9 % IV SOLN
217.0000 mg | Freq: Once | INTRAVENOUS | Status: AC
  Administered 2012-10-16: 220 mg via INTRAVENOUS
  Filled 2012-10-16: qty 22

## 2012-10-16 MED ORDER — ONDANSETRON 16 MG/50ML IVPB (CHCC)
16.0000 mg | Freq: Once | INTRAVENOUS | Status: AC
  Administered 2012-10-16: 16 mg via INTRAVENOUS

## 2012-10-16 MED ORDER — PACLITAXEL CHEMO INJECTION 300 MG/50ML
80.0000 mg/m2 | Freq: Once | INTRAVENOUS | Status: AC
  Administered 2012-10-16: 126 mg via INTRAVENOUS
  Filled 2012-10-16: qty 21

## 2012-10-16 MED ORDER — FAMOTIDINE IN NACL 20-0.9 MG/50ML-% IV SOLN
INTRAVENOUS | Status: AC
  Filled 2012-10-16: qty 50

## 2012-10-16 MED ORDER — DEXAMETHASONE SODIUM PHOSPHATE 20 MG/5ML IJ SOLN
20.0000 mg | Freq: Once | INTRAMUSCULAR | Status: AC
  Administered 2012-10-16: 20 mg via INTRAVENOUS

## 2012-10-16 MED ORDER — DIPHENHYDRAMINE HCL 50 MG/ML IJ SOLN
INTRAMUSCULAR | Status: AC
  Filled 2012-10-16: qty 1

## 2012-10-16 MED ORDER — DEXAMETHASONE SODIUM PHOSPHATE 20 MG/5ML IJ SOLN
INTRAMUSCULAR | Status: AC
  Filled 2012-10-16: qty 5

## 2012-10-16 MED ORDER — SODIUM CHLORIDE 0.9 % IJ SOLN
10.0000 mL | INTRAMUSCULAR | Status: DC | PRN
  Administered 2012-10-16: 10 mL
  Filled 2012-10-16: qty 10

## 2012-10-16 MED ORDER — ONDANSETRON 16 MG/50ML IVPB (CHCC)
INTRAVENOUS | Status: AC
Start: 2012-10-16 — End: 2012-10-16
  Filled 2012-10-16: qty 16

## 2012-10-16 MED ORDER — HEPARIN SOD (PORK) LOCK FLUSH 100 UNIT/ML IV SOLN
500.0000 [IU] | Freq: Once | INTRAVENOUS | Status: AC | PRN
  Administered 2012-10-16: 500 [IU]
  Filled 2012-10-16: qty 5

## 2012-10-16 MED ORDER — GABAPENTIN 100 MG PO CAPS: 100.0000 mg | ORAL_CAPSULE | Freq: Three times a day (TID) | ORAL | Status: DC

## 2012-10-16 MED ORDER — DIPHENHYDRAMINE HCL 50 MG/ML IJ SOLN
25.0000 mg | Freq: Once | INTRAMUSCULAR | Status: AC
  Administered 2012-10-16: 25 mg via INTRAVENOUS

## 2012-10-16 MED ORDER — FAMOTIDINE IN NACL 20-0.9 MG/50ML-% IV SOLN
20.0000 mg | Freq: Once | INTRAVENOUS | Status: AC
  Administered 2012-10-16: 20 mg via INTRAVENOUS

## 2012-10-16 MED ORDER — SODIUM CHLORIDE 0.9 % IV SOLN
Freq: Once | INTRAVENOUS | Status: AC
  Administered 2012-10-16: 11:00:00 via INTRAVENOUS

## 2012-10-16 NOTE — Patient Instructions (Addendum)
Prochlorperazine tablets What is this medicine? PROCHLORPERAZINE (proe klor PER a zeen) helps to control severe nausea and vomiting. This medicine is also used to treat schizophrenia. It can also help patients who experience anxiety that is not due to psychological illness. This medicine may be used for other purposes; ask your health care provider or pharmacist if you have questions. What should I tell my health care provider before I take this medicine? They need to know if you have any of these conditions: -blood disorders or disease -dementia -liver disease or jaundice -Parkinson's disease -uncontrollable movement disorder -an unusual or allergic reaction to prochlorperazine, other medicines, foods, dyes, or preservatives -pregnant or trying to get pregnant -breast-feeding How should I use this medicine? Take this medicine by mouth with a glass of water. Follow the directions on the prescription label. Take your doses at regular intervals. Do not take your medicine more often than directed. Do not stop taking this medicine suddenly. This can cause nausea, vomiting, and dizziness. Ask your doctor or health care professional for advice. Talk to your pediatrician regarding the use of this medicine in children. Special care may be needed. While this drug may be prescribed for children as young as 2 years for selected conditions, precautions do apply. Overdosage: If you think you have taken too much of this medicine contact a poison control center or emergency room at once. NOTE: This medicine is only for you. Do not share this medicine with others. What if I miss a dose? If you miss a dose, take it as soon as you can. If it is almost time for your next dose, take only that dose. Do not take double or extra doses. What may interact with this medicine? Do not take this medicine with any of the following medications: -amoxapine -antidepressants like citalopram, escitalopram, fluoxetine,  paroxetine, and sertraline -deferoxamine -dofetilide -maprotiline -tricyclic antidepressants like amitriptyline, clomipramine, imipramine, nortiptyline and others This medicine may also interact with the following medications: -lithium -medicines for pain -phenytoin -propranolol -warfarin This list may not describe all possible interactions. Give your health care provider a list of all the medicines, herbs, non-prescription drugs, or dietary supplements you use. Also tell them if you smoke, drink alcohol, or use illegal drugs. Some items may interact with your medicine. What should I watch for while using this medicine? Visit your doctor or health care professional for regular checks on your progress. You may get drowsy or dizzy. Do not drive, use machinery, or do anything that needs mental alertness until you know how this medicine affects you. Do not stand or sit up quickly, especially if you are an older patient. This reduces the risk of dizzy or fainting spells. Alcohol may interfere with the effect of this medicine. Avoid alcoholic drinks. This medicine can reduce the response of your body to heat or cold. Dress warm in cold weather and stay hydrated in hot weather. If possible, avoid extreme temperatures like saunas, hot tubs, very hot or cold showers, or activities that can cause dehydration such as vigorous exercise. This medicine can make you more sensitive to the sun. Keep out of the sun. If you cannot avoid being in the sun, wear protective clothing and use sunscreen. Do not use sun lamps or tanning beds/booths. Your mouth may get dry. Chewing sugarless gum or sucking hard candy, and drinking plenty of water may help. Contact your doctor if the problem does not go away or is severe. What side effects may I notice from receiving this  medicine? Side effects that you should report to your doctor or health care professional as soon as possible: -blurred vision -breast enlargement in men  or women -breast milk in women who are not breast-feeding -chest pain, fast or irregular heartbeat -confusion, restlessness -dark yellow or brown urine -difficulty breathing or swallowing -dizziness or fainting spells -drooling, shaking, movement difficulty (shuffling walk) or rigidity -fever, chills, sore throat -involuntary or uncontrollable movements of the eyes, mouth, head, arms, and legs -seizures -stomach area pain -unusually weak or tired -unusual bleeding or bruising -yellowing of skin or eyes Side effects that usually do not require medical attention (report to your doctor or health care professional if they continue or are bothersome): -difficulty passing urine -difficulty sleeping -headache -sexual dysfunction -skin rash, or itching This list may not describe all possible side effects. Call your doctor for medical advice about side effects. You may report side effects to FDA at 1-800-FDA-1088. Where should I keep my medicine? Keep out of the reach of children. Store at room temperature between 15 and 30 degrees C (59 and 86 degrees F). Protect from light. Throw away any unused medicine after the expiration date. NOTE: This sheet is a summary. It may not cover all possible information. If you have questions about this medicine, talk to your doctor, pharmacist, or health care provider.  2013, [employer]. (06/01/2011 4:59:39 PM) Hydrochlorothiazide, HCTZ capsules or tablets What is this medicine? HYDROCHLOROTHIAZIDE (hye droe klor oh THYE a zide) is a diuretic. It increases the amount of urine passed, which causes the body to lose salt and water. This medicine is used to treat high blood pressure. It is also reduces the swelling and water retention caused by various medical conditions, such as heart, liver, or kidney disease. This medicine may be used for other purposes; ask your health care provider or pharmacist if you have questions. What should I tell my health  care provider before I take this medicine? They need to know if you have any of these conditions: -diabetes -gout -immune system problems, like lupus -kidney disease or kidney stones -liver disease -pancreatitis -small amount of urine or difficulty passing urine -an unusual or allergic reaction to hydrochlorothiazide, sulfa drugs, other medicines, foods, dyes, or preservatives -pregnant or trying to get pregnant -breast-feeding How should I use this medicine? Take this medicine by mouth with a glass of water. Follow the directions on the prescription label. Take your medicine at regular intervals. Remember that you will need to pass urine frequently after taking this medicine. Do not take your doses at a time of day that will cause you problems. Do not stop taking your medicine unless your doctor tells you to. Talk to your pediatrician regarding the use of this medicine in children. Special care may be needed. Overdosage: If you think you have taken too much of this medicine contact a poison control center or emergency room at once. NOTE: This medicine is only for you. Do not share this medicine with others. What if I miss a dose? If you miss a dose, take it as soon as you can. If it is almost time for your next dose, take only that dose. Do not take double or extra doses. What may interact with this medicine? -cholestyramine -colestipol -digoxin -dofetilide -lithium -medicines for blood pressure -medicines for diabetes -medicines that relax muscles for surgery -other diuretics -steroid medicines like prednisone or cortisone This list may not describe all possible interactions. Give your health care provider a list of all the  medicines, herbs, non-prescription drugs, or dietary supplements you use. Also tell them if you smoke, drink alcohol, or use illegal drugs. Some items may interact with your medicine. What should I watch for while using this medicine? Visit your doctor or health  care professional for regular checks on your progress. Check your blood pressure as directed. Ask your doctor or health care professional what your blood pressure should be and when you should contact him or her. You may need to be on a special diet while taking this medicine. Ask your doctor. Check with your doctor or health care professional if you get an attack of severe diarrhea, nausea and vomiting, or if you sweat a lot. The loss of too much body fluid can make it dangerous for you to take this medicine. You may get drowsy or dizzy. Do not drive, use machinery, or do anything that needs mental alertness until you know how this medicine affects you. Do not stand or sit up quickly, especially if you are an older patient. This reduces the risk of dizzy or fainting spells. Alcohol may interfere with the effect of this medicine. Avoid alcoholic drinks. This medicine may affect your blood sugar level. If you have diabetes, check with your doctor or health care professional before changing the dose of your diabetic medicine. This medicine can make you more sensitive to the sun. Keep out of the sun. If you cannot avoid being in the sun, wear protective clothing and use sunscreen. Do not use sun lamps or tanning beds/booths. What side effects may I notice from receiving this medicine? Side effects that you should report to your doctor or health care professional as soon as possible: -allergic reactions such as skin rash or itching, hives, swelling of the lips, mouth, tongue, or throat -changes in vision -chest pain -eye pain -fast or irregular heartbeat -feeling faint or lightheaded, falls -gout attack -muscle pain or cramps -pain or difficulty when passing urine -pain, tingling, numbness in the hands or feet -redness, blistering, peeling or loosening of the skin, including inside the mouth -unusually weak or tired Side effects that usually do not require medical attention (report to your doctor or  health care professional if they continue or are bothersome): -change in sex drive or performance -dry mouth -headache -stomach upset This list may not describe all possible side effects. Call your doctor for medical advice about side effects. You may report side effects to FDA at 1-800-FDA-1088. Where should I keep my medicine? Keep out of the reach of children. Store at room temperature between 15 and 30 degrees C (59 and 86 degrees F). Do not freeze. Protect from light and moisture. Keep container closed tightly. Throw away any unused medicine after the expiration date. NOTE: This sheet is a summary. It may not cover all possible information. If you have questions about this medicine, talk to your doctor, pharmacist, or health care provider.  2012, [employer]. (09/05/2009 12:57:37 PM)Valacyclovir caplets What is this medicine? VALACYCLOVIR (val ay SYE kloe veer) is an antiviral medicine. It is used to treat or prevent infections caused by certain kinds of viruses. Examples of these infections include herpes and shingles. This medicine will not cure herpes. This medicine may be used for other purposes; ask your health care provider or pharmacist if you have questions. What should I tell my health care provider before I take this medicine? They need to know if you have any of these conditions: -acquired immunodeficiency syndrome (AIDS) -any other condition that may  weaken the immune system -bone marrow or kidney transplant -kidney disease -an unusual or allergic reaction to valacyclovir, acyclovir, ganciclovir, valganciclovir, other medicines, foods, dyes, or preservatives -pregnant or trying to get pregnant -breast-feeding How should I use this medicine? Take this medicine by mouth with a glass of water. Follow the directions on the prescription label. You can take this medicine with or without food. Take your doses at regular intervals. Do not take your medicine more often than  directed. Finish the full course prescribed by your doctor or health care professional even if you think your condition is better. Do not stop taking except on the advice of your doctor or health care professional. Talk to your pediatrician regarding the use of this medicine in children. While this drug may be prescribed for children as young as 2 years for selected conditions, precautions do apply. Overdosage: If you think you have taken too much of this medicine contact a poison control center or emergency room at once. NOTE: This medicine is only for you. Do not share this medicine with others. What if I miss a dose? If you miss a dose, take it as soon as you can. If it is almost time for your next dose, take only that dose. Do not take double or extra doses. What may interact with this medicine? -cimetidine -probenecid This list may not describe all possible interactions. Give your health care provider a list of all the medicines, herbs, non-prescription drugs, or dietary supplements you use. Also tell them if you smoke, drink alcohol, or use illegal drugs. Some items may interact with your medicine. What should I watch for while using this medicine? Tell your doctor or health care professional if your symptoms do not start to get better after 1 week. This medicine works best when taken early in the course of an infection, within the first 72 hours. Begin treatment as soon as possible after the first signs of infection like tingling, itching, or pain in the affected area. It is possible that genital herpes may still be spread even when you are not having symptoms. Always use safer sex practices like condoms made of latex or polyurethane whenever you have sexual contact. You should stay well hydrated while taking this medicine. Drink plenty of fluids. What side effects may I notice from receiving this medicine? Side effects that you should report to your doctor or health care professional as soon  as possible: -allergic reactions like skin rash, itching or hives, swelling of the face, lips, or tongue -aggressive behavior -confusion -hallucinations -problems with balance, talking, walking -stomach pain -tremor -trouble passing urine or change in the amount of urine Side effects that usually do not require medical attention (report to your doctor or health care professional if they continue or are bothersome): -dizziness -headache -nausea, vomiting This list may not describe all possible side effects. Call your doctor for medical advice about side effects. You may report side effects to FDA at 1-800-FDA-1088. Where should I keep my medicine? Keep out of the reach of children. Store at room temperature between 15 and 25 degrees C (59 and 77 degrees F). Keep container tightly closed. Throw away any unused medicine after the expiration date. NOTE: This sheet is a summary. It may not cover all possible information. If you have questions about this medicine, talk to your doctor, pharmacist, or health care provider.  2013, [employer]. (03/20/2007 6:10:08 PM) Prochlorperazine tablets What is this medicine? PROCHLORPERAZINE (proe klor PER a zeen) helps to  control severe nausea and vomiting. This medicine is also used to treat schizophrenia. It can also help patients who experience anxiety that is not due to psychological illness. This medicine may be used for other purposes; ask your health care provider or pharmacist if you have questions. What should I tell my health care provider before I take this medicine? They need to know if you have any of these conditions: -blood disorders or disease -dementia -liver disease or jaundice -Parkinson's disease -uncontrollable movement disorder -an unusual or allergic reaction to prochlorperazine, other medicines, foods, dyes, or preservatives -pregnant or trying to get pregnant -breast-feeding How should I use this medicine? Take this  medicine by mouth with a glass of water. Follow the directions on the prescription label. Take your doses at regular intervals. Do not take your medicine more often than directed. Do not stop taking this medicine suddenly. This can cause nausea, vomiting, and dizziness. Ask your doctor or health care professional for advice. Talk to your pediatrician regarding the use of this medicine in children. Special care may be needed. While this drug may be prescribed for children as young as 2 years for selected conditions, precautions do apply. Overdosage: If you think you have taken too much of this medicine contact a poison control center or emergency room at once. NOTE: This medicine is only for you. Do not share this medicine with others. What if I miss a dose? If you miss a dose, take it as soon as you can. If it is almost time for your next dose, take only that dose. Do not take double or extra doses. What may interact with this medicine? Do not take this medicine with any of the following medications: -amoxapine -antidepressants like citalopram, escitalopram, fluoxetine, paroxetine, and sertraline -deferoxamine -dofetilide -maprotiline -tricyclic antidepressants like amitriptyline, clomipramine, imipramine, nortiptyline and others This medicine may also interact with the following medications: -lithium -medicines for pain -phenytoin -propranolol -warfarin This list may not describe all possible interactions. Give your health care provider a list of all the medicines, herbs, non-prescription drugs, or dietary supplements you use. Also tell them if you smoke, drink alcohol, or use illegal drugs. Some items may interact with your medicine. What should I watch for while using this medicine? Visit your doctor or health care professional for regular checks on your progress. You may get drowsy or dizzy. Do not drive, use machinery, or do anything that needs mental alertness until you know how this  medicine affects you. Do not stand or sit up quickly, especially if you are an older patient. This reduces the risk of dizzy or fainting spells. Alcohol may interfere with the effect of this medicine. Avoid alcoholic drinks. This medicine can reduce the response of your body to heat or cold. Dress warm in cold weather and stay hydrated in hot weather. If possible, avoid extreme temperatures like saunas, hot tubs, very hot or cold showers, or activities that can cause dehydration such as vigorous exercise. This medicine can make you more sensitive to the sun. Keep out of the sun. If you cannot avoid being in the sun, wear protective clothing and use sunscreen. Do not use sun lamps or tanning beds/booths. Your mouth may get dry. Chewing sugarless gum or sucking hard candy, and drinking plenty of water may help. Contact your doctor if the problem does not go away or is severe. What side effects may I notice from receiving this medicine? Side effects that you should report to your doctor or health care  professional as soon as possible: -blurred vision -breast enlargement in men or women -breast milk in women who are not breast-feeding -chest pain, fast or irregular heartbeat -confusion, restlessness -dark yellow or brown urine -difficulty breathing or swallowing -dizziness or fainting spells -drooling, shaking, movement difficulty (shuffling walk) or rigidity -fever, chills, sore throat -involuntary or uncontrollable movements of the eyes, mouth, head, arms, and legs -seizures -stomach area pain -unusually weak or tired -unusual bleeding or bruising -yellowing of skin or eyes Side effects that usually do not require medical attention (report to your doctor or health care professional if they continue or are bothersome): -difficulty passing urine -difficulty sleeping -headache -sexual dysfunction -skin rash, or itching This list may not describe all possible side effects. Call your doctor for  medical advice about side effects. You may report side effects to FDA at 1-800-FDA-1088. Where should I keep my medicine? Keep out of the reach of children. Store at room temperature between 15 and 30 degrees C (59 and 86 degrees F). Protect from light. Throw away any unused medicine after the expiration date. NOTE: This sheet is a summary. It may not cover all possible information. If you have questions about this medicine, talk to your doctor, pharmacist, or health care provider.  2013, [employer]. (06/01/2011 4:59:39 PM) Acetaminophen; Oxycodone tablets What is this medicine? ACETAMINOPHEN; OXYCODONE (a set a MEE noe fen; ox i KOE done) is a pain reliever. It is used to treat mild to moderate pain. This medicine may be used for other purposes; ask your health care provider or pharmacist if you have questions. What should I tell my health care provider before I take this medicine? They need to know if you have any of these conditions: -brain tumor -Crohn's disease, inflammatory bowel disease, or ulcerative colitis -drink more than 3 alcohol containing drinks per day -drug abuse or addiction -head injury -heart or circulation problems -kidney disease or problems going to the bathroom -liver disease -lung disease, asthma, or breathing problems -an unusual or allergic reaction to acetaminophen, oxycodone, other opioid analgesics, other medicines, foods, dyes, or preservatives -pregnant or trying to get pregnant -breast-feeding How should I use this medicine? Take this medicine by mouth with a full glass of water. Follow the directions on the prescription label. Take your medicine at regular intervals. Do not take your medicine more often than directed. Talk to your pediatrician regarding the use of this medicine in children. Special care may be needed. Patients over 61 years old may have a stronger reaction and need a smaller dose. Overdosage: If you think you have taken too  much of this medicine contact a poison control center or emergency room at once. NOTE: This medicine is only for you. Do not share this medicine with others. What if I miss a dose? If you miss a dose, take it as soon as you can. If it is almost time for your next dose, take only that dose. Do not take double or extra doses. What may interact with this medicine? -alcohol -antihistamines -barbiturates like amobarbital, butalbital, butabarbital, methohexital, pentobarbital, phenobarbital, thiopental, and secobarbital -benztropine -drugs for bladder problems like solifenacin, trospium, oxybutynin, tolterodine, hyoscyamine, and methscopolamine -drugs for breathing problems like ipratropium and tiotropium -drugs for certain stomach or intestine problems like propantheline, homatropine methylbromide, glycopyrrolate, atropine, belladonna, and dicyclomine -general anesthetics like etomidate, ketamine, nitrous oxide, propofol, desflurane, enflurane, halothane, isoflurane, and sevoflurane -medicines for depression, anxiety, or psychotic disturbances -medicines for sleep -muscle relaxants -naltrexone -narcotic medicines (opiates) for pain -  phenothiazines like perphenazine, thioridazine, chlorpromazine, mesoridazine, fluphenazine, prochlorperazine, promazine, and trifluoperazine -scopolamine -tramadol -trihexyphenidyl This list may not describe all possible interactions. Give your health care provider a list of all the medicines, herbs, non-prescription drugs, or dietary supplements you use. Also tell them if you smoke, drink alcohol, or use illegal drugs. Some items may interact with your medicine. What should I watch for while using this medicine? Tell your doctor or health care professional if your pain does not go away, if it gets worse, or if you have new or a different type of pain. You may develop tolerance to the medicine. Tolerance means that you will need a higher dose of the medication for pain  relief. Tolerance is normal and is expected if you take this medicine for a long time. Do not suddenly stop taking your medicine because you may develop a severe reaction. Your body becomes used to the medicine. This does NOT mean you are addicted. Addiction is a behavior related to getting and using a drug for a non-medical reason. If you have pain, you have a medical reason to take pain medicine. Your doctor will tell you how much medicine to take. If your doctor wants you to stop the medicine, the dose will be slowly lowered over time to avoid any side effects. You may get drowsy or dizzy. Do not drive, use machinery, or do anything that needs mental alertness until you know how this medicine affects you. Do not stand or sit up quickly, especially if you are an older patient. This reduces the risk of dizzy or fainting spells. Alcohol may interfere with the effect of this medicine. Avoid alcoholic drinks. There are different types of narcotic medicines (opiates) for pain. If you take more than one type at the same time, you may have more side effects. Give your health care provider a list of all medicines you use. Your doctor will tell you how much medicine to take. Do not take more medicine than directed. Call emergency for help if you have problems breathing. The medicine will cause constipation. Try to have a bowel movement at least every 2 to 3 days. If you do not have a bowel movement for 3 days, call your doctor or health care professional. Do not take Tylenol (acetaminophen) or medicines that have acetaminophen with this medicine. Too much acetaminophen can be very dangerous. Many nonprescription medicines contain acetaminophen. Always read the labels carefully to avoid taking more acetaminophen. What side effects may I notice from receiving this medicine? Side effects that you should report to your doctor or health care professional as soon as possible: -allergic reactions like skin rash, itching  or hives, swelling of the face, lips, or tongue -breathing difficulties, wheezing -confusion -light headedness or fainting spells -severe stomach pain -yellowing of the skin or the whites of the eyes Side effects that usually do not require medical attention (report to your doctor or health care professional if they continue or are bothersome): -dizziness -drowsiness -nausea -vomiting This list may not describe all possible side effects. Call your doctor for medical advice about side effects. You may report side effects to FDA at 1-800-FDA-1088. Where should I keep my medicine? Keep out of the reach of children. This medicine can be abused. Keep your medicine in a safe place to protect it from theft. Do not share this medicine with anyone. Selling or giving away this medicine is dangerous and against the law. Store at room temperature between 20 and 25 degrees C (68  and 77 degrees F). Keep container tightly closed. Protect from light.  Discard unused medicine and used packaging carefully. Pets and children can be harmed if they find used or lost packages. Flush any unused medicines down the toilet. Do not use the medicine after the expiration date. NOTE: This sheet is a summary. It may not cover all possible information. If you have questions about this medicine, talk to your doctor, pharmacist, or health care provider.  2013, [employer]. (09/22/2010 4:13:41 PM) Ondansetron tablets What is this medicine? ONDANSETRON (on DAN se tron) is used to treat nausea and vomiting caused by chemotherapy. It is also used to prevent or treat nausea and vomiting after surgery. This medicine may be used for other purposes; ask your health care provider or pharmacist if you have questions. What should I tell my health care provider before I take this medicine? They need to know if you have any of these conditions: -heart disease -history of irregular heartbeat -liver disease -low levels of  magnesium or potassium in the blood -an unusual or allergic reaction to ondansetron, granisetron, other medicines, foods, dyes, or preservatives -pregnant or trying to get pregnant -breast-feeding How should I use this medicine? Take this medicine by mouth with a glass of water. Follow the directions on your prescription label. Take your doses at regular intervals. Do not take your medicine more often than directed. Talk to your pediatrician regarding the use of this medicine in children. Special care may be needed. Overdosage: If you think you have taken too much of this medicine contact a poison control center or emergency room at once. NOTE: This medicine is only for you. Do not share this medicine with others. What if I miss a dose? If you miss a dose, take it as soon as you can. If it is almost time for your next dose, take only that dose. Do not take double or extra doses. What may interact with this medicine? Do not take this medicine with any of the following medications: -apomorphine  This medicine may also interact with the following medications: -carbamazepine -phenytoin -rifampicin -tramadol This list may not describe all possible interactions. Give your health care provider a list of all the medicines, herbs, non-prescription drugs, or dietary supplements you use. Also tell them if you smoke, drink alcohol, or use illegal drugs. Some items may interact with your medicine. What should I watch for while using this medicine? Check with your doctor or health care professional right away if you have any sign of an allergic reaction. What side effects may I notice from receiving this medicine? Side effects that you should report to your doctor or health care professional as soon as possible: -allergic reactions like skin rash, itching or hives, swelling of the face, lips or tongue -breathing problems -dizziness -fast or irregular heartbeat -feeling faint or lightheaded,  falls -fever and chills -swelling of the hands or feet -tightness in the chest Side effects that usually do not require medical attention (report to your doctor or health care professional if they continue or are bothersome): -constipation or diarrhea -headache This list may not describe all possible side effects. Call your doctor for medical advice about side effects. You may report side effects to FDA at 1-800-FDA-1088. Where should I keep my medicine? Keep out of the reach of children. Store between 2 and 30 degrees C (36 and 86 degrees F). Throw away any unused medicine after the expiration date. NOTE: This sheet is a summary. It may not  cover all possible information. If you have questions about this medicine, talk to your doctor, pharmacist, or health care provider.  2012, [employer]. (10/17/2009 11:18:31 AM)Lorazepam tablets What is this medicine? LORAZEPAM (lor A ze pam) is a benzodiazepine. It is used to treat anxiety. This medicine may be used for other purposes; ask your health care provider or pharmacist if you have questions. What should I tell my health care provider before I take this medicine? They need to know if you have any of these conditions: -alcohol or drug abuse problem -bipolar disorder, depression, psychosis or other mental health condition -glaucoma -kidney or liver disease -lung disease or breathing difficulties -myasthenia gravis -Parkinson's disease -seizures or a history of seizures -suicidal thoughts -an unusual or allergic reaction to lorazepam, other benzodiazepines, foods, dyes, or preservatives -pregnant or trying to get pregnant -breast-feeding How should I use this medicine? Take this medicine by mouth with a glass of water. Follow the directions on the prescription label. If it upsets your stomach, take it with food or milk. Take your medicine at regular intervals. Do not take it more often than directed. Do not stop taking except on  the advice of your doctor or health care professional. Talk to your pediatrician regarding the use of this medicine in children. Special care may be needed. Overdosage: If you think you have taken too much of this medicine contact a poison control center or emergency room at once. NOTE: This medicine is only for you. Do not share this medicine with others. What if I miss a dose? If you miss a dose, take it as soon as you can. If it is almost time for your next dose, take only that dose. Do not take double or extra doses. What may interact with this medicine? -barbiturate medicines for inducing sleep or treating seizures, like phenobarbital -clozapine -medicines for depression, mental problems or psychiatric disturbances -medicines for sleep -phenytoin -probenecid -theophylline -valproic acid This list may not describe all possible interactions. Give your health care provider a list of all the medicines, herbs, non-prescription drugs, or dietary supplements you use. Also tell them if you smoke, drink alcohol, or use illegal drugs. Some items may interact with your medicine. What should I watch for while using this medicine? Visit your doctor or health care professional for regular checks on your progress. Your body may become dependent on this medicine, ask your doctor or health care professional if you still need to take it. However, if you have been taking this medicine regularly for some time, do not suddenly stop taking it. You must gradually reduce the dose or you may get severe side effects. Ask your doctor or health care professional for advice before increasing or decreasing the dose. Even after you stop taking this medicine it can still affect your body for several days. You may get drowsy or dizzy. Do not drive, use machinery, or do anything that needs mental alertness until you know how this medicine affects you. To reduce the risk of dizzy and fainting spells, do not stand or sit up  quickly, especially if you are an older patient. Alcohol may increase dizziness and drowsiness. Avoid alcoholic drinks. Do not treat yourself for coughs, colds or allergies without asking your doctor or health care professional for advice. Some ingredients can increase possible side effects. What side effects may I notice from receiving this medicine? Side effects that you should report to your doctor or health care professional as soon as possible: -changes in vision -  confusion -depression -mood changes, excitability or aggressive behavior -movement difficulty, staggering or jerky movements -muscle cramps -restlessness -weakness or tiredness Side effects that usually do not require medical attention (report to your doctor or health care professional if they continue or are bothersome): -constipation or diarrhea -difficulty sleeping, nightmares -dizziness, drowsiness -headache -nausea, vomiting This list may not describe all possible side effects. Call your doctor for medical advice about side effects. You may report side effects to FDA at 1-800-FDA-1088. Where should I keep my medicine? Keep out of the reach of children. This medicine can be abused. Keep your medicine in a safe place to protect it from theft. Do not share this medicine with anyone. Selling or giving away this medicine is dangerous and against the law. Store at room temperature between 20 and 25 degrees C (68 and 77 degrees F). Protect from light. Keep container tightly closed. Throw away any unused medicine after the expiration date. NOTE: This sheet is a summary. It may not cover all possible information. If you have questions about this medicine, talk to your doctor, pharmacist, or health care provider.  2012, [employer]. (07/14/2007 2:58:20 PM)Lidocaine; Prilocaine cream What is this medicine? LIDOCAINE; PRILOCAINE (LYE doe kane; PRIL oh kane) is a topical anesthetic that causes loss of feeling in the skin  and surrounding tissues. It is used to numb the skin before procedures or injections. This medicine may be used for other purposes; ask your health care provider or pharmacist if you have questions. What should I tell my health care provider before I take this medicine? They need to know if you have any of these conditions: -glucose-6-phosphate deficiencies -heart disease -kidney or liver disease -methemoglobinemia -an unusual or allergic reaction to lidocaine, prilocaine, other medicines, foods, dyes, or preservatives -pregnant or trying to get pregnant -breast-feeding How should I use this medicine? This medicine is for external use only on the skin. Do not take by mouth. Follow the directions on the prescription label. Wash hands before and after use. Do not use more or leave in contact with the skin longer than directed. Do not apply to eyes or open wounds. It can cause irritation and blurred or temporary loss of vision. If this medicine comes in contact with your eyes, immediately rinse the eye with water. Do not touch or rub the eye. Contact your health care provider right away. Talk to your pediatrician regarding the use of this medicine in children. While this medicine may be prescribed for children for selected conditions, precautions do apply. Overdosage: If you think you have taken too much of this medicine contact a poison control center or emergency room at once. NOTE: This medicine is only for you. Do not share this medicine with others. What if I miss a dose? This medicine is usually only applied once prior to each procedure. It must be in contact with the skin for a period of time for it to work. If you applied this medicine later than directed, tell your health care professional before starting the procedure. What may interact with this medicine? -acetaminophen -chloroquine -dapsone -medicines to control heart rhythm -nitrates like nitroglycerin and nitroprusside -other  ointments, creams, or sprays that may contain anesthetic medicine -phenobarbital -phenytoin -quinine -sulfonamides like sulfacetamide, sulfamethoxazole, sulfasalazine and others This list may not describe all possible interactions. Give your health care provider a list of all the medicines, herbs, non-prescription drugs, or dietary supplements you use. Also tell them if you smoke, drink alcohol, or use illegal drugs. Some  items may interact with your medicine. What should I watch for while using this medicine? Be careful to avoid injury to the treated area while it is numb and you are not aware of pain. Avoid scratching, rubbing, or exposing the treated area to hot or cold temperatures until complete sensation has returned. The numb feeling will wear off a few hours after applying the cream. What side effects may I notice from receiving this medicine? Side effects that you should report to your doctor or health care professional as soon as possible: -blurred vision -chest pain -difficulty breathing -dizziness -drowsiness -fast or irregular heartbeat -skin rash or itching -swelling of your throat, lips, or face -trembling Side effects that usually do not require medical attention (report to your doctor or health care professional if they continue or are bothersome): -changes in ability to feel hot or cold -redness and swelling at the application site This list may not describe all possible side effects. Call your doctor for medical advice about side effects. You may report side effects to FDA at 1-800-FDA-1088. Where should I keep my medicine? Keep out of reach of children. Store at room temperature between 15 and 30 degrees C (59 and 86 degrees F). Keep container tightly closed. Throw away any unused medicine after the expiration date. NOTE: This sheet is a summary. It may not cover all possible information. If you have questions about this medicine, talk to your doctor, pharmacist, or  health care provider.  2013, [employer]. (07/17/2007 5:14:35 PM) Gabapentin capsules or tablets What is this medicine? GABAPENTIN (GA ba pen tin) is used to control partial seizures in adults with epilepsy. It is also used to treat certain types of nerve pain. This medicine may be used for other purposes; ask your health care provider or pharmacist if you have questions. What should I tell my health care provider before I take this medicine? They need to know if you have any of these conditions: -kidney disease -suicidal thoughts, plans, or attempt; a previous suicide attempt by you or a family member -an unusual or allergic reaction to gabapentin, other medicines, foods, dyes, or preservatives -pregnant or trying to get pregnant -breast-feeding How should I use this medicine? Take this medicine by mouth. Swallow it with a drink of water. Follow the directions on the prescription label. If this medicine upsets your stomach, take it with food or milk. Take your medicine at regular intervals. Do not take it more often than directed. If you are directed to break the 600 or 800 mg tablets in half as part of your dose, the extra half tablet should be used for the next dose. If you have not used the extra half tablet within 3 days, it should be thrown away. A special MedGuide will be given to you by the pharmacist with each prescription and refill. Be sure to read this information carefully each time. Talk to your pediatrician regarding the use of this medicine in children. Special care may be needed. Overdosage: If you think you have taken too much of this medicine contact a poison control center or emergency room at once. NOTE: This medicine is only for you. Do not share this medicine with others. What if I miss a dose? If you miss a dose, take it as soon as you can. If it is almost time for your next dose, take only that dose. Do not take double or extra doses. What may interact with  this medicine? -antacids -hydrocodone -morphine -naproxen -sevelamer This  list may not describe all possible interactions. Give your health care provider a list of all the medicines, herbs, non-prescription drugs, or dietary supplements you use. Also tell them if you smoke, drink alcohol, or use illegal drugs. Some items may interact with your medicine. What should I watch for while using this medicine? Visit your doctor or health care professional for regular checks on your progress. You may want to keep a record at home of how you feel your condition is responding to treatment. You may want to share this information with your doctor or health care professional at each visit. You should contact your doctor or health care professional if your seizures get worse or if you have any new types of seizures. Do not stop taking this medicine or any of your seizure medicines unless instructed by your doctor or health care professional. Stopping your medicine suddenly can increase your seizures or their severity. Wear a medical identification bracelet or chain if you are taking this medicine for seizures, and carry a card that lists all your medications. You may get drowsy, dizzy, or have blurred vision. Do not drive, use machinery, or do anything that needs mental alertness until you know how this medicine affects you. To reduce dizzy or fainting spells, do not sit or stand up quickly, especially if you are an older patient. Alcohol can increase drowsiness and dizziness. Avoid alcoholic drinks. Your mouth may get dry. Chewing sugarless gum or sucking hard candy, and drinking plenty of water will help. The use of this medicine may increase the chance of suicidal thoughts or actions. Pay special attention to how you are responding while on this medicine. Any worsening of mood, or thoughts of suicide or dying should be reported to your health care professional right away. Women who become pregnant while using this  medicine may enroll in the [place] [employer] Antiepileptic Drug Pregnancy Registry by calling [phone]. This registry collects information about the safety of antiepileptic drug use during pregnancy. What side effects may I notice from receiving this medicine? Side effects that you should report to your doctor or health care professional as soon as possible: -allergic reactions like skin rash, itching or hives, swelling of the face, lips, or tongue -worsening of mood, thoughts or actions of suicide or dying Side effects that usually do not require medical attention (report to your doctor or health care professional if they continue or are bothersome): -constipation -difficulty walking or controlling muscle movements -nausea -slurred speech -tremors -weight gain This list may not describe all possible side effects. Call your doctor for medical advice about side effects. You may report side effects to FDA at 1-800-FDA-1088. Where should I keep my medicine? Keep out of reach of children. Store at room temperature between 15 and 30 degrees C (59 and 86 degrees F). Throw away any unused medicine after the expiration date. NOTE: This sheet is a summary. It may not cover all possible information. If you have questions about this medicine, talk to your doctor, pharmacist, or health care provider.  2012, [employer]. (09/09/2009 6:06:26 PM)Iron tablets, capsules, extended-release tablets What is this medicine? IRON (AHY ern) replaces iron that is essential to healthy red blood cells. Iron is used to treat iron deficiency anemia. Anemia may cause problems like tiredness, shortness of breath, or slowed growth in children. Only take iron if your doctor has told you to. Do not treat yourself with iron if you are feeling tired. Most healthy people get enough iron in their diets,  particularly if they eat cereals, meat, poultry, and fish. This medicine may be used for other purposes; ask your  health care provider or pharmacist if you have questions. What should I tell my health care provider before I take this medicine? They need to know if you have any of these conditions: -frequently drink alcohol -bowel disease -hemolytic anemia -iron overload (hemochromatosis, hemosiderosis) -liver disease -problems with swallowing -stomach ulcer or other stomach problems -an unusual or allergic reaction to iron, other medicines, foods, dyes, or preservatives -pregnant or trying to get pregnant -breast-feeding How should I use this medicine? Take this medicine by mouth with a glass of water or fruit juice. Follow the directions on the prescription label. Swallow whole. Do not crush or chew. Take this medicine in an upright or sitting position. Try to take any bedtime doses at least 10 minutes before lying down. You may take this medicine with food. Take your medicine at regular intervals. Do not take your medicine more often than directed. Do not stop taking except on your doctor's advice. Talk to your pediatrician regarding the use of this medicine in children. While this drug may be prescribed for selected conditions, precautions do apply. Overdosage: If you think you have taken too much of this medicine contact a poison control center or emergency room at once. NOTE: This medicine is only for you. Do not share this medicine with others. What if I miss a dose? If you miss a dose, take it as soon as you can. If it is almost time for your next dose, take only that dose. Do not take double or extra doses. What may interact with this medicine? If you are taking this iron product, you should not take iron in any other medicine or dietary supplement. This medicine may also interact with the following medications: -alendronate -antacids -cefdinir -chloramphenicol -cholestyramine -deferoxamine -dimercaprol -etidronate -medicines for stomach ulcers or other stomach problems -pancreatic  enzymes -quinolone antibiotics (examples: Cipro, Floxin, Levaquin, Tequin and others) -risedronate -tetracycline antibiotics (examples: doxycycline, tetracycline, minocycline, and others) -thyroid hormones This list may not describe all possible interactions. Give your health care provider a list of all the medicines, herbs, non-prescription drugs, or dietary supplements you use. Also tell them if you smoke, drink alcohol, or use illegal drugs. Some items may interact with your medicine. What should I watch for while using this medicine? Use iron supplements only as directed by your health care professional. Bonita Quin will need important blood work while you are taking this medicine. It may take 3 to 6 months of therapy to treat low iron levels. Pregnant women should follow the dose and length of iron treatment as directed by their doctors. Do not use iron longer than prescribed, and do not take a higher dose than recommended. Long-term use may cause excess iron to build-up in the body. Do not take iron with antacids. If you need to take an antacid, take it 2 hours after a dose of iron. What side effects may I notice from receiving this medicine? Side effects that you should report to your doctor or health care professional as soon as possible: -allergic reactions like skin rash, itching or hives, swelling of the face, lips, or tongue -blue lips, nails, or palms -dark colored stools (this may be due to the iron, but can indicate a more serious condition) -drowsiness -pain with or difficulty swallowing -pale or clammy skin -seizures -stomach pain -unusually weak or tired -vomiting -weak, fast, or irregular heartbeat Side effects that usually  do not require medical attention (report to your doctor or health care professional if they continue or are bothersome): -constipation -indigestion -nausea or stomach upset This list may not describe all possible side effects. Call your doctor for medical  advice about side effects. You may report side effects to FDA at 1-800-FDA-1088. Where should I keep my medicine? Keep out of the reach of children. Even small amounts of iron can be harmful to a child. Store at room temperature between 15 and 30 degrees C (59 and 86 degrees F). Keep container tightly closed. Throw away any unused medicine after the expiration date. NOTE: This sheet is a summary. It may not cover all possible information. If you have questions about this medicine, talk to your doctor, pharmacist, or health care provider.  2013, [employer]. (05/30/2007 5:03:41 PM) Dexamethasone tablets What is this medicine? DEXAMETHASONE (dex a METH a sone) is a corticosteroid. It is commonly used to treat inflammation of the skin, joints, lungs, and other organs. Common conditions treated include asthma, allergies, and arthritis. It is also used for other conditions, such as blood disorders and diseases of the adrenal glands. This medicine may be used for other purposes; ask your health care provider or pharmacist if you have questions. What should I tell my health care provider before I take this medicine? They need to know if you have any of these conditions: -Cushing's syndrome -diabetes -glaucoma -heart problems or disease -high blood pressure -infection like herpes, measles, tuberculosis, or chickenpox -kidney disease -liver disease -mental problems -myasthenia gravis -osteoporosis -previous heart attack -seizures -stomach, ulcer or intestine disease including colitis and diverticulitis -thyroid problem -an unusual or allergic reaction to dexamethasone, corticosteroids, other medicines, lactose, foods, dyes, or preservatives -pregnant or trying to get pregnant -breast-feeding How should I use this medicine? Take this medicine by mouth with a drink of water. Follow the directions on the prescription label. Take it with food or milk to avoid stomach upset. If you are  taking this medicine once a day, take it in the morning. Do not take more medicine than you are told to take. Do not suddenly stop taking your medicine because you may develop a severe reaction. Your doctor will tell you how much medicine to take. If your doctor wants you to stop the medicine, the dose may be slowly lowered over time to avoid any side effects. Talk to your pediatrician regarding the use of this medicine in children. Special care may be needed. Patients over 23 years old may have a stronger reaction and need a smaller dose. Overdosage: If you think you have taken too much of this medicine contact a poison control center or emergency room at once. NOTE: This medicine is only for you. Do not share this medicine with others. What if I miss a dose? If you miss a dose, take it as soon as you can. If it is almost time for your next dose, talk to your doctor or health care professional. You may need to miss a dose or take an extra dose. Do not take double or extra doses without advice. What may interact with this medicine? Do not take this medicine with any of the following medications: -mifepristone, RU-486 -vaccines This medicine may also interact with the following medications: -amphotericin B -antibiotics like clarithromycin, erythromycin, and troleandomycin -aspirin and aspirin-like drugs -barbiturates like phenobarbital -carbamazepine -cholestyramine -cholinesterase inhibitors like donepezil, galantamine, rivastigmine, and tacrine -cyclosporine -digoxin -diuretics -ephedrine -female hormones, like estrogens or progestins and birth control pills -  indinavir -isoniazid -ketoconazole -medicines for diabetes -medicines that improve muscle tone or strength for conditions like myasthenia gravis -NSAIDs, medicines for pain and inflammation, like ibuprofen or naproxen -phenytoin -rifampin -thalidomide -warfarin This list may not describe all possible interactions. Give your  health care provider a list of all the medicines, herbs, non-prescription drugs, or dietary supplements you use. Also tell them if you smoke, drink alcohol, or use illegal drugs. Some items may interact with your medicine. What should I watch for while using this medicine? Visit your doctor or health care professional for regular checks on your progress. If you are taking this medicine over a prolonged period, carry an identification card with your name and address, the type and dose of your medicine, and your doctor's name and address. This medicine may increase your risk of getting an infection. Stay away from people who are sick. Tell your doctor or health care professional if you are around anyone with measles or chickenpox. If you are going to have surgery, tell your doctor or health care professional that you have taken this medicine within the last twelve months. Ask your doctor or health care professional about your diet. You may need to lower the amount of salt you eat. The medicine can increase your blood sugar. If you are a diabetic check with your doctor if you need help adjusting the dose of your diabetic medicine. What side effects may I notice from receiving this medicine? Side effects that you should report to your doctor or health care professional as soon as possible: -allergic reactions like skin rash, itching or hives, swelling of the face, lips, or tongue -changes in vision -fever, sore throat, sneezing, cough, or other signs of infection, wounds that will not heal -increased thirst -mental depression, mood swings, mistaken feelings of self importance or of being mistreated -pain in hips, back, ribs, arms, shoulders, or legs -redness, blistering, peeling or loosening of the skin, including inside the mouth -trouble passing urine or change in the amount of urine -swelling of feet or lower legs -unusual bleeding or bruising Side effects that usually do not require medical  attention (report to your doctor or health care professional if they continue or are bothersome): -headache -nausea, vomiting -skin problems, acne, thin and shiny skin -weight gain This list may not describe all possible side effects. Call your doctor for medical advice about side effects. You may report side effects to FDA at 1-800-FDA-1088. Where should I keep my medicine? Keep out of the reach of children. Store at room temperature between 20 and 25 degrees C (68 and 77 degrees F). Protect from light. Throw away any unused medicine after the expiration date. NOTE: This sheet is a summary. It may not cover all possible information. If you have questions about this medicine, talk to your doctor, pharmacist, or health care provider.  2012, [employer]. (05/04/2007 2:02:13 PM)

## 2012-10-16 NOTE — Progress Notes (Addendum)
OFFICE PROGRESS NOTE  CC  [name], MD [address] [place] [address] Dr. Everardo Beals Dr. 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 Patient had a palpable mass at the 3:00 position. She was seen at the breast Center on 06/20/2012 she had a mammogram and ultrasound showing a 1.7 cm mass at the 3:00 position. 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 Tumor was grade 3.  #2 MRIs performed on 06/26/2012 showed no biopsy mass at 3:00 but also 2 other adjacent masses at 1 at 2:00 measuring 0.8 x 1.6 x 2.2 cm andalso 6 mm ill-defined mass posterior to the biopsy-proven malignancy.  #3 patient has underwent Port-A-Cath placement in order to begin neoadjuvant chemotherapy initially consisting of Adriamycin Cytoxan to be given dose dense for 4 cycles followed by weekly Taxol and carboplatinum for a total of 12 weeks.  #4 patient is now status post Adriamycin Cytoxan given dose dense for 4 cycles administered from 07/17/2012 through 09/04/2012 neoadjuvantly  #5 she was then begun on weekly Taxol carboplatinum beginning 09/11/2012  CURRENT THERAPY: Taxol Carbo cycle 6  INTERVAL HISTORY: Jaime Murillo 52 y.o. female returns for evaluation prior to Taxol/Carbo cycle 6.  She is doing well today.  She has mild numbness in her fingertips and toes, that she Neurontin and super b complex for.  It is unchanged, and she denies any motor difficulties.  The upper respiratory tract infection and dysuria has resolved.  She completed antibiotics and her last urine culture did not grow anything.  She does have occasional night sweats.  Otherwise, she denies fevers, chills,  nausea, vomiting, constipation, diarrhea, or further concerns.   She is requesting an idea of her chemotherapy, surgery and radiation dates so she can plan to go back to school in January.  She also wants more information about her medication list and side effects for disability paperwork.    MEDICAL HISTORY: Past Medical History  Diagnosis Date  . HSV infection   . NSVD (normal spontaneous vaginal delivery)   . Hypertension     takes HCTZ  . Urinary frequency   . Anemia     takes Ferrous Sulfate occasionally  . Breast cancer 2014    triple negative    ALLERGIES:  has No Known Allergies.  MEDICATIONS:  Current Outpatient Prescriptions  Medication Sig Dispense Refill  . Alum & Mag Hydroxide-Simeth (MAGIC MOUTHWASH W/LIDOCAINE) SOLN Take 5 mL by mouth 4 (four) times daily. Swish and spit  500 mL  0  . dexamethasone (DECADRON) 4 MG tablet Take 1 tablet (4 mg total) by mouth 2 (two) times daily with a meal. Take 2 tabs every day on the day after chemo an then take 2 tabs twice a day with food for 2 days.  60 tablet  0  . ferrous sulfate 325 (65 FE) MG tablet Take 325 mg by mouth daily with breakfast.      . gabapentin (NEURONTIN) 100 MG capsule Take 1 capsule (100 mg total) by mouth 3 (three) times daily.  90 capsule  0  . lidocaine-prilocaine (EMLA) cream Apply 1 application topically as needed (apply to PAC site 1-2 hours prior to treatment.).      Marland Kitchen LORazepam (  ATIVAN) 0.5 MG tablet Take 0.5 mg by mouth every 6 (six) hours as needed for anxiety (as needed for nausea and vomitting).      . ondansetron (ZOFRAN) 8 MG tablet Take 8 mg by mouth every 8 (eight) hours as needed for nausea (Take 1 pill by mouth twice a day as needed  for nausea and vomitting starting 3rd day after chemo).      Marland Kitchen oxyCODONE-acetaminophen (ROXICET) 5-325 MG per tablet Take 1-2 tablets by mouth every 4 (four) hours as needed for pain.  30 tablet  0  . prochlorperazine (COMPAZINE) 10 MG tablet Take 10 mg by mouth every  6 (six) hours as needed (for nausea and vomitting).      Marland Kitchen UNABLE TO FIND Apply 1 application topically once. 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  1 application  0  . valACYclovir (VALTREX) 500 MG tablet Take 500 mg by mouth daily as needed. For herpes      . hydrochlorothiazide (HYDRODIURIL) 25 MG tablet Take 1 tablet (25 mg total) by mouth daily.  30 tablet  11  . prochlorperazine (COMPAZINE) 25 MG suppository Place 25 mg rectally every 12 (twelve) hours as needed for nausea (as needed for nausea and vomitting).       No current facility-administered medications for this visit.    SURGICAL HISTORY:  Past Surgical History  Procedure Laterality Date  . Tubal ligation  2003    LTL  . Portacath placement N/A 07/06/2012    Procedure: INSERTION PORT-A-CATH;  Surgeon: Almond Lint, MD;  Location: [facility] OR;  Service: General;  Laterality: N/A;    REVIEW OF SYSTEMS: A 10 point review of systems was conducted and is otherwise negative except for what is noted above.     PHYSICAL EXAMINATION: Blood pressure 114/73, pulse 98, temperature 98.3 F (36.8 C), temperature source Oral, resp. rate 20, height 5\' 3"  (1.6 m), weight 122 lb 14.4 oz (55.747 kg). Body mass index is 21.78 kg/(m^2). General: Patient is a well appearing female in no acute distress HEENT: PERRLA, sclerae anicteric no conjunctival pallor, MMM Neck: supple, no palpable adenopathy Lungs: clear to auscultation bilaterally, no wheezes, rhonchi, or rales Cardiovascular: regular rate rhythm, S1, S2, no murmurs, rubs or gallops Abdomen: Soft, non-tender, non-distended, normoactive bowel sounds, no HSM Extremities: warm and well perfused, no clubbing, cyanosis, or edema Skin: No rashes or lesions Neuro: Non-focal Breast examination: Left breast no masses or nipple discharge Right breast reveals 3 discrete masses at the 3:00 position 2:00 position.  It is nontender there is no nipple  discharge. softer. ECOG PERFORMANCE STATUS: 0 - Asymptomatic  LABORATORY DATA: Lab Results  Component Value Date   WBC 2.5* 10/16/2012   HGB 8.6* 10/16/2012   HCT 25.4* 10/16/2012   MCV 93.4 10/16/2012   PLT 215 10/16/2012      Chemistry      Component Value Date/Time   NA 137 10/16/2012 0919   NA 137 06/06/2012 0853   K 3.8 10/16/2012 0919   K 3.8 06/06/2012 0853   CL 104 07/17/2012 1357   CL 102 06/06/2012 0853   CO2 25 10/16/2012 0919   CO2 26 06/06/2012 0853   BUN 11.9 10/16/2012 0919   BUN 12 06/06/2012 0853   CREATININE 0.6 10/16/2012 0919   CREATININE 0.70 06/06/2012 0853   CREATININE 0.85 04/24/2009 1114      Component Value Date/Time   CALCIUM 8.6 10/16/2012 0919   CALCIUM 9.3 06/06/2012  0853   ALKPHOS 48 10/16/2012 0919   ALKPHOS 35* 06/06/2012 0853   AST 21 10/16/2012 0919   AST 14 06/06/2012 0853   ALT 16 10/16/2012 0919   ALT 9 06/06/2012 0853   BILITOT 0.40 10/16/2012 0919   BILITOT 0.8 06/06/2012 0853     ADDITIONAL INFORMATION: PROGNOSTIC INDICATORS - ACIS Results: IMMUNOHISTOCHEMICAL AND MORPHOMETRIC ANALYSIS BY THE AUTOMATED CELLULAR IMAGING SYSTEM (ACIS) Estrogen Receptor: 0%, NEGATIVE Progesterone Receptor: 0%, NEGATIVE Proliferation Marker Ki67: 95% COMMENT: The negative hormone receptor study(ies) in this case have an internal positive control. REFERENCE RANGE ESTROGEN RECEPTOR NEGATIVE <1% POSITIVE =>1% PROGESTERONE RECEPTOR NEGATIVE <1% POSITIVE =>1% All controls stained appropriately Pecola Leisure MD Pathologist, Electronic Signature ( Signed 06/28/2012) CHROMOGENIC IN-SITU HYBRIDIZATION Results: HER-2/NEU BY CISH - NO AMPLIFICATION OF HER-2 DETECTED. RESULT RATIO OF HER2: CEP 17 SIGNALS 1.26 1 of 3 Duplicate copy FINAL for LADDIE, NAEEM [phone]) ADDITIONAL INFORMATION:(continued) AVERAGE HER2 COPY NUMBER PER CELL 2.45 REFERENCE RANGE NEGATIVE HER2/Chr17 Ratio <2.0 and Average HER2 copy number <4.0 EQUIVOCAL HER2/Chr17 Ratio <2.0 and Average  HER2 copy number 4.0 and <6.0 POSITIVE HER2/Chr17 Ratio >=2.0 and/or Average HER2 copy number >=6.0 Jimmy Picket MD Pathologist, Electronic Signature ( Signed 06/26/2012) FINAL DIAGNOSIS Diagnosis Breast, right, needle core biopsy, 3:00 position - INVASIVE DUCTAL CARCINOMA SEE COMMENT. - DUCTAL CARCINOMA IN SITU Microscopic Comment Although the grade of tumor is best assessed at resection, with these biopsies, both the invasive and in situ carcinoma are grade III. Breast prognostic studies are pending and will be reported in an addendum. The case is reviewed with Dr. Raynald Blend who concurs. (CRR:gt, 06/21/12) [place] RUND DO Pathologist, Electronic FINAL DIAGNOSIS Diagnosis Breast, right, needle core biopsy, mass - BENIGN BREAST TISSUE WITH FOCAL PSEUDOANGIOMATOUS STROMAL HYPERPLASIA (PASH). NO EVIDENCE OF MALIGNANCY. Microscopic Comment [facility] on 07/13/12. (JDP:caf 07/13/12)  RADIOGRAPHIC STUDIES:  Dg Chest 2 View  07/04/2012   *RADIOLOGY REPORT*  Clinical Data: Insertion of Port-A-Cath.  Breast cancer. Hypertension.  CHEST - 2 VIEW  Comparison: CT of the chest on 07/03/2012  Findings: Cardiomediastinal silhouette is within normal limits. The lungs are free of focal consolidations and pleural effusions. Bony structures have a normal appearance.  Contrast is identified within the colon following CT of the abdomen.  IMPRESSION: Negative exam.   Original Report Authenticated By: Norva Pavlov, M.D.   Ct Chest W Contrast  07/03/2012   *RADIOLOGY REPORT*  Clinical Data:  Breast cancer.  No therapy yet.  Lower inner quadrant of right breast.  CT CHEST, ABDOMEN AND PELVIS WITH CONTRAST  Technique: Contiguous axial images of the chest abdomen and pelvis were obtained after IV contrast administration.  Contrast: 80 ml Omnipaque-300  Comparison: Breast MR of 06/26/2012.  No prior CTs.  CT CHEST  Findings: Lung windows demonstrate minimal left lower lobe subpleural  nodularity at 3 mm on image 40/series 4.  Right lung clear.  Soft tissue windows demonstrate small bilateral axillary nodes, without adenopathy.  No subpectoral adenopathy. No supraclavicular adenopathy.  Normal heart size, without pericardial effusion.  No central pulmonary embolism, on this non-dedicated study.  No mediastinal or hilar adenopathy.  No internal mammary adenopathy. Minimal residual thymic tissue in the anterior mediastinum (image 17/series 2).  IMPRESSION:  1. No acute process or evidence of metastatic disease in the chest. 2.  Probable subpleural lymph node in the left lower lobe. Recommend attention on follow-up.  CT ABDOMEN AND PELVIS  Findings:  Mild early phase of hepatic enhancement  which decreases sensitivity for focal liver lesions.  None identified.  Normal spleen, stomach, pancreas, gallbladder, biliary tract, adrenal glands, kidneys. No retroperitoneal or retrocrural adenopathy.  Normal colon, appendix, and terminal ileum.  Normal small bowel without abdominal ascites.  No pelvic adenopathy.    Normal urinary bladder and uterus.  No adnexal mass.  Trace free pelvic fluid is likely physiologic.  Transitional right-sided lumbosacral anatomy.  Degenerative disc disease at the lumbosacral junction.  IMPRESSION:  No acute process or evidence of metastatic disease in the abdomen or pelvis.   Original Report Authenticated By: Jeronimo Greaves, M.D.   US Breast Right  06/29/2012   *RADIOLOGY REPORT*  Clinical Data:  52 year old female with newly diagnosed right breast cancer.  Suspicious abnormal enhancement anterior and superior to the biopsy-proven neoplasm - for Murillo look ultrasound evaluation.  RIGHT BREAST ULTRASOUND  Comparison:  Prior mammograms, ultrasounds and MRI.  Findings: Ultrasound is performed, showing the biopsy-proven neoplasm at the 3 o'clock position of the right breast 4 cm from the nipple. No other discrete sonographic abnormalities are identified, specifically medial,  superior, and anterior to the biopsy-proven neoplasm.  IMPRESSION: Patchy abnormal enhancement medial superior to the biopsy-proven neoplasm identified on recent MRI is not visualized sonographically as a discrete abnormality.  MR guided biopsy is recommended if breast conservation surgery is considered.  BI-RADS CATEGORY 6:  Known biopsy-proven malignancy - appropriate action should be taken.  RECOMMENDATION: MR guided right breast biopsy, which will be scheduled by our office.  I have discussed the findings and recommendations with the patient. Results were also provided in writing at the conclusion of the visit.  If applicable, a reminder letter will be sent to the patient regarding the next appointment.   Original Report Authenticated By: Harmon Pier, M.D.   US Breast Right  06/20/2012   *RADIOLOGY REPORT*  Clinical Data:  52 year old female for annual bilateral mammograms and palpable mass in the upper and inner right breast discovered on clinical examination.  DIGITAL DIAGNOSTIC BILATERAL MAMMOGRAM WITH CAD AND RIGHT BREAST ULTRASOUND:  Comparison:  03/21/2009 and prior mammograms dating back to 01/21/2005  Findings:  ACR Breast Density Category 4: The breast tissue is extremely dense.  Routine and spot compression views of the right breast demonstrate no evidence of suspicious mass or distortion. Scattered punctate calcifications within the breasts bilaterally are again identified.  Mammographic images were processed with CAD.  On physical exam, thickening is identified at the 12 o'clock position of the right breast 4 cm from the nipple. A firm palpable mass identified at the 3 o'clock position of the right breast 4 cm from the nipple.  Ultrasound is performed, showing no evidence of solid or cystic mass, distortion or abnormal areas of shadowing at the 12 o'clock position of the right breast. A 7 x 6 x 17 mm slightly irregular oval hypoechoic mass with calcifications is horizontally oriented at the 3  o'clock position of the right breast 4 cm from the nipple.  IMPRESSION: Indeterminate 7 x 6 x 17 mm hypoechoic mass at the 3 o'clock position of the right breast.  Tissue sampling is recommended.  No mammographic evidence of breast malignancy bilaterally.  No mammographic, palpable or sonographic abnormality at the 12 o'clock position of the right breast, in the area of palpable concern.  BI-RADS CATEGORY 4:  Suspicious abnormality - biopsy should be considered.  RECOMMENDATION:  Ultrasound guided right breast biopsy, which will be performed today but dictated in a separate report.  I have discussed  the findings and recommendations with the patient. Results were also provided in writing at the conclusion of the visit.  If applicable, a reminder letter will be sent to the patient regarding the next appointment.   Original Report Authenticated By: Harmon Pier, M.D.   Ct Abdomen Pelvis W Contrast  07/03/2012   *RADIOLOGY REPORT*  Clinical Data:  Breast cancer.  No therapy yet.  Lower inner quadrant of right breast.  CT CHEST, ABDOMEN AND PELVIS WITH CONTRAST  Technique: Contiguous axial images of the chest abdomen and pelvis were obtained after IV contrast administration.  Contrast: 80 ml Omnipaque-300  Comparison: Breast MR of 06/26/2012.  No prior CTs.  CT CHEST  Findings: Lung windows demonstrate minimal left lower lobe subpleural nodularity at 3 mm on image 40/series 4.  Right lung clear.  Soft tissue windows demonstrate small bilateral axillary nodes, without adenopathy.  No subpectoral adenopathy. No supraclavicular adenopathy.  Normal heart size, without pericardial effusion.  No central pulmonary embolism, on this non-dedicated study.  No mediastinal or hilar adenopathy.  No internal mammary adenopathy. Minimal residual thymic tissue in the anterior mediastinum (image 17/series 2).  IMPRESSION:  1. No acute process or evidence of metastatic disease in the chest. 2.  Probable subpleural lymph node in the left  lower lobe. Recommend attention on follow-up.  CT ABDOMEN AND PELVIS  Findings:  Mild early phase of hepatic enhancement which decreases sensitivity for focal liver lesions.  None identified.  Normal spleen, stomach, pancreas, gallbladder, biliary tract, adrenal glands, kidneys. No retroperitoneal or retrocrural adenopathy.  Normal colon, appendix, and terminal ileum.  Normal small bowel without abdominal ascites.  No pelvic adenopathy.    Normal urinary bladder and uterus.  No adnexal mass.  Trace free pelvic fluid is likely physiologic.  Transitional right-sided lumbosacral anatomy.  Degenerative disc disease at the lumbosacral junction.  IMPRESSION:  No acute process or evidence of metastatic disease in the abdomen or pelvis.   Original Report Authenticated By: Jeronimo Greaves, M.D.   Mr Breast Right W Wo Contrast  07/12/2012   *RADIOLOGY REPORT*  Clinical Data:  Known invasive mammary carcinoma in the right breast at 3 o'clock posteriorly.  Additional site of abnormal enhancement in the right upper inner quadrant closer to the nipple for possible biopsy.  ATTEMPTED MRI GUIDED VACUUM ASSISTED BIOPSY OF THE RIGHT BREAST WITHOUT AND WITH CONTRAST  Comparison: Previous exams.  Technique: Multiplanar, multisequence MR images of the right breast were obtained prior to and following the intravenous administration of 9 ml of Mulithance.  I met with the patient, and we discussed the procedure of MRI guided biopsy, including risks, benefits, and alternatives. Specifically, we discussed the risks of infection, bleeding, tissue injury, clip migration, and inadequate sampling.  Informed, written consent was given.  Using sterile technique, 2% Lidocaine, MRI guidance, and a 9 gauge vacuum assisted device, biopsy was attempted using a mediolateral approach.  However, the procedure was unsuccessful due to the extremely thin breast compression.  IMPRESSION:  Unsuccessful MRI guided vacuum-assisted biopsy of the right breast.   Patient was sent to the [facility] for further evaluation with ultrasound.   Original Report Authenticated By: Cain Saupe, M.D.   Mr Breast Bilateral W Wo Contrast  06/26/2012   *RADIOLOGY REPORT*  Clinical Data: History of new diagnosis of breast cancer from recent ultrasound guided core needle biopsy right breast 06/20/2012 demonstrating invasive ductal carcinoma with DCIS.  GFR 89, creatinine 0.7 on 06/06/2012.  BILATERAL BREAST MRI WITH AND WITHOUT CONTRAST  Technique: Multiplanar, multisequence MR images of both breasts were obtained prior to and following the intravenous administration of 10ml of multihance.  Three dimensional images were evaluated at the independent DynaCad workstation.  Comparison:  Recent mammograms and ultrasound.  Findings: Examination demonstrates mild background parenchymal enhancement.  Right breast:  There is evidence of an ovoid gently lobulated and somewhat ill-defined heterogeneously enhancing mass over the deep third of the inner midportion of the right breast representing patient's biopsy-proven malignancy.  This mass contains central clip artifact and measures approximately 0.9 x 1.9 x 1.0 cm. The anterior border of this mass is located 5.5 cm from the nipple. There is a 6 mm slightly ill-defined enhancing ovoid mass 6 mm posterior lateral to the biopsy-proven malignancy lying just superficial to the pectoralis muscle.  There is also clumped non mass enhancement anterior and slightly superior to the biopsy- proven malignancy in the middle third at approximately the 2 o'clock position.  This measures approximately 0.8 x 1.6 x 1.2 cm and is located 3 cm from the nipple.  This is suspicious for multifocal disease.  No suspicious axillary or internal mammary lymph nodes.  Left breast:  No suspicious mass, enhancement or adenopathy.  IMPRESSION: Known biopsy-proven malignancy over the deep third of the inner mid right breast.  6 mm ill-defined enhancing ovoid mass 6 mm  posterior lateral to the biopsy-proven malignancy and lying just superficial to the pectoralis muscle.  Clumped non mass enhancement anterior and superior to patient's known malignancy at approximately the 2 o'clock position measuring 0.8 x 1.6 x 2.2 cm.  Findings likely represent multi focal disease.  RECOMMENDATION: Recommend Murillo look ultrasound and core biopsy if feasible of the clumped non mass enhancement at approximately the 2 o'clock position right breast anterior/superior to the known malignancy. If not seen by ultrasound, recommend proceeding to MR guided biopsy of this suspicious abnormality.  THREE-DIMENSIONAL MR IMAGE RENDERING ON INDEPENDENT WORKSTATION:  Three-dimensional MR images were rendered by post-processing of the original MR data on an independent workstation.  The three- dimensional MR images were interpreted, and findings were reported in the accompanying complete MRI report for this study.  BI-RADS CATEGORY 0:  Incomplete.  Need additional imaging evaluation and/or prior mammograms for comparison.   Original Report Authenticated By: Elberta Fortis, M.D.   Dg Chest Port 1 View  07/06/2012   *RADIOLOGY REPORT*  Clinical Data: Post left-sided port catheter insertion  PORTABLE CHEST - 1 VIEW  Comparison: 07/04/2012  Findings:  Grossly unchanged cardiac silhouette and mediastinal contours. Interval placement of a left anterior chest wall subclavian vein approach port-a-catheter with tip projecting over the superior cavoatrial junction. Grossly unchanged symmetric biapical pleural parenchymal thickening.  No pneumothorax.  No focal airspace opacities.  No pleural effusion.  No evidence of edema.  Unchanged bones.  IMPRESSION: Interval placement of a left anterior chest wall subclavian vein approach port-a-catheter with tip overlying the superior cavoatrial junction.  No pneumothorax.   Original Report Authenticated By: Tacey Ruiz, MD   Mm Digital Diagnostic Bilat  06/20/2012   *RADIOLOGY  REPORT*  Clinical Data:  52 year old female for annual bilateral mammograms and palpable mass in the upper and inner right breast discovered on clinical examination.  DIGITAL DIAGNOSTIC BILATERAL MAMMOGRAM WITH CAD AND RIGHT BREAST ULTRASOUND:  Comparison:  03/21/2009 and prior mammograms dating back to 01/21/2005  Findings:  ACR Breast Density Category 4: The breast tissue is extremely dense.  Routine and spot compression views of the right breast demonstrate no evidence  of suspicious mass or distortion. Scattered punctate calcifications within the breasts bilaterally are again identified.  Mammographic images were processed with CAD.  On physical exam, thickening is identified at the 12 o'clock position of the right breast 4 cm from the nipple. A firm palpable mass identified at the 3 o'clock position of the right breast 4 cm from the nipple.  Ultrasound is performed, showing no evidence of solid or cystic mass, distortion or abnormal areas of shadowing at the 12 o'clock position of the right breast. A 7 x 6 x 17 mm slightly irregular oval hypoechoic mass with calcifications is horizontally oriented at the 3 o'clock position of the right breast 4 cm from the nipple.  IMPRESSION: Indeterminate 7 x 6 x 17 mm hypoechoic mass at the 3 o'clock position of the right breast.  Tissue sampling is recommended.  No mammographic evidence of breast malignancy bilaterally.  No mammographic, palpable or sonographic abnormality at the 12 o'clock position of the right breast, in the area of palpable concern.  BI-RADS CATEGORY 4:  Suspicious abnormality - biopsy should be considered.  RECOMMENDATION:  Ultrasound guided right breast biopsy, which will be performed today but dictated in a separate report.  I have discussed the findings and recommendations with the patient. Results were also provided in writing at the conclusion of the visit.  If applicable, a reminder letter will be sent to the patient regarding the next appointment.    Original Report Authenticated By: Harmon Pier, M.D.   Mm Digital Diagnostic Unilat R  07/12/2012   *RADIOLOGY REPORT*  Clinical Data:  Ultrasound-guided core needle biopsy of a hypoechoic area at 2 o'clock 2 cm from the right nipple with clip placement.  Known invasive mammary carcinoma in the right breast. Unsuccessful right breast MRI guided biopsy earlier today.  DIGITAL DIAGNOSTIC RIGHT MAMMOGRAM  Comparison:  Previous exams.  Findings:  Films are performed following ultrasound guided biopsy of a small hypoechoic area at 2 o'clock 2 cm from the right nipple. The ribbon clip is positioned within the area of concern. The ribbon clip is approximately 2.6 cm anterior to the top hat clip from the original biopsy on 06/20/2012.  IMPRESSION: Appropriate clip placement following ultrasound-guided core needle biopsy of a hypoechoic area at 2 o'clock 2 cm in the right nipple.   Original Report Authenticated By: Cain Saupe, M.D.   Mm Digital Diagnostic Unilat R  06/20/2012   *RADIOLOGY REPORT*  Clinical Data:  52 year old female - evaluate clip placement following ultrasound guided right breast biopsy.  DIGITAL DIAGNOSTIC RIGHT MAMMOGRAM  Comparison:  Previous exams.  Findings:  Films are performed following ultrasound guided biopsy of the 7 x 6 x 17 mm hypoechoic mass at the 3 o'clock position of the right breast 4 cm from the nipple. The T shaped biopsy clip is in satisfactory position.  IMPRESSION: Satisfactory clip placement following ultrasound guided right breast biopsy.  Pathology will be followed.   Original Report Authenticated By: Harmon Pier, M.D.   Dg Fluoro Guide Cv Line-no Report  07/06/2012   CLINICAL DATA: port placement   FLOURO GUIDE CV LINE  Fluoroscopy was utilized by the requesting physician.  No radiographic  interpretation.    [place] Rt Breast Bx W Loc Dev 1st Lesion Img Bx Spec US Guide  07/13/2012   **ADDENDUM** CREATED: 07/13/2012 14:11:45  I spoke with the patient by telephone on  07/13/2012 to discuss pathology results.  Pathology demonstrates  pseudoangiomatous stromal hyperplasia.  The findings were called to the  office of Dr. Donell Beers. Although the imaging findings could be related to pseudoangiomatous stromal hyperplasia, excision of the anterior biopsy site is suggested at the time of patient's definitive surgery.  The patient is considering unilateral or bilateral mastectomy at this time.  The patient reports no problems at the biopsy site.  All questions were answered.  Recommendations:  Excision of the biopsy site. The patient is scheduled to begin chemotherapy in 4 days.  **END ADDENDUM** SIGNED BY: Blair Hailey. Manson Passey, M.D.  07/12/2012   *RADIOLOGY REPORT*  Clinical Data:  Unsuccessful MRI guided biopsy of the area of abnormal enhancement in the left anterior upper inner quadrant, anterior to the known invasive mammary carcinoma.  [place] look ultrasound performed and a 5 x 4 x 4 mm hypoechoic area found at 2 o'clock 2 cm from the right nipple which will be biopsied.  ULTRASOUND GUIDED VACUUM ASSISTED CORE BIOPSY OF THE RIGHT BREAST  Sonography of the right upper inner quadrant was performed.  There is an ill-defined hypoechoic 5 x 4 x 4 mm area at 2 o'clock 2 cm from the right nipple which will be biopsied with ultrasound guidance.  The patient and I discussed the procedure of ultrasound-guided biopsy, including benefits and alternatives.  We discussed the high likelihood of a successful procedure. We discussed the risks of the procedure including infection, bleeding, tissue injury, clip migration, and inadequate sampling.  Written informed consent was given.  Using sterile technique, 2% lidocaine, ultrasound guidance, and a 12 gauge vacuum assisted needle, biopsy was performed of the hypoechoic area at 2 o'clock 2 cm from the right nipple using a caudocranial approach.  At the conclusion of the procedure, a within tissue marker clip was deployed into the biopsy cavity. Follow-up  2-view mammogram was performed and dictated separately.  IMPRESSION: Ultrasound-guided biopsy of a 5 x 4 x 4 mm hypoechoic area at 2 o'clock 2 cm from the right nipple.  No apparent complications.   Original Report Authenticated By: Cain Saupe, M.D.   [place] Rt Breast Bx W Loc Dev 1st Lesion Img Bx Spec US Guide  06/21/2012   *RADIOLOGY REPORT*  Clinical Data:  Indeterminate 7 x 6 x 17 mm hypoechoic mass in the inner right breast - for tissue sampling.  ULTRASOUND GUIDED VACUUM ASSISTED CORE BIOPSY OF THE RIGHT BREAST  Comparison: Previous exams.  I met with the patient and we discussed the procedure of ultrasound- guided biopsy, including benefits and alternatives.  We discussed the high likelihood of a successful procedure. We discussed the risks of the procedure including infection, bleeding, tissue injury, clip migration, and inadequate sampling.  Informed written consent was given.  Using sterile technique, 2% lidocaine ultrasound guidance and a 12 gauge vacuum assisted needle biopsy was performed of the 7 x 6 x 17 mm hypoechoic mass at the 3 o'clock position of the right breast 4 cm from the nipple using a lateral approach.  At the conclusion of the procedure, a T shaped tissue marker clip was deployed into the biopsy cavity.  Follow-up 2-view mammogram was performed and dictated separately.  IMPRESSION: Ultrasound-guided biopsy of right breast mass.  No apparent complications.  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 Histology correlates with imaging findings.  The patient was contacted by phone on 06/21/2012 and these results given to her which she understood. Her questions were answered. She was encouraged to obtain breast cancer educational material from the [facility] at her convenience. The patient had no complaints with her biopsy  site.  Recommend surgery/oncology consultation.  An appointment at the [facility] has been scheduled for 06/28/2012. Recommend  bilateral breast MRI, which has been scheduled for 06/26/2012. The patient was informed of these appointments.   Original Report Authenticated By: Harmon Pier, M.D.    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 Patient had other areas seen on MRI biopsy of the [place] mass was negative. Patient has had her Port-A-Cath placed. She is proceeding neoadjuvant chemotherapy to possibly conserve her breast. Chemotherapy will consist of Adriamycin Cytoxan given dose dense for 4 cycles starting today 07/17/2012 with Neulasta support.  This was followed by Taxol/Carbo which started on 09/11/12.    #2 we discussed risks benefits and side effects of chemotherapy. She has had an echocardiogram as well as chemotherapy teaching class. Port-A-Cath was placed by Dr. Donell Beers. The port is functioning well.  #3 Taxol/Carbo cycle 6 today  #4 vaginal herpes outbreak  #5 neuropathy    PLAN:  #1  Doing well.  Labs are stable.  Patient will proceed with cycle 6 taxol/carbo today.    #2. Due to her anc of 1800 she will receive Neupogen 480 mcg daily x 3 days starting tomorrow.  We discussed this medication, risks and benefits today.    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  #4  I gave the patient a list of her medications along with the details.    #5 patient will return in one week's time for cycle 7 of Taxol/ carboplatinum.  All questions were answered. The patient knows to call the clinic with any problems, questions or concerns. We can certainly see the patient much sooner if necessary.  I spent 25 minutes counseling the patient face to face. The total time spent in the appointment was 30 minutes.  Jaime Ouch Lyn Hollingshead, NP Medical Oncology [facility] Phone: [phone]    10/16/2012, 9:56 PM     ATTENDING'S ATTESTATION:  I personally reviewed patient's chart, examined patient myself, formulated the treatment plan as followed.    Patient is here for her scheduled chemotherapy today this consists of cycle 6 of Taxol carboplatinum for triple negative breast cancer. She is overall tolerating it well except for neuropathies. She is currently on Neurontin. She is also taking super B complex. She tells me that this is somewhat helping her with her neuropathy.   The patient will be seen back in one week's time for cycle 7 of her treatment. We will plan on having her see her surgeon in a few weeks time as well  Jaime Second, MD Medical/Oncology [facility] [phone] (beeper) [phone] (Office)

## 2012-10-16 NOTE — Patient Instructions (Signed)
[facility] Discharge Instructions for Patients Receiving Chemotherapy  Today you received the following chemotherapy agents taxol/carbo  To help prevent nausea and vomiting after your treatment, we encourage you to take your nausea medication as directed.     If you develop nausea and vomiting that is not controlled by your nausea medication, call the clinic.   BELOW ARE SYMPTOMS THAT SHOULD BE REPORTED IMMEDIATELY: *FEVER GREATER THAN 100.5 F *CHILLS WITH OR WITHOUT FEVER NAUSEA AND VOMITING THAT IS NOT CONTROLLED WITH YOUR NAUSEA MEDICATION *UNUSUAL SHORTNESS OF BREATH *UNUSUAL BRUISING OR BLEEDING TENDERNESS IN MOUTH AND THROAT WITH OR WITHOUT PRESENCE OF ULCERS *URINARY PROBLEMS *BOWEL PROBLEMS UNUSUAL RASH Items with * indicate a potential emergency and should be followed up as soon as possible.  Feel free to call the clinic you have any questions or concerns. The clinic phone number is [phone].  

## 2012-10-16 NOTE — Telephone Encounter (Signed)
appts made and printed...td 

## 2012-10-16 NOTE — Telephone Encounter (Signed)
Per staff message and POF I have scheduled appts.  JMW  

## 2012-10-17 ENCOUNTER — Ambulatory Visit (HOSPITAL_BASED_OUTPATIENT_CLINIC_OR_DEPARTMENT_OTHER): Payer: Medicaid Other

## 2012-10-17 VITALS — BP 127/89 | HR 84 | Temp 97.9°F

## 2012-10-17 DIAGNOSIS — C50319 Malignant neoplasm of lower-inner quadrant of unspecified female breast: Secondary | ICD-10-CM

## 2012-10-17 DIAGNOSIS — C50311 Malignant neoplasm of lower-inner quadrant of right female breast: Secondary | ICD-10-CM

## 2012-10-17 MED ORDER — FILGRASTIM 480 MCG/0.8ML IJ SOLN
480.0000 ug | Freq: Once | INTRAMUSCULAR | Status: AC
  Administered 2012-10-17: 480 ug via SUBCUTANEOUS
  Filled 2012-10-17: qty 0.8

## 2012-10-17 NOTE — Patient Instructions (Addendum)
Filgrastim, G-CSF injection What is this medicine? FILGRASTIM, G-CSF (fil GRA stim) stimulates the formation of white blood cells. This medicine is given to patients with conditions that may cause a decrease in white blood cells, like those receiving certain types of chemotherapy or bone marrow transplant. It helps the bone marrow recover its ability to produce white blood cells. Increasing the amount of white blood cells helps to decrease the risk of infection and fever. This medicine may be used for other purposes; ask your health care provider or pharmacist if you have questions. What should I tell my health care provider before I take this medicine? They need to know if you have any of these conditions: -currently receiving radiation therapy -sickle cell disease -an unusual or allergic reaction to filgrastim, E. coli protein, other medicines, foods, dyes, or preservatives -pregnant or trying to get pregnant -breast-feeding How should I use this medicine? This medicine is for injection into a vein or injection under the skin. It is usually given by a health care professional in a hospital or clinic setting. If you get this medicine at home, you will be taught how to prepare and give this medicine. Always change the site for the injection under the skin. Let the solution warm to room temperature before you use it. Do not shake the solution before you withdraw a dose. Throw away any unused portion. Use exactly as directed. Take your medicine at regular intervals. Do not take your medicine more often than directed. It is important that you put your used needles and syringes in a special sharps container. Do not put them in a trash can. If you do not have a sharps container, call your pharmacist or healthcare provider to get one. Talk to your pediatrician regarding the use of this medicine in children. While this medicine may be prescribed for children for selected conditions, precautions do  apply. Overdosage: If you think you have taken too much of this medicine contact a poison control center or emergency room at once. NOTE: This medicine is only for you. Do not share this medicine with others. What if I miss a dose? Try not to miss doses. If you miss a dose take the dose as soon as you remember. If it is almost time for the next dose, do not take double doses unless told to by your doctor or health care professional. What may interact with this medicine? -lithium -medicines for cancer chemotherapy This list may not describe all possible interactions. Give your health care provider a list of all the medicines, herbs, non-prescription drugs, or dietary supplements you use. Also tell them if you smoke, drink alcohol, or use illegal drugs. Some items may interact with your medicine. What should I watch for while using this medicine? Visit your doctor or health care professional for regular checks on your progress. If you get a fever or any sign of infection while you are using this medicine, do not treat yourself. Check with your doctor or health care professional. Bone pain can usually be relieved by mild pain relievers such as acetaminophen or ibuprofen. Check with your doctor or health care professional before taking these medicines as they may hide a fever. Call your doctor or health care professional if the aches and pains are severe or do not go away. What side effects may I notice from receiving this medicine? Side effects that you should report to your doctor or health care professional as soon as possible: -allergic reactions like skin rash, itching   or hives, swelling of the face, lips, or tongue -difficulty breathing, wheezing -fever -pain, redness, or swelling at the injection site -stomach or side pain, or pain at the shoulder Side effects that usually do not require medical attention (report to your doctor or health care professional if they continue or are  bothersome): -bone pain (ribs, lower back, breast bone) -headache -skin rash This list may not describe all possible side effects. Call your doctor for medical advice about side effects. You may report side effects to FDA at 1-800-FDA-1088. Where should I keep my medicine? Keep out of the reach of children. Store in a refrigerator between 2 and 8 degrees C (36 and 46 degrees F). Do not freeze or leave in direct sunlight. If vials or syringes are left out of the refrigerator for more than 24 hours, they must be thrown away. Throw away unused vials after the expiration date on the carton. NOTE: This sheet is a summary. It may not cover all possible information. If you have questions about this medicine, talk to your doctor, pharmacist, or health care provider.  2013, [employer]. (03/29/2007 1:33:21 PM)  

## 2012-10-18 ENCOUNTER — Ambulatory Visit (HOSPITAL_BASED_OUTPATIENT_CLINIC_OR_DEPARTMENT_OTHER): Payer: Medicaid Other

## 2012-10-18 VITALS — BP 136/85 | HR 106 | Temp 98.1°F | Resp 20

## 2012-10-18 DIAGNOSIS — C50319 Malignant neoplasm of lower-inner quadrant of unspecified female breast: Secondary | ICD-10-CM

## 2012-10-18 DIAGNOSIS — C50311 Malignant neoplasm of lower-inner quadrant of right female breast: Secondary | ICD-10-CM

## 2012-10-18 DIAGNOSIS — Z5189 Encounter for other specified aftercare: Secondary | ICD-10-CM

## 2012-10-18 MED ORDER — FILGRASTIM 480 MCG/0.8ML IJ SOLN
480.0000 ug | Freq: Once | INTRAMUSCULAR | Status: AC
  Administered 2012-10-18: 480 ug via SUBCUTANEOUS
  Filled 2012-10-18: qty 0.8

## 2012-10-19 ENCOUNTER — Ambulatory Visit (HOSPITAL_BASED_OUTPATIENT_CLINIC_OR_DEPARTMENT_OTHER): Payer: Medicaid Other

## 2012-10-19 VITALS — BP 117/70 | HR 93 | Temp 98.0°F | Resp 20

## 2012-10-19 DIAGNOSIS — Z5189 Encounter for other specified aftercare: Secondary | ICD-10-CM

## 2012-10-19 DIAGNOSIS — C50319 Malignant neoplasm of lower-inner quadrant of unspecified female breast: Secondary | ICD-10-CM

## 2012-10-19 DIAGNOSIS — C50311 Malignant neoplasm of lower-inner quadrant of right female breast: Secondary | ICD-10-CM

## 2012-10-19 MED ORDER — FILGRASTIM 480 MCG/0.8ML IJ SOLN
480.0000 ug | Freq: Once | INTRAMUSCULAR | Status: AC
  Administered 2012-10-19: 480 ug via SUBCUTANEOUS
  Filled 2012-10-19: qty 0.8

## 2012-10-20 ENCOUNTER — Encounter: Payer: Self-pay | Admitting: Pharmacist

## 2012-10-23 ENCOUNTER — Ambulatory Visit (HOSPITAL_BASED_OUTPATIENT_CLINIC_OR_DEPARTMENT_OTHER): Payer: Medicaid Other

## 2012-10-23 ENCOUNTER — Other Ambulatory Visit (HOSPITAL_BASED_OUTPATIENT_CLINIC_OR_DEPARTMENT_OTHER): Payer: Medicaid Other | Admitting: Lab

## 2012-10-23 ENCOUNTER — Encounter: Payer: Self-pay | Admitting: Adult Health

## 2012-10-23 ENCOUNTER — Ambulatory Visit (HOSPITAL_BASED_OUTPATIENT_CLINIC_OR_DEPARTMENT_OTHER): Payer: Medicaid Other | Admitting: Adult Health

## 2012-10-23 VITALS — BP 114/71 | HR 98 | Temp 98.7°F | Resp 18 | Ht 63.0 in | Wt 121.5 lb

## 2012-10-23 DIAGNOSIS — C50319 Malignant neoplasm of lower-inner quadrant of unspecified female breast: Secondary | ICD-10-CM

## 2012-10-23 DIAGNOSIS — C50311 Malignant neoplasm of lower-inner quadrant of right female breast: Secondary | ICD-10-CM

## 2012-10-23 DIAGNOSIS — C50919 Malignant neoplasm of unspecified site of unspecified female breast: Secondary | ICD-10-CM

## 2012-10-23 DIAGNOSIS — G620 Drug-induced polyneuropathy: Secondary | ICD-10-CM

## 2012-10-23 DIAGNOSIS — Z5111 Encounter for antineoplastic chemotherapy: Secondary | ICD-10-CM

## 2012-10-23 DIAGNOSIS — Z171 Estrogen receptor negative status [ER-]: Secondary | ICD-10-CM

## 2012-10-23 LAB — CBC WITH DIFFERENTIAL/PLATELET
BASO%: 0.9 % (ref 0.0–2.0)
Basophils Absolute: 0.1 10*3/uL (ref 0.0–0.1)
Eosinophils Absolute: 0 10*3/uL (ref 0.0–0.5)
HCT: 25.3 % — ABNORMAL LOW (ref 34.8–46.6)
LYMPH%: 24.2 % (ref 14.0–49.7)
MCHC: 34.4 g/dL (ref 31.5–36.0)
MCV: 94.1 fL (ref 79.5–101.0)
MONO#: 0.6 10*3/uL (ref 0.1–0.9)
MONO%: 11.2 % (ref 0.0–14.0)
NEUT#: 3.4 10*3/uL (ref 1.5–6.5)
NEUT%: 63.7 % (ref 38.4–76.8)
Platelets: 192 10*3/uL (ref 145–400)
WBC: 5.4 10*3/uL (ref 3.9–10.3)

## 2012-10-23 LAB — COMPREHENSIVE METABOLIC PANEL (CC13)
Albumin: 3.1 g/dL — ABNORMAL LOW (ref 3.5–5.0)
BUN: 16.6 mg/dL (ref 7.0–26.0)
CO2: 29 mEq/L (ref 22–29)
Calcium: 8.9 mg/dL (ref 8.4–10.4)
Chloride: 99 mEq/L (ref 98–109)
Creatinine: 0.7 mg/dL (ref 0.6–1.1)
Total Protein: 6.6 g/dL (ref 6.4–8.3)

## 2012-10-23 MED ORDER — DEXAMETHASONE SODIUM PHOSPHATE 20 MG/5ML IJ SOLN
INTRAMUSCULAR | Status: AC
  Filled 2012-10-23: qty 5

## 2012-10-23 MED ORDER — SODIUM CHLORIDE 0.9 % IV SOLN
Freq: Once | INTRAVENOUS | Status: AC
  Administered 2012-10-23: 16:00:00 via INTRAVENOUS

## 2012-10-23 MED ORDER — ONDANSETRON 16 MG/50ML IVPB (CHCC)
16.0000 mg | Freq: Once | INTRAVENOUS | Status: AC
  Administered 2012-10-23: 16 mg via INTRAVENOUS

## 2012-10-23 MED ORDER — PACLITAXEL CHEMO INJECTION 300 MG/50ML
80.0000 mg/m2 | Freq: Once | INTRAVENOUS | Status: AC
  Administered 2012-10-23: 126 mg via INTRAVENOUS
  Filled 2012-10-23: qty 21

## 2012-10-23 MED ORDER — HEPARIN SOD (PORK) LOCK FLUSH 100 UNIT/ML IV SOLN
500.0000 [IU] | Freq: Once | INTRAVENOUS | Status: AC | PRN
  Administered 2012-10-23: 500 [IU]
  Filled 2012-10-23: qty 5

## 2012-10-23 MED ORDER — DIPHENHYDRAMINE HCL 50 MG/ML IJ SOLN
25.0000 mg | Freq: Once | INTRAMUSCULAR | Status: AC
  Administered 2012-10-23: 25 mg via INTRAVENOUS

## 2012-10-23 MED ORDER — ONDANSETRON 16 MG/50ML IVPB (CHCC)
INTRAVENOUS | Status: AC
  Filled 2012-10-23: qty 16

## 2012-10-23 MED ORDER — DEXAMETHASONE SODIUM PHOSPHATE 20 MG/5ML IJ SOLN
20.0000 mg | Freq: Once | INTRAMUSCULAR | Status: AC
  Administered 2012-10-23: 20 mg via INTRAVENOUS

## 2012-10-23 MED ORDER — SODIUM CHLORIDE 0.9 % IJ SOLN
10.0000 mL | INTRAMUSCULAR | Status: DC | PRN
  Administered 2012-10-23: 10 mL
  Filled 2012-10-23: qty 10

## 2012-10-23 MED ORDER — DIPHENHYDRAMINE HCL 50 MG/ML IJ SOLN
INTRAMUSCULAR | Status: AC
  Filled 2012-10-23: qty 1

## 2012-10-23 MED ORDER — SODIUM CHLORIDE 0.9 % IV SOLN
217.0000 mg | Freq: Once | INTRAVENOUS | Status: AC
  Administered 2012-10-23: 220 mg via INTRAVENOUS
  Filled 2012-10-23: qty 22

## 2012-10-23 MED ORDER — FAMOTIDINE IN NACL 20-0.9 MG/50ML-% IV SOLN
20.0000 mg | Freq: Once | INTRAVENOUS | Status: AC
  Administered 2012-10-23: 20 mg via INTRAVENOUS

## 2012-10-23 MED ORDER — FAMOTIDINE IN NACL 20-0.9 MG/50ML-% IV SOLN
INTRAVENOUS | Status: AC
  Filled 2012-10-23: qty 50

## 2012-10-23 NOTE — Patient Instructions (Signed)
Increase neurontin to 200mg  or (2 tablets) three times per day.  Please call us if you have any questions or concerns.

## 2012-10-23 NOTE — Progress Notes (Addendum)
OFFICE PROGRESS NOTE  CC  [name], MD [address] [place] [address] Dr. Everardo Beals Dr. 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 Patient had a palpable mass at the 3:00 position. She was seen at the breast Center on 06/20/2012 she had a mammogram and ultrasound showing a 1.7 cm mass at the 3:00 position. 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 Tumor was grade 3.  #2 MRIs performed on 06/26/2012 showed no biopsy mass at 3:00 but also 2 other adjacent masses at 1 at 2:00 measuring 0.8 x 1.6 x 2.2 cm andalso 6 mm ill-defined mass posterior to the biopsy-proven malignancy.  #3 patient has underwent Port-A-Cath placement in order to begin neoadjuvant chemotherapy initially consisting of Adriamycin Cytoxan to be given dose dense for 4 cycles.  She underwent this from 07/17/12 through 09/04/12 and it was followed by weekly Taxol and carboplatinum for a total of 12 weeks starting on 09/11/12.  CURRENT THERAPY: Taxol Carbo cycle 7  INTERVAL HISTORY: Adahlia R Borsuk 52 y.o. female returns for evaluation prior to Taxol/Carbo cycle 7. She is doing moderately well.  She continues to have numbness in her fingertips and toes that is unchanged, we did increase the dose last week to three times per day instead of two.  She is otherwise feeling well and denies fevers, chills, nausea, vomiting, constipation, diarrhea.    MEDICAL HISTORY: Past Medical History  Diagnosis Date  . HSV infection   . NSVD (normal spontaneous vaginal delivery)   . Hypertension     takes HCTZ  . Urinary frequency   . Anemia     takes Ferrous Sulfate occasionally  . Breast cancer 2014     triple negative    ALLERGIES:  has No Known Allergies.  MEDICATIONS:  Current Outpatient Prescriptions  Medication Sig Dispense Refill  . Alum & Mag Hydroxide-Simeth (MAGIC MOUTHWASH W/LIDOCAINE) SOLN Take 5 mL by mouth 4 (four) times daily. Swish and spit  500 mL  0  . dexamethasone (DECADRON) 4 MG tablet Take 1 tablet (4 mg total) by mouth 2 (two) times daily with a meal. Take 2 tabs every day on the day after chemo an then take 2 tabs twice a day with food for 2 days.  60 tablet  0  . ferrous sulfate 325 (65 FE) MG tablet Take 325 mg by mouth daily with breakfast.      . gabapentin (NEURONTIN) 100 MG capsule Take 1 capsule (100 mg total) by mouth 3 (three) times daily.  90 capsule  0  . hydrochlorothiazide (HYDRODIURIL) 25 MG tablet Take 1 tablet (25 mg total) by mouth daily.  30 tablet  11  . lidocaine-prilocaine (EMLA) cream Apply 1 application topically as needed (apply to PAC site 1-2 hours prior to treatment.).      Marland Kitchen LORazepam (ATIVAN) 0.5 MG tablet Take 0.5 mg by mouth every 6 (six) hours as needed for anxiety (as needed for nausea and vomitting).      . ondansetron (ZOFRAN) 8 MG tablet Take 8 mg by mouth every 8 (eight) hours as needed for nausea (Take 1 pill by mouth twice a day as needed  for nausea and vomitting starting 3rd day after chemo).      Marland Kitchen  oxyCODONE-acetaminophen (ROXICET) 5-325 MG per tablet Take 1-2 tablets by mouth every 4 (four) hours as needed for pain.  30 tablet  0  . prochlorperazine (COMPAZINE) 10 MG tablet Take 10 mg by mouth every 6 (six) hours as needed (for nausea and vomitting).      . prochlorperazine (COMPAZINE) 25 MG suppository Place 25 mg rectally every 12 (twelve) hours as needed for nausea (as needed for nausea and vomitting).      Marland Kitchen UNABLE TO FIND Apply 1 application topically once. 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  1 application  0  . valACYclovir (VALTREX) 500 MG tablet Take 500 mg by  mouth daily as needed. For herpes       No current facility-administered medications for this visit.   Facility-Administered Medications Ordered in Other Visits  Medication Dose Route Frequency Provider Last Rate Last Dose  . 0.9 %  sodium chloride infusion   Intravenous Once Victorino December, MD      . CARBOplatin (PARAPLATIN) 220 mg in sodium chloride 0.9 % 100 mL chemo infusion  220 mg Intravenous Once Victorino December, MD      . heparin lock flush 100 unit/mL  500 Units Intracatheter Once PRN Victorino December, MD      . PACLitaxel (TAXOL) 126 mg in dextrose 5 % 250 mL chemo infusion (</= 80mg /m2)  80 mg/m2 (Treatment Plan Actual) Intravenous Once Victorino December, MD 271 mL/hr at 10/23/12 1637 126 mg at 10/23/12 1637  . sodium chloride 0.9 % injection 10 mL  10 mL Intracatheter PRN Victorino December, MD        SURGICAL HISTORY:  Past Surgical History  Procedure Laterality Date  . Tubal ligation  2003    LTL  . Portacath placement N/A 07/06/2012    Procedure: INSERTION PORT-A-CATH;  Surgeon: Almond Lint, MD;  Location: [facility] OR;  Service: General;  Laterality: N/A;    REVIEW OF SYSTEMS: A 10 point review of systems was conducted and is otherwise negative except for what is noted above.     PHYSICAL EXAMINATION: Blood pressure 114/71, pulse 98, temperature 98.7 F (37.1 C), temperature source Oral, resp. rate 18, height 5\' 3"  (1.6 m), weight 121 lb 8 oz (55.112 kg). Body mass index is 21.53 kg/(m^2). General: Patient is a well appearing female in no acute distress HEENT: PERRLA, sclerae anicteric no conjunctival pallor, MMM Neck: supple, no palpable adenopathy Lungs: clear to auscultation bilaterally, no wheezes, rhonchi, or rales Cardiovascular: regular rate rhythm, S1, S2, no murmurs, rubs or gallops Abdomen: Soft, non-tender, non-distended, normoactive bowel sounds, no HSM Extremities: warm and well perfused, no clubbing, cyanosis, or edema Skin: No rashes or lesions Neuro:  Non-focal Breast examination: Left breast no masses or nipple discharge Right breast reveals 3 discrete masses at the 3:00 position 2:00 position.  It is nontender there is no nipple discharge. softer. ECOG PERFORMANCE STATUS: 0 - Asymptomatic  LABORATORY DATA: Lab Results  Component Value Date   WBC 5.4 10/23/2012   HGB 8.7* 10/23/2012   HCT 25.3* 10/23/2012   MCV 94.1 10/23/2012   PLT 192 10/23/2012      Chemistry      Component Value Date/Time   NA 138 10/23/2012 1332   NA 137 06/06/2012 0853   K 3.3* 10/23/2012 1332   K 3.8 06/06/2012 0853   CL 104 07/17/2012 1357   CL 102 06/06/2012 0853   CO2 29 10/23/2012 1332  CO2 26 06/06/2012 0853   BUN 16.6 10/23/2012 1332   BUN 12 06/06/2012 0853   CREATININE 0.7 10/23/2012 1332   CREATININE 0.70 06/06/2012 0853   CREATININE 0.85 04/24/2009 1114      Component Value Date/Time   CALCIUM 8.9 10/23/2012 1332   CALCIUM 9.3 06/06/2012 0853   ALKPHOS 64 10/23/2012 1332   ALKPHOS 35* 06/06/2012 0853   AST 20 10/23/2012 1332   AST 14 06/06/2012 0853   ALT 16 10/23/2012 1332   ALT 9 06/06/2012 0853   BILITOT 0.30 10/23/2012 1332   BILITOT 0.8 06/06/2012 0853     ADDITIONAL INFORMATION: PROGNOSTIC INDICATORS - ACIS Results: IMMUNOHISTOCHEMICAL AND MORPHOMETRIC ANALYSIS BY THE AUTOMATED CELLULAR IMAGING SYSTEM (ACIS) Estrogen Receptor: 0%, NEGATIVE Progesterone Receptor: 0%, NEGATIVE Proliferation Marker Ki67: 95% COMMENT: The negative hormone receptor study(ies) in this case have an internal positive control. REFERENCE RANGE ESTROGEN RECEPTOR NEGATIVE <1% POSITIVE =>1% PROGESTERONE RECEPTOR NEGATIVE <1% POSITIVE =>1% All controls stained appropriately Pecola Leisure MD Pathologist, Electronic Signature ( Signed 06/28/2012) CHROMOGENIC IN-SITU HYBRIDIZATION Results: HER-2/NEU BY CISH - NO AMPLIFICATION OF HER-2 DETECTED. RESULT RATIO OF HER2: CEP 17 SIGNALS 1.26 1 of 3 Duplicate copy FINAL for LEAHMARIE, GASIOROWSKI [phone]) ADDITIONAL  INFORMATION:(continued) AVERAGE HER2 COPY NUMBER PER CELL 2.45 REFERENCE RANGE NEGATIVE HER2/Chr17 Ratio <2.0 and Average HER2 copy number <4.0 EQUIVOCAL HER2/Chr17 Ratio <2.0 and Average HER2 copy number 4.0 and <6.0 POSITIVE HER2/Chr17 Ratio >=2.0 and/or Average HER2 copy number >=6.0 Jimmy Picket MD Pathologist, Electronic Signature ( Signed 06/26/2012) FINAL DIAGNOSIS Diagnosis Breast, right, needle core biopsy, 3:00 position - INVASIVE DUCTAL CARCINOMA SEE COMMENT. - DUCTAL CARCINOMA IN SITU Microscopic Comment Although the grade of tumor is best assessed at resection, with these biopsies, both the invasive and in situ carcinoma are grade III. Breast prognostic studies are pending and will be reported in an addendum. The case is reviewed with Dr. Raynald Blend who concurs. (CRR:gt, 06/21/12) [place] RUND DO Pathologist, Electronic FINAL DIAGNOSIS Diagnosis Breast, right, needle core biopsy, mass - BENIGN BREAST TISSUE WITH FOCAL PSEUDOANGIOMATOUS STROMAL HYPERPLASIA (PASH). NO EVIDENCE OF MALIGNANCY. Microscopic Comment Called to the [facility] on 07/13/12. (JDP:caf 07/13/12)  RADIOGRAPHIC STUDIES:  Dg Chest 2 View  07/04/2012   *RADIOLOGY REPORT*  Clinical Data: Insertion of Port-A-Cath.  Breast cancer. Hypertension.  CHEST - 2 VIEW  Comparison: CT of the chest on 07/03/2012  Findings: Cardiomediastinal silhouette is within normal limits. The lungs are free of focal consolidations and pleural effusions. Bony structures have a normal appearance.  Contrast is identified within the colon following CT of the abdomen.  IMPRESSION: Negative exam.   Original Report Authenticated By: Norva Pavlov, M.D.   Ct Chest W Contrast  07/03/2012   *RADIOLOGY REPORT*  Clinical Data:  Breast cancer.  No therapy yet.  Lower inner quadrant of right breast.  CT CHEST, ABDOMEN AND PELVIS WITH CONTRAST  Technique: Contiguous axial images of the chest abdomen and pelvis were obtained after IV  contrast administration.  Contrast: 80 ml Omnipaque-300  Comparison: Breast MR of 06/26/2012.  No prior CTs.  CT CHEST  Findings: Lung windows demonstrate minimal left lower lobe subpleural nodularity at 3 mm on image 40/series 4.  Right lung clear.  Soft tissue windows demonstrate small bilateral axillary nodes, without adenopathy.  No subpectoral adenopathy. No supraclavicular adenopathy.  Normal heart size, without pericardial effusion.  No central pulmonary embolism, on this non-dedicated study.  No mediastinal or hilar adenopathy.  No internal mammary adenopathy. Minimal residual  thymic tissue in the anterior mediastinum (image 17/series 2).  IMPRESSION:  1. No acute process or evidence of metastatic disease in the chest. 2.  Probable subpleural lymph node in the left lower lobe. Recommend attention on follow-up.  CT ABDOMEN AND PELVIS  Findings:  Mild early phase of hepatic enhancement which decreases sensitivity for focal liver lesions.  None identified.  Normal spleen, stomach, pancreas, gallbladder, biliary tract, adrenal glands, kidneys. No retroperitoneal or retrocrural adenopathy.  Normal colon, appendix, and terminal ileum.  Normal small bowel without abdominal ascites.  No pelvic adenopathy.    Normal urinary bladder and uterus.  No adnexal mass.  Trace free pelvic fluid is likely physiologic.  Transitional right-sided lumbosacral anatomy.  Degenerative disc disease at the lumbosacral junction.  IMPRESSION:  No acute process or evidence of metastatic disease in the abdomen or pelvis.   Original Report Authenticated By: Jeronimo Greaves, M.D.   US Breast Right  06/29/2012   *RADIOLOGY REPORT*  Clinical Data:  52 year old female with newly diagnosed right breast cancer.  Suspicious abnormal enhancement anterior and superior to the biopsy-proven neoplasm - for second look ultrasound evaluation.  RIGHT BREAST ULTRASOUND  Comparison:  Prior mammograms, ultrasounds and MRI.  Findings: Ultrasound is performed,  showing the biopsy-proven neoplasm at the 3 o'clock position of the right breast 4 cm from the nipple. No other discrete sonographic abnormalities are identified, specifically medial, superior, and anterior to the biopsy-proven neoplasm.  IMPRESSION: Patchy abnormal enhancement medial superior to the biopsy-proven neoplasm identified on recent MRI is not visualized sonographically as a discrete abnormality.  MR guided biopsy is recommended if breast conservation surgery is considered.  BI-RADS CATEGORY 6:  Known biopsy-proven malignancy - appropriate action should be taken.  RECOMMENDATION: MR guided right breast biopsy, which will be scheduled by our office.  I have discussed the findings and recommendations with the patient. Results were also provided in writing at the conclusion of the visit.  If applicable, a reminder letter will be sent to the patient regarding the next appointment.   Original Report Authenticated By: Harmon Pier, M.D.   US Breast Right  06/20/2012   *RADIOLOGY REPORT*  Clinical Data:  52 year old female for annual bilateral mammograms and palpable mass in the upper and inner right breast discovered on clinical examination.  DIGITAL DIAGNOSTIC BILATERAL MAMMOGRAM WITH CAD AND RIGHT BREAST ULTRASOUND:  Comparison:  03/21/2009 and prior mammograms dating back to 01/21/2005  Findings:  ACR Breast Density Category 4: The breast tissue is extremely dense.  Routine and spot compression views of the right breast demonstrate no evidence of suspicious mass or distortion. Scattered punctate calcifications within the breasts bilaterally are again identified.  Mammographic images were processed with CAD.  On physical exam, thickening is identified at the 12 o'clock position of the right breast 4 cm from the nipple. A firm palpable mass identified at the 3 o'clock position of the right breast 4 cm from the nipple.  Ultrasound is performed, showing no evidence of solid or cystic mass, distortion or  abnormal areas of shadowing at the 12 o'clock position of the right breast. A 7 x 6 x 17 mm slightly irregular oval hypoechoic mass with calcifications is horizontally oriented at the 3 o'clock position of the right breast 4 cm from the nipple.  IMPRESSION: Indeterminate 7 x 6 x 17 mm hypoechoic mass at the 3 o'clock position of the right breast.  Tissue sampling is recommended.  No mammographic evidence of breast malignancy bilaterally.  No mammographic, palpable  or sonographic abnormality at the 12 o'clock position of the right breast, in the area of palpable concern.  BI-RADS CATEGORY 4:  Suspicious abnormality - biopsy should be considered.  RECOMMENDATION:  Ultrasound guided right breast biopsy, which will be performed today but dictated in a separate report.  I have discussed the findings and recommendations with the patient. Results were also provided in writing at the conclusion of the visit.  If applicable, a reminder letter will be sent to the patient regarding the next appointment.   Original Report Authenticated By: Harmon Pier, M.D.   Ct Abdomen Pelvis W Contrast  07/03/2012   *RADIOLOGY REPORT*  Clinical Data:  Breast cancer.  No therapy yet.  Lower inner quadrant of right breast.  CT CHEST, ABDOMEN AND PELVIS WITH CONTRAST  Technique: Contiguous axial images of the chest abdomen and pelvis were obtained after IV contrast administration.  Contrast: 80 ml Omnipaque-300  Comparison: Breast MR of 06/26/2012.  No prior CTs.  CT CHEST  Findings: Lung windows demonstrate minimal left lower lobe subpleural nodularity at 3 mm on image 40/series 4.  Right lung clear.  Soft tissue windows demonstrate small bilateral axillary nodes, without adenopathy.  No subpectoral adenopathy. No supraclavicular adenopathy.  Normal heart size, without pericardial effusion.  No central pulmonary embolism, on this non-dedicated study.  No mediastinal or hilar adenopathy.  No internal mammary adenopathy. Minimal residual thymic  tissue in the anterior mediastinum (image 17/series 2).  IMPRESSION:  1. No acute process or evidence of metastatic disease in the chest. 2.  Probable subpleural lymph node in the left lower lobe. Recommend attention on follow-up.  CT ABDOMEN AND PELVIS  Findings:  Mild early phase of hepatic enhancement which decreases sensitivity for focal liver lesions.  None identified.  Normal spleen, stomach, pancreas, gallbladder, biliary tract, adrenal glands, kidneys. No retroperitoneal or retrocrural adenopathy.  Normal colon, appendix, and terminal ileum.  Normal small bowel without abdominal ascites.  No pelvic adenopathy.    Normal urinary bladder and uterus.  No adnexal mass.  Trace free pelvic fluid is likely physiologic.  Transitional right-sided lumbosacral anatomy.  Degenerative disc disease at the lumbosacral junction.  IMPRESSION:  No acute process or evidence of metastatic disease in the abdomen or pelvis.   Original Report Authenticated By: Jeronimo Greaves, M.D.   Mr Breast Right W Wo Contrast  07/12/2012   *RADIOLOGY REPORT*  Clinical Data:  Known invasive mammary carcinoma in the right breast at 3 o'clock posteriorly.  Additional site of abnormal enhancement in the right upper inner quadrant closer to the nipple for possible biopsy.  ATTEMPTED MRI GUIDED VACUUM ASSISTED BIOPSY OF THE RIGHT BREAST WITHOUT AND WITH CONTRAST  Comparison: Previous exams.  Technique: Multiplanar, multisequence MR images of the right breast were obtained prior to and following the intravenous administration of 9 ml of Mulithance.  I met with the patient, and we discussed the procedure of MRI guided biopsy, including risks, benefits, and alternatives. Specifically, we discussed the risks of infection, bleeding, tissue injury, clip migration, and inadequate sampling.  Informed, written consent was given.  Using sterile technique, 2% Lidocaine, MRI guidance, and a 9 gauge vacuum assisted device, biopsy was attempted using a  mediolateral approach.  However, the procedure was unsuccessful due to the extremely thin breast compression.  IMPRESSION:  Unsuccessful MRI guided vacuum-assisted biopsy of the right breast.  Patient was sent to the [facility] for further evaluation with ultrasound.   Original Report Authenticated By: Cain Saupe, M.D.  Mr Breast Bilateral W Wo Contrast  06/26/2012   *RADIOLOGY REPORT*  Clinical Data: History of new diagnosis of breast cancer from recent ultrasound guided core needle biopsy right breast 06/20/2012 demonstrating invasive ductal carcinoma with DCIS.  GFR 89, creatinine 0.7 on 06/06/2012.  BILATERAL BREAST MRI WITH AND WITHOUT CONTRAST  Technique: Multiplanar, multisequence MR images of both breasts were obtained prior to and following the intravenous administration of 10ml of multihance.  Three dimensional images were evaluated at the independent DynaCad workstation.  Comparison:  Recent mammograms and ultrasound.  Findings: Examination demonstrates mild background parenchymal enhancement.  Right breast:  There is evidence of an ovoid gently lobulated and somewhat ill-defined heterogeneously enhancing mass over the deep third of the inner midportion of the right breast representing patient's biopsy-proven malignancy.  This mass contains central clip artifact and measures approximately 0.9 x 1.9 x 1.0 cm. The anterior border of this mass is located 5.5 cm from the nipple. There is a 6 mm slightly ill-defined enhancing ovoid mass 6 mm posterior lateral to the biopsy-proven malignancy lying just superficial to the pectoralis muscle.  There is also clumped non mass enhancement anterior and slightly superior to the biopsy- proven malignancy in the middle third at approximately the 2 o'clock position.  This measures approximately 0.8 x 1.6 x 1.2 cm and is located 3 cm from the nipple.  This is suspicious for multifocal disease.  No suspicious axillary or internal mammary lymph nodes.  Left  breast:  No suspicious mass, enhancement or adenopathy.  IMPRESSION: Known biopsy-proven malignancy over the deep third of the inner mid right breast.  6 mm ill-defined enhancing ovoid mass 6 mm posterior lateral to the biopsy-proven malignancy and lying just superficial to the pectoralis muscle.  Clumped non mass enhancement anterior and superior to patient's known malignancy at approximately the 2 o'clock position measuring 0.8 x 1.6 x 2.2 cm.  Findings likely represent multi focal disease.  RECOMMENDATION: Recommend second look ultrasound and core biopsy if feasible of the clumped non mass enhancement at approximately the 2 o'clock position right breast anterior/superior to the known malignancy. If not seen by ultrasound, recommend proceeding to MR guided biopsy of this suspicious abnormality.  THREE-DIMENSIONAL MR IMAGE RENDERING ON INDEPENDENT WORKSTATION:  Three-dimensional MR images were rendered by post-processing of the original MR data on an independent workstation.  The three- dimensional MR images were interpreted, and findings were reported in the accompanying complete MRI report for this study.  BI-RADS CATEGORY 0:  Incomplete.  Need additional imaging evaluation and/or prior mammograms for comparison.   Original Report Authenticated By: Elberta Fortis, M.D.   Dg Chest Port 1 View  07/06/2012   *RADIOLOGY REPORT*  Clinical Data: Post left-sided port catheter insertion  PORTABLE CHEST - 1 VIEW  Comparison: 07/04/2012  Findings:  Grossly unchanged cardiac silhouette and mediastinal contours. Interval placement of a left anterior chest wall subclavian vein approach port-a-catheter with tip projecting over the superior cavoatrial junction. Grossly unchanged symmetric biapical pleural parenchymal thickening.  No pneumothorax.  No focal airspace opacities.  No pleural effusion.  No evidence of edema.  Unchanged bones.  IMPRESSION: Interval placement of a left anterior chest wall subclavian vein approach  port-a-catheter with tip overlying the superior cavoatrial junction.  No pneumothorax.   Original Report Authenticated By: Tacey Ruiz, MD   Mm Digital Diagnostic Bilat  06/20/2012   *RADIOLOGY REPORT*  Clinical Data:  52 year old female for annual bilateral mammograms and palpable mass in the upper and inner right  breast discovered on clinical examination.  DIGITAL DIAGNOSTIC BILATERAL MAMMOGRAM WITH CAD AND RIGHT BREAST ULTRASOUND:  Comparison:  03/21/2009 and prior mammograms dating back to 01/21/2005  Findings:  ACR Breast Density Category 4: The breast tissue is extremely dense.  Routine and spot compression views of the right breast demonstrate no evidence of suspicious mass or distortion. Scattered punctate calcifications within the breasts bilaterally are again identified.  Mammographic images were processed with CAD.  On physical exam, thickening is identified at the 12 o'clock position of the right breast 4 cm from the nipple. A firm palpable mass identified at the 3 o'clock position of the right breast 4 cm from the nipple.  Ultrasound is performed, showing no evidence of solid or cystic mass, distortion or abnormal areas of shadowing at the 12 o'clock position of the right breast. A 7 x 6 x 17 mm slightly irregular oval hypoechoic mass with calcifications is horizontally oriented at the 3 o'clock position of the right breast 4 cm from the nipple.  IMPRESSION: Indeterminate 7 x 6 x 17 mm hypoechoic mass at the 3 o'clock position of the right breast.  Tissue sampling is recommended.  No mammographic evidence of breast malignancy bilaterally.  No mammographic, palpable or sonographic abnormality at the 12 o'clock position of the right breast, in the area of palpable concern.  BI-RADS CATEGORY 4:  Suspicious abnormality - biopsy should be considered.  RECOMMENDATION:  Ultrasound guided right breast biopsy, which will be performed today but dictated in a separate report.  I have discussed the findings and  recommendations with the patient. Results were also provided in writing at the conclusion of the visit.  If applicable, a reminder letter will be sent to the patient regarding the next appointment.   Original Report Authenticated By: Harmon Pier, M.D.   Mm Digital Diagnostic Unilat R  07/12/2012   *RADIOLOGY REPORT*  Clinical Data:  Ultrasound-guided core needle biopsy of a hypoechoic area at 2 o'clock 2 cm from the right nipple with clip placement.  Known invasive mammary carcinoma in the right breast. Unsuccessful right breast MRI guided biopsy earlier today.  DIGITAL DIAGNOSTIC RIGHT MAMMOGRAM  Comparison:  Previous exams.  Findings:  Films are performed following ultrasound guided biopsy of a small hypoechoic area at 2 o'clock 2 cm from the right nipple. The ribbon clip is positioned within the area of concern. The ribbon clip is approximately 2.6 cm anterior to the top hat clip from the original biopsy on 06/20/2012.  IMPRESSION: Appropriate clip placement following ultrasound-guided core needle biopsy of a hypoechoic area at 2 o'clock 2 cm in the right nipple.   Original Report Authenticated By: Cain Saupe, M.D.   Mm Digital Diagnostic Unilat R  06/20/2012   *RADIOLOGY REPORT*  Clinical Data:  52 year old female - evaluate clip placement following ultrasound guided right breast biopsy.  DIGITAL DIAGNOSTIC RIGHT MAMMOGRAM  Comparison:  Previous exams.  Findings:  Films are performed following ultrasound guided biopsy of the 7 x 6 x 17 mm hypoechoic mass at the 3 o'clock position of the right breast 4 cm from the nipple. The T shaped biopsy clip is in satisfactory position.  IMPRESSION: Satisfactory clip placement following ultrasound guided right breast biopsy.  Pathology will be followed.   Original Report Authenticated By: Harmon Pier, M.D.   Dg Fluoro Guide Cv Line-no Report  07/06/2012   CLINICAL DATA: port placement   FLOURO GUIDE CV LINE  Fluoroscopy was utilized by the requesting physician.   No radiographic  interpretation.    [place] Rt Breast Bx W Loc Dev 1st Lesion Img Bx Spec US Guide  07/13/2012   **ADDENDUM** CREATED: 07/13/2012 14:11:45  I spoke with the patient by telephone on 07/13/2012 to discuss pathology results.  Pathology demonstrates  pseudoangiomatous stromal hyperplasia.  The findings were called to the office of Dr. Donell Beers. Although the imaging findings could be related to pseudoangiomatous stromal hyperplasia, excision of the anterior biopsy site is suggested at the time of patient's definitive surgery.  The patient is considering unilateral or bilateral mastectomy at this time.  The patient reports no problems at the biopsy site.  All questions were answered.  Recommendations:  Excision of the biopsy site. The patient is scheduled to begin chemotherapy in 4 days.  **END ADDENDUM** SIGNED BY: Blair Hailey. Manson Passey, M.D.  07/12/2012   *RADIOLOGY REPORT*  Clinical Data:  Unsuccessful MRI guided biopsy of the area of abnormal enhancement in the left anterior upper inner quadrant, anterior to the known invasive mammary carcinoma.  Second look ultrasound performed and a 5 x 4 x 4 mm hypoechoic area found at 2 o'clock 2 cm from the right nipple which will be biopsied.  ULTRASOUND GUIDED VACUUM ASSISTED CORE BIOPSY OF THE RIGHT BREAST  Sonography of the right upper inner quadrant was performed.  There is an ill-defined hypoechoic 5 x 4 x 4 mm area at 2 o'clock 2 cm from the right nipple which will be biopsied with ultrasound guidance.  The patient and I discussed the procedure of ultrasound-guided biopsy, including benefits and alternatives.  We discussed the high likelihood of a successful procedure. We discussed the risks of the procedure including infection, bleeding, tissue injury, clip migration, and inadequate sampling.  Written informed consent was given.  Using sterile technique, 2% lidocaine, ultrasound guidance, and a 12 gauge vacuum assisted needle, biopsy was performed of the  hypoechoic area at 2 o'clock 2 cm from the right nipple using a caudocranial approach.  At the conclusion of the procedure, a within tissue marker clip was deployed into the biopsy cavity. Follow-up 2-view mammogram was performed and dictated separately.  IMPRESSION: Ultrasound-guided biopsy of a 5 x 4 x 4 mm hypoechoic area at 2 o'clock 2 cm from the right nipple.  No apparent complications.   Original Report Authenticated By: Cain Saupe, M.D.   [place] Rt Breast Bx W Loc Dev 1st Lesion Img Bx Spec US Guide  06/21/2012   *RADIOLOGY REPORT*  Clinical Data:  Indeterminate 7 x 6 x 17 mm hypoechoic mass in the inner right breast - for tissue sampling.  ULTRASOUND GUIDED VACUUM ASSISTED CORE BIOPSY OF THE RIGHT BREAST  Comparison: Previous exams.  I met with the patient and we discussed the procedure of ultrasound- guided biopsy, including benefits and alternatives.  We discussed the high likelihood of a successful procedure. We discussed the risks of the procedure including infection, bleeding, tissue injury, clip migration, and inadequate sampling.  Informed written consent was given.  Using sterile technique, 2% lidocaine ultrasound guidance and a 12 gauge vacuum assisted needle biopsy was performed of the 7 x 6 x 17 mm hypoechoic mass at the 3 o'clock position of the right breast 4 cm from the nipple using a lateral approach.  At the conclusion of the procedure, a T shaped tissue marker clip was deployed into the biopsy cavity.  Follow-up 2-view mammogram was performed and dictated separately.  IMPRESSION: Ultrasound-guided biopsy of right breast mass.  No apparent complications.  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 Histology correlates with imaging findings.  The patient was contacted by phone on 06/21/2012 and these results given to her which she understood. Her questions were answered. She was encouraged to obtain breast cancer educational material from [facility] at her  convenience. The patient had no complaints with her biopsy site.  Recommend surgery/oncology consultation.  An appointment at the [facility] has been scheduled for 06/28/2012. Recommend bilateral breast MRI, which has been scheduled for 06/26/2012. The patient was informed of these appointments.   Original Report Authenticated By: Harmon Pier, M.D.    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 Patient had other areas seen on MRI biopsy of the second mass was negative. Patient has had her Port-A-Cath placed. She is proceeding neoadjuvant chemotherapy to possibly conserve her breast. Chemotherapy will consist of Adriamycin Cytoxan given dose dense for 4 cycles starting on 07/17/2012 with Neulasta support.  This was followed by Taxol/Carbo which started on 09/11/12.    #2 we discussed risks benefits and side effects of chemotherapy. She has had an echocardiogram as well as chemotherapy teaching class. Port-A-Cath was placed by Dr. Donell Beers. The port is functioning well.  #3 Taxol/Carbo cycle 7 today  #4 vaginal herpes outbreak  #5 neuropathy  PLAN:  #1  Doing well.  Labs are stable.  Patients WBC improved with Neupogen x 3 days.  Patient will proceed with cycle 7 taxol/carbo today.    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  #3 patient will return in one week's time for cycle 8 of Taxol/ carboplatinum.  All questions were answered. The patient knows to call the clinic with any problems, questions or concerns. We can certainly see the patient much sooner if necessary.  I spent 25 minutes counseling the patient face to face. The total time spent in the appointment was 30 minutes.  Cherie Ouch Lyn Hollingshead, NP Medical Oncology [facility] Phone: [phone]    10/23/2012, 5:08  PM    ATTENDING'S ATTESTATION:  I personally reviewed patient's chart, examined patient myself, formulated the treatment plan as followed.    Patient's blood count has improved to after receiving Neupogen injections. She therefore today we will proceed with scheduled Taxol carboplatinum. I have informed the patient that she may periodically require Neupogen injections to try to keep her on time. She understands the implications.  The patient will be seen back in one week's time for followup and next cycle of chemotherapy.  Drue Second, MD Medical/Oncology [facility] [phone] (beeper) [phone] (Office)

## 2012-10-23 NOTE — Patient Instructions (Addendum)
[facility] Discharge Instructions for Patients Receiving Chemotherapy  Today you received the following chemotherapy agents taxol and carboplatin.  To help prevent nausea and vomiting after your treatment, we encourage you to take your nausea medication.   If you develop nausea and vomiting that is not controlled by your nausea medication, call the clinic.   BELOW ARE SYMPTOMS THAT SHOULD BE REPORTED IMMEDIATELY:  *FEVER GREATER THAN 100.5 F  *CHILLS WITH OR WITHOUT FEVER  NAUSEA AND VOMITING THAT IS NOT CONTROLLED WITH YOUR NAUSEA MEDICATION  *UNUSUAL SHORTNESS OF BREATH  *UNUSUAL BRUISING OR BLEEDING  TENDERNESS IN MOUTH AND THROAT WITH OR WITHOUT PRESENCE OF ULCERS  *URINARY PROBLEMS  *BOWEL PROBLEMS  UNUSUAL RASH Items with * indicate a potential emergency and should be followed up as soon as possible.  Feel free to call the clinic you have any questions or concerns. The clinic phone number is [phone].

## 2012-10-30 ENCOUNTER — Ambulatory Visit (HOSPITAL_COMMUNITY)
Admission: RE | Admit: 2012-10-30 | Discharge: 2012-10-30 | Disposition: A | Discharge status: Home / Self Care | Payer: Medicaid Other | Source: Ambulatory Visit | Attending: Oncology | Admitting: Oncology

## 2012-10-30 ENCOUNTER — Telehealth: Payer: Self-pay | Admitting: *Deleted

## 2012-10-30 ENCOUNTER — Other Ambulatory Visit (HOSPITAL_BASED_OUTPATIENT_CLINIC_OR_DEPARTMENT_OTHER): Payer: Medicaid Other

## 2012-10-30 ENCOUNTER — Ambulatory Visit (HOSPITAL_BASED_OUTPATIENT_CLINIC_OR_DEPARTMENT_OTHER): Payer: Medicaid Other | Admitting: Adult Health

## 2012-10-30 ENCOUNTER — Ambulatory Visit: Payer: Medicaid Other

## 2012-10-30 ENCOUNTER — Encounter: Payer: Self-pay | Admitting: Adult Health

## 2012-10-30 ENCOUNTER — Telehealth: Payer: Self-pay | Admitting: Oncology

## 2012-10-30 VITALS — BP 114/78 | HR 98 | Temp 98.1°F | Resp 18 | Ht 63.0 in | Wt 125.3 lb

## 2012-10-30 DIAGNOSIS — C50311 Malignant neoplasm of lower-inner quadrant of right female breast: Secondary | ICD-10-CM

## 2012-10-30 DIAGNOSIS — G620 Drug-induced polyneuropathy: Secondary | ICD-10-CM

## 2012-10-30 DIAGNOSIS — D6481 Anemia due to antineoplastic chemotherapy: Secondary | ICD-10-CM

## 2012-10-30 DIAGNOSIS — Z171 Estrogen receptor negative status [ER-]: Secondary | ICD-10-CM

## 2012-10-30 DIAGNOSIS — C50319 Malignant neoplasm of lower-inner quadrant of unspecified female breast: Secondary | ICD-10-CM

## 2012-10-30 DIAGNOSIS — A6000 Herpesviral infection of urogenital system, unspecified: Secondary | ICD-10-CM

## 2012-10-30 DIAGNOSIS — T451X5A Adverse effect of antineoplastic and immunosuppressive drugs, initial encounter: Secondary | ICD-10-CM

## 2012-10-30 LAB — CBC WITH DIFFERENTIAL/PLATELET
Basophils Absolute: 0 10*3/uL (ref 0.0–0.1)
Eosinophils Absolute: 0 10*3/uL (ref 0.0–0.5)
HCT: 22.3 % — ABNORMAL LOW (ref 34.8–46.6)
HGB: 7.4 g/dL — ABNORMAL LOW (ref 11.6–15.9)
LYMPH%: 23.1 % (ref 14.0–49.7)
MCH: 32.3 pg (ref 25.1–34.0)
MCV: 97.4 fL (ref 79.5–101.0)
MONO#: 0.2 10*3/uL (ref 0.1–0.9)
MONO%: 5.1 % (ref 0.0–14.0)
NEUT#: 2.5 10*3/uL (ref 1.5–6.5)
NEUT%: 71.2 % (ref 38.4–76.8)
Platelets: 205 10*3/uL (ref 145–400)

## 2012-10-30 LAB — COMPREHENSIVE METABOLIC PANEL (CC13)
Albumin: 3.5 g/dL (ref 3.5–5.0)
Alkaline Phosphatase: 52 U/L (ref 40–150)
BUN: 17.5 mg/dL (ref 7.0–26.0)
CO2: 30 mEq/L — ABNORMAL HIGH (ref 22–29)
Chloride: 102 mEq/L (ref 98–109)
Glucose: 87 mg/dl (ref 70–140)
Potassium: 3.5 mEq/L (ref 3.5–5.1)
Total Bilirubin: 0.29 mg/dL (ref 0.20–1.20)

## 2012-10-30 NOTE — Telephone Encounter (Signed)
Per staff message and POF I have scheduled appts.  JMW  

## 2012-10-30 NOTE — Patient Instructions (Addendum)
Take Gabapentin/Neurontin 300mg  three times per day.  You will receive blood transfusion tomorrow.    Blood Products Information This is information about transfusions of blood products. All blood that is to be transfused is tested for blood type, compatibility with the recipient, and for infections. Except in emergencies, giving a transfusion requires a written consent. Blood transfusions are often given as packed red blood cells. This means the other parts of the blood have been taken out. Blood may be needed to treat severe anemia or bleeding. Other blood products include plasma, platelets, immune globulin, and cryoprecipitate. Blood for transfusion is mostly donated by volunteers. The blood donors are carefully screened for risk factors that could cause disease. Donors are all tested for infections that could be transmitted by blood. The blood product supply today is the safest it has ever been. Some risks do remain.  A minor reaction with fever, chills, or rash happens in about 1% of blood product transfusions.  Life-threatening reactions occur in less than 1 in a million transfusions.  Infection with germs (bacteria), viruses or parasites like malaria can still happen. The risk is very low.  Hepatitis B occurs in about 1 case in 150,000 transfusions.  Hepatitis C is seen once in 500,000.  HIV is transmitted less than once every million transfusions. When you receive a transfusion of packed red blood cells, your blood is tested for blood group and Rh type. Your blood is also screened for antibodies that could cause a serious reaction. A cross-match test is done to make sure the blood is safe to give.  Talk with your caregiver if you have any concerns about receiving a transfusion of blood products. Make sure your questions are answered. Transfusions are not given if your caregiver feels the risk is greater than the need. Document Released: 01/11/2005 Document Revised: 04/05/2011 Document  Reviewed: 07/01/2006 [facility] Patient Information 2014 [place], [place].  Gemcitabine injection What is this medicine? GEMCITABINE (jem SIT a been) is a chemotherapy drug. This medicine is used to treat many types of cancer like breast cancer, lung cancer, pancreatic cancer, and ovarian cancer. This medicine may be used for other purposes; ask your health care provider or pharmacist if you have questions. What should I tell my health care provider before I take this medicine? They need to know if you have any of these conditions: -blood disorders -infection -kidney disease -liver disease -recent or ongoing radiation therapy -an unusual or allergic reaction to gemcitabine, other chemotherapy, other medicines, foods, dyes, or preservatives -pregnant or trying to get pregnant -breast-feeding How should I use this medicine? This drug is given as an infusion into a vein. It is administered in a hospital or clinic by a specially trained health care professional. Talk to your pediatrician regarding the use of this medicine in children. Special care may be needed. Overdosage: If you think you have taken too much of this medicine contact a poison control center or emergency room at once. NOTE: This medicine is only for you. Do not share this medicine with others. What if I miss a dose? It is important not to miss your dose. Call your doctor or health care professional if you are unable to keep an appointment. What may interact with this medicine? -medicines to increase blood counts like filgrastim, pegfilgrastim, sargramostim -some other chemotherapy drugs like cisplatin -vaccines Talk to your doctor or health care professional before taking any of these medicines: -acetaminophen -aspirin -ibuprofen -ketoprofen -naproxen This list may not describe all possible interactions.  Give your health care provider a list of all the medicines, herbs, non-prescription drugs, or dietary supplements you  use. Also tell them if you smoke, drink alcohol, or use illegal drugs. Some items may interact with your medicine. What should I watch for while using this medicine? Visit your doctor for checks on your progress. This drug may make you feel generally unwell. This is not uncommon, as chemotherapy can affect healthy cells as well as cancer cells. Report any side effects. Continue your course of treatment even though you feel ill unless your doctor tells you to stop. In some cases, you may be given additional medicines to help with side effects. Follow all directions for their use. Call your doctor or health care professional for advice if you get a fever, chills or sore throat, or other symptoms of a cold or flu. Do not treat yourself. This drug decreases your body's ability to fight infections. Try to avoid being around people who are sick. This medicine may increase your risk to bruise or bleed. Call your doctor or health care professional if you notice any unusual bleeding. Be careful brushing and flossing your teeth or using a toothpick because you may get an infection or bleed more easily. If you have any dental work done, tell your dentist you are receiving this medicine. Avoid taking products that contain aspirin, acetaminophen, ibuprofen, naproxen, or ketoprofen unless instructed by your doctor. These medicines may hide a fever. Women should inform their doctor if they wish to become pregnant or think they might be pregnant. There is a potential for serious side effects to an unborn child. Talk to your health care professional or pharmacist for more information. Do not breast-feed an infant while taking this medicine. What side effects may I notice from receiving this medicine? Side effects that you should report to your doctor or health care professional as soon as possible: -allergic reactions like skin rash, itching or hives, swelling of the face, lips, or tongue -low blood counts - this  medicine may decrease the number of white blood cells, red blood cells and platelets. You may be at increased risk for infections and bleeding. -signs of infection - fever or chills, cough, sore throat, pain or difficulty passing urine -signs of decreased platelets or bleeding - bruising, pinpoint red spots on the skin, black, tarry stools, blood in the urine -signs of decreased red blood cells - unusually weak or tired, fainting spells, lightheadedness -breathing problems -chest pain -mouth sores -nausea and vomiting -pain, swelling, redness at site where injected -pain, tingling, numbness in the hands or feet -stomach pain -swelling of ankles, feet, hands -unusual bleeding Side effects that usually do not require medical attention (report to your doctor or health care professional if they continue or are bothersome): -constipation -diarrhea -hair loss -loss of appetite -stomach upset This list may not describe all possible side effects. Call your doctor for medical advice about side effects. You may report side effects to FDA at 1-800-FDA-1088. Where should I keep my medicine? This drug is given in a hospital or clinic and will not be stored at home. NOTE: This sheet is a summary. It may not cover all possible information. If you have questions about this medicine, talk to your doctor, pharmacist, or health care provider.  2013, [employer]. (05/23/2007 6:45:54 PM) Carboplatin injection What is this medicine? CARBOPLATIN (KAR boe pla tin) is a chemotherapy drug. It targets fast dividing cells, like cancer cells, and causes these cells to die. This  medicine is used to treat ovarian cancer and many other cancers. This medicine may be used for other purposes; ask your health care provider or pharmacist if you have questions. What should I tell my health care provider before I take this medicine? They need to know if you have any of these conditions: -blood disorders -hearing  problems -kidney disease -recent or ongoing radiation therapy -an unusual or allergic reaction to carboplatin, cisplatin, other chemotherapy, other medicines, foods, dyes, or preservatives -pregnant or trying to get pregnant -breast-feeding How should I use this medicine? This drug is usually given as an infusion into a vein. It is administered in a hospital or clinic by a specially trained health care professional. Talk to your pediatrician regarding the use of this medicine in children. Special care may be needed. Overdosage: If you think you have taken too much of this medicine contact a poison control center or emergency room at once. NOTE: This medicine is only for you. Do not share this medicine with others. What if I miss a dose? It is important not to miss a dose. Call your doctor or health care professional if you are unable to keep an appointment. What may interact with this medicine? -medicines for seizures -medicines to increase blood counts like filgrastim, pegfilgrastim, sargramostim -some antibiotics like amikacin, gentamicin, neomycin, streptomycin, tobramycin -vaccines Talk to your doctor or health care professional before taking any of these medicines: -acetaminophen -aspirin -ibuprofen -ketoprofen -naproxen This list may not describe all possible interactions. Give your health care provider a list of all the medicines, herbs, non-prescription drugs, or dietary supplements you use. Also tell them if you smoke, drink alcohol, or use illegal drugs. Some items may interact with your medicine. What should I watch for while using this medicine? Your condition will be monitored carefully while you are receiving this medicine. You will need important blood work done while you are taking this medicine. This drug may make you feel generally unwell. This is not uncommon, as chemotherapy can affect healthy cells as well as cancer cells. Report any side effects. Continue your course  of treatment even though you feel ill unless your doctor tells you to stop. In some cases, you may be given additional medicines to help with side effects. Follow all directions for their use. Call your doctor or health care professional for advice if you get a fever, chills or sore throat, or other symptoms of a cold or flu. Do not treat yourself. This drug decreases your body's ability to fight infections. Try to avoid being around people who are sick. This medicine may increase your risk to bruise or bleed. Call your doctor or health care professional if you notice any unusual bleeding. Be careful brushing and flossing your teeth or using a toothpick because you may get an infection or bleed more easily. If you have any dental work done, tell your dentist you are receiving this medicine. Avoid taking products that contain aspirin, acetaminophen, ibuprofen, naproxen, or ketoprofen unless instructed by your doctor. These medicines may hide a fever. Do not become pregnant while taking this medicine. Women should inform their doctor if they wish to become pregnant or think they might be pregnant. There is a potential for serious side effects to an unborn child. Talk to your health care professional or pharmacist for more information. Do not breast-feed an infant while taking this medicine. What side effects may I notice from receiving this medicine? Side effects that you should report to your doctor or  health care professional as soon as possible: -allergic reactions like skin rash, itching or hives, swelling of the face, lips, or tongue -signs of infection - fever or chills, cough, sore throat, pain or difficulty passing urine -signs of decreased platelets or bleeding - bruising, pinpoint red spots on the skin, black, tarry stools, nosebleeds -signs of decreased red blood cells - unusually weak or tired, fainting spells, lightheadedness -breathing problems -changes in hearing -changes in  vision -chest pain -high blood pressure -low blood counts - This drug may decrease the number of white blood cells, red blood cells and platelets. You may be at increased risk for infections and bleeding. -nausea and vomiting -pain, swelling, redness or irritation at the injection site -pain, tingling, numbness in the hands or feet -problems with balance, talking, walking -trouble passing urine or change in the amount of urine Side effects that usually do not require medical attention (report to your doctor or health care professional if they continue or are bothersome): -hair loss -loss of appetite -metallic taste in the mouth or changes in taste This list may not describe all possible side effects. Call your doctor for medical advice about side effects. You may report side effects to FDA at 1-800-FDA-1088. Where should I keep my medicine? This drug is given in a hospital or clinic and will not be stored at home. NOTE: This sheet is a summary. It may not cover all possible information. If you have questions about this medicine, talk to your doctor, pharmacist, or health care provider.  2012, [employer]. (04/18/2007 2:38:05 PM)

## 2012-10-30 NOTE — Telephone Encounter (Signed)
, °

## 2012-10-30 NOTE — Progress Notes (Signed)
OFFICE PROGRESS NOTE  CC  Jaime Edwards, MD [address] [place] [address] Dr. Everardo Beals Dr. 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 Patient had a palpable mass at the 3:00 position. She was seen at the breast Center on 06/20/2012 she had a mammogram and ultrasound showing a 1.7 cm mass at the 3:00 position. 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 Tumor was grade 3.  #2 MRIs performed on 06/26/2012 showed no biopsy mass at 3:00 but also 2 other adjacent masses at 1 at 2:00 measuring 0.8 x 1.6 x 2.2 cm andalso 6 mm ill-defined mass posterior to the biopsy-proven malignancy.  #3 patient has underwent Port-A-Cath placement in order to begin neoadjuvant chemotherapy initially consisting of Adriamycin Cytoxan to be given dose dense for 4 cycles.  She underwent this from 07/17/12 through 09/04/12 and it was followed by weekly Taxol and carboplatinum for a total of 12 weeks starting on 09/11/12.  CURRENT THERAPY: Taxol Carbo cycle 8  INTERVAL HISTORY: Jaime Murillo 52 y.o. female returns for evaluation prior to Taxol/Carbo cycle 8. 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  She is now having motor difficulty despite increasing her neurontin dose.  She is having difficulty with buttoning, clasping earrings with her fingers.  She also has numbness in her toes, but no balance issues.  She is anemic, but denies palpitations, chest pain, or any further concerns.  A 10 point ROS is otherwise neg.   MEDICAL HISTORY: Past Medical History  Diagnosis Date  . HSV infection   . NSVD (normal spontaneous vaginal delivery)   . Hypertension     takes HCTZ  .  Urinary frequency   . Anemia     takes Ferrous Sulfate occasionally  . Breast cancer 2014    triple negative    ALLERGIES:  has No Known Allergies.  MEDICATIONS:  Current Outpatient Prescriptions  Medication Sig Dispense Refill  . Alum & Mag Hydroxide-Simeth (MAGIC MOUTHWASH W/LIDOCAINE) SOLN Take 5 mL by mouth 4 (four) times daily. Swish and spit  500 mL  0  . dexamethasone (DECADRON) 4 MG tablet Take 1 tablet (4 mg total) by mouth 2 (two) times daily with a meal. Take 2 tabs every day on the day after chemo an then take 2 tabs twice a day with food for 2 days.  60 tablet  0  . ferrous sulfate 325 (65 FE) MG tablet Take 325 mg by mouth daily with breakfast.      . gabapentin (NEURONTIN) 100 MG capsule Take 1 capsule (100 mg total) by mouth 3 (three) times daily.  90 capsule  0  . hydrochlorothiazide (HYDRODIURIL) 25 MG tablet Take 1 tablet (25 mg total) by mouth daily.  30 tablet  11  . lidocaine-prilocaine (EMLA) cream Apply 1 application topically as needed (apply to PAC site 1-2 hours prior to treatment.).      Marland Kitchen LORazepam (ATIVAN) 0.5 MG tablet Take 0.5 mg by mouth every 6 (six) hours as needed for anxiety (as needed for nausea and vomitting).      . ondansetron (ZOFRAN) 8 MG tablet Take 8 mg by mouth every 8 (eight) hours as needed for nausea (Take 1 pill by mouth twice  a day as needed  for nausea and vomitting starting 3rd day after chemo).      Marland Kitchen oxyCODONE-acetaminophen (ROXICET) 5-325 MG per tablet Take 1-2 tablets by mouth every 4 (four) hours as needed for pain.  30 tablet  0  . prochlorperazine (COMPAZINE) 10 MG tablet Take 10 mg by mouth every 6 (six) hours as needed (for nausea and vomitting).      . prochlorperazine (COMPAZINE) 25 MG suppository Place 25 mg rectally every 12 (twelve) hours as needed for nausea (as needed for nausea and vomitting).      Marland Kitchen UNABLE TO FIND Apply 1 application topically once. 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  1 application  0  . valACYclovir (VALTREX) 500 MG tablet Take 500 mg by mouth daily as needed. For herpes       No current facility-administered medications for this visit.    SURGICAL HISTORY:  Past Surgical History  Procedure Laterality Date  . Tubal ligation  2003    LTL  . Portacath placement N/A 07/06/2012    Procedure: INSERTION PORT-A-CATH;  Surgeon: Almond Lint, MD;  Location: [facility] OR;  Service: General;  Laterality: N/A;    REVIEW OF SYSTEMS: A 10 point review of systems was conducted and is otherwise negative except for what is noted above.     PHYSICAL EXAMINATION: Blood pressure 114/78, pulse 98, temperature 98.1 F (36.7 C), temperature source Oral, resp. rate 18, height 5\' 3"  (1.6 m), weight 125 lb 4.8 oz (56.836 kg). Body mass index is 22.2 kg/(m^2). General: Patient is a well appearing female in no acute distress HEENT: PERRLA, sclerae anicteric no conjunctival pallor, MMM Neck: supple, no palpable adenopathy Lungs: clear to auscultation bilaterally, no wheezes, rhonchi, or rales Cardiovascular: regular rate rhythm, S1, S2, no murmurs, rubs or gallops Abdomen: Soft, non-tender, non-distended, normoactive bowel sounds, no HSM Extremities: warm and well perfused, no clubbing, cyanosis, or edema Skin: No rashes or lesions Neuro: Non-focal Breast examination: Left breast no masses or nipple discharge Right breast reveals 3 discrete masses at the 3:00 position 2:00 position.  It is nontender there is no nipple discharge. softer. ECOG PERFORMANCE STATUS: 0 - Asymptomatic  LABORATORY DATA: Lab Results  Component Value Date   WBC 3.5* 10/30/2012   HGB 7.4* 10/30/2012   HCT 22.3* 10/30/2012   MCV 97.4 10/30/2012   PLT 205 10/30/2012      Chemistry      Component Value Date/Time   NA 140 10/30/2012 1335   NA 137 06/06/2012 0853   K 3.5 10/30/2012 1335   K 3.8 06/06/2012 0853   CL 104 07/17/2012 1357   CL 102 06/06/2012 0853   CO2 30* 10/30/2012 1335    CO2 26 06/06/2012 0853   BUN 17.5 10/30/2012 1335   BUN 12 06/06/2012 0853   CREATININE 0.7 10/30/2012 1335   CREATININE 0.70 06/06/2012 0853   CREATININE 0.85 04/24/2009 1114      Component Value Date/Time   CALCIUM 9.6 10/30/2012 1335   CALCIUM 9.3 06/06/2012 0853   ALKPHOS 52 10/30/2012 1335   ALKPHOS 35* 06/06/2012 0853   AST 23 10/30/2012 1335   AST 14 06/06/2012 0853   ALT 23 10/30/2012 1335   ALT 9 06/06/2012 0853   BILITOT 0.29 10/30/2012 1335   BILITOT 0.8 06/06/2012 0853     ADDITIONAL INFORMATION: PROGNOSTIC INDICATORS - ACIS Results: IMMUNOHISTOCHEMICAL AND MORPHOMETRIC ANALYSIS BY THE AUTOMATED CELLULAR IMAGING SYSTEM (ACIS) Estrogen Receptor: 0%,  NEGATIVE Progesterone Receptor: 0%, NEGATIVE Proliferation Marker Ki67: 95% COMMENT: The negative hormone receptor study(ies) in this case have an internal positive control. REFERENCE RANGE ESTROGEN RECEPTOR NEGATIVE <1% POSITIVE =>1% PROGESTERONE RECEPTOR NEGATIVE <1% POSITIVE =>1% All controls stained appropriately Pecola Leisure MD Pathologist, Electronic Signature ( Signed 06/28/2012) CHROMOGENIC IN-SITU HYBRIDIZATION Results: HER-2/NEU BY CISH - NO AMPLIFICATION OF HER-2 DETECTED. RESULT RATIO OF HER2: CEP 17 SIGNALS 1.26 1 of 3 Duplicate copy FINAL for ALYCEA, SEGOVIANO [id number]) ADDITIONAL INFORMATION:(continued) AVERAGE HER2 COPY NUMBER PER CELL 2.45 REFERENCE RANGE NEGATIVE HER2/Chr17 Ratio <2.0 and Average HER2 copy number <4.0 EQUIVOCAL HER2/Chr17 Ratio <2.0 and Average HER2 copy number 4.0 and <6.0 POSITIVE HER2/Chr17 Ratio >=2.0 and/or Average HER2 copy number >=6.0 Jimmy Picket MD Pathologist, Electronic Signature ( Signed 06/26/2012) FINAL DIAGNOSIS Diagnosis Breast, right, needle core biopsy, 3:00 position - INVASIVE DUCTAL CARCINOMA SEE COMMENT. - DUCTAL CARCINOMA IN SITU Microscopic Comment Although the grade of tumor is best assessed at resection, with these biopsies, both the invasive and in situ  carcinoma are grade III. Breast prognostic studies are pending and will be reported in an addendum. The case is reviewed with Dr. Raynald Blend who concurs. (CRR:gt, 06/21/12) [place] RUND DO Pathologist, Electronic FINAL DIAGNOSIS Diagnosis Breast, right, needle core biopsy, mass - BENIGN BREAST TISSUE WITH FOCAL PSEUDOANGIOMATOUS STROMAL HYPERPLASIA (PASH). NO EVIDENCE OF MALIGNANCY. Microscopic Comment Called to the [facility] on 07/13/12. (JDP:caf 07/13/12)  RADIOGRAPHIC STUDIES:  Dg Chest 2 View  07/04/2012   *RADIOLOGY REPORT*  Clinical Data: Insertion of Port-A-Cath.  Breast cancer. Hypertension.  CHEST - 2 VIEW  Comparison: CT of the chest on 07/03/2012  Findings: Cardiomediastinal silhouette is within normal limits. The lungs are free of focal consolidations and pleural effusions. Bony structures have a normal appearance.  Contrast is identified within the colon following CT of the abdomen.  IMPRESSION: Negative exam.   Original Report Authenticated By: Norva Pavlov, M.D.   Ct Chest W Contrast  07/03/2012   *RADIOLOGY REPORT*  Clinical Data:  Breast cancer.  No therapy yet.  Lower inner quadrant of right breast.  CT CHEST, ABDOMEN AND PELVIS WITH CONTRAST  Technique: Contiguous axial images of the chest abdomen and pelvis were obtained after IV contrast administration.  Contrast: 80 ml Omnipaque-300  Comparison: Breast MR of 06/26/2012.  No prior CTs.  CT CHEST  Findings: Lung windows demonstrate minimal left lower lobe subpleural nodularity at 3 mm on image 40/series 4.  Right lung clear.  Soft tissue windows demonstrate small bilateral axillary nodes, without adenopathy.  No subpectoral adenopathy. No supraclavicular adenopathy.  Normal heart size, without pericardial effusion.  No central pulmonary embolism, on this non-dedicated study.  No mediastinal or hilar adenopathy.  No internal mammary adenopathy. Minimal residual thymic tissue in the anterior mediastinum (image  17/series 2).  IMPRESSION:  1. No acute process or evidence of metastatic disease in the chest. 2.  Probable subpleural lymph node in the left lower lobe. Recommend attention on follow-up.  CT ABDOMEN AND PELVIS  Findings:  Mild early phase of hepatic enhancement which decreases sensitivity for focal liver lesions.  None identified.  Normal spleen, stomach, pancreas, gallbladder, biliary tract, adrenal glands, kidneys. No retroperitoneal or retrocrural adenopathy.  Normal colon, appendix, and terminal ileum.  Normal small bowel without abdominal ascites.  No pelvic adenopathy.    Normal urinary bladder and uterus.  No adnexal mass.  Trace free pelvic fluid is likely physiologic.  Transitional right-sided lumbosacral anatomy.  Degenerative disc disease at  the lumbosacral junction.  IMPRESSION:  No acute process or evidence of metastatic disease in the abdomen or pelvis.   Original Report Authenticated By: Jeronimo Greaves, M.D.   US Breast Right  06/29/2012   *RADIOLOGY REPORT*  Clinical Data:  52 year old female with newly diagnosed right breast cancer.  Suspicious abnormal enhancement anterior and superior to the biopsy-proven neoplasm - for second look ultrasound evaluation.  RIGHT BREAST ULTRASOUND  Comparison:  Prior mammograms, ultrasounds and MRI.  Findings: Ultrasound is performed, showing the biopsy-proven neoplasm at the 3 o'clock position of the right breast 4 cm from the nipple. No other discrete sonographic abnormalities are identified, specifically medial, superior, and anterior to the biopsy-proven neoplasm.  IMPRESSION: Patchy abnormal enhancement medial superior to the biopsy-proven neoplasm identified on recent MRI is not visualized sonographically as a discrete abnormality.  MR guided biopsy is recommended if breast conservation surgery is considered.  BI-RADS CATEGORY 6:  Known biopsy-proven malignancy - appropriate action should be taken.  RECOMMENDATION: MR guided right breast biopsy, which will be  scheduled by our office.  I have discussed the findings and recommendations with the patient. Results were also provided in writing at the conclusion of the visit.  If applicable, a reminder letter will be sent to the patient regarding the next appointment.   Original Report Authenticated By: Harmon Pier, M.D.   US Breast Right  06/20/2012   *RADIOLOGY REPORT*  Clinical Data:  52 year old female for annual bilateral mammograms and palpable mass in the upper and inner right breast discovered on clinical examination.  DIGITAL DIAGNOSTIC BILATERAL MAMMOGRAM WITH CAD AND RIGHT BREAST ULTRASOUND:  Comparison:  03/21/2009 and prior mammograms dating back to 01/21/2005  Findings:  ACR Breast Density Category 4: The breast tissue is extremely dense.  Routine and spot compression views of the right breast demonstrate no evidence of suspicious mass or distortion. Scattered punctate calcifications within the breasts bilaterally are again identified.  Mammographic images were processed with CAD.  On physical exam, thickening is identified at the 12 o'clock position of the right breast 4 cm from the nipple. A firm palpable mass identified at the 3 o'clock position of the right breast 4 cm from the nipple.  Ultrasound is performed, showing no evidence of solid or cystic mass, distortion or abnormal areas of shadowing at the 12 o'clock position of the right breast. A 7 x 6 x 17 mm slightly irregular oval hypoechoic mass with calcifications is horizontally oriented at the 3 o'clock position of the right breast 4 cm from the nipple.  IMPRESSION: Indeterminate 7 x 6 x 17 mm hypoechoic mass at the 3 o'clock position of the right breast.  Tissue sampling is recommended.  No mammographic evidence of breast malignancy bilaterally.  No mammographic, palpable or sonographic abnormality at the 12 o'clock position of the right breast, in the area of palpable concern.  BI-RADS CATEGORY 4:  Suspicious abnormality - biopsy should be  considered.  RECOMMENDATION:  Ultrasound guided right breast biopsy, which will be performed today but dictated in a separate report.  I have discussed the findings and recommendations with the patient. Results were also provided in writing at the conclusion of the visit.  If applicable, a reminder letter will be sent to the patient regarding the next appointment.   Original Report Authenticated By: Harmon Pier, M.D.   Ct Abdomen Pelvis W Contrast  07/03/2012   *RADIOLOGY REPORT*  Clinical Data:  Breast cancer.  No therapy yet.  Lower inner quadrant of right breast.  CT CHEST, ABDOMEN AND PELVIS WITH CONTRAST  Technique: Contiguous axial images of the chest abdomen and pelvis were obtained after IV contrast administration.  Contrast: 80 ml Omnipaque-300  Comparison: Breast MR of 06/26/2012.  No prior CTs.  CT CHEST  Findings: Lung windows demonstrate minimal left lower lobe subpleural nodularity at 3 mm on image 40/series 4.  Right lung clear.  Soft tissue windows demonstrate small bilateral axillary nodes, without adenopathy.  No subpectoral adenopathy. No supraclavicular adenopathy.  Normal heart size, without pericardial effusion.  No central pulmonary embolism, on this non-dedicated study.  No mediastinal or hilar adenopathy.  No internal mammary adenopathy. Minimal residual thymic tissue in the anterior mediastinum (image 17/series 2).  IMPRESSION:  1. No acute process or evidence of metastatic disease in the chest. 2.  Probable subpleural lymph node in the left lower lobe. Recommend attention on follow-up.  CT ABDOMEN AND PELVIS  Findings:  Mild early phase of hepatic enhancement which decreases sensitivity for focal liver lesions.  None identified.  Normal spleen, stomach, pancreas, gallbladder, biliary tract, adrenal glands, kidneys. No retroperitoneal or retrocrural adenopathy.  Normal colon, appendix, and terminal ileum.  Normal small bowel without abdominal ascites.  No pelvic adenopathy.    Normal  urinary bladder and uterus.  No adnexal mass.  Trace free pelvic fluid is likely physiologic.  Transitional right-sided lumbosacral anatomy.  Degenerative disc disease at the lumbosacral junction.  IMPRESSION:  No acute process or evidence of metastatic disease in the abdomen or pelvis.   Original Report Authenticated By: Jeronimo Greaves, M.D.   Mr Breast Right W Wo Contrast  07/12/2012   *RADIOLOGY REPORT*  Clinical Data:  Known invasive mammary carcinoma in the right breast at 3 o'clock posteriorly.  Additional site of abnormal enhancement in the right upper inner quadrant closer to the nipple for possible biopsy.  ATTEMPTED MRI GUIDED VACUUM ASSISTED BIOPSY OF THE RIGHT BREAST WITHOUT AND WITH CONTRAST  Comparison: Previous exams.  Technique: Multiplanar, multisequence MR images of the right breast were obtained prior to and following the intravenous administration of 9 ml of Mulithance.  I met with the patient, and we discussed the procedure of MRI guided biopsy, including risks, benefits, and alternatives. Specifically, we discussed the risks of infection, bleeding, tissue injury, clip migration, and inadequate sampling.  Informed, written consent was given.  Using sterile technique, 2% Lidocaine, MRI guidance, and a 9 gauge vacuum assisted device, biopsy was attempted using a mediolateral approach.  However, the procedure was unsuccessful due to the extremely thin breast compression.  IMPRESSION:  Unsuccessful MRI guided vacuum-assisted biopsy of the right breast.  Patient was sent to the [facility] for further evaluation with ultrasound.   Original Report Authenticated By: Cain Saupe, M.D.   Mr Breast Bilateral W Wo Contrast  06/26/2012   *RADIOLOGY REPORT*  Clinical Data: History of new diagnosis of breast cancer from recent ultrasound guided core needle biopsy right breast 06/20/2012 demonstrating invasive ductal carcinoma with DCIS.  GFR 89, creatinine 0.7 on 06/06/2012.  BILATERAL BREAST MRI WITH  AND WITHOUT CONTRAST  Technique: Multiplanar, multisequence MR images of both breasts were obtained prior to and following the intravenous administration of 10ml of multihance.  Three dimensional images were evaluated at the independent DynaCad workstation.  Comparison:  Recent mammograms and ultrasound.  Findings: Examination demonstrates mild background parenchymal enhancement.  Right breast:  There is evidence of an ovoid gently lobulated and somewhat ill-defined heterogeneously enhancing mass over the deep third of the inner midportion of  the right breast representing patient's biopsy-proven malignancy.  This mass contains central clip artifact and measures approximately 0.9 x 1.9 x 1.0 cm. The anterior border of this mass is located 5.5 cm from the nipple. There is a 6 mm slightly ill-defined enhancing ovoid mass 6 mm posterior lateral to the biopsy-proven malignancy lying just superficial to the pectoralis muscle.  There is also clumped non mass enhancement anterior and slightly superior to the biopsy- proven malignancy in the middle third at approximately the 2 o'clock position.  This measures approximately 0.8 x 1.6 x 1.2 cm and is located 3 cm from the nipple.  This is suspicious for multifocal disease.  No suspicious axillary or internal mammary lymph nodes.  Left breast:  No suspicious mass, enhancement or adenopathy.  IMPRESSION: Known biopsy-proven malignancy over the deep third of the inner mid right breast.  6 mm ill-defined enhancing ovoid mass 6 mm posterior lateral to the biopsy-proven malignancy and lying just superficial to the pectoralis muscle.  Clumped non mass enhancement anterior and superior to patient's known malignancy at approximately the 2 o'clock position measuring 0.8 x 1.6 x 2.2 cm.  Findings likely represent multi focal disease.  RECOMMENDATION: Recommend second look ultrasound and core biopsy if feasible of the clumped non mass enhancement at approximately the 2 o'clock position  right breast anterior/superior to the known malignancy. If not seen by ultrasound, recommend proceeding to MR guided biopsy of this suspicious abnormality.  THREE-DIMENSIONAL MR IMAGE RENDERING ON INDEPENDENT WORKSTATION:  Three-dimensional MR images were rendered by post-processing of the original MR data on an independent workstation.  The three- dimensional MR images were interpreted, and findings were reported in the accompanying complete MRI report for this study.  BI-RADS CATEGORY 0:  Incomplete.  Need additional imaging evaluation and/or prior mammograms for comparison.   Original Report Authenticated By: Elberta Fortis, M.D.   Dg Chest Port 1 View  07/06/2012   *RADIOLOGY REPORT*  Clinical Data: Post left-sided port catheter insertion  PORTABLE CHEST - 1 VIEW  Comparison: 07/04/2012  Findings:  Grossly unchanged cardiac silhouette and mediastinal contours. Interval placement of a left anterior chest wall subclavian vein approach port-a-catheter with tip projecting over the superior cavoatrial junction. Grossly unchanged symmetric biapical pleural parenchymal thickening.  No pneumothorax.  No focal airspace opacities.  No pleural effusion.  No evidence of edema.  Unchanged bones.  IMPRESSION: Interval placement of a left anterior chest wall subclavian vein approach port-a-catheter with tip overlying the superior cavoatrial junction.  No pneumothorax.   Original Report Authenticated By: Tacey Ruiz, MD   Mm Digital Diagnostic Bilat  06/20/2012   *RADIOLOGY REPORT*  Clinical Data:  52 year old female for annual bilateral mammograms and palpable mass in the upper and inner right breast discovered on clinical examination.  DIGITAL DIAGNOSTIC BILATERAL MAMMOGRAM WITH CAD AND RIGHT BREAST ULTRASOUND:  Comparison:  03/21/2009 and prior mammograms dating back to 01/21/2005  Findings:  ACR Breast Density Category 4: The breast tissue is extremely dense.  Routine and spot compression views of the right breast  demonstrate no evidence of suspicious mass or distortion. Scattered punctate calcifications within the breasts bilaterally are again identified.  Mammographic images were processed with CAD.  On physical exam, thickening is identified at the 12 o'clock position of the right breast 4 cm from the nipple. A firm palpable mass identified at the 3 o'clock position of the right breast 4 cm from the nipple.  Ultrasound is performed, showing no evidence of solid or cystic mass,  distortion or abnormal areas of shadowing at the 12 o'clock position of the right breast. A 7 x 6 x 17 mm slightly irregular oval hypoechoic mass with calcifications is horizontally oriented at the 3 o'clock position of the right breast 4 cm from the nipple.  IMPRESSION: Indeterminate 7 x 6 x 17 mm hypoechoic mass at the 3 o'clock position of the right breast.  Tissue sampling is recommended.  No mammographic evidence of breast malignancy bilaterally.  No mammographic, palpable or sonographic abnormality at the 12 o'clock position of the right breast, in the area of palpable concern.  BI-RADS CATEGORY 4:  Suspicious abnormality - biopsy should be considered.  RECOMMENDATION:  Ultrasound guided right breast biopsy, which will be performed today but dictated in a separate report.  I have discussed the findings and recommendations with the patient. Results were also provided in writing at the conclusion of the visit.  If applicable, a reminder letter will be sent to the patient regarding the next appointment.   Original Report Authenticated By: Harmon Pier, M.D.   Mm Digital Diagnostic Unilat R  07/12/2012   *RADIOLOGY REPORT*  Clinical Data:  Ultrasound-guided core needle biopsy of a hypoechoic area at 2 o'clock 2 cm from the right nipple with clip placement.  Known invasive mammary carcinoma in the right breast. Unsuccessful right breast MRI guided biopsy earlier today.  DIGITAL DIAGNOSTIC RIGHT MAMMOGRAM  Comparison:  Previous exams.  Findings:   Films are performed following ultrasound guided biopsy of a small hypoechoic area at 2 o'clock 2 cm from the right nipple. The ribbon clip is positioned within the area of concern. The ribbon clip is approximately 2.6 cm anterior to the top hat clip from the original biopsy on 06/20/2012.  IMPRESSION: Appropriate clip placement following ultrasound-guided core needle biopsy of a hypoechoic area at 2 o'clock 2 cm in the right nipple.   Original Report Authenticated By: Cain Saupe, M.D.   Mm Digital Diagnostic Unilat R  06/20/2012   *RADIOLOGY REPORT*  Clinical Data:  52 year old female - evaluate clip placement following ultrasound guided right breast biopsy.  DIGITAL DIAGNOSTIC RIGHT MAMMOGRAM  Comparison:  Previous exams.  Findings:  Films are performed following ultrasound guided biopsy of the 7 x 6 x 17 mm hypoechoic mass at the 3 o'clock position of the right breast 4 cm from the nipple. The T shaped biopsy clip is in satisfactory position.  IMPRESSION: Satisfactory clip placement following ultrasound guided right breast biopsy.  Pathology will be followed.   Original Report Authenticated By: Harmon Pier, M.D.   Dg Fluoro Guide Cv Line-no Report  07/06/2012   CLINICAL DATA: port placement   FLOURO GUIDE CV LINE  Fluoroscopy was utilized by the requesting physician.  No radiographic  interpretation.    [place] Rt Breast Bx W Loc Dev 1st Lesion Img Bx Spec US Guide  07/13/2012   **ADDENDUM** CREATED: 07/13/2012 14:11:45  I spoke with the patient by telephone on 07/13/2012 to discuss pathology results.  Pathology demonstrates  pseudoangiomatous stromal hyperplasia.  The findings were called to the office of Dr. Donell Beers. Although the imaging findings could be related to pseudoangiomatous stromal hyperplasia, excision of the anterior biopsy site is suggested at the time of patient's definitive surgery.  The patient is considering unilateral or bilateral mastectomy at this time.  The patient reports no problems  at the biopsy site.  All questions were answered.  Recommendations:  Excision of the biopsy site. The patient is scheduled to begin chemotherapy in 4  days.  **END ADDENDUM** SIGNED BY: Blair Hailey. Manson Passey, M.D.  07/12/2012   *RADIOLOGY REPORT*  Clinical Data:  Unsuccessful MRI guided biopsy of the area of abnormal enhancement in the left anterior upper inner quadrant, anterior to the known invasive mammary carcinoma.  Second look ultrasound performed and a 5 x 4 x 4 mm hypoechoic area found at 2 o'clock 2 cm from the right nipple which will be biopsied.  ULTRASOUND GUIDED VACUUM ASSISTED CORE BIOPSY OF THE RIGHT BREAST  Sonography of the right upper inner quadrant was performed.  There is an ill-defined hypoechoic 5 x 4 x 4 mm area at 2 o'clock 2 cm from the right nipple which will be biopsied with ultrasound guidance.  The patient and I discussed the procedure of ultrasound-guided biopsy, including benefits and alternatives.  We discussed the high likelihood of a successful procedure. We discussed the risks of the procedure including infection, bleeding, tissue injury, clip migration, and inadequate sampling.  Written informed consent was given.  Using sterile technique, 2% lidocaine, ultrasound guidance, and a 12 gauge vacuum assisted needle, biopsy was performed of the hypoechoic area at 2 o'clock 2 cm from the right nipple using a caudocranial approach.  At the conclusion of the procedure, a within tissue marker clip was deployed into the biopsy cavity. Follow-up 2-view mammogram was performed and dictated separately.  IMPRESSION: Ultrasound-guided biopsy of a 5 x 4 x 4 mm hypoechoic area at 2 o'clock 2 cm from the right nipple.  No apparent complications.   Original Report Authenticated By: Cain Saupe, M.D.   [place] Rt Breast Bx W Loc Dev 1st Lesion Img Bx Spec US Guide  06/21/2012   *RADIOLOGY REPORT*  Clinical Data:  Indeterminate 7 x 6 x 17 mm hypoechoic mass in the inner right breast - for tissue  sampling.  ULTRASOUND GUIDED VACUUM ASSISTED CORE BIOPSY OF THE RIGHT BREAST  Comparison: Previous exams.  I met with the patient and we discussed the procedure of ultrasound- guided biopsy, including benefits and alternatives.  We discussed the high likelihood of a successful procedure. We discussed the risks of the procedure including infection, bleeding, tissue injury, clip migration, and inadequate sampling.  Informed written consent was given.  Using sterile technique, 2% lidocaine ultrasound guidance and a 12 gauge vacuum assisted needle biopsy was performed of the 7 x 6 x 17 mm hypoechoic mass at the 3 o'clock position of the right breast 4 cm from the nipple using a lateral approach.  At the conclusion of the procedure, a T shaped tissue marker clip was deployed into the biopsy cavity.  Follow-up 2-view mammogram was performed and dictated separately.  IMPRESSION: Ultrasound-guided biopsy of right breast mass.  No apparent complications.  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 Histology correlates with imaging findings.  The patient was contacted by phone on 06/21/2012 and these results given to her which she understood. Her questions were answered. She was encouraged to obtain breast cancer educational material from [facility] at her convenience. The patient had no complaints with her biopsy site.  Recommend surgery/oncology consultation.  An appointment at [facility] has been scheduled for 06/28/2012. Recommend bilateral breast MRI, which has been scheduled for 06/26/2012. The patient was informed of these appointments.   Original Report Authenticated By: Harmon Pier, M.D.    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 Patient had other areas seen on MRI biopsy of  the second mass was negative. Patient has had her Port-A-Cath placed. She is proceeding neoadjuvant  chemotherapy to possibly conserve her breast. Chemotherapy consisted of Adriamycin Cytoxan given dose dense for 4 cycles starting on 07/17/2012 with Neulasta support.  This was followed by Taxol/Carbo which started on 09/11/12.  She received 7 cycles of this and it was discontinued early due to neuropathy.  She will now receive Gemzar/carbo beginning 10/30/12.    #2 we discussed risks benefits and side effects of chemotherapy. She has had an echocardiogram as well as chemotherapy teaching class. Port-A-Cath was placed by Dr. Donell Beers. The port is functioning well.  #3 Gemzar/Carbo starting 11/06/12  #4 vaginal herpes outbreak  #5 neuropathy  PLAN:  #1  Doing well.  Jaime Murillo is anemic and will receive a PRBC transfusion.  I gave her information regarding this in her after visit summary.    #2 Due to the neuropathy, we will change her to [employer].  She will start next week.  I gave her information regarding the chemotherapy in her after visit summary.    #3 She will return tomorrow for transfusion, and in one week for labs, evaluation and chemotherapy.    All questions were answered. The patient knows to call the clinic with any problems, questions or concerns. We can certainly see the patient much sooner if necessary.  I spent 25 minutes counseling the patient face to face. The total time spent in the appointment was 30 minutes.  Cherie Ouch Lyn Hollingshead, NP Medical Oncology [facility] Phone: [phone]    11/01/2012, 12:06 PM

## 2012-10-31 ENCOUNTER — Ambulatory Visit (HOSPITAL_BASED_OUTPATIENT_CLINIC_OR_DEPARTMENT_OTHER): Payer: Medicaid Other

## 2012-10-31 VITALS — BP 133/89 | HR 91 | Temp 98.5°F | Resp 18

## 2012-10-31 DIAGNOSIS — C50319 Malignant neoplasm of lower-inner quadrant of unspecified female breast: Secondary | ICD-10-CM

## 2012-10-31 DIAGNOSIS — D649 Anemia, unspecified: Secondary | ICD-10-CM

## 2012-10-31 DIAGNOSIS — D6481 Anemia due to antineoplastic chemotherapy: Secondary | ICD-10-CM

## 2012-10-31 MED ORDER — DIPHENHYDRAMINE HCL 25 MG PO CAPS
25.0000 mg | ORAL_CAPSULE | Freq: Once | ORAL | Status: AC
  Administered 2012-10-31: 25 mg via ORAL

## 2012-10-31 MED ORDER — SODIUM CHLORIDE 0.9 % IV SOLN: 250.0000 mL | Freq: Once | INTRAVENOUS | Status: DC

## 2012-10-31 MED ORDER — DIPHENHYDRAMINE HCL 25 MG PO CAPS
ORAL_CAPSULE | ORAL | Status: AC
  Filled 2012-10-31: qty 1

## 2012-10-31 MED ORDER — ACETAMINOPHEN 325 MG PO TABS
650.0000 mg | ORAL_TABLET | Freq: Once | ORAL | Status: AC
  Administered 2012-10-31: 650 mg via ORAL

## 2012-10-31 MED ORDER — ACETAMINOPHEN 325 MG PO TABS
ORAL_TABLET | ORAL | Status: AC
  Filled 2012-10-31: qty 2

## 2012-10-31 MED ORDER — SODIUM CHLORIDE 0.9 % IJ SOLN
10.0000 mL | INTRAMUSCULAR | Status: AC | PRN
  Administered 2012-10-31: 10 mL
  Filled 2012-10-31: qty 10

## 2012-10-31 MED ORDER — HEPARIN SOD (PORK) LOCK FLUSH 100 UNIT/ML IV SOLN
500.0000 [IU] | Freq: Every day | INTRAVENOUS | Status: AC | PRN
  Administered 2012-10-31: 500 [IU]
  Filled 2012-10-31: qty 5

## 2012-10-31 MED ORDER — SODIUM CHLORIDE 0.9 % IV SOLN
250.0000 mL | Freq: Once | INTRAVENOUS | Status: AC
  Administered 2012-10-31: 250 mL via INTRAVENOUS

## 2012-10-31 NOTE — Patient Instructions (Addendum)
Blood Transfusion Information WHAT IS A BLOOD TRANSFUSION? A transfusion is the replacement of blood or some of its parts. Blood is made up of multiple cells which provide different functions.  Red blood cells carry oxygen and are used for blood loss replacement.  White blood cells fight against infection.  Platelets control bleeding.  Plasma helps clot blood.  Other blood products are available for specialized needs, such as hemophilia or other clotting disorders. BEFORE THE TRANSFUSION  Who gives blood for transfusions?   You may be able to donate blood to be used at a later date on yourself (autologous donation).  Relatives can be asked to donate blood. This is generally not any safer than if you have received blood from a stranger. The same precautions are taken to ensure safety when a relative's blood is donated.  Healthy volunteers who are fully evaluated to make sure their blood is safe. This is blood bank blood. Transfusion therapy is the safest it has ever been in the practice of medicine. Before blood is taken from a donor, a complete history is taken to make sure that person has no history of diseases nor engages in risky social behavior (examples are intravenous drug use or sexual activity with multiple partners). The donor's travel history is screened to minimize risk of transmitting infections, such as malaria. The donated blood is tested for signs of infectious diseases, such as HIV and hepatitis. The blood is then tested to be sure it is compatible with you in order to minimize the chance of a transfusion reaction. If you or a relative donates blood, this is often done in anticipation of surgery and is not appropriate for emergency situations. It takes many days to process the donated blood. RISKS AND COMPLICATIONS Although transfusion therapy is very safe and saves many lives, the main dangers of transfusion include:   Getting an infectious disease.  Developing a  transfusion reaction. This is an allergic reaction to something in the blood you were given. Every precaution is taken to prevent this. The decision to have a blood transfusion has been considered carefully by your caregiver before blood is given. Blood is not given unless the benefits outweigh the risks. AFTER THE TRANSFUSION  Right after receiving a blood transfusion, you will usually feel much better and more energetic. This is especially true if your red blood cells have gotten low (anemic). The transfusion raises the level of the red blood cells which carry oxygen, and this usually causes an energy increase.  The nurse administering the transfusion will monitor you carefully for complications. HOME CARE INSTRUCTIONS  No special instructions are needed after a transfusion. You may find your energy is better. Speak with your caregiver about any limitations on activity for underlying diseases you may have. SEEK MEDICAL CARE IF:   Your condition is not improving after your transfusion.  You develop redness or irritation at the intravenous (IV) site. SEEK IMMEDIATE MEDICAL CARE IF:  Any of the following symptoms occur over the next 12 hours:  Shaking chills.  You have a temperature by mouth above 102 F (38.9 C), not controlled by medicine.  Chest, back, or muscle pain.  People around you feel you are not acting correctly or are confused.  Shortness of breath or difficulty breathing.  Dizziness and fainting.  You get a rash or develop hives.  You have a decrease in urine output.  Your urine turns a dark color or changes to pink, red, or brown. Any of the following   symptoms occur over the next 10 days:  You have a temperature by mouth above 102 F (38.9 C), not controlled by medicine.  Shortness of breath.  Weakness after normal activity.  The white part of the eye turns yellow (jaundice).  You have a decrease in the amount of urine or are urinating less often.  Your  urine turns a dark color or changes to pink, red, or brown. Document Released: 01/09/2000 Document Revised: 04/05/2011 Document Reviewed: 08/28/2007 Exit[employer] Patient Information 2014 [employer].  

## 2012-11-01 LAB — TYPE AND SCREEN: Unit division: 0

## 2012-11-06 ENCOUNTER — Ambulatory Visit (HOSPITAL_BASED_OUTPATIENT_CLINIC_OR_DEPARTMENT_OTHER): Payer: Medicaid Other | Admitting: Adult Health

## 2012-11-06 ENCOUNTER — Other Ambulatory Visit (HOSPITAL_BASED_OUTPATIENT_CLINIC_OR_DEPARTMENT_OTHER): Payer: Medicaid Other | Admitting: Lab

## 2012-11-06 ENCOUNTER — Ambulatory Visit: Payer: Medicaid Other | Admitting: Adult Health

## 2012-11-06 ENCOUNTER — Telehealth: Payer: Self-pay | Admitting: *Deleted

## 2012-11-06 ENCOUNTER — Ambulatory Visit (HOSPITAL_BASED_OUTPATIENT_CLINIC_OR_DEPARTMENT_OTHER): Payer: Medicaid Other

## 2012-11-06 ENCOUNTER — Encounter: Payer: Self-pay | Admitting: Adult Health

## 2012-11-06 ENCOUNTER — Other Ambulatory Visit: Payer: Medicaid Other | Admitting: Lab

## 2012-11-06 ENCOUNTER — Encounter: Payer: Self-pay | Admitting: Oncology

## 2012-11-06 VITALS — BP 155/94 | HR 74 | Temp 98.4°F | Resp 20 | Ht 63.0 in | Wt 127.2 lb

## 2012-11-06 DIAGNOSIS — I10 Essential (primary) hypertension: Secondary | ICD-10-CM

## 2012-11-06 DIAGNOSIS — C50919 Malignant neoplasm of unspecified site of unspecified female breast: Secondary | ICD-10-CM

## 2012-11-06 DIAGNOSIS — Z171 Estrogen receptor negative status [ER-]: Secondary | ICD-10-CM

## 2012-11-06 DIAGNOSIS — C50311 Malignant neoplasm of lower-inner quadrant of right female breast: Secondary | ICD-10-CM

## 2012-11-06 DIAGNOSIS — B009 Herpesviral infection, unspecified: Secondary | ICD-10-CM

## 2012-11-06 DIAGNOSIS — Z5111 Encounter for antineoplastic chemotherapy: Secondary | ICD-10-CM

## 2012-11-06 DIAGNOSIS — Z8619 Personal history of other infectious and parasitic diseases: Secondary | ICD-10-CM

## 2012-11-06 DIAGNOSIS — G589 Mononeuropathy, unspecified: Secondary | ICD-10-CM

## 2012-11-06 LAB — COMPREHENSIVE METABOLIC PANEL (CC13)
ALT: 31 U/L (ref 0–55)
Albumin: 3.2 g/dL — ABNORMAL LOW (ref 3.5–5.0)
Alkaline Phosphatase: 42 U/L (ref 40–150)
Anion Gap: 7 mEq/L (ref 3–11)
BUN: 12.7 mg/dL (ref 7.0–26.0)
CO2: 24 mEq/L (ref 22–29)
Creatinine: 0.6 mg/dL (ref 0.6–1.1)
Sodium: 138 mEq/L (ref 136–145)
Total Bilirubin: 0.52 mg/dL (ref 0.20–1.20)
Total Protein: 6.4 g/dL (ref 6.4–8.3)

## 2012-11-06 LAB — CBC WITH DIFFERENTIAL/PLATELET
EOS%: 0 % (ref 0.0–7.0)
Eosinophils Absolute: 0 10*3/uL (ref 0.0–0.5)
LYMPH%: 35 % (ref 14.0–49.7)
MCH: 32 pg (ref 25.1–34.0)
MCHC: 33.4 g/dL (ref 31.5–36.0)
MCV: 95.8 fL (ref 79.5–101.0)
MONO%: 15.7 % — ABNORMAL HIGH (ref 0.0–14.0)
NEUT#: 1.4 10*3/uL — ABNORMAL LOW (ref 1.5–6.5)
Platelets: 173 10*3/uL (ref 145–400)
RBC: 3.34 10*6/uL — ABNORMAL LOW (ref 3.70–5.45)
RDW: 16.7 % — ABNORMAL HIGH (ref 11.2–14.5)
nRBC: 0 % (ref 0–0)

## 2012-11-06 MED ORDER — DEXAMETHASONE SODIUM PHOSPHATE 10 MG/ML IJ SOLN
INTRAMUSCULAR | Status: AC
  Filled 2012-11-06: qty 1

## 2012-11-06 MED ORDER — ONDANSETRON 8 MG/50ML IVPB (CHCC)
8.0000 mg | Freq: Once | INTRAVENOUS | Status: AC
  Administered 2012-11-06: 8 mg via INTRAVENOUS

## 2012-11-06 MED ORDER — HEPARIN SOD (PORK) LOCK FLUSH 100 UNIT/ML IV SOLN
500.0000 [IU] | Freq: Once | INTRAVENOUS | Status: AC | PRN
  Administered 2012-11-06: 500 [IU]
  Filled 2012-11-06: qty 5

## 2012-11-06 MED ORDER — SODIUM CHLORIDE 0.9 % IV SOLN
800.0000 mg/m2 | Freq: Once | INTRAVENOUS | Status: AC
  Administered 2012-11-06: 1254 mg via INTRAVENOUS
  Filled 2012-11-06: qty 32.98

## 2012-11-06 MED ORDER — SODIUM CHLORIDE 0.9 % IV SOLN
220.6000 mg | Freq: Once | INTRAVENOUS | Status: AC
  Administered 2012-11-06: 220 mg via INTRAVENOUS
  Filled 2012-11-06: qty 22

## 2012-11-06 MED ORDER — VALACYCLOVIR HCL 500 MG PO TABS: 500.0000 mg | ORAL_TABLET | Freq: Every day | ORAL | Status: DC | PRN

## 2012-11-06 MED ORDER — SODIUM CHLORIDE 0.9 % IJ SOLN
10.0000 mL | INTRAMUSCULAR | Status: DC | PRN
  Administered 2012-11-06: 10 mL
  Filled 2012-11-06: qty 10

## 2012-11-06 MED ORDER — LORAZEPAM 0.5 MG PO TABS: 0.5000 mg | ORAL_TABLET | Freq: Four times a day (QID) | ORAL | Status: DC | PRN

## 2012-11-06 MED ORDER — HYDROCHLOROTHIAZIDE 25 MG PO TABS: 25.0000 mg | ORAL_TABLET | Freq: Every day | ORAL | Status: DC

## 2012-11-06 MED ORDER — PROCHLORPERAZINE MALEATE 10 MG PO TABS: 10.0000 mg | ORAL_TABLET | Freq: Four times a day (QID) | ORAL | Status: DC | PRN

## 2012-11-06 MED ORDER — ONDANSETRON HCL 8 MG PO TABS: 8.0000 mg | ORAL_TABLET | Freq: Three times a day (TID) | ORAL | Status: DC | PRN

## 2012-11-06 MED ORDER — ONDANSETRON 8 MG/NS 50 ML IVPB
INTRAVENOUS | Status: AC
  Filled 2012-11-06: qty 8

## 2012-11-06 MED ORDER — SODIUM CHLORIDE 0.9 % IV SOLN
Freq: Once | INTRAVENOUS | Status: AC
  Administered 2012-11-06: 15:00:00 via INTRAVENOUS

## 2012-11-06 MED ORDER — DEXAMETHASONE 4 MG PO TABS: 8.0000 mg | ORAL_TABLET | Freq: Two times a day (BID) | ORAL | Status: DC

## 2012-11-06 MED ORDER — DEXAMETHASONE SODIUM PHOSPHATE 10 MG/ML IJ SOLN
10.0000 mg | Freq: Once | INTRAMUSCULAR | Status: AC
  Administered 2012-11-06: 10 mg via INTRAVENOUS

## 2012-11-06 MED ORDER — ONDANSETRON HCL 8 MG PO TABS: 8.0000 mg | ORAL_TABLET | Freq: Two times a day (BID) | ORAL | Status: DC

## 2012-11-06 NOTE — Patient Instructions (Addendum)
Carboplatin injection What is this medicine? CARBOPLATIN (KAR boe pla tin) is a chemotherapy drug. It targets fast dividing cells, like cancer cells, and causes these cells to die. This medicine is used to treat ovarian cancer and many other cancers. This medicine may be used for other purposes; ask your health care provider or pharmacist if you have questions. What should I tell my health care provider before I take this medicine? They need to know if you have any of these conditions: -blood disorders -hearing problems -kidney disease -recent or ongoing radiation therapy -an unusual or allergic reaction to carboplatin, cisplatin, other chemotherapy, other medicines, foods, dyes, or preservatives -pregnant or trying to get pregnant -breast-feeding How should I use this medicine? This drug is usually given as an infusion into a vein. It is administered in a hospital or clinic by a specially trained health care professional. Talk to your pediatrician regarding the use of this medicine in children. Special care may be needed. Overdosage: If you think you have taken too much of this medicine contact a poison control center or emergency room at once. NOTE: This medicine is only for you. Do not share this medicine with others. What if I miss a dose? It is important not to miss a dose. Call your doctor or health care professional if you are unable to keep an appointment. What may interact with this medicine? -medicines for seizures -medicines to increase blood counts like filgrastim, pegfilgrastim, sargramostim -some antibiotics like amikacin, gentamicin, neomycin, streptomycin, tobramycin -vaccines Talk to your doctor or health care professional before taking any of these medicines: -acetaminophen -aspirin -ibuprofen -ketoprofen -naproxen This list may not describe all possible interactions. Give your health care provider a list of all the medicines, herbs, non-prescription drugs, or dietary  supplements you use. Also tell them if you smoke, drink alcohol, or use illegal drugs. Some items may interact with your medicine. What should I watch for while using this medicine? Your condition will be monitored carefully while you are receiving this medicine. You will need important blood work done while you are taking this medicine. This drug may make you feel generally unwell. This is not uncommon, as chemotherapy can affect healthy cells as well as cancer cells. Report any side effects. Continue your course of treatment even though you feel ill unless your doctor tells you to stop. In some cases, you may be given additional medicines to help with side effects. Follow all directions for their use. Call your doctor or health care professional for advice if you get a fever, chills or sore throat, or other symptoms of a cold or flu. Do not treat yourself. This drug decreases your body's ability to fight infections. Try to avoid being around people who are sick. This medicine may increase your risk to bruise or bleed. Call your doctor or health care professional if you notice any unusual bleeding. Be careful brushing and flossing your teeth or using a toothpick because you may get an infection or bleed more easily. If you have any dental work done, tell your dentist you are receiving this medicine. Avoid taking products that contain aspirin, acetaminophen, ibuprofen, naproxen, or ketoprofen unless instructed by your doctor. These medicines may hide a fever. Do not become pregnant while taking this medicine. Women should inform their doctor if they wish to become pregnant or think they might be pregnant. There is a potential for serious side effects to an unborn child. Talk to your health care professional or pharmacist for more information.   Do not breast-feed an infant while taking this medicine. What side effects may I notice from receiving this medicine? Side effects that you should report to your  doctor or health care professional as soon as possible: -allergic reactions like skin rash, itching or hives, swelling of the face, lips, or tongue -signs of infection - fever or chills, cough, sore throat, pain or difficulty passing urine -signs of decreased platelets or bleeding - bruising, pinpoint red spots on the skin, black, tarry stools, nosebleeds -signs of decreased red blood cells - unusually weak or tired, fainting spells, lightheadedness -breathing problems -changes in hearing -changes in vision -chest pain -high blood pressure -low blood counts - This drug may decrease the number of white blood cells, red blood cells and platelets. You may be at increased risk for infections and bleeding. -nausea and vomiting -pain, swelling, redness or irritation at the injection site -pain, tingling, numbness in the hands or feet -problems with balance, talking, walking -trouble passing urine or change in the amount of urine Side effects that usually do not require medical attention (report to your doctor or health care professional if they continue or are bothersome): -hair loss -loss of appetite -metallic taste in the mouth or changes in taste This list may not describe all possible side effects. Call your doctor for medical advice about side effects. You may report side effects to FDA at 1-800-FDA-1088. Where should I keep my medicine? This drug is given in a hospital or clinic and will not be stored at home. NOTE: This sheet is a summary. It may not cover all possible information. If you have questions about this medicine, talk to your doctor, pharmacist, or health care provider.  2012, [employer]. (04/18/2007 2:38:05 PM)Gemcitabine injection What is this medicine? GEMCITABINE (jem SIT a been) is a chemotherapy drug. This medicine is used to treat many types of cancer like breast cancer, lung cancer, pancreatic cancer, and ovarian cancer. This medicine may be used for other  purposes; ask your health care provider or pharmacist if you have questions. What should I tell my health care provider before I take this medicine? They need to know if you have any of these conditions: -blood disorders -infection -kidney disease -liver disease -recent or ongoing radiation therapy -an unusual or allergic reaction to gemcitabine, other chemotherapy, other medicines, foods, dyes, or preservatives -pregnant or trying to get pregnant -breast-feeding How should I use this medicine? This drug is given as an infusion into a vein. It is administered in a hospital or clinic by a specially trained health care professional. Talk to your pediatrician regarding the use of this medicine in children. Special care may be needed. Overdosage: If you think you have taken too much of this medicine contact a poison control center or emergency room at once. NOTE: This medicine is only for you. Do not share this medicine with others. What if I miss a dose? It is important not to miss your dose. Call your doctor or health care professional if you are unable to keep an appointment. What may interact with this medicine? -medicines to increase blood counts like filgrastim, pegfilgrastim, sargramostim -some other chemotherapy drugs like cisplatin -vaccines Talk to your doctor or health care professional before taking any of these medicines: -acetaminophen -aspirin -ibuprofen -ketoprofen -naproxen This list may not describe all possible interactions. Give your health care provider a list of all the medicines, herbs, non-prescription drugs, or dietary supplements you use. Also tell them if you smoke, drink alcohol, or   use illegal drugs. Some items may interact with your medicine. What should I watch for while using this medicine? Visit your doctor for checks on your progress. This drug may make you feel generally unwell. This is not uncommon, as chemotherapy can affect healthy cells as well as  cancer cells. Report any side effects. Continue your course of treatment even though you feel ill unless your doctor tells you to stop. In some cases, you may be given additional medicines to help with side effects. Follow all directions for their use. Call your doctor or health care professional for advice if you get a fever, chills or sore throat, or other symptoms of a cold or flu. Do not treat yourself. This drug decreases your body's ability to fight infections. Try to avoid being around people who are sick. This medicine may increase your risk to bruise or bleed. Call your doctor or health care professional if you notice any unusual bleeding. Be careful brushing and flossing your teeth or using a toothpick because you may get an infection or bleed more easily. If you have any dental work done, tell your dentist you are receiving this medicine. Avoid taking products that contain aspirin, acetaminophen, ibuprofen, naproxen, or ketoprofen unless instructed by your doctor. These medicines may hide a fever. Women should inform their doctor if they wish to become pregnant or think they might be pregnant. There is a potential for serious side effects to an unborn child. Talk to your health care professional or pharmacist for more information. Do not breast-feed an infant while taking this medicine. What side effects may I notice from receiving this medicine? Side effects that you should report to your doctor or health care professional as soon as possible: -allergic reactions like skin rash, itching or hives, swelling of the face, lips, or tongue -low blood counts - this medicine may decrease the number of white blood cells, red blood cells and platelets. You may be at increased risk for infections and bleeding. -signs of infection - fever or chills, cough, sore throat, pain or difficulty passing urine -signs of decreased platelets or bleeding - bruising, pinpoint red spots on the skin, black, tarry  stools, blood in the urine -signs of decreased red blood cells - unusually weak or tired, fainting spells, lightheadedness -breathing problems -chest pain -mouth sores -nausea and vomiting -pain, swelling, redness at site where injected -pain, tingling, numbness in the hands or feet -stomach pain -swelling of ankles, feet, hands -unusual bleeding Side effects that usually do not require medical attention (report to your doctor or health care professional if they continue or are bothersome): -constipation -diarrhea -hair loss -loss of appetite -stomach upset This list may not describe all possible side effects. Call your doctor for medical advice about side effects. You may report side effects to FDA at 1-800-FDA-1088. Where should I keep my medicine? This drug is given in a hospital or clinic and will not be stored at home. NOTE: This sheet is a summary. It may not cover all possible information. If you have questions about this medicine, talk to your doctor, pharmacist, or health care provider.  2013, [employer]. (05/23/2007 6:45:54 PM)  

## 2012-11-06 NOTE — Progress Notes (Signed)
OK to treat with chemo today with WBC 2.8 and ANC 1.4, per Dr. Welton Flakes.

## 2012-11-06 NOTE — Progress Notes (Signed)
Per MD via Kenney Houseman, RN, OK to treat with ANC 1.4 today. Will proceed with chemotherapy as scheduled.

## 2012-11-06 NOTE — Patient Instructions (Signed)
Doing well.  Proceed with chemotherapy.  Please call us if you have any questions or concerns.    

## 2012-11-06 NOTE — Progress Notes (Signed)
OFFICE PROGRESS NOTE  CC  [name], MD [address] [place] [address] Dr. Everardo Beals Dr. 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 Patient had a palpable mass at the 3:00 position. She was seen at the breast Center on 06/20/2012 she had a mammogram and ultrasound showing a 1.7 cm mass at the 3:00 position. 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 Tumor was grade 3.  #2 MRIs performed on 06/26/2012 showed no biopsy mass at 3:00 but also 2 other adjacent masses at 1 at 2:00 measuring 0.8 x 1.6 x 2.2 cm andalso 6 mm ill-defined mass posterior to the biopsy-proven malignancy.  #3 patient has underwent Port-A-Cath placement in order to begin neoadjuvant chemotherapy initially consisting of Adriamycin Cytoxan to be given dose dense for 4 cycles.  She underwent this from 07/17/12 through 09/04/12 and it was followed by weekly Taxol and carboplatinum for a total of 12 weeks starting on 09/11/12.  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  She will receive Gemzar Carbo on 11/06/12  CURRENT THERAPY: Gemzar Carbo cycle 1 day 1  INTERVAL HISTORY: Jaime Murillo 52 y.o. female returns for evaluation prior to cycle one of Gemzar [place].  She's doing well today.  She denies fevers, chills, nausea, vomiting, constipation, diarrhea.  She feels her numbness is slightly worse.  Otherwise, she is without questions or concerns.    MEDICAL HISTORY: Past Medical History  Diagnosis Date  . HSV infection   . NSVD (normal spontaneous vaginal delivery)   . Hypertension     takes HCTZ  . Urinary frequency   . Anemia      takes Ferrous Sulfate occasionally  . Breast cancer 2014    triple negative    ALLERGIES:  has No Known Allergies.  MEDICATIONS:  Current Outpatient Prescriptions  Medication Sig Dispense Refill  . Alum & Mag Hydroxide-Simeth (MAGIC MOUTHWASH W/LIDOCAINE) SOLN Take 5 mL by mouth 4 (four) times daily. Swish and spit  500 mL  0  . dexamethasone (DECADRON) 4 MG tablet Take 1 tablet (4 mg total) by mouth 2 (two) times daily with a meal. Take 2 tabs every day on the day after chemo an then take 2 tabs twice a day with food for 2 days.  60 tablet  0  . ferrous sulfate 325 (65 FE) MG tablet Take 325 mg by mouth daily with breakfast.      . gabapentin (NEURONTIN) 100 MG capsule Take 1 capsule (100 mg total) by mouth 3 (three) times daily.  90 capsule  0  . hydrochlorothiazide (HYDRODIURIL) 25 MG tablet Take 1 tablet (25 mg total) by mouth daily.  30 tablet  11  . lidocaine-prilocaine (EMLA) cream Apply 1 application topically as needed (apply to PAC site 1-2 hours prior to treatment.).      Marland Kitchen LORazepam (ATIVAN) 0.5 MG tablet Take 0.5 mg by mouth every 6 (six) hours as needed for anxiety (as needed for nausea and vomitting).      . ondansetron (ZOFRAN) 8 MG tablet Take 1 tablet (8 mg total) by mouth every 8 (eight) hours as needed for nausea (Take 1 pill by mouth twice a day  as needed  for nausea and vomitting starting 3rd day after chemo).  20 tablet  3  . oxyCODONE-acetaminophen (ROXICET) 5-325 MG per tablet Take 1-2 tablets by mouth every 4 (four) hours as needed for pain.  30 tablet  0  . prochlorperazine (COMPAZINE) 10 MG tablet Take 10 mg by mouth every 6 (six) hours as needed (for nausea and vomitting).      . prochlorperazine (COMPAZINE) 25 MG suppository Place 25 mg rectally every 12 (twelve) hours as needed for nausea (as needed for nausea and vomitting).      Marland Kitchen UNABLE TO FIND Apply 1 application topically once. 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  1 application  0  . valACYclovir (VALTREX) 500 MG tablet Take 1 tablet (500 mg total) by mouth daily as needed. For herpes  30 tablet  2  . dexamethasone (DECADRON) 4 MG tablet Take 2 tablets (8 mg total) by mouth 2 (two) times daily with a meal. Take daily starting the day after chemotherapy for 2 days. Take with food.  30 tablet  1  . LORazepam (ATIVAN) 0.5 MG tablet Take 1 tablet (0.5 mg total) by mouth every 6 (six) hours as needed (Nausea or vomiting).  30 tablet  0  . ondansetron (ZOFRAN) 8 MG tablet Take 1 tablet (8 mg total) by mouth 2 (two) times daily. Take two times a day starting the day after chemo for 2 days. Then take two times a day as needed for nausea or vomiting.  30 tablet  1  . prochlorperazine (COMPAZINE) 10 MG tablet Take 1 tablet (10 mg total) by mouth every 6 (six) hours as needed (Nausea or vomiting).  30 tablet  1   No current facility-administered medications for this visit.    SURGICAL HISTORY:  Past Surgical History  Procedure Laterality Date  . Tubal ligation  2003    LTL  . Portacath placement N/A 07/06/2012    Procedure: INSERTION PORT-A-CATH;  Surgeon: Almond Lint, MD;  Location: [facility] OR;  Service: General;  Laterality: N/A;    REVIEW OF SYSTEMS: A 10 point review of systems was conducted and is otherwise negative except for what is noted above.     PHYSICAL EXAMINATION: Blood pressure 155/94, pulse 74, temperature 98.4 F (36.9 C), temperature source Oral, resp. rate 20, height 5\' 3"  (1.6 m), weight 127 lb 3.2 oz (57.698 kg). Body mass index is 22.54 kg/(m^2). General: Patient is a well appearing female in no acute distress HEENT: PERRLA, sclerae anicteric no conjunctival pallor, MMM Neck: supple, no palpable adenopathy Lungs: clear to auscultation bilaterally, no wheezes, rhonchi, or rales Cardiovascular: regular rate rhythm, S1, S2, no murmurs, rubs or gallops Abdomen: Soft, non-tender, non-distended, normoactive bowel sounds, no  HSM Extremities: warm and well perfused, no clubbing, cyanosis, or edema Skin: No rashes or lesions Neuro: Non-focal Breast examination: Left breast no masses or nipple discharge Right breast reveals 3 discrete masses at the 3:00 position 2:00 position.  It is nontender there is no nipple discharge. softer. ECOG PERFORMANCE STATUS: 0 - Asymptomatic  LABORATORY DATA: Lab Results  Component Value Date   WBC 2.8* 11/06/2012   HGB 10.7* 11/06/2012   HCT 32.0* 11/06/2012   MCV 95.8 11/06/2012   PLT 173 11/06/2012      Chemistry      Component Value Date/Time   NA 138 11/06/2012 1330   NA 137 06/06/2012 0853   K 3.7 11/06/2012 1330   K  3.8 06/06/2012 0853   CL 104 07/17/2012 1357   CL 102 06/06/2012 0853   CO2 24 11/06/2012 1330   CO2 26 06/06/2012 0853   BUN 12.7 11/06/2012 1330   BUN 12 06/06/2012 0853   CREATININE 0.6 11/06/2012 1330   CREATININE 0.70 06/06/2012 0853   CREATININE 0.85 04/24/2009 1114      Component Value Date/Time   CALCIUM 8.9 11/06/2012 1330   CALCIUM 9.3 06/06/2012 0853   ALKPHOS 42 11/06/2012 1330   ALKPHOS 35* 06/06/2012 0853   AST 26 11/06/2012 1330   AST 14 06/06/2012 0853   ALT 31 11/06/2012 1330   ALT 9 06/06/2012 0853   BILITOT 0.52 11/06/2012 1330   BILITOT 0.8 06/06/2012 0853     ADDITIONAL INFORMATION: PROGNOSTIC INDICATORS - ACIS Results: IMMUNOHISTOCHEMICAL AND MORPHOMETRIC ANALYSIS BY THE AUTOMATED CELLULAR IMAGING SYSTEM (ACIS) Estrogen Receptor: 0%, NEGATIVE Progesterone Receptor: 0%, NEGATIVE Proliferation Marker Ki67: 95% COMMENT: The negative hormone receptor study(ies) in this case have an internal positive control. REFERENCE RANGE ESTROGEN RECEPTOR NEGATIVE <1% POSITIVE =>1% PROGESTERONE RECEPTOR NEGATIVE <1% POSITIVE =>1% All controls stained appropriately Pecola Leisure MD Pathologist, Electronic Signature ( Signed 06/28/2012) CHROMOGENIC IN-SITU HYBRIDIZATION Results: HER-2/NEU BY CISH - NO AMPLIFICATION OF HER-2  DETECTED. RESULT RATIO OF HER2: CEP 17 SIGNALS 1.26 1 of 3 Duplicate copy FINAL for BRYLIE, SNEATH [phone]) ADDITIONAL INFORMATION:(continued) AVERAGE HER2 COPY NUMBER PER CELL 2.45 REFERENCE RANGE NEGATIVE HER2/Chr17 Ratio <2.0 and Average HER2 copy number <4.0 EQUIVOCAL HER2/Chr17 Ratio <2.0 and Average HER2 copy number 4.0 and <6.0 POSITIVE HER2/Chr17 Ratio >=2.0 and/or Average HER2 copy number >=6.0 Jimmy Picket MD Pathologist, Electronic Signature ( Signed 06/26/2012) FINAL DIAGNOSIS Diagnosis Breast, right, needle core biopsy, 3:00 position - INVASIVE DUCTAL CARCINOMA SEE COMMENT. - DUCTAL CARCINOMA IN SITU Microscopic Comment Although the grade of tumor is best assessed at resection, with these biopsies, both the invasive and in situ carcinoma are grade III. Breast prognostic studies are pending and will be reported in an addendum. The case is reviewed with Dr. Raynald Blend who concurs. (CRR:gt, 06/21/12) [place] RUND DO Pathologist, Electronic FINAL DIAGNOSIS Diagnosis Breast, right, needle core biopsy, mass - BENIGN BREAST TISSUE WITH FOCAL PSEUDOANGIOMATOUS STROMAL HYPERPLASIA (PASH). NO EVIDENCE OF MALIGNANCY. Microscopic Comment Called to the [facility] on 07/13/12. (JDP:caf 07/13/12)  RADIOGRAPHIC STUDIES:  Dg Chest 2 View  07/04/2012   *RADIOLOGY REPORT*  Clinical Data: Insertion of Port-A-Cath.  Breast cancer. Hypertension.  CHEST - 2 VIEW  Comparison: CT of the chest on 07/03/2012  Findings: Cardiomediastinal silhouette is within normal limits. The lungs are free of focal consolidations and pleural effusions. Bony structures have a normal appearance.  Contrast is identified within the colon following CT of the abdomen.  IMPRESSION: Negative exam.   Original Report Authenticated By: Norva Pavlov, M.D.   Ct Chest W Contrast  07/03/2012   *RADIOLOGY REPORT*  Clinical Data:  Breast cancer.  No therapy yet.  Lower inner quadrant of right breast.  CT  CHEST, ABDOMEN AND PELVIS WITH CONTRAST  Technique: Contiguous axial images of the chest abdomen and pelvis were obtained after IV contrast administration.  Contrast: 80 ml Omnipaque-300  Comparison: Breast MR of 06/26/2012.  No prior CTs.  CT CHEST  Findings: Lung windows demonstrate minimal left lower lobe subpleural nodularity at 3 mm on image 40/series 4.  Right lung clear.  Soft tissue windows demonstrate small bilateral axillary nodes, without adenopathy.  No subpectoral adenopathy. No supraclavicular adenopathy.  Normal heart size, without pericardial  effusion.  No central pulmonary embolism, on this non-dedicated study.  No mediastinal or hilar adenopathy.  No internal mammary adenopathy. Minimal residual thymic tissue in the anterior mediastinum (image 17/series 2).  IMPRESSION:  1. No acute process or evidence of metastatic disease in the chest. 2.  Probable subpleural lymph node in the left lower lobe. Recommend attention on follow-up.  CT ABDOMEN AND PELVIS  Findings:  Mild early phase of hepatic enhancement which decreases sensitivity for focal liver lesions.  None identified.  Normal spleen, stomach, pancreas, gallbladder, biliary tract, adrenal glands, kidneys. No retroperitoneal or retrocrural adenopathy.  Normal colon, appendix, and terminal ileum.  Normal small bowel without abdominal ascites.  No pelvic adenopathy.    Normal urinary bladder and uterus.  No adnexal mass.  Trace free pelvic fluid is likely physiologic.  Transitional right-sided lumbosacral anatomy.  Degenerative disc disease at the lumbosacral junction.  IMPRESSION:  No acute process or evidence of metastatic disease in the abdomen or pelvis.   Original Report Authenticated By: Jeronimo Greaves, M.D.   US Breast Right  06/29/2012   *RADIOLOGY REPORT*  Clinical Data:  52 year old female with newly diagnosed right breast cancer.  Suspicious abnormal enhancement anterior and superior to the biopsy-proven neoplasm - for second look  ultrasound evaluation.  RIGHT BREAST ULTRASOUND  Comparison:  Prior mammograms, ultrasounds and MRI.  Findings: Ultrasound is performed, showing the biopsy-proven neoplasm at the 3 o'clock position of the right breast 4 cm from the nipple. No other discrete sonographic abnormalities are identified, specifically medial, superior, and anterior to the biopsy-proven neoplasm.  IMPRESSION: Patchy abnormal enhancement medial superior to the biopsy-proven neoplasm identified on recent MRI is not visualized sonographically as a discrete abnormality.  MR guided biopsy is recommended if breast conservation surgery is considered.  BI-RADS CATEGORY 6:  Known biopsy-proven malignancy - appropriate action should be taken.  RECOMMENDATION: MR guided right breast biopsy, which will be scheduled by our office.  I have discussed the findings and recommendations with the patient. Results were also provided in writing at the conclusion of the visit.  If applicable, a reminder letter will be sent to the patient regarding the next appointment.   Original Report Authenticated By: Harmon Pier, M.D.   US Breast Right  06/20/2012   *RADIOLOGY REPORT*  Clinical Data:  53 year old female for annual bilateral mammograms and palpable mass in the upper and inner right breast discovered on clinical examination.  DIGITAL DIAGNOSTIC BILATERAL MAMMOGRAM WITH CAD AND RIGHT BREAST ULTRASOUND:  Comparison:  03/21/2009 and prior mammograms dating back to 01/21/2005  Findings:  ACR Breast Density Category 4: The breast tissue is extremely dense.  Routine and spot compression views of the right breast demonstrate no evidence of suspicious mass or distortion. Scattered punctate calcifications within the breasts bilaterally are again identified.  Mammographic images were processed with CAD.  On physical exam, thickening is identified at the 12 o'clock position of the right breast 4 cm from the nipple. A firm palpable mass identified at the 3 o'clock  position of the right breast 4 cm from the nipple.  Ultrasound is performed, showing no evidence of solid or cystic mass, distortion or abnormal areas of shadowing at the 12 o'clock position of the right breast. A 7 x 6 x 17 mm slightly irregular oval hypoechoic mass with calcifications is horizontally oriented at the 3 o'clock position of the right breast 4 cm from the nipple.  IMPRESSION: Indeterminate 7 x 6 x 17 mm hypoechoic mass at the 3  o'clock position of the right breast.  Tissue sampling is recommended.  No mammographic evidence of breast malignancy bilaterally.  No mammographic, palpable or sonographic abnormality at the 12 o'clock position of the right breast, in the area of palpable concern.  BI-RADS CATEGORY 4:  Suspicious abnormality - biopsy should be considered.  RECOMMENDATION:  Ultrasound guided right breast biopsy, which will be performed today but dictated in a separate report.  I have discussed the findings and recommendations with the patient. Results were also provided in writing at the conclusion of the visit.  If applicable, a reminder letter will be sent to the patient regarding the next appointment.   Original Report Authenticated By: Harmon Pier, M.D.   Ct Abdomen Pelvis W Contrast  07/03/2012   *RADIOLOGY REPORT*  Clinical Data:  Breast cancer.  No therapy yet.  Lower inner quadrant of right breast.  CT CHEST, ABDOMEN AND PELVIS WITH CONTRAST  Technique: Contiguous axial images of the chest abdomen and pelvis were obtained after IV contrast administration.  Contrast: 80 ml Omnipaque-300  Comparison: Breast MR of 06/26/2012.  No prior CTs.  CT CHEST  Findings: Lung windows demonstrate minimal left lower lobe subpleural nodularity at 3 mm on image 40/series 4.  Right lung clear.  Soft tissue windows demonstrate small bilateral axillary nodes, without adenopathy.  No subpectoral adenopathy. No supraclavicular adenopathy.  Normal heart size, without pericardial effusion.  No central  pulmonary embolism, on this non-dedicated study.  No mediastinal or hilar adenopathy.  No internal mammary adenopathy. Minimal residual thymic tissue in the anterior mediastinum (image 17/series 2).  IMPRESSION:  1. No acute process or evidence of metastatic disease in the chest. 2.  Probable subpleural lymph node in the left lower lobe. Recommend attention on follow-up.  CT ABDOMEN AND PELVIS  Findings:  Mild early phase of hepatic enhancement which decreases sensitivity for focal liver lesions.  None identified.  Normal spleen, stomach, pancreas, gallbladder, biliary tract, adrenal glands, kidneys. No retroperitoneal or retrocrural adenopathy.  Normal colon, appendix, and terminal ileum.  Normal small bowel without abdominal ascites.  No pelvic adenopathy.    Normal urinary bladder and uterus.  No adnexal mass.  Trace free pelvic fluid is likely physiologic.  Transitional right-sided lumbosacral anatomy.  Degenerative disc disease at the lumbosacral junction.  IMPRESSION:  No acute process or evidence of metastatic disease in the abdomen or pelvis.   Original Report Authenticated By: Jeronimo Greaves, M.D.   Mr Breast Right W Wo Contrast  07/12/2012   *RADIOLOGY REPORT*  Clinical Data:  Known invasive mammary carcinoma in the right breast at 3 o'clock posteriorly.  Additional site of abnormal enhancement in the right upper inner quadrant closer to the nipple for possible biopsy.  ATTEMPTED MRI GUIDED VACUUM ASSISTED BIOPSY OF THE RIGHT BREAST WITHOUT AND WITH CONTRAST  Comparison: Previous exams.  Technique: Multiplanar, multisequence MR images of the right breast were obtained prior to and following the intravenous administration of 9 ml of Mulithance.  I met with the patient, and we discussed the procedure of MRI guided biopsy, including risks, benefits, and alternatives. Specifically, we discussed the risks of infection, bleeding, tissue injury, clip migration, and inadequate sampling.  Informed, written consent  was given.  Using sterile technique, 2% Lidocaine, MRI guidance, and a 9 gauge vacuum assisted device, biopsy was attempted using a mediolateral approach.  However, the procedure was unsuccessful due to the extremely thin breast compression.  IMPRESSION:  Unsuccessful MRI guided vacuum-assisted biopsy of the right breast.  Patient was sent to the [facility] for further evaluation with ultrasound.   Original Report Authenticated By: Cain Saupe, M.D.   Mr Breast Bilateral W Wo Contrast  06/26/2012   *RADIOLOGY REPORT*  Clinical Data: History of new diagnosis of breast cancer from recent ultrasound guided core needle biopsy right breast 06/20/2012 demonstrating invasive ductal carcinoma with DCIS.  GFR 89, creatinine 0.7 on 06/06/2012.  BILATERAL BREAST MRI WITH AND WITHOUT CONTRAST  Technique: Multiplanar, multisequence MR images of both breasts were obtained prior to and following the intravenous administration of 10ml of multihance.  Three dimensional images were evaluated at the independent DynaCad workstation.  Comparison:  Recent mammograms and ultrasound.  Findings: Examination demonstrates mild background parenchymal enhancement.  Right breast:  There is evidence of an ovoid gently lobulated and somewhat ill-defined heterogeneously enhancing mass over the deep third of the inner midportion of the right breast representing patient's biopsy-proven malignancy.  This mass contains central clip artifact and measures approximately 0.9 x 1.9 x 1.0 cm. The anterior border of this mass is located 5.5 cm from the nipple. There is a 6 mm slightly ill-defined enhancing ovoid mass 6 mm posterior lateral to the biopsy-proven malignancy lying just superficial to the pectoralis muscle.  There is also clumped non mass enhancement anterior and slightly superior to the biopsy- proven malignancy in the middle third at approximately the 2 o'clock position.  This measures approximately 0.8 x 1.6 x 1.2 cm and is located 3  cm from the nipple.  This is suspicious for multifocal disease.  No suspicious axillary or internal mammary lymph nodes.  Left breast:  No suspicious mass, enhancement or adenopathy.  IMPRESSION: Known biopsy-proven malignancy over the deep third of the inner mid right breast.  6 mm ill-defined enhancing ovoid mass 6 mm posterior lateral to the biopsy-proven malignancy and lying just superficial to the pectoralis muscle.  Clumped non mass enhancement anterior and superior to patient's known malignancy at approximately the 2 o'clock position measuring 0.8 x 1.6 x 2.2 cm.  Findings likely represent multi focal disease.  RECOMMENDATION: Recommend second look ultrasound and core biopsy if feasible of the clumped non mass enhancement at approximately the 2 o'clock position right breast anterior/superior to the known malignancy. If not seen by ultrasound, recommend proceeding to MR guided biopsy of this suspicious abnormality.  THREE-DIMENSIONAL MR IMAGE RENDERING ON INDEPENDENT WORKSTATION:  Three-dimensional MR images were rendered by post-processing of the original MR data on an independent workstation.  The three- dimensional MR images were interpreted, and findings were reported in the accompanying complete MRI report for this study.  BI-RADS CATEGORY 0:  Incomplete.  Need additional imaging evaluation and/or prior mammograms for comparison.   Original Report Authenticated By: Elberta Fortis, M.D.   Dg Chest Port 1 View  07/06/2012   *RADIOLOGY REPORT*  Clinical Data: Post left-sided port catheter insertion  PORTABLE CHEST - 1 VIEW  Comparison: 07/04/2012  Findings:  Grossly unchanged cardiac silhouette and mediastinal contours. Interval placement of a left anterior chest wall subclavian vein approach port-a-catheter with tip projecting over the superior cavoatrial junction. Grossly unchanged symmetric biapical pleural parenchymal thickening.  No pneumothorax.  No focal airspace opacities.  No pleural effusion.  No  evidence of edema.  Unchanged bones.  IMPRESSION: Interval placement of a left anterior chest wall subclavian vein approach port-a-catheter with tip overlying the superior cavoatrial junction.  No pneumothorax.   Original Report Authenticated By: Tacey Ruiz, MD   Mm Digital Diagnostic Bilat  06/20/2012   *  RADIOLOGY REPORT*  Clinical Data:  52 year old female for annual bilateral mammograms and palpable mass in the upper and inner right breast discovered on clinical examination.  DIGITAL DIAGNOSTIC BILATERAL MAMMOGRAM WITH CAD AND RIGHT BREAST ULTRASOUND:  Comparison:  03/21/2009 and prior mammograms dating back to 01/21/2005  Findings:  ACR Breast Density Category 4: The breast tissue is extremely dense.  Routine and spot compression views of the right breast demonstrate no evidence of suspicious mass or distortion. Scattered punctate calcifications within the breasts bilaterally are again identified.  Mammographic images were processed with CAD.  On physical exam, thickening is identified at the 12 o'clock position of the right breast 4 cm from the nipple. A firm palpable mass identified at the 3 o'clock position of the right breast 4 cm from the nipple.  Ultrasound is performed, showing no evidence of solid or cystic mass, distortion or abnormal areas of shadowing at the 12 o'clock position of the right breast. A 7 x 6 x 17 mm slightly irregular oval hypoechoic mass with calcifications is horizontally oriented at the 3 o'clock position of the right breast 4 cm from the nipple.  IMPRESSION: Indeterminate 7 x 6 x 17 mm hypoechoic mass at the 3 o'clock position of the right breast.  Tissue sampling is recommended.  No mammographic evidence of breast malignancy bilaterally.  No mammographic, palpable or sonographic abnormality at the 12 o'clock position of the right breast, in the area of palpable concern.  BI-RADS CATEGORY 4:  Suspicious abnormality - biopsy should be considered.  RECOMMENDATION:  Ultrasound  guided right breast biopsy, which will be performed today but dictated in a separate report.  I have discussed the findings and recommendations with the patient. Results were also provided in writing at the conclusion of the visit.  If applicable, a reminder letter will be sent to the patient regarding the next appointment.   Original Report Authenticated By: Harmon Pier, M.D.   Mm Digital Diagnostic Unilat R  07/12/2012   *RADIOLOGY REPORT*  Clinical Data:  Ultrasound-guided core needle biopsy of a hypoechoic area at 2 o'clock 2 cm from the right nipple with clip placement.  Known invasive mammary carcinoma in the right breast. Unsuccessful right breast MRI guided biopsy earlier today.  DIGITAL DIAGNOSTIC RIGHT MAMMOGRAM  Comparison:  Previous exams.  Findings:  Films are performed following ultrasound guided biopsy of a small hypoechoic area at 2 o'clock 2 cm from the right nipple. The ribbon clip is positioned within the area of concern. The ribbon clip is approximately 2.6 cm anterior to the top hat clip from the original biopsy on 06/20/2012.  IMPRESSION: Appropriate clip placement following ultrasound-guided core needle biopsy of a hypoechoic area at 2 o'clock 2 cm in the right nipple.   Original Report Authenticated By: Cain Saupe, M.D.   Mm Digital Diagnostic Unilat R  06/20/2012   *RADIOLOGY REPORT*  Clinical Data:  52 year old female - evaluate clip placement following ultrasound guided right breast biopsy.  DIGITAL DIAGNOSTIC RIGHT MAMMOGRAM  Comparison:  Previous exams.  Findings:  Films are performed following ultrasound guided biopsy of the 7 x 6 x 17 mm hypoechoic mass at the 3 o'clock position of the right breast 4 cm from the nipple. The T shaped biopsy clip is in satisfactory position.  IMPRESSION: Satisfactory clip placement following ultrasound guided right breast biopsy.  Pathology will be followed.   Original Report Authenticated By: Harmon Pier, M.D.   Dg Fluoro Guide Cv Line-no  Report  07/06/2012   CLINICAL  DATA: port placement   FLOURO GUIDE CV LINE  Fluoroscopy was utilized by the requesting physician.  No radiographic  interpretation.    [place] Rt Breast Bx W Loc Dev 1st Lesion Img Bx Spec US Guide  07/13/2012   **ADDENDUM** CREATED: 07/13/2012 14:11:45  I spoke with the patient by telephone on 07/13/2012 to discuss pathology results.  Pathology demonstrates  pseudoangiomatous stromal hyperplasia.  The findings were called to the office of Dr. Donell Beers. Although the imaging findings could be related to pseudoangiomatous stromal hyperplasia, excision of the anterior biopsy site is suggested at the time of patient's definitive surgery.  The patient is considering unilateral or bilateral mastectomy at this time.  The patient reports no problems at the biopsy site.  All questions were answered.  Recommendations:  Excision of the biopsy site. The patient is scheduled to begin chemotherapy in 4 days.  **END ADDENDUM** SIGNED BY: Blair Hailey. Manson Passey, M.D.  07/12/2012   *RADIOLOGY REPORT*  Clinical Data:  Unsuccessful MRI guided biopsy of the area of abnormal enhancement in the left anterior upper inner quadrant, anterior to the known invasive mammary carcinoma.  Second look ultrasound performed and a 5 x 4 x 4 mm hypoechoic area found at 2 o'clock 2 cm from the right nipple which will be biopsied.  ULTRASOUND GUIDED VACUUM ASSISTED CORE BIOPSY OF THE RIGHT BREAST  Sonography of the right upper inner quadrant was performed.  There is an ill-defined hypoechoic 5 x 4 x 4 mm area at 2 o'clock 2 cm from the right nipple which will be biopsied with ultrasound guidance.  The patient and I discussed the procedure of ultrasound-guided biopsy, including benefits and alternatives.  We discussed the high likelihood of a successful procedure. We discussed the risks of the procedure including infection, bleeding, tissue injury, clip migration, and inadequate sampling.  Written informed consent was given.   Using sterile technique, 2% lidocaine, ultrasound guidance, and a 12 gauge vacuum assisted needle, biopsy was performed of the hypoechoic area at 2 o'clock 2 cm from the right nipple using a caudocranial approach.  At the conclusion of the procedure, a within tissue marker clip was deployed into the biopsy cavity. Follow-up 2-view mammogram was performed and dictated separately.  IMPRESSION: Ultrasound-guided biopsy of a 5 x 4 x 4 mm hypoechoic area at 2 o'clock 2 cm from the right nipple.  No apparent complications.   Original Report Authenticated By: Cain Saupe, M.D.   [place] Rt Breast Bx W Loc Dev 1st Lesion Img Bx Spec US Guide  06/21/2012   *RADIOLOGY REPORT*  Clinical Data:  Indeterminate 7 x 6 x 17 mm hypoechoic mass in the inner right breast - for tissue sampling.  ULTRASOUND GUIDED VACUUM ASSISTED CORE BIOPSY OF THE RIGHT BREAST  Comparison: Previous exams.  I met with the patient and we discussed the procedure of ultrasound- guided biopsy, including benefits and alternatives.  We discussed the high likelihood of a successful procedure. We discussed the risks of the procedure including infection, bleeding, tissue injury, clip migration, and inadequate sampling.  Informed written consent was given.  Using sterile technique, 2% lidocaine ultrasound guidance and a 12 gauge vacuum assisted needle biopsy was performed of the 7 x 6 x 17 mm hypoechoic mass at the 3 o'clock position of the right breast 4 cm from the nipple using a lateral approach.  At the conclusion of the procedure, a T shaped tissue marker clip was deployed into the biopsy cavity.  Follow-up 2-view mammogram was performed  and dictated separately.  IMPRESSION: Ultrasound-guided biopsy of right breast mass.  No apparent complications.  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 Histology correlates with imaging findings.  The patient was contacted by phone on 06/21/2012 and these results given to her which she understood.  Her questions were answered. She was encouraged to obtain breast cancer educational material from [facility] at her convenience. The patient had no complaints with her biopsy site.  Recommend surgery/oncology consultation.  An appointment at the [facility] has been scheduled for 06/28/2012. Recommend bilateral breast MRI, which has been scheduled for 06/26/2012. The patient was informed of these appointments.   Original Report Authenticated By: Harmon Pier, M.D.    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 Patient had other areas seen on MRI biopsy of the second mass was negative. Patient has had her Port-A-Cath placed. She is proceeding neoadjuvant chemotherapy to possibly conserve her breast. Chemotherapy consisted of Adriamycin Cytoxan given dose dense for 4 cycles starting on 07/17/2012 with Neulasta support.  This was followed by Taxol/Carbo which started on 09/11/12.  She received 7 cycles of this and it was discontinued early due to neuropathy.  She will now receive Gemzar/carbo beginning 11/06/12.    #2 we discussed risks benefits and side effects of chemotherapy. She has had an echocardiogram as well as chemotherapy teaching class. Port-A-Cath was placed by Dr. Donell Beers. The port is functioning well.  #3 Gemzar/Carbo starting 11/06/12  She will receive 3 cycles.    #4 vaginal herpes outbreak  #5 neuropathy  PLAN:  #1  Doing well.  Patient will proceed with Gemzar/Carbo today.  We will order an MRI breasts next week.   #2 She will take Gabapentin 300mg  in the morning and afternoon and 400mg  in the evening.  We discussed the change in detail.    #3 She will return tomorrow for neulasta and in one week for labs and evaluation for chemotoxicities.    All questions were answered. The patient knows to call the clinic with any problems, questions or concerns. We can certainly see the  patient much sooner if necessary.  I spent 25 minutes counseling the patient face to face. The total time spent in the appointment was 30 minutes.  Cherie Ouch Lyn Hollingshead, NP Medical Oncology [facility] Phone: [phone] 11/07/2012, 4:20 PM

## 2012-11-06 NOTE — Telephone Encounter (Signed)
appts made and printed...td 

## 2012-11-07 ENCOUNTER — Ambulatory Visit (HOSPITAL_BASED_OUTPATIENT_CLINIC_OR_DEPARTMENT_OTHER): Payer: Medicaid Other

## 2012-11-07 VITALS — BP 142/87 | HR 98 | Temp 98.0°F

## 2012-11-07 DIAGNOSIS — Z5189 Encounter for other specified aftercare: Secondary | ICD-10-CM

## 2012-11-07 DIAGNOSIS — C50311 Malignant neoplasm of lower-inner quadrant of right female breast: Secondary | ICD-10-CM

## 2012-11-07 DIAGNOSIS — C50319 Malignant neoplasm of lower-inner quadrant of unspecified female breast: Secondary | ICD-10-CM

## 2012-11-07 MED ORDER — PEGFILGRASTIM INJECTION 6 MG/0.6ML
6.0000 mg | Freq: Once | SUBCUTANEOUS | Status: AC
  Administered 2012-11-07: 6 mg via SUBCUTANEOUS
  Filled 2012-11-07: qty 0.6

## 2012-11-08 ENCOUNTER — Other Ambulatory Visit: Payer: Self-pay | Admitting: *Deleted

## 2012-11-08 DIAGNOSIS — G629 Polyneuropathy, unspecified: Secondary | ICD-10-CM

## 2012-11-08 MED ORDER — GABAPENTIN 100 MG PO CAPS: 100.0000 mg | ORAL_CAPSULE | Freq: Three times a day (TID) | ORAL | Status: DC

## 2012-11-08 NOTE — Telephone Encounter (Signed)
Pt called requesting refill for gabapentin. Reviewed with MD, rx sent to pt's pharmacy.  Pt notified.

## 2012-11-13 ENCOUNTER — Encounter: Payer: Self-pay | Admitting: Adult Health

## 2012-11-13 ENCOUNTER — Telehealth: Payer: Self-pay | Admitting: *Deleted

## 2012-11-13 ENCOUNTER — Ambulatory Visit: Payer: Medicaid Other

## 2012-11-13 ENCOUNTER — Other Ambulatory Visit (HOSPITAL_BASED_OUTPATIENT_CLINIC_OR_DEPARTMENT_OTHER): Payer: Medicaid Other | Admitting: Lab

## 2012-11-13 ENCOUNTER — Telehealth: Payer: Self-pay | Admitting: Internal Medicine

## 2012-11-13 ENCOUNTER — Telehealth: Payer: Self-pay | Admitting: Oncology

## 2012-11-13 ENCOUNTER — Ambulatory Visit (HOSPITAL_BASED_OUTPATIENT_CLINIC_OR_DEPARTMENT_OTHER): Payer: Medicaid Other | Admitting: Adult Health

## 2012-11-13 VITALS — BP 123/77 | HR 89 | Temp 98.2°F | Resp 18 | Ht 63.0 in | Wt 126.6 lb

## 2012-11-13 DIAGNOSIS — C50919 Malignant neoplasm of unspecified site of unspecified female breast: Secondary | ICD-10-CM

## 2012-11-13 DIAGNOSIS — C50311 Malignant neoplasm of lower-inner quadrant of right female breast: Secondary | ICD-10-CM

## 2012-11-13 DIAGNOSIS — Z171 Estrogen receptor negative status [ER-]: Secondary | ICD-10-CM

## 2012-11-13 DIAGNOSIS — G609 Hereditary and idiopathic neuropathy, unspecified: Secondary | ICD-10-CM

## 2012-11-13 DIAGNOSIS — B009 Herpesviral infection, unspecified: Secondary | ICD-10-CM

## 2012-11-13 LAB — CBC WITH DIFFERENTIAL/PLATELET
Basophils Absolute: 0 10*3/uL (ref 0.0–0.1)
EOS%: 0.1 % (ref 0.0–7.0)
HCT: 32.9 % — ABNORMAL LOW (ref 34.8–46.6)
HGB: 11.1 g/dL — ABNORMAL LOW (ref 11.6–15.9)
LYMPH%: 8.1 % — ABNORMAL LOW (ref 14.0–49.7)
MCH: 32.3 pg (ref 25.1–34.0)
MCHC: 33.7 g/dL (ref 31.5–36.0)
MCV: 95.6 fL (ref 79.5–101.0)
MONO#: 0.7 10*3/uL (ref 0.1–0.9)
MONO%: 3.7 % (ref 0.0–14.0)
NEUT%: 87.9 % — ABNORMAL HIGH (ref 38.4–76.8)
Platelets: 135 10*3/uL — ABNORMAL LOW (ref 145–400)
RBC: 3.44 10*6/uL — ABNORMAL LOW (ref 3.70–5.45)
WBC: 17.7 10*3/uL — ABNORMAL HIGH (ref 3.9–10.3)

## 2012-11-13 LAB — COMPREHENSIVE METABOLIC PANEL (CC13)
ALT: 23 U/L (ref 0–55)
AST: 19 U/L (ref 5–34)
Albumin: 3.6 g/dL (ref 3.5–5.0)
Alkaline Phosphatase: 160 U/L — ABNORMAL HIGH (ref 40–150)
Anion Gap: 10 mEq/L (ref 3–11)
BUN: 15.8 mg/dL (ref 7.0–26.0)
Creatinine: 0.7 mg/dL (ref 0.6–1.1)
Glucose: 87 mg/dl (ref 70–140)
Potassium: 3.4 mEq/L — ABNORMAL LOW (ref 3.5–5.1)

## 2012-11-13 NOTE — Telephone Encounter (Signed)
Received outpatient phone call overnight. Patient called reporting worsening right sided back/hip pain. York Spaniel that she has had it mildly over the last week but it became much worse about an hour. Took 400mg  ibuprofen with little relief.  Denies any fevers, bowel/bladder incontinence, saddle anesthesia, weakness/numbness or recent trauma.  Last received Neulasta 6 days ago.  Currently on chemo for triple negative breast cancer with gem/carbo.  She has some Percocet at home and advised that she can take this to see if it helps.  She will call the clinic in the morning if her pain does not resolve and reviewed that she should come to the ED if her pain is not controlled with Percocet or if she develops other associated symptoms.

## 2012-11-13 NOTE — Telephone Encounter (Signed)
Per staff message and POF I have scheduled appts.  JMW  

## 2012-11-13 NOTE — Progress Notes (Signed)
OFFICE PROGRESS NOTE  CC  [name], MD [address] [place] [address] Dr. Everardo Beals Dr. 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 Patient had a palpable mass at the 3:00 position. She was seen at the breast Center on 06/20/2012 she had a mammogram and ultrasound showing a 1.7 cm mass at the 3:00 position. 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 Tumor was grade 3.  #2 MRIs performed on 06/26/2012 showed no biopsy mass at 3:00 but also 2 other adjacent masses at 1 at 2:00 measuring 0.8 x 1.6 x 2.2 cm andalso 6 mm ill-defined mass posterior to the biopsy-proven malignancy.  #3 patient has underwent Port-A-Cath placement in order to begin neoadjuvant chemotherapy initially consisting of Adriamycin Cytoxan to be given dose dense for 4 cycles.  She underwent this from 07/17/12 through 09/04/12 and it was followed by weekly Taxol and carboplatinum for a total of 12 weeks starting on 09/11/12.  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  She will receive Gemzar Carbo starting 11/06/12.  The plan is for her to receive 3 cycles.    CURRENT THERAPY: Gemzar Carbo cycle 1 day 8  INTERVAL HISTORY: Jaime Murillo 52 y.o. female returns for evaluation following cycle one of Gemzar carbo.  She's doing well today.  She's had aches and pains in her bones that were mild this week.  She hasn't taken anything OTC for the pain, and does not want to.  She continues to have numbness.  She takes Gabapentin 300mg  in the morning and afternoon, and 400mg  in the evening. Since the increase, she has regained her motor function in her  fingertips.  She does continue to have numbness and occasional pain though.  Otherwise, she denies fevers, chills, nausea, vomiting, constipation, diarrhea, or further concerns.    MEDICAL HISTORY: Past Medical History  Diagnosis Date  . HSV infection   . NSVD (normal spontaneous vaginal delivery)   . Hypertension     takes HCTZ  . Urinary frequency   . Anemia     takes Ferrous Sulfate occasionally  . Breast cancer 2014    triple negative    ALLERGIES:  has No Known Allergies.  MEDICATIONS:  Current Outpatient Prescriptions  Medication Sig Dispense Refill  . Alum & Mag Hydroxide-Simeth (MAGIC MOUTHWASH W/LIDOCAINE) SOLN Take 5 mL by mouth 4 (four) times daily. Swish and spit  500 mL  0  . dexamethasone (DECADRON) 4 MG tablet Take 1 tablet (4 mg total) by mouth 2 (two) times daily with a meal. Take 2 tabs every day on the day after chemo an then take 2 tabs twice a day with food for 2 days.  60 tablet  0  . dexamethasone (DECADRON) 4 MG tablet Take 2 tablets (8 mg total) by mouth 2 (two) times daily with a meal. Take daily starting the day after chemotherapy for 2 days. Take with food.  30 tablet  1  . ferrous sulfate 325 (65 FE) MG tablet Take 325 mg by mouth daily with breakfast.      . gabapentin (NEURONTIN) 100 MG capsule Take 1 capsule (100 mg total) by mouth 3 (three) times daily.  90 capsule  0  . hydrochlorothiazide (HYDRODIURIL) 25 MG tablet Take 1 tablet (25 mg total) by mouth daily.  30 tablet  11  . lidocaine-prilocaine (EMLA) cream Apply 1 application topically as needed (apply to PAC site 1-2 hours prior to treatment.).      Marland Kitchen LORazepam (ATIVAN) 0.5 MG tablet Take 0.5 mg by mouth every 6 (six) hours as needed for anxiety (as needed for nausea and vomitting).      . LORazepam (ATIVAN) 0.5 MG tablet Take 1 tablet (0.5 mg total) by mouth every 6 (six) hours as needed (Nausea or vomiting).  30 tablet  0  . ondansetron (ZOFRAN) 8 MG tablet Take 1 tablet (8 mg total) by mouth  every 8 (eight) hours as needed for nausea (Take 1 pill by mouth twice a day as needed  for nausea and vomitting starting 3rd day after chemo).  20 tablet  3  . ondansetron (ZOFRAN) 8 MG tablet Take 1 tablet (8 mg total) by mouth 2 (two) times daily. Take two times a day starting the day after chemo for 2 days. Then take two times a day as needed for nausea or vomiting.  30 tablet  1  . oxyCODONE-acetaminophen (ROXICET) 5-325 MG per tablet Take 1-2 tablets by mouth every 4 (four) hours as needed for pain.  30 tablet  0  . prochlorperazine (COMPAZINE) 10 MG tablet Take 10 mg by mouth every 6 (six) hours as needed (for nausea and vomitting).      . prochlorperazine (COMPAZINE) 10 MG tablet Take 1 tablet (10 mg total) by mouth every 6 (six) hours as needed (Nausea or vomiting).  30 tablet  1  . prochlorperazine (COMPAZINE) 25 MG suppository Place 25 mg rectally every 12 (twelve) hours as needed for nausea (as needed for nausea and vomitting).      Marland Kitchen UNABLE TO FIND Apply 1 application topically once. 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  1 application  0  . valACYclovir (VALTREX) 500 MG tablet Take 1 tablet (500 mg total) by mouth daily as needed. For herpes  30 tablet  2   No current facility-administered medications for this visit.    SURGICAL HISTORY:  Past Surgical History  Procedure Laterality Date  . Tubal ligation  2003    LTL  . Portacath placement N/A 07/06/2012    Procedure: INSERTION PORT-A-CATH;  Surgeon: Almond Lint, MD;  Location: [facility] OR;  Service: General;  Laterality: N/A;    REVIEW OF SYSTEMS: A 10 point review of systems was conducted and is otherwise negative except for what is noted above.     PHYSICAL EXAMINATION: Blood pressure 123/77, pulse 89, temperature 98.2 F (36.8 C), temperature source Oral, resp. rate 18, height 5\' 3"  (1.6 m), weight 126 lb 9.6 oz (57.425 kg). Body mass index is 22.43 kg/(m^2). General:  Patient is a well appearing female in no acute distress HEENT: PERRLA, sclerae anicteric no conjunctival pallor, MMM Neck: supple, no palpable adenopathy Lungs: clear to auscultation bilaterally, no wheezes, rhonchi, or rales Cardiovascular: regular rate rhythm, S1, S2, no murmurs, rubs or gallops Abdomen: Soft, non-tender, non-distended, normoactive bowel sounds, no HSM Extremities: warm and well perfused, no clubbing, cyanosis, or edema Skin: No rashes or lesions Neuro: Non-focal Breast examination: Left breast no masses or nipple discharge Right breast reveals 3 discrete masses at the 3:00 position 2:00 position.  It is nontender there is no nipple discharge. softer. ECOG PERFORMANCE STATUS: 0 -  Asymptomatic  LABORATORY DATA: Lab Results  Component Value Date   WBC 17.7* 11/13/2012   HGB 11.1* 11/13/2012   HCT 32.9* 11/13/2012   MCV 95.6 11/13/2012   PLT 135* 11/13/2012      Chemistry      Component Value Date/Time   NA 140 11/13/2012 1313   NA 137 06/06/2012 0853   K 3.4* 11/13/2012 1313   K 3.8 06/06/2012 0853   CL 104 07/17/2012 1357   CL 102 06/06/2012 0853   CO2 27 11/13/2012 1313   CO2 26 06/06/2012 0853   BUN 15.8 11/13/2012 1313   BUN 12 06/06/2012 0853   CREATININE 0.7 11/13/2012 1313   CREATININE 0.70 06/06/2012 0853   CREATININE 0.85 04/24/2009 1114      Component Value Date/Time   CALCIUM 9.3 11/13/2012 1313   CALCIUM 9.3 06/06/2012 0853   ALKPHOS 160* 11/13/2012 1313   ALKPHOS 35* 06/06/2012 0853   AST 19 11/13/2012 1313   AST 14 06/06/2012 0853   ALT 23 11/13/2012 1313   ALT 9 06/06/2012 0853   BILITOT 0.27 11/13/2012 1313   BILITOT 0.8 06/06/2012 0853     ADDITIONAL INFORMATION: PROGNOSTIC INDICATORS - ACIS Results: IMMUNOHISTOCHEMICAL AND MORPHOMETRIC ANALYSIS BY THE AUTOMATED CELLULAR IMAGING SYSTEM (ACIS) Estrogen Receptor: 0%, NEGATIVE Progesterone Receptor: 0%, NEGATIVE Proliferation Marker Ki67: 95% COMMENT: The negative hormone receptor  study(ies) in this case have an internal positive control. REFERENCE RANGE ESTROGEN RECEPTOR NEGATIVE <1% POSITIVE =>1% PROGESTERONE RECEPTOR NEGATIVE <1% POSITIVE =>1% All controls stained appropriately Pecola Leisure MD Pathologist, Electronic Signature ( Signed 06/28/2012) CHROMOGENIC IN-SITU HYBRIDIZATION Results: HER-2/NEU BY CISH - NO AMPLIFICATION OF HER-2 DETECTED. RESULT RATIO OF HER2: CEP 17 SIGNALS 1.26 1 of 3 Duplicate copy FINAL for DEB, LOUDIN [phone]) ADDITIONAL INFORMATION:(continued) AVERAGE HER2 COPY NUMBER PER CELL 2.45 REFERENCE RANGE NEGATIVE HER2/Chr17 Ratio <2.0 and Average HER2 copy number <4.0 EQUIVOCAL HER2/Chr17 Ratio <2.0 and Average HER2 copy number 4.0 and <6.0 POSITIVE HER2/Chr17 Ratio >=2.0 and/or Average HER2 copy number >=6.0 Jimmy Picket MD Pathologist, Electronic Signature ( Signed 06/26/2012) FINAL DIAGNOSIS Diagnosis Breast, right, needle core biopsy, 3:00 position - INVASIVE DUCTAL CARCINOMA SEE COMMENT. - DUCTAL CARCINOMA IN SITU Microscopic Comment Although the grade of tumor is best assessed at resection, with these biopsies, both the invasive and in situ carcinoma are grade III. Breast prognostic studies are pending and will be reported in an addendum. The case is reviewed with Dr. Raynald Blend who concurs. (CRR:gt, 06/21/12) [place] RUND DO Pathologist, Electronic FINAL DIAGNOSIS Diagnosis Breast, right, needle core biopsy, mass - BENIGN BREAST TISSUE WITH FOCAL PSEUDOANGIOMATOUS STROMAL HYPERPLASIA (PASH). NO EVIDENCE OF MALIGNANCY. Microscopic Comment Called to the [facility] on 07/13/12. (JDP:caf 07/13/12)  RADIOGRAPHIC STUDIES:  Dg Chest 2 View  07/04/2012   *RADIOLOGY REPORT*  Clinical Data: Insertion of Port-A-Cath.  Breast cancer. Hypertension.  CHEST - 2 VIEW  Comparison: CT of the chest on 07/03/2012  Findings: Cardiomediastinal silhouette is within normal limits. The lungs are free of focal  consolidations and pleural effusions. Bony structures have a normal appearance.  Contrast is identified within the colon following CT of the abdomen.  IMPRESSION: Negative exam.   Original Report Authenticated By: Norva Pavlov, M.D.   Ct Chest W Contrast  07/03/2012   *RADIOLOGY REPORT*  Clinical Data:  Breast cancer.  No therapy yet.  Lower inner quadrant of right breast.  CT CHEST, ABDOMEN AND PELVIS WITH CONTRAST  Technique: Contiguous axial images of the chest abdomen and pelvis  were obtained after IV contrast administration.  Contrast: 80 ml Omnipaque-300  Comparison: Breast MR of 06/26/2012.  No prior CTs.  CT CHEST  Findings: Lung windows demonstrate minimal left lower lobe subpleural nodularity at 3 mm on image 40/series 4.  Right lung clear.  Soft tissue windows demonstrate small bilateral axillary nodes, without adenopathy.  No subpectoral adenopathy. No supraclavicular adenopathy.  Normal heart size, without pericardial effusion.  No central pulmonary embolism, on this non-dedicated study.  No mediastinal or hilar adenopathy.  No internal mammary adenopathy. Minimal residual thymic tissue in the anterior mediastinum (image 17/series 2).  IMPRESSION:  1. No acute process or evidence of metastatic disease in the chest. 2.  Probable subpleural lymph node in the left lower lobe. Recommend attention on follow-up.  CT ABDOMEN AND PELVIS  Findings:  Mild early phase of hepatic enhancement which decreases sensitivity for focal liver lesions.  None identified.  Normal spleen, stomach, pancreas, gallbladder, biliary tract, adrenal glands, kidneys. No retroperitoneal or retrocrural adenopathy.  Normal colon, appendix, and terminal ileum.  Normal small bowel without abdominal ascites.  No pelvic adenopathy.    Normal urinary bladder and uterus.  No adnexal mass.  Trace free pelvic fluid is likely physiologic.  Transitional right-sided lumbosacral anatomy.  Degenerative disc disease at the lumbosacral junction.   IMPRESSION:  No acute process or evidence of metastatic disease in the abdomen or pelvis.   Original Report Authenticated By: Jeronimo Greaves, M.D.   US Breast Right  06/29/2012   *RADIOLOGY REPORT*  Clinical Data:  52 year old female with newly diagnosed right breast cancer.  Suspicious abnormal enhancement anterior and superior to the biopsy-proven neoplasm - for second look ultrasound evaluation.  RIGHT BREAST ULTRASOUND  Comparison:  Prior mammograms, ultrasounds and MRI.  Findings: Ultrasound is performed, showing the biopsy-proven neoplasm at the 3 o'clock position of the right breast 4 cm from the nipple. No other discrete sonographic abnormalities are identified, specifically medial, superior, and anterior to the biopsy-proven neoplasm.  IMPRESSION: Patchy abnormal enhancement medial superior to the biopsy-proven neoplasm identified on recent MRI is not visualized sonographically as a discrete abnormality.  MR guided biopsy is recommended if breast conservation surgery is considered.  BI-RADS CATEGORY 6:  Known biopsy-proven malignancy - appropriate action should be taken.  RECOMMENDATION: MR guided right breast biopsy, which will be scheduled by our office.  I have discussed the findings and recommendations with the patient. Results were also provided in writing at the conclusion of the visit.  If applicable, a reminder letter will be sent to the patient regarding the next appointment.   Original Report Authenticated By: Harmon Pier, M.D.   US Breast Right  06/20/2012   *RADIOLOGY REPORT*  Clinical Data:  52 year old female for annual bilateral mammograms and palpable mass in the upper and inner right breast discovered on clinical examination.  DIGITAL DIAGNOSTIC BILATERAL MAMMOGRAM WITH CAD AND RIGHT BREAST ULTRASOUND:  Comparison:  03/21/2009 and prior mammograms dating back to 01/21/2005  Findings:  ACR Breast Density Category 4: The breast tissue is extremely dense.  Routine and spot compression views  of the right breast demonstrate no evidence of suspicious mass or distortion. Scattered punctate calcifications within the breasts bilaterally are again identified.  Mammographic images were processed with CAD.  On physical exam, thickening is identified at the 12 o'clock position of the right breast 4 cm from the nipple. A firm palpable mass identified at the 3 o'clock position of the right breast 4 cm from the nipple.  Ultrasound  is performed, showing no evidence of solid or cystic mass, distortion or abnormal areas of shadowing at the 12 o'clock position of the right breast. A 7 x 6 x 17 mm slightly irregular oval hypoechoic mass with calcifications is horizontally oriented at the 3 o'clock position of the right breast 4 cm from the nipple.  IMPRESSION: Indeterminate 7 x 6 x 17 mm hypoechoic mass at the 3 o'clock position of the right breast.  Tissue sampling is recommended.  No mammographic evidence of breast malignancy bilaterally.  No mammographic, palpable or sonographic abnormality at the 12 o'clock position of the right breast, in the area of palpable concern.  BI-RADS CATEGORY 4:  Suspicious abnormality - biopsy should be considered.  RECOMMENDATION:  Ultrasound guided right breast biopsy, which will be performed today but dictated in a separate report.  I have discussed the findings and recommendations with the patient. Results were also provided in writing at the conclusion of the visit.  If applicable, a reminder letter will be sent to the patient regarding the next appointment.   Original Report Authenticated By: Harmon Pier, M.D.   Ct Abdomen Pelvis W Contrast  07/03/2012   *RADIOLOGY REPORT*  Clinical Data:  Breast cancer.  No therapy yet.  Lower inner quadrant of right breast.  CT CHEST, ABDOMEN AND PELVIS WITH CONTRAST  Technique: Contiguous axial images of the chest abdomen and pelvis were obtained after IV contrast administration.  Contrast: 80 ml Omnipaque-300  Comparison: Breast MR of  06/26/2012.  No prior CTs.  CT CHEST  Findings: Lung windows demonstrate minimal left lower lobe subpleural nodularity at 3 mm on image 40/series 4.  Right lung clear.  Soft tissue windows demonstrate small bilateral axillary nodes, without adenopathy.  No subpectoral adenopathy. No supraclavicular adenopathy.  Normal heart size, without pericardial effusion.  No central pulmonary embolism, on this non-dedicated study.  No mediastinal or hilar adenopathy.  No internal mammary adenopathy. Minimal residual thymic tissue in the anterior mediastinum (image 17/series 2).  IMPRESSION:  1. No acute process or evidence of metastatic disease in the chest. 2.  Probable subpleural lymph node in the left lower lobe. Recommend attention on follow-up.  CT ABDOMEN AND PELVIS  Findings:  Mild early phase of hepatic enhancement which decreases sensitivity for focal liver lesions.  None identified.  Normal spleen, stomach, pancreas, gallbladder, biliary tract, adrenal glands, kidneys. No retroperitoneal or retrocrural adenopathy.  Normal colon, appendix, and terminal ileum.  Normal small bowel without abdominal ascites.  No pelvic adenopathy.    Normal urinary bladder and uterus.  No adnexal mass.  Trace free pelvic fluid is likely physiologic.  Transitional right-sided lumbosacral anatomy.  Degenerative disc disease at the lumbosacral junction.  IMPRESSION:  No acute process or evidence of metastatic disease in the abdomen or pelvis.   Original Report Authenticated By: Jeronimo Greaves, M.D.   Mr Breast Right W Wo Contrast  07/12/2012   *RADIOLOGY REPORT*  Clinical Data:  Known invasive mammary carcinoma in the right breast at 3 o'clock posteriorly.  Additional site of abnormal enhancement in the right upper inner quadrant closer to the nipple for possible biopsy.  ATTEMPTED MRI GUIDED VACUUM ASSISTED BIOPSY OF THE RIGHT BREAST WITHOUT AND WITH CONTRAST  Comparison: Previous exams.  Technique: Multiplanar, multisequence MR images of  the right breast were obtained prior to and following the intravenous administration of 9 ml of Mulithance.  I met with the patient, and we discussed the procedure of MRI guided biopsy, including risks, benefits, and  alternatives. Specifically, we discussed the risks of infection, bleeding, tissue injury, clip migration, and inadequate sampling.  Informed, written consent was given.  Using sterile technique, 2% Lidocaine, MRI guidance, and a 9 gauge vacuum assisted device, biopsy was attempted using a mediolateral approach.  However, the procedure was unsuccessful due to the extremely thin breast compression.  IMPRESSION:  Unsuccessful MRI guided vacuum-assisted biopsy of the right breast.  Patient was sent to the [facility] for further evaluation with ultrasound.   Original Report Authenticated By: Cain Saupe, M.D.   Mr Breast Bilateral W Wo Contrast  06/26/2012   *RADIOLOGY REPORT*  Clinical Data: History of new diagnosis of breast cancer from recent ultrasound guided core needle biopsy right breast 06/20/2012 demonstrating invasive ductal carcinoma with DCIS.  GFR 89, creatinine 0.7 on 06/06/2012.  BILATERAL BREAST MRI WITH AND WITHOUT CONTRAST  Technique: Multiplanar, multisequence MR images of both breasts were obtained prior to and following the intravenous administration of 10ml of multihance.  Three dimensional images were evaluated at the independent DynaCad workstation.  Comparison:  Recent mammograms and ultrasound.  Findings: Examination demonstrates mild background parenchymal enhancement.  Right breast:  There is evidence of an ovoid gently lobulated and somewhat ill-defined heterogeneously enhancing mass over the deep third of the inner midportion of the right breast representing patient's biopsy-proven malignancy.  This mass contains central clip artifact and measures approximately 0.9 x 1.9 x 1.0 cm. The anterior border of this mass is located 5.5 cm from the nipple. There is a 6 mm  slightly ill-defined enhancing ovoid mass 6 mm posterior lateral to the biopsy-proven malignancy lying just superficial to the pectoralis muscle.  There is also clumped non mass enhancement anterior and slightly superior to the biopsy- proven malignancy in the middle third at approximately the 2 o'clock position.  This measures approximately 0.8 x 1.6 x 1.2 cm and is located 3 cm from the nipple.  This is suspicious for multifocal disease.  No suspicious axillary or internal mammary lymph nodes.  Left breast:  No suspicious mass, enhancement or adenopathy.  IMPRESSION: Known biopsy-proven malignancy over the deep third of the inner mid right breast.  6 mm ill-defined enhancing ovoid mass 6 mm posterior lateral to the biopsy-proven malignancy and lying just superficial to the pectoralis muscle.  Clumped non mass enhancement anterior and superior to patient's known malignancy at approximately the 2 o'clock position measuring 0.8 x 1.6 x 2.2 cm.  Findings likely represent multi focal disease.  RECOMMENDATION: Recommend second look ultrasound and core biopsy if feasible of the clumped non mass enhancement at approximately the 2 o'clock position right breast anterior/superior to the known malignancy. If not seen by ultrasound, recommend proceeding to MR guided biopsy of this suspicious abnormality.  THREE-DIMENSIONAL MR IMAGE RENDERING ON INDEPENDENT WORKSTATION:  Three-dimensional MR images were rendered by post-processing of the original MR data on an independent workstation.  The three- dimensional MR images were interpreted, and findings were reported in the accompanying complete MRI report for this study.  BI-RADS CATEGORY 0:  Incomplete.  Need additional imaging evaluation and/or prior mammograms for comparison.   Original Report Authenticated By: Elberta Fortis, M.D.   Dg Chest Port 1 View  07/06/2012   *RADIOLOGY REPORT*  Clinical Data: Post left-sided port catheter insertion  PORTABLE CHEST - 1 VIEW   Comparison: 07/04/2012  Findings:  Grossly unchanged cardiac silhouette and mediastinal contours. Interval placement of a left anterior chest wall subclavian vein approach port-a-catheter with tip projecting over the superior cavoatrial  junction. Grossly unchanged symmetric biapical pleural parenchymal thickening.  No pneumothorax.  No focal airspace opacities.  No pleural effusion.  No evidence of edema.  Unchanged bones.  IMPRESSION: Interval placement of a left anterior chest wall subclavian vein approach port-a-catheter with tip overlying the superior cavoatrial junction.  No pneumothorax.   Original Report Authenticated By: Tacey Ruiz, MD   Mm Digital Diagnostic Bilat  06/20/2012   *RADIOLOGY REPORT*  Clinical Data:  52 year old female for annual bilateral mammograms and palpable mass in the upper and inner right breast discovered on clinical examination.  DIGITAL DIAGNOSTIC BILATERAL MAMMOGRAM WITH CAD AND RIGHT BREAST ULTRASOUND:  Comparison:  03/21/2009 and prior mammograms dating back to 01/21/2005  Findings:  ACR Breast Density Category 4: The breast tissue is extremely dense.  Routine and spot compression views of the right breast demonstrate no evidence of suspicious mass or distortion. Scattered punctate calcifications within the breasts bilaterally are again identified.  Mammographic images were processed with CAD.  On physical exam, thickening is identified at the 12 o'clock position of the right breast 4 cm from the nipple. A firm palpable mass identified at the 3 o'clock position of the right breast 4 cm from the nipple.  Ultrasound is performed, showing no evidence of solid or cystic mass, distortion or abnormal areas of shadowing at the 12 o'clock position of the right breast. A 7 x 6 x 17 mm slightly irregular oval hypoechoic mass with calcifications is horizontally oriented at the 3 o'clock position of the right breast 4 cm from the nipple.  IMPRESSION: Indeterminate 7 x 6 x 17 mm  hypoechoic mass at the 3 o'clock position of the right breast.  Tissue sampling is recommended.  No mammographic evidence of breast malignancy bilaterally.  No mammographic, palpable or sonographic abnormality at the 12 o'clock position of the right breast, in the area of palpable concern.  BI-RADS CATEGORY 4:  Suspicious abnormality - biopsy should be considered.  RECOMMENDATION:  Ultrasound guided right breast biopsy, which will be performed today but dictated in a separate report.  I have discussed the findings and recommendations with the patient. Results were also provided in writing at the conclusion of the visit.  If applicable, a reminder letter will be sent to the patient regarding the next appointment.   Original Report Authenticated By: Harmon Pier, M.D.   Mm Digital Diagnostic Unilat R  07/12/2012   *RADIOLOGY REPORT*  Clinical Data:  Ultrasound-guided core needle biopsy of a hypoechoic area at 2 o'clock 2 cm from the right nipple with clip placement.  Known invasive mammary carcinoma in the right breast. Unsuccessful right breast MRI guided biopsy earlier today.  DIGITAL DIAGNOSTIC RIGHT MAMMOGRAM  Comparison:  Previous exams.  Findings:  Films are performed following ultrasound guided biopsy of a small hypoechoic area at 2 o'clock 2 cm from the right nipple. The ribbon clip is positioned within the area of concern. The ribbon clip is approximately 2.6 cm anterior to the top hat clip from the original biopsy on 06/20/2012.  IMPRESSION: Appropriate clip placement following ultrasound-guided core needle biopsy of a hypoechoic area at 2 o'clock 2 cm in the right nipple.   Original Report Authenticated By: Cain Saupe, M.D.   Mm Digital Diagnostic Unilat R  06/20/2012   *RADIOLOGY REPORT*  Clinical Data:  52 year old female - evaluate clip placement following ultrasound guided right breast biopsy.  DIGITAL DIAGNOSTIC RIGHT MAMMOGRAM  Comparison:  Previous exams.  Findings:  Films are performed  following ultrasound guided  biopsy of the 7 x 6 x 17 mm hypoechoic mass at the 3 o'clock position of the right breast 4 cm from the nipple. The T shaped biopsy clip is in satisfactory position.  IMPRESSION: Satisfactory clip placement following ultrasound guided right breast biopsy.  Pathology will be followed.   Original Report Authenticated By: Harmon Pier, M.D.   Dg Fluoro Guide Cv Line-no Report  07/06/2012   CLINICAL DATA: port placement   FLOURO GUIDE CV LINE  Fluoroscopy was utilized by the requesting physician.  No radiographic  interpretation.    [place] Rt Breast Bx W Loc Dev 1st Lesion Img Bx Spec US Guide  07/13/2012   **ADDENDUM** CREATED: 07/13/2012 14:11:45  I spoke with the patient by telephone on 07/13/2012 to discuss pathology results.  Pathology demonstrates  pseudoangiomatous stromal hyperplasia.  The findings were called to the office of Dr. Donell Beers. Although the imaging findings could be related to pseudoangiomatous stromal hyperplasia, excision of the anterior biopsy site is suggested at the time of patient's definitive surgery.  The patient is considering unilateral or bilateral mastectomy at this time.  The patient reports no problems at the biopsy site.  All questions were answered.  Recommendations:  Excision of the biopsy site. The patient is scheduled to begin chemotherapy in 4 days.  **END ADDENDUM** SIGNED BY: Blair Hailey. Manson Passey, M.D.  07/12/2012   *RADIOLOGY REPORT*  Clinical Data:  Unsuccessful MRI guided biopsy of the area of abnormal enhancement in the left anterior upper inner quadrant, anterior to the known invasive mammary carcinoma.  Second look ultrasound performed and a 5 x 4 x 4 mm hypoechoic area found at 2 o'clock 2 cm from the right nipple which will be biopsied.  ULTRASOUND GUIDED VACUUM ASSISTED CORE BIOPSY OF THE RIGHT BREAST  Sonography of the right upper inner quadrant was performed.  There is an ill-defined hypoechoic 5 x 4 x 4 mm area at 2 o'clock 2 cm from the  right nipple which will be biopsied with ultrasound guidance.  The patient and I discussed the procedure of ultrasound-guided biopsy, including benefits and alternatives.  We discussed the high likelihood of a successful procedure. We discussed the risks of the procedure including infection, bleeding, tissue injury, clip migration, and inadequate sampling.  Written informed consent was given.  Using sterile technique, 2% lidocaine, ultrasound guidance, and a 12 gauge vacuum assisted needle, biopsy was performed of the hypoechoic area at 2 o'clock 2 cm from the right nipple using a caudocranial approach.  At the conclusion of the procedure, a within tissue marker clip was deployed into the biopsy cavity. Follow-up 2-view mammogram was performed and dictated separately.  IMPRESSION: Ultrasound-guided biopsy of a 5 x 4 x 4 mm hypoechoic area at 2 o'clock 2 cm from the right nipple.  No apparent complications.   Original Report Authenticated By: Cain Saupe, M.D.   [place] Rt Breast Bx W Loc Dev 1st Lesion Img Bx Spec US Guide  06/21/2012   *RADIOLOGY REPORT*  Clinical Data:  Indeterminate 7 x 6 x 17 mm hypoechoic mass in the inner right breast - for tissue sampling.  ULTRASOUND GUIDED VACUUM ASSISTED CORE BIOPSY OF THE RIGHT BREAST  Comparison: Previous exams.  I met with the patient and we discussed the procedure of ultrasound- guided biopsy, including benefits and alternatives.  We discussed the high likelihood of a successful procedure. We discussed the risks of the procedure including infection, bleeding, tissue injury, clip migration, and inadequate sampling.  Informed written consent was  given.  Using sterile technique, 2% lidocaine ultrasound guidance and a 12 gauge vacuum assisted needle biopsy was performed of the 7 x 6 x 17 mm hypoechoic mass at the 3 o'clock position of the right breast 4 cm from the nipple using a lateral approach.  At the conclusion of the procedure, a T shaped tissue marker clip was  deployed into the biopsy cavity.  Follow-up 2-view mammogram was performed and dictated separately.  IMPRESSION: Ultrasound-guided biopsy of right breast mass.  No apparent complications.  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 Histology correlates with imaging findings.  The patient was contacted by phone on 06/21/2012 and these results given to her which she understood. Her questions were answered. She was encouraged to obtain breast cancer educational material from [facility] at her convenience. The patient had no complaints with her biopsy site.  Recommend surgery/oncology consultation.  An appointment at [facility] has been scheduled for 06/28/2012. Recommend bilateral breast MRI, which has been scheduled for 06/26/2012. The patient was informed of these appointments.   Original Report Authenticated By: Harmon Pier, M.D.    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 Patient had other areas seen on MRI biopsy of the second mass was negative. Patient has had her Port-A-Cath placed. She is proceeding neoadjuvant chemotherapy to possibly conserve her breast. Chemotherapy consisted of Adriamycin Cytoxan given dose dense for 4 cycles starting on 07/17/2012 with Neulasta support.  This was followed by Taxol/Carbo which started on 09/11/12.  She received 7 cycles of this and it was discontinued early due to neuropathy.  She will now receive Gemzar/carbo beginning 11/06/12.    #2 we discussed risks benefits and side effects of chemotherapy. She has had an echocardiogram as well as chemotherapy teaching class. Port-A-Cath was placed by Dr. Donell Beers. The port is functioning well.  #3 Gemzar/Carbo starting 11/06/12  She will receive 3 cycles.    #4 vaginal herpes outbreak  #5 neuropathy  PLAN:  #1  Jaime Murillo is doing well.  Her labs are stable and I reviewed them with her in  detail.    #2 She will continue Gabapentin 300mg  in the morning and afternoon and 400mg  at night as her neuropathy is improving.    #3 I ordered her MRI breasts, f/u with Dr. Luz Brazen, and requested scheduling of her last treatment time and appointment today.  She will return to clinic on 10/27 for cycle 2 of chemotherapy.    All questions were answered. The patient knows to call the clinic with any problems, questions or concerns. We can certainly see the patient much sooner if necessary.  I spent 25 minutes counseling the patient face to face. The total time spent in the appointment was 30 minutes.  Illa Level, NP Medical Oncology [facility] [phone]   11/14/2012, 3:56 PM

## 2012-11-14 ENCOUNTER — Telehealth: Payer: Self-pay | Admitting: *Deleted

## 2012-11-14 NOTE — Telephone Encounter (Signed)
Called patient to follow up on pain. Pt states that she took 2 ibuprophen last night and was still hurting . Pt called "on call" MD who told her it was OK to take oxycodone. Pt states she decided to "take a hot shower and sleep through the pain". Pt states she is not hurting this morning and remembers that she took Claritin last time when she was on Neulasta. She plans to take Claritin next time she takes neulasta. I encouraged her to take Roxicet as prescribed for pain.

## 2012-11-15 ENCOUNTER — Telehealth: Payer: Self-pay | Admitting: *Deleted

## 2012-11-15 NOTE — Telephone Encounter (Signed)
Pt called with request for Refill of Gabapentin. Pt taking 3 (100mg ) pills BID and 4 (100mg )pills at night. Pt will be run out thurs. Requesting refill for 1 month.. Pt also would like massage, reviewed with MD- VO ok for pt to receive massage.- Recommended pt call Orinda Kenner regarding a voucher/paperwork for MD to authorize massage. Pt advised she would call [facility] tomorrow am to s/w Steward Drone. Will review with MD

## 2012-11-16 ENCOUNTER — Other Ambulatory Visit: Payer: Self-pay | Admitting: Emergency Medicine

## 2012-11-16 DIAGNOSIS — G629 Polyneuropathy, unspecified: Secondary | ICD-10-CM

## 2012-11-16 MED ORDER — GABAPENTIN 100 MG PO CAPS: ORAL_CAPSULE | ORAL | Status: DC

## 2012-11-16 NOTE — Telephone Encounter (Signed)
Ok for massage and refill of gabapentin refills

## 2012-11-20 ENCOUNTER — Ambulatory Visit (HOSPITAL_BASED_OUTPATIENT_CLINIC_OR_DEPARTMENT_OTHER): Payer: Medicaid Other | Admitting: Adult Health

## 2012-11-20 ENCOUNTER — Other Ambulatory Visit (HOSPITAL_BASED_OUTPATIENT_CLINIC_OR_DEPARTMENT_OTHER): Payer: Medicaid Other | Admitting: Lab

## 2012-11-20 ENCOUNTER — Encounter: Payer: Self-pay | Admitting: Oncology

## 2012-11-20 ENCOUNTER — Encounter: Payer: Self-pay | Admitting: Adult Health

## 2012-11-20 ENCOUNTER — Ambulatory Visit (HOSPITAL_BASED_OUTPATIENT_CLINIC_OR_DEPARTMENT_OTHER): Payer: Medicaid Other

## 2012-11-20 VITALS — BP 122/74 | HR 81 | Temp 98.4°F | Resp 20 | Ht 63.0 in | Wt 126.6 lb

## 2012-11-20 DIAGNOSIS — Z23 Encounter for immunization: Secondary | ICD-10-CM

## 2012-11-20 DIAGNOSIS — Z5111 Encounter for antineoplastic chemotherapy: Secondary | ICD-10-CM

## 2012-11-20 DIAGNOSIS — C50919 Malignant neoplasm of unspecified site of unspecified female breast: Secondary | ICD-10-CM

## 2012-11-20 DIAGNOSIS — C50311 Malignant neoplasm of lower-inner quadrant of right female breast: Secondary | ICD-10-CM

## 2012-11-20 DIAGNOSIS — G609 Hereditary and idiopathic neuropathy, unspecified: Secondary | ICD-10-CM

## 2012-11-20 DIAGNOSIS — A6004 Herpesviral vulvovaginitis: Secondary | ICD-10-CM

## 2012-11-20 DIAGNOSIS — Z171 Estrogen receptor negative status [ER-]: Secondary | ICD-10-CM

## 2012-11-20 LAB — CBC WITH DIFFERENTIAL/PLATELET
Basophils Absolute: 0 10*3/uL (ref 0.0–0.1)
EOS%: 0 % (ref 0.0–7.0)
Eosinophils Absolute: 0 10*3/uL (ref 0.0–0.5)
HGB: 10.8 g/dL — ABNORMAL LOW (ref 11.6–15.9)
LYMPH%: 18.3 % (ref 14.0–49.7)
MCV: 95 fL (ref 79.5–101.0)
MONO#: 0.7 10*3/uL (ref 0.1–0.9)
NEUT#: 3.7 10*3/uL (ref 1.5–6.5)
Platelets: 145 10*3/uL (ref 145–400)
RBC: 3.37 10*6/uL — ABNORMAL LOW (ref 3.70–5.45)
RDW: 16.4 % — ABNORMAL HIGH (ref 11.2–14.5)
WBC: 5.4 10*3/uL (ref 3.9–10.3)
nRBC: 0 % (ref 0–0)

## 2012-11-20 LAB — COMPREHENSIVE METABOLIC PANEL (CC13)
ALT: 29 U/L (ref 0–55)
AST: 27 U/L (ref 5–34)
Anion Gap: 9 mEq/L (ref 3–11)
BUN: 17 mg/dL (ref 7.0–26.0)
CO2: 29 mEq/L (ref 22–29)
Calcium: 9.4 mg/dL (ref 8.4–10.4)
Chloride: 104 mEq/L (ref 98–109)
Sodium: 141 mEq/L (ref 136–145)
Total Bilirubin: 0.3 mg/dL (ref 0.20–1.20)
Total Protein: 6.9 g/dL (ref 6.4–8.3)

## 2012-11-20 MED ORDER — SODIUM CHLORIDE 0.9 % IV SOLN
800.0000 mg/m2 | Freq: Once | INTRAVENOUS | Status: AC
  Administered 2012-11-20: 1254 mg via INTRAVENOUS
  Filled 2012-11-20: qty 32.98

## 2012-11-20 MED ORDER — HEPARIN SOD (PORK) LOCK FLUSH 100 UNIT/ML IV SOLN
500.0000 [IU] | Freq: Once | INTRAVENOUS | Status: AC | PRN
  Filled 2012-11-20: qty 5

## 2012-11-20 MED ORDER — DEXAMETHASONE SODIUM PHOSPHATE 10 MG/ML IJ SOLN
10.0000 mg | Freq: Once | INTRAMUSCULAR | Status: AC
  Administered 2012-11-20: 10 mg via INTRAVENOUS

## 2012-11-20 MED ORDER — SODIUM CHLORIDE 0.9 % IV SOLN
Freq: Once | INTRAVENOUS | Status: AC
  Administered 2012-11-20: 15:00:00 via INTRAVENOUS

## 2012-11-20 MED ORDER — SODIUM CHLORIDE 0.9 % IV SOLN
220.6000 mg | Freq: Once | INTRAVENOUS | Status: AC
  Administered 2012-11-20: 220 mg via INTRAVENOUS
  Filled 2012-11-20: qty 22

## 2012-11-20 MED ORDER — SODIUM CHLORIDE 0.9 % IJ SOLN
10.0000 mL | INTRAMUSCULAR | Status: DC | PRN
  Filled 2012-11-20: qty 10

## 2012-11-20 MED ORDER — ONDANSETRON 8 MG/NS 50 ML IVPB
INTRAVENOUS | Status: AC
  Filled 2012-11-20: qty 8

## 2012-11-20 MED ORDER — DEXAMETHASONE SODIUM PHOSPHATE 10 MG/ML IJ SOLN
INTRAMUSCULAR | Status: AC
  Filled 2012-11-20: qty 1

## 2012-11-20 MED ORDER — INFLUENZA VAC SPLIT QUAD 0.5 ML IM SUSP
0.5000 mL | INTRAMUSCULAR | Status: AC
  Administered 2012-11-20: 0.5 mL via INTRAMUSCULAR
  Filled 2012-11-20: qty 0.5

## 2012-11-20 MED ORDER — ONDANSETRON 8 MG/50ML IVPB (CHCC)
8.0000 mg | Freq: Once | INTRAVENOUS | Status: AC
  Administered 2012-11-20: 8 mg via INTRAVENOUS

## 2012-11-20 NOTE — Progress Notes (Signed)
OFFICE PROGRESS NOTE  CC  Jaime Edwards, MD [address] [place] [address] Dr. Everardo Beals Dr. 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 Patient had a palpable mass at the 3:00 position. She was seen at the breast Center on 06/20/2012 she had a mammogram and ultrasound showing a 1.7 cm mass at the 3:00 position. 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 Tumor was grade 3.  #2 MRIs performed on 06/26/2012 showed no biopsy mass at 3:00 but also 2 other adjacent masses at 1 at 2:00 measuring 0.8 x 1.6 x 2.2 cm andalso 6 mm ill-defined mass posterior to the biopsy-proven malignancy.  #3 patient has underwent Port-A-Cath placement in order to begin neoadjuvant chemotherapy initially consisting of Adriamycin Cytoxan to be given dose dense for 4 cycles.  She underwent this from 07/17/12 through 09/04/12 and it was followed by weekly Taxol and carboplatinum for a total of 12 weeks starting on 09/11/12.  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  She will receive Gemzar Carbo starting 11/06/12.  The plan is for her to receive 3 cycles.    CURRENT THERAPY: Gemzar Carbo cycle 2 day 1  INTERVAL HISTORY: Jaime Murillo 52 y.o. female returns for evaluation prior to her second cycle of Gemzar [place]. She is feeling well today.  We called in Gabapentin to her pharmacy last week.  She has yet to pick it up, and is c/o numbness in her fingertips and toes because she hasn't taken since Friday.  She denies fevers, chills, nausea, vomiting, constipation, diarrhea, or any further concerns.    MEDICAL HISTORY: Past Medical History   Diagnosis Date  . HSV infection   . NSVD (normal spontaneous vaginal delivery)   . Hypertension     takes HCTZ  . Urinary frequency   . Anemia     takes Ferrous Sulfate occasionally  . Breast cancer 2014    triple negative    ALLERGIES:  has No Known Allergies.  MEDICATIONS:  Current Outpatient Prescriptions  Medication Sig Dispense Refill  . Alum & Mag Hydroxide-Simeth (MAGIC MOUTHWASH W/LIDOCAINE) SOLN Take 5 mL by mouth 4 (four) times daily. Swish and spit  500 mL  0  . dexamethasone (DECADRON) 4 MG tablet Take 1 tablet (4 mg total) by mouth 2 (two) times daily with a meal. Take 2 tabs every day on the day after chemo an then take 2 tabs twice a day with food for 2 days.  60 tablet  0  . dexamethasone (DECADRON) 4 MG tablet Take 2 tablets (8 mg total) by mouth 2 (two) times daily with a meal. Take daily starting the day after chemotherapy for 2 days. Take with food.  30 tablet  1  . ferrous sulfate 325 (65 FE) MG tablet Take 325 mg by mouth daily with breakfast.      . gabapentin (NEURONTIN) 100 MG capsule Take 3 tablets (300mg s total) twice a day and take 4 tablets (400mg s total) at bedtime.  300 capsule  2  . hydrochlorothiazide (HYDRODIURIL) 25 MG tablet Take 1 tablet (25 mg total) by mouth daily.  30 tablet  11  . lidocaine-prilocaine (EMLA) cream Apply 1  application topically as needed (apply to [facility] site 1-2 hours prior to treatment.).      Marland Kitchen LORazepam (ATIVAN) 0.5 MG tablet Take 1 tablet (0.5 mg total) by mouth every 6 (six) hours as needed (Nausea or vomiting).  30 tablet  0  . ondansetron (ZOFRAN) 8 MG tablet Take 1 tablet (8 mg total) by mouth every 8 (eight) hours as needed for nausea (Take 1 pill by mouth twice a day as needed  for nausea and vomitting starting 3rd day after chemo).  20 tablet  3  . ondansetron (ZOFRAN) 8 MG tablet Take 1 tablet (8 mg total) by mouth 2 (two) times daily. Take two times a day starting the day after chemo for 2 days. Then take two times a day  as needed for nausea or vomiting.  30 tablet  1  . oxyCODONE-acetaminophen (ROXICET) 5-325 MG per tablet Take 1-2 tablets by mouth every 4 (four) hours as needed for pain.  30 tablet  0  . prochlorperazine (COMPAZINE) 10 MG tablet Take 1 tablet (10 mg total) by mouth every 6 (six) hours as needed (Nausea or vomiting).  30 tablet  1  . prochlorperazine (COMPAZINE) 25 MG suppository Place 25 mg rectally every 12 (twelve) hours as needed for nausea (as needed for nausea and vomitting).      Marland Kitchen UNABLE TO FIND Apply 1 application topically once. 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  1 application  0  . valACYclovir (VALTREX) 500 MG tablet Take 1 tablet (500 mg total) by mouth daily as needed. For herpes  30 tablet  2   No current facility-administered medications for this visit.   Facility-Administered Medications Ordered in Other Visits  Medication Dose Route Frequency Provider Last Rate Last Dose  . sodium chloride 0.9 % injection 10 mL  10 mL Intracatheter PRN Illa Level, NP        SURGICAL HISTORY:  Past Surgical History  Procedure Laterality Date  . Tubal ligation  2003    LTL  . Portacath placement N/A 07/06/2012    Procedure: INSERTION PORT-A-CATH;  Surgeon: Almond Lint, MD;  Location: [facility] OR;  Service: General;  Laterality: N/A;    REVIEW OF SYSTEMS: A 10 point review of systems was conducted and is otherwise negative except for what is noted above.     PHYSICAL EXAMINATION: Blood pressure 122/74, pulse 81, temperature 98.4 F (36.9 C), temperature source Oral, resp. rate 20, height 5\' 3"  (1.6 m), weight 126 lb 9.6 oz (57.425 kg). Body mass index is 22.43 kg/(m^2). General: Patient is a well appearing female in no acute distress HEENT: PERRLA, sclerae anicteric no conjunctival pallor, MMM Neck: supple, no palpable adenopathy Lungs: clear to auscultation bilaterally, no wheezes, rhonchi, or rales Cardiovascular: regular  rate rhythm, S1, S2, no murmurs, rubs or gallops Abdomen: Soft, non-tender, non-distended, normoactive bowel sounds, no HSM Extremities: warm and well perfused, no clubbing, cyanosis, or edema Skin: No rashes or lesions Neuro: Non-focal Breast examination: Left breast no masses or nipple discharge Right breast reveals 3 discrete masses at the 3:00 position 2:00 position.  It is nontender there is no nipple discharge. softer. ECOG PERFORMANCE STATUS: 0 - Asymptomatic  LABORATORY DATA: Lab Results  Component Value Date   WBC 5.4 11/20/2012   HGB 10.8* 11/20/2012   HCT 32.0* 11/20/2012   MCV 95.0 11/20/2012   PLT 145 11/20/2012      Chemistry      Component Value  Date/Time   NA 141 11/20/2012 1400   NA 137 06/06/2012 0853   K 3.5 11/20/2012 1400   K 3.8 06/06/2012 0853   CL 104 07/17/2012 1357   CL 102 06/06/2012 0853   CO2 29 11/20/2012 1400   CO2 26 06/06/2012 0853   BUN 17.0 11/20/2012 1400   BUN 12 06/06/2012 0853   CREATININE 0.8 11/20/2012 1400   CREATININE 0.70 06/06/2012 0853   CREATININE 0.85 04/24/2009 1114      Component Value Date/Time   CALCIUM 9.4 11/20/2012 1400   CALCIUM 9.3 06/06/2012 0853   ALKPHOS 81 11/20/2012 1400   ALKPHOS 35* 06/06/2012 0853   AST 27 11/20/2012 1400   AST 14 06/06/2012 0853   ALT 29 11/20/2012 1400   ALT 9 06/06/2012 0853   BILITOT 0.30 11/20/2012 1400   BILITOT 0.8 06/06/2012 0853     ADDITIONAL INFORMATION: PROGNOSTIC INDICATORS - ACIS Results: IMMUNOHISTOCHEMICAL AND MORPHOMETRIC ANALYSIS BY THE AUTOMATED CELLULAR IMAGING SYSTEM (ACIS) Estrogen Receptor: 0%, NEGATIVE Progesterone Receptor: 0%, NEGATIVE Proliferation Marker Ki67: 95% COMMENT: The negative hormone receptor study(ies) in this case have an internal positive control. REFERENCE RANGE ESTROGEN RECEPTOR NEGATIVE <1% POSITIVE =>1% PROGESTERONE RECEPTOR NEGATIVE <1% POSITIVE =>1% All controls stained appropriately Pecola Leisure MD Pathologist, Electronic Signature (  Signed 06/28/2012) CHROMOGENIC IN-SITU HYBRIDIZATION Results: HER-2/NEU BY CISH - NO AMPLIFICATION OF HER-2 DETECTED. RESULT RATIO OF HER2: CEP 17 SIGNALS 1.26 1 of 3 Duplicate copy FINAL for TAEGAN, HAIDER [id number]) ADDITIONAL INFORMATION:(continued) AVERAGE HER2 COPY NUMBER PER CELL 2.45 REFERENCE RANGE NEGATIVE HER2/Chr17 Ratio <2.0 and Average HER2 copy number <4.0 EQUIVOCAL HER2/Chr17 Ratio <2.0 and Average HER2 copy number 4.0 and <6.0 POSITIVE HER2/Chr17 Ratio >=2.0 and/or Average HER2 copy number >=6.0 Jimmy Picket MD Pathologist, Electronic Signature ( Signed 06/26/2012) FINAL DIAGNOSIS Diagnosis Breast, right, needle core biopsy, 3:00 position - INVASIVE DUCTAL CARCINOMA SEE COMMENT. - DUCTAL CARCINOMA IN SITU Microscopic Comment Although the grade of tumor is best assessed at resection, with these biopsies, both the invasive and in situ carcinoma are grade III. Breast prognostic studies are pending and will be reported in an addendum. The case is reviewed with Dr. Raynald Blend who concurs. (CRR:gt, 06/21/12) [place] RUND DO Pathologist, Electronic FINAL DIAGNOSIS Diagnosis Breast, right, needle core biopsy, mass - BENIGN BREAST TISSUE WITH FOCAL PSEUDOANGIOMATOUS STROMAL HYPERPLASIA (PASH). NO EVIDENCE OF MALIGNANCY. Microscopic Comment Called to the [facility] on 07/13/12. (JDP:caf 07/13/12)  RADIOGRAPHIC STUDIES:  Dg Chest 2 View  07/04/2012   *RADIOLOGY REPORT*  Clinical Data: Insertion of Port-A-Cath.  Breast cancer. Hypertension.  CHEST - 2 VIEW  Comparison: CT of the chest on 07/03/2012  Findings: Cardiomediastinal silhouette is within normal limits. The lungs are free of focal consolidations and pleural effusions. Bony structures have a normal appearance.  Contrast is identified within the colon following CT of the abdomen.  IMPRESSION: Negative exam.   Original Report Authenticated By: Norva Pavlov, M.D.   Ct Chest W Contrast  07/03/2012    *RADIOLOGY REPORT*  Clinical Data:  Breast cancer.  No therapy yet.  Lower inner quadrant of right breast.  CT CHEST, ABDOMEN AND PELVIS WITH CONTRAST  Technique: Contiguous axial images of the chest abdomen and pelvis were obtained after IV contrast administration.  Contrast: 80 ml Omnipaque-300  Comparison: Breast MR of 06/26/2012.  No prior CTs.  CT CHEST  Findings: Lung windows demonstrate minimal left lower lobe subpleural nodularity at 3 mm on image 40/series 4.  Right lung clear.  Soft  tissue windows demonstrate small bilateral axillary nodes, without adenopathy.  No subpectoral adenopathy. No supraclavicular adenopathy.  Normal heart size, without pericardial effusion.  No central pulmonary embolism, on this non-dedicated study.  No mediastinal or hilar adenopathy.  No internal mammary adenopathy. Minimal residual thymic tissue in the anterior mediastinum (image 17/series 2).  IMPRESSION:  1. No acute process or evidence of metastatic disease in the chest. 2.  Probable subpleural lymph node in the left lower lobe. Recommend attention on follow-up.  CT ABDOMEN AND PELVIS  Findings:  Mild early phase of hepatic enhancement which decreases sensitivity for focal liver lesions.  None identified.  Normal spleen, stomach, pancreas, gallbladder, biliary tract, adrenal glands, kidneys. No retroperitoneal or retrocrural adenopathy.  Normal colon, appendix, and terminal ileum.  Normal small bowel without abdominal ascites.  No pelvic adenopathy.    Normal urinary bladder and uterus.  No adnexal mass.  Trace free pelvic fluid is likely physiologic.  Transitional right-sided lumbosacral anatomy.  Degenerative disc disease at the lumbosacral junction.  IMPRESSION:  No acute process or evidence of metastatic disease in the abdomen or pelvis.   Original Report Authenticated By: Jeronimo Greaves, M.D.   US Breast Right  06/29/2012   *RADIOLOGY REPORT*  Clinical Data:  52 year old female with newly diagnosed right breast cancer.   Suspicious abnormal enhancement anterior and superior to the biopsy-proven neoplasm - for second look ultrasound evaluation.  RIGHT BREAST ULTRASOUND  Comparison:  Prior mammograms, ultrasounds and MRI.  Findings: Ultrasound is performed, showing the biopsy-proven neoplasm at the 3 o'clock position of the right breast 4 cm from the nipple. No other discrete sonographic abnormalities are identified, specifically medial, superior, and anterior to the biopsy-proven neoplasm.  IMPRESSION: Patchy abnormal enhancement medial superior to the biopsy-proven neoplasm identified on recent MRI is not visualized sonographically as a discrete abnormality.  MR guided biopsy is recommended if breast conservation surgery is considered.  BI-RADS CATEGORY 6:  Known biopsy-proven malignancy - appropriate action should be taken.  RECOMMENDATION: MR guided right breast biopsy, which will be scheduled by our office.  I have discussed the findings and recommendations with the patient. Results were also provided in writing at the conclusion of the visit.  If applicable, a reminder letter will be sent to the patient regarding the next appointment.   Original Report Authenticated By: Harmon Pier, M.D.   US Breast Right  06/20/2012   *RADIOLOGY REPORT*  Clinical Data:  52 year old female for annual bilateral mammograms and palpable mass in the upper and inner right breast discovered on clinical examination.  DIGITAL DIAGNOSTIC BILATERAL MAMMOGRAM WITH CAD AND RIGHT BREAST ULTRASOUND:  Comparison:  03/21/2009 and prior mammograms dating back to 01/21/2005  Findings:  ACR Breast Density Category 4: The breast tissue is extremely dense.  Routine and spot compression views of the right breast demonstrate no evidence of suspicious mass or distortion. Scattered punctate calcifications within the breasts bilaterally are again identified.  Mammographic images were processed with CAD.  On physical exam, thickening is identified at the 12 o'clock  position of the right breast 4 cm from the nipple. A firm palpable mass identified at the 3 o'clock position of the right breast 4 cm from the nipple.  Ultrasound is performed, showing no evidence of solid or cystic mass, distortion or abnormal areas of shadowing at the 12 o'clock position of the right breast. A 7 x 6 x 17 mm slightly irregular oval hypoechoic mass with calcifications is horizontally oriented at the 3 o'clock position of  the right breast 4 cm from the nipple.  IMPRESSION: Indeterminate 7 x 6 x 17 mm hypoechoic mass at the 3 o'clock position of the right breast.  Tissue sampling is recommended.  No mammographic evidence of breast malignancy bilaterally.  No mammographic, palpable or sonographic abnormality at the 12 o'clock position of the right breast, in the area of palpable concern.  BI-RADS CATEGORY 4:  Suspicious abnormality - biopsy should be considered.  RECOMMENDATION:  Ultrasound guided right breast biopsy, which will be performed today but dictated in a separate report.  I have discussed the findings and recommendations with the patient. Results were also provided in writing at the conclusion of the visit.  If applicable, a reminder letter will be sent to the patient regarding the next appointment.   Original Report Authenticated By: Harmon Pier, M.D.   Ct Abdomen Pelvis W Contrast  07/03/2012   *RADIOLOGY REPORT*  Clinical Data:  Breast cancer.  No therapy yet.  Lower inner quadrant of right breast.  CT CHEST, ABDOMEN AND PELVIS WITH CONTRAST  Technique: Contiguous axial images of the chest abdomen and pelvis were obtained after IV contrast administration.  Contrast: 80 ml Omnipaque-300  Comparison: Breast MR of 06/26/2012.  No prior CTs.  CT CHEST  Findings: Lung windows demonstrate minimal left lower lobe subpleural nodularity at 3 mm on image 40/series 4.  Right lung clear.  Soft tissue windows demonstrate small bilateral axillary nodes, without adenopathy.  No subpectoral adenopathy.  No supraclavicular adenopathy.  Normal heart size, without pericardial effusion.  No central pulmonary embolism, on this non-dedicated study.  No mediastinal or hilar adenopathy.  No internal mammary adenopathy. Minimal residual thymic tissue in the anterior mediastinum (image 17/series 2).  IMPRESSION:  1. No acute process or evidence of metastatic disease in the chest. 2.  Probable subpleural lymph node in the left lower lobe. Recommend attention on follow-up.  CT ABDOMEN AND PELVIS  Findings:  Mild early phase of hepatic enhancement which decreases sensitivity for focal liver lesions.  None identified.  Normal spleen, stomach, pancreas, gallbladder, biliary tract, adrenal glands, kidneys. No retroperitoneal or retrocrural adenopathy.  Normal colon, appendix, and terminal ileum.  Normal small bowel without abdominal ascites.  No pelvic adenopathy.    Normal urinary bladder and uterus.  No adnexal mass.  Trace free pelvic fluid is likely physiologic.  Transitional right-sided lumbosacral anatomy.  Degenerative disc disease at the lumbosacral junction.  IMPRESSION:  No acute process or evidence of metastatic disease in the abdomen or pelvis.   Original Report Authenticated By: Jeronimo Greaves, M.D.   Mr Breast Right W Wo Contrast  07/12/2012   *RADIOLOGY REPORT*  Clinical Data:  Known invasive mammary carcinoma in the right breast at 3 o'clock posteriorly.  Additional site of abnormal enhancement in the right upper inner quadrant closer to the nipple for possible biopsy.  ATTEMPTED MRI GUIDED VACUUM ASSISTED BIOPSY OF THE RIGHT BREAST WITHOUT AND WITH CONTRAST  Comparison: Previous exams.  Technique: Multiplanar, multisequence MR images of the right breast were obtained prior to and following the intravenous administration of 9 ml of Mulithance.  I met with the patient, and we discussed the procedure of MRI guided biopsy, including risks, benefits, and alternatives. Specifically, we discussed the risks of infection,  bleeding, tissue injury, clip migration, and inadequate sampling.  Informed, written consent was given.  Using sterile technique, 2% Lidocaine, MRI guidance, and a 9 gauge vacuum assisted device, biopsy was attempted using a mediolateral approach.  However, the procedure  was unsuccessful due to the extremely thin breast compression.  IMPRESSION:  Unsuccessful MRI guided vacuum-assisted biopsy of the right breast.  Patient was sent to the [facility] for further evaluation with ultrasound.   Original Report Authenticated By: Cain Saupe, M.D.   Mr Breast Bilateral W Wo Contrast  06/26/2012   *RADIOLOGY REPORT*  Clinical Data: History of new diagnosis of breast cancer from recent ultrasound guided core needle biopsy right breast 06/20/2012 demonstrating invasive ductal carcinoma with DCIS.  GFR 89, creatinine 0.7 on 06/06/2012.  BILATERAL BREAST MRI WITH AND WITHOUT CONTRAST  Technique: Multiplanar, multisequence MR images of both breasts were obtained prior to and following the intravenous administration of 10ml of multihance.  Three dimensional images were evaluated at the independent DynaCad workstation.  Comparison:  Recent mammograms and ultrasound.  Findings: Examination demonstrates mild background parenchymal enhancement.  Right breast:  There is evidence of an ovoid gently lobulated and somewhat ill-defined heterogeneously enhancing mass over the deep third of the inner midportion of the right breast representing patient's biopsy-proven malignancy.  This mass contains central clip artifact and measures approximately 0.9 x 1.9 x 1.0 cm. The anterior border of this mass is located 5.5 cm from the nipple. There is a 6 mm slightly ill-defined enhancing ovoid mass 6 mm posterior lateral to the biopsy-proven malignancy lying just superficial to the pectoralis muscle.  There is also clumped non mass enhancement anterior and slightly superior to the biopsy- proven malignancy in the middle third at  approximately the 2 o'clock position.  This measures approximately 0.8 x 1.6 x 1.2 cm and is located 3 cm from the nipple.  This is suspicious for multifocal disease.  No suspicious axillary or internal mammary lymph nodes.  Left breast:  No suspicious mass, enhancement or adenopathy.  IMPRESSION: Known biopsy-proven malignancy over the deep third of the inner mid right breast.  6 mm ill-defined enhancing ovoid mass 6 mm posterior lateral to the biopsy-proven malignancy and lying just superficial to the pectoralis muscle.  Clumped non mass enhancement anterior and superior to patient's known malignancy at approximately the 2 o'clock position measuring 0.8 x 1.6 x 2.2 cm.  Findings likely represent multi focal disease.  RECOMMENDATION: Recommend second look ultrasound and core biopsy if feasible of the clumped non mass enhancement at approximately the 2 o'clock position right breast anterior/superior to the known malignancy. If not seen by ultrasound, recommend proceeding to MR guided biopsy of this suspicious abnormality.  THREE-DIMENSIONAL MR IMAGE RENDERING ON INDEPENDENT WORKSTATION:  Three-dimensional MR images were rendered by post-processing of the original MR data on an independent workstation.  The three- dimensional MR images were interpreted, and findings were reported in the accompanying complete MRI report for this study.  BI-RADS CATEGORY 0:  Incomplete.  Need additional imaging evaluation and/or prior mammograms for comparison.   Original Report Authenticated By: Elberta Fortis, M.D.   Dg Chest Port 1 View  07/06/2012   *RADIOLOGY REPORT*  Clinical Data: Post left-sided port catheter insertion  PORTABLE CHEST - 1 VIEW  Comparison: 07/04/2012  Findings:  Grossly unchanged cardiac silhouette and mediastinal contours. Interval placement of a left anterior chest wall subclavian vein approach port-a-catheter with tip projecting over the superior cavoatrial junction. Grossly unchanged symmetric biapical  pleural parenchymal thickening.  No pneumothorax.  No focal airspace opacities.  No pleural effusion.  No evidence of edema.  Unchanged bones.  IMPRESSION: Interval placement of a left anterior chest wall subclavian vein approach port-a-catheter with tip overlying the superior cavoatrial  junction.  No pneumothorax.   Original Report Authenticated By: Tacey Ruiz, MD   Mm Digital Diagnostic Bilat  06/20/2012   *RADIOLOGY REPORT*  Clinical Data:  52 year old female for annual bilateral mammograms and palpable mass in the upper and inner right breast discovered on clinical examination.  DIGITAL DIAGNOSTIC BILATERAL MAMMOGRAM WITH CAD AND RIGHT BREAST ULTRASOUND:  Comparison:  03/21/2009 and prior mammograms dating back to 01/21/2005  Findings:  ACR Breast Density Category 4: The breast tissue is extremely dense.  Routine and spot compression views of the right breast demonstrate no evidence of suspicious mass or distortion. Scattered punctate calcifications within the breasts bilaterally are again identified.  Mammographic images were processed with CAD.  On physical exam, thickening is identified at the 12 o'clock position of the right breast 4 cm from the nipple. A firm palpable mass identified at the 3 o'clock position of the right breast 4 cm from the nipple.  Ultrasound is performed, showing no evidence of solid or cystic mass, distortion or abnormal areas of shadowing at the 12 o'clock position of the right breast. A 7 x 6 x 17 mm slightly irregular oval hypoechoic mass with calcifications is horizontally oriented at the 3 o'clock position of the right breast 4 cm from the nipple.  IMPRESSION: Indeterminate 7 x 6 x 17 mm hypoechoic mass at the 3 o'clock position of the right breast.  Tissue sampling is recommended.  No mammographic evidence of breast malignancy bilaterally.  No mammographic, palpable or sonographic abnormality at the 12 o'clock position of the right breast, in the area of palpable concern.   BI-RADS CATEGORY 4:  Suspicious abnormality - biopsy should be considered.  RECOMMENDATION:  Ultrasound guided right breast biopsy, which will be performed today but dictated in a separate report.  I have discussed the findings and recommendations with the patient. Results were also provided in writing at the conclusion of the visit.  If applicable, a reminder letter will be sent to the patient regarding the next appointment.   Original Report Authenticated By: Harmon Pier, M.D.   Mm Digital Diagnostic Unilat R  07/12/2012   *RADIOLOGY REPORT*  Clinical Data:  Ultrasound-guided core needle biopsy of a hypoechoic area at 2 o'clock 2 cm from the right nipple with clip placement.  Known invasive mammary carcinoma in the right breast. Unsuccessful right breast MRI guided biopsy earlier today.  DIGITAL DIAGNOSTIC RIGHT MAMMOGRAM  Comparison:  Previous exams.  Findings:  Films are performed following ultrasound guided biopsy of a small hypoechoic area at 2 o'clock 2 cm from the right nipple. The ribbon clip is positioned within the area of concern. The ribbon clip is approximately 2.6 cm anterior to the top hat clip from the original biopsy on 06/20/2012.  IMPRESSION: Appropriate clip placement following ultrasound-guided core needle biopsy of a hypoechoic area at 2 o'clock 2 cm in the right nipple.   Original Report Authenticated By: Cain Saupe, M.D.   Mm Digital Diagnostic Unilat R  06/20/2012   *RADIOLOGY REPORT*  Clinical Data:  52 year old female - evaluate clip placement following ultrasound guided right breast biopsy.  DIGITAL DIAGNOSTIC RIGHT MAMMOGRAM  Comparison:  Previous exams.  Findings:  Films are performed following ultrasound guided biopsy of the 7 x 6 x 17 mm hypoechoic mass at the 3 o'clock position of the right breast 4 cm from the nipple. The T shaped biopsy clip is in satisfactory position.  IMPRESSION: Satisfactory clip placement following ultrasound guided right breast biopsy.  Pathology  will  be followed.   Original Report Authenticated By: Harmon Pier, M.D.   Dg Fluoro Guide Cv Line-no Report  07/06/2012   CLINICAL DATA: port placement   FLOURO GUIDE CV LINE  Fluoroscopy was utilized by the requesting physician.  No radiographic  interpretation.    [place] Rt Breast Bx W Loc Dev 1st Lesion Img Bx Spec US Guide  07/13/2012   **ADDENDUM** CREATED: 07/13/2012 14:11:45  I spoke with the patient by telephone on 07/13/2012 to discuss pathology results.  Pathology demonstrates  pseudoangiomatous stromal hyperplasia.  The findings were called to the office of Dr. Donell Beers. Although the imaging findings could be related to pseudoangiomatous stromal hyperplasia, excision of the anterior biopsy site is suggested at the time of patient's definitive surgery.  The patient is considering unilateral or bilateral mastectomy at this time.  The patient reports no problems at the biopsy site.  All questions were answered.  Recommendations:  Excision of the biopsy site. The patient is scheduled to begin chemotherapy in 4 days.  **END ADDENDUM** SIGNED BY: Blair Hailey. Manson Passey, M.D.  07/12/2012   *RADIOLOGY REPORT*  Clinical Data:  Unsuccessful MRI guided biopsy of the area of abnormal enhancement in the left anterior upper inner quadrant, anterior to the known invasive mammary carcinoma.  Second look ultrasound performed and a 5 x 4 x 4 mm hypoechoic area found at 2 o'clock 2 cm from the right nipple which will be biopsied.  ULTRASOUND GUIDED VACUUM ASSISTED CORE BIOPSY OF THE RIGHT BREAST  Sonography of the right upper inner quadrant was performed.  There is an ill-defined hypoechoic 5 x 4 x 4 mm area at 2 o'clock 2 cm from the right nipple which will be biopsied with ultrasound guidance.  The patient and I discussed the procedure of ultrasound-guided biopsy, including benefits and alternatives.  We discussed the high likelihood of a successful procedure. We discussed the risks of the procedure including infection,  bleeding, tissue injury, clip migration, and inadequate sampling.  Written informed consent was given.  Using sterile technique, 2% lidocaine, ultrasound guidance, and a 12 gauge vacuum assisted needle, biopsy was performed of the hypoechoic area at 2 o'clock 2 cm from the right nipple using a caudocranial approach.  At the conclusion of the procedure, a within tissue marker clip was deployed into the biopsy cavity. Follow-up 2-view mammogram was performed and dictated separately.  IMPRESSION: Ultrasound-guided biopsy of a 5 x 4 x 4 mm hypoechoic area at 2 o'clock 2 cm from the right nipple.  No apparent complications.   Original Report Authenticated By: Cain Saupe, M.D.   [place] Rt Breast Bx W Loc Dev 1st Lesion Img Bx Spec US Guide  06/21/2012   *RADIOLOGY REPORT*  Clinical Data:  Indeterminate 7 x 6 x 17 mm hypoechoic mass in the inner right breast - for tissue sampling.  ULTRASOUND GUIDED VACUUM ASSISTED CORE BIOPSY OF THE RIGHT BREAST  Comparison: Previous exams.  I met with the patient and we discussed the procedure of ultrasound- guided biopsy, including benefits and alternatives.  We discussed the high likelihood of a successful procedure. We discussed the risks of the procedure including infection, bleeding, tissue injury, clip migration, and inadequate sampling.  Informed written consent was given.  Using sterile technique, 2% lidocaine ultrasound guidance and a 12 gauge vacuum assisted needle biopsy was performed of the 7 x 6 x 17 mm hypoechoic mass at the 3 o'clock position of the right breast 4 cm from the nipple using a lateral approach.  At the conclusion of the procedure, a T shaped tissue marker clip was deployed into the biopsy cavity.  Follow-up 2-view mammogram was performed and dictated separately.  IMPRESSION: Ultrasound-guided biopsy of right breast mass.  No apparent complications.  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 Histology correlates with imaging  findings.  The patient was contacted by phone on 06/21/2012 and these results given to her which she understood. Her questions were answered. She was encouraged to obtain breast cancer educational material from [facility] at her convenience. The patient had no complaints with her biopsy site.  Recommend surgery/oncology consultation.  An appointment at the [facility] has been scheduled for 06/28/2012. Recommend bilateral breast MRI, which has been scheduled for 06/26/2012. The patient was informed of these appointments.   Original Report Authenticated By: Harmon Pier, M.D.    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 Patient had other areas seen on MRI biopsy of the second mass was negative. Patient has had her Port-A-Cath placed. She is proceeding neoadjuvant chemotherapy to possibly conserve her breast. Chemotherapy consisted of Adriamycin Cytoxan given dose dense for 4 cycles starting on 07/17/2012 with Neulasta support.  This was followed by Taxol/Carbo which started on 09/11/12.  She received 7 cycles of this and it was discontinued early due to neuropathy.  She will now receive Gemzar/carbo beginning 11/06/12.    #2 we discussed risks benefits and side effects of chemotherapy. She has had an echocardiogram as well as chemotherapy teaching class. Port-A-Cath was placed by Dr. Donell Beers. The port is functioning well.  #3 Gemzar/Carbo starting 11/06/12  She will receive 3 cycles.    #4 vaginal herpes outbreak  #5 neuropathy  PLAN:  #1  Ms. Perno is doing well. I reviewed her labs with her in detail.  They are stable.  She will proceed with chemotherapy.    #2 She will continue Gabapentin 300mg  in the morning and afternoon and 400mg  at night as her neuropathy is improving.  She will pick up her prescription for this today.    #3  She will undergo MRI breasts on 10/31 and have f/u with Dr.  Donell Beers on 11/7.    All questions were answered. The patient knows to call the clinic with any problems, questions or concerns. We can certainly see the patient much sooner if necessary.  I spent 25 minutes counseling the patient face to face. The total time spent in the appointment was 30 minutes.  Illa Level, NP Medical Oncology [facility] [phone]  11/21/2012, 8:56 AM

## 2012-11-20 NOTE — Patient Instructions (Signed)
[facility] Discharge Instructions for Patients Receiving Chemotherapy  Today you received the following chemotherapy Gemzar and Carboplatin.  To help prevent nausea and vomiting after your treatment, we encourage you to take your nausea medication.   If you develop nausea and vomiting that is not controlled by your nausea medication, call the clinic.   BELOW ARE SYMPTOMS THAT SHOULD BE REPORTED IMMEDIATELY:  *FEVER GREATER THAN 100.5 F  *CHILLS WITH OR WITHOUT FEVER  NAUSEA AND VOMITING THAT IS NOT CONTROLLED WITH YOUR NAUSEA MEDICATION  *UNUSUAL SHORTNESS OF BREATH  *UNUSUAL BRUISING OR BLEEDING  TENDERNESS IN MOUTH AND THROAT WITH OR WITHOUT PRESENCE OF ULCERS  *URINARY PROBLEMS  *BOWEL PROBLEMS  UNUSUAL RASH Items with * indicate a potential emergency and should be followed up as soon as possible.  Feel free to call the clinic you have any questions or concerns. The clinic phone number is [phone].

## 2012-11-21 ENCOUNTER — Ambulatory Visit (HOSPITAL_BASED_OUTPATIENT_CLINIC_OR_DEPARTMENT_OTHER): Payer: Medicaid Other

## 2012-11-21 ENCOUNTER — Other Ambulatory Visit: Payer: Self-pay | Admitting: Certified Registered Nurse Anesthetist

## 2012-11-21 VITALS — BP 111/86 | HR 92 | Temp 98.0°F

## 2012-11-21 DIAGNOSIS — C50311 Malignant neoplasm of lower-inner quadrant of right female breast: Secondary | ICD-10-CM

## 2012-11-21 DIAGNOSIS — Z5189 Encounter for other specified aftercare: Secondary | ICD-10-CM

## 2012-11-21 DIAGNOSIS — C50319 Malignant neoplasm of lower-inner quadrant of unspecified female breast: Secondary | ICD-10-CM

## 2012-11-21 MED ORDER — PEGFILGRASTIM INJECTION 6 MG/0.6ML
6.0000 mg | Freq: Once | SUBCUTANEOUS | Status: AC
  Administered 2012-11-21: 6 mg via SUBCUTANEOUS
  Filled 2012-11-21: qty 0.6

## 2012-11-24 ENCOUNTER — Ambulatory Visit (HOSPITAL_COMMUNITY)
Admission: RE | Admit: 2012-11-24 | Discharge: 2012-11-24 | Disposition: A | Discharge status: Home / Self Care | Payer: Medicaid Other | Source: Ambulatory Visit | Attending: Adult Health | Admitting: Adult Health

## 2012-11-24 DIAGNOSIS — C50311 Malignant neoplasm of lower-inner quadrant of right female breast: Secondary | ICD-10-CM

## 2012-11-24 DIAGNOSIS — Z9221 Personal history of antineoplastic chemotherapy: Secondary | ICD-10-CM | POA: Insufficient documentation

## 2012-11-24 DIAGNOSIS — C50919 Malignant neoplasm of unspecified site of unspecified female breast: Secondary | ICD-10-CM | POA: Insufficient documentation

## 2012-11-24 DIAGNOSIS — K769 Liver disease, unspecified: Secondary | ICD-10-CM | POA: Insufficient documentation

## 2012-11-24 MED ORDER — GADOBENATE DIMEGLUMINE 529 MG/ML IV SOLN
15.0000 mL | Freq: Once | INTRAVENOUS | Status: AC | PRN
  Administered 2012-11-24: 12 mL via INTRAVENOUS

## 2012-11-27 ENCOUNTER — Ambulatory Visit (HOSPITAL_BASED_OUTPATIENT_CLINIC_OR_DEPARTMENT_OTHER): Payer: Medicaid Other | Admitting: Adult Health

## 2012-11-27 ENCOUNTER — Other Ambulatory Visit (HOSPITAL_BASED_OUTPATIENT_CLINIC_OR_DEPARTMENT_OTHER): Payer: Medicaid Other | Admitting: Lab

## 2012-11-27 ENCOUNTER — Encounter: Payer: Self-pay | Admitting: Adult Health

## 2012-11-27 VITALS — BP 135/83 | HR 98 | Temp 98.5°F | Resp 18 | Ht 63.0 in | Wt 129.1 lb

## 2012-11-27 DIAGNOSIS — C50311 Malignant neoplasm of lower-inner quadrant of right female breast: Secondary | ICD-10-CM

## 2012-11-27 DIAGNOSIS — C50919 Malignant neoplasm of unspecified site of unspecified female breast: Secondary | ICD-10-CM

## 2012-11-27 DIAGNOSIS — C50319 Malignant neoplasm of lower-inner quadrant of unspecified female breast: Secondary | ICD-10-CM

## 2012-11-27 DIAGNOSIS — Z171 Estrogen receptor negative status [ER-]: Secondary | ICD-10-CM

## 2012-11-27 DIAGNOSIS — G609 Hereditary and idiopathic neuropathy, unspecified: Secondary | ICD-10-CM

## 2012-11-27 LAB — COMPREHENSIVE METABOLIC PANEL (CC13)
ALT: 19 U/L (ref 0–55)
AST: 15 U/L (ref 5–34)
Albumin: 3.3 g/dL — ABNORMAL LOW (ref 3.5–5.0)
Alkaline Phosphatase: 137 U/L (ref 40–150)
Anion Gap: 11 mEq/L (ref 3–11)
BUN: 22.4 mg/dL (ref 7.0–26.0)
CO2: 30 mEq/L — ABNORMAL HIGH (ref 22–29)
Calcium: 9.5 mg/dL (ref 8.4–10.4)
Chloride: 101 mEq/L (ref 98–109)
Creatinine: 0.7 mg/dL (ref 0.6–1.1)
Glucose: 86 mg/dl (ref 70–140)
Potassium: 3.6 mEq/L (ref 3.5–5.1)
Sodium: 142 mEq/L (ref 136–145)
Total Bilirubin: 0.21 mg/dL (ref 0.20–1.20)
Total Protein: 6.7 g/dL (ref 6.4–8.3)

## 2012-11-27 LAB — CBC WITH DIFFERENTIAL/PLATELET
BASO%: 0.2 % (ref 0.0–2.0)
Basophils Absolute: 0 10*3/uL (ref 0.0–0.1)
EOS%: 0.1 % (ref 0.0–7.0)
Eosinophils Absolute: 0 10*3/uL (ref 0.0–0.5)
HCT: 31 % — ABNORMAL LOW (ref 34.8–46.6)
HGB: 10.6 g/dL — ABNORMAL LOW (ref 11.6–15.9)
LYMPH%: 8.5 % — ABNORMAL LOW (ref 14.0–49.7)
MCH: 32.6 pg (ref 25.1–34.0)
MCHC: 34.2 g/dL (ref 31.5–36.0)
MCV: 95.4 fL (ref 79.5–101.0)
MONO#: 0.7 10*3/uL (ref 0.1–0.9)
MONO%: 4.3 % (ref 0.0–14.0)
NEUT#: 13.3 10*3/uL — ABNORMAL HIGH (ref 1.5–6.5)
NEUT%: 86.9 % — ABNORMAL HIGH (ref 38.4–76.8)
Platelets: 107 10*3/uL — ABNORMAL LOW (ref 145–400)
RBC: 3.25 10*6/uL — ABNORMAL LOW (ref 3.70–5.45)
RDW: 15.9 % — ABNORMAL HIGH (ref 11.2–14.5)
WBC: 15.3 10*3/uL — ABNORMAL HIGH (ref 3.9–10.3)
lymph#: 1.3 10*3/uL (ref 0.9–3.3)
nRBC: 0 % (ref 0–0)

## 2012-11-27 NOTE — Progress Notes (Addendum)
OFFICE PROGRESS NOTE  CC  [name], MD [address] [place] [address] Dr. Everardo Beals Dr. 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 Patient had a palpable mass at the 3:00 position. She was seen at the breast Center on 06/20/2012 she had a mammogram and ultrasound showing a 1.7 cm mass at the 3:00 position. 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 Tumor was grade 3.  #2 MRIs performed on 06/26/2012 showed no biopsy mass at 3:00 but also 2 other adjacent masses at 1 at 2:00 measuring 0.8 x 1.6 x 2.2 cm andalso 6 mm ill-defined mass posterior to the biopsy-proven malignancy.  #3 patient has underwent Port-A-Cath placement in order to begin neoadjuvant chemotherapy initially consisting of Adriamycin Cytoxan to be given dose dense for 4 cycles.  She underwent this from 07/17/12 through 09/04/12 and it was followed by weekly Taxol and carboplatinum for a total of 12 weeks starting on 09/11/12.  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  She will receive Gemzar Carbo starting 11/06/12.  The plan is for her to receive 3 cycles.    CURRENT THERAPY: Gemzar Carbo cycle 2 day 8  INTERVAL HISTORY: Jaime Murillo 52 y.o. female returns for evaluation following her second cycle of Gemzar carbo.  She is doing well today.  She denies fevers, chills, nausea, vomiting, constipation, diarrhea.  She continues to have numbness in her fingertips and toes that remains stable on Gabapentin 300 mg in the morning and afternoon and 400mg  in the evening. She had a breast MRI on 10/31 that demonstrated an excellent response to  neoadjuvant treatment.     MEDICAL HISTORY: Past Medical History  Diagnosis Date  . HSV infection   . NSVD (normal spontaneous vaginal delivery)   . Hypertension     takes HCTZ  . Urinary frequency   . Anemia     takes Ferrous Sulfate occasionally  . Breast cancer 2014    triple negative    ALLERGIES:  has No Known Allergies.  MEDICATIONS:  Current Outpatient Prescriptions  Medication Sig Dispense Refill  . Alum & Mag Hydroxide-Simeth (MAGIC MOUTHWASH W/LIDOCAINE) SOLN Take 5 mL by mouth 4 (four) times daily. Swish and spit  500 mL  0  . dexamethasone (DECADRON) 4 MG tablet Take 1 tablet (4 mg total) by mouth 2 (two) times daily with a meal. Take 2 tabs every day on the day after chemo an then take 2 tabs twice a day with food for 2 days.  60 tablet  0  . dexamethasone (DECADRON) 4 MG tablet Take 2 tablets (8 mg total) by mouth 2 (two) times daily with a meal. Take daily starting the day after chemotherapy for 2 days. Take with food.  30 tablet  1  . ferrous sulfate 325 (65 FE) MG tablet Take 325 mg by mouth daily with breakfast.      . gabapentin (NEURONTIN) 100 MG capsule Take 3 tablets (300mg s total) twice a day and take 4 tablets (400mg s total) at bedtime.  300 capsule  2  . hydrochlorothiazide (HYDRODIURIL) 25 MG tablet Take 1 tablet (25 mg total) by mouth daily.  30 tablet  11  .  lidocaine-prilocaine (EMLA) cream Apply 1 application topically as needed (apply to PAC site 1-2 hours prior to treatment.).      Marland Kitchen LORazepam (ATIVAN) 0.5 MG tablet Take 1 tablet (0.5 mg total) by mouth every 6 (six) hours as needed (Nausea or vomiting).  30 tablet  0  . ondansetron (ZOFRAN) 8 MG tablet Take 1 tablet (8 mg total) by mouth every 8 (eight) hours as needed for nausea (Take 1 pill by mouth twice a day as needed  for nausea and vomitting starting 3rd day after chemo).  20 tablet  3  . ondansetron (ZOFRAN) 8 MG tablet Take 1 tablet (8 mg total) by mouth 2 (two) times daily. Take two times a day  starting the day after chemo for 2 days. Then take two times a day as needed for nausea or vomiting.  30 tablet  1  . oxyCODONE-acetaminophen (ROXICET) 5-325 MG per tablet Take 1-2 tablets by mouth every 4 (four) hours as needed for pain.  30 tablet  0  . prochlorperazine (COMPAZINE) 10 MG tablet Take 1 tablet (10 mg total) by mouth every 6 (six) hours as needed (Nausea or vomiting).  30 tablet  1  . prochlorperazine (COMPAZINE) 25 MG suppository Place 25 mg rectally every 12 (twelve) hours as needed for nausea (as needed for nausea and vomitting).      Marland Kitchen UNABLE TO FIND Apply 1 application topically once. 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  1 application  0  . valACYclovir (VALTREX) 500 MG tablet Take 1 tablet (500 mg total) by mouth daily as needed. For herpes  30 tablet  2   No current facility-administered medications for this visit.    SURGICAL HISTORY:  Past Surgical History  Procedure Laterality Date  . Tubal ligation  2003    LTL  . Portacath placement N/A 07/06/2012    Procedure: INSERTION PORT-A-CATH;  Surgeon: Almond Lint, MD;  Location: [facility] OR;  Service: General;  Laterality: N/A;    REVIEW OF SYSTEMS: A 10 point review of systems was conducted and is otherwise negative except for what is noted above.     PHYSICAL EXAMINATION: Blood pressure 135/83, pulse 98, temperature 98.5 F (36.9 C), temperature source Oral, resp. rate 18, height 5\' 3"  (1.6 m), weight 129 lb 1.6 oz (58.559 kg), last menstrual period 06/07/2012. Body mass index is 22.87 kg/(m^2). General: Patient is a well appearing female in no acute distress HEENT: PERRLA, sclerae anicteric no conjunctival pallor, MMM Neck: supple, no palpable adenopathy Lungs: clear to auscultation bilaterally, no wheezes, rhonchi, or rales Cardiovascular: regular rate rhythm, S1, S2, no murmurs, rubs or gallops Abdomen: Soft, non-tender, non-distended, normoactive bowel sounds,  no HSM Extremities: warm and well perfused, no clubbing, cyanosis, or edema Skin: No rashes or lesions Neuro: Non-focal Breast examination: Left breast no masses or nipple discharge Right breast without palpable abnormality ECOG PERFORMANCE STATUS: 0 - Asymptomatic  LABORATORY DATA: Lab Results  Component Value Date   WBC 15.3* 11/27/2012   HGB 10.6* 11/27/2012   HCT 31.0* 11/27/2012   MCV 95.4 11/27/2012   PLT 107* 11/27/2012      Chemistry      Component Value Date/Time   NA 142 11/27/2012 1442   NA 137 06/06/2012 0853   K 3.6 11/27/2012 1442   K 3.8 06/06/2012 0853   CL 104 07/17/2012 1357   CL 102 06/06/2012 0853   CO2 30* 11/27/2012 1442   CO2 26 06/06/2012  0853   BUN 22.4 11/27/2012 1442   BUN 12 06/06/2012 0853   CREATININE 0.7 11/27/2012 1442   CREATININE 0.70 06/06/2012 0853   CREATININE 0.85 04/24/2009 1114      Component Value Date/Time   CALCIUM 9.5 11/27/2012 1442   CALCIUM 9.3 06/06/2012 0853   ALKPHOS 137 11/27/2012 1442   ALKPHOS 35* 06/06/2012 0853   AST 15 11/27/2012 1442   AST 14 06/06/2012 0853   ALT 19 11/27/2012 1442   ALT 9 06/06/2012 0853   BILITOT 0.21 11/27/2012 1442   BILITOT 0.8 06/06/2012 0853     ADDITIONAL INFORMATION: PROGNOSTIC INDICATORS - ACIS Results: IMMUNOHISTOCHEMICAL AND MORPHOMETRIC ANALYSIS BY THE AUTOMATED CELLULAR IMAGING SYSTEM (ACIS) Estrogen Receptor: 0%, NEGATIVE Progesterone Receptor: 0%, NEGATIVE Proliferation Marker Ki67: 95% COMMENT: The negative hormone receptor study(ies) in this case have an internal positive control. REFERENCE RANGE ESTROGEN RECEPTOR NEGATIVE <1% POSITIVE =>1% PROGESTERONE RECEPTOR NEGATIVE <1% POSITIVE =>1% All controls stained appropriately Pecola Leisure MD Pathologist, Electronic Signature ( Signed 06/28/2012) CHROMOGENIC IN-SITU HYBRIDIZATION Results: HER-2/NEU BY CISH - NO AMPLIFICATION OF HER-2 DETECTED. RESULT RATIO OF HER2: CEP 17 SIGNALS 1.26 1 of 3 Duplicate copy FINAL for RAYETTA, VEITH  [phone]) ADDITIONAL INFORMATION:(continued) AVERAGE HER2 COPY NUMBER PER CELL 2.45 REFERENCE RANGE NEGATIVE HER2/Chr17 Ratio <2.0 and Average HER2 copy number <4.0 EQUIVOCAL HER2/Chr17 Ratio <2.0 and Average HER2 copy number 4.0 and <6.0 POSITIVE HER2/Chr17 Ratio >=2.0 and/or Average HER2 copy number >=6.0 Jimmy Picket MD Pathologist, Electronic Signature ( Signed 06/26/2012) FINAL DIAGNOSIS Diagnosis Breast, right, needle core biopsy, 3:00 position - INVASIVE DUCTAL CARCINOMA SEE COMMENT. - DUCTAL CARCINOMA IN SITU Microscopic Comment Although the grade of tumor is best assessed at resection, with these biopsies, both the invasive and in situ carcinoma are grade III. Breast prognostic studies are pending and will be reported in an addendum. The case is reviewed with Dr. Raynald Blend who concurs. (CRR:gt, 06/21/12) [place] RUND DO Pathologist, Electronic FINAL DIAGNOSIS Diagnosis Breast, right, needle core biopsy, mass - BENIGN BREAST TISSUE WITH FOCAL PSEUDOANGIOMATOUS STROMAL HYPERPLASIA (PASH). NO EVIDENCE OF MALIGNANCY. Microscopic Comment Called to the [facility] on 07/13/12. (JDP:caf 07/13/12)  RADIOGRAPHIC STUDIES:  Dg Chest 2 View  07/04/2012   *RADIOLOGY REPORT*  Clinical Data: Insertion of Port-A-Cath.  Breast cancer. Hypertension.  CHEST - 2 VIEW  Comparison: CT of the chest on 07/03/2012  Findings: Cardiomediastinal silhouette is within normal limits. The lungs are free of focal consolidations and pleural effusions. Bony structures have a normal appearance.  Contrast is identified within the colon following CT of the abdomen.  IMPRESSION: Negative exam.   Original Report Authenticated By: Norva Pavlov, M.D.   Ct Chest W Contrast  07/03/2012   *RADIOLOGY REPORT*  Clinical Data:  Breast cancer.  No therapy yet.  Lower inner quadrant of right breast.  CT CHEST, ABDOMEN AND PELVIS WITH CONTRAST  Technique: Contiguous axial images of the chest abdomen and pelvis  were obtained after IV contrast administration.  Contrast: 80 ml Omnipaque-300  Comparison: Breast MR of 06/26/2012.  No prior CTs.  CT CHEST  Findings: Lung windows demonstrate minimal left lower lobe subpleural nodularity at 3 mm on image 40/series 4.  Right lung clear.  Soft tissue windows demonstrate small bilateral axillary nodes, without adenopathy.  No subpectoral adenopathy. No supraclavicular adenopathy.  Normal heart size, without pericardial effusion.  No central pulmonary embolism, on this non-dedicated study.  No mediastinal or hilar adenopathy.  No internal mammary adenopathy. Minimal residual thymic tissue in  the anterior mediastinum (image 17/series 2).  IMPRESSION:  1. No acute process or evidence of metastatic disease in the chest. 2.  Probable subpleural lymph node in the left lower lobe. Recommend attention on follow-up.  CT ABDOMEN AND PELVIS  Findings:  Mild early phase of hepatic enhancement which decreases sensitivity for focal liver lesions.  None identified.  Normal spleen, stomach, pancreas, gallbladder, biliary tract, adrenal glands, kidneys. No retroperitoneal or retrocrural adenopathy.  Normal colon, appendix, and terminal ileum.  Normal small bowel without abdominal ascites.  No pelvic adenopathy.    Normal urinary bladder and uterus.  No adnexal mass.  Trace free pelvic fluid is likely physiologic.  Transitional right-sided lumbosacral anatomy.  Degenerative disc disease at the lumbosacral junction.  IMPRESSION:  No acute process or evidence of metastatic disease in the abdomen or pelvis.   Original Report Authenticated By: Jeronimo Greaves, M.D.   US Breast Right  06/29/2012   *RADIOLOGY REPORT*  Clinical Data:  52 year old female with newly diagnosed right breast cancer.  Suspicious abnormal enhancement anterior and superior to the biopsy-proven neoplasm - for second look ultrasound evaluation.  RIGHT BREAST ULTRASOUND  Comparison:  Prior mammograms, ultrasounds and MRI.  Findings:  Ultrasound is performed, showing the biopsy-proven neoplasm at the 3 o'clock position of the right breast 4 cm from the nipple. No other discrete sonographic abnormalities are identified, specifically medial, superior, and anterior to the biopsy-proven neoplasm.  IMPRESSION: Patchy abnormal enhancement medial superior to the biopsy-proven neoplasm identified on recent MRI is not visualized sonographically as a discrete abnormality.  MR guided biopsy is recommended if breast conservation surgery is considered.  BI-RADS CATEGORY 6:  Known biopsy-proven malignancy - appropriate action should be taken.  RECOMMENDATION: MR guided right breast biopsy, which will be scheduled by our office.  I have discussed the findings and recommendations with the patient. Results were also provided in writing at the conclusion of the visit.  If applicable, a reminder letter will be sent to the patient regarding the next appointment.   Original Report Authenticated By: Harmon Pier, M.D.   US Breast Right  06/20/2012   *RADIOLOGY REPORT*  Clinical Data:  52 year old female for annual bilateral mammograms and palpable mass in the upper and inner right breast discovered on clinical examination.  DIGITAL DIAGNOSTIC BILATERAL MAMMOGRAM WITH CAD AND RIGHT BREAST ULTRASOUND:  Comparison:  03/21/2009 and prior mammograms dating back to 01/21/2005  Findings:  ACR Breast Density Category 4: The breast tissue is extremely dense.  Routine and spot compression views of the right breast demonstrate no evidence of suspicious mass or distortion. Scattered punctate calcifications within the breasts bilaterally are again identified.  Mammographic images were processed with CAD.  On physical exam, thickening is identified at the 12 o'clock position of the right breast 4 cm from the nipple. A firm palpable mass identified at the 3 o'clock position of the right breast 4 cm from the nipple.  Ultrasound is performed, showing no evidence of solid or cystic  mass, distortion or abnormal areas of shadowing at the 12 o'clock position of the right breast. A 7 x 6 x 17 mm slightly irregular oval hypoechoic mass with calcifications is horizontally oriented at the 3 o'clock position of the right breast 4 cm from the nipple.  IMPRESSION: Indeterminate 7 x 6 x 17 mm hypoechoic mass at the 3 o'clock position of the right breast.  Tissue sampling is recommended.  No mammographic evidence of breast malignancy bilaterally.  No mammographic, palpable or sonographic abnormality  at the 12 o'clock position of the right breast, in the area of palpable concern.  BI-RADS CATEGORY 4:  Suspicious abnormality - biopsy should be considered.  RECOMMENDATION:  Ultrasound guided right breast biopsy, which will be performed today but dictated in a separate report.  I have discussed the findings and recommendations with the patient. Results were also provided in writing at the conclusion of the visit.  If applicable, a reminder letter will be sent to the patient regarding the next appointment.   Original Report Authenticated By: Harmon Pier, M.D.   Ct Abdomen Pelvis W Contrast  07/03/2012   *RADIOLOGY REPORT*  Clinical Data:  Breast cancer.  No therapy yet.  Lower inner quadrant of right breast.  CT CHEST, ABDOMEN AND PELVIS WITH CONTRAST  Technique: Contiguous axial images of the chest abdomen and pelvis were obtained after IV contrast administration.  Contrast: 80 ml Omnipaque-300  Comparison: Breast MR of 06/26/2012.  No prior CTs.  CT CHEST  Findings: Lung windows demonstrate minimal left lower lobe subpleural nodularity at 3 mm on image 40/series 4.  Right lung clear.  Soft tissue windows demonstrate small bilateral axillary nodes, without adenopathy.  No subpectoral adenopathy. No supraclavicular adenopathy.  Normal heart size, without pericardial effusion.  No central pulmonary embolism, on this non-dedicated study.  No mediastinal or hilar adenopathy.  No internal mammary adenopathy.  Minimal residual thymic tissue in the anterior mediastinum (image 17/series 2).  IMPRESSION:  1. No acute process or evidence of metastatic disease in the chest. 2.  Probable subpleural lymph node in the left lower lobe. Recommend attention on follow-up.  CT ABDOMEN AND PELVIS  Findings:  Mild early phase of hepatic enhancement which decreases sensitivity for focal liver lesions.  None identified.  Normal spleen, stomach, pancreas, gallbladder, biliary tract, adrenal glands, kidneys. No retroperitoneal or retrocrural adenopathy.  Normal colon, appendix, and terminal ileum.  Normal small bowel without abdominal ascites.  No pelvic adenopathy.    Normal urinary bladder and uterus.  No adnexal mass.  Trace free pelvic fluid is likely physiologic.  Transitional right-sided lumbosacral anatomy.  Degenerative disc disease at the lumbosacral junction.  IMPRESSION:  No acute process or evidence of metastatic disease in the abdomen or pelvis.   Original Report Authenticated By: Jeronimo Greaves, M.D.   Mr Breast Right W Wo Contrast  07/12/2012   *RADIOLOGY REPORT*  Clinical Data:  Known invasive mammary carcinoma in the right breast at 3 o'clock posteriorly.  Additional site of abnormal enhancement in the right upper inner quadrant closer to the nipple for possible biopsy.  ATTEMPTED MRI GUIDED VACUUM ASSISTED BIOPSY OF THE RIGHT BREAST WITHOUT AND WITH CONTRAST  Comparison: Previous exams.  Technique: Multiplanar, multisequence MR images of the right breast were obtained prior to and following the intravenous administration of 9 ml of Mulithance.  I met with the patient, and we discussed the procedure of MRI guided biopsy, including risks, benefits, and alternatives. Specifically, we discussed the risks of infection, bleeding, tissue injury, clip migration, and inadequate sampling.  Informed, written consent was given.  Using sterile technique, 2% Lidocaine, MRI guidance, and a 9 gauge vacuum assisted device, biopsy was  attempted using a mediolateral approach.  However, the procedure was unsuccessful due to the extremely thin breast compression.  IMPRESSION:  Unsuccessful MRI guided vacuum-assisted biopsy of the right breast.  Patient was sent to the [facility] for further evaluation with ultrasound.   Original Report Authenticated By: Cain Saupe, M.D.   Mr Breast Bilateral  W Wo Contrast  06/26/2012   *RADIOLOGY REPORT*  Clinical Data: History of new diagnosis of breast cancer from recent ultrasound guided core needle biopsy right breast 06/20/2012 demonstrating invasive ductal carcinoma with DCIS.  GFR 89, creatinine 0.7 on 06/06/2012.  BILATERAL BREAST MRI WITH AND WITHOUT CONTRAST  Technique: Multiplanar, multisequence MR images of both breasts were obtained prior to and following the intravenous administration of 10ml of multihance.  Three dimensional images were evaluated at the independent DynaCad workstation.  Comparison:  Recent mammograms and ultrasound.  Findings: Examination demonstrates mild background parenchymal enhancement.  Right breast:  There is evidence of an ovoid gently lobulated and somewhat ill-defined heterogeneously enhancing mass over the deep third of the inner midportion of the right breast representing patient's biopsy-proven malignancy.  This mass contains central clip artifact and measures approximately 0.9 x 1.9 x 1.0 cm. The anterior border of this mass is located 5.5 cm from the nipple. There is a 6 mm slightly ill-defined enhancing ovoid mass 6 mm posterior lateral to the biopsy-proven malignancy lying just superficial to the pectoralis muscle.  There is also clumped non mass enhancement anterior and slightly superior to the biopsy- proven malignancy in the middle third at approximately the 2 o'clock position.  This measures approximately 0.8 x 1.6 x 1.2 cm and is located 3 cm from the nipple.  This is suspicious for multifocal disease.  No suspicious axillary or internal mammary lymph  nodes.  Left breast:  No suspicious mass, enhancement or adenopathy.  IMPRESSION: Known biopsy-proven malignancy over the deep third of the inner mid right breast.  6 mm ill-defined enhancing ovoid mass 6 mm posterior lateral to the biopsy-proven malignancy and lying just superficial to the pectoralis muscle.  Clumped non mass enhancement anterior and superior to patient's known malignancy at approximately the 2 o'clock position measuring 0.8 x 1.6 x 2.2 cm.  Findings likely represent multi focal disease.  RECOMMENDATION: Recommend second look ultrasound and core biopsy if feasible of the clumped non mass enhancement at approximately the 2 o'clock position right breast anterior/superior to the known malignancy. If not seen by ultrasound, recommend proceeding to MR guided biopsy of this suspicious abnormality.  THREE-DIMENSIONAL MR IMAGE RENDERING ON INDEPENDENT WORKSTATION:  Three-dimensional MR images were rendered by post-processing of the original MR data on an independent workstation.  The three- dimensional MR images were interpreted, and findings were reported in the accompanying complete MRI report for this study.  BI-RADS CATEGORY 0:  Incomplete.  Need additional imaging evaluation and/or prior mammograms for comparison.   Original Report Authenticated By: Elberta Fortis, M.D.   Dg Chest Port 1 View  07/06/2012   *RADIOLOGY REPORT*  Clinical Data: Post left-sided port catheter insertion  PORTABLE CHEST - 1 VIEW  Comparison: 07/04/2012  Findings:  Grossly unchanged cardiac silhouette and mediastinal contours. Interval placement of a left anterior chest wall subclavian vein approach port-a-catheter with tip projecting over the superior cavoatrial junction. Grossly unchanged symmetric biapical pleural parenchymal thickening.  No pneumothorax.  No focal airspace opacities.  No pleural effusion.  No evidence of edema.  Unchanged bones.  IMPRESSION: Interval placement of a left anterior chest wall subclavian vein  approach port-a-catheter with tip overlying the superior cavoatrial junction.  No pneumothorax.   Original Report Authenticated By: Tacey Ruiz, MD   Mm Digital Diagnostic Bilat  06/20/2012   *RADIOLOGY REPORT*  Clinical Data:  52 year old female for annual bilateral mammograms and palpable mass in the upper and inner right breast discovered on  clinical examination.  DIGITAL DIAGNOSTIC BILATERAL MAMMOGRAM WITH CAD AND RIGHT BREAST ULTRASOUND:  Comparison:  03/21/2009 and prior mammograms dating back to 01/21/2005  Findings:  ACR Breast Density Category 4: The breast tissue is extremely dense.  Routine and spot compression views of the right breast demonstrate no evidence of suspicious mass or distortion. Scattered punctate calcifications within the breasts bilaterally are again identified.  Mammographic images were processed with CAD.  On physical exam, thickening is identified at the 12 o'clock position of the right breast 4 cm from the nipple. A firm palpable mass identified at the 3 o'clock position of the right breast 4 cm from the nipple.  Ultrasound is performed, showing no evidence of solid or cystic mass, distortion or abnormal areas of shadowing at the 12 o'clock position of the right breast. A 7 x 6 x 17 mm slightly irregular oval hypoechoic mass with calcifications is horizontally oriented at the 3 o'clock position of the right breast 4 cm from the nipple.  IMPRESSION: Indeterminate 7 x 6 x 17 mm hypoechoic mass at the 3 o'clock position of the right breast.  Tissue sampling is recommended.  No mammographic evidence of breast malignancy bilaterally.  No mammographic, palpable or sonographic abnormality at the 12 o'clock position of the right breast, in the area of palpable concern.  BI-RADS CATEGORY 4:  Suspicious abnormality - biopsy should be considered.  RECOMMENDATION:  Ultrasound guided right breast biopsy, which will be performed today but dictated in a separate report.  I have discussed the  findings and recommendations with the patient. Results were also provided in writing at the conclusion of the visit.  If applicable, a reminder letter will be sent to the patient regarding the next appointment.   Original Report Authenticated By: Harmon Pier, M.D.   Mm Digital Diagnostic Unilat R  07/12/2012   *RADIOLOGY REPORT*  Clinical Data:  Ultrasound-guided core needle biopsy of a hypoechoic area at 2 o'clock 2 cm from the right nipple with clip placement.  Known invasive mammary carcinoma in the right breast. Unsuccessful right breast MRI guided biopsy earlier today.  DIGITAL DIAGNOSTIC RIGHT MAMMOGRAM  Comparison:  Previous exams.  Findings:  Films are performed following ultrasound guided biopsy of a small hypoechoic area at 2 o'clock 2 cm from the right nipple. The ribbon clip is positioned within the area of concern. The ribbon clip is approximately 2.6 cm anterior to the top hat clip from the original biopsy on 06/20/2012.  IMPRESSION: Appropriate clip placement following ultrasound-guided core needle biopsy of a hypoechoic area at 2 o'clock 2 cm in the right nipple.   Original Report Authenticated By: Cain Saupe, M.D.   Mm Digital Diagnostic Unilat R  06/20/2012   *RADIOLOGY REPORT*  Clinical Data:  52 year old female - evaluate clip placement following ultrasound guided right breast biopsy.  DIGITAL DIAGNOSTIC RIGHT MAMMOGRAM  Comparison:  Previous exams.  Findings:  Films are performed following ultrasound guided biopsy of the 7 x 6 x 17 mm hypoechoic mass at the 3 o'clock position of the right breast 4 cm from the nipple. The T shaped biopsy clip is in satisfactory position.  IMPRESSION: Satisfactory clip placement following ultrasound guided right breast biopsy.  Pathology will be followed.   Original Report Authenticated By: Harmon Pier, M.D.   Dg Fluoro Guide Cv Line-no Report  07/06/2012   CLINICAL DATA: port placement   FLOURO GUIDE CV LINE  Fluoroscopy was utilized by the requesting  physician.  No radiographic  interpretation.  [place] Rt Breast Bx W Loc Dev 1st Lesion Img Bx Spec US Guide  07/13/2012   **ADDENDUM** CREATED: 07/13/2012 14:11:45  I spoke with the patient by telephone on 07/13/2012 to discuss pathology results.  Pathology demonstrates  pseudoangiomatous stromal hyperplasia.  The findings were called to the office of Dr. Donell Beers. Although the imaging findings could be related to pseudoangiomatous stromal hyperplasia, excision of the anterior biopsy site is suggested at the time of patient's definitive surgery.  The patient is considering unilateral or bilateral mastectomy at this time.  The patient reports no problems at the biopsy site.  All questions were answered.  Recommendations:  Excision of the biopsy site. The patient is scheduled to begin chemotherapy in 4 days.  **END ADDENDUM** SIGNED BY: Blair Hailey. Manson Passey, M.D.  07/12/2012   *RADIOLOGY REPORT*  Clinical Data:  Unsuccessful MRI guided biopsy of the area of abnormal enhancement in the left anterior upper inner quadrant, anterior to the known invasive mammary carcinoma.  Second look ultrasound performed and a 5 x 4 x 4 mm hypoechoic area found at 2 o'clock 2 cm from the right nipple which will be biopsied.  ULTRASOUND GUIDED VACUUM ASSISTED CORE BIOPSY OF THE RIGHT BREAST  Sonography of the right upper inner quadrant was performed.  There is an ill-defined hypoechoic 5 x 4 x 4 mm area at 2 o'clock 2 cm from the right nipple which will be biopsied with ultrasound guidance.  The patient and I discussed the procedure of ultrasound-guided biopsy, including benefits and alternatives.  We discussed the high likelihood of a successful procedure. We discussed the risks of the procedure including infection, bleeding, tissue injury, clip migration, and inadequate sampling.  Written informed consent was given.  Using sterile technique, 2% lidocaine, ultrasound guidance, and a 12 gauge vacuum assisted needle, biopsy was performed of  the hypoechoic area at 2 o'clock 2 cm from the right nipple using a caudocranial approach.  At the conclusion of the procedure, a within tissue marker clip was deployed into the biopsy cavity. Follow-up 2-view mammogram was performed and dictated separately.  IMPRESSION: Ultrasound-guided biopsy of a 5 x 4 x 4 mm hypoechoic area at 2 o'clock 2 cm from the right nipple.  No apparent complications.   Original Report Authenticated By: Cain Saupe, M.D.   [place] Rt Breast Bx W Loc Dev 1st Lesion Img Bx Spec US Guide  06/21/2012   *RADIOLOGY REPORT*  Clinical Data:  Indeterminate 7 x 6 x 17 mm hypoechoic mass in the inner right breast - for tissue sampling.  ULTRASOUND GUIDED VACUUM ASSISTED CORE BIOPSY OF THE RIGHT BREAST  Comparison: Previous exams.  I met with the patient and we discussed the procedure of ultrasound- guided biopsy, including benefits and alternatives.  We discussed the high likelihood of a successful procedure. We discussed the risks of the procedure including infection, bleeding, tissue injury, clip migration, and inadequate sampling.  Informed written consent was given.  Using sterile technique, 2% lidocaine ultrasound guidance and a 12 gauge vacuum assisted needle biopsy was performed of the 7 x 6 x 17 mm hypoechoic mass at the 3 o'clock position of the right breast 4 cm from the nipple using a lateral approach.  At the conclusion of the procedure, a T shaped tissue marker clip was deployed into the biopsy cavity.  Follow-up 2-view mammogram was performed and dictated separately.  IMPRESSION: Ultrasound-guided biopsy of right breast mass.  No apparent complications.  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 Histology  correlates with imaging findings.  The patient was contacted by phone on 06/21/2012 and these results given to her which she understood. Her questions were answered. She was encouraged to obtain breast cancer educational material from the [facility] at her  convenience. The patient had no complaints with her biopsy site.  Recommend surgery/oncology consultation.  An appointment at the [facility]isciplinary [facility] has been scheduled for 06/28/2012. Recommend bilateral breast MRI, which has been scheduled for 06/26/2012. The patient was informed of these appointments.   Original Report Authenticated By: Harmon Pier, M.D.    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 Patient had other areas seen on MRI biopsy of the second mass was negative. Patient has had her Port-A-Cath placed. She is proceeding neoadjuvant chemotherapy to possibly conserve her breast. Chemotherapy consisted of Adriamycin Cytoxan given dose dense for 4 cycles starting on 07/17/2012 with Neulasta support.  This was followed by Taxol/Carbo which started on 09/11/12.  She received 7 cycles of this and it was discontinued early due to neuropathy.  She will now receive Gemzar/carbo beginning 11/06/12.    #2 we discussed risks benefits and side effects of chemotherapy. She has had an echocardiogram as well as chemotherapy teaching class. Port-A-Cath was placed by Dr. Donell Beers. The port is functioning well.  #3 Gemzar/Carbo starting 11/06/12  She will receive 3 cycles.    #4 vaginal herpes outbreak  #5 neuropathy  PLAN:  #1  Ms. Welte is doing well today.  Her labs are stable. I reviewed them with her in detail.  I also reviewed her MRI of the breasts which demonstrated resolution of previous enhancement from the breast cancer.  She is very happy with this news.    #2 She will continue on Gabapentin TID for the neuropathy.    #3 She will return next week for her final cycle of Gemzar/Carbo.  She will f/u with Dr. Donell Beers on 12/01/12 for surgical planning.    All questions were answered. The patient knows to call the clinic with any problems, questions or concerns. We can certainly see the patient much sooner if  necessary.  I spent 25 minutes counseling the patient face to face. The total time spent in the appointment was 30 minutes.  Illa Level, NP Medical Oncology [facility] [phone]  11/28/2012, 9:48 AM        OFFICE PROGRESS NOTE  CC  [name], MD [address] [place] [address] Dr. Everardo Beals Dr. Chipper Herb  DIAGNOSIS: 53 year old female with T1 N0, clinical stage I triple negative invasive ductal carcinoma of the right breast  PRIOR THERAPY:  #1 patient was seen in the multidisciplinary breast clinic for evaluation of triple-negative T1 N0 invasive ductal carcinoma of the right breast. Patient had a palpable mass at the 3:00 position. She was seen at the breast Center on 06/20/2012 she had a mammogram and ultrasound showing a 1.7 cm mass at the 3:00 position. 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 Tumor was grade 3.  #2 MRIs performed on 06/26/2012 showed no biopsy mass at 3:00 but also 2 other adjacent masses at 1 at 2:00 measuring 0.8 x 1.6 x 2.2 cm andalso 6 mm ill-defined mass posterior to the biopsy-proven malignancy.  #3 patient has underwent Port-A-Cath placement in order to begin neoadjuvant chemotherapy initially consisting of Adriamycin Cytoxan to be given dose dense for 4  cycles.  She underwent this from 07/17/12 through 09/04/12 and it was followed by weekly Taxol and carboplatinum for a total of 12 weeks starting on 09/11/12.  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  She will receive Gemzar Carbo starting 11/06/12.  The plan is for her to receive 3 cycles.    CURRENT THERAPY: Gemzar Carbo cycle 2 day 1  INTERVAL HISTORY: Jaime Murillo 52 y.o. female returns for evaluation prior to her second cycle of Gemzar [place]. She is feeling well today.  We called in Gabapentin to her pharmacy last week.  She has  yet to pick it up, and is c/o numbness in her fingertips and toes because she hasn't taken since Friday.  She denies fevers, chills, nausea, vomiting, constipation, diarrhea, or any further concerns.    MEDICAL HISTORY: Past Medical History  Diagnosis Date  . HSV infection   . NSVD (normal spontaneous vaginal delivery)   . Hypertension     takes HCTZ  . Urinary frequency   . Anemia     takes Ferrous Sulfate occasionally  . Breast cancer 2014    triple negative    ALLERGIES:  has No Known Allergies.  MEDICATIONS:  Current Outpatient Prescriptions  Medication Sig Dispense Refill  . Alum & Mag Hydroxide-Simeth (MAGIC MOUTHWASH W/LIDOCAINE) SOLN Take 5 mL by mouth 4 (four) times daily. Swish and spit  500 mL  0  . dexamethasone (DECADRON) 4 MG tablet Take 1 tablet (4 mg total) by mouth 2 (two) times daily with a meal. Take 2 tabs every day on the day after chemo an then take 2 tabs twice a day with food for 2 days.  60 tablet  0  . dexamethasone (DECADRON) 4 MG tablet Take 2 tablets (8 mg total) by mouth 2 (two) times daily with a meal. Take daily starting the day after chemotherapy for 2 days. Take with food.  30 tablet  1  . ferrous sulfate 325 (65 FE) MG tablet Take 325 mg by mouth daily with breakfast.      . gabapentin (NEURONTIN) 100 MG capsule Take 3 tablets (300mg s total) twice a day and take 4 tablets (400mg s total) at bedtime.  300 capsule  2  . hydrochlorothiazide (HYDRODIURIL) 25 MG tablet Take 1 tablet (25 mg total) by mouth daily.  30 tablet  11  . lidocaine-prilocaine (EMLA) cream Apply 1 application topically as needed (apply to PAC site 1-2 hours prior to treatment.).      Marland Kitchen LORazepam (ATIVAN) 0.5 MG tablet Take 1 tablet (0.5 mg total) by mouth every 6 (six) hours as needed (Nausea or vomiting).  30 tablet  0  . ondansetron (ZOFRAN) 8 MG tablet Take 1 tablet (8 mg total) by mouth every 8 (eight) hours as needed for nausea (Take 1 pill by mouth twice a day as needed  for  nausea and vomitting starting 3rd day after chemo).  20 tablet  3  . ondansetron (ZOFRAN) 8 MG tablet Take 1 tablet (8 mg total) by mouth 2 (two) times daily. Take two times a day starting the day after chemo for 2 days. Then take two times a day as needed for nausea or vomiting.  30 tablet  1  . oxyCODONE-acetaminophen (ROXICET) 5-325 MG per tablet Take 1-2 tablets by mouth every 4 (four) hours as needed for pain.  30 tablet  0  . prochlorperazine (COMPAZINE) 10 MG tablet Take 1 tablet (10 mg total)  by mouth every 6 (six) hours as needed (Nausea or vomiting).  30 tablet  1  . prochlorperazine (COMPAZINE) 25 MG suppository Place 25 mg rectally every 12 (twelve) hours as needed for nausea (as needed for nausea and vomitting).      Marland Kitchen UNABLE TO FIND Apply 1 application topically once. 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  1 application  0  . valACYclovir (VALTREX) 500 MG tablet Take 1 tablet (500 mg total) by mouth daily as needed. For herpes  30 tablet  2   No current facility-administered medications for this visit.    SURGICAL HISTORY:  Past Surgical History  Procedure Laterality Date  . Tubal ligation  2003    LTL  . Portacath placement N/A 07/06/2012    Procedure: INSERTION PORT-A-CATH;  Surgeon: Almond Lint, MD;  Location: [facility] OR;  Service: General;  Laterality: N/A;    REVIEW OF SYSTEMS: A 10 point review of systems was conducted and is otherwise negative except for what is noted above.     PHYSICAL EXAMINATION: Blood pressure 135/83, pulse 98, temperature 98.5 F (36.9 C), temperature source Oral, resp. rate 18, height 5\' 3"  (1.6 m), weight 129 lb 1.6 oz (58.559 kg), last menstrual period 06/07/2012. Body mass index is 22.87 kg/(m^2). General: Patient is a well appearing female in no acute distress HEENT: PERRLA, sclerae anicteric no conjunctival pallor, MMM Neck: supple, no palpable adenopathy Lungs: clear to auscultation  bilaterally, no wheezes, rhonchi, or rales Cardiovascular: regular rate rhythm, S1, S2, no murmurs, rubs or gallops Abdomen: Soft, non-tender, non-distended, normoactive bowel sounds, no HSM Extremities: warm and well perfused, no clubbing, cyanosis, or edema Skin: No rashes or lesions Neuro: Non-focal Breast examination: Left breast no masses or nipple discharge Right breast reveals 3 discrete masses at the 3:00 position 2:00 position.  It is nontender there is no nipple discharge. softer. ECOG PERFORMANCE STATUS: 0 - Asymptomatic  LABORATORY DATA: Lab Results  Component Value Date   WBC 15.3* 11/27/2012   HGB 10.6* 11/27/2012   HCT 31.0* 11/27/2012   MCV 95.4 11/27/2012   PLT 107* 11/27/2012      Chemistry      Component Value Date/Time   NA 142 11/27/2012 1442   NA 137 06/06/2012 0853   K 3.6 11/27/2012 1442   K 3.8 06/06/2012 0853   CL 104 07/17/2012 1357   CL 102 06/06/2012 0853   CO2 30* 11/27/2012 1442   CO2 26 06/06/2012 0853   BUN 22.4 11/27/2012 1442   BUN 12 06/06/2012 0853   CREATININE 0.7 11/27/2012 1442   CREATININE 0.70 06/06/2012 0853   CREATININE 0.85 04/24/2009 1114      Component Value Date/Time   CALCIUM 9.5 11/27/2012 1442   CALCIUM 9.3 06/06/2012 0853   ALKPHOS 137 11/27/2012 1442   ALKPHOS 35* 06/06/2012 0853   AST 15 11/27/2012 1442   AST 14 06/06/2012 0853   ALT 19 11/27/2012 1442   ALT 9 06/06/2012 0853   BILITOT 0.21 11/27/2012 1442   BILITOT 0.8 06/06/2012 0853     ADDITIONAL INFORMATION: PROGNOSTIC INDICATORS - ACIS Results: IMMUNOHISTOCHEMICAL AND MORPHOMETRIC ANALYSIS BY THE AUTOMATED CELLULAR IMAGING SYSTEM (ACIS) Estrogen Receptor: 0%, NEGATIVE Progesterone Receptor: 0%, NEGATIVE Proliferation Marker Ki67: 95% COMMENT: The negative hormone receptor study(ies) in this case have an internal positive control. REFERENCE RANGE ESTROGEN RECEPTOR NEGATIVE <1% POSITIVE =>1% PROGESTERONE RECEPTOR NEGATIVE <1% POSITIVE =>1% All controls stained  appropriately Pecola Leisure MD Pathologist, Electronic  Signature ( Signed 06/28/2012) CHROMOGENIC IN-SITU HYBRIDIZATION Results: HER-2/NEU BY CISH - NO AMPLIFICATION OF HER-2 DETECTED. RESULT RATIO OF HER2: CEP 17 SIGNALS 1.26 1 of 3 Duplicate copy FINAL for SIRENITY, SHEW [phone]) ADDITIONAL INFORMATION:(continued) AVERAGE HER2 COPY NUMBER PER CELL 2.45 REFERENCE RANGE NEGATIVE HER2/Chr17 Ratio <2.0 and Average HER2 copy number <4.0 EQUIVOCAL HER2/Chr17 Ratio <2.0 and Average HER2 copy number 4.0 and <6.0 POSITIVE HER2/Chr17 Ratio >=2.0 and/or Average HER2 copy number >=6.0 Jimmy Picket MD Pathologist, Electronic Signature ( Signed 06/26/2012) FINAL DIAGNOSIS Diagnosis Breast, right, needle core biopsy, 3:00 position - INVASIVE DUCTAL CARCINOMA SEE COMMENT. - DUCTAL CARCINOMA IN SITU Microscopic Comment Although the grade of tumor is best assessed at resection, with these biopsies, both the invasive and in situ carcinoma are grade III. Breast prognostic studies are pending and will be reported in an addendum. The case is reviewed with Dr. Raynald Blend who concurs. (CRR:gt, 06/21/12) [place] RUND DO Pathologist, Electronic FINAL DIAGNOSIS Diagnosis Breast, right, needle core biopsy, mass - BENIGN BREAST TISSUE WITH FOCAL PSEUDOANGIOMATOUS STROMAL HYPERPLASIA (PASH). NO EVIDENCE OF MALIGNANCY. Microscopic Comment Called to the [facility] on 07/13/12. (JDP:caf 07/13/12)  RADIOGRAPHIC STUDIES:  Dg Chest 2 View  07/04/2012   *RADIOLOGY REPORT*  Clinical Data: Insertion of Port-A-Cath.  Breast cancer. Hypertension.  CHEST - 2 VIEW  Comparison: CT of the chest on 07/03/2012  Findings: Cardiomediastinal silhouette is within normal limits. The lungs are free of focal consolidations and pleural effusions. Bony structures have a normal appearance.  Contrast is identified within the colon following CT of the abdomen.  IMPRESSION: Negative exam.   Original Report Authenticated  By: Norva Pavlov, M.D.   Ct Chest W Contrast  07/03/2012   *RADIOLOGY REPORT*  Clinical Data:  Breast cancer.  No therapy yet.  Lower inner quadrant of right breast.  CT CHEST, ABDOMEN AND PELVIS WITH CONTRAST  Technique: Contiguous axial images of the chest abdomen and pelvis were obtained after IV contrast administration.  Contrast: 80 ml Omnipaque-300  Comparison: Breast MR of 06/26/2012.  No prior CTs.  CT CHEST  Findings: Lung windows demonstrate minimal left lower lobe subpleural nodularity at 3 mm on image 40/series 4.  Right lung clear.  Soft tissue windows demonstrate small bilateral axillary nodes, without adenopathy.  No subpectoral adenopathy. No supraclavicular adenopathy.  Normal heart size, without pericardial effusion.  No central pulmonary embolism, on this non-dedicated study.  No mediastinal or hilar adenopathy.  No internal mammary adenopathy. Minimal residual thymic tissue in the anterior mediastinum (image 17/series 2).  IMPRESSION:  1. No acute process or evidence of metastatic disease in the chest. 2.  Probable subpleural lymph node in the left lower lobe. Recommend attention on follow-up.  CT ABDOMEN AND PELVIS  Findings:  Mild early phase of hepatic enhancement which decreases sensitivity for focal liver lesions.  None identified.  Normal spleen, stomach, pancreas, gallbladder, biliary tract, adrenal glands, kidneys. No retroperitoneal or retrocrural adenopathy.  Normal colon, appendix, and terminal ileum.  Normal small bowel without abdominal ascites.  No pelvic adenopathy.    Normal urinary bladder and uterus.  No adnexal mass.  Trace free pelvic fluid is likely physiologic.  Transitional right-sided lumbosacral anatomy.  Degenerative disc disease at the lumbosacral junction.  IMPRESSION:  No acute process or evidence of metastatic disease in the abdomen or pelvis.   Original Report Authenticated By: Jeronimo Greaves, M.D.   US Breast Right  06/29/2012   *RADIOLOGY REPORT*  Clinical  Data:  52 year old female with  newly diagnosed right breast cancer.  Suspicious abnormal enhancement anterior and superior to the biopsy-proven neoplasm - for second look ultrasound evaluation.  RIGHT BREAST ULTRASOUND  Comparison:  Prior mammograms, ultrasounds and MRI.  Findings: Ultrasound is performed, showing the biopsy-proven neoplasm at the 3 o'clock position of the right breast 4 cm from the nipple. No other discrete sonographic abnormalities are identified, specifically medial, superior, and anterior to the biopsy-proven neoplasm.  IMPRESSION: Patchy abnormal enhancement medial superior to the biopsy-proven neoplasm identified on recent MRI is not visualized sonographically as a discrete abnormality.  MR guided biopsy is recommended if breast conservation surgery is considered.  BI-RADS CATEGORY 6:  Known biopsy-proven malignancy - appropriate action should be taken.  RECOMMENDATION: MR guided right breast biopsy, which will be scheduled by our office.  I have discussed the findings and recommendations with the patient. Results were also provided in writing at the conclusion of the visit.  If applicable, a reminder letter will be sent to the patient regarding the next appointment.   Original Report Authenticated By: Harmon Pier, M.D.   US Breast Right  06/20/2012   *RADIOLOGY REPORT*  Clinical Data:  52 year old female for annual bilateral mammograms and palpable mass in the upper and inner right breast discovered on clinical examination.  DIGITAL DIAGNOSTIC BILATERAL MAMMOGRAM WITH CAD AND RIGHT BREAST ULTRASOUND:  Comparison:  03/21/2009 and prior mammograms dating back to 01/21/2005  Findings:  ACR Breast Density Category 4: The breast tissue is extremely dense.  Routine and spot compression views of the right breast demonstrate no evidence of suspicious mass or distortion. Scattered punctate calcifications within the breasts bilaterally are again identified.  Mammographic images were processed with  CAD.  On physical exam, thickening is identified at the 12 o'clock position of the right breast 4 cm from the nipple. A firm palpable mass identified at the 3 o'clock position of the right breast 4 cm from the nipple.  Ultrasound is performed, showing no evidence of solid or cystic mass, distortion or abnormal areas of shadowing at the 12 o'clock position of the right breast. A 7 x 6 x 17 mm slightly irregular oval hypoechoic mass with calcifications is horizontally oriented at the 3 o'clock position of the right breast 4 cm from the nipple.  IMPRESSION: Indeterminate 7 x 6 x 17 mm hypoechoic mass at the 3 o'clock position of the right breast.  Tissue sampling is recommended.  No mammographic evidence of breast malignancy bilaterally.  No mammographic, palpable or sonographic abnormality at the 12 o'clock position of the right breast, in the area of palpable concern.  BI-RADS CATEGORY 4:  Suspicious abnormality - biopsy should be considered.  RECOMMENDATION:  Ultrasound guided right breast biopsy, which will be performed today but dictated in a separate report.  I have discussed the findings and recommendations with the patient. Results were also provided in writing at the conclusion of the visit.  If applicable, a reminder letter will be sent to the patient regarding the next appointment.   Original Report Authenticated By: Harmon Pier, M.D.   Ct Abdomen Pelvis W Contrast  07/03/2012   *RADIOLOGY REPORT*  Clinical Data:  Breast cancer.  No therapy yet.  Lower inner quadrant of right breast.  CT CHEST, ABDOMEN AND PELVIS WITH CONTRAST  Technique: Contiguous axial images of the chest abdomen and pelvis were obtained after IV contrast administration.  Contrast: 80 ml Omnipaque-300  Comparison: Breast MR of 06/26/2012.  No prior CTs.  CT CHEST  Findings: Lung windows  demonstrate minimal left lower lobe subpleural nodularity at 3 mm on image 40/series 4.  Right lung clear.  Soft tissue windows demonstrate small  bilateral axillary nodes, without adenopathy.  No subpectoral adenopathy. No supraclavicular adenopathy.  Normal heart size, without pericardial effusion.  No central pulmonary embolism, on this non-dedicated study.  No mediastinal or hilar adenopathy.  No internal mammary adenopathy. Minimal residual thymic tissue in the anterior mediastinum (image 17/series 2).  IMPRESSION:  1. No acute process or evidence of metastatic disease in the chest. 2.  Probable subpleural lymph node in the left lower lobe. Recommend attention on follow-up.  CT ABDOMEN AND PELVIS  Findings:  Mild early phase of hepatic enhancement which decreases sensitivity for focal liver lesions.  None identified.  Normal spleen, stomach, pancreas, gallbladder, biliary tract, adrenal glands, kidneys. No retroperitoneal or retrocrural adenopathy.  Normal colon, appendix, and terminal ileum.  Normal small bowel without abdominal ascites.  No pelvic adenopathy.    Normal urinary bladder and uterus.  No adnexal mass.  Trace free pelvic fluid is likely physiologic.  Transitional right-sided lumbosacral anatomy.  Degenerative disc disease at the lumbosacral junction.  IMPRESSION:  No acute process or evidence of metastatic disease in the abdomen or pelvis.   Original Report Authenticated By: Jeronimo Greaves, M.D.   Mr Breast Right W Wo Contrast  07/12/2012   *RADIOLOGY REPORT*  Clinical Data:  Known invasive mammary carcinoma in the right breast at 3 o'clock posteriorly.  Additional site of abnormal enhancement in the right upper inner quadrant closer to the nipple for possible biopsy.  ATTEMPTED MRI GUIDED VACUUM ASSISTED BIOPSY OF THE RIGHT BREAST WITHOUT AND WITH CONTRAST  Comparison: Previous exams.  Technique: Multiplanar, multisequence MR images of the right breast were obtained prior to and following the intravenous administration of 9 ml of Mulithance.  I met with the patient, and we discussed the procedure of MRI guided biopsy, including risks,  benefits, and alternatives. Specifically, we discussed the risks of infection, bleeding, tissue injury, clip migration, and inadequate sampling.  Informed, written consent was given.  Using sterile technique, 2% Lidocaine, MRI guidance, and a 9 gauge vacuum assisted device, biopsy was attempted using a mediolateral approach.  However, the procedure was unsuccessful due to the extremely thin breast compression.  IMPRESSION:  Unsuccessful MRI guided vacuum-assisted biopsy of the right breast.  [facility] evaluation with ultrasound.   Original Report Authenticated By: Cain Saupe, M.D.   Mr Breast Bilateral W Wo Contrast  06/26/2012   *RADIOLOGY REPORT*  Clinical Data: History of new diagnosis of breast cancer from recent ultrasound guided core needle biopsy right breast 06/20/2012 demonstrating invasive ductal carcinoma with DCIS.  GFR 89, creatinine 0.7 on 06/06/2012.  BILATERAL BREAST MRI WITH AND WITHOUT CONTRAST  Technique: Multiplanar, multisequence MR images of both breasts were obtained prior to and following the intravenous administration of 10ml of multihance.  Three dimensional images were evaluated at the independent DynaCad workstation.  Comparison:  Recent mammograms and ultrasound.  Findings: Examination demonstrates mild background parenchymal enhancement.  Right breast:  There is evidence of an ovoid gently lobulated and somewhat ill-defined heterogeneously enhancing mass over the deep third of the inner midportion of the right breast representing patient's biopsy-proven malignancy.  This mass contains central clip artifact and measures approximately 0.9 x 1.9 x 1.0 cm. The anterior border of this mass is located 5.5 cm from the nipple. There is a 6 mm slightly ill-defined enhancing ovoid mass  6 mm posterior lateral to the biopsy-proven malignancy lying just superficial to the pectoralis muscle.  There is also clumped non mass enhancement anterior and  slightly superior to the biopsy- proven malignancy in the middle third at approximately the 2 o'clock position.  This measures approximately 0.8 x 1.6 x 1.2 cm and is located 3 cm from the nipple.  This is suspicious for multifocal disease.  No suspicious axillary or internal mammary lymph nodes.  Left breast:  No suspicious mass, enhancement or adenopathy.  IMPRESSION: Known biopsy-proven malignancy over the deep third of the inner mid right breast.  6 mm ill-defined enhancing ovoid mass 6 mm posterior lateral to the biopsy-proven malignancy and lying just superficial to the pectoralis muscle.  Clumped non mass enhancement anterior and superior to patient's known malignancy at approximately the 2 o'clock position measuring 0.8 x 1.6 x 2.2 cm.  Findings likely represent multi focal disease.  RECOMMENDATION: Recommend second look ultrasound and core biopsy if feasible of the clumped non mass enhancement at approximately the 2 o'clock position right breast anterior/superior to the known malignancy. If not seen by ultrasound, recommend proceeding to MR guided biopsy of this suspicious abnormality.  THREE-DIMENSIONAL MR IMAGE RENDERING ON INDEPENDENT WORKSTATION:  Three-dimensional MR images were rendered by post-processing of the original MR data on an independent workstation.  The three- dimensional MR images were interpreted, and findings were reported in the accompanying complete MRI report for this study.  BI-RADS CATEGORY 0:  Incomplete.  Need additional imaging evaluation and/or prior mammograms for comparison.   Original Report Authenticated By: Elberta Fortis, M.D.   Dg Chest Port 1 View  07/06/2012   *RADIOLOGY REPORT*  Clinical Data: Post left-sided port catheter insertion  PORTABLE CHEST - 1 VIEW  Comparison: 07/04/2012  Findings:  Grossly unchanged cardiac silhouette and mediastinal contours. Interval placement of a left anterior chest wall subclavian vein approach port-a-catheter with tip projecting over  the superior cavoatrial junction. Grossly unchanged symmetric biapical pleural parenchymal thickening.  No pneumothorax.  No focal airspace opacities.  No pleural effusion.  No evidence of edema.  Unchanged bones.  IMPRESSION: Interval placement of a left anterior chest wall subclavian vein approach port-a-catheter with tip overlying the superior cavoatrial junction.  No pneumothorax.   Original Report Authenticated By: Tacey Ruiz, MD   Mm Digital Diagnostic Bilat  06/20/2012   *RADIOLOGY REPORT*  Clinical Data:  52 year old female for annual bilateral mammograms and palpable mass in the upper and inner right breast discovered on clinical examination.  DIGITAL DIAGNOSTIC BILATERAL MAMMOGRAM WITH CAD AND RIGHT BREAST ULTRASOUND:  Comparison:  03/21/2009 and prior mammograms dating back to 01/21/2005  Findings:  ACR Breast Density Category 4: The breast tissue is extremely dense.  Routine and spot compression views of the right breast demonstrate no evidence of suspicious mass or distortion. Scattered punctate calcifications within the breasts bilaterally are again identified.  Mammographic images were processed with CAD.  On physical exam, thickening is identified at the 12 o'clock position of the right breast 4 cm from the nipple. A firm palpable mass identified at the 3 o'clock position of the right breast 4 cm from the nipple.  Ultrasound is performed, showing no evidence of solid or cystic mass, distortion or abnormal areas of shadowing at the 12 o'clock position of the right breast. A 7 x 6 x 17 mm slightly irregular oval hypoechoic mass with calcifications is horizontally oriented at the 3 o'clock position of the right breast 4 cm from the nipple.  IMPRESSION: Indeterminate 7 x 6 x 17 mm hypoechoic mass at the 3 o'clock position of the right breast.  Tissue sampling is recommended.  No mammographic evidence of breast malignancy bilaterally.  No mammographic, palpable or sonographic abnormality at the 12  o'clock position of the right breast, in the area of palpable concern.  BI-RADS CATEGORY 4:  Suspicious abnormality - biopsy should be considered.  RECOMMENDATION:  Ultrasound guided right breast biopsy, which will be performed today but dictated in a separate report.  I have discussed the findings and recommendations with the patient. Results were also provided in writing at the conclusion of the visit.  If applicable, a reminder letter will be sent to the patient regarding the next appointment.   Original Report Authenticated By: Harmon Pier, M.D.   Mm Digital Diagnostic Unilat R  07/12/2012   *RADIOLOGY REPORT*  Clinical Data:  Ultrasound-guided core needle biopsy of a hypoechoic area at 2 o'clock 2 cm from the right nipple with clip placement.  Known invasive mammary carcinoma in the right breast. Unsuccessful right breast MRI guided biopsy earlier today.  DIGITAL DIAGNOSTIC RIGHT MAMMOGRAM  Comparison:  Previous exams.  Findings:  Films are performed following ultrasound guided biopsy of a small hypoechoic area at 2 o'clock 2 cm from the right nipple. The ribbon clip is positioned within the area of concern. The ribbon clip is approximately 2.6 cm anterior to the top hat clip from the original biopsy on 06/20/2012.  IMPRESSION: Appropriate clip placement following ultrasound-guided core needle biopsy of a hypoechoic area at 2 o'clock 2 cm in the right nipple.   Original Report Authenticated By: Cain Saupe, M.D.   Mm Digital Diagnostic Unilat R  06/20/2012   *RADIOLOGY REPORT*  Clinical Data:  52 year old female - evaluate clip placement following ultrasound guided right breast biopsy.  DIGITAL DIAGNOSTIC RIGHT MAMMOGRAM  Comparison:  Previous exams.  Findings:  Films are performed following ultrasound guided biopsy of the 7 x 6 x 17 mm hypoechoic mass at the 3 o'clock position of the right breast 4 cm from the nipple. The T shaped biopsy clip is in satisfactory position.  IMPRESSION: Satisfactory clip  placement following ultrasound guided right breast biopsy.  Pathology will be followed.   Original Report Authenticated By: Harmon Pier, M.D.   Dg Fluoro Guide Cv Line-no Report  07/06/2012   CLINICAL DATA: port placement   FLOURO GUIDE CV LINE  Fluoroscopy was utilized by the requesting physician.  No radiographic  interpretation.    [place] Rt Breast Bx W Loc Dev 1st Lesion Img Bx Spec US Guide  07/13/2012   **ADDENDUM** CREATED: 07/13/2012 14:11:45  I spoke with the patient by telephone on 07/13/2012 to discuss pathology results.  Pathology demonstrates  pseudoangiomatous stromal hyperplasia.  The findings were called to the office of Dr. Donell Beers. Although the imaging findings could be related to pseudoangiomatous stromal hyperplasia, excision of the anterior biopsy site is suggested at the time of patient's definitive surgery.  The patient is considering unilateral or bilateral mastectomy at this time.  The patient reports no problems at the biopsy site.  All questions were answered.  Recommendations:  Excision of the biopsy site. The patient is scheduled to begin chemotherapy in 4 days.  **END ADDENDUM** SIGNED BY: Blair Hailey. Manson Passey, M.D.  07/12/2012   *RADIOLOGY REPORT*  Clinical Data:  Unsuccessful MRI guided biopsy of the area of abnormal enhancement in the left anterior upper inner quadrant, anterior to the known invasive mammary carcinoma.  Second look  ultrasound performed and a 5 x 4 x 4 mm hypoechoic area found at 2 o'clock 2 cm from the right nipple which will be biopsied.  ULTRASOUND GUIDED VACUUM ASSISTED CORE BIOPSY OF THE RIGHT BREAST  Sonography of the right upper inner quadrant was performed.  There is an ill-defined hypoechoic 5 x 4 x 4 mm area at 2 o'clock 2 cm from the right nipple which will be biopsied with ultrasound guidance.  The patient and I discussed the procedure of ultrasound-guided biopsy, including benefits and alternatives.  We discussed the high likelihood of a successful  procedure. We discussed the risks of the procedure including infection, bleeding, tissue injury, clip migration, and inadequate sampling.  Written informed consent was given.  Using sterile technique, 2% lidocaine, ultrasound guidance, and a 12 gauge vacuum assisted needle, biopsy was performed of the hypoechoic area at 2 o'clock 2 cm from the right nipple using a caudocranial approach.  At the conclusion of the procedure, a within tissue marker clip was deployed into the biopsy cavity. Follow-up 2-view mammogram was performed and dictated separately.  IMPRESSION: Ultrasound-guided biopsy of a 5 x 4 x 4 mm hypoechoic area at 2 o'clock 2 cm from the right nipple.  No apparent complications.   Original Report Authenticated By: Cain Saupe, M.D.   [place] Rt Breast Bx W Loc Dev 1st Lesion Img Bx Spec US Guide  06/21/2012   *RADIOLOGY REPORT*  Clinical Data:  Indeterminate 7 x 6 x 17 mm hypoechoic mass in the inner right breast - for tissue sampling.  ULTRASOUND GUIDED VACUUM ASSISTED CORE BIOPSY OF THE RIGHT BREAST  Comparison: Previous exams.  I met with the patient and we discussed the procedure of ultrasound- guided biopsy, including benefits and alternatives.  We discussed the high likelihood of a successful procedure. We discussed the risks of the procedure including infection, bleeding, tissue injury, clip migration, and inadequate sampling.  Informed written consent was given.  Using sterile technique, 2% lidocaine ultrasound guidance and a 12 gauge vacuum assisted needle biopsy was performed of the 7 x 6 x 17 mm hypoechoic mass at the 3 o'clock position of the right breast 4 cm from the nipple using a lateral approach.  At the conclusion of the procedure, a T shaped tissue marker clip was deployed into the biopsy cavity.  Follow-up 2-view mammogram was performed and dictated separately.  IMPRESSION: Ultrasound-guided biopsy of right breast mass.  No apparent complications.  Final pathology demonstrates  INVASIVE DUCTAL CARCINOMA AND DCIS. Histology correlates with imaging findings.  The patient was contacted by phone on 06/21/2012 and these results given to her which she understood. Her questions were answered. She was encouraged to obtain breast cancer educational material from the [facility] at her convenience. The patient had no complaints with her biopsy site.  Recommend surgery/oncology consultation.  An appointment at the [facility] has been scheduled for 06/28/2012. Recommend bilateral breast MRI, which has been scheduled for 06/26/2012. The patient was informed of these appointments.   Original Report Authenticated By: Harmon Pier, M.D.    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 Patient had other areas seen on MRI biopsy of the second mass was negative. Patient has had her Port-A-Cath placed. She is proceeding neoadjuvant chemotherapy to possibly conserve her breast. Chemotherapy consisted of Adriamycin Cytoxan given dose dense for 4 cycles starting on 07/17/2012 with Neulasta support.  This was followed by Taxol/Carbo which started  on 09/11/12.  She received 7 cycles of this and it was discontinued early due to neuropathy.  She will now receive Gemzar/carbo beginning 11/06/12.    #2 we discussed risks benefits and side effects of chemotherapy. She has had an echocardiogram as well as chemotherapy teaching class. Port-A-Cath was placed by Dr. Donell Beers. The port is functioning well.  #3 Gemzar/Carbo starting 11/06/12  She will receive 3 cycles.    #4 vaginal herpes outbreak  #5 neuropathy  PLAN:  #1  Ms. Trieu is doing well. I reviewed her labs with her in detail.  They are stable.  She will proceed with chemotherapy.    #2 She will continue Gabapentin 300mg  in the morning and afternoon and 400mg  at night as her neuropathy is improving.  She will pick up her prescription for this today.     #3  She will undergo MRI breasts on 10/31 and have f/u with Dr. Donell Beers on 11/7.    All questions were answered. The patient knows to call the clinic with any problems, questions or concerns. We can certainly see the patient much sooner if necessary.  I spent 25 minutes counseling the patient face to face. The total time spent in the appointment was 30 minutes.  Illa Level, NP Medical Oncology [facility] [phone]  11/28/2012, 9:48 AM  ATTENDING'S ATTESTATION:  I personally reviewed patient's chart, examined patient myself, formulated the treatment plan as followed.    Patient is doing fabulously the present chemotherapy. She has done remarkably all of her. To proceed with treatment today. She has a followup with Dr. Donell Beers on 12/01/2012  Drue Second, MD Medical/Oncology [facility] [phone] (beeper) [phone] (Office)  12/01/2012, 2:03 PM

## 2012-12-01 ENCOUNTER — Telehealth (INDEPENDENT_AMBULATORY_CARE_PROVIDER_SITE_OTHER): Payer: Self-pay | Admitting: *Deleted

## 2012-12-01 ENCOUNTER — Telehealth (INDEPENDENT_AMBULATORY_CARE_PROVIDER_SITE_OTHER): Payer: Self-pay

## 2012-12-01 ENCOUNTER — Ambulatory Visit (INDEPENDENT_AMBULATORY_CARE_PROVIDER_SITE_OTHER): Payer: Medicaid Other | Admitting: General Surgery

## 2012-12-01 VITALS — BP 102/70 | HR 84 | Resp 14 | Ht 63.0 in | Wt 130.4 lb

## 2012-12-01 DIAGNOSIS — C50319 Malignant neoplasm of lower-inner quadrant of unspecified female breast: Secondary | ICD-10-CM

## 2012-12-01 DIAGNOSIS — C50311 Malignant neoplasm of lower-inner quadrant of right female breast: Secondary | ICD-10-CM

## 2012-12-01 NOTE — Telephone Encounter (Signed)
I called pt to inform her of her appt with Dr. Leta Baptist on 11/12 at 3:00pm.  I provided pt with their address and phone number.  Pt will call to reschedule if needed.

## 2012-12-01 NOTE — Patient Instructions (Signed)
I will refer you to Dr. Marzetta Board to discuss reconstruction.  Once I hear from her, we will get surgery scheduled.    I will see you back pre op to discuss.

## 2012-12-01 NOTE — Progress Notes (Signed)
HISTORY: Patient is 52 year old female who is finishing up her neoadjuvant treatment for a right-sided breast cancer that is triple negative. Her genetics were negative for BRCA mutation.  Her mass has disappeared radiologically. She is no longer able to palpate an abnormality.  She does still want to get bilateral mastectomies. She has not had any significant issues of chemotherapy.   PERTINENT REVIEW OF SYSTEMS: Otherwise negative x 11.    Filed Vitals:   12/01/12 1307  BP: 102/70  Pulse: 84  Resp: 14   Filed Weights   12/01/12 1307  Weight: 130 lb 6.4 oz (59.149 kg)    EXAM: Head: Normocephalic and atraumatic.  Eyes:  Conjunctivae are normal. Pupils are equal, round, and reactive to light. No scleral icterus.  Neck:  Normal range of motion. Neck supple. No tracheal deviation present. No thyromegaly present.  Breast:  No palpable masses or LAD.  No skin dimpling.   Resp: No respiratory distress, normal effort. Abd:  Abdomen is soft, non distended and non tender. No masses are palpable.  There is no rebound and no guarding.  Neurological: Alert and oriented to person, place, and time. Coordination normal.  Skin: Skin is warm and dry. No rash noted. No diaphoretic. No erythema. No pallor.  Psychiatric: Normal mood and affect. Normal behavior. Judgment and thought content normal.      ASSESSMENT AND PLAN:   Cancer of lower-inner quadrant of RIGHT female breast Pt to see Dr. Marzetta Board to determine if she will get bilateral mastectomies with or without reconstruction.    I will also contact her radiation oncologist regarding her axilla.    I will see her back pre op before surgery.        Maudry Diego, MD Surgical Oncology, General & Endocrine Surgery [facility]  [name], MD [name], MD

## 2012-12-01 NOTE — Assessment & Plan Note (Signed)
Pt to see Dr. Marzetta Board to determine if she will get bilateral mastectomies with or without reconstruction.    I will also contact her radiation oncologist regarding her axilla.    I will see her back pre op before surgery.

## 2012-12-01 NOTE — Telephone Encounter (Signed)
LMOV to reschedule pt's appt with Dr. Donell Beers.  Surgery needs to be scheduled before the end of this year.

## 2012-12-04 ENCOUNTER — Ambulatory Visit (HOSPITAL_BASED_OUTPATIENT_CLINIC_OR_DEPARTMENT_OTHER): Payer: Medicaid Other

## 2012-12-04 ENCOUNTER — Encounter: Payer: Self-pay | Admitting: Adult Health

## 2012-12-04 ENCOUNTER — Ambulatory Visit (HOSPITAL_BASED_OUTPATIENT_CLINIC_OR_DEPARTMENT_OTHER): Payer: Medicaid Other | Admitting: Adult Health

## 2012-12-04 ENCOUNTER — Telehealth: Payer: Self-pay | Admitting: Oncology

## 2012-12-04 ENCOUNTER — Other Ambulatory Visit (HOSPITAL_BASED_OUTPATIENT_CLINIC_OR_DEPARTMENT_OTHER): Payer: Medicaid Other | Admitting: Lab

## 2012-12-04 ENCOUNTER — Encounter: Payer: Self-pay | Admitting: Oncology

## 2012-12-04 VITALS — BP 132/89 | HR 96 | Temp 98.2°F | Resp 20 | Ht 63.0 in | Wt 133.2 lb

## 2012-12-04 DIAGNOSIS — Z8619 Personal history of other infectious and parasitic diseases: Secondary | ICD-10-CM

## 2012-12-04 DIAGNOSIS — Z452 Encounter for adjustment and management of vascular access device: Secondary | ICD-10-CM

## 2012-12-04 DIAGNOSIS — B009 Herpesviral infection, unspecified: Secondary | ICD-10-CM

## 2012-12-04 DIAGNOSIS — G589 Mononeuropathy, unspecified: Secondary | ICD-10-CM

## 2012-12-04 DIAGNOSIS — C50311 Malignant neoplasm of lower-inner quadrant of right female breast: Secondary | ICD-10-CM

## 2012-12-04 DIAGNOSIS — C50319 Malignant neoplasm of lower-inner quadrant of unspecified female breast: Secondary | ICD-10-CM

## 2012-12-04 DIAGNOSIS — C50919 Malignant neoplasm of unspecified site of unspecified female breast: Secondary | ICD-10-CM

## 2012-12-04 DIAGNOSIS — Z171 Estrogen receptor negative status [ER-]: Secondary | ICD-10-CM

## 2012-12-04 DIAGNOSIS — Z5111 Encounter for antineoplastic chemotherapy: Secondary | ICD-10-CM

## 2012-12-04 DIAGNOSIS — I10 Essential (primary) hypertension: Secondary | ICD-10-CM

## 2012-12-04 DIAGNOSIS — C50219 Malignant neoplasm of upper-inner quadrant of unspecified female breast: Secondary | ICD-10-CM

## 2012-12-04 LAB — COMPREHENSIVE METABOLIC PANEL (CC13)
ALT: 23 U/L (ref 0–55)
Albumin: 3.5 g/dL (ref 3.5–5.0)
Alkaline Phosphatase: 80 U/L (ref 40–150)
Chloride: 107 mEq/L (ref 98–109)
Glucose: 114 mg/dl (ref 70–140)
Potassium: 3.8 mEq/L (ref 3.5–5.1)
Sodium: 141 mEq/L (ref 136–145)
Total Bilirubin: <0.2 mg/dL (ref 0.20–1.20)
Total Protein: 6.3 g/dL — ABNORMAL LOW (ref 6.4–8.3)

## 2012-12-04 LAB — CBC WITH DIFFERENTIAL/PLATELET
Basophils Absolute: 0 10*3/uL (ref 0.0–0.1)
EOS%: 0 % (ref 0.0–7.0)
Eosinophils Absolute: 0 10*3/uL (ref 0.0–0.5)
HCT: 29.2 % — ABNORMAL LOW (ref 34.8–46.6)
HGB: 9.7 g/dL — ABNORMAL LOW (ref 11.6–15.9)
MONO#: 1 10*3/uL — ABNORMAL HIGH (ref 0.1–0.9)
NEUT#: 4.7 10*3/uL (ref 1.5–6.5)
NEUT%: 68.6 % (ref 38.4–76.8)
Platelets: 191 10*3/uL (ref 145–400)
RDW: 17.3 % — ABNORMAL HIGH (ref 11.2–14.5)
WBC: 6.8 10*3/uL (ref 3.9–10.3)
lymph#: 1.2 10*3/uL (ref 0.9–3.3)

## 2012-12-04 MED ORDER — CARBOPLATIN CHEMO INJECTION 450 MG/45ML
220.6000 mg | Freq: Once | INTRAVENOUS | Status: AC
  Administered 2012-12-04: 220 mg via INTRAVENOUS
  Filled 2012-12-04: qty 22

## 2012-12-04 MED ORDER — HYDROCHLOROTHIAZIDE 25 MG PO TABS: 25.0000 mg | ORAL_TABLET | Freq: Every day | ORAL | Status: DC

## 2012-12-04 MED ORDER — SODIUM CHLORIDE 0.9 % IV SOLN
800.0000 mg/m2 | Freq: Once | INTRAVENOUS | Status: AC
  Administered 2012-12-04: 1254 mg via INTRAVENOUS
  Filled 2012-12-04: qty 33

## 2012-12-04 MED ORDER — SODIUM CHLORIDE 0.9 % IV SOLN
Freq: Once | INTRAVENOUS | Status: AC
  Administered 2012-12-04: 17:00:00 via INTRAVENOUS

## 2012-12-04 MED ORDER — SODIUM CHLORIDE 0.9 % IJ SOLN
10.0000 mL | INTRAMUSCULAR | Status: DC | PRN
  Administered 2012-12-04: 10 mL
  Filled 2012-12-04: qty 10

## 2012-12-04 MED ORDER — ALTEPLASE 2 MG IJ SOLR
2.0000 mg | Freq: Once | INTRAMUSCULAR | Status: AC
  Administered 2012-12-04: 2 mg
  Filled 2012-12-04: qty 2

## 2012-12-04 MED ORDER — HEPARIN SOD (PORK) LOCK FLUSH 100 UNIT/ML IV SOLN
500.0000 [IU] | Freq: Once | INTRAVENOUS | Status: AC | PRN
  Administered 2012-12-04: 500 [IU]
  Filled 2012-12-04: qty 5

## 2012-12-04 MED ORDER — VALACYCLOVIR HCL 500 MG PO TABS: 500.0000 mg | ORAL_TABLET | Freq: Every day | ORAL | Status: DC | PRN

## 2012-12-04 MED ORDER — DEXAMETHASONE SODIUM PHOSPHATE 10 MG/ML IJ SOLN
10.0000 mg | Freq: Once | INTRAMUSCULAR | Status: AC
  Administered 2012-12-04: 10 mg via INTRAVENOUS

## 2012-12-04 MED ORDER — DEXAMETHASONE SODIUM PHOSPHATE 10 MG/ML IJ SOLN
INTRAMUSCULAR | Status: AC
  Filled 2012-12-04: qty 1

## 2012-12-04 MED ORDER — ONDANSETRON 8 MG/50ML IVPB (CHCC)
8.0000 mg | Freq: Once | INTRAVENOUS | Status: AC
  Administered 2012-12-04: 8 mg via INTRAVENOUS

## 2012-12-04 NOTE — Addendum Note (Signed)
Addended by: Lenn Sink I on: 12/04/2012 06:23 PM   Modules accepted: Orders

## 2012-12-04 NOTE — Addendum Note (Signed)
Addended by: Lenn Sink I on: 12/04/2012 04:47 PM   Modules accepted: Orders

## 2012-12-04 NOTE — Patient Instructions (Signed)
[facility] Discharge Instructions for Patients Receiving Chemotherapy  Today you received the following chemotherapy agents Gemzar/Carboplatin.  To help prevent nausea and vomiting after your treatment, we encourage you to take your nausea medication as prescribed.   If you develop nausea and vomiting that is not controlled by your nausea medication, call the clinic.   BELOW ARE SYMPTOMS THAT SHOULD BE REPORTED IMMEDIATELY:  *FEVER GREATER THAN 100.5 F  *CHILLS WITH OR WITHOUT FEVER  NAUSEA AND VOMITING THAT IS NOT CONTROLLED WITH YOUR NAUSEA MEDICATION  *UNUSUAL SHORTNESS OF BREATH  *UNUSUAL BRUISING OR BLEEDING  TENDERNESS IN MOUTH AND THROAT WITH OR WITHOUT PRESENCE OF ULCERS  *URINARY PROBLEMS  *BOWEL PROBLEMS  UNUSUAL RASH Items with * indicate a potential emergency and should be followed up as soon as possible.  Feel free to call the clinic you have any questions or concerns. The clinic phone number is [phone].    

## 2012-12-04 NOTE — Progress Notes (Signed)
Port accessed no blood return noted. Port flushes easily with no swelling, redness or pain. Patient repositioned several different ways with no blood return. Cath flo instilled at 1525

## 2012-12-04 NOTE — Progress Notes (Signed)
No blood return from [facility]. Flushed port and removed needle. Will notify MD of no blood return. Patient verbalized understanding.

## 2012-12-04 NOTE — Progress Notes (Addendum)
OFFICE PROGRESS NOTE  CC  [name], MD [address] [place] [address] Dr. Everardo Beals Dr. 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 Patient had a palpable mass at the 3:00 position. She was seen at the breast Center on 06/20/2012 she had a mammogram and ultrasound showing a 1.7 cm mass at the 3:00 position. 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 Tumor was grade 3.  #2 MRIs performed on 06/26/2012 showed no biopsy mass at 3:00 but also 2 other adjacent masses at 1 at 2:00 measuring 0.8 x 1.6 x 2.2 cm andalso 6 mm ill-defined mass posterior to the biopsy-proven malignancy.  #3 patient has underwent Port-A-Cath placement in order to begin neoadjuvant chemotherapy initially consisting of Adriamycin Cytoxan to be given dose dense for 4 cycles.  She underwent this from 07/17/12 through 09/04/12 and it was followed by weekly Taxol and carboplatinum for a total of 12 weeks starting on 09/11/12.  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  She will receive Gemzar Carbo starting 11/06/12.  The plan is for her to receive 3 cycles.    CURRENT THERAPY: Gemzar Carbo cycle 3 day 1  INTERVAL HISTORY: Leanndra R Tortorella 52 y.o. female returns for evaluation prior to her third and final cycle of Gemzar carbo.  She is doing well today.  She denies fevers, chills, nausea, vomiting, constipation, diarrhea.  Her numbness is stable and she is taking Gabapentin TID.  She is requesting a refill on HCTZ and Valtrex today.  Otherwise, a 10 point ROS is neg.   MEDICAL HISTORY: Past Medical History  Diagnosis Date  . HSV infection    . NSVD (normal spontaneous vaginal delivery)   . Hypertension     takes HCTZ  . Urinary frequency   . Anemia     takes Ferrous Sulfate occasionally  . Breast cancer 2014    triple negative    ALLERGIES:  has No Known Allergies.  MEDICATIONS:  Current Outpatient Prescriptions  Medication Sig Dispense Refill  . Alum & Mag Hydroxide-Simeth (MAGIC MOUTHWASH W/LIDOCAINE) SOLN Take 5 mL by mouth 4 (four) times daily. Swish and spit  500 mL  0  . dexamethasone (DECADRON) 4 MG tablet Take 1 tablet (4 mg total) by mouth 2 (two) times daily with a meal. Take 2 tabs every day on the day after chemo an then take 2 tabs twice a day with food for 2 days.  60 tablet  0  . dexamethasone (DECADRON) 4 MG tablet Take 2 tablets (8 mg total) by mouth 2 (two) times daily with a meal. Take daily starting the day after chemotherapy for 2 days. Take with food.  30 tablet  1  . ferrous sulfate 325 (65 FE) MG tablet Take 325 mg by mouth daily with breakfast.      . gabapentin (NEURONTIN) 100 MG capsule Take 3 tablets (300mg s total) twice a day and take 4 tablets (400mg s total) at bedtime.  300 capsule  2  . hydrochlorothiazide (HYDRODIURIL) 25 MG tablet Take 1 tablet (25 mg total) by mouth daily.  30 tablet  11  . lidocaine-prilocaine (EMLA) cream Apply 1 application topically as needed (apply  to [facility] site 1-2 hours prior to treatment.).      Marland Kitchen LORazepam (ATIVAN) 0.5 MG tablet Take 1 tablet (0.5 mg total) by mouth every 6 (six) hours as needed (Nausea or vomiting).  30 tablet  0  . ondansetron (ZOFRAN) 8 MG tablet Take 1 tablet (8 mg total) by mouth every 8 (eight) hours as needed for nausea (Take 1 pill by mouth twice a day as needed  for nausea and vomitting starting 3rd day after chemo).  20 tablet  3  . ondansetron (ZOFRAN) 8 MG tablet Take 1 tablet (8 mg total) by mouth 2 (two) times daily. Take two times a day starting the day after chemo for 2 days. Then take two times a day as needed for nausea or vomiting.   30 tablet  1  . oxyCODONE-acetaminophen (ROXICET) 5-325 MG per tablet Take 1-2 tablets by mouth every 4 (four) hours as needed for pain.  30 tablet  0  . prochlorperazine (COMPAZINE) 10 MG tablet Take 1 tablet (10 mg total) by mouth every 6 (six) hours as needed (Nausea or vomiting).  30 tablet  1  . prochlorperazine (COMPAZINE) 25 MG suppository Place 25 mg rectally every 12 (twelve) hours as needed for nausea (as needed for nausea and vomitting).      Marland Kitchen UNABLE TO FIND Apply 1 application topically once. 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  1 application  0  . valACYclovir (VALTREX) 500 MG tablet Take 1 tablet (500 mg total) by mouth daily as needed. For herpes  30 tablet  2   No current facility-administered medications for this visit.   Facility-Administered Medications Ordered in Other Visits  Medication Dose Route Frequency Provider Last Rate Last Dose  . sodium chloride 0.9 % injection 10 mL  10 mL Intracatheter PRN Illa Level, NP   10 mL at 12/04/12 1812    SURGICAL HISTORY:  Past Surgical History  Procedure Laterality Date  . Tubal ligation  2003    LTL  . Portacath placement N/A 07/06/2012    Procedure: INSERTION PORT-A-CATH;  Surgeon: Almond Lint, MD;  Location: [facility] OR;  Service: General;  Laterality: N/A;    REVIEW OF SYSTEMS: A 10 point review of systems was conducted and is otherwise negative except for what is noted above.     PHYSICAL EXAMINATION: Blood pressure 132/89, pulse 96, temperature 98.2 F (36.8 C), temperature source Oral, resp. rate 20, height 5\' 3"  (1.6 m), weight 133 lb 3.2 oz (60.419 kg), last menstrual period 06/07/2012. Body mass index is 23.6 kg/(m^2). General: Patient is a well appearing female in no acute distress HEENT: PERRLA, sclerae anicteric no conjunctival pallor, MMM Neck: supple, no palpable adenopathy Lungs: clear to auscultation bilaterally, no wheezes, rhonchi, or  rales Cardiovascular: regular rate rhythm, S1, S2, no murmurs, rubs or gallops Abdomen: Soft, non-tender, non-distended, normoactive bowel sounds, no HSM Extremities: warm and well perfused, no clubbing, cyanosis, or edema Skin: No rashes or lesions Neuro: Non-focal Breast examination: Left breast no masses or nipple discharge Right breast without palpable abnormality ECOG PERFORMANCE STATUS: 0 - Asymptomatic  LABORATORY DATA: Lab Results  Component Value Date   WBC 6.8 12/04/2012   HGB 9.7* 12/04/2012   HCT 29.2* 12/04/2012   MCV 98.0 12/04/2012   PLT 191 12/04/2012      Chemistry      Component Value Date/Time   NA 141 12/04/2012 1346   NA 137 06/06/2012 0853  K 3.8 12/04/2012 1346   K 3.8 06/06/2012 0853   CL 104 07/17/2012 1357   CL 102 06/06/2012 0853   CO2 25 12/04/2012 1346   CO2 26 06/06/2012 0853   BUN 13.9 12/04/2012 1346   BUN 12 06/06/2012 0853   CREATININE 0.7 12/04/2012 1346   CREATININE 0.70 06/06/2012 0853   CREATININE 0.85 04/24/2009 1114      Component Value Date/Time   CALCIUM 8.9 12/04/2012 1346   CALCIUM 9.3 06/06/2012 0853   ALKPHOS 80 12/04/2012 1346   ALKPHOS 35* 06/06/2012 0853   AST 25 12/04/2012 1346   AST 14 06/06/2012 0853   ALT 23 12/04/2012 1346   ALT 9 06/06/2012 0853   BILITOT <0.20 12/04/2012 1346   BILITOT 0.8 06/06/2012 0853     ADDITIONAL INFORMATION: PROGNOSTIC INDICATORS - ACIS Results: IMMUNOHISTOCHEMICAL AND MORPHOMETRIC ANALYSIS BY THE AUTOMATED CELLULAR IMAGING SYSTEM (ACIS) Estrogen Receptor: 0%, NEGATIVE Progesterone Receptor: 0%, NEGATIVE Proliferation Marker Ki67: 95% COMMENT: The negative hormone receptor study(ies) in this case have an internal positive control. REFERENCE RANGE ESTROGEN RECEPTOR NEGATIVE <1% POSITIVE =>1% PROGESTERONE RECEPTOR NEGATIVE <1% POSITIVE =>1% All controls stained appropriately Pecola Leisure MD Pathologist, Electronic Signature ( Signed 06/28/2012) CHROMOGENIC IN-SITU  HYBRIDIZATION Results: HER-2/NEU BY CISH - NO AMPLIFICATION OF HER-2 DETECTED. RESULT RATIO OF HER2: CEP 17 SIGNALS 1.26 1 of 3 Duplicate copy FINAL for SEMAJA, LYMON [id number]) ADDITIONAL INFORMATION:(continued) AVERAGE HER2 COPY NUMBER PER CELL 2.45 REFERENCE RANGE NEGATIVE HER2/Chr17 Ratio <2.0 and Average HER2 copy number <4.0 EQUIVOCAL HER2/Chr17 Ratio <2.0 and Average HER2 copy number 4.0 and <6.0 POSITIVE HER2/Chr17 Ratio >=2.0 and/or Average HER2 copy number >=6.0 Jimmy Picket MD Pathologist, Electronic Signature ( Signed 06/26/2012) FINAL DIAGNOSIS Diagnosis Breast, right, needle core biopsy, 3:00 position - INVASIVE DUCTAL CARCINOMA SEE COMMENT. - DUCTAL CARCINOMA IN SITU Microscopic Comment Although the grade of tumor is best assessed at resection, with these biopsies, both the invasive and in situ carcinoma are grade III. Breast prognostic studies are pending and will be reported in an addendum. The case is reviewed with Dr. Raynald Blend who concurs. (CRR:gt, 06/21/12) [place] RUND DO Pathologist, Electronic FINAL DIAGNOSIS Diagnosis Breast, right, needle core biopsy, mass - BENIGN BREAST TISSUE WITH FOCAL PSEUDOANGIOMATOUS STROMAL HYPERPLASIA (PASH). NO EVIDENCE OF MALIGNANCY. Microscopic Comment Called to the [facility] [place] [facility] on 07/13/12. (JDP:caf 07/13/12)  RADIOGRAPHIC STUDIES:  Dg Chest 2 View  07/04/2012   *RADIOLOGY REPORT*  Clinical Data: Insertion of Port-A-Cath.  Breast cancer. Hypertension.  CHEST - 2 VIEW  Comparison: CT of the chest on 07/03/2012  Findings: Cardiomediastinal silhouette is within normal limits. The lungs are free of focal consolidations and pleural effusions. Bony structures have a normal appearance.  Contrast is identified within the colon following CT of the abdomen.  IMPRESSION: Negative exam.   Original Report Authenticated By: Norva Pavlov, M.D.   Ct Chest W Contrast  07/03/2012   *RADIOLOGY REPORT*  Clinical Data:  Breast  cancer.  No therapy yet.  Lower inner quadrant of right breast.  CT CHEST, ABDOMEN AND PELVIS WITH CONTRAST  Technique: Contiguous axial images of the chest abdomen and pelvis were obtained after IV contrast administration.  Contrast: 80 ml Omnipaque-300  Comparison: Breast MR of 06/26/2012.  No prior CTs.  CT CHEST  Findings: Lung windows demonstrate minimal left lower lobe subpleural nodularity at 3 mm on image 40/series 4.  Right lung clear.  Soft tissue windows demonstrate small bilateral axillary nodes, without adenopathy.  No subpectoral adenopathy. No supraclavicular  adenopathy.  Normal heart size, without pericardial effusion.  No central pulmonary embolism, on this non-dedicated study.  No mediastinal or hilar adenopathy.  No internal mammary adenopathy. Minimal residual thymic tissue in the anterior mediastinum (image 17/series 2).  IMPRESSION:  1. No acute process or evidence of metastatic disease in the chest. 2.  Probable subpleural lymph node in the left lower lobe. Recommend attention on follow-up.  CT ABDOMEN AND PELVIS  Findings:  Mild early phase of hepatic enhancement which decreases sensitivity for focal liver lesions.  None identified.  Normal spleen, stomach, pancreas, gallbladder, biliary tract, adrenal glands, kidneys. No retroperitoneal or retrocrural adenopathy.  Normal colon, appendix, and terminal ileum.  Normal small bowel without abdominal ascites.  No pelvic adenopathy.    Normal urinary bladder and uterus.  No adnexal mass.  Trace free pelvic fluid is likely physiologic.  Transitional right-sided lumbosacral anatomy.  Degenerative disc disease at the lumbosacral junction.  IMPRESSION:  No acute process or evidence of metastatic disease in the abdomen or pelvis.   Original Report Authenticated By: Jeronimo Greaves, M.D.   US Breast Right  06/29/2012   *RADIOLOGY REPORT*  Clinical Data:  52 year old female with newly diagnosed right breast cancer.  Suspicious abnormal enhancement anterior  and superior to the biopsy-proven neoplasm - for second look ultrasound evaluation.  RIGHT BREAST ULTRASOUND  Comparison:  Prior mammograms, ultrasounds and MRI.  Findings: Ultrasound is performed, showing the biopsy-proven neoplasm at the 3 o'clock position of the right breast 4 cm from the nipple. No other discrete sonographic abnormalities are identified, specifically medial, superior, and anterior to the biopsy-proven neoplasm.  IMPRESSION: Patchy abnormal enhancement medial superior to the biopsy-proven neoplasm identified on recent MRI is not visualized sonographically as a discrete abnormality.  MR guided biopsy is recommended if breast conservation surgery is considered.  BI-RADS CATEGORY 6:  Known biopsy-proven malignancy - appropriate action should be taken.  RECOMMENDATION: MR guided right breast biopsy, which will be scheduled by our office.  I have discussed the findings and recommendations with the patient. Results were also provided in writing at the conclusion of the visit.  If applicable, a reminder letter will be sent to the patient regarding the next appointment.   Original Report Authenticated By: Harmon Pier, M.D.   US Breast Right  06/20/2012   *RADIOLOGY REPORT*  Clinical Data:  52 year old female for annual bilateral mammograms and palpable mass in the upper and inner right breast discovered on clinical examination.  DIGITAL DIAGNOSTIC BILATERAL MAMMOGRAM WITH CAD AND RIGHT BREAST ULTRASOUND:  Comparison:  03/21/2009 and prior mammograms dating back to 01/21/2005  Findings:  ACR Breast Density Category 4: The breast tissue is extremely dense.  Routine and spot compression views of the right breast demonstrate no evidence of suspicious mass or distortion. Scattered punctate calcifications within the breasts bilaterally are again identified.  Mammographic images were processed with CAD.  On physical exam, thickening is identified at the 12 o'clock position of the right breast 4 cm from the  nipple. A firm palpable mass identified at the 3 o'clock position of the right breast 4 cm from the nipple.  Ultrasound is performed, showing no evidence of solid or cystic mass, distortion or abnormal areas of shadowing at the 12 o'clock position of the right breast. A 7 x 6 x 17 mm slightly irregular oval hypoechoic mass with calcifications is horizontally oriented at the 3 o'clock position of the right breast 4 cm from the nipple.  IMPRESSION: Indeterminate 7 x 6 x  17 mm hypoechoic mass at the 3 o'clock position of the right breast.  Tissue sampling is recommended.  No mammographic evidence of breast malignancy bilaterally.  No mammographic, palpable or sonographic abnormality at the 12 o'clock position of the right breast, in the area of palpable concern.  BI-RADS CATEGORY 4:  Suspicious abnormality - biopsy should be considered.  RECOMMENDATION:  Ultrasound guided right breast biopsy, which will be performed today but dictated in a separate report.  I have discussed the findings and recommendations with the patient. Results were also provided in writing at the conclusion of the visit.  If applicable, a reminder letter will be sent to the patient regarding the next appointment.   Original Report Authenticated By: Harmon Pier, M.D.   Ct Abdomen Pelvis W Contrast  07/03/2012   *RADIOLOGY REPORT*  Clinical Data:  Breast cancer.  No therapy yet.  Lower inner quadrant of right breast.  CT CHEST, ABDOMEN AND PELVIS WITH CONTRAST  Technique: Contiguous axial images of the chest abdomen and pelvis were obtained after IV contrast administration.  Contrast: 80 ml Omnipaque-300  Comparison: Breast MR of 06/26/2012.  No prior CTs.  CT CHEST  Findings: Lung windows demonstrate minimal left lower lobe subpleural nodularity at 3 mm on image 40/series 4.  Right lung clear.  Soft tissue windows demonstrate small bilateral axillary nodes, without adenopathy.  No subpectoral adenopathy. No supraclavicular adenopathy.  Normal  heart size, without pericardial effusion.  No central pulmonary embolism, on this non-dedicated study.  No mediastinal or hilar adenopathy.  No internal mammary adenopathy. Minimal residual thymic tissue in the anterior mediastinum (image 17/series 2).  IMPRESSION:  1. No acute process or evidence of metastatic disease in the chest. 2.  Probable subpleural lymph node in the left lower lobe. Recommend attention on follow-up.  CT ABDOMEN AND PELVIS  Findings:  Mild early phase of hepatic enhancement which decreases sensitivity for focal liver lesions.  None identified.  Normal spleen, stomach, pancreas, gallbladder, biliary tract, adrenal glands, kidneys. No retroperitoneal or retrocrural adenopathy.  Normal colon, appendix, and terminal ileum.  Normal small bowel without abdominal ascites.  No pelvic adenopathy.    Normal urinary bladder and uterus.  No adnexal mass.  Trace free pelvic fluid is likely physiologic.  Transitional right-sided lumbosacral anatomy.  Degenerative disc disease at the lumbosacral junction.  IMPRESSION:  No acute process or evidence of metastatic disease in the abdomen or pelvis.   Original Report Authenticated By: Jeronimo Greaves, M.D.   Mr Breast Right W Wo Contrast  07/12/2012   *RADIOLOGY REPORT*  Clinical Data:  Known invasive mammary carcinoma in the right breast at 3 o'clock posteriorly.  Additional site of abnormal enhancement in the right upper inner quadrant closer to the nipple for possible biopsy.  ATTEMPTED MRI GUIDED VACUUM ASSISTED BIOPSY OF THE RIGHT BREAST WITHOUT AND WITH CONTRAST  Comparison: Previous exams.  Technique: Multiplanar, multisequence MR images of the right breast were obtained prior to and following the intravenous administration of 9 ml of Mulithance.  I met with the patient, and we discussed the procedure of MRI guided biopsy, including risks, benefits, and alternatives. Specifically, we discussed the risks of infection, bleeding, tissue injury, clip  migration, and inadequate sampling.  Informed, written consent was given.  Using sterile technique, 2% Lidocaine, MRI guidance, and a 9 gauge vacuum assisted device, biopsy was attempted using a mediolateral approach.  However, the procedure was unsuccessful due to the extremely thin breast compression.  IMPRESSION:  Unsuccessful MRI guided  vacuum-assisted biopsy of the right breast.  Patient was sent to the [facility] for further evaluation with ultrasound.   Original Report Authenticated By: Cain Saupe, M.D.   Mr Breast Bilateral W Wo Contrast  06/26/2012   *RADIOLOGY REPORT*  Clinical Data: History of new diagnosis of breast cancer from recent ultrasound guided core needle biopsy right breast 06/20/2012 demonstrating invasive ductal carcinoma with DCIS.  GFR 89, creatinine 0.7 on 06/06/2012.  BILATERAL BREAST MRI WITH AND WITHOUT CONTRAST  Technique: Multiplanar, multisequence MR images of both breasts were obtained prior to and following the intravenous administration of 10ml of multihance.  Three dimensional images were evaluated at the independent DynaCad workstation.  Comparison:  Recent mammograms and ultrasound.  Findings: Examination demonstrates mild background parenchymal enhancement.  Right breast:  There is evidence of an ovoid gently lobulated and somewhat ill-defined heterogeneously enhancing mass over the deep third of the inner midportion of the right breast representing patient's biopsy-proven malignancy.  This mass contains central clip artifact and measures approximately 0.9 x 1.9 x 1.0 cm. The anterior border of this mass is located 5.5 cm from the nipple. There is a 6 mm slightly ill-defined enhancing ovoid mass 6 mm posterior lateral to the biopsy-proven malignancy lying just superficial to the pectoralis muscle.  There is also clumped non mass enhancement anterior and slightly superior to the biopsy- proven malignancy in the middle third at approximately the 2 o'clock position.   This measures approximately 0.8 x 1.6 x 1.2 cm and is located 3 cm from the nipple.  This is suspicious for multifocal disease.  No suspicious axillary or internal mammary lymph nodes.  Left breast:  No suspicious mass, enhancement or adenopathy.  IMPRESSION: Known biopsy-proven malignancy over the deep third of the inner mid right breast.  6 mm ill-defined enhancing ovoid mass 6 mm posterior lateral to the biopsy-proven malignancy and lying just superficial to the pectoralis muscle.  Clumped non mass enhancement anterior and superior to patient's known malignancy at approximately the 2 o'clock position measuring 0.8 x 1.6 x 2.2 cm.  Findings likely represent multi focal disease.  RECOMMENDATION: Recommend second look ultrasound and core biopsy if feasible of the clumped non mass enhancement at approximately the 2 o'clock position right breast anterior/superior to the known malignancy. If not seen by ultrasound, recommend proceeding to MR guided biopsy of this suspicious abnormality.  THREE-DIMENSIONAL MR IMAGE RENDERING ON INDEPENDENT WORKSTATION:  Three-dimensional MR images were rendered by post-processing of the original MR data on an independent workstation.  The three- dimensional MR images were interpreted, and findings were reported in the accompanying complete MRI report for this study.  BI-RADS CATEGORY 0:  Incomplete.  Need additional imaging evaluation and/or prior mammograms for comparison.   Original Report Authenticated By: Elberta Fortis, M.D.   Dg Chest Port 1 View  07/06/2012   *RADIOLOGY REPORT*  Clinical Data: Post left-sided port catheter insertion  PORTABLE CHEST - 1 VIEW  Comparison: 07/04/2012  Findings:  Grossly unchanged cardiac silhouette and mediastinal contours. Interval placement of a left anterior chest wall subclavian vein approach port-a-catheter with tip projecting over the superior cavoatrial junction. Grossly unchanged symmetric biapical pleural parenchymal thickening.  No  pneumothorax.  No focal airspace opacities.  No pleural effusion.  No evidence of edema.  Unchanged bones.  IMPRESSION: Interval placement of a left anterior chest wall subclavian vein approach port-a-catheter with tip overlying the superior cavoatrial junction.  No pneumothorax.   Original Report Authenticated By: Tacey Ruiz, MD  Mm Digital Diagnostic Bilat  06/20/2012   *RADIOLOGY REPORT*  Clinical Data:  52 year old female for annual bilateral mammograms and palpable mass in the upper and inner right breast discovered on clinical examination.  DIGITAL DIAGNOSTIC BILATERAL MAMMOGRAM WITH CAD AND RIGHT BREAST ULTRASOUND:  Comparison:  03/21/2009 and prior mammograms dating back to 01/21/2005  Findings:  ACR Breast Density Category 4: The breast tissue is extremely dense.  Routine and spot compression views of the right breast demonstrate no evidence of suspicious mass or distortion. Scattered punctate calcifications within the breasts bilaterally are again identified.  Mammographic images were processed with CAD.  On physical exam, thickening is identified at the 12 o'clock position of the right breast 4 cm from the nipple. A firm palpable mass identified at the 3 o'clock position of the right breast 4 cm from the nipple.  Ultrasound is performed, showing no evidence of solid or cystic mass, distortion or abnormal areas of shadowing at the 12 o'clock position of the right breast. A 7 x 6 x 17 mm slightly irregular oval hypoechoic mass with calcifications is horizontally oriented at the 3 o'clock position of the right breast 4 cm from the nipple.  IMPRESSION: Indeterminate 7 x 6 x 17 mm hypoechoic mass at the 3 o'clock position of the right breast.  Tissue sampling is recommended.  No mammographic evidence of breast malignancy bilaterally.  No mammographic, palpable or sonographic abnormality at the 12 o'clock position of the right breast, in the area of palpable concern.  BI-RADS CATEGORY 4:  Suspicious  abnormality - biopsy should be considered.  RECOMMENDATION:  Ultrasound guided right breast biopsy, which will be performed today but dictated in a separate report.  I have discussed the findings and recommendations with the patient. Results were also provided in writing at the conclusion of the visit.  If applicable, a reminder letter will be sent to the patient regarding the next appointment.   Original Report Authenticated By: Harmon Pier, M.D.   Mm Digital Diagnostic Unilat R  07/12/2012   *RADIOLOGY REPORT*  Clinical Data:  Ultrasound-guided core needle biopsy of a hypoechoic area at 2 o'clock 2 cm from the right nipple with clip placement.  Known invasive mammary carcinoma in the right breast. Unsuccessful right breast MRI guided biopsy earlier today.  DIGITAL DIAGNOSTIC RIGHT MAMMOGRAM  Comparison:  Previous exams.  Findings:  Films are performed following ultrasound guided biopsy of a small hypoechoic area at 2 o'clock 2 cm from the right nipple. The ribbon clip is positioned within the area of concern. The ribbon clip is approximately 2.6 cm anterior to the top hat clip from the original biopsy on 06/20/2012.  IMPRESSION: Appropriate clip placement following ultrasound-guided core needle biopsy of a hypoechoic area at 2 o'clock 2 cm in the right nipple.   Original Report Authenticated By: Cain Saupe, M.D.   Mm Digital Diagnostic Unilat R  06/20/2012   *RADIOLOGY REPORT*  Clinical Data:  52 year old female - evaluate clip placement following ultrasound guided right breast biopsy.  DIGITAL DIAGNOSTIC RIGHT MAMMOGRAM  Comparison:  Previous exams.  Findings:  Films are performed following ultrasound guided biopsy of the 7 x 6 x 17 mm hypoechoic mass at the 3 o'clock position of the right breast 4 cm from the nipple. The T shaped biopsy clip is in satisfactory position.  IMPRESSION: Satisfactory clip placement following ultrasound guided right breast biopsy.  Pathology will be followed.   Original  Report Authenticated By: Harmon Pier, M.D.   Dg Fluoro Guide  Cv Line-no Report  07/06/2012   CLINICAL DATA: port placement   FLOURO GUIDE CV LINE  Fluoroscopy was utilized by the requesting physician.  No radiographic  interpretation.    [place] Rt Breast Bx W Loc Dev 1st Lesion Img Bx Spec US Guide  07/13/2012   **ADDENDUM** CREATED: 07/13/2012 14:11:45  I spoke with the patient by telephone on 07/13/2012 to discuss pathology results.  Pathology demonstrates  pseudoangiomatous stromal hyperplasia.  The findings were called to the office of Dr. Donell Beers. Although the imaging findings could be related to pseudoangiomatous stromal hyperplasia, excision of the anterior biopsy site is suggested at the time of patient's definitive surgery.  The patient is considering unilateral or bilateral mastectomy at this time.  The patient reports no problems at the biopsy site.  All questions were answered.  Recommendations:  Excision of the biopsy site. The patient is scheduled to begin chemotherapy in 4 days.  **END ADDENDUM** SIGNED BY: Blair Hailey. Manson Passey, M.D.  07/12/2012   *RADIOLOGY REPORT*  Clinical Data:  Unsuccessful MRI guided biopsy of the area of abnormal enhancement in the left anterior upper inner quadrant, anterior to the known invasive mammary carcinoma.  Second look ultrasound performed and a 5 x 4 x 4 mm hypoechoic area found at 2 o'clock 2 cm from the right nipple which will be biopsied.  ULTRASOUND GUIDED VACUUM ASSISTED CORE BIOPSY OF THE RIGHT BREAST  Sonography of the right upper inner quadrant was performed.  There is an ill-defined hypoechoic 5 x 4 x 4 mm area at 2 o'clock 2 cm from the right nipple which will be biopsied with ultrasound guidance.  The patient and I discussed the procedure of ultrasound-guided biopsy, including benefits and alternatives.  We discussed the high likelihood of a successful procedure. We discussed the risks of the procedure including infection, bleeding, tissue injury, clip  migration, and inadequate sampling.  Written informed consent was given.  Using sterile technique, 2% lidocaine, ultrasound guidance, and a 12 gauge vacuum assisted needle, biopsy was performed of the hypoechoic area at 2 o'clock 2 cm from the right nipple using a caudocranial approach.  At the conclusion of the procedure, a within tissue marker clip was deployed into the biopsy cavity. Follow-up 2-view mammogram was performed and dictated separately.  IMPRESSION: Ultrasound-guided biopsy of a 5 x 4 x 4 mm hypoechoic area at 2 o'clock 2 cm from the right nipple.  No apparent complications.   Original Report Authenticated By: Cain Saupe, M.D.   [place] Rt Breast Bx W Loc Dev 1st Lesion Img Bx Spec US Guide  06/21/2012   *RADIOLOGY REPORT*  Clinical Data:  Indeterminate 7 x 6 x 17 mm hypoechoic mass in the inner right breast - for tissue sampling.  ULTRASOUND GUIDED VACUUM ASSISTED CORE BIOPSY OF THE RIGHT BREAST  Comparison: Previous exams.  I met with the patient and we discussed the procedure of ultrasound- guided biopsy, including benefits and alternatives.  We discussed the high likelihood of a successful procedure. We discussed the risks of the procedure including infection, bleeding, tissue injury, clip migration, and inadequate sampling.  Informed written consent was given.  Using sterile technique, 2% lidocaine ultrasound guidance and a 12 gauge vacuum assisted needle biopsy was performed of the 7 x 6 x 17 mm hypoechoic mass at the 3 o'clock position of the right breast 4 cm from the nipple using a lateral approach.  At the conclusion of the procedure, a T shaped tissue marker clip was deployed into the  biopsy cavity.  Follow-up 2-view mammogram was performed and dictated separately.  IMPRESSION: Ultrasound-guided biopsy of right breast mass.  No apparent complications.  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 Histology correlates with imaging findings.  The patient was contacted  by phone on 06/21/2012 and these results given to her which she understood. Her questions were answered. She was encouraged to obtain breast cancer educational material from [facility] at her convenience. The patient had no complaints with her biopsy site.  Recommend surgery/oncology consultation.  An appointment at [facility] has been scheduled for 06/28/2012. Recommend bilateral breast MRI, which has been scheduled for 06/26/2012. The patient was informed of these appointments.   Original Report Authenticated By: Harmon Pier, M.D.    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 Patient had other areas seen on MRI biopsy of the second mass was negative. Patient has had her Port-A-Cath placed. She is proceeding neoadjuvant chemotherapy to possibly conserve her breast. Chemotherapy consisted of Adriamycin Cytoxan given dose dense for 4 cycles starting on 07/17/2012 with Neulasta support.  This was followed by Taxol/Carbo which started on 09/11/12.  She received 7 cycles of this and it was discontinued early due to neuropathy.  She will now receive Gemzar/carbo beginning 11/06/12.    #2 we discussed risks benefits and side effects of chemotherapy. She has had an echocardiogram as well as chemotherapy teaching class. Port-A-Cath was placed by Dr. Donell Beers. The port is functioning well.  #3 Gemzar/Carbo starting 11/06/12  She will receive 3 cycles.    #4 vaginal herpes outbreak  #5 neuropathy  PLAN:  #1  Doing well.  Patient will proceed with chemotherapy today.  I reviewed her labs with her in detail.    #2 She will return tomorrow for neulasta and in one week for labs and evaluation.    #3 She will continue Gabapentin for her neuropathy, it is stable.   All questions were answered. The patient knows to call the clinic with any problems, questions or concerns. We can certainly see the patient  much sooner if necessary.  I spent 25 minutes counseling the patient face to face. The total time spent in the appointment was 30 minutes.  Illa Level, NP Medical Oncology [facility] [phone] 12/06/2012, 12:36 PM  ATTENDING'S ATTESTATION:  I personally reviewed patient's chart, examined patient myself, formulated the treatment plan as followed.    Doing well, tolerating chemotherapy very nicely. She will proceed with her chemotherapy today. She does have neuropathy she is on gabapentin. This is helping her.  Drue Second, MD Medical/Oncology [facility] [phone] (beeper) [phone] (Office)  12/25/2012, 3:16 AM

## 2012-12-04 NOTE — Progress Notes (Signed)
1740--Left chest PAC negative for blood return, will recheck before discharge.

## 2012-12-04 NOTE — Telephone Encounter (Signed)
, °

## 2012-12-04 NOTE — Addendum Note (Signed)
Addended by: Corky Sing on: 12/04/2012 04:08 PM   Modules accepted: Orders

## 2012-12-05 ENCOUNTER — Telehealth: Payer: Self-pay | Admitting: *Deleted

## 2012-12-05 ENCOUNTER — Ambulatory Visit: Payer: Medicaid Other

## 2012-12-05 ENCOUNTER — Ambulatory Visit (HOSPITAL_BASED_OUTPATIENT_CLINIC_OR_DEPARTMENT_OTHER): Payer: Medicaid Other

## 2012-12-05 ENCOUNTER — Telehealth: Payer: Self-pay | Admitting: Oncology

## 2012-12-05 VITALS — BP 123/88 | HR 88 | Temp 97.1°F

## 2012-12-05 DIAGNOSIS — Z5189 Encounter for other specified aftercare: Secondary | ICD-10-CM

## 2012-12-05 DIAGNOSIS — C50311 Malignant neoplasm of lower-inner quadrant of right female breast: Secondary | ICD-10-CM

## 2012-12-05 DIAGNOSIS — C50319 Malignant neoplasm of lower-inner quadrant of unspecified female breast: Secondary | ICD-10-CM

## 2012-12-05 MED ORDER — PEGFILGRASTIM INJECTION 6 MG/0.6ML
6.0000 mg | Freq: Once | SUBCUTANEOUS | Status: AC
  Administered 2012-12-05: 6 mg via SUBCUTANEOUS
  Filled 2012-12-05: qty 0.6

## 2012-12-05 NOTE — Telephone Encounter (Signed)
Patient called and needs to move her injection appt from this morning to this afternoon. She will call back with a time

## 2012-12-08 ENCOUNTER — Other Ambulatory Visit (INDEPENDENT_AMBULATORY_CARE_PROVIDER_SITE_OTHER): Payer: Self-pay | Admitting: General Surgery

## 2012-12-08 ENCOUNTER — Telehealth (INDEPENDENT_AMBULATORY_CARE_PROVIDER_SITE_OTHER): Payer: Self-pay

## 2012-12-08 DIAGNOSIS — C50911 Malignant neoplasm of unspecified site of right female breast: Secondary | ICD-10-CM

## 2012-12-08 NOTE — Telephone Encounter (Signed)
Patient is ready to go forward with bilateral masty . Offered to make a pre-op appointment per Dr. Jenene Slicker' note , patient states she does not want a pre op appointment she wants Dr. Donell Beers to call her . Please advised

## 2012-12-08 NOTE — Telephone Encounter (Signed)
Called pt to inform her that orders had been written and sent to surgery scheduling.  I asked if she would like surgery before or after the holidays.  She has requested early February.  I told her the message would be passed on to the schedulers.

## 2012-12-12 ENCOUNTER — Other Ambulatory Visit (INDEPENDENT_AMBULATORY_CARE_PROVIDER_SITE_OTHER): Payer: Self-pay | Admitting: General Surgery

## 2012-12-12 ENCOUNTER — Ambulatory Visit (HOSPITAL_BASED_OUTPATIENT_CLINIC_OR_DEPARTMENT_OTHER): Payer: Medicaid Other | Admitting: Adult Health

## 2012-12-12 ENCOUNTER — Telehealth: Payer: Self-pay | Admitting: *Deleted

## 2012-12-12 ENCOUNTER — Encounter: Payer: Self-pay | Admitting: Adult Health

## 2012-12-12 ENCOUNTER — Other Ambulatory Visit (HOSPITAL_BASED_OUTPATIENT_CLINIC_OR_DEPARTMENT_OTHER): Payer: Medicaid Other | Admitting: Lab

## 2012-12-12 VITALS — BP 119/74 | HR 80 | Temp 97.6°F | Resp 20 | Ht 63.0 in | Wt 129.4 lb

## 2012-12-12 DIAGNOSIS — Z171 Estrogen receptor negative status [ER-]: Secondary | ICD-10-CM

## 2012-12-12 DIAGNOSIS — B1089 Other human herpesvirus infection: Secondary | ICD-10-CM

## 2012-12-12 DIAGNOSIS — C50919 Malignant neoplasm of unspecified site of unspecified female breast: Secondary | ICD-10-CM

## 2012-12-12 DIAGNOSIS — C50311 Malignant neoplasm of lower-inner quadrant of right female breast: Secondary | ICD-10-CM

## 2012-12-12 DIAGNOSIS — G609 Hereditary and idiopathic neuropathy, unspecified: Secondary | ICD-10-CM

## 2012-12-12 LAB — CBC WITH DIFFERENTIAL/PLATELET
Basophils Absolute: 0.1 10*3/uL (ref 0.0–0.1)
EOS%: 0 % (ref 0.0–7.0)
HGB: 9.9 g/dL — ABNORMAL LOW (ref 11.6–15.9)
MCH: 33.2 pg (ref 25.1–34.0)
MONO%: 6.1 % (ref 0.0–14.0)
NEUT#: 17.4 10*3/uL — ABNORMAL HIGH (ref 1.5–6.5)
RBC: 2.98 10*6/uL — ABNORMAL LOW (ref 3.70–5.45)
RDW: 19.8 % — ABNORMAL HIGH (ref 11.2–14.5)
lymph#: 1.1 10*3/uL (ref 0.9–3.3)

## 2012-12-12 LAB — COMPREHENSIVE METABOLIC PANEL (CC13)
ALT: 16 U/L (ref 0–55)
AST: 18 U/L (ref 5–34)
Albumin: 3.7 g/dL (ref 3.5–5.0)
Alkaline Phosphatase: 203 U/L — ABNORMAL HIGH (ref 40–150)
Anion Gap: 9 mEq/L (ref 3–11)
BUN: 14.2 mg/dL (ref 7.0–26.0)
CO2: 27 mEq/L (ref 22–29)
Calcium: 9.7 mg/dL (ref 8.4–10.4)
Chloride: 102 mEq/L (ref 98–109)
Potassium: 3.6 mEq/L (ref 3.5–5.1)
Sodium: 138 mEq/L (ref 136–145)
Total Protein: 7 g/dL (ref 6.4–8.3)

## 2012-12-12 NOTE — Telephone Encounter (Signed)
appts made and printed...td 

## 2012-12-12 NOTE — Progress Notes (Signed)
OFFICE PROGRESS NOTE  CC  [name], MD [address] [place] [address] Dr. Everardo Beals Dr. 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 Patient had a palpable mass at the 3:00 position. She was seen at the breast Center on 06/20/2012 she had a mammogram and ultrasound showing a 1.7 cm mass at the 3:00 position. 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 Tumor was grade 3.  #2 MRIs performed on 06/26/2012 showed no biopsy mass at 3:00 but also 2 other adjacent masses at 1 at 2:00 measuring 0.8 x 1.6 x 2.2 cm andalso 6 mm ill-defined mass posterior to the biopsy-proven malignancy.  #3 patient has underwent Port-A-Cath placement in order to begin neoadjuvant chemotherapy initially consisting of Adriamycin Cytoxan to be given dose dense for 4 cycles.  She underwent this from 07/17/12 through 09/04/12 and it was followed by weekly Taxol and carboplatinum for a total of 12 weeks starting on 09/11/12.  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  She will receive Gemzar Carbo starting 11/06/12.  The plan is for her to receive 3 cycles.    CURRENT THERAPY: Gemzar Carbo cycle 3 day 8  INTERVAL HISTORY: Jaime Murillo 52 y.o. female returns for evaluation following her third and final cycle of Gemzar carbo. She's doing well today.  She denies fevers, chills, nausea, vomiting, constipation, diarrhea.  Her numbness is stable.  She deals with it mostly at night.  During this time she takes Gabapentin 500mg  qHS. She did have difficulty with her final chemotherapy treatment.  Her port was unable to be flushed or receive blood  return, therefore she received the chemotherapy peripherally.  She would like her port out as soon as possible due to this. Otherwise, a 10 point ROS is negative.   MEDICAL HISTORY: Past Medical History  Diagnosis Date  . HSV infection   . NSVD (normal spontaneous vaginal delivery)   . Hypertension     takes HCTZ  . Urinary frequency   . Anemia     takes Ferrous Sulfate occasionally  . Breast cancer 2014    triple negative    ALLERGIES:  has No Known Allergies.  MEDICATIONS:  Current Outpatient Prescriptions  Medication Sig Dispense Refill  . Alum & Mag Hydroxide-Simeth (MAGIC MOUTHWASH W/LIDOCAINE) SOLN Take 5 mL by mouth 4 (four) times daily. Swish and spit  500 mL  0  . dexamethasone (DECADRON) 4 MG tablet Take 1 tablet (4 mg total) by mouth 2 (two) times daily with a meal. Take 2 tabs every day on the day after chemo an then take 2 tabs twice a day with food for 2 days.  60 tablet  0  . dexamethasone (DECADRON) 4 MG tablet Take 2 tablets (8 mg total) by mouth 2 (two) times daily with a meal. Take daily starting the day after chemotherapy for 2 days. Take with food.  30 tablet  1  . ferrous sulfate 325 (65 FE) MG tablet Take 325 mg by mouth daily with breakfast.      . gabapentin (NEURONTIN) 100 MG capsule Take 3 tablets (300mg s total) twice a day and take 4 tablets (400mg s total) at bedtime.  300 capsule  2  . hydrochlorothiazide (HYDRODIURIL) 25 MG tablet Take 1 tablet (25 mg total) by mouth daily.  30 tablet  11  . lidocaine-prilocaine (EMLA) cream Apply 1 application topically as needed (apply to PAC site 1-2 hours prior to treatment.).      Marland Kitchen LORazepam (ATIVAN) 0.5 MG tablet Take 1 tablet (0.5 mg total) by mouth every 6 (six) hours as needed (Nausea or vomiting).  30 tablet  0  . ondansetron (ZOFRAN) 8 MG tablet Take 1 tablet (8 mg total) by mouth every 8 (eight) hours as needed for nausea (Take 1 pill by mouth twice a day as needed  for nausea and vomitting starting 3rd day  after chemo).  20 tablet  3  . ondansetron (ZOFRAN) 8 MG tablet Take 1 tablet (8 mg total) by mouth 2 (two) times daily. Take two times a day starting the day after chemo for 2 days. Then take two times a day as needed for nausea or vomiting.  30 tablet  1  . oxyCODONE-acetaminophen (ROXICET) 5-325 MG per tablet Take 1-2 tablets by mouth every 4 (four) hours as needed for pain.  30 tablet  0  . prochlorperazine (COMPAZINE) 10 MG tablet Take 1 tablet (10 mg total) by mouth every 6 (six) hours as needed (Nausea or vomiting).  30 tablet  1  . prochlorperazine (COMPAZINE) 25 MG suppository Place 25 mg rectally every 12 (twelve) hours as needed for nausea (as needed for nausea and vomitting).      . valACYclovir (VALTREX) 500 MG tablet Take 1 tablet (500 mg total) by mouth daily as needed. For herpes  30 tablet  2   No current facility-administered medications for this visit.   Facility-Administered Medications Ordered in Other Visits  Medication Dose Route Frequency Provider Last Rate Last Dose  . sodium chloride 0.9 % injection 10 mL  10 mL Intracatheter PRN Illa Level, NP   10 mL at 12/04/12 1812    SURGICAL HISTORY:  Past Surgical History  Procedure Laterality Date  . Tubal ligation  2003    LTL  . Portacath placement N/A 07/06/2012    Procedure: INSERTION PORT-A-CATH;  Surgeon: Almond Lint, MD;  Location: [facility] OR;  Service: General;  Laterality: N/A;    REVIEW OF SYSTEMS: A 10 point review of systems was conducted and is otherwise negative except for what is noted above.     PHYSICAL EXAMINATION: Blood pressure 119/74, pulse 80, temperature 97.6 F (36.4 C), temperature source Oral, resp. rate 20, height 5\' 3"  (1.6 m), weight 129 lb 6.4 oz (58.695 kg), last menstrual period 06/07/2012. Body mass index is 22.93 kg/(m^2). General: Patient is a well appearing female in no acute distress HEENT: PERRLA, sclerae anicteric no conjunctival pallor, MMM Neck: supple, no palpable  adenopathy Lungs: clear to auscultation bilaterally, no wheezes, rhonchi, or rales Cardiovascular: regular rate rhythm, S1, S2, no murmurs, rubs or gallops Abdomen: Soft, non-tender, non-distended, normoactive bowel sounds, no HSM Extremities: warm and well perfused, no clubbing, cyanosis, or edema Skin: No rashes or lesions Neuro: Non-focal Breast examination: Left breast no masses or nipple discharge Right breast without palpable abnormality ECOG PERFORMANCE STATUS: 0 - Asymptomatic  LABORATORY DATA: Lab Results  Component Value Date   WBC 19.7* 12/12/2012   HGB 9.9* 12/12/2012   HCT 29.5* 12/12/2012   MCV 98.8 12/12/2012   PLT 209 12/12/2012      Chemistry      Component Value Date/Time   NA 138 12/12/2012  0915   NA 137 06/06/2012 0853   K 3.6 12/12/2012 0915   K 3.8 06/06/2012 0853   CL 104 07/17/2012 1357   CL 102 06/06/2012 0853   CO2 27 12/12/2012 0915   CO2 26 06/06/2012 0853   BUN 14.2 12/12/2012 0915   BUN 12 06/06/2012 0853   CREATININE 0.7 12/12/2012 0915   CREATININE 0.70 06/06/2012 0853   CREATININE 0.85 04/24/2009 1114      Component Value Date/Time   CALCIUM 9.7 12/12/2012 0915   CALCIUM 9.3 06/06/2012 0853   ALKPHOS 203* 12/12/2012 0915   ALKPHOS 35* 06/06/2012 0853   AST 18 12/12/2012 0915   AST 14 06/06/2012 0853   ALT 16 12/12/2012 0915   ALT 9 06/06/2012 0853   BILITOT <0.20 12/12/2012 0915   BILITOT 0.8 06/06/2012 0853     ADDITIONAL INFORMATION: PROGNOSTIC INDICATORS - ACIS Results: IMMUNOHISTOCHEMICAL AND MORPHOMETRIC ANALYSIS BY THE AUTOMATED CELLULAR IMAGING SYSTEM (ACIS) Estrogen Receptor: 0%, NEGATIVE Progesterone Receptor: 0%, NEGATIVE Proliferation Marker Ki67: 95% COMMENT: The negative hormone receptor study(ies) in this case have an internal positive control. REFERENCE RANGE ESTROGEN RECEPTOR NEGATIVE <1% POSITIVE =>1% PROGESTERONE RECEPTOR NEGATIVE <1% POSITIVE =>1% All controls stained appropriately Pecola Leisure MD Pathologist,  Electronic Signature ( Signed 06/28/2012) CHROMOGENIC IN-SITU HYBRIDIZATION Results: HER-2/NEU BY CISH - NO AMPLIFICATION OF HER-2 DETECTED. RESULT RATIO OF HER2: CEP 17 SIGNALS 1.26 1 of 3 Duplicate copy FINAL for ASIANA, BENNINGER [id number]) ADDITIONAL INFORMATION:(continued) AVERAGE HER2 COPY NUMBER PER CELL 2.45 REFERENCE RANGE NEGATIVE HER2/Chr17 Ratio <2.0 and Average HER2 copy number <4.0 EQUIVOCAL HER2/Chr17 Ratio <2.0 and Average HER2 copy number 4.0 and <6.0 POSITIVE HER2/Chr17 Ratio >=2.0 and/or Average HER2 copy number >=6.0 Jimmy Picket MD Pathologist, Electronic Signature ( Signed 06/26/2012) FINAL DIAGNOSIS Diagnosis Breast, right, needle core biopsy, 3:00 position - INVASIVE DUCTAL CARCINOMA SEE COMMENT. - DUCTAL CARCINOMA IN SITU Microscopic Comment Although the grade of tumor is best assessed at resection, with these biopsies, both the invasive and in situ carcinoma are grade III. Breast prognostic studies are pending and will be reported in an addendum. The case is reviewed with Dr. Raynald Blend who concurs. (CRR:gt, 06/21/12) [place] RUND DO Pathologist, Electronic FINAL DIAGNOSIS Diagnosis Breast, right, needle core biopsy, mass - BENIGN BREAST TISSUE WITH FOCAL PSEUDOANGIOMATOUS STROMAL HYPERPLASIA (PASH). NO EVIDENCE OF MALIGNANCY. Microscopic Comment Called to [facility] on 07/13/12. (JDP:caf 07/13/12)  RADIOGRAPHIC STUDIES:  Dg Chest 2 View  07/04/2012   *RADIOLOGY REPORT*  Clinical Data: Insertion of Port-A-Cath.  Breast cancer. Hypertension.  CHEST - 2 VIEW  Comparison: CT of the chest on 07/03/2012  Findings: Cardiomediastinal silhouette is within normal limits. The lungs are free of focal consolidations and pleural effusions. Bony structures have a normal appearance.  Contrast is identified within the colon following CT of the abdomen.  IMPRESSION: Negative exam.   Original Report Authenticated By: Norva Pavlov, M.D.   Ct Chest W  Contrast  07/03/2012   *RADIOLOGY REPORT*  Clinical Data:  Breast cancer.  No therapy yet.  Lower inner quadrant of right breast.  CT CHEST, ABDOMEN AND PELVIS WITH CONTRAST  Technique: Contiguous axial images of the chest abdomen and pelvis were obtained after IV contrast administration.  Contrast: 80 ml Omnipaque-300  Comparison: Breast MR of 06/26/2012.  No prior CTs.  CT CHEST  Findings: Lung windows demonstrate minimal left lower lobe subpleural nodularity at 3 mm on image 40/series 4.  Right lung clear.  Soft tissue windows demonstrate small bilateral axillary  nodes, without adenopathy.  No subpectoral adenopathy. No supraclavicular adenopathy.  Normal heart size, without pericardial effusion.  No central pulmonary embolism, on this non-dedicated study.  No mediastinal or hilar adenopathy.  No internal mammary adenopathy. Minimal residual thymic tissue in the anterior mediastinum (image 17/series 2).  IMPRESSION:  1. No acute process or evidence of metastatic disease in the chest. 2.  Probable subpleural lymph node in the left lower lobe. Recommend attention on follow-up.  CT ABDOMEN AND PELVIS  Findings:  Mild early phase of hepatic enhancement which decreases sensitivity for focal liver lesions.  None identified.  Normal spleen, stomach, pancreas, gallbladder, biliary tract, adrenal glands, kidneys. No retroperitoneal or retrocrural adenopathy.  Normal colon, appendix, and terminal ileum.  Normal small bowel without abdominal ascites.  No pelvic adenopathy.    Normal urinary bladder and uterus.  No adnexal mass.  Trace free pelvic fluid is likely physiologic.  Transitional right-sided lumbosacral anatomy.  Degenerative disc disease at the lumbosacral junction.  IMPRESSION:  No acute process or evidence of metastatic disease in the abdomen or pelvis.   Original Report Authenticated By: Jeronimo Greaves, M.D.   US Breast Right  06/29/2012   *RADIOLOGY REPORT*  Clinical Data:  52 year old female with newly  diagnosed right breast cancer.  Suspicious abnormal enhancement anterior and superior to the biopsy-proven neoplasm - for second look ultrasound evaluation.  RIGHT BREAST ULTRASOUND  Comparison:  Prior mammograms, ultrasounds and MRI.  Findings: Ultrasound is performed, showing the biopsy-proven neoplasm at the 3 o'clock position of the right breast 4 cm from the nipple. No other discrete sonographic abnormalities are identified, specifically medial, superior, and anterior to the biopsy-proven neoplasm.  IMPRESSION: Patchy abnormal enhancement medial superior to the biopsy-proven neoplasm identified on recent MRI is not visualized sonographically as a discrete abnormality.  MR guided biopsy is recommended if breast conservation surgery is considered.  BI-RADS CATEGORY 6:  Known biopsy-proven malignancy - appropriate action should be taken.  RECOMMENDATION: MR guided right breast biopsy, which will be scheduled by our office.  I have discussed the findings and recommendations with the patient. Results were also provided in writing at the conclusion of the visit.  If applicable, a reminder letter will be sent to the patient regarding the next appointment.   Original Report Authenticated By: Harmon Pier, M.D.   US Breast Right  06/20/2012   *RADIOLOGY REPORT*  Clinical Data:  52 year old female for annual bilateral mammograms and palpable mass in the upper and inner right breast discovered on clinical examination.  DIGITAL DIAGNOSTIC BILATERAL MAMMOGRAM WITH CAD AND RIGHT BREAST ULTRASOUND:  Comparison:  03/21/2009 and prior mammograms dating back to 01/21/2005  Findings:  ACR Breast Density Category 4: The breast tissue is extremely dense.  Routine and spot compression views of the right breast demonstrate no evidence of suspicious mass or distortion. Scattered punctate calcifications within the breasts bilaterally are again identified.  Mammographic images were processed with CAD.  On physical exam, thickening is  identified at the 12 o'clock position of the right breast 4 cm from the nipple. A firm palpable mass identified at the 3 o'clock position of the right breast 4 cm from the nipple.  Ultrasound is performed, showing no evidence of solid or cystic mass, distortion or abnormal areas of shadowing at the 12 o'clock position of the right breast. A 7 x 6 x 17 mm slightly irregular oval hypoechoic mass with calcifications is horizontally oriented at the 3 o'clock position of the right breast 4 cm from  the nipple.  IMPRESSION: Indeterminate 7 x 6 x 17 mm hypoechoic mass at the 3 o'clock position of the right breast.  Tissue sampling is recommended.  No mammographic evidence of breast malignancy bilaterally.  No mammographic, palpable or sonographic abnormality at the 12 o'clock position of the right breast, in the area of palpable concern.  BI-RADS CATEGORY 4:  Suspicious abnormality - biopsy should be considered.  RECOMMENDATION:  Ultrasound guided right breast biopsy, which will be performed today but dictated in a separate report.  I have discussed the findings and recommendations with the patient. Results were also provided in writing at the conclusion of the visit.  If applicable, a reminder letter will be sent to the patient regarding the next appointment.   Original Report Authenticated By: Harmon Pier, M.D.   Ct Abdomen Pelvis W Contrast  07/03/2012   *RADIOLOGY REPORT*  Clinical Data:  Breast cancer.  No therapy yet.  Lower inner quadrant of right breast.  CT CHEST, ABDOMEN AND PELVIS WITH CONTRAST  Technique: Contiguous axial images of the chest abdomen and pelvis were obtained after IV contrast administration.  Contrast: 80 ml Omnipaque-300  Comparison: Breast MR of 06/26/2012.  No prior CTs.  CT CHEST  Findings: Lung windows demonstrate minimal left lower lobe subpleural nodularity at 3 mm on image 40/series 4.  Right lung clear.  Soft tissue windows demonstrate small bilateral axillary nodes, without adenopathy.   No subpectoral adenopathy. No supraclavicular adenopathy.  Normal heart size, without pericardial effusion.  No central pulmonary embolism, on this non-dedicated study.  No mediastinal or hilar adenopathy.  No internal mammary adenopathy. Minimal residual thymic tissue in the anterior mediastinum (image 17/series 2).  IMPRESSION:  1. No acute process or evidence of metastatic disease in the chest. 2.  Probable subpleural lymph node in the left lower lobe. Recommend attention on follow-up.  CT ABDOMEN AND PELVIS  Findings:  Mild early phase of hepatic enhancement which decreases sensitivity for focal liver lesions.  None identified.  Normal spleen, stomach, pancreas, gallbladder, biliary tract, adrenal glands, kidneys. No retroperitoneal or retrocrural adenopathy.  Normal colon, appendix, and terminal ileum.  Normal small bowel without abdominal ascites.  No pelvic adenopathy.    Normal urinary bladder and uterus.  No adnexal mass.  Trace free pelvic fluid is likely physiologic.  Transitional right-sided lumbosacral anatomy.  Degenerative disc disease at the lumbosacral junction.  IMPRESSION:  No acute process or evidence of metastatic disease in the abdomen or pelvis.   Original Report Authenticated By: Jeronimo Greaves, M.D.   Mr Breast Right W Wo Contrast  07/12/2012   *RADIOLOGY REPORT*  Clinical Data:  Known invasive mammary carcinoma in the right breast at 3 o'clock posteriorly.  Additional site of abnormal enhancement in the right upper inner quadrant closer to the nipple for possible biopsy.  ATTEMPTED MRI GUIDED VACUUM ASSISTED BIOPSY OF THE RIGHT BREAST WITHOUT AND WITH CONTRAST  Comparison: Previous exams.  Technique: Multiplanar, multisequence MR images of the right breast were obtained prior to and following the intravenous administration of 9 ml of Mulithance.  I met with the patient, and we discussed the procedure of MRI guided biopsy, including risks, benefits, and alternatives. Specifically, we  discussed the risks of infection, bleeding, tissue injury, clip migration, and inadequate sampling.  Informed, written consent was given.  Using sterile technique, 2% Lidocaine, MRI guidance, and a 9 gauge vacuum assisted device, biopsy was attempted using a mediolateral approach.  However, the procedure was unsuccessful due to the extremely  thin breast compression.  IMPRESSION:  Unsuccessful MRI guided vacuum-assisted biopsy of the right breast.  Patient was sent to the [facility] for further evaluation with ultrasound.   Original Report Authenticated By: Cain Saupe, M.D.   Mr Breast Bilateral W Wo Contrast  06/26/2012   *RADIOLOGY REPORT*  Clinical Data: History of new diagnosis of breast cancer from recent ultrasound guided core needle biopsy right breast 06/20/2012 demonstrating invasive ductal carcinoma with DCIS.  GFR 89, creatinine 0.7 on 06/06/2012.  BILATERAL BREAST MRI WITH AND WITHOUT CONTRAST  Technique: Multiplanar, multisequence MR images of both breasts were obtained prior to and following the intravenous administration of 10ml of multihance.  Three dimensional images were evaluated at the independent DynaCad workstation.  Comparison:  Recent mammograms and ultrasound.  Findings: Examination demonstrates mild background parenchymal enhancement.  Right breast:  There is evidence of an ovoid gently lobulated and somewhat ill-defined heterogeneously enhancing mass over the deep third of the inner midportion of the right breast representing patient's biopsy-proven malignancy.  This mass contains central clip artifact and measures approximately 0.9 x 1.9 x 1.0 cm. The anterior border of this mass is located 5.5 cm from the nipple. There is a 6 mm slightly ill-defined enhancing ovoid mass 6 mm posterior lateral to the biopsy-proven malignancy lying just superficial to the pectoralis muscle.  There is also clumped non mass enhancement anterior and slightly superior to the biopsy- proven malignancy  in the middle third at approximately the 2 o'clock position.  This measures approximately 0.8 x 1.6 x 1.2 cm and is located 3 cm from the nipple.  This is suspicious for multifocal disease.  No suspicious axillary or internal mammary lymph nodes.  Left breast:  No suspicious mass, enhancement or adenopathy.  IMPRESSION: Known biopsy-proven malignancy over the deep third of the inner mid right breast.  6 mm ill-defined enhancing ovoid mass 6 mm posterior lateral to the biopsy-proven malignancy and lying just superficial to the pectoralis muscle.  Clumped non mass enhancement anterior and superior to patient's known malignancy at approximately the 2 o'clock position measuring 0.8 x 1.6 x 2.2 cm.  Findings likely represent multi focal disease.  RECOMMENDATION: Recommend second look ultrasound and core biopsy if feasible of the clumped non mass enhancement at approximately the 2 o'clock position right breast anterior/superior to the known malignancy. If not seen by ultrasound, recommend proceeding to MR guided biopsy of this suspicious abnormality.  THREE-DIMENSIONAL MR IMAGE RENDERING ON INDEPENDENT WORKSTATION:  Three-dimensional MR images were rendered by post-processing of the original MR data on an independent workstation.  The three- dimensional MR images were interpreted, and findings were reported in the accompanying complete MRI report for this study.  BI-RADS CATEGORY 0:  Incomplete.  Need additional imaging evaluation and/or prior mammograms for comparison.   Original Report Authenticated By: Elberta Fortis, M.D.   Dg Chest Port 1 View  07/06/2012   *RADIOLOGY REPORT*  Clinical Data: Post left-sided port catheter insertion  PORTABLE CHEST - 1 VIEW  Comparison: 07/04/2012  Findings:  Grossly unchanged cardiac silhouette and mediastinal contours. Interval placement of a left anterior chest wall subclavian vein approach port-a-catheter with tip projecting over the superior cavoatrial junction. Grossly unchanged  symmetric biapical pleural parenchymal thickening.  No pneumothorax.  No focal airspace opacities.  No pleural effusion.  No evidence of edema.  Unchanged bones.  IMPRESSION: Interval placement of a left anterior chest wall subclavian vein approach port-a-catheter with tip overlying the superior cavoatrial junction.  No pneumothorax.  Original Report Authenticated By: Tacey Ruiz, MD   Mm Digital Diagnostic Bilat  06/20/2012   *RADIOLOGY REPORT*  Clinical Data:  52 year old female for annual bilateral mammograms and palpable mass in the upper and inner right breast discovered on clinical examination.  DIGITAL DIAGNOSTIC BILATERAL MAMMOGRAM WITH CAD AND RIGHT BREAST ULTRASOUND:  Comparison:  03/21/2009 and prior mammograms dating back to 01/21/2005  Findings:  ACR Breast Density Category 4: The breast tissue is extremely dense.  Routine and spot compression views of the right breast demonstrate no evidence of suspicious mass or distortion. Scattered punctate calcifications within the breasts bilaterally are again identified.  Mammographic images were processed with CAD.  On physical exam, thickening is identified at the 12 o'clock position of the right breast 4 cm from the nipple. A firm palpable mass identified at the 3 o'clock position of the right breast 4 cm from the nipple.  Ultrasound is performed, showing no evidence of solid or cystic mass, distortion or abnormal areas of shadowing at the 12 o'clock position of the right breast. A 7 x 6 x 17 mm slightly irregular oval hypoechoic mass with calcifications is horizontally oriented at the 3 o'clock position of the right breast 4 cm from the nipple.  IMPRESSION: Indeterminate 7 x 6 x 17 mm hypoechoic mass at the 3 o'clock position of the right breast.  Tissue sampling is recommended.  No mammographic evidence of breast malignancy bilaterally.  No mammographic, palpable or sonographic abnormality at the 12 o'clock position of the right breast, in the area of  palpable concern.  BI-RADS CATEGORY 4:  Suspicious abnormality - biopsy should be considered.  RECOMMENDATION:  Ultrasound guided right breast biopsy, which will be performed today but dictated in a separate report.  I have discussed the findings and recommendations with the patient. Results were also provided in writing at the conclusion of the visit.  If applicable, a reminder letter will be sent to the patient regarding the next appointment.   Original Report Authenticated By: Harmon Pier, M.D.   Mm Digital Diagnostic Unilat R  07/12/2012   *RADIOLOGY REPORT*  Clinical Data:  Ultrasound-guided core needle biopsy of a hypoechoic area at 2 o'clock 2 cm from the right nipple with clip placement.  Known invasive mammary carcinoma in the right breast. Unsuccessful right breast MRI guided biopsy earlier today.  DIGITAL DIAGNOSTIC RIGHT MAMMOGRAM  Comparison:  Previous exams.  Findings:  Films are performed following ultrasound guided biopsy of a small hypoechoic area at 2 o'clock 2 cm from the right nipple. The ribbon clip is positioned within the area of concern. The ribbon clip is approximately 2.6 cm anterior to the top hat clip from the original biopsy on 06/20/2012.  IMPRESSION: Appropriate clip placement following ultrasound-guided core needle biopsy of a hypoechoic area at 2 o'clock 2 cm in the right nipple.   Original Report Authenticated By: Cain Saupe, M.D.   Mm Digital Diagnostic Unilat R  06/20/2012   *RADIOLOGY REPORT*  Clinical Data:  52 year old female - evaluate clip placement following ultrasound guided right breast biopsy.  DIGITAL DIAGNOSTIC RIGHT MAMMOGRAM  Comparison:  Previous exams.  Findings:  Films are performed following ultrasound guided biopsy of the 7 x 6 x 17 mm hypoechoic mass at the 3 o'clock position of the right breast 4 cm from the nipple. The T shaped biopsy clip is in satisfactory position.  IMPRESSION: Satisfactory clip placement following ultrasound guided right breast  biopsy.  Pathology will be followed.   Original Report  Authenticated By: Harmon Pier, M.D.   Dg Fluoro Guide Cv Line-no Report  07/06/2012   CLINICAL DATA: port placement   FLOURO GUIDE CV LINE  Fluoroscopy was utilized by the requesting physician.  No radiographic  interpretation.    [place] Rt Breast Bx W Loc Dev 1st Lesion Img Bx Spec US Guide  07/13/2012   **ADDENDUM** CREATED: 07/13/2012 14:11:45  I spoke with the patient by telephone on 07/13/2012 to discuss pathology results.  Pathology demonstrates  pseudoangiomatous stromal hyperplasia.  The findings were called to the office of Dr. Donell Beers. Although the imaging findings could be related to pseudoangiomatous stromal hyperplasia, excision of the anterior biopsy site is suggested at the time of patient's definitive surgery.  The patient is considering unilateral or bilateral mastectomy at this time.  The patient reports no problems at the biopsy site.  All questions were answered.  Recommendations:  Excision of the biopsy site. The patient is scheduled to begin chemotherapy in 4 days.  **END ADDENDUM** SIGNED BY: Blair Hailey. Manson Passey, M.D.  07/12/2012   *RADIOLOGY REPORT*  Clinical Data:  Unsuccessful MRI guided biopsy of the area of abnormal enhancement in the left anterior upper inner quadrant, anterior to the known invasive mammary carcinoma.  Second look ultrasound performed and a 5 x 4 x 4 mm hypoechoic area found at 2 o'clock 2 cm from the right nipple which will be biopsied.  ULTRASOUND GUIDED VACUUM ASSISTED CORE BIOPSY OF THE RIGHT BREAST  Sonography of the right upper inner quadrant was performed.  There is an ill-defined hypoechoic 5 x 4 x 4 mm area at 2 o'clock 2 cm from the right nipple which will be biopsied with ultrasound guidance.  The patient and I discussed the procedure of ultrasound-guided biopsy, including benefits and alternatives.  We discussed the high likelihood of a successful procedure. We discussed the risks of the procedure  including infection, bleeding, tissue injury, clip migration, and inadequate sampling.  Written informed consent was given.  Using sterile technique, 2% lidocaine, ultrasound guidance, and a 12 gauge vacuum assisted needle, biopsy was performed of the hypoechoic area at 2 o'clock 2 cm from the right nipple using a caudocranial approach.  At the conclusion of the procedure, a within tissue marker clip was deployed into the biopsy cavity. Follow-up 2-view mammogram was performed and dictated separately.  IMPRESSION: Ultrasound-guided biopsy of a 5 x 4 x 4 mm hypoechoic area at 2 o'clock 2 cm from the right nipple.  No apparent complications.   Original Report Authenticated By: Cain Saupe, M.D.   [place] Rt Breast Bx W Loc Dev 1st Lesion Img Bx Spec US Guide  06/21/2012   *RADIOLOGY REPORT*  Clinical Data:  Indeterminate 7 x 6 x 17 mm hypoechoic mass in the inner right breast - for tissue sampling.  ULTRASOUND GUIDED VACUUM ASSISTED CORE BIOPSY OF THE RIGHT BREAST  Comparison: Previous exams.  I met with the patient and we discussed the procedure of ultrasound- guided biopsy, including benefits and alternatives.  We discussed the high likelihood of a successful procedure. We discussed the risks of the procedure including infection, bleeding, tissue injury, clip migration, and inadequate sampling.  Informed written consent was given.  Using sterile technique, 2% lidocaine ultrasound guidance and a 12 gauge vacuum assisted needle biopsy was performed of the 7 x 6 x 17 mm hypoechoic mass at the 3 o'clock position of the right breast 4 cm from the nipple using a lateral approach.  At the conclusion of the procedure,  a T shaped tissue marker clip was deployed into the biopsy cavity.  Follow-up 2-view mammogram was performed and dictated separately.  IMPRESSION: Ultrasound-guided biopsy of right breast mass.  No apparent complications.  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 Histology correlates  with imaging findings.  The patient was contacted by phone on 06/21/2012 and these results given to her which she understood. Her questions were answered. She was encouraged to obtain breast cancer educational material from the [facility] at her convenience. The patient had no complaints with her biopsy site.  Recommend surgery/oncology consultation.  An appointment at the [facility] has been scheduled for 06/28/2012. Recommend bilateral breast MRI, which has been scheduled for 06/26/2012. The patient was informed of these appointments.   Original Report Authenticated By: Harmon Pier, M.D.    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 Patient had other areas seen on MRI biopsy of the second mass was negative. Patient has had her Port-A-Cath placed by Dr. Donell Beers.    #2 Patient proceeded with neoadjuvant chemotherapy to possibly conserve her breast. Chemotherapy consisted of Adriamycin Cytoxan given dose dense for 4 cycles starting on 07/17/2012 with Neulasta support.  This was followed by Taxol/Carbo which started on 09/11/12.  She received 7 cycles of this and it was discontinued early due to neuropathy.  She then received Gemzar/carbo beginning 11/06/12. She received 3 cycles of this therapy and tolerated it well.    #4 vaginal herpes outbreak  #5 Neuropathy:  Gabapentin TID.    PLAN:  #1  Doing well. Ms. Dehne is stable following chemotherapy.  She will set up a date for her surgery with Dr. Donell Beers. She will contact us with that date, so we can arrange to see her afterwards to review final pathology.    #2 I sent Dr. Donell Beers a message requesting her port go ahead and be removed since she's finished with chemotherapy and had difficulty with it on her last cycle.    #3 She will continue the gabapentin TID for neuropathy.    #4 The patient was instructed to contact us with her surgical date.  She  has a tentative follow up scheduled the week of January 30, 2012.    All questions were answered. The patient knows to call the clinic with any problems, questions or concerns. We can certainly see the patient much sooner if necessary.  I spent 25 minutes counseling the patient face to face. The total time spent in the appointment was 30 minutes.  Illa Level, NP Medical Oncology [facility] [phone] 12/13/2012, 12:11 PM

## 2012-12-15 ENCOUNTER — Ambulatory Visit (INDEPENDENT_AMBULATORY_CARE_PROVIDER_SITE_OTHER): Payer: Medicaid Other | Admitting: General Surgery

## 2012-12-15 ENCOUNTER — Encounter (INDEPENDENT_AMBULATORY_CARE_PROVIDER_SITE_OTHER): Payer: Self-pay | Admitting: General Surgery

## 2012-12-15 VITALS — BP 102/60 | HR 84 | Resp 14 | Ht 63.0 in | Wt 130.4 lb

## 2012-12-15 DIAGNOSIS — C50911 Malignant neoplasm of unspecified site of right female breast: Secondary | ICD-10-CM

## 2012-12-15 DIAGNOSIS — C50919 Malignant neoplasm of unspecified site of unspecified female breast: Secondary | ICD-10-CM

## 2012-12-15 DIAGNOSIS — C50311 Malignant neoplasm of lower-inner quadrant of right female breast: Secondary | ICD-10-CM

## 2012-12-15 DIAGNOSIS — C50319 Malignant neoplasm of lower-inner quadrant of unspecified female breast: Secondary | ICD-10-CM

## 2012-12-15 NOTE — Patient Instructions (Signed)
Main risks of surgery are bleeding, infection, damage to adjacent structures, and possible need for more surgery.

## 2012-12-15 NOTE — Assessment & Plan Note (Addendum)
Long discussion was had with patient and sister regarding possible surgical treatment and what impact SLN has on this.  She is very confused.  She started out wanting bilateral mastectomies and reconstruction and has been to see Dr. Marzetta Board, but   She was clinically negative before neoadjuvant chemotherapy, and may still be negative.  However, if she has a positive node, she will likely be recommended to get radiation, and would not be a candidate for immediate reconstruction on the side of the cancer.    She may not require mastectomy for surgery unless her margins from lumpectomy are positive, or unless she has an unacceptable outcome cosmetically.  I discussed risks of SLN biopsy including bleeding, infection, damage to adjacent structures, swelling, numbness, and the possibility of doing more surgery.    We will plan to do SLN biopsy (and remove port) in December.  This way, she can have all information possible to make decision regarding mastectomy/reconstruction.  She may elect to attempt BCT if she has to get radiation anyway.

## 2012-12-15 NOTE — Progress Notes (Signed)
HISTORY: Patient is 52-year-old female who is diagnosed with clinical T1c Right breast cancer in May of 2014. Because of several small satellite lesions and relatively small breast size, she was placed on neoadjuvant therapy.  She has done very well with this. She is tolerating chemotherapy and has had a complete radiologic and clinical response to treatment. I saw her several weeks ago and she reiterated her desire to have bilateral mastectomies. She had not had seen plastic surgery so this was arranged. She saw Dr. Thimappa and they discussed latissimus flaps and tissue expander/implants.  She wanted to go ahead and schedule surgery, but after thinking through everything, she got very anxious and realized that she did not really understand what was going to happen. She also has a malfunctioning port and wanted to have this removed.  She is here to discuss surgical decision making and ramifications of sentinel lymph node biopsy. She brings her sister with her who is a big help in helping her understand and remember what is discussed.   PERTINENT REVIEW OF SYSTEMS: Otherwise negative.    Filed Vitals:   12/15/12 1211  BP: 102/60  Pulse: 84  Resp: 14   Filed Weights   12/15/12 1211  Weight: 130 lb 6.4 oz (59.149 kg)     EXAM:   The patient was not examined today, the visit was spent entirely on counseling.     ASSESSMENT AND PLAN:   Cancer of lower-inner quadrant of RIGHT female breast Long discussion was had with patient and sister regarding possible surgical treatment and what impact SLN has on this.  She is very confused. We attempted to clear up things in step by step fashion.    She was clinically negative before neoadjuvant chemotherapy, and may still be negative.  However, if she has a positive node, she will likely be recommended to get radiation, and would not be a candidate for immediate reconstruction on the side of the cancer.    She may not require mastectomy for surgery  unless her margins from lumpectomy are positive, or unless she has an unacceptable outcome cosmetically.  She is very concerned that her breast will be much smaller, and was already concerned about the breast size previously.    I discussed risks of SLN biopsy including bleeding, infection, damage to adjacent structures, swelling, numbness, and the possibility of doing more surgery.    We will plan to do SLN biopsy (and remove port) in December.  This way, she can have all information possible to make decision regarding mastectomy/reconstruction.  She may elect to attempt BCT if she has to get radiation anyway.       30 min spent in counseling.     Jaime Murillo Lowell Mcgurk, MD Surgical Oncology, General & Endocrine Surgery [facility]  FERNANDEZ,JUAN H, MD Fernandez, Juan H, MD   

## 2012-12-19 ENCOUNTER — Ambulatory Visit (HOSPITAL_BASED_OUTPATIENT_CLINIC_OR_DEPARTMENT_OTHER): Admission: RE | Admit: 2012-12-19 | Payer: Medicaid Other | Source: Ambulatory Visit | Admitting: General Surgery

## 2012-12-19 ENCOUNTER — Encounter (HOSPITAL_BASED_OUTPATIENT_CLINIC_OR_DEPARTMENT_OTHER): Admission: RE | Payer: Self-pay | Source: Ambulatory Visit

## 2012-12-19 SURGERY — REMOVAL PORT-A-CATH
Anesthesia: Monitor Anesthesia Care

## 2013-01-09 ENCOUNTER — Encounter (HOSPITAL_BASED_OUTPATIENT_CLINIC_OR_DEPARTMENT_OTHER): Payer: Self-pay | Admitting: *Deleted

## 2013-01-09 NOTE — Progress Notes (Signed)
Pt alittle forgetful since chemo-she wrote down instructions-to come for urine per dr Donell Beers Last labs cc 12/12/12-wbc was 19-no s/s infection Pt says she had labs dec, but cancer center said no. Will just do urine-pac out and snbx

## 2013-01-10 ENCOUNTER — Encounter (HOSPITAL_BASED_OUTPATIENT_CLINIC_OR_DEPARTMENT_OTHER)
Admission: RE | Admit: 2013-01-10 | Discharge: 2013-01-10 | Disposition: A | Discharge status: Home / Self Care | Payer: Medicaid Other | Source: Ambulatory Visit | Attending: General Surgery | Admitting: General Surgery

## 2013-01-10 LAB — URINALYSIS, ROUTINE W REFLEX MICROSCOPIC
Glucose, UA: NEGATIVE mg/dL
Protein, ur: NEGATIVE mg/dL
Urobilinogen, UA: 0.2 mg/dL (ref 0.0–1.0)
pH: 5.5 (ref 5.0–8.0)

## 2013-01-10 LAB — URINE MICROSCOPIC-ADD ON

## 2013-01-11 LAB — URINE CULTURE: Culture: NO GROWTH

## 2013-01-12 ENCOUNTER — Ambulatory Visit (HOSPITAL_BASED_OUTPATIENT_CLINIC_OR_DEPARTMENT_OTHER): Payer: Medicaid Other | Admitting: Anesthesiology

## 2013-01-12 ENCOUNTER — Encounter (HOSPITAL_COMMUNITY)
Admission: RE | Admit: 2013-01-12 | Discharge: 2013-01-12 | Disposition: A | Discharge status: Home / Self Care | Payer: Medicaid Other | Source: Ambulatory Visit | Attending: General Surgery | Admitting: General Surgery

## 2013-01-12 ENCOUNTER — Ambulatory Visit (HOSPITAL_BASED_OUTPATIENT_CLINIC_OR_DEPARTMENT_OTHER)
Admission: RE | Admit: 2013-01-12 | Discharge: 2013-01-12 | Disposition: A | Discharge status: Home / Self Care | Payer: Medicaid Other | Source: Ambulatory Visit | Attending: General Surgery | Admitting: General Surgery

## 2013-01-12 ENCOUNTER — Encounter (HOSPITAL_BASED_OUTPATIENT_CLINIC_OR_DEPARTMENT_OTHER): Payer: Self-pay | Admitting: *Deleted

## 2013-01-12 ENCOUNTER — Encounter (HOSPITAL_BASED_OUTPATIENT_CLINIC_OR_DEPARTMENT_OTHER): Payer: Medicaid Other | Admitting: Anesthesiology

## 2013-01-12 ENCOUNTER — Encounter (HOSPITAL_BASED_OUTPATIENT_CLINIC_OR_DEPARTMENT_OTHER)
Admission: RE | Disposition: A | Discharge status: Home / Self Care | Payer: Self-pay | Source: Ambulatory Visit | Attending: General Surgery

## 2013-01-12 DIAGNOSIS — C50911 Malignant neoplasm of unspecified site of right female breast: Secondary | ICD-10-CM

## 2013-01-12 DIAGNOSIS — C50919 Malignant neoplasm of unspecified site of unspecified female breast: Secondary | ICD-10-CM

## 2013-01-12 DIAGNOSIS — Z9221 Personal history of antineoplastic chemotherapy: Secondary | ICD-10-CM | POA: Insufficient documentation

## 2013-01-12 DIAGNOSIS — Z452 Encounter for adjustment and management of vascular access device: Secondary | ICD-10-CM | POA: Insufficient documentation

## 2013-01-12 DIAGNOSIS — Z01812 Encounter for preprocedural laboratory examination: Secondary | ICD-10-CM | POA: Insufficient documentation

## 2013-01-12 DIAGNOSIS — C50319 Malignant neoplasm of lower-inner quadrant of unspecified female breast: Secondary | ICD-10-CM | POA: Insufficient documentation

## 2013-01-12 HISTORY — PX: PORT-A-CATH REMOVAL: SHX5289

## 2013-01-12 HISTORY — PX: BREAST LUMPECTOMY: SHX2

## 2013-01-12 LAB — POCT HEMOGLOBIN-HEMACUE: Hemoglobin: 11.4 g/dL — ABNORMAL LOW (ref 12.0–15.0)

## 2013-01-12 SURGERY — REMOVAL PORT-A-CATH
Anesthesia: General | Site: Chest | Laterality: Right

## 2013-01-12 MED ORDER — DEXAMETHASONE SODIUM PHOSPHATE 4 MG/ML IJ SOLN
INTRAMUSCULAR | Status: DC | PRN
  Administered 2013-01-12: 10 mg via INTRAVENOUS

## 2013-01-12 MED ORDER — FENTANYL CITRATE 0.05 MG/ML IJ SOLN
INTRAMUSCULAR | Status: AC
  Filled 2013-01-12: qty 4

## 2013-01-12 MED ORDER — BUPIVACAINE-EPINEPHRINE 0.5% -1:200000 IJ SOLN
INTRAMUSCULAR | Status: DC | PRN
  Administered 2013-01-12: 10 mL

## 2013-01-12 MED ORDER — CEFAZOLIN SODIUM-DEXTROSE 2-3 GM-% IV SOLR: 2.0000 g | INTRAVENOUS | Status: DC

## 2013-01-12 MED ORDER — CHLORHEXIDINE GLUCONATE 4 % EX LIQD: 1.0000 "application " | Freq: Once | CUTANEOUS | Status: DC

## 2013-01-12 MED ORDER — PROMETHAZINE HCL 25 MG/ML IJ SOLN: 6.2500 mg | INTRAMUSCULAR | Status: DC | PRN

## 2013-01-12 MED ORDER — OXYCODONE HCL 5 MG PO TABS
ORAL_TABLET | ORAL | Status: AC
  Filled 2013-01-12: qty 1

## 2013-01-12 MED ORDER — MIDAZOLAM HCL 2 MG/2ML IJ SOLN
INTRAMUSCULAR | Status: AC
  Filled 2013-01-12: qty 2

## 2013-01-12 MED ORDER — HYDROMORPHONE HCL PF 1 MG/ML IJ SOLN: 0.2500 mg | INTRAMUSCULAR | Status: DC | PRN

## 2013-01-12 MED ORDER — FENTANYL CITRATE 0.05 MG/ML IJ SOLN
INTRAMUSCULAR | Status: DC | PRN
  Administered 2013-01-12: 25 ug via INTRAVENOUS
  Administered 2013-01-12: 50 ug via INTRAVENOUS
  Administered 2013-01-12: 25 ug via INTRAVENOUS

## 2013-01-12 MED ORDER — OXYCODONE HCL 5 MG/5ML PO SOLN: 5.0000 mg | Freq: Once | ORAL | Status: AC | PRN

## 2013-01-12 MED ORDER — EPHEDRINE SULFATE 50 MG/ML IJ SOLN
INTRAMUSCULAR | Status: DC | PRN
  Administered 2013-01-12: 10 mg via INTRAVENOUS

## 2013-01-12 MED ORDER — PROPOFOL 10 MG/ML IV BOLUS
INTRAVENOUS | Status: DC | PRN
  Administered 2013-01-12: 150 mg via INTRAVENOUS

## 2013-01-12 MED ORDER — OXYCODONE-ACETAMINOPHEN 5-325 MG PO TABS
1.0000 | ORAL_TABLET | ORAL | Status: DC | PRN
Start: 2013-01-12 — End: 2013-03-07

## 2013-01-12 MED ORDER — LIDOCAINE HCL (CARDIAC) 20 MG/ML IV SOLN
INTRAVENOUS | Status: DC | PRN
  Administered 2013-01-12: 50 mg via INTRAVENOUS

## 2013-01-12 MED ORDER — BUPIVACAINE HCL (PF) 0.25 % IJ SOLN
INTRAMUSCULAR | Status: AC
  Filled 2013-01-12: qty 30

## 2013-01-12 MED ORDER — CEFAZOLIN SODIUM-DEXTROSE 2-3 GM-% IV SOLR
2.0000 g | INTRAVENOUS | Status: AC
  Administered 2013-01-12: 2 g via INTRAVENOUS

## 2013-01-12 MED ORDER — CEFAZOLIN SODIUM-DEXTROSE 2-3 GM-% IV SOLR
INTRAVENOUS | Status: AC
  Filled 2013-01-12: qty 50

## 2013-01-12 MED ORDER — METHYLENE BLUE 1 % INJ SOLN
INTRAMUSCULAR | Status: AC
  Filled 2013-01-12: qty 10

## 2013-01-12 MED ORDER — SODIUM CHLORIDE 0.9 % IJ SOLN
INTRAMUSCULAR | Status: DC | PRN
  Administered 2013-01-12: 12:00:00

## 2013-01-12 MED ORDER — MIDAZOLAM HCL 2 MG/2ML IJ SOLN
1.0000 mg | INTRAMUSCULAR | Status: DC | PRN
  Administered 2013-01-12: 1 mg via INTRAVENOUS

## 2013-01-12 MED ORDER — ONDANSETRON HCL 4 MG/2ML IJ SOLN
INTRAMUSCULAR | Status: DC | PRN
  Administered 2013-01-12: 4 mg via INTRAVENOUS

## 2013-01-12 MED ORDER — LACTATED RINGERS IV SOLN
INTRAVENOUS | Status: DC
  Administered 2013-01-12 (×2): via INTRAVENOUS

## 2013-01-12 MED ORDER — SODIUM CHLORIDE 0.9 % IJ SOLN
INTRAMUSCULAR | Status: AC
  Filled 2013-01-12: qty 10

## 2013-01-12 MED ORDER — TECHNETIUM TC 99M SULFUR COLLOID FILTERED
1.0000 | Freq: Once | INTRAVENOUS | Status: AC | PRN
  Administered 2013-01-12: 1 via INTRADERMAL

## 2013-01-12 MED ORDER — OXYCODONE HCL 5 MG PO TABS
5.0000 mg | ORAL_TABLET | Freq: Once | ORAL | Status: AC | PRN
  Administered 2013-01-12: 5 mg via ORAL

## 2013-01-12 MED ORDER — FENTANYL CITRATE 0.05 MG/ML IJ SOLN
INTRAMUSCULAR | Status: AC
  Filled 2013-01-12: qty 2

## 2013-01-12 MED ORDER — BUPIVACAINE-EPINEPHRINE PF 0.5-1:200000 % IJ SOLN
INTRAMUSCULAR | Status: AC
  Filled 2013-01-12: qty 30

## 2013-01-12 MED ORDER — FENTANYL CITRATE 0.05 MG/ML IJ SOLN
50.0000 ug | INTRAMUSCULAR | Status: DC | PRN
  Administered 2013-01-12: 50 ug via INTRAVENOUS

## 2013-01-12 SURGICAL SUPPLY — 63 items
BANDAGE ELASTIC 6 VELCRO ST LF (GAUZE/BANDAGES/DRESSINGS) IMPLANT
BLADE HEX COATED 2.75 (ELECTRODE) ×3 IMPLANT
BLADE SURG 10 STRL SS (BLADE) ×3 IMPLANT
BLADE SURG 15 STRL LF DISP TIS (BLADE) ×2 IMPLANT
BLADE SURG 15 STRL SS (BLADE) ×1
BNDG COHESIVE 4X5 TAN STRL (GAUZE/BANDAGES/DRESSINGS) ×3 IMPLANT
BNDG GAUZE ELAST 4 BULKY (GAUZE/BANDAGES/DRESSINGS) ×3 IMPLANT
CANISTER SUCT 1200ML W/VALVE (MISCELLANEOUS) ×3 IMPLANT
CHLORAPREP W/TINT 26ML (MISCELLANEOUS) ×3 IMPLANT
CLIP TI LARGE 6 (CLIP) ×3 IMPLANT
CLIP TI MEDIUM 6 (CLIP) ×3 IMPLANT
CLIP TI WIDE RED SMALL 6 (CLIP) ×3 IMPLANT
COVER MAYO STAND STRL (DRAPES) ×3 IMPLANT
COVER PROBE W GEL 5X96 (DRAPES) ×3 IMPLANT
COVER TABLE BACK 60X90 (DRAPES) ×3 IMPLANT
DECANTER SPIKE VIAL GLASS SM (MISCELLANEOUS) IMPLANT
DERMABOND ADVANCED (GAUZE/BANDAGES/DRESSINGS) ×1
DERMABOND ADVANCED .7 DNX12 (GAUZE/BANDAGES/DRESSINGS) ×2 IMPLANT
DEVICE DUBIN W/COMP PLATE 8390 (MISCELLANEOUS) IMPLANT
DRAIN CHANNEL 19F RND (DRAIN) IMPLANT
DRAPE PED LAPAROTOMY (DRAPES) IMPLANT
DRAPE UTILITY XL STRL (DRAPES) ×3 IMPLANT
DRSG TEGADERM 4X4.75 (GAUZE/BANDAGES/DRESSINGS) ×6 IMPLANT
ELECT REM PT RETURN 9FT ADLT (ELECTROSURGICAL) ×3
ELECTRODE REM PT RTRN 9FT ADLT (ELECTROSURGICAL) ×2 IMPLANT
EVACUATOR SILICONE 100CC (DRAIN) IMPLANT
GLOVE BIO SURGEON STRL SZ 6 (GLOVE) ×3 IMPLANT
GLOVE BIOGEL M STRL SZ7.5 (GLOVE) ×3 IMPLANT
GLOVE BIOGEL PI IND STRL 6.5 (GLOVE) ×2 IMPLANT
GLOVE BIOGEL PI IND STRL 8 (GLOVE) ×2 IMPLANT
GLOVE BIOGEL PI INDICATOR 6.5 (GLOVE) ×1
GLOVE BIOGEL PI INDICATOR 8 (GLOVE) ×1
GOWN PREVENTION PLUS XLARGE (GOWN DISPOSABLE) IMPLANT
GOWN PREVENTION PLUS XXLARGE (GOWN DISPOSABLE) ×6 IMPLANT
NDL SAFETY ECLIPSE 18X1.5 (NEEDLE) ×2 IMPLANT
NEEDLE HYPO 18GX1.5 SHARP (NEEDLE) ×1
NEEDLE HYPO 25X1 1.5 SAFETY (NEEDLE) ×6 IMPLANT
NS IRRIG 1000ML POUR BTL (IV SOLUTION) IMPLANT
PACK BASIN DAY SURGERY FS (CUSTOM PROCEDURE TRAY) ×3 IMPLANT
PACK UNIVERSAL I (CUSTOM PROCEDURE TRAY) ×3 IMPLANT
PAD ABD 8X10 STRL (GAUZE/BANDAGES/DRESSINGS) IMPLANT
PENCIL BUTTON HOLSTER BLD 10FT (ELECTRODE) ×3 IMPLANT
PIN SAFETY STERILE (MISCELLANEOUS) IMPLANT
SLEEVE SCD COMPRESS KNEE MED (MISCELLANEOUS) ×3 IMPLANT
SPONGE GAUZE 4X4 12PLY (GAUZE/BANDAGES/DRESSINGS) IMPLANT
SPONGE LAP 18X18 X RAY DECT (DISPOSABLE) IMPLANT
SPONGE LAP 4X18 X RAY DECT (DISPOSABLE) ×3 IMPLANT
STAPLER VISISTAT 35W (STAPLE) ×3 IMPLANT
STOCKINETTE IMPERVIOUS LG (DRAPES) ×3 IMPLANT
STRIP CLOSURE SKIN 1/2X4 (GAUZE/BANDAGES/DRESSINGS) ×3 IMPLANT
SUT MNCRL AB 4-0 PS2 18 (SUTURE) ×3 IMPLANT
SUT MON AB 4-0 PC3 18 (SUTURE) ×3 IMPLANT
SUT SILK 2 0 SH (SUTURE) IMPLANT
SUT VIC AB 2-0 SH 27 (SUTURE) ×1
SUT VIC AB 2-0 SH 27XBRD (SUTURE) ×2 IMPLANT
SUT VIC AB 3-0 SH 27 (SUTURE) ×1
SUT VIC AB 3-0 SH 27X BRD (SUTURE) ×2 IMPLANT
SYR BULB 3OZ (MISCELLANEOUS) IMPLANT
SYR CONTROL 10ML LL (SYRINGE) ×6 IMPLANT
TOWEL OR 17X24 6PK STRL BLUE (TOWEL DISPOSABLE) ×3 IMPLANT
TOWEL OR NON WOVEN STRL DISP B (DISPOSABLE) IMPLANT
TUBE CONNECTING 20X1/4 (TUBING) ×3 IMPLANT
YANKAUER SUCT BULB TIP NO VENT (SUCTIONS) ×3 IMPLANT

## 2013-01-12 NOTE — Interval H&P Note (Signed)
History and Physical Interval Note:  01/12/2013 11:00 AM  [name]  has presented today for surgery, with the diagnosis of right breast cancer  The various methods of treatment have been discussed with the patient and family. After consideration of risks, benefits and other options for treatment, the patient has consented to  Procedure(s) with comments: REMOVAL PORT-A-CATH (N/A) RIGHT SENTINEL LYMPH  NODE BIOPSY (Right) - RIGHT SENTINEL LYMPH NODE BIOPSY as a surgical intervention .  The patient's history has been reviewed, patient examined, no change in status, stable for surgery.  I have reviewed the patient's chart and labs.  Questions were answered to the patient's satisfaction.     Jaime Murillo

## 2013-01-12 NOTE — Op Note (Signed)
Right Sentinel Lymph Node Mapping and Biopsy Procedure Note, Removal of Left subclavian port a cath  Indications: This patient presents with history of right breast cancer with clinically negative axillary lymph node exam.  Pre-operative Diagnosis: right breast cancer, cT1N0M0, s/p neoadjuvant chemotherapy  Post-operative Diagnosis: right breast cancer  Surgeon: Almond Lint   Anesthesia: General endotracheal anesthesia  ASA Class: 2  Procedure Details  The patient was seen in the Holding Room. The risks, benefits, complications, treatment options, and expected outcomes were discussed with the patient. The possibilities of bleeding, infection, the need for additional procedures, failure to diagnose a condition, and creating a complication requiring transfusion or operation were discussed with the patient. The patient concurred with the proposed plan, giving informed consent.  The site of surgery properly noted/marked. The patient was taken to Operating Room # 8, identified, and the procedure verified as Right Sentinel Node Biopsy and port removal. A Time Out was held and the above information confirmed.  The right breast was injected with 2 mL methylene blue diluted up to 5 mL with injectable saline.  This was injected in the subareolar position after alcohol prep.  The right arm, breast, and bilateral chest were prepped and draped in standard fashion.  The port a cath removal was approached first.  The incision was opened with a #15 blade after administration of local anesthetic.  The subcutaneous tissues were divided with the cautery.  The capsule around the port was opened with the cautery.  The port was grasped with a Kocher clamp.  The sutures were removed with the knife.  They all four were retrieved from the incision.  The catheter was then dissected free and the port removed.  Pressure was held along the catheter tract for 2 minutes.  The incision was then irrigated.  Hemostasis was  achieved with the cautery.  The wound was closed with interrupted 3-0 vicryl deep dermal sutures and 4-0 monocryl running subcuticular suture.    Using a hand-held gamma probe, R axillary sentinel nodes were identified transcutaneously.  An oblique incision was created below the axillary hairline.  Dissection was carried through the clavipectoral fascia.  3 level 2 axillary sentinel nodes were removed.  Counts per second were  0, 4600, 0 The first and second ones were blue, the second was hot, and the third was palpable.    The background count was 0 cps.    The axillary incision was closed with a 3-0 vicryl deep dermal interrupted sutures and a 4-0 monocryl subcuticular closure.      Dermabond. At the end of the operation, all sponge, instrument, and needle counts were correct.  Findings: No enlarged nodes  Estimated Blood Loss:  min         Specimens:  3 right axillary sentinel nodes.           Complications:  None; patient tolerated the procedure well.         Disposition: PACU - hemodynamically stable.         Condition: stable

## 2013-01-12 NOTE — Anesthesia Preprocedure Evaluation (Signed)
Anesthesia Evaluation  Patient identified by MRN, date of birth, ID band Patient awake    Reviewed: Allergy & Precautions, H&P , NPO status , Patient's Chart, lab work & pertinent test results  Airway Mallampati: II TM Distance: >3 FB Neck ROM: full    Dental  (+) Teeth Intact and Dental Advidsory Given   Pulmonary neg pulmonary ROS,  breath sounds clear to auscultation  Pulmonary exam normal       Cardiovascular hypertension, On Medications Rhythm:regular Rate:Normal     Neuro/Psych negative neurological ROS  negative psych ROS   GI/Hepatic negative GI ROS, Neg liver ROS,   Endo/Other  negative endocrine ROS  Renal/GU negative Renal ROS     Musculoskeletal   Abdominal   Peds  Hematology   Anesthesia Other Findings   Reproductive/Obstetrics negative OB ROS                           Anesthesia Physical Anesthesia Plan  ASA: II  Anesthesia Plan: General LMA   Post-op Pain Management:    Induction:   Airway Management Planned:   Additional Equipment:   Intra-op Plan:   Post-operative Plan:   Informed Consent: I have reviewed the patients History and Physical, chart, labs and discussed the procedure including the risks, benefits and alternatives for the proposed anesthesia with the patient or authorized representative who has indicated his/her understanding and acceptance.   Dental Advisory Given  Plan Discussed with: Anesthesiologist, CRNA and Surgeon  Anesthesia Plan Comments:         Anesthesia Quick Evaluation

## 2013-01-12 NOTE — Progress Notes (Signed)
Prior to sentinal node injection Dr. Donell Beers in to explain procedure to patient and sister again. All questions answered

## 2013-01-12 NOTE — H&P (View-Only) (Signed)
HISTORY: Patient is 52 year old female who is diagnosed with clinical T1c Right breast cancer in May of 2014. Because of several small satellite lesions and relatively small breast size, she was placed on neoadjuvant therapy.  She has done very well with this. She is tolerating chemotherapy and has had a complete radiologic and clinical response to treatment. I saw her several weeks ago and she reiterated her desire to have bilateral mastectomies. She had not had seen plastic surgery so this was arranged. She saw Dr. Marzetta Board and they discussed latissimus flaps and tissue expander/implants.  She wanted to go ahead and schedule surgery, but after thinking through everything, she got very anxious and realized that she did not really understand what was going to happen. She also has a malfunctioning port and wanted to have this removed.  She is here to discuss surgical decision making and ramifications of sentinel lymph node biopsy. She brings her sister with her who is a big help in helping her understand and remember what is discussed.   PERTINENT REVIEW OF SYSTEMS: Otherwise negative.    Filed Vitals:   12/15/12 1211  BP: 102/60  Pulse: 84  Resp: 14   Filed Weights   12/15/12 1211  Weight: 130 lb 6.4 oz (59.149 kg)     EXAM:   The patient was not examined today, the visit was spent entirely on counseling.     ASSESSMENT AND PLAN:   Cancer of lower-inner quadrant of RIGHT female breast Long discussion was had with patient and sister regarding possible surgical treatment and what impact SLN has on this.  She is very confused. We attempted to clear up things in step by step fashion.    She was clinically negative before neoadjuvant chemotherapy, and may still be negative.  However, if she has a positive node, she will likely be recommended to get radiation, and would not be a candidate for immediate reconstruction on the side of the cancer.    She may not require mastectomy for surgery  unless her margins from lumpectomy are positive, or unless she has an unacceptable outcome cosmetically.  She is very concerned that her breast will be much smaller, and was already concerned about the breast size previously.    I discussed risks of SLN biopsy including bleeding, infection, damage to adjacent structures, swelling, numbness, and the possibility of doing more surgery.    We will plan to do SLN biopsy (and remove port) in December.  This way, she can have all information possible to make decision regarding mastectomy/reconstruction.  She may elect to attempt BCT if she has to get radiation anyway.       30 min spent in counseling.     Maudry Diego, MD Surgical Oncology, General & Endocrine Surgery [facility]  [name], MD [name], MD

## 2013-01-12 NOTE — Anesthesia Postprocedure Evaluation (Signed)
Anesthesia Post Note  Patient: Jaime Murillo  Procedure(s) Performed: Procedure(s) (LRB): REMOVAL PORT-A-CATH (Left) RIGHT SENTINEL LYMPH  NODE BIOPSY (Right)  Anesthesia type: general  Patient location: PACU  Post pain: Pain level controlled  Post assessment: Patient's Cardiovascular Status Stable  Last Vitals:  Filed Vitals:   01/12/13 1345  BP: 153/89  Pulse: 87  Temp: 36.6 C  Resp: 20    Post vital signs: Reviewed and stable  Level of consciousness: sedated  Complications: No apparent anesthesia complications

## 2013-01-12 NOTE — Transfer of Care (Signed)
Immediate Anesthesia Transfer of Care Note  Patient: Jaime Murillo  Procedure(s) Performed: Procedure(s) with comments: REMOVAL PORT-A-CATH (Left) RIGHT SENTINEL LYMPH  NODE BIOPSY (Right) - RIGHT SENTINEL LYMPH NODE BIOPSY  Patient Location: PACU  Anesthesia Type:General  Level of Consciousness: awake, sedated and patient cooperative  Airway & Oxygen Therapy: Patient Spontanous Breathing and Patient connected to face mask oxygen  Post-op Assessment: Report given to PACU RN and Post -op Vital signs reviewed and stable  Post vital signs: Reviewed and stable  Complications: No apparent anesthesia complications

## 2013-01-16 ENCOUNTER — Telehealth (INDEPENDENT_AMBULATORY_CARE_PROVIDER_SITE_OTHER): Payer: Self-pay

## 2013-01-16 ENCOUNTER — Encounter (HOSPITAL_BASED_OUTPATIENT_CLINIC_OR_DEPARTMENT_OTHER): Payer: Self-pay | Admitting: General Surgery

## 2013-01-16 NOTE — Telephone Encounter (Signed)
Pt calling to get path results. I notified pt her results show no tumor in the lymph nodes. The pt wants to know what is the next step now.

## 2013-01-17 ENCOUNTER — Telehealth (INDEPENDENT_AMBULATORY_CARE_PROVIDER_SITE_OTHER): Payer: Self-pay | Admitting: *Deleted

## 2013-01-17 NOTE — Telephone Encounter (Signed)
Called and spoke with patient to let her know per Dr. Donell Beers surg path results negative, benign.  Patient understands she will still come to see Dr. Donell Beers on 01/26/13 to discuss any future plans.

## 2013-01-26 ENCOUNTER — Encounter (INDEPENDENT_AMBULATORY_CARE_PROVIDER_SITE_OTHER): Payer: Self-pay | Admitting: General Surgery

## 2013-01-26 ENCOUNTER — Other Ambulatory Visit (INDEPENDENT_AMBULATORY_CARE_PROVIDER_SITE_OTHER): Payer: Self-pay | Admitting: General Surgery

## 2013-01-26 ENCOUNTER — Ambulatory Visit (INDEPENDENT_AMBULATORY_CARE_PROVIDER_SITE_OTHER): Payer: Medicaid Other | Admitting: General Surgery

## 2013-01-26 VITALS — BP 130/84 | HR 76 | Temp 98.6°F | Resp 14 | Ht 63.0 in | Wt 131.8 lb

## 2013-01-26 DIAGNOSIS — C50911 Malignant neoplasm of unspecified site of right female breast: Secondary | ICD-10-CM

## 2013-01-26 DIAGNOSIS — C50319 Malignant neoplasm of lower-inner quadrant of unspecified female breast: Secondary | ICD-10-CM

## 2013-01-26 DIAGNOSIS — C50311 Malignant neoplasm of lower-inner quadrant of right female breast: Secondary | ICD-10-CM

## 2013-01-26 NOTE — Progress Notes (Signed)
HISTORY: Patient is approximately 2 weeks status post a right axillary sentinel lymph node biopsy. This was performed as the patient did not know whether she would be a candidate for immediate reconstruction. Since the lymph nodes are negative, she is a candidate for immediate reconstruction.  She is having some soreness and some mild swelling in the right armpit. She is not on narcotics. She has noticed that it is difficult to get her right arm over her head.    EXAM: General:  Alert and oriented.   Incision:  Healing well.  Some small hematoma.  Tightness/cording in right axilla.     PATHOLOGY: Diagnosis 1. Lymph node, sentinel, biopsy, right axillary, #1 - ONE LYMPH NODE, NEGATIVE FOR TUMOR (0/1). 2. Lymph node, sentinel, biopsy, right axillary #2 - ONE LYMPH NODE, NEGATIVE FOR TUMOR (0/1). 3. Lymph node, sentinel, biopsy, right axillary #3 - ONE LYMPH NODE, NEGATIVE FOR TUMOR (0/1).   ASSESSMENT AND PLAN:   Cancer of lower-inner quadrant of RIGHT female breast Patient's sentinel lymph node biopsy was negative. She should therefore not need radiation. We'll plan to do bilateral skin sparing mastectomies with reconstruction in conjunction with Dr. Leland Johns.  Reviewed risks of surgery including bleeding, skin issues, infection, difficulty with implants.    Pt will need to see Dr. Leland Johns back to see if she is candidate for nipple sparing mastectomies.  Her nipples are fairly ptotic, and she may not be candidate.     Will refer to PT due to tightness in right axilla and decreased mobility.       Milus Height, MD Surgical Oncology, [facility]  [name], MD [name], MD

## 2013-01-26 NOTE — Assessment & Plan Note (Signed)
Patient's sentinel lymph node biopsy was negative. She should therefore not need radiation. We'll plan to do bilateral skin sparing mastectomies with reconstruction in conjunction with Dr. Leland Johns.  Reviewed risks of surgery including bleeding, skin issues, infection, difficulty with implants.    Pt will need to see Dr. Leland Johns back to see if she is candidate for nipple sparing mastectomies.  Her nipples are fairly ptotic, and she may not be candidate.

## 2013-01-26 NOTE — Patient Instructions (Signed)
Go back to see Dr. Leland Johns to discuss final plan.    Will schedule bilateral mastectomies with reconstruction with Dr. Leland Johns.

## 2013-01-29 ENCOUNTER — Ambulatory Visit (HOSPITAL_BASED_OUTPATIENT_CLINIC_OR_DEPARTMENT_OTHER): Payer: Medicaid Other | Admitting: Adult Health

## 2013-01-29 ENCOUNTER — Telehealth: Payer: Self-pay | Admitting: Oncology

## 2013-01-29 ENCOUNTER — Encounter: Payer: Self-pay | Admitting: Adult Health

## 2013-01-29 ENCOUNTER — Ambulatory Visit: Payer: Medicaid Other | Attending: General Surgery | Admitting: Physical Therapy

## 2013-01-29 VITALS — BP 131/84 | HR 76 | Temp 99.1°F | Resp 18 | Ht 63.0 in | Wt 129.5 lb

## 2013-01-29 DIAGNOSIS — IMO0001 Reserved for inherently not codable concepts without codable children: Secondary | ICD-10-CM | POA: Insufficient documentation

## 2013-01-29 DIAGNOSIS — A6000 Herpesviral infection of urogenital system, unspecified: Secondary | ICD-10-CM

## 2013-01-29 DIAGNOSIS — G589 Mononeuropathy, unspecified: Secondary | ICD-10-CM | POA: Insufficient documentation

## 2013-01-29 DIAGNOSIS — Z171 Estrogen receptor negative status [ER-]: Secondary | ICD-10-CM

## 2013-01-29 DIAGNOSIS — C50919 Malignant neoplasm of unspecified site of unspecified female breast: Secondary | ICD-10-CM

## 2013-01-29 DIAGNOSIS — I890 Lymphedema, not elsewhere classified: Secondary | ICD-10-CM | POA: Insufficient documentation

## 2013-01-29 DIAGNOSIS — C50311 Malignant neoplasm of lower-inner quadrant of right female breast: Secondary | ICD-10-CM

## 2013-01-29 DIAGNOSIS — G609 Hereditary and idiopathic neuropathy, unspecified: Secondary | ICD-10-CM

## 2013-01-29 DIAGNOSIS — M25519 Pain in unspecified shoulder: Secondary | ICD-10-CM | POA: Insufficient documentation

## 2013-01-29 DIAGNOSIS — G629 Polyneuropathy, unspecified: Secondary | ICD-10-CM

## 2013-01-29 NOTE — Progress Notes (Signed)
OFFICE PROGRESS NOTE  CC  [name], MD [address] [place] [address] Dr. Theda Sers Dr. 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 Patient had a palpable mass at the 3:00 position. She was seen at the breast Center on 06/20/2012 she had a mammogram and ultrasound showing a 1.7 cm mass at the 3:00 position. 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 Tumor was grade 3.  #2 MRIs performed on 06/26/2012 showed no biopsy mass at 3:00 but also 2 other adjacent masses at 1 at 2:00 measuring 0.8 x 1.6 x 2.2 cm andalso 6 mm ill-defined mass posterior to the biopsy-proven malignancy.  #3 patient has underwent Port-A-Cath placement in order to begin neoadjuvant chemotherapy initially consisting of Adriamycin Cytoxan to be given dose dense for 4 cycles.  She underwent this from 07/17/12 through 09/04/12 and it was followed by weekly Taxol and carboplatinum for a total of 12 weeks starting on 09/11/12.  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  She will receive Gemzar Carbo starting 11/06/12.  The plan is for her to receive 3 cycles, she completed this on 12/04/12.  #4 Patient underwent sentinel node biopsy with Jaime Murillo on 01/12/13 to evaluate whether or not she was a candidate for immediate reconstruction following bilateral mastectomies.  Sentinel nodes were negative for metastases, therefore she will not need radiation therapy.  She will undergo bilateral nipple sparing mastectomies with immediate reconstruction some time in February with Jaime Murillo and Jaime Murillo.    CURRENT THERAPY: observation, surgery  upcoming in February  INTERVAL HISTORY: Jaime Murillo 53 y.o. female returns for evaluation today of her right breast triple negative invasive ductal carcinoma.  She underwent sentinel node biopsy on 01/12/13 to evaluate for nodal mets in order to determine whether or not she was a candidate for immediate reconstruction following bilateral mastectomies.  Her nodes were negative, therefore she will undergo bilateral nipple sparing mastectomies with immediate reconstruction some time in February.  The surgery is not scheduled as of yet.  She is doing essentially well today.  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  It has actually worsened since stopping the chemotherapy.  She is requesting to see a neurologist.  She tried to increase her Gabapentin and it did not help.  She does have some soreness at the site of her node biopsy.  Otherwise, a 10 point ROS is negative.    MEDICAL HISTORY: Past Medical History  Diagnosis Date  . HSV infection   . NSVD (normal spontaneous vaginal delivery)   . Hypertension     takes HCTZ  . Urinary frequency   . Anemia     takes Ferrous Sulfate occasionally  . Breast cancer 2014    triple negative  . Contact lens/glasses fitting     wears contacts or glasses    ALLERGIES:  has No Known Allergies.  MEDICATIONS:  Current Outpatient Prescriptions  Medication Sig Dispense Refill  . ferrous sulfate 325 (65 FE) MG tablet Take 325 mg by mouth daily with breakfast.      . gabapentin (NEURONTIN) 100 MG capsule 100 mg. Take 3 tablets (345ms total) twice a day and take 4 tablets (  49ms total) at bedtime.      . hydrochlorothiazide (HYDRODIURIL) 25 MG tablet Take 1 tablet (25 mg total) by mouth daily.  30 tablet  11  . Multiple Vitamin (MULTIVITAMIN) tablet Take 1 tablet by mouth daily.      .Marland KitchenoxyCODONE-acetaminophen (ROXICET) 5-325 MG per tablet Take 1-2 tablets by mouth every 4 (four) hours as needed.  30 tablet  0  . valACYclovir (VALTREX) 500 MG tablet Take 1 tablet  (500 mg total) by mouth daily as needed. For herpes  30 tablet  2  . ondansetron (ZOFRAN) 8 MG tablet Take 1 tablet (8 mg total) by mouth every 8 (eight) hours as needed for nausea (Take 1 pill by mouth twice a day as needed  for nausea and vomitting starting 3rd day after chemo).  20 tablet  3  . prochlorperazine (COMPAZINE) 25 MG suppository Place 25 mg rectally every 12 (twelve) hours as needed for nausea (as needed for nausea and vomitting).      . [DISCONTINUED] prochlorperazine (COMPAZINE) 10 MG tablet Take 1 tablet (10 mg total) by mouth every 6 (six) hours as needed (Nausea or vomiting).  30 tablet  1   No current facility-administered medications for this visit.    SURGICAL HISTORY:  Past Surgical History  Procedure Laterality Date  . Tubal ligation  2003    LTL  . Portacath placement N/A 07/06/2012    Procedure: INSERTION PORT-A-CATH;  Surgeon: FStark Klein MD;  Location[facility]  Service: General;  Laterality: N/A;  . Port-a-cath removal Left 01/12/2013    Procedure: REMOVAL PORT-A-CATH;  Surgeon: FStark Klein MD;  [facility]  Service: General;  Laterality: Left;  . Breast lumpectomy Right 01/12/2013    Procedure: RIGHT SENTINEL LYMPH  NODE BIOPSY;  Surgeon: FStark Klein MD;  Location[facility]  Service: General;  Laterality: Right;  RIGHT SENTINEL LYMPH NODE BIOPSY    REVIEW OF SYSTEMS: A 10 point review of systems was conducted and is otherwise negative except for what is noted above.     PHYSICAL EXAMINATION: Blood pressure 131/84, pulse 76, temperature 99.1 F (37.3 C), temperature source Oral, resp. rate 18, height _0  (1.6 m), weight 129 lb 8 oz (58.741 kg). Body mass index is 22.95 kg/(m^2). GENERAL: Patient is a well appearing female in no acute distress HEENT:  Sclerae anicteric.  Oropharynx clear and moist. No ulcerations or evidence of oropharyngeal candidiasis. Neck is supple.  NODES:  No cervical, supraclavicular, or  axillary lymphadenopathy palpated.  BREAST EXAM:   Left breast no masses or nipple discharge Right breast without palpable abnormality, s/p right sentinel node biopsy mildly tender LUNGS:  Clear to auscultation bilaterally.  No wheezes or rhonchi. HEART:  Regular rate and rhythm. No murmur appreciated. ABDOMEN:  Soft, nontender.  Positive, normoactive bowel sounds. No organomegaly palpated. MSK:  No focal spinal tenderness to palpation. Full range of motion bilaterally in the upper extremities. EXTREMITIES:  No peripheral edema.   SKIN:  Clear with no obvious rashes or skin changes. No nail dyscrasia. NEURO:  Nonfocal. Well oriented.  Appropriate affect. ECOG PERFORMANCE STATUS: 0 - Asymptomatic  LABORATORY DATA: Lab Results  Component Value Date   WBC 19.7* 12/12/2012   HGB 11.4* 01/12/2013   HCT 29.5* 12/12/2012   MCV 98.8 12/12/2012   PLT 209 12/12/2012      Chemistry      Component Value Date/Time   NA 138 12/12/2012 0915   NA 137 06/06/2012  0853   K 3.6 12/12/2012 0915   K 3.8 06/06/2012 0853   CL 104 07/17/2012 1357   CL 102 06/06/2012 0853   CO2 27 12/12/2012 0915   CO2 26 06/06/2012 0853   BUN 14.2 12/12/2012 0915   BUN 12 06/06/2012 0853   CREATININE 0.7 12/12/2012 0915   CREATININE 0.70 06/06/2012 0853   CREATININE 0.85 04/24/2009 1114      Component Value Date/Time   CALCIUM 9.7 12/12/2012 0915   CALCIUM 9.3 06/06/2012 0853   ALKPHOS 203* 12/12/2012 0915   ALKPHOS 35* 06/06/2012 0853   AST 18 12/12/2012 0915   AST 14 06/06/2012 0853   ALT 16 12/12/2012 0915   ALT 9 06/06/2012 0853   BILITOT <0.20 12/12/2012 0915   BILITOT 0.8 06/06/2012 0853     ADDITIONAL INFORMATION: PROGNOSTIC INDICATORS - ACIS Results: IMMUNOHISTOCHEMICAL AND MORPHOMETRIC ANALYSIS BY THE AUTOMATED CELLULAR IMAGING SYSTEM (ACIS) Estrogen Receptor: 0%, NEGATIVE Progesterone Receptor: 0%, NEGATIVE Proliferation Marker Ki67: 95% COMMENT: The negative hormone receptor study(ies) in this case  have an internal positive control. REFERENCE RANGE ESTROGEN RECEPTOR NEGATIVE <1% POSITIVE =>1% PROGESTERONE RECEPTOR NEGATIVE <1% POSITIVE =>1% All controls stained appropriately Jaime Cutter MD Pathologist, Electronic Signature ( Signed 06/28/2012) CHROMOGENIC IN-SITU HYBRIDIZATION Results: HER-2/NEU BY CISH - NO AMPLIFICATION OF HER-2 DETECTED. RESULT RATIO OF HER2: CEP 17 SIGNALS 3.61 1 of 3 Duplicate copy FINAL for ZANIYAH, WERNETTE [phone]) ADDITIONAL INFORMATION:(continued) AVERAGE HER2 COPY NUMBER PER CELL 2.45 REFERENCE RANGE NEGATIVE HER2/Chr17 Ratio <2.0 and Average HER2 copy number <4.0 EQUIVOCAL HER2/Chr17 Ratio <2.0 and Average HER2 copy number 4.0 and <6.0 POSITIVE HER2/Chr17 Ratio >=2.0 and/or Average HER2 copy number >=6.0 Jaime Laws MD Pathologist, Electronic Signature ( Signed 06/26/2012) FINAL DIAGNOSIS Diagnosis Breast, right, needle core biopsy, 3:00 position - INVASIVE DUCTAL CARCINOMA SEE COMMENT. - DUCTAL CARCINOMA IN SITU Microscopic Comment Although the grade of tumor is best assessed at resection, with these biopsies, both the invasive and in situ carcinoma are grade III. Breast prognostic studies are pending and will be reported in an addendum. The case is reviewed with Dr. Avis Epley who concurs. (CRR:gt, 06/21/12) Jaime RUND DO Pathologist, Electronic FINAL DIAGNOSIS Diagnosis Breast, right, needle core biopsy, mass - BENIGN BREAST TISSUE WITH FOCAL PSEUDOANGIOMATOUS STROMAL HYPERPLASIA ([place]). NO EVIDENCE OF MALIGNANCY. Microscopic Comment Called to the [facility] on 07/13/12. (JDP:caf 07/13/12)  RADIOGRAPHIC STUDIES:  Dg Chest 2 View  07/04/2012   *RADIOLOGY REPORT*  Clinical Data: Insertion of Port-A-Cath.  Breast cancer. Hypertension.  CHEST - 2 VIEW  Comparison: CT of the chest on 07/03/2012  Findings: Cardiomediastinal silhouette is within normal limits. The lungs are free of focal consolidations and pleural  effusions. Bony structures have a normal appearance.  Contrast is identified within the colon following CT of the abdomen.  IMPRESSION: Negative exam.   Original Report Authenticated By: Nolon Nations, M.D.   Ct Chest W Contrast  07/03/2012   *RADIOLOGY REPORT*  Clinical Data:  Breast cancer.  No therapy yet.  Lower inner quadrant of right breast.  CT CHEST, ABDOMEN AND PELVIS WITH CONTRAST  Technique: Contiguous axial images of the chest abdomen and pelvis were obtained after IV contrast administration.  Contrast: 80 ml Omnipaque-300  Comparison: Breast MR of 06/26/2012.  No prior CTs.  CT CHEST  Findings: Lung windows demonstrate minimal left lower lobe subpleural nodularity at 3 mm on image 40/series 4.  Right lung clear.  Soft tissue windows demonstrate small bilateral axillary nodes, without adenopathy.  No subpectoral  adenopathy. No supraclavicular adenopathy.  Normal heart size, without pericardial effusion.  No central pulmonary embolism, on this non-dedicated study.  No mediastinal or hilar adenopathy.  No internal mammary adenopathy. Minimal residual thymic tissue in the anterior mediastinum (image 17/series 2).  IMPRESSION:  1. No acute process or evidence of metastatic disease in the chest. 2.  Probable subpleural lymph node in the left lower lobe. Recommend attention on follow-up.  CT ABDOMEN AND PELVIS  Findings:  Mild early phase of hepatic enhancement which decreases sensitivity for focal liver lesions.  None identified.  Normal spleen, stomach, pancreas, gallbladder, biliary tract, adrenal glands, kidneys. No retroperitoneal or retrocrural adenopathy.  Normal colon, appendix, and terminal ileum.  Normal small bowel without abdominal ascites.  No pelvic adenopathy.    Normal urinary bladder and uterus.  No adnexal mass.  Trace free pelvic fluid is likely physiologic.  Transitional right-sided lumbosacral anatomy.  Degenerative disc disease at the lumbosacral junction.  IMPRESSION:  No acute  process or evidence of metastatic disease in the abdomen or pelvis.   Original Report Authenticated By: Abigail Miyamoto, M.D.   US Breast Right  06/29/2012   *RADIOLOGY REPORT*  Clinical Data:  53 year old female with newly diagnosed right breast cancer.  Suspicious abnormal enhancement anterior and superior to the biopsy-proven neoplasm - for second look ultrasound evaluation.  RIGHT BREAST ULTRASOUND  Comparison:  Prior mammograms, ultrasounds and MRI.  Findings: Ultrasound is performed, showing the biopsy-proven neoplasm at the 3 o'clock position of the right breast 4 cm from the nipple. No other discrete sonographic abnormalities are identified, specifically medial, superior, and anterior to the biopsy-proven neoplasm.  IMPRESSION: Patchy abnormal enhancement medial superior to the biopsy-proven neoplasm identified on recent MRI is not visualized sonographically as a discrete abnormality.  MR guided biopsy is recommended if breast conservation surgery is considered.  BI-RADS CATEGORY 6:  Known biopsy-proven malignancy - appropriate action should be taken.  RECOMMENDATION: MR guided right breast biopsy, which will be scheduled by our office.  I have discussed the findings and recommendations with the patient. Results were also provided in writing at the conclusion of the visit.  If applicable, a reminder letter will be sent to the patient regarding the next appointment.   Original Report Authenticated By: Margarette Canada, M.D.   US Breast Right  06/20/2012   *RADIOLOGY REPORT*  Clinical Data:  53 year old female for annual bilateral mammograms and palpable mass in the upper and inner right breast discovered on clinical examination.  DIGITAL DIAGNOSTIC BILATERAL MAMMOGRAM WITH CAD AND RIGHT BREAST ULTRASOUND:  Comparison:  03/21/2009 and prior mammograms dating back to 01/21/2005  Findings:  ACR Breast Density Category 4: The breast tissue is extremely dense.  Routine and spot compression views of the right breast  demonstrate no evidence of suspicious mass or distortion. Scattered punctate calcifications within the breasts bilaterally are again identified.  Mammographic images were processed with CAD.  On physical exam, thickening is identified at the 12 o'clock position of the right breast 4 cm from the nipple. A firm palpable mass identified at the 3 o'clock position of the right breast 4 cm from the nipple.  Ultrasound is performed, showing no evidence of solid or cystic mass, distortion or abnormal areas of shadowing at the 12 o'clock position of the right breast. A 7 x 6 x 17 mm slightly irregular oval hypoechoic mass with calcifications is horizontally oriented at the 3 o'clock position of the right breast 4 cm from the nipple.  IMPRESSION: Indeterminate 7  x 6 x 17 mm hypoechoic mass at the 3 o'clock position of the right breast.  Tissue sampling is recommended.  No mammographic evidence of breast malignancy bilaterally.  No mammographic, palpable or sonographic abnormality at the 12 o'clock position of the right breast, in the area of palpable concern.  BI-RADS CATEGORY 4:  Suspicious abnormality - biopsy should be considered.  RECOMMENDATION:  Ultrasound guided right breast biopsy, which will be performed today but dictated in a separate report.  I have discussed the findings and recommendations with the patient. Results were also provided in writing at the conclusion of the visit.  If applicable, a reminder letter will be sent to the patient regarding the next appointment.   Original Report Authenticated By: Margarette Canada, M.D.   Ct Abdomen Pelvis W Contrast  07/03/2012   *RADIOLOGY REPORT*  Clinical Data:  Breast cancer.  No therapy yet.  Lower inner quadrant of right breast.  CT CHEST, ABDOMEN AND PELVIS WITH CONTRAST  Technique: Contiguous axial images of the chest abdomen and pelvis were obtained after IV contrast administration.  Contrast: 80 ml Omnipaque-300  Comparison: Breast MR of 06/26/2012.  No prior CTs.   CT CHEST  Findings: Lung windows demonstrate minimal left lower lobe subpleural nodularity at 3 mm on image 40/series 4.  Right lung clear.  Soft tissue windows demonstrate small bilateral axillary nodes, without adenopathy.  No subpectoral adenopathy. No supraclavicular adenopathy.  Normal heart size, without pericardial effusion.  No central pulmonary embolism, on this non-dedicated study.  No mediastinal or hilar adenopathy.  No internal mammary adenopathy. Minimal residual thymic tissue in the anterior mediastinum (image 17/series 2).  IMPRESSION:  1. No acute process or evidence of metastatic disease in the chest. 2.  Probable subpleural lymph node in the left lower lobe. Recommend attention on follow-up.  CT ABDOMEN AND PELVIS  Findings:  Mild early phase of hepatic enhancement which decreases sensitivity for focal liver lesions.  None identified.  Normal spleen, stomach, pancreas, gallbladder, biliary tract, adrenal glands, kidneys. No retroperitoneal or retrocrural adenopathy.  Normal colon, appendix, and terminal ileum.  Normal small bowel without abdominal ascites.  No pelvic adenopathy.    Normal urinary bladder and uterus.  No adnexal mass.  Trace free pelvic fluid is likely physiologic.  Transitional right-sided lumbosacral anatomy.  Degenerative disc disease at the lumbosacral junction.  IMPRESSION:  No acute process or evidence of metastatic disease in the abdomen or pelvis.   Original Report Authenticated By: Abigail Miyamoto, M.D.   Mr Breast Right W Wo Contrast  07/12/2012   *RADIOLOGY REPORT*  Clinical Data:  Known invasive mammary carcinoma in the right breast at 3 o'clock posteriorly.  Additional site of abnormal enhancement in the right upper inner quadrant closer to the nipple for possible biopsy.  ATTEMPTED MRI GUIDED VACUUM ASSISTED BIOPSY OF THE RIGHT BREAST WITHOUT AND WITH CONTRAST  Comparison: Previous exams.  Technique: Multiplanar, multisequence MR images of the right breast were  obtained prior to and following the intravenous administration of 9 ml of Mulithance.  I met with the patient, and we discussed the procedure of MRI guided biopsy, including risks, benefits, and alternatives. Specifically, we discussed the risks of infection, bleeding, tissue injury, clip migration, and inadequate sampling.  Informed, written consent was given.  Using sterile technique, 2% Lidocaine, MRI guidance, and a 9 gauge vacuum assisted device, biopsy was attempted using a mediolateral approach.  However, the procedure was unsuccessful due to the extremely thin breast compression.  IMPRESSION:  Unsuccessful MRI guided vacuum-assisted biopsy of the right breast.  Patient was sent to the [facility] for further evaluation with ultrasound.   Original Report Authenticated By: Ulyess Blossom, M.D.   Mr Breast Bilateral W Wo Contrast  06/26/2012   *RADIOLOGY REPORT*  Clinical Data: History of new diagnosis of breast cancer from recent ultrasound guided core needle biopsy right breast 06/20/2012 demonstrating invasive ductal carcinoma with DCIS.  GFR 89, creatinine 0.7 on 06/06/2012.  BILATERAL BREAST MRI WITH AND WITHOUT CONTRAST  Technique: Multiplanar, multisequence MR images of both breasts were obtained prior to and following the intravenous administration of 32m of multihance.  Three dimensional images were evaluated at the independent DynaCad workstation.  Comparison:  Recent mammograms and ultrasound.  Findings: Examination demonstrates mild background parenchymal enhancement.  Right breast:  There is evidence of an ovoid gently lobulated and somewhat ill-defined heterogeneously enhancing mass over the deep third of the inner midportion of the right breast representing patient's biopsy-proven malignancy.  This mass contains central clip artifact and measures approximately 0.9 x 1.9 x 1.0 cm. The anterior border of this mass is located 5.5 cm from the nipple. There is a 6 mm slightly ill-defined  enhancing ovoid mass 6 mm posterior lateral to the biopsy-proven malignancy lying just superficial to the pectoralis muscle.  There is also clumped non mass enhancement anterior and slightly superior to the biopsy- proven malignancy in the middle third at approximately the 2 o'clock position.  This measures approximately 0.8 x 1.6 x 1.2 cm and is located 3 cm from the nipple.  This is suspicious for multifocal disease.  No suspicious axillary or internal mammary lymph nodes.  Left breast:  No suspicious mass, enhancement or adenopathy.  IMPRESSION: Known biopsy-proven malignancy over the deep third of the inner mid right breast.  6 mm ill-defined enhancing ovoid mass 6 mm posterior lateral to the biopsy-proven malignancy and lying just superficial to the pectoralis muscle.  Clumped non mass enhancement anterior and superior to patient's known malignancy at approximately the 2 o'clock position measuring 0.8 x 1.6 x 2.2 cm.  Findings likely represent multi focal disease.  RECOMMENDATION: Recommend second look ultrasound and core biopsy if feasible of the clumped non mass enhancement at approximately the 2 o'clock position right breast anterior/superior to the known malignancy. If not seen by ultrasound, recommend proceeding to MR guided biopsy of this suspicious abnormality.  THREE-DIMENSIONAL MR IMAGE RENDERING ON INDEPENDENT WORKSTATION:  Three-dimensional MR images were rendered by post-processing of the original MR data on an independent workstation.  The three- dimensional MR images were interpreted, and findings were reported in the accompanying complete MRI report for this study.  BI-RADS CATEGORY 0:  Incomplete.  Need additional imaging evaluation and/or prior mammograms for comparison.   Original Report Authenticated By: DMarin Olp M.D.   Dg Chest Port 1 View  07/06/2012   *RADIOLOGY REPORT*  Clinical Data: Post left-sided port catheter insertion  PORTABLE CHEST - 1 VIEW  Comparison: 07/04/2012   Findings:  Grossly unchanged cardiac silhouette and mediastinal contours. Interval placement of a left anterior chest wall subclavian vein approach port-a-catheter with tip projecting over the superior cavoatrial junction. Grossly unchanged symmetric biapical pleural parenchymal thickening.  No pneumothorax.  No focal airspace opacities.  No pleural effusion.  No evidence of edema.  Unchanged bones.  IMPRESSION: Interval placement of a left anterior chest wall subclavian vein approach port-a-catheter with tip overlying the superior cavoatrial junction.  No pneumothorax.   Original Report Authenticated By: JSandi Mariscal  V, MD   Mm Digital Diagnostic Bilat  06/20/2012   *RADIOLOGY REPORT*  Clinical Data:  53 year old female for annual bilateral mammograms and palpable mass in the upper and inner right breast discovered on clinical examination.  DIGITAL DIAGNOSTIC BILATERAL MAMMOGRAM WITH CAD AND RIGHT BREAST ULTRASOUND:  Comparison:  03/21/2009 and prior mammograms dating back to 01/21/2005  Findings:  ACR Breast Density Category 4: The breast tissue is extremely dense.  Routine and spot compression views of the right breast demonstrate no evidence of suspicious mass or distortion. Scattered punctate calcifications within the breasts bilaterally are again identified.  Mammographic images were processed with CAD.  On physical exam, thickening is identified at the 12 o'clock position of the right breast 4 cm from the nipple. A firm palpable mass identified at the 3 o'clock position of the right breast 4 cm from the nipple.  Ultrasound is performed, showing no evidence of solid or cystic mass, distortion or abnormal areas of shadowing at the 12 o'clock position of the right breast. A 7 x 6 x 17 mm slightly irregular oval hypoechoic mass with calcifications is horizontally oriented at the 3 o'clock position of the right breast 4 cm from the nipple.  IMPRESSION: Indeterminate 7 x 6 x 17 mm hypoechoic mass at the 3 o'clock  position of the right breast.  Tissue sampling is recommended.  No mammographic evidence of breast malignancy bilaterally.  No mammographic, palpable or sonographic abnormality at the 12 o'clock position of the right breast, in the area of palpable concern.  BI-RADS CATEGORY 4:  Suspicious abnormality - biopsy should be considered.  RECOMMENDATION:  Ultrasound guided right breast biopsy, which will be performed today but dictated in a separate report.  I have discussed the findings and recommendations with the patient. Results were also provided in writing at the conclusion of the visit.  If applicable, a reminder letter will be sent to the patient regarding the next appointment.   Original Report Authenticated By: Margarette Canada, M.D.   Mm Digital Diagnostic Unilat R  07/12/2012   *RADIOLOGY REPORT*  Clinical Data:  Ultrasound-guided core needle biopsy of a hypoechoic area at 2 o'clock 2 cm from the right nipple with clip placement.  Known invasive mammary carcinoma in the right breast. Unsuccessful right breast MRI guided biopsy earlier today.  DIGITAL DIAGNOSTIC RIGHT MAMMOGRAM  Comparison:  Previous exams.  Findings:  Films are performed following ultrasound guided biopsy of a small hypoechoic area at 2 o'clock 2 cm from the right nipple. The ribbon clip is positioned within the area of concern. The ribbon clip is approximately 2.6 cm anterior to the top hat clip from the original biopsy on 06/20/2012.  IMPRESSION: Appropriate clip placement following ultrasound-guided core needle biopsy of a hypoechoic area at 2 o'clock 2 cm in the right nipple.   Original Report Authenticated By: Ulyess Blossom, M.D.   Mm Digital Diagnostic Unilat R  06/20/2012   *RADIOLOGY REPORT*  Clinical Data:  53 year old female - evaluate clip placement following ultrasound guided right breast biopsy.  DIGITAL DIAGNOSTIC RIGHT MAMMOGRAM  Comparison:  Previous exams.  Findings:  Films are performed following ultrasound guided biopsy of  the 7 x 6 x 17 mm hypoechoic mass at the 3 o'clock position of the right breast 4 cm from the nipple. The T shaped biopsy clip is in satisfactory position.  IMPRESSION: Satisfactory clip placement following ultrasound guided right breast biopsy.  Pathology will be followed.   Original Report Authenticated By: Margarette Canada, M.D.  Dg Fluoro Guide Cv Line-no Report  07/06/2012   CLINICAL DATA: port placement   FLOURO GUIDE CV LINE  Fluoroscopy was utilized by the requesting physician.  No radiographic  interpretation.    [place] Rt Breast Bx W Loc Dev 1st Lesion Img Bx Spec US Guide  07/13/2012   **ADDENDUM** CREATED: 07/13/2012 14:11:45  I spoke with the patient by telephone on 07/13/2012 to discuss pathology results.  Pathology demonstrates  pseudoangiomatous stromal hyperplasia.  The findings were called to the office of Jaime Murillo. Although the imaging findings could be related to pseudoangiomatous stromal hyperplasia, excision of the anterior biopsy site is suggested at the time of patient's definitive surgery.  The patient is considering unilateral or bilateral mastectomy at this time.  The patient reports no problems at the biopsy site.  All questions were answered.  Recommendations:  Excision of the biopsy site. The patient is scheduled to begin chemotherapy in 4 days.  **END ADDENDUM** SIGNED BY: Hermenia Fiscal. Owens Shark, M.D.  07/12/2012   *RADIOLOGY REPORT*  Clinical Data:  Unsuccessful MRI guided biopsy of the area of abnormal enhancement in the left anterior upper inner quadrant, anterior to the known invasive mammary carcinoma.  Second look ultrasound performed and a 5 x 4 x 4 mm hypoechoic area found at 2 o'clock 2 cm from the right nipple which will be biopsied.  ULTRASOUND GUIDED VACUUM ASSISTED CORE BIOPSY OF THE RIGHT BREAST  Sonography of the right upper inner quadrant was performed.  There is an ill-defined hypoechoic 5 x 4 x 4 mm area at 2 o'clock 2 cm from the right nipple which will be biopsied with  ultrasound guidance.  The patient and I discussed the procedure of ultrasound-guided biopsy, including benefits and alternatives.  We discussed the high likelihood of a successful procedure. We discussed the risks of the procedure including infection, bleeding, tissue injury, clip migration, and inadequate sampling.  Written informed consent was given.  Using sterile technique, 2% lidocaine, ultrasound guidance, and a 12 gauge vacuum assisted needle, biopsy was performed of the hypoechoic area at 2 o'clock 2 cm from the right nipple using a caudocranial approach.  At the conclusion of the procedure, a within tissue marker clip was deployed into the biopsy cavity. Follow-up 2-view mammogram was performed and dictated separately.  IMPRESSION: Ultrasound-guided biopsy of a 5 x 4 x 4 mm hypoechoic area at 2 o'clock 2 cm from the right nipple.  No apparent complications.   Original Report Authenticated By: Ulyess Blossom, M.D.   [place] Rt Breast Bx W Loc Dev 1st Lesion Img Bx Spec US Guide  06/21/2012   *RADIOLOGY REPORT*  Clinical Data:  Indeterminate 7 x 6 x 17 mm hypoechoic mass in the inner right breast - for tissue sampling.  ULTRASOUND GUIDED VACUUM ASSISTED CORE BIOPSY OF THE RIGHT BREAST  Comparison: Previous exams.  I met with the patient and we discussed the procedure of ultrasound- guided biopsy, including benefits and alternatives.  We discussed the high likelihood of a successful procedure. We discussed the risks of the procedure including infection, bleeding, tissue injury, clip migration, and inadequate sampling.  Informed written consent was given.  Using sterile technique, 2% lidocaine ultrasound guidance and a 12 gauge vacuum assisted needle biopsy was performed of the 7 x 6 x 17 mm hypoechoic mass at the 3 o'clock position of the right breast 4 cm from the nipple using a lateral approach.  At the conclusion of the procedure, a T shaped tissue marker clip was  deployed into the biopsy cavity.  Follow-up  2-view mammogram was performed and dictated separately.  IMPRESSION: Ultrasound-guided biopsy of right breast mass.  No apparent complications.  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 Histology correlates with imaging findings.  The patient was contacted by phone on 06/21/2012 and these results given to her which she understood. Her questions were answered. She was encouraged to obtain breast cancer educational material from the [facility] at her convenience. The patient had no complaints with her biopsy site.  Recommend surgery/oncology consultation.  An appointment at the [facility] has been scheduled for 06/28/2012. Recommend bilateral breast MRI, which has been scheduled for 06/26/2012. The patient was informed of these appointments.   Original Report Authenticated By: Margarette Canada, M.D.    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 Patient had other areas seen on MRI biopsy of the second mass was negative. Patient has had her Port-A-Cath placed by Jaime Murillo.    #2 Patient proceeded with neoadjuvant chemotherapy to possibly conserve her breast. Chemotherapy consisted of Adriamycin Cytoxan given dose dense for 4 cycles starting on 07/17/2012 with Neulasta support.  This was followed by Taxol/Carbo which started on 09/11/12.  She received 7 cycles of this and it was discontinued early due to neuropathy.  She then received Gemzar/carbo beginning 11/06/12. She received 3 cycles of this therapy and tolerated it well.    #4 vaginal herpes outbreak  #5 Neuropathy:  Gabapentin TID.    PLAN:  #1  Doing well. Ms. Duerr is doing well today.  Her sentinel nodes are negative, and therefore she will not need radiation.  She was encouraged with these results.    #2.  I referred her to neurology regarding her neuropathy at her request.  She will continue Gabapentin for the time being.   She declined adding Cymbalta as an adjunct.   #3  She will undergo surgery in February.  She will follow up with [place] for labs and evaluation following her surgery.  We tentatively schedule this today.    All questions were answered. The patient knows to call the clinic with any problems, questions or concerns. We can certainly see the patient much sooner if necessary.  I spent 25 minutes counseling the patient face to face. The total time spent in the appointment was 30 minutes.  Jaime Murillo, [place] [phone] 01/30/2013, 12:57 PM

## 2013-02-01 ENCOUNTER — Encounter: Payer: Self-pay | Admitting: *Deleted

## 2013-02-01 NOTE — Progress Notes (Signed)
[place] Work  Clinical Social Work was referred by Futures trader for assessment of psychosocial needs due to patient having questions about applying for disability.  Clinical Social Worker contacted patient via phone to offer support and assess for needs.  Pt reports she applied for disability and was denied. She has now sought assistance from a lawyer to assist her with this process.   CSW explained disability process and educated patient on how she most likely will not qualify due to the government sees her as able to recover. CSW discussed other resources that may be of assistance and Pt shared she has now qualified for food stamps and medicaid. Pt has 52yo at home, so this allowed her to qualify. Pt has met with financial counselors at [facility] and has used up all resources here as well. CSW discussed with Pt other resources that could assist with rent and utilities. CSW also discussed how to access transportation resources that may assist as well. Pt reports to be coping adequately and stated understanding about limited resources. Pt was appreciative and will reach out to CSW if she needs help with applications or additional assistance.      Clinical Social Work interventions: Supportive Psychiatric nurse and referral  Loren Racer, [place] Social Worker [facility] for [place] Wednesday, Thursday and Friday Phone: [phone] Fax: [phone]

## 2013-02-02 ENCOUNTER — Telehealth: Payer: Self-pay | Admitting: Oncology

## 2013-02-08 ENCOUNTER — Ambulatory Visit (INDEPENDENT_AMBULATORY_CARE_PROVIDER_SITE_OTHER): Payer: Medicaid Other | Admitting: Neurology

## 2013-02-08 ENCOUNTER — Encounter: Payer: Self-pay | Admitting: Neurology

## 2013-02-08 VITALS — BP 142/90 | HR 73 | Ht 63.0 in | Wt 126.0 lb

## 2013-02-08 DIAGNOSIS — I10 Essential (primary) hypertension: Secondary | ICD-10-CM

## 2013-02-08 DIAGNOSIS — T451X5A Adverse effect of antineoplastic and immunosuppressive drugs, initial encounter: Secondary | ICD-10-CM | POA: Insufficient documentation

## 2013-02-08 DIAGNOSIS — G620 Drug-induced polyneuropathy: Secondary | ICD-10-CM | POA: Insufficient documentation

## 2013-02-08 DIAGNOSIS — C50919 Malignant neoplasm of unspecified site of unspecified female breast: Secondary | ICD-10-CM

## 2013-02-08 DIAGNOSIS — G609 Hereditary and idiopathic neuropathy, unspecified: Secondary | ICD-10-CM

## 2013-02-08 DIAGNOSIS — G629 Polyneuropathy, unspecified: Secondary | ICD-10-CM

## 2013-02-08 DIAGNOSIS — Z460 Encounter for fitting and adjustment of spectacles and contact lenses: Secondary | ICD-10-CM | POA: Insufficient documentation

## 2013-02-08 MED ORDER — GABAPENTIN 300 MG PO CAPS: ORAL_CAPSULE | ORAL | Status: DC

## 2013-02-08 NOTE — Progress Notes (Signed)
[facility]  PATIENT: Jaime Murillo DOB: 1960-09-17  HISTORICAL Jaime Murillo is a 53 years old right-handed African American female, was referred by her oncologist Dr. Fredrik Cove evaluation of peripheral neuropathy  She was diagnosed with triple-negative T1 N0 invasive ductal carcinoma of the right breast, in May 2014 she had a palpable mass at the 3:00 position. 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 Tumor was grade 3.   She began to receive chemotherapy, initially consistent of Adriamycin,Cytoxan followed by weekly Taxol and carboplatinum, planned for a total of 12 weeks, beginning 09/11/2012.  Aftert third treatment in early Sep 2014, she began to notice fingertips paresthesia, numbness tingling, drop things out of her hands, could not feel things with her fingertips, later also noticed bilateral toes, sole paresthesia, instead of getting the planned 12 treatment, she only got 6 treatment because progressive worsening bilateral feet and hands paresthesia.  She also described generalized weakness, nausea, legs out underneath her, has fell few times. She denies significant low back pain, no bowel bladder incontinence  She was given gabapentin 300 mg in the morning, 400 mg at nighttime, with no significant improvement   She is planning on to have double mastectomy followed by reconstruction surgery in February 2015 right axillary lymph node biopsy was negative,   REVIEW OF SYSTEMS: Full 14 system review of systems performed and notable only for fatigue, double vision, constipation, joint pain, achy muscles, memory loss, numbness, weakness, difficulty swallowing, insomnia, sleepiness, restless leg, decreased energy   ALLERGIES: No Known Allergies  HOME MEDICATIONS: Outpatient Prescriptions Prior to Visit  Medication Sig Dispense Refill  . ferrous sulfate 325 (65 FE) MG tablet Take 325 mg by mouth  daily with breakfast.      . gabapentin (NEURONTIN) 100 MG capsule 100 mg. Take 3 tablets (300mg s total) twice a day and take 4 tablets (400mg s total) at bedtime.      . hydrochlorothiazide (HYDRODIURIL) 25 MG tablet Take 1 tablet (25 mg total) by mouth daily.  30 tablet  11  . Multiple Vitamin (MULTIVITAMIN) tablet Take 1 tablet by mouth daily.      . ondansetron (ZOFRAN) 8 MG tablet Take 1 tablet (8 mg total) by mouth every 8 (eight) hours as needed for nausea (Take 1 pill by mouth twice a day as needed  for nausea and vomitting starting 3rd day after chemo).  20 tablet  3  . oxyCODONE-acetaminophen (ROXICET) 5-325 MG per tablet Take 1-2 tablets by mouth every 4 (four) hours as needed.  30 tablet  0  . prochlorperazine (COMPAZINE) 25 MG suppository Place 25 mg rectally every 12 (twelve) hours as needed for nausea (as needed for nausea and vomitting).      . valACYclovir (VALTREX) 500 MG tablet Take 1 tablet (500 mg total) by mouth daily as needed. For herpes  30 tablet  2   No facility-administered medications prior to visit.    PAST MEDICAL HISTORY: Past Medical History  Diagnosis Date  . HSV infection   . NSVD (normal spontaneous vaginal delivery)   . Hypertension     takes HCTZ  . Urinary frequency   . Anemia     takes Ferrous Sulfate occasionally  . Breast cancer 2014    triple negative  . Contact lens/glasses fitting     wears contacts or glasses  . High blood pressure     PAST SURGICAL HISTORY: Past Surgical History  Procedure Laterality  Date  . Tubal ligation  2003    LTL  . Portacath placement N/A 07/06/2012    Procedure: INSERTION PORT-A-CATH;  Surgeon: Stark Klein, MD;  Location: [place];  Service: General;  Laterality: N/A;  . Port-a-cath removal Left 01/12/2013    Procedure: REMOVAL PORT-A-CATH;  Surgeon: Stark Klein, MD;  Location: [facility];  Service: General;  Laterality: Left;  . Breast lumpectomy Right 01/12/2013    Procedure: RIGHT SENTINEL  LYMPH  NODE BIOPSY;  Surgeon: Stark Klein, MD;  Location: [facility];  Service: General;  Laterality: Right;  RIGHT SENTINEL LYMPH NODE BIOPSY    FAMILY HISTORY: Family History  Problem Relation Age of Onset  . Heart disease Mother   . Hypertension Father   . Prostate cancer Father 42  . Hypertension Sister   . Ovarian cancer Paternal Grandmother     died in her 30s  . Stomach cancer Maternal Grandmother 70  . Cancer Cousin     paternal cousin with an unknown form of cancer    SOCIAL HISTORY:  History   Social History  . Marital Status: Single    Spouse Name: N/A    Number of Children: 2  . Years of Education: 13   Occupational History  .      Disabled   Social History Main Topics  . Smoking status: Never Smoker   . Smokeless tobacco: Never Used  . Alcohol Use: No  . Drug Use: No  . Sexual Activity: Yes     Comment: BTL   Other Topics Concern  . Not on file   Social History Narrative   Patient lives at home with her son she is single.   Disabled.   Education- One year of college.   Right handed.   Caffeine- One cup daily.     PHYSICAL EXAM   Filed Vitals:   02/08/13 0831  BP: 142/90  Pulse: 73  Height: 5\' 3"  (1.6 m)  Weight: 126 lb (57.153 kg)    Not recorded    Body mass index is 22.33 kg/(m^2).   Generalized: In no acute distress  Neck: Supple, no carotid bruits   Cardiac: Regular rate rhythm  Pulmonary: Clear to auscultation bilaterally  Musculoskeletal: No deformity  Neurological examination  Mentation: Alert oriented to time, place, history taking, and causual conversation  Cranial nerve II-XII: Pupils were equal round reactive to light extraocular movements were full, Visual field were full on confrontational test. Bilateral fundi were sharp.  Facial sensation and strength were normal. Hearing was intact to finger rubbing bilaterally. Uvula tongue midline.  head turning and shoulder shrug and were normal and  symmetric.Tongue protrusion into cheek strength was normal.  Motor: normal tone, bulk and strength.  Sensory: Intact to fine touch, pinprick, preserved vibratory sensation, and proprioception at toes, but her feet are sensitive to touch,.  Coordination: Normal finger to nose, heel-to-shin bilaterally there was no truncal ataxia  Gait: Rising up from seated position without assistance, normal stance,  mild atalgic gait, without trunk ataxia, moderate stride, good arm swing, smooth turning, able to perform tiptoe, and heel walking without difficulty.   Romberg signs: Negative  Deep tendon reflexes: Brachioradialis 2/2, biceps 2/2, triceps 2/2, patellar 2/2, Achilles 2/2, plantar responses were flexor bilaterally.   DIAGNOSTIC DATA (LABS, IMAGING, TESTING) - I reviewed patient records, labs, notes, testing and imaging myself where available.  Lab Results  Component Value Date   WBC 19.7* 12/12/2012   HGB 11.4* 01/12/2013  HCT 29.5* 12/12/2012   MCV 98.8 12/12/2012   PLT 209 12/12/2012      Component Value Date/Time   NA 138 12/12/2012 0915   NA 137 06/06/2012 0853   K 3.6 12/12/2012 0915   K 3.8 06/06/2012 0853   CL 104 07/17/2012 1357   CL 102 06/06/2012 0853   CO2 27 12/12/2012 0915   CO2 26 06/06/2012 0853   GLUCOSE 88 12/12/2012 0915   GLUCOSE 93 07/17/2012 1357   GLUCOSE 83 06/06/2012 0853   BUN 14.2 12/12/2012 0915   BUN 12 06/06/2012 0853   CREATININE 0.7 12/12/2012 0915   CREATININE 0.70 06/06/2012 0853   CREATININE 0.85 04/24/2009 1114   CALCIUM 9.7 12/12/2012 0915   CALCIUM 9.3 06/06/2012 0853   PROT 7.0 12/12/2012 0915   PROT 6.9 06/06/2012 0853   ALBUMIN 3.7 12/12/2012 0915   ALBUMIN 3.9 06/06/2012 0853   AST 18 12/12/2012 0915   AST 14 06/06/2012 0853   ALT 16 12/12/2012 0915   ALT 9 06/06/2012 0853   ALKPHOS 203* 12/12/2012 0915   ALKPHOS 35* 06/06/2012 0853   BILITOT <0.20 12/12/2012 0915   BILITOT 0.8 06/06/2012 0853   Lab Results  Component Value Date   CHOL  159 06/06/2012   HDL 73 06/06/2012   LDLCALC 78 06/06/2012   TRIG 42 06/06/2012   CHOLHDL 2.2 06/06/2012   No results found for this basename: HGBA1C   Lab Results  Component Value Date   VITAMINB12 706 04/24/2009   Lab Results  Component Value Date   TSH 1.495 06/06/2012      ASSESSMENT AND PLAN    53 years old [place] American female, has developed evidence of peripheral neuropathy following chemotherapy Taxol, carboplatin.  1. Laboratory evaluation to rule out treatable cause  2.increase gabapentin to 300 mg 2 tablets 3 times a day  3 return to clinic with Hoyle Sauer in 2 months .          Marcial Pacas, M.D. Ph.D.  [facility] [address] [place], [place] [address] [phone]

## 2013-02-09 LAB — RPR: SYPHILIS RPR SCR: NONREACTIVE

## 2013-02-09 LAB — FOLATE

## 2013-02-09 LAB — VITAMIN B12: Vitamin B-12: 1288 pg/mL — ABNORMAL HIGH (ref 211–946)

## 2013-02-09 LAB — HIV ANTIBODY (ROUTINE TESTING W REFLEX): HIV 1/HIV 2 AB: NONREACTIVE

## 2013-02-09 LAB — THYROID PANEL WITH TSH
FREE THYROXINE INDEX: 2 (ref 1.2–4.9)
T3 Uptake Ratio: 25 % (ref 24–39)
T4, Total: 8.1 ug/dL (ref 4.5–12.0)
TSH: 1.65 u[IU]/mL (ref 0.450–4.500)

## 2013-02-16 ENCOUNTER — Telehealth: Payer: Self-pay | Admitting: Neurology

## 2013-02-16 MED ORDER — ZOLPIDEM TARTRATE 5 MG PO TABS
5.0000 mg | ORAL_TABLET | Freq: Every evening | ORAL | Status: DC | PRN
Start: 2013-02-16 — End: 2013-06-11

## 2013-02-16 MED ORDER — PREGABALIN 50 MG PO CAPS: 100.0000 mg | ORAL_CAPSULE | Freq: Three times a day (TID) | ORAL | Status: DC

## 2013-02-16 NOTE — Telephone Encounter (Signed)
I called pt and she is having continued problems with hands and feet.  (last 3 nights hard time sleeping).  Taking since Monday 600mg  po tid, gabapentin (1800mg  / day).  She did not take this am dose.  Can something else be tried?   [phone] home #.   She had tea cup slip from her hands that she could not feel and cut her self, now is using plastic.     She cannot feel sensation.

## 2013-02-16 NOTE — Telephone Encounter (Signed)
I have called her, she complains of bilateral hand and feet paresthesia, especially at night, now she has pain in her hands, burning, stinging,   Higher dose of neurontin 300mg  ii tid makes her symptoms worse, unbearable,   I will write her Lyrica 50mg  ii tid. ambien 5mg  prn for insomnia.

## 2013-02-16 NOTE — Telephone Encounter (Signed)
Pt called and stated that the meds that Dr. Krista Blue prescribed are making her symptoms worse.  She stated Dr. Krista Blue told her to take one 300 mg Gabapentin 3 times a day for the first week and increase it to two 300 mg pills 3 times a day for the second week and thereafter.  She states she is in more pain.  She now has numbness and pain in her hands and the pain and numbness in her feet has increased.  She states it is worse at night; that there is heat and a sharp tingle in her hands and feet which makes it hard for her to sleep.  Because of these reactions she has not taken this medication this morning.  She would like a call back.  Thank you

## 2013-02-19 ENCOUNTER — Telehealth: Payer: Self-pay | Admitting: Neurology

## 2013-02-19 ENCOUNTER — Ambulatory Visit: Payer: Medicaid Other | Admitting: Physical Therapy

## 2013-02-19 NOTE — Telephone Encounter (Signed)
Erroneous Encounter

## 2013-02-19 NOTE — Telephone Encounter (Signed)
Patient came into the office requesting samples of Lyrica. I spoke with Dr. Krista Blue and the Dr. Asked me to give the patient 2 wks worth of Lyrica 50 mg. I gave the patient 4 boxes of Lyrica 50 mg, with 21 capsules per box, per Dr. Krista Blue. I advised the patient that if she has any other problems, questions or concerns to call the office. Patient verbalized understanding.

## 2013-02-21 ENCOUNTER — Telehealth: Payer: Self-pay | Admitting: Neurology

## 2013-02-21 NOTE — Telephone Encounter (Signed)
Pt called in stating that her Rx for Ambien requires paperwork before Medicaid will approve payment.  She uses [facility].  Thank you

## 2013-02-21 NOTE — Telephone Encounter (Signed)
Medicaid has updated their formulary with the new year.  Although generic Ambien is a preferred drug, they limit the quantity to #15 per 30 days.  I have submitted all required info to ins asking that they grant an exception and allow coverage for #30 tabs per 30 days.

## 2013-02-26 ENCOUNTER — Telehealth: Payer: Self-pay | Admitting: Neurology

## 2013-02-26 NOTE — Telephone Encounter (Signed)
All info was already sent to the ins on 01/28.  They have not made a decision yet.  I called the patient back to advise.  Got no answer.  Left message.

## 2013-02-26 NOTE — Telephone Encounter (Signed)
Pt called in stating that her Pharmacy still has not received any authorization for her Ambien.  Her pharmacy told her to call us again to check the status.  Please see 02-21-13 message.  Please call her and let her know if there is anything that she has to do to get this completed.  Thank you

## 2013-03-05 ENCOUNTER — Telehealth: Payer: Self-pay | Admitting: Neurology

## 2013-03-05 NOTE — Telephone Encounter (Signed)
Dana: Please call patient, moved up appointment with me or Hoyle Sauer in next available, will address her medications questions during followup visit

## 2013-03-05 NOTE — Telephone Encounter (Signed)
Patient calling to state that she believes her Lyrica and Ambien have made her worse, the numbness in her fingers and hands are worse and she is having bad dreams as well as hallucinating, and states her former depressions is returning. Please call and advise.

## 2013-03-05 NOTE — Telephone Encounter (Signed)
Called states she is returning someone's call. I read patients note and advised her that Dr. Krista Blue wants to see her in for a visit and attempted to schedule an appt for her. Patient states she does not have the money or resources to come into a visit right now. States she is having a colonoscopy this week and a mysectomy next week and just wants the physician to call and speak with her on the phone for right now. Because she can't come in for a visit

## 2013-03-05 NOTE — Telephone Encounter (Signed)
Please call patient, she is unable to come in for an appointment due to financial issues.

## 2013-03-05 NOTE — Telephone Encounter (Signed)
Patient is calling about her medication Lyrica and Lorrin Mais have made her worse. The numbness in her fingers and hands are worse. Please advise.

## 2013-03-06 NOTE — Telephone Encounter (Signed)
Called and spoke to patient she has cancer and she is going for a colonoscopy today. Please tell Dr.Yan I would love to come to office sooner I can't afford to. Please ask Dr. Krista Blue if she can call me and direct me because Lyrica  is not agreeing with her she feels really bad. If I don't answer Dr.Yan can leave me a message and advise me if she can.

## 2013-03-07 ENCOUNTER — Encounter (HOSPITAL_COMMUNITY): Payer: Self-pay | Admitting: Pharmacy Technician

## 2013-03-07 MED ORDER — DULOXETINE HCL 30 MG PO CPEP: ORAL_CAPSULE | ORAL | Status: DC

## 2013-03-07 NOTE — Telephone Encounter (Signed)
I have called her, Lyrica made her symptoms, like gabapentin did,   She felt defeated, continues to have bilateral feet and hands paresthesia,  She had colonoscopy, has stopped lyrica,   I will call in cymbalta 30mg , titrating to 2 tabs qam, follow up in May 2015.

## 2013-03-09 NOTE — Pre-Procedure Instructions (Signed)
Jaime Murillo  03/09/2013   Your procedure is scheduled on:  Tues, Feb 24 @ 7:30 AM  Report to [facility] Short Stay Entrance A  at 5:30 AM.  Call this number if you have problems the morning of surgery: [phone]   Remember:   Do not eat food or drink liquids after midnight.   Take these medicines the morning of surgery with A SIP OF WATER: Cymbalta(Duloxetine),Zofran(Ondansetron-if needed),Pain Pill(if needed),Pregabalin(Lyrica),and Valtrex(Valcyclovir)             Stop taking your Ibuprofen and Vit E. No Goody's,BC's,Aleve,Aspirin,Fish Oil,or any Herbal Medications   Do not wear jewelry, make-up or nail polish.  Do not wear lotions, powders, or perfumes. You may wear deodorant.  Do not shave 48 hours prior to surgery.   Do not bring valuables to the hospital.  [facility] is not responsible                  for any belongings or valuables.               Contacts, dentures or bridgework may not be worn into surgery.  Leave suitcase in the car. After surgery it may be brought to your room.  For patients admitted to the hospital, discharge time is determined by your                treatment team.               Patients discharged the day of surgery will not be allowed to drive  home.    Special Instructions:  [facility] - Preparing for Surgery  Before surgery, you can play an important role.  Because skin is not sterile, your skin needs to be as free of germs as possible.  You can reduce the number of germs on you skin by washing with CHG (chlorahexidine gluconate) soap before surgery.  CHG is an antiseptic cleaner which kills germs and bonds with the skin to continue killing germs even after washing.  Please DO NOT use if you have an allergy to CHG or antibacterial soaps.  If your skin becomes reddened/irritated stop using the CHG and inform your nurse when you arrive at Short Stay.  Do not shave (including legs and underarms) for at least 48 hours prior to the first CHG shower.   You may shave your face.  Please follow these instructions carefully:   1.  Shower with CHG Soap the night before surgery and the                                morning of Surgery.  2.  If you choose to wash your hair, wash your hair first as usual with your       normal shampoo.  3.  After you shampoo, rinse your hair and body thoroughly to remove the                      Shampoo.  4.  Use CHG as you would any other liquid soap.  You can apply chg directly       to the skin and wash gently with scrungie or a clean washcloth.  5.  Apply the CHG Soap to your body ONLY FROM THE NECK DOWN.        Do not use on open wounds or open sores.  Avoid contact with your eyes,  ears, mouth and genitals (private parts).  Wash genitals (private parts)       with your normal soap.  6.  Wash thoroughly, paying special attention to the area where your surgery        will be performed.  7.  Thoroughly rinse your body with warm water from the neck down.  8.  DO NOT shower/wash with your normal soap after using and rinsing off       the CHG Soap.  9.  Pat yourself dry with a clean towel.            10.  Wear clean pajamas.            11.  Place clean sheets on your bed the night of your first shower and do not        sleep with pets.  Day of Surgery  Do not apply any lotions/deoderants the morning of surgery.  Please wear clean clothes to the hospital/surgery center.     Please read over the following fact sheets that you were given: Pain Booklet, Coughing and Deep Breathing and Surgical Site Infection Prevention

## 2013-03-12 ENCOUNTER — Encounter (HOSPITAL_COMMUNITY)
Admission: RE | Admit: 2013-03-12 | Discharge: 2013-03-12 | Disposition: A | Discharge status: Home / Self Care | Payer: Medicaid Other | Source: Ambulatory Visit | Attending: General Surgery | Admitting: General Surgery

## 2013-03-12 ENCOUNTER — Encounter (HOSPITAL_COMMUNITY): Payer: Self-pay

## 2013-03-12 DIAGNOSIS — Z01812 Encounter for preprocedural laboratory examination: Secondary | ICD-10-CM | POA: Insufficient documentation

## 2013-03-12 HISTORY — DX: Anxiety disorder, unspecified: F41.9

## 2013-03-12 HISTORY — DX: Major depressive disorder, single episode, unspecified: F32.9

## 2013-03-12 HISTORY — DX: Depression, unspecified: F32.A

## 2013-03-12 HISTORY — DX: Nocturia: R35.1

## 2013-03-12 HISTORY — DX: Insomnia, unspecified: G47.00

## 2013-03-12 LAB — BASIC METABOLIC PANEL
BUN: 15 mg/dL (ref 6–23)
CALCIUM: 10.2 mg/dL (ref 8.4–10.5)
CO2: 26 meq/L (ref 19–32)
CREATININE: 0.67 mg/dL (ref 0.50–1.10)
Chloride: 99 mEq/L (ref 96–112)
GFR calc Af Amer: >90 mL/min (ref >90)
GFR calc non Af Amer: >90 mL/min (ref >90)
GLUCOSE: 90 mg/dL (ref 70–99)
Potassium: 3.9 mEq/L (ref 3.7–5.3)
Sodium: 137 mEq/L (ref 137–147)

## 2013-03-12 LAB — CBC
HCT: 36.4 % (ref 36.0–46.0)
HEMOGLOBIN: 13.3 g/dL (ref 12.0–15.0)
MCH: 33.1 pg (ref 26.0–34.0)
MCHC: 36.5 g/dL — ABNORMAL HIGH (ref 30.0–36.0)
MCV: 90.5 fL (ref 78.0–100.0)
PLATELETS: 235 10*3/uL (ref 150–400)
RBC: 4.02 MIL/uL (ref 3.87–5.11)
RDW: 11.8 % (ref 11.5–15.5)
WBC: 3 10*3/uL — ABNORMAL LOW (ref 4.0–10.5)

## 2013-03-12 LAB — HCG, SERUM, QUALITATIVE: Preg, Serum: NEGATIVE

## 2013-03-12 MED ORDER — CHLORHEXIDINE GLUCONATE 4 % EX LIQD: 1.0000 "application " | Freq: Once | CUTANEOUS | Status: DC

## 2013-03-12 NOTE — Progress Notes (Addendum)
  Pt doesn't have a cardiologist  Denies ever having a stress test/heart cath   Echo report in epic from 2014  EKG and CXR in epic from 07-04-12  Dr.Veita Criss Rosales is Medical Md

## 2013-03-19 MED ORDER — CEFAZOLIN SODIUM-DEXTROSE 2-3 GM-% IV SOLR
2.0000 g | INTRAVENOUS | Status: AC
  Administered 2013-03-20: 2 g via INTRAVENOUS
  Filled 2013-03-19: qty 50

## 2013-03-19 NOTE — H&P (Signed)
  Subjective:   Patient ID: Jaime Murillo is a 53 y.o. female.   HPI  Patient returns for f/u discussion of breast reconstruction. She completed neoadjuvant treatment 11/2012 for a right-sided breast cancer that is triple negative. Initially presented with palpable mass Responded to chemotherapy and resolved radiographically. Presenting mammogram and ultrasound showed a 1.7 cm mass at the 3:00 position. Additional findings on MRI were negative for carcinoma on biopsy; initial 3 o clock mass was not noted on MRI. Since last visit she has undergone SLN bx and this was negative.   Her genetics were negative for BRCA mutation.   We had planned for bilateral mastectomies but due to insurance issues plan now for only right side. Her sister will be her caregiver post procedure. Current 34 B size, would like to be bigger. Thinks she is really closer to A cup.  Initial US demonstrated a 7 x 6 x 17 mm slightly irregular mass with calcifications is horizontally oriented at the 3 o'clock position of the right breast 4 cm from the nipple.   Review of Systems  + wt gain from 117 to 133 lb since start of chemo   Objective:    Physical Exam  Cardiovascular: Normal rate and regular rhythm.  Pulmonary/Chest: Effort normal and breath sounds normal.  Abdominal:  Abdomen soft with reducible umbilical hernia, no panniculus and minimal SQ  Genitourinary: No breast tenderness or discharge.  Breasts grade 1 ptosis No masses or axillary adenopathy SN to nipple R 23. 5 L 23 cm BW R 12 L 12 Nipple to IMF R 6.5 L 6 cm    Assessment:   R breast cancer   Plan:   Reviewed staged reconstruction over multiple surgeries, use of expander and implant choices. Reviewed expected scars, drains and post op pain, activity limitations, visits here. Reviewed risks including but not limited to bleeding, infection, poor incorporation of acellular dermis, need for additional surgery, wound healing problems, DVT/PE.  Her tumor  characteristics as well as breast size/ptosis degree make her a suitable candidate for nipple/areola sparing mastectomy but she does not desire this, concerned in part about any resiudal breast tissue. She would be agreeable to areola sparing mastectomy.

## 2013-03-20 ENCOUNTER — Encounter (HOSPITAL_COMMUNITY): Payer: Medicaid Other | Admitting: Certified Registered Nurse Anesthetist

## 2013-03-20 ENCOUNTER — Inpatient Hospital Stay (HOSPITAL_COMMUNITY): Payer: Medicaid Other | Admitting: Certified Registered Nurse Anesthetist

## 2013-03-20 ENCOUNTER — Encounter (HOSPITAL_COMMUNITY): Payer: Self-pay | Admitting: General Surgery

## 2013-03-20 ENCOUNTER — Encounter (HOSPITAL_COMMUNITY)
Admission: RE | Disposition: A | Discharge status: Home / Self Care | Payer: Self-pay | Source: Ambulatory Visit | Attending: Plastic Surgery

## 2013-03-20 ENCOUNTER — Inpatient Hospital Stay (HOSPITAL_COMMUNITY)
Admission: RE | Admit: 2013-03-20 | Discharge: 2013-03-25 | DRG: 582 | Disposition: A | Discharge status: Home / Self Care | Payer: Medicaid Other | Source: Ambulatory Visit | Attending: Plastic Surgery | Admitting: Plastic Surgery

## 2013-03-20 ENCOUNTER — Encounter (INDEPENDENT_AMBULATORY_CARE_PROVIDER_SITE_OTHER): Payer: Self-pay

## 2013-03-20 DIAGNOSIS — C50919 Malignant neoplasm of unspecified site of unspecified female breast: Principal | ICD-10-CM | POA: Diagnosis present

## 2013-03-20 DIAGNOSIS — G609 Hereditary and idiopathic neuropathy, unspecified: Secondary | ICD-10-CM | POA: Diagnosis present

## 2013-03-20 DIAGNOSIS — K5900 Constipation, unspecified: Secondary | ICD-10-CM | POA: Diagnosis not present

## 2013-03-20 DIAGNOSIS — F3289 Other specified depressive episodes: Secondary | ICD-10-CM | POA: Diagnosis present

## 2013-03-20 DIAGNOSIS — Z79899 Other long term (current) drug therapy: Secondary | ICD-10-CM

## 2013-03-20 DIAGNOSIS — D62 Acute posthemorrhagic anemia: Secondary | ICD-10-CM | POA: Diagnosis not present

## 2013-03-20 DIAGNOSIS — Z171 Estrogen receptor negative status [ER-]: Secondary | ICD-10-CM

## 2013-03-20 DIAGNOSIS — I10 Essential (primary) hypertension: Secondary | ICD-10-CM | POA: Diagnosis present

## 2013-03-20 DIAGNOSIS — B009 Herpesviral infection, unspecified: Secondary | ICD-10-CM | POA: Diagnosis present

## 2013-03-20 DIAGNOSIS — Z800 Family history of malignant neoplasm of digestive organs: Secondary | ICD-10-CM

## 2013-03-20 DIAGNOSIS — Z9851 Tubal ligation status: Secondary | ICD-10-CM

## 2013-03-20 DIAGNOSIS — G4700 Insomnia, unspecified: Secondary | ICD-10-CM | POA: Diagnosis present

## 2013-03-20 DIAGNOSIS — Z8041 Family history of malignant neoplasm of ovary: Secondary | ICD-10-CM

## 2013-03-20 DIAGNOSIS — R42 Dizziness and giddiness: Secondary | ICD-10-CM | POA: Diagnosis not present

## 2013-03-20 DIAGNOSIS — R112 Nausea with vomiting, unspecified: Secondary | ICD-10-CM | POA: Diagnosis not present

## 2013-03-20 DIAGNOSIS — Z9221 Personal history of antineoplastic chemotherapy: Secondary | ICD-10-CM

## 2013-03-20 DIAGNOSIS — Z8249 Family history of ischemic heart disease and other diseases of the circulatory system: Secondary | ICD-10-CM

## 2013-03-20 DIAGNOSIS — Z853 Personal history of malignant neoplasm of breast: Secondary | ICD-10-CM

## 2013-03-20 DIAGNOSIS — F329 Major depressive disorder, single episode, unspecified: Secondary | ICD-10-CM | POA: Diagnosis present

## 2013-03-20 DIAGNOSIS — C50911 Malignant neoplasm of unspecified site of right female breast: Secondary | ICD-10-CM

## 2013-03-20 DIAGNOSIS — F411 Generalized anxiety disorder: Secondary | ICD-10-CM | POA: Diagnosis present

## 2013-03-20 HISTORY — DX: Unspecified mononeuropathy of bilateral lower limbs: G57.93

## 2013-03-20 HISTORY — PX: BREAST RECONSTRUCTION WITH PLACEMENT OF TISSUE EXPANDER AND FLEX HD (ACELLULAR HYDRATED DERMIS): SHX6295

## 2013-03-20 HISTORY — PX: TOTAL MASTECTOMY: SHX6129

## 2013-03-20 LAB — CBC
HEMATOCRIT: 34.3 % — AB (ref 36.0–46.0)
Hemoglobin: 12.1 g/dL (ref 12.0–15.0)
MCH: 31.6 pg (ref 26.0–34.0)
MCHC: 35.3 g/dL (ref 30.0–36.0)
MCV: 89.6 fL (ref 78.0–100.0)
PLATELETS: 175 10*3/uL (ref 150–400)
RBC: 3.83 MIL/uL — ABNORMAL LOW (ref 3.87–5.11)
RDW: 12 % (ref 11.5–15.5)
WBC: 7.5 10*3/uL (ref 4.0–10.5)

## 2013-03-20 LAB — CREATININE, SERUM
Creatinine, Ser: 0.6 mg/dL (ref 0.50–1.10)
GFR calc non Af Amer: >90 mL/min (ref >90)

## 2013-03-20 SURGERY — MASTECTOMY, SIMPLE
Anesthesia: General | Site: Chest | Laterality: Right

## 2013-03-20 MED ORDER — MIDAZOLAM HCL 5 MG/5ML IJ SOLN
INTRAMUSCULAR | Status: DC | PRN
  Administered 2013-03-20: 2 mg via INTRAVENOUS

## 2013-03-20 MED ORDER — SODIUM CHLORIDE 0.9 % IV SOLN
INTRAVENOUS | Status: DC | PRN
  Administered 2013-03-20: 10:00:00

## 2013-03-20 MED ORDER — SODIUM CHLORIDE 0.9 % IJ SOLN
INTRAMUSCULAR | Status: DC | PRN
  Administered 2013-03-20: 50 mL via INTRAVENOUS

## 2013-03-20 MED ORDER — OXYCODONE-ACETAMINOPHEN 5-325 MG PO TABS
1.0000 | ORAL_TABLET | ORAL | Status: DC | PRN
  Administered 2013-03-20 – 2013-03-22 (×2): 2 via ORAL
  Filled 2013-03-20 (×3): qty 2

## 2013-03-20 MED ORDER — 0.9 % SODIUM CHLORIDE (POUR BTL) OPTIME
TOPICAL | Status: DC | PRN
  Administered 2013-03-20 (×3): 1000 mL

## 2013-03-20 MED ORDER — CEFAZOLIN SODIUM 1-5 GM-% IV SOLN
1.0000 g | Freq: Three times a day (TID) | INTRAVENOUS | Status: DC
  Administered 2013-03-20 – 2013-03-23 (×9): 1 g via INTRAVENOUS
  Filled 2013-03-20 (×11): qty 50

## 2013-03-20 MED ORDER — ROCURONIUM BROMIDE 100 MG/10ML IV SOLN
INTRAVENOUS | Status: DC | PRN
  Administered 2013-03-20: 10 mg via INTRAVENOUS
  Administered 2013-03-20: 40 mg via INTRAVENOUS

## 2013-03-20 MED ORDER — IBUPROFEN 400 MG PO TABS
400.0000 mg | ORAL_TABLET | Freq: Four times a day (QID) | ORAL | Status: DC | PRN
  Administered 2013-03-24: 400 mg via ORAL
  Filled 2013-03-20: qty 1

## 2013-03-20 MED ORDER — HYDROMORPHONE HCL PF 1 MG/ML IJ SOLN
INTRAMUSCULAR | Status: AC
  Filled 2013-03-20: qty 1

## 2013-03-20 MED ORDER — GABAPENTIN 300 MG PO CAPS
300.0000 mg | ORAL_CAPSULE | Freq: Three times a day (TID) | ORAL | Status: DC
  Filled 2013-03-20 (×5): qty 1

## 2013-03-20 MED ORDER — DEXAMETHASONE SODIUM PHOSPHATE 4 MG/ML IJ SOLN
INTRAMUSCULAR | Status: AC
  Filled 2013-03-20: qty 2

## 2013-03-20 MED ORDER — LIDOCAINE HCL (CARDIAC) 20 MG/ML IV SOLN
INTRAVENOUS | Status: DC | PRN
  Administered 2013-03-20: 100 mg via INTRAVENOUS

## 2013-03-20 MED ORDER — ENOXAPARIN SODIUM 40 MG/0.4ML ~~LOC~~ SOLN
40.0000 mg | SUBCUTANEOUS | Status: DC
  Administered 2013-03-21: 40 mg via SUBCUTANEOUS
  Filled 2013-03-20 (×2): qty 0.4

## 2013-03-20 MED ORDER — BUPIVACAINE HCL (PF) 0.25 % IJ SOLN
INTRAMUSCULAR | Status: DC | PRN
  Administered 2013-03-20: 30 mL

## 2013-03-20 MED ORDER — MORPHINE SULFATE 2 MG/ML IJ SOLN
1.0000 mg | INTRAMUSCULAR | Status: DC | PRN
  Administered 2013-03-20 – 2013-03-22 (×8): 2 mg via INTRAVENOUS
  Filled 2013-03-20 (×8): qty 1

## 2013-03-20 MED ORDER — NEOSTIGMINE METHYLSULFATE 1 MG/ML IJ SOLN
INTRAMUSCULAR | Status: AC
  Filled 2013-03-20: qty 10

## 2013-03-20 MED ORDER — DULOXETINE HCL 30 MG PO CPEP
30.0000 mg | ORAL_CAPSULE | Freq: Every day | ORAL | Status: DC
  Administered 2013-03-24 – 2013-03-25 (×2): 30 mg via ORAL
  Filled 2013-03-20 (×6): qty 1

## 2013-03-20 MED ORDER — ARTIFICIAL TEARS OP OINT
TOPICAL_OINTMENT | OPHTHALMIC | Status: DC | PRN
Start: 2013-03-20 — End: 2013-03-20
  Administered 2013-03-20: 1 via OPHTHALMIC

## 2013-03-20 MED ORDER — HYDROMORPHONE HCL PF 1 MG/ML IJ SOLN
0.2500 mg | INTRAMUSCULAR | Status: DC | PRN
  Administered 2013-03-20 (×3): 0.5 mg via INTRAVENOUS

## 2013-03-20 MED ORDER — NEOSTIGMINE METHYLSULFATE 1 MG/ML IJ SOLN
INTRAMUSCULAR | Status: DC | PRN
  Administered 2013-03-20: 5 mg via INTRAVENOUS

## 2013-03-20 MED ORDER — LIDOCAINE HCL (CARDIAC) 20 MG/ML IV SOLN
INTRAVENOUS | Status: AC
  Filled 2013-03-20: qty 5

## 2013-03-20 MED ORDER — ONDANSETRON HCL 4 MG/2ML IJ SOLN
INTRAMUSCULAR | Status: DC | PRN
  Administered 2013-03-20: 4 mg via INTRAVENOUS

## 2013-03-20 MED ORDER — ACETAMINOPHEN 500 MG PO TABS: 1000.0000 mg | ORAL_TABLET | Freq: Four times a day (QID) | ORAL | Status: DC | PRN

## 2013-03-20 MED ORDER — FENTANYL CITRATE 0.05 MG/ML IJ SOLN
INTRAMUSCULAR | Status: AC
  Filled 2013-03-20: qty 5

## 2013-03-20 MED ORDER — ZOLPIDEM TARTRATE 5 MG PO TABS
5.0000 mg | ORAL_TABLET | Freq: Every evening | ORAL | Status: DC | PRN
  Filled 2013-03-20: qty 1

## 2013-03-20 MED ORDER — ROCURONIUM BROMIDE 50 MG/5ML IV SOLN
INTRAVENOUS | Status: AC
  Filled 2013-03-20: qty 1

## 2013-03-20 MED ORDER — PROCHLORPERAZINE 25 MG RE SUPP
25.0000 mg | Freq: Two times a day (BID) | RECTAL | Status: DC | PRN
  Administered 2013-03-23 – 2013-03-24 (×2): 25 mg via RECTAL
  Filled 2013-03-20 (×2): qty 1

## 2013-03-20 MED ORDER — ONDANSETRON HCL 4 MG PO TABS: 8.0000 mg | ORAL_TABLET | Freq: Three times a day (TID) | ORAL | Status: DC | PRN

## 2013-03-20 MED ORDER — MIDAZOLAM HCL 2 MG/2ML IJ SOLN
INTRAMUSCULAR | Status: AC
  Filled 2013-03-20: qty 2

## 2013-03-20 MED ORDER — HYDROCHLOROTHIAZIDE 25 MG PO TABS
25.0000 mg | ORAL_TABLET | Freq: Every day | ORAL | Status: DC
  Administered 2013-03-20 – 2013-03-25 (×6): 25 mg via ORAL
  Filled 2013-03-20 (×6): qty 1

## 2013-03-20 MED ORDER — PROPOFOL 10 MG/ML IV BOLUS
INTRAVENOUS | Status: AC
  Filled 2013-03-20: qty 20

## 2013-03-20 MED ORDER — ONDANSETRON HCL 4 MG PO TABS
4.0000 mg | ORAL_TABLET | Freq: Four times a day (QID) | ORAL | Status: DC | PRN
  Administered 2013-03-23 – 2013-03-24 (×3): 4 mg via ORAL
  Filled 2013-03-20 (×3): qty 1

## 2013-03-20 MED ORDER — ONDANSETRON HCL 4 MG/2ML IJ SOLN
4.0000 mg | Freq: Four times a day (QID) | INTRAMUSCULAR | Status: DC | PRN
  Administered 2013-03-20 – 2013-03-22 (×5): 4 mg via INTRAVENOUS
  Filled 2013-03-20 (×7): qty 2

## 2013-03-20 MED ORDER — KETOROLAC TROMETHAMINE 15 MG/ML IJ SOLN: 15.0000 mg | Freq: Four times a day (QID) | INTRAMUSCULAR | Status: DC | PRN

## 2013-03-20 MED ORDER — GLYCOPYRROLATE 0.2 MG/ML IJ SOLN
INTRAMUSCULAR | Status: DC | PRN
  Administered 2013-03-20: 0.6 mg via INTRAVENOUS

## 2013-03-20 MED ORDER — DEXAMETHASONE SODIUM PHOSPHATE 4 MG/ML IJ SOLN
INTRAMUSCULAR | Status: DC | PRN
  Administered 2013-03-20: 8 mg via INTRAVENOUS

## 2013-03-20 MED ORDER — ARTIFICIAL TEARS OP OINT
TOPICAL_OINTMENT | OPHTHALMIC | Status: AC
  Filled 2013-03-20: qty 3.5

## 2013-03-20 MED ORDER — OXYCODONE-ACETAMINOPHEN 5-325 MG PO TABS: 1.0000 | ORAL_TABLET | ORAL | Status: DC | PRN

## 2013-03-20 MED ORDER — KETOROLAC TROMETHAMINE 15 MG/ML IJ SOLN
15.0000 mg | Freq: Four times a day (QID) | INTRAMUSCULAR | Status: AC
  Administered 2013-03-20 – 2013-03-21 (×4): 15 mg via INTRAVENOUS
  Filled 2013-03-20 (×4): qty 1

## 2013-03-20 MED ORDER — GLYCOPYRROLATE 0.2 MG/ML IJ SOLN
INTRAMUSCULAR | Status: AC
  Filled 2013-03-20: qty 3

## 2013-03-20 MED ORDER — KCL IN DEXTROSE-NACL 20-5-0.45 MEQ/L-%-% IV SOLN
INTRAVENOUS | Status: DC
  Administered 2013-03-20 – 2013-03-22 (×4): via INTRAVENOUS
  Filled 2013-03-20 (×5): qty 1000

## 2013-03-20 MED ORDER — BUPIVACAINE HCL (PF) 0.25 % IJ SOLN
INTRAMUSCULAR | Status: AC
  Filled 2013-03-20: qty 30

## 2013-03-20 MED ORDER — PROPOFOL 10 MG/ML IV BOLUS
INTRAVENOUS | Status: DC | PRN
  Administered 2013-03-20: 160 mg via INTRAVENOUS

## 2013-03-20 MED ORDER — LACTATED RINGERS IV SOLN
INTRAVENOUS | Status: DC | PRN
  Administered 2013-03-20 (×2): via INTRAVENOUS

## 2013-03-20 MED ORDER — ONDANSETRON HCL 4 MG/2ML IJ SOLN
INTRAMUSCULAR | Status: AC
  Filled 2013-03-20: qty 2

## 2013-03-20 MED ORDER — FERROUS SULFATE 325 (65 FE) MG PO TABS
325.0000 mg | ORAL_TABLET | Freq: Every day | ORAL | Status: DC
  Administered 2013-03-21 – 2013-03-22 (×2): 325 mg via ORAL
  Filled 2013-03-20 (×4): qty 1

## 2013-03-20 MED ORDER — PREGABALIN 50 MG PO CAPS
100.0000 mg | ORAL_CAPSULE | Freq: Three times a day (TID) | ORAL | Status: DC
  Filled 2013-03-20 (×2): qty 2

## 2013-03-20 MED ORDER — FENTANYL CITRATE 0.05 MG/ML IJ SOLN
INTRAMUSCULAR | Status: DC | PRN
  Administered 2013-03-20 (×3): 50 ug via INTRAVENOUS
  Administered 2013-03-20: 100 ug via INTRAVENOUS

## 2013-03-20 MED ORDER — CHLORHEXIDINE GLUCONATE 4 % EX LIQD: 1.0000 "application " | Freq: Once | CUTANEOUS | Status: DC

## 2013-03-20 MED ORDER — VALACYCLOVIR HCL 500 MG PO TABS
500.0000 mg | ORAL_TABLET | Freq: Every day | ORAL | Status: DC
  Administered 2013-03-20 – 2013-03-23 (×4): 500 mg via ORAL
  Filled 2013-03-20 (×4): qty 1

## 2013-03-20 MED ORDER — SODIUM CHLORIDE 0.9 % IV SOLN
Freq: Once | INTRAVENOUS | Status: DC
  Filled 2013-03-20: qty 1

## 2013-03-20 SURGICAL SUPPLY — 68 items
BAG DECANTER FOR FLEXI CONT (MISCELLANEOUS) ×4 IMPLANT
BINDER BREAST LRG (GAUZE/BANDAGES/DRESSINGS) ×4 IMPLANT
BINDER BREAST XLRG (GAUZE/BANDAGES/DRESSINGS) IMPLANT
CANISTER SUCTION 2500CC (MISCELLANEOUS) ×8 IMPLANT
CHLORAPREP W/TINT 26ML (MISCELLANEOUS) ×4 IMPLANT
CLOTH BEACON ORANGE TIMEOUT ST (SAFETY) ×4 IMPLANT
COVER SURGICAL LIGHT HANDLE (MISCELLANEOUS) ×8 IMPLANT
DERMABOND ADVANCED (GAUZE/BANDAGES/DRESSINGS) ×2
DERMABOND ADVANCED .7 DNX12 (GAUZE/BANDAGES/DRESSINGS) ×2 IMPLANT
DEVICE DISSECT PLASMABLAD 3.0S (MISCELLANEOUS) ×2 IMPLANT
DRAIN CHANNEL 19F RND (DRAIN) ×4 IMPLANT
DRAPE ORTHO SPLIT 77X108 STRL (DRAPES) ×4
DRAPE PROXIMA HALF (DRAPES) ×8 IMPLANT
DRAPE SURG 17X23 STRL (DRAPES) ×16 IMPLANT
DRAPE SURG ORHT 6 SPLT 77X108 (DRAPES) ×4 IMPLANT
DRAPE WARM FLUID 44X44 (DRAPE) ×4 IMPLANT
DRESSING ALLEVYN LIFE SACRUM (GAUZE/BANDAGES/DRESSINGS) ×4 IMPLANT
DRSG PAD ABDOMINAL 8X10 ST (GAUZE/BANDAGES/DRESSINGS) IMPLANT
DRSG TEGADERM 4X4.75 (GAUZE/BANDAGES/DRESSINGS) ×8 IMPLANT
ELECT BLADE 4.0 EZ CLEAN MEGAD (MISCELLANEOUS) ×4
ELECT REM PT RETURN 9FT ADLT (ELECTROSURGICAL) ×8
ELECTRODE BLDE 4.0 EZ CLN MEGD (MISCELLANEOUS) ×2 IMPLANT
ELECTRODE REM PT RTRN 9FT ADLT (ELECTROSURGICAL) ×4 IMPLANT
EVACUATOR SILICONE 100CC (DRAIN) ×4 IMPLANT
EXPANDER BREAST 275CC (Breast) ×4 IMPLANT
GLOVE BIO SURGEON STRL SZ 6 (GLOVE) ×8 IMPLANT
GLOVE BIOGEL PI IND STRL 6.5 (GLOVE) ×4 IMPLANT
GLOVE BIOGEL PI IND STRL 7.0 (GLOVE) ×4 IMPLANT
GLOVE BIOGEL PI INDICATOR 6.5 (GLOVE) ×4
GLOVE BIOGEL PI INDICATOR 7.0 (GLOVE) ×4
GLOVE SS BIOGEL STRL SZ 6.5 (GLOVE) ×2 IMPLANT
GLOVE SUPERSENSE BIOGEL SZ 6.5 (GLOVE) ×2
GLOVE SURG SS PI 7.0 STRL IVOR (GLOVE) ×4 IMPLANT
GOWN PREVENTION PLUS XXLARGE (GOWN DISPOSABLE) ×4 IMPLANT
GOWN STRL NON-REIN LRG LVL3 (GOWN DISPOSABLE) ×12 IMPLANT
GRAFT FLEX HD 4X16 THICK (Tissue Mesh) ×4 IMPLANT
KIT BASIN OR (CUSTOM PROCEDURE TRAY) ×8 IMPLANT
KIT ROOM TURNOVER OR (KITS) ×4 IMPLANT
NEEDLE SPNL 22GX3.5 QUINCKE BK (NEEDLE) ×4 IMPLANT
NS IRRIG 1000ML POUR BTL (IV SOLUTION) ×12 IMPLANT
PACK GENERAL/GYN (CUSTOM PROCEDURE TRAY) ×8 IMPLANT
PAD ABD 8X10 STRL (GAUZE/BANDAGES/DRESSINGS) ×4 IMPLANT
PAD ARMBOARD 7.5X6 YLW CONV (MISCELLANEOUS) ×8 IMPLANT
PIN SAFETY STERILE (MISCELLANEOUS) ×4 IMPLANT
PLASMABLADE 3.0S (MISCELLANEOUS) ×4
SET ASEPTIC TRANSFER (MISCELLANEOUS) ×4 IMPLANT
SPONGE GAUZE 4X4 12PLY (GAUZE/BANDAGES/DRESSINGS) IMPLANT
SPONGE LAP 18X18 X RAY DECT (DISPOSABLE) ×4 IMPLANT
STAPLER VISISTAT 35W (STAPLE) IMPLANT
SUT ETHILON 2 0 FS 18 (SUTURE) ×4 IMPLANT
SUT MNCRL AB 4-0 PS2 18 (SUTURE) IMPLANT
SUT MON AB 5-0 PS2 18 (SUTURE) IMPLANT
SUT PDS AB 2-0 CT1 27 (SUTURE) IMPLANT
SUT SILK 3 0 SH 30 (SUTURE) IMPLANT
SUT VIC AB 3-0 PS2 18 (SUTURE) ×6
SUT VIC AB 3-0 PS2 18XBRD (SUTURE) ×6 IMPLANT
SUT VIC AB 3-0 SH 27 (SUTURE)
SUT VIC AB 3-0 SH 27X BRD (SUTURE) IMPLANT
SUT VIC AB 3-0 SH 8-18 (SUTURE) ×4 IMPLANT
SUT VICRYL 4-0 PS2 18IN ABS (SUTURE) ×4 IMPLANT
SYR 50ML SLIP (SYRINGE) ×4 IMPLANT
TOWEL OR 17X24 6PK STRL BLUE (TOWEL DISPOSABLE) ×4 IMPLANT
TOWEL OR 17X26 10 PK STRL BLUE (TOWEL DISPOSABLE) ×4 IMPLANT
TRAY FOLEY CATH 16FRSI W/METER (SET/KITS/TRAYS/PACK) IMPLANT
TUBE CONNECTING 12'X1/4 (SUCTIONS) ×1
TUBE CONNECTING 12X1/4 (SUCTIONS) ×3 IMPLANT
TUBE CONNECTING 20'X1/4 (TUBING) ×1
TUBE CONNECTING 20X1/4 (TUBING) ×3 IMPLANT

## 2013-03-20 NOTE — Preoperative (Signed)
Beta Blockers   Reason not to administer Beta Blockers:Not Applicable 

## 2013-03-20 NOTE — Interval H&P Note (Signed)
History and Physical Interval Note:  03/20/2013 7:16 AM  Jaime Murillo  has presented today for surgery, with the diagnosis of right breast cancer  The various methods of treatment have been discussed with the patient and family. After consideration of risks, benefits and other options for treatment, the patient has consented to  Right breast reconstruction with tissue expander and acellular dermis as a surgical intervention .  The patient's history has been reviewed, patient examined, no change in status, stable for surgery.  I have reviewed the patient's chart and labs.  Questions were answered to the patient's satisfaction.     Viraat Vanpatten

## 2013-03-20 NOTE — Discharge Instructions (Signed)
[facility] °Office Phone Number [phone] ° °BREAST BIOPSY/ PARTIAL MASTECTOMY: POST OP INSTRUCTIONS ° °Always review your discharge instruction sheet given to you by the facility where your surgery was performed. ° °IF YOU HAVE DISABILITY OR FAMILY LEAVE FORMS, YOU MUST BRING THEM TO THE OFFICE FOR PROCESSING.  DO NOT GIVE THEM TO YOUR DOCTOR. ° °1. A prescription for pain medication may be given to you upon discharge.  Take your pain medication as prescribed, if needed.  If narcotic pain medicine is not needed, then you may take acetaminophen (Tylenol) or ibuprofen (Advil) as needed. °2. Take your usually prescribed medications unless otherwise directed °3. If you need a refill on your pain medication, please contact your pharmacy.  They will contact our office to request authorization.  Prescriptions will not be filled after 5pm or on week-ends. °4. You should eat very light the first 24 hours after surgery, such as soup, crackers, pudding, etc.  Resume your normal diet the day after surgery. °5. Most patients will experience some swelling and bruising in the breast.  Ice packs and a good support bra will help.  Swelling and bruising can take several days to resolve.  °6. It is common to experience some constipation if taking pain medication after surgery.  Increasing fluid intake and taking a stool softener will usually help or prevent this problem from occurring.  A mild laxative (Milk of Magnesia or Miralax) should be taken according to package directions if there are no bowel movements after 48 hours. °7. Unless discharge instructions indicate otherwise, you may remove your bandages 48 hours after surgery, and you may shower at that time.  You may have steri-strips (small skin tapes) in place directly over the incision.  These strips should be left on the skin for 7-10 days.   Any sutures or staples will be removed at the office during your follow-up visit. °8. ACTIVITIES:  You may resume  regular daily activities (gradually increasing) beginning the next day.  Wearing a good support bra or sports bra (or the breast binder) minimizes pain and swelling.  You may have sexual intercourse when it is comfortable. °a. You may drive when you no longer are taking prescription pain medication, you can comfortably wear a seatbelt, and you can safely maneuver your car and apply brakes. °b. RETURN TO WORK:  __________1 week_______________ °9. You should see your doctor in the office for a follow-up appointment approximately two weeks after your surgery.  Your doctor’s nurse will typically make your follow-up appointment when she calls you with your pathology report.  Expect your pathology report 2-3 business days after your surgery.  You may call to check if you do not hear from us after three days. ° ° °WHEN TO CALL YOUR DOCTOR: °1. Fever over 101.0 °2. Nausea and/or vomiting. °3. Extreme swelling or bruising. °4. Continued bleeding from incision. °5. Increased pain, redness, or drainage from the incision. ° °The clinic staff is available to answer your questions during regular business hours.  Please don’t hesitate to call and ask to speak to one of the nurses for clinical concerns.  If you have a medical emergency, go to the nearest emergency room or call 911.  A surgeon from [facility] is always on call at the hospital. ° °For further questions, please visit [email or web address]  ° °

## 2013-03-20 NOTE — Transfer of Care (Signed)
Immediate Anesthesia Transfer of Care Note  Patient: Jaime Murillo  Procedure(s) Performed: Procedure(s): RIGHT SKIN SPARING TOTAL MASTECTOMY (Right) IMMEDIATE RIGHT BREAST RECONSTRUCTION WITH PLACEMENT OF TISSUE EXPANDER AND FLEX HD (ACELLULAR HYDRATED DERMIS) (Right)  Patient Location: PACU  Anesthesia Type:General  Level of Consciousness: oriented, patient cooperative and responds to stimulation  Airway & Oxygen Therapy: Patient Spontanous Breathing and Patient connected to nasal cannula oxygen  Post-op Assessment: Report given to PACU RN, Post -op Vital signs reviewed and stable and Patient moving all extremities  Post vital signs: Reviewed and stable  Complications: No apparent anesthesia complications  Pt with 28.3 axillary temp. Full body Bair Hugger warming on patient.

## 2013-03-20 NOTE — Anesthesia Postprocedure Evaluation (Signed)
  Anesthesia Post-op Note  Patient: Jaime Murillo  Procedure(s) Performed: Procedure(s): RIGHT SKIN SPARING TOTAL MASTECTOMY (Right) IMMEDIATE RIGHT BREAST RECONSTRUCTION WITH PLACEMENT OF TISSUE EXPANDER AND FLEX HD (ACELLULAR HYDRATED DERMIS) (Right)  Patient Location: PACU  Anesthesia Type:General  Level of Consciousness: awake  Airway and Oxygen Therapy: Patient Spontanous Breathing  Post-op Pain: mild  Post-op Assessment: Post-op Vital signs reviewed  Post-op Vital Signs: Reviewed  Complications: No apparent anesthesia complications

## 2013-03-20 NOTE — Progress Notes (Signed)
Pt. Vomited clear liquid lunch tray.  IV zofran given.  Will continue to monitor. Syliva Overman

## 2013-03-20 NOTE — H&P (Signed)
Jaime Murillo is an 53 y.o. female.   Chief Complaint: Right breast cancer HPI:  Pt is a 53 yo F with right breast cancer who has been undergoing neoadjuvant chemotherapy.  She had sentinel lymph node biopsy in order to see if she was candidate for immediate reconstruction.  She did have negative LN.  She has small breast size, and is not as good candidate for lumpectomy.    Past Medical History  Diagnosis Date  . HSV infection     takes Valtrex daily  . Urinary frequency   . Breast cancer 2014    triple negative  . Nausea     takes Compazine and Zofran as needed  . Anemia     takes Ferrous Sulfate daily  . Depression     takes Cymbalta daily  . Insomnia     takes Ambien nightly as needed  . Hypertension     takes HCTZ daily  . Dizziness     r/t meds  . Neuropathy     takes Lyrica daily and alternates with Gabapentin  . Joint pain   . Nocturia   . History of blood transfusion     no abnormal reaction noted  . Anxiety     Past Surgical History  Procedure Laterality Date  . Tubal ligation  2003    LTL  . Portacath placement N/A 07/06/2012    Procedure: INSERTION PORT-A-CATH;  Surgeon: Jaime Klein, MD;  Location: [place];  Service: General;  Laterality: N/A;  . Port-a-cath removal Left 01/12/2013    Procedure: REMOVAL PORT-A-CATH;  Surgeon: Jaime Klein, MD;  Location: [place];  Service: General;  Laterality: Left;  . Breast lumpectomy Right 01/12/2013    Procedure: RIGHT SENTINEL LYMPH  NODE BIOPSY;  Surgeon: Jaime Klein, MD;  Location: [place];  Service: General;  Laterality: Right;  RIGHT SENTINEL LYMPH NODE BIOPSY  . Colonoscopy      Family History  Problem Relation Age of Onset  . Heart disease Mother   . Hypertension Father   . Prostate cancer Father 62  . Hypertension Sister   . Ovarian cancer Paternal Grandmother     died in her 37s  . Stomach cancer Maternal Grandmother 7  . Cancer Cousin     paternal cousin with an  unknown form of cancer   Social History:  reports that she has never smoked. She has never used smokeless tobacco. She reports that she does not drink alcohol or use illicit drugs.  Allergies: No Known Allergies  Medications Prior to Admission  Medication Sig Dispense Refill  . acetaminophen (TYLENOL) 500 MG tablet Take 1,000 mg by mouth every 6 (six) hours as needed for mild pain.      . B Complex-C (SUPER B COMPLEX PO) Take 1 tablet by mouth daily.       . ferrous sulfate 325 (65 FE) MG tablet Take 325 mg by mouth daily with breakfast.      . hydrochlorothiazide (HYDRODIURIL) 25 MG tablet Take 1 tablet (25 mg total) by mouth daily.  30 tablet  11  . ibuprofen (ADVIL,MOTRIN) 200 MG tablet Take 400 mg by mouth every 6 (six) hours as needed for moderate pain.      . Multiple Vitamin (MULTIVITAMIN) tablet Take 1 tablet by mouth daily.      . ondansetron (ZOFRAN) 8 MG tablet Take 1 tablet (8 mg total) by mouth every 8 (eight) hours as needed for nausea (Take 1 pill  by mouth twice a day as needed  for nausea and vomitting starting 3rd day after chemo).  20 tablet  3  . oxyCODONE-acetaminophen (PERCOCET/ROXICET) 5-325 MG per tablet Take 1-2 tablets by mouth every 4 (four) hours as needed for moderate pain or severe pain.      . pregabalin (LYRICA) 50 MG capsule Take 2 capsules (100 mg total) by mouth 3 (three) times daily.  180 capsule  5  . prochlorperazine (COMPAZINE) 25 MG suppository Place 25 mg rectally every 12 (twelve) hours as needed for nausea (as needed for nausea and vomitting).      . valACYclovir (VALTREX) 500 MG tablet Take 500 mg by mouth daily.      Marland Kitchen VITAMIN E COMPLEX PO Take 1 tablet by mouth daily.       Marland Kitchen zolpidem (AMBIEN) 5 MG tablet Take 1 tablet (5 mg total) by mouth at bedtime as needed for sleep.  30 tablet  5  . DULoxetine (CYMBALTA) 30 MG capsule One po qday xone week, then 2 tabs po qam  60 capsule  12  . gabapentin (NEURONTIN) 300 MG capsule Take 300 mg by mouth 3  (three) times daily.        No results found for this or any previous visit (from the past 48 hour(s)). No results found.  Review of Systems  Constitutional: Negative.   HENT: Negative.   Eyes: Negative.   Respiratory: Negative.   Cardiovascular: Negative.   Gastrointestinal: Negative.   Genitourinary: Negative.   Musculoskeletal: Negative.   Skin: Negative.   Neurological:       Neuropathy from chemo   Endo/Heme/Allergies: Negative.   Psychiatric/Behavioral: The patient is nervous/anxious.     There were no vitals taken for this visit. Physical Exam   Assessment/Plan Right breast cancer Right mastectomy with reconstruction.  Reviewed risks, benefits with patient.   Patient wishes to proceed.     Jaime Murillo 03/20/2013, 6:23 AM

## 2013-03-20 NOTE — Op Note (Signed)
Operative Note   DATE OF OPERATION: 03/20/13   LOCATION: [facility] OR -observation  SURGICAL DIVISION: Plastic Surgery   PREOPERATIVE DIAGNOSES: Right breast cancer   POSTOPERATIVE DIAGNOSES: same   PROCEDURE: 1. Right breast reconstruction with tissue expander 2. Application of acellular dermis (Alloderm) for breast reconstruction, total 100 cm2   SURGEON: Irene Limbo MD MBA   ASSISTANT: none   ANESTHESIA: General.   EBL: 50 ml, all procedures   COMPLICATIONS: None immediate.   INDICATIONS FOR PROCEDURE  The patient, is here for right areolar sparing mastectomy and immediate reconstruction. She has completed neoadjuvant chemotherapy and had a negative SLN.  FINDINGS: Right breast: Mentor style 9200 tissue expander, 211ml, initial fill volume 130 ml. R side SN [id number]  DESCRIPTION OF PROCEDURE:  The patient was marked in the preoperative area including breast meridians, sternal notch, chest midline, anterior axillary lines and inframammary folds. The patient was taken to the operating room. SCDs were placed and IV antibiotics were given. The patient's operative site was prepped and draped in a sterile fashion. A time out was performed and all information was confirmed to be correct. Following completion of mastectomy, the cavity was irrigated with saline and hemostasis obtained. The inferior insertions of pectoralis major muscle were divided and submuscular dissection completed. Acelluar dermis was perforated and sewn to inferior border of pectoralis major with running 3-0 vicryl. A 19 Fr drain was placed in subcutaneous position and secured to skin with 2-0 nylon. The cavity was irrigated with solution containing Ancef, genatmicin, and bacitracin. The tissue expander was prepared and placed in submuscular position. The expander was secured to chest wall with a 3-0 vicryl. The inferior border of the acellular dermis was inset to inframammary foldand laterally border was  left free. The incision was closed with 3-0 vicryl in fascial layer and 4-0 vicryl in dermis. Skin closure completed with 4-0 monocryl subcuticular and Dermabond. The areola was closed with 3-0 vicyl indermis and running simple skin stitch of 4-0 monocryl. Demabond applied here as well. The ports were accessed and filled to 130 ml. The patient was brought to sitting position and skin envelope redraped so that areola was located similar distance from chest midline and sternal notch as compared with opposite breast. Dry dressing and breast binder applied.  The patient was allowed to wake from anesthesia, extubated and taken to the recovery room in satisfactory condition.   SPECIMENS: none   DRAINS: 19 Fr in subcutaneous position    Irene Limbo, MD [facility] [phone]

## 2013-03-20 NOTE — Anesthesia Preprocedure Evaluation (Addendum)
Anesthesia Evaluation  Patient identified by MRN, date of birth, ID band Patient awake    Reviewed: Allergy & Precautions, H&P , NPO status , Patient's Chart, lab work & pertinent test results  History of Anesthesia Complications Negative for: history of anesthetic complications  Airway Mallampati: II TM Distance: >3 FB Neck ROM: Full    Dental  (+) Partial Upper, Teeth Intact, Dental Advisory Given   Pulmonary neg pulmonary ROS,  breath sounds clear to auscultation        Cardiovascular hypertension, Rhythm:Regular Rate:Normal     Neuro/Psych Anxiety Depression Peripheral neuropathy---feet and hands negative neurological ROS     GI/Hepatic negative GI ROS, Neg liver ROS,   Endo/Other  negative endocrine ROS  Renal/GU negative Renal ROS     Musculoskeletal negative musculoskeletal ROS (+)   Abdominal   Peds  Hematology  (+) anemia ,   Anesthesia Other Findings   Reproductive/Obstetrics                          Anesthesia Physical Anesthesia Plan  ASA: II  Anesthesia Plan: General   Post-op Pain Management:    Induction:   Airway Management Planned: Oral ETT  Additional Equipment:   Intra-op Plan:   Post-operative Plan: Extubation in OR  Informed Consent: I have reviewed the patients History and Physical, chart, labs and discussed the procedure including the risks, benefits and alternatives for the proposed anesthesia with the patient or authorized representative who has indicated his/her understanding and acceptance.   Dental advisory given  Plan Discussed with: CRNA, Anesthesiologist and Surgeon  Anesthesia Plan Comments:         Anesthesia Quick Evaluation

## 2013-03-20 NOTE — Op Note (Signed)
Right Areolar sparing Mastectomy   Indications: This patient presents with history of Right breast cancer with pathologically negative axillary sentinel lymph node   Pre-operative Diagnosis: right breast cancer, clinical T1cpN0, s/p neoadjuvant chemotherapy  Post-operative Diagnosis: right breast cancer, same  Surgeon: Stark Klein   Assist:  Irene Limbo  Anesthesia: General endotracheal anesthesia and Local anesthesia 0.25.% bupivacaine, with epinephrine  ASA Class: 2  Procedure Details  The patient was seen in the Holding Room. The risks, benefits, complications, treatment options, and expected outcomes were discussed with the patient. The possibilities of reaction to medication, pulmonary aspiration, bleeding, infection, the need for additional procedures, failure to diagnose a condition, and creating a complication requiring transfusion or operation were discussed with the patient. The patient concurred with the proposed plan, giving informed consent.  The site of surgery properly noted/marked. The patient was taken to Operating Room # 2, identified, and the procedure verified as Right skin sparing mastectomy followed by reconstruction. A Time Out was held and the above information confirmed.    After induction of anesthesia, the right arm, breast, and chest were prepped and draped in standard fashion.  The borders of the breast were identified and marked.  The incisions of the breast was drawn out. An inframammary incision was drawn out.  The incision was made with the #10 blade.  The marcaine/saline mixture was infiltrated into the superior flap.  Mastectomy hooks were used to provide elevation of the skin edges, and the Plasmablade was used to create the mastectomy flaps.  The lighted retractors were also used.  The dissection was taken to the fascia of the pectoralis major.  The superior flap was taken medially to the lateral sternal border, superiorly to the inferior border of the  clavicle.  Laterally, the breast was taken to the border of the latissimus.  The breast was taken off including the pectoralis fascia and the axillary tail marked.    The wound was irrigated. Hemostasis was achieved with cautery.      The patient was left with Dr. Iran Planas for reconstruction.  Findings: grossly clear surgical margins  Estimated Blood Loss:  less than 25 mL                Specimens: R breast          Complications:  None; patient tolerated the procedure well.         Disposition: PACU - hemodynamically stable.         Condition: stable

## 2013-03-21 ENCOUNTER — Encounter (HOSPITAL_COMMUNITY): Payer: Self-pay | Admitting: General Practice

## 2013-03-21 LAB — BASIC METABOLIC PANEL
BUN: 6 mg/dL (ref 6–23)
CO2: 26 mEq/L (ref 19–32)
Calcium: 8.7 mg/dL (ref 8.4–10.5)
Chloride: 102 mEq/L (ref 96–112)
Creatinine, Ser: 0.63 mg/dL (ref 0.50–1.10)
Glucose, Bld: 246 mg/dL — ABNORMAL HIGH (ref 70–99)
POTASSIUM: 4.6 meq/L (ref 3.7–5.3)
SODIUM: 137 meq/L (ref 137–147)

## 2013-03-21 LAB — CBC
HCT: 27.5 % — ABNORMAL LOW (ref 36.0–46.0)
Hemoglobin: 10 g/dL — ABNORMAL LOW (ref 12.0–15.0)
MCH: 32.2 pg (ref 26.0–34.0)
MCHC: 36.4 g/dL — AB (ref 30.0–36.0)
MCV: 88.4 fL (ref 78.0–100.0)
Platelets: 163 10*3/uL (ref 150–400)
RBC: 3.11 MIL/uL — ABNORMAL LOW (ref 3.87–5.11)
RDW: 11.8 % (ref 11.5–15.5)
WBC: 6.2 10*3/uL (ref 4.0–10.5)

## 2013-03-21 MED ORDER — WHITE PETROLATUM GEL
Status: AC
  Administered 2013-03-21
  Filled 2013-03-21: qty 5

## 2013-03-21 NOTE — Progress Notes (Signed)
1 Day Post-Op  Subjective: Nausea/vomiting.  Needing IV pain meds.    Objective: Vital signs in last 24 hours: Temp:  [97.5 F (36.4 C)-98 F (36.7 C)] 98 F (36.7 C) (02/25 1443) Pulse Rate:  [62-72] 68 (02/25 1443) Resp:  [12-20] 18 (02/25 1443) BP: (103-137)/(58-88) 127/71 mmHg (02/25 1443) SpO2:  [97 %-100 %] 100 % (02/25 1443) Last BM Date: 03/19/13  Intake/Output from previous day: 02/24 0701 - 02/25 0700 In: 3173.3 [P.O.:720; I.V.:2353.3; IV Piggyback:100] Out: 415 [Drains:365; Blood:50] Intake/Output this shift: Total I/O In: 480 [P.O.:480] Out: 105 [Drains:105]  General appearance: alert, cooperative and no distress Chest wall: right sided chest wall tenderness, drain serosang GI: soft, non-tender; bowel sounds normal; no masses,  no organomegaly Extremities: extremities normal, atraumatic, no cyanosis or edema  Lab Results:   Recent Labs  03/20/13 1310 03/21/13 0337  WBC 7.5 6.2  HGB 12.1 10.0*  HCT 34.3* 27.5*  PLT 175 163   BMET  Recent Labs  03/20/13 1310 03/21/13 0337  NA  --  137  K  --  4.6  CL  --  102  CO2  --  26  GLUCOSE  --  246*  BUN  --  6  CREATININE 0.60 0.63  CALCIUM  --  8.7   PT/INR No results found for this basename: LABPROT, INR,  in the last 72 hours ABG No results found for this basename: PHART, PCO2, PO2, HCO3,  in the last 72 hours  Studies/Results: No results found.  Anti-infectives: Anti-infectives   Start     Dose/Rate Route Frequency Ordered Stop   03/20/13 1400  valACYclovir (VALTREX) tablet 500 mg     500 mg Oral Daily 03/20/13 1248     03/20/13 1400  ceFAZolin (ANCEF) IVPB 1 g/50 mL premix     1 g 100 mL/hr over 30 Minutes Intravenous 3 times per day 03/20/13 1248     03/20/13 0944  bacitracin 50,000 Units, gentamicin (GARAMYCIN) 80 mg, ceFAZolin (ANCEF) 1 g in sodium chloride 0.9 % 1,000 mL  Status:  Discontinued       As needed 03/20/13 0944 03/20/13 1039   03/20/13 0730  bacitracin 50,000 Units,  gentamicin (GARAMYCIN) 80 mg, ceFAZolin (ANCEF) 1 g in sodium chloride 0.9 % 1,000 mL  Status:  Discontinued      Irrigation Once 03/20/13 0726 03/20/13 1248   03/20/13 0600  ceFAZolin (ANCEF) IVPB 2 g/50 mL premix     2 g 100 mL/hr over 30 Minutes Intravenous On call to O.R. 03/19/13 1436 03/20/13 0736      Assessment/Plan: s/p Procedure(s): RIGHT SKIN SPARING TOTAL MASTECTOMY (Right) IMMEDIATE RIGHT BREAST RECONSTRUCTION WITH PLACEMENT OF TISSUE EXPANDER AND FLEX HD (ACELLULAR HYDRATED DERMIS) (Right) PAS Advance diet see how she does over the day Will need to see if dizziness resolves.   Also will need to see if she can tolerate oral pain meds  May need to stay additional day.   LOS: 1 day    [facility] 03/21/2013

## 2013-03-21 NOTE — Progress Notes (Signed)
Pt. Ambulated 2022ft on unit.  Tolerated well.  Will continue to monitor. Syliva Overman

## 2013-03-21 NOTE — Progress Notes (Addendum)
POD# 1 R areolar sparing mastectomy, TE, acellular dermis  Had emesis post op and continues with dizziness each time she ambulates, can sit up in bed without problem  Temp:  [95.5 F (35.3 C)-97.9 F (36.6 C)] 97.6 F (36.4 C) (02/25 0544) Pulse Rate:  [54-86] 71 (02/25 0544) Resp:  [10-28] 16 (02/25 0544) BP: (103-164)/(58-97) 137/75 mmHg (02/25 0544) SpO2:  [97 %-100 %] 100 % (02/25 0544)  JP 365  PE Alert NAD Dressing/Tegaderm dry, skin flaps viable Edema skin but no hematoma, NTTP JP serosang  A/P Cont IVF, cont to try and ambulate. Pt states she does not want Ambien, gabapentin and lyrica as gives her bad dreams, anxiety- will d/c these   Irene Limbo, MD [facility] Plastic & Reconstructive Surgery [phone]

## 2013-03-22 LAB — CBC
HCT: 27.3 % — ABNORMAL LOW (ref 36.0–46.0)
HEMOGLOBIN: 9.6 g/dL — AB (ref 12.0–15.0)
MCH: 31.8 pg (ref 26.0–34.0)
MCHC: 35.2 g/dL (ref 30.0–36.0)
MCV: 90.4 fL (ref 78.0–100.0)
Platelets: 175 10*3/uL (ref 150–400)
RBC: 3.02 MIL/uL — AB (ref 3.87–5.11)
RDW: 12.1 % (ref 11.5–15.5)
WBC: 3.6 10*3/uL — ABNORMAL LOW (ref 4.0–10.5)

## 2013-03-22 LAB — BASIC METABOLIC PANEL
BUN: 6 mg/dL (ref 6–23)
CO2: 27 meq/L (ref 19–32)
Calcium: 8.9 mg/dL (ref 8.4–10.5)
Chloride: 104 mEq/L (ref 96–112)
Creatinine, Ser: 0.65 mg/dL (ref 0.50–1.10)
GFR calc Af Amer: >90 mL/min (ref >90)
GFR calc non Af Amer: >90 mL/min (ref >90)
GLUCOSE: 100 mg/dL — AB (ref 70–99)
Potassium: 4.2 mEq/L (ref 3.7–5.3)
SODIUM: 140 meq/L (ref 137–147)

## 2013-03-22 MED ORDER — SULFAMETHOXAZOLE-TMP DS 800-160 MG PO TABS: 1.0000 | ORAL_TABLET | Freq: Two times a day (BID) | ORAL | Status: DC

## 2013-03-22 MED ORDER — SENNOSIDES-DOCUSATE SODIUM 8.6-50 MG PO TABS
1.0000 | ORAL_TABLET | Freq: Every day | ORAL | Status: DC
  Administered 2013-03-22 – 2013-03-25 (×4): 1 via ORAL
  Filled 2013-03-22 (×4): qty 1

## 2013-03-22 MED ORDER — BACLOFEN 5 MG HALF TABLET
5.0000 mg | ORAL_TABLET | Freq: Three times a day (TID) | ORAL | Status: DC
  Administered 2013-03-22 (×3): 5 mg via ORAL
  Filled 2013-03-22 (×6): qty 1

## 2013-03-22 MED ORDER — OXYCODONE HCL 5 MG PO TABS
5.0000 mg | ORAL_TABLET | ORAL | Status: DC | PRN
  Administered 2013-03-22 (×3): 10 mg via ORAL
  Administered 2013-03-23: 15 mg via ORAL
  Administered 2013-03-23: 10 mg via ORAL
  Administered 2013-03-23 (×2): 15 mg via ORAL
  Administered 2013-03-23: 5 mg via ORAL
  Administered 2013-03-24 – 2013-03-25 (×7): 15 mg via ORAL
  Filled 2013-03-22: qty 2
  Filled 2013-03-22 (×2): qty 3
  Filled 2013-03-22: qty 2
  Filled 2013-03-22 (×2): qty 3
  Filled 2013-03-22 (×2): qty 2
  Filled 2013-03-22 (×5): qty 3
  Filled 2013-03-22: qty 1
  Filled 2013-03-22 (×2): qty 3
  Filled 2013-03-22: qty 1
  Filled 2013-03-22: qty 2

## 2013-03-22 MED ORDER — MORPHINE SULFATE 2 MG/ML IJ SOLN: 1.0000 mg | INTRAMUSCULAR | Status: DC | PRN

## 2013-03-22 MED ORDER — ENOXAPARIN SODIUM 40 MG/0.4ML ~~LOC~~ SOLN
40.0000 mg | SUBCUTANEOUS | Status: DC
  Administered 2013-03-22 – 2013-03-25 (×4): 40 mg via SUBCUTANEOUS
  Filled 2013-03-22 (×4): qty 0.4

## 2013-03-22 NOTE — Progress Notes (Signed)
POD# 1 R areolar sparing mastectomy, TE, acellular dermis  No further nausea, continued episodes dizziness   Temp:  [97.7 F (36.5 C)-98.2 F (36.8 C)] 97.7 F (36.5 C) (02/26 0609) Pulse Rate:  [65-100] 76 (02/26 0609) Resp:  [18] 18 (02/26 0609) BP: (115-131)/(71-88) 115/77 mmHg (02/26 0609) SpO2:  [96 %-100 %] 96 % (02/26 0609)  JP 260  PE  Dressing/Tegaderm dry, skin flaps viable Chest flat  A/P Transition to oral pain medication. Ok to shower Bowel regimen  Irene Limbo, MD [facility] Plastic & Reconstructive Surgery [phone]

## 2013-03-22 NOTE — Progress Notes (Signed)
Patient ID: Jaime Murillo, female   DOB: [birth year], 53 y.o.   MRN: [id number] 2 Days Post-Op  Subjective: Continued to have dizziness during day.  Also required multiple doses of IV morphine.  Required IV morphine this AM as well.     Objective: Vital signs in last 24 hours: Temp:  [97.9 F (36.6 C)-98.2 F (36.8 C)] 98.1 F (36.7 C) (02/25 2106) Pulse Rate:  [65-100] 65 (02/25 2106) Resp:  [18] 18 (02/25 2106) BP: (121-131)/(71-88) 121/71 mmHg (02/25 2106) SpO2:  [98 %-100 %] 100 % (02/25 2106) Last BM Date: 03/19/13  Intake/Output from previous day: 02/25 0701 - 02/26 0700 In: 3090 [P.O.:480; I.V.:2610] Out: 195 [Drains:195] Intake/Output this shift: Total I/O In: 1266.7 [I.V.:1266.7] Out: 90 [Drains:90]  General appearance: alert, cooperative and no distress Chest wall: right sided chest wall tenderness, drain serosang GI: soft, non-tender; bowel sounds normal; no masses,  no organomegaly Extremities: extremities normal, atraumatic, no cyanosis or edema  Lab Results:   Recent Labs  03/20/13 1310 03/21/13 0337  WBC 7.5 6.2  HGB 12.1 10.0*  HCT 34.3* 27.5*  PLT 175 163   BMET  Recent Labs  03/20/13 1310 03/21/13 0337  NA  --  137  K  --  4.6  CL  --  102  CO2  --  26  GLUCOSE  --  246*  BUN  --  6  CREATININE 0.60 0.63  CALCIUM  --  8.7   PT/INR No results found for this basename: LABPROT, INR,  in the last 72 hours ABG No results found for this basename: PHART, PCO2, PO2, HCO3,  in the last 72 hours  Studies/Results: No results found.  Anti-infectives: Anti-infectives   Start     Dose/Rate Route Frequency Ordered Stop   03/20/13 1400  valACYclovir (VALTREX) tablet 500 mg     500 mg Oral Daily 03/20/13 1248     03/20/13 1400  ceFAZolin (ANCEF) IVPB 1 g/50 mL premix     1 g 100 mL/hr over 30 Minutes Intravenous 3 times per day 03/20/13 1248     03/20/13 0944  bacitracin 50,000 Units, gentamicin (GARAMYCIN) 80 mg, ceFAZolin (ANCEF) 1 g in sodium  chloride 0.9 % 1,000 mL  Status:  Discontinued       As needed 03/20/13 0944 03/20/13 1039   03/20/13 0730  bacitracin 50,000 Units, gentamicin (GARAMYCIN) 80 mg, ceFAZolin (ANCEF) 1 g in sodium chloride 0.9 % 1,000 mL  Status:  Discontinued      Irrigation Once 03/20/13 0726 03/20/13 1248   03/20/13 0600  ceFAZolin (ANCEF) IVPB 2 g/50 mL premix     2 g 100 mL/hr over 30 Minutes Intravenous On call to O.R. 03/19/13 1436 03/20/13 0736      Assessment/Plan: s/p Procedure(s): RIGHT SKIN SPARING TOTAL MASTECTOMY (Right) IMMEDIATE RIGHT BREAST RECONSTRUCTION WITH PLACEMENT OF TISSUE EXPANDER AND FLEX HD (ACELLULAR HYDRATED DERMIS) (Right)  PAS Diet as tolerated.   see how she does over the day Discharge when pain control better Recheck CBC this AM (pending).   HCT stable from yesterday Saline lock IV.   Add baclofen Increase oxycodone   LOS: 2 days    [facility] 03/22/2013

## 2013-03-23 ENCOUNTER — Inpatient Hospital Stay (HOSPITAL_COMMUNITY): Payer: Medicaid Other

## 2013-03-23 MED ORDER — SCOPOLAMINE 1 MG/3DAYS TD PT72
1.0000 | MEDICATED_PATCH | TRANSDERMAL | Status: DC
  Administered 2013-03-24: 1.5 mg via TRANSDERMAL
  Filled 2013-03-23 (×2): qty 1

## 2013-03-23 MED ORDER — BISACODYL 10 MG RE SUPP: 10.0000 mg | Freq: Every day | RECTAL | Status: DC | PRN

## 2013-03-23 MED ORDER — POLYETHYLENE GLYCOL 3350 17 G PO PACK
17.0000 g | PACK | Freq: Two times a day (BID) | ORAL | Status: DC
  Administered 2013-03-23 – 2013-03-25 (×4): 17 g via ORAL
  Filled 2013-03-23 (×6): qty 1

## 2013-03-23 MED ORDER — BACLOFEN 10 MG PO TABS
10.0000 mg | ORAL_TABLET | Freq: Three times a day (TID) | ORAL | Status: DC | PRN
  Administered 2013-03-24 – 2013-03-25 (×2): 10 mg via ORAL
  Filled 2013-03-23 (×2): qty 1

## 2013-03-23 MED ORDER — LACTATED RINGERS IV BOLUS (SEPSIS)
1000.0000 mL | Freq: Once | INTRAVENOUS | Status: AC
  Administered 2013-03-23: 1000 mL via INTRAVENOUS

## 2013-03-23 MED ORDER — SULFAMETHOXAZOLE-TMP DS 800-160 MG PO TABS
1.0000 | ORAL_TABLET | Freq: Two times a day (BID) | ORAL | Status: DC
  Administered 2013-03-23 – 2013-03-25 (×5): 1 via ORAL
  Filled 2013-03-23 (×8): qty 1

## 2013-03-23 NOTE — Progress Notes (Addendum)
Patient still c/o being dizzy and having double vision at times. Patient does call for assistance when ambulating to the bathroom. Patient's gait unsteady at times when ambulating.

## 2013-03-23 NOTE — Progress Notes (Signed)
Patient ID: Jaime Murillo, female   DOB: 09/23/1960, 53 y.o.   MRN: [id number] 3 Days Post-Op  Subjective: Pt continues to complain of dizziness.  Noted to have unsteady gait by night nurse.   Pain improving.  Still no BM.    Objective: Vital signs in last 24 hours: Temp:  [98.2 F (36.8 C)-98.3 F (36.8 C)] 98.3 F (36.8 C) (02/27 0445) Pulse Rate:  [72-84] 84 (02/27 0445) Resp:  [18] 18 (02/27 0445) BP: (128-135)/(69-74) 131/69 mmHg (02/27 0445) SpO2:  [98 %-100 %] 100 % (02/27 0445) Weight:  [124 lb 1.6 oz (56.291 kg)] 124 lb 1.6 oz (56.291 kg) (02/26 1500) Last BM Date: 03/19/13  Intake/Output from previous day: 02/26 0701 - 02/27 0700 In: 1050 [P.O.:700; IV Piggyback:350] Out: 77 [Drains:77] Intake/Output this shift:    General appearance: alert, cooperative and no distress Chest wall: right sided chest wall tenderness, drain serosang GI: soft, non-tender Extremities: extremities normal, atraumatic, no cyanosis or edema  Lab Results:   Recent Labs  03/21/13 0337 03/22/13 0615  WBC 6.2 3.6*  HGB 10.0* 9.6*  HCT 27.5* 27.3*  PLT 163 175   BMET  Recent Labs  03/21/13 0337 03/22/13 0615  NA 137 140  K 4.6 4.2  CL 102 104  CO2 26 27  GLUCOSE 246* 100*  BUN 6 6  CREATININE 0.63 0.65  CALCIUM 8.7 8.9   PT/INR No results found for this basename: LABPROT, INR,  in the last 72 hours ABG No results found for this basename: PHART, PCO2, PO2, HCO3,  in the last 72 hours  Studies/Results: No results found.  Anti-infectives: Anti-infectives   Start     Dose/Rate Route Frequency Ordered Stop   03/22/13 0000  sulfamethoxazole-trimethoprim (BACTRIM DS) 800-160 MG per tablet     1 tablet Oral 2 times daily 03/22/13 0931     03/20/13 1400  valACYclovir (VALTREX) tablet 500 mg     500 mg Oral Daily 03/20/13 1248     03/20/13 1400  ceFAZolin (ANCEF) IVPB 1 g/50 mL premix     1 g 100 mL/hr over 30 Minutes Intravenous 3 times per day 03/20/13 1248     03/20/13  0944  bacitracin 50,000 Units, gentamicin (GARAMYCIN) 80 mg, ceFAZolin (ANCEF) 1 g in sodium chloride 0.9 % 1,000 mL  Status:  Discontinued       As needed 03/20/13 0944 03/20/13 1039   03/20/13 0730  bacitracin 50,000 Units, gentamicin (GARAMYCIN) 80 mg, ceFAZolin (ANCEF) 1 g in sodium chloride 0.9 % 1,000 mL  Status:  Discontinued      Irrigation Once 03/20/13 0726 03/20/13 1248   03/20/13 0600  ceFAZolin (ANCEF) IVPB 2 g/50 mL premix     2 g 100 mL/hr over 30 Minutes Intravenous On call to O.R. 03/19/13 1436 03/20/13 0736      Assessment/Plan: s/p Procedure(s): RIGHT SKIN SPARING TOTAL MASTECTOMY (Right) IMMEDIATE RIGHT BREAST RECONSTRUCTION WITH PLACEMENT OF TISSUE EXPANDER AND FLEX HD (ACELLULAR HYDRATED DERMIS) (Right)  PAS Diet as tolerated.   PT consult for home safety Discharge when pain control better HCT was stable.   Add miralax and suppository Give IVF bolus to see if helps dizziness.   Encouraged minimizing narcotics.     LOS: 3 days    [facility] 03/23/2013

## 2013-03-23 NOTE — Progress Notes (Signed)
Physical Therapy Treatment Patient Details Name: Jaime Murillo MRN: [id number] DOB: Mar 23, 1960 Today's Date: 03/23/2013 Time: 1340-1403 PT Time Calculation (min): 23 min  PT Assessment / Plan / Recommendation  History of Present Illness Pt is a 53 yo F with right breast cancer who has been undergoing neoadjuvant chemotherapy.  She had sentinel lymph node biopsy in order to see if she was candidate for immediate reconstruction.  She did have negative LN.  She has small breast size, and is not as good candidate for lumpectomy   PT Comments   Patient successful walking in hallway after nausea meds and cues for visual compensatory strategy to help symptoms.  She will need help following discharge and plans to continue cancer rehab as much as allowed per insurance and see if she can get financial assist.  Fear that HHPT would exhaust any covered visits.  Hopes she is much better tomorrow and can just go home.  Follow Up Recommendations  Home health PT;Supervision/Assistance - 24 hour (depending on if patient can get financial assist for cancer rehab.)     Does the patient have the potential to tolerate intense rehabilitation   N/A  Barriers to Discharge Decreased caregiver support would likely only have intermitted help from family and friends; son staying with different members of his basketball team while patient in hospital    Equipment Recommendations  Rolling walker with 5" wheels    Recommendations for Other Services  None  Frequency Min 3X/week   Progress towards PT Goals Progress towards PT goals: Progressing toward goals  Plan Current plan remains appropriate    Precautions / Restrictions Precautions Precautions: Fall   Pertinent Vitals/Pain No current complaints    Mobility  Bed Mobility Overal bed mobility: Modified Independent Transfers Overall transfer level: Needs assistance Equipment used: None Transfers: Sit to/from Stand Sit to Stand: Min guard General transfer  comment: assist for safety Ambulation/Gait Ambulation/Gait assistance: Min guard Ambulation Distance (Feet): 180 Feet Assistive device: None Gait Pattern/deviations: Step-through pattern;Decreased stride length General Gait Details: cues for visual target while walking to diminish symptoms, noted worse after turning around to return to room, despite cues to slowly step and turn as gaining new visual target.     Exercises  Educated on visual compensatory strategies to help with symptoms.  Discussed issue with HHPT and Medicaid.  Patient wishes to wait and hope she is better tomorrow due to needs to save few visits allowed for cancer rehab.  Encouraged upright activitiy as much as tolerated to allow habituation.    PT Diagnosis: Abnormality of gait;Acute pain;Other (comment) (Vertigo)  PT Problem List: Decreased activity tolerance;Decreased balance;Decreased knowledge of use of DME;Decreased coordination;Decreased mobility;Pain;Decreased range of motion PT Treatment Interventions: DME instruction;Therapeutic exercise;Balance training;Gait training;Stair training;Neuromuscular re-education;Functional mobility training;Therapeutic activities;Patient/family education   PT Goals (current goals can now be found in the care plan section) Acute Rehab PT Goals Patient Stated Goal: To walk without dizziness PT Goal Formulation: With patient Time For Goal Achievement: 04/06/13 Potential to Achieve Goals: Good  Visit Information  Last PT Received On: 03/23/13 Assistance Needed: +1 History of Present Illness: Pt is a 53 yo F with right breast cancer who has been undergoing neoadjuvant chemotherapy.  She had sentinel lymph node biopsy in order to see if she was candidate for immediate reconstruction.  She did have negative LN.  She has small breast size, and is not as good candidate for lumpectomy    Subjective Data  Patient Stated Goal: To walk without dizziness  Cognition   Cognition Arousal/Alertness: Awake/alert Behavior During Therapy: WFL for tasks assessed/performed Overall Cognitive Status: Within Functional Limits for tasks assessed    Balance  Balance Overall balance assessment: Needs assistance Sitting balance-Leahy Scale: Good Sitting balance - Comments: able to don socks edge of bed Standing balance-Leahy Scale: Fair Standing balance comment: stands statically without support, but feels imbalance with movement requiring UE assist for safety  End of Session PT - End of Session Equipment Utilized During Treatment: Gait belt Activity Tolerance: Patient limited by fatigue (nausea) Patient left: in chair;with family/visitor present;with call bell/phone within reach   GP     [facility] 03/23/2013, 2:56 PM [place], [place] 03/23/2013

## 2013-03-23 NOTE — Progress Notes (Addendum)
POD# 1 R areolar sparing mastectomy, TE, acellular dermis  Per pt emesis yesterday (not recorded) and emesis with brushing teeth Baclofen helped, no IV morphine over last day   Temp:  [98.2 F (36.8 C)-98.3 F (36.8 C)] 98.3 F (36.8 C) (02/27 0445) Pulse Rate:  [72-84] 84 (02/27 0445) Resp:  [18] 18 (02/27 0445) BP: (128-135)/(69-74) 131/69 mmHg (02/27 0445) SpO2:  [98 %-100 %] 100 % (02/27 0445) Weight:  [56.291 kg (124 lb 1.6 oz)] 56.291 kg (124 lb 1.6 oz) (02/26 1500)  JP 77  Continued dizziness- no tachycardia, BP normal, Hct stable  As Dr. Barry Dienes has noted- IVF bolus pending, PT eval pending Pt denies similar problems with Percocet in past re dizziness and double visio D/c Ancef- switch to Bactrim Hold iron pills for now given constipation and nausea  Irene Limbo, MD [facility] Plastic & Reconstructive Surgery [phone]  ADDENDUM: Spoke with patient re side effect profile of Valtrex- can contribute to current symptoms and she is ok with holding this medication to see if helps

## 2013-03-23 NOTE — Evaluation (Signed)
Physical Therapy Evaluation Patient Details Name: Jaime Murillo MRN: [id number] DOB: December 27, 1960 Today's Date: 03/23/2013 Time: 2751-7001 PT Time Calculation (min): 35 min  PT Assessment / Plan / Recommendation History of Present Illness  Pt is a 53 yo F with right breast cancer who has been undergoing neoadjuvant chemotherapy.  She had sentinel lymph node biopsy in order to see if she was candidate for immediate reconstruction.  She did have negative LN.  She has small breast size, and is not as good candidate for lumpectomy  Clinical Impression  Patient presents with decreased independence with mobility due to nausea and dizziness.  Demonstrates signs concerning for central cause for dizziness (double vision, direction changing nystagmus, impaired VOR cancellation and coordination deficits).  Would recommend further work up for CNS lesion.  Will follow acutely and return later to attempt hallway ambulation after meds improve pain and nausea.  Potentially needs HHPT follow up and walker for safety.  Ideally would like her to have at least initial 24 hour assist available at d/c.    PT Assessment  Patient needs continued PT services    Follow Up Recommendations  Home health PT;Supervision/Assistance - 24 hour (for vestibular rehab)    Does the patient have the potential to tolerate intense rehabilitation    N/A  Barriers to Discharge Decreased caregiver support would likely only have intermitted help from family and friends; son staying with different members of his basketball team while patient in hospital    Equipment Recommendations  Rolling walker with 5" wheels    Recommendations for Other Services   None  Frequency Min 3X/week    Precautions / Restrictions Precautions Precautions: Fall   Pertinent Vitals/Pain 4-5/10 right chest/surgical site      Mobility  Bed Mobility Overal bed mobility: Modified Independent Transfers Overall transfer level: Needs  assistance Equipment used: 1 person hand held assist Transfers: Sit to/from Stand Sit to Stand: Min guard General transfer comment: c/o feeling off balance and dizzy (room spinning and woozy) sitting up on edge of bed Ambulation/Gait Ambulation/Gait assistance: Min guard Ambulation Distance (Feet): 12 Feet Assistive device: 1 person hand held assist Gait Pattern/deviations: Trunk flexed;Decreased stride length General Gait Details: initiated cues for eyes on stationary target for decreased dizziness; patient hesitant and slow due to dizziness/imbalance    Vestibular  Oculomotor: smooth pursuits intact with couple beats of direction changing gaze holding nystagmus; saccades with couple of beats nystagmus with horizontal testing, vertical overall intact (testing difficult due to pt c/o headache with gaze testing) Vestibular: VOR cancellation positive for nystagmus with right head movement; VOR horizontal grossly intact with c/o head turning hurting surgical site so further testing deferred until after nausea and pain meds Subjective: Patient reports dizziness that began following this surgery.  Feels off balance, woozy and with room spinning as well as double vision with vertically imposed images only when up walking.  Also has nausea with symptoms.  Symptoms resolve in supine.  Also reported feeling the floor was higher than normal when sitting on commode in bathroom.   PT Diagnosis: Abnormality of gait;Acute pain;Other (comment) (Vertigo)  PT Problem List: Decreased activity tolerance;Decreased balance;Decreased knowledge of use of DME;Decreased coordination;Decreased mobility;Pain;Decreased range of motion PT Treatment Interventions: DME instruction;Therapeutic exercise;Balance training;Gait training;Stair training;Neuromuscular re-education;Functional mobility training;Therapeutic activities;Patient/family education     PT Goals(Current goals can be found in the care plan section) Acute  Rehab PT Goals Patient Stated Goal: To walk without dizziness PT Goal Formulation: With patient Time For Goal Achievement:  04/06/13 Potential to Achieve Goals: Good  Visit Information  Last PT Received On: 03/23/13 Assistance Needed: +1 History of Present Illness: Pt is a 53 yo F with right breast cancer who has been undergoing neoadjuvant chemotherapy.  She had sentinel lymph node biopsy in order to see if she was candidate for immediate reconstruction.  She did have negative LN.  She has small breast size, and is not as good candidate for lumpectomy       Prior [place] expects to be discharged to:: Private residence Living Arrangements: Children (55 y/o son) Available Help at Discharge: Family;Friend(s);Available PRN/intermittently Type of Home: House Home Access: Stairs to enter [employer] of Steps: 3 Entrance Stairs-Rails: None Home Layout: Two level;Able to live on main level with bedroom/bathroom;Full bath on main level Alternate Level Stairs-Number of Steps: flight Alternate Level Stairs-Rails: Right Home Equipment: None Prior Function Level of Independence: Independent Comments: hasn't driven since taking chemo Communication Communication: No difficulties Dominant Hand: Right    Cognition  Cognition Arousal/Alertness: Awake/alert Behavior During Therapy: WFL for tasks assessed/performed Overall Cognitive Status: Within Functional Limits for tasks assessed    Extremity/Trunk Assessment Upper Extremity Assessment Upper Extremity Assessment: RUE deficits/detail;LUE deficits/detail RUE Deficits / Details: ROM/strength NT; coordination decreased with pronation/supination slowed, grossly intact finger to nose and fingers to thumb (given peripheral neuropathy) RUE Sensation: history of peripheral neuropathy LUE Deficits / Details: ROM/strength NT; coordination decreased with pronation/supination slowed, grossly intact finger to  nose and fingers to thumb (given peripheral neuropathy) LUE Sensation: history of peripheral neuropathy Lower Extremity Assessment Lower Extremity Assessment: LLE deficits/detail;RLE deficits/detail RLE Deficits / Details: General strength and ROM for mobility intact; coordination testing decreased with toe tapping, intact heel to shin RLE Sensation: history of peripheral neuropathy LLE Deficits / Details: General strength and ROM for mobility intact; coordination testing decreased with toe tapping, intact heel to shin LLE Sensation: history of peripheral neuropathy   Balance Balance Overall balance assessment: Needs assistance Sitting balance-Leahy Scale: Good Sitting balance - Comments: able to don socks edge of bed Standing balance-Leahy Scale: Fair Standing balance comment: stands statically without support, but feels imbalance with movement requiring UE assist for safety  End of Session PT - End of Session Activity Tolerance: Patient limited by pain;Other (comment) (nausea) Patient left: in bed  GP     [place] 03/23/2013, 12:14 PM [place], [place] 03/23/2013

## 2013-03-24 MED ORDER — SULFAMETHOXAZOLE-TMP DS 800-160 MG PO TABS: 1.0000 | ORAL_TABLET | Freq: Two times a day (BID) | ORAL | Status: DC

## 2013-03-24 MED ORDER — SENNOSIDES-DOCUSATE SODIUM 8.6-50 MG PO TABS: 1.0000 | ORAL_TABLET | Freq: Every day | ORAL | Status: DC

## 2013-03-24 MED ORDER — BISACODYL 10 MG RE SUPP: 10.0000 mg | Freq: Every day | RECTAL | Status: DC | PRN

## 2013-03-24 MED ORDER — MECLIZINE HCL 25 MG PO TABS
25.0000 mg | ORAL_TABLET | Freq: Two times a day (BID) | ORAL | Status: DC | PRN
  Administered 2013-03-24: 25 mg via ORAL
  Filled 2013-03-24: qty 1

## 2013-03-24 MED ORDER — BACLOFEN 10 MG PO TABS: 10.0000 mg | ORAL_TABLET | Freq: Three times a day (TID) | ORAL | Status: DC | PRN

## 2013-03-24 MED ORDER — POLYETHYLENE GLYCOL 3350 17 G PO PACK: 17.0000 g | PACK | Freq: Two times a day (BID) | ORAL | Status: DC

## 2013-03-24 MED ORDER — OXYCODONE HCL 5 MG PO TABS: 5.0000 mg | ORAL_TABLET | ORAL | Status: DC | PRN

## 2013-03-24 MED ORDER — SORBITOL 70 % SOLN
960.0000 mL | TOPICAL_OIL | Freq: Once | ORAL | Status: AC
  Administered 2013-03-24: 960 mL via RECTAL
  Filled 2013-03-24: qty 240

## 2013-03-24 NOTE — Progress Notes (Signed)
Physical Therapy Treatment Patient Details Name: Jaime Murillo MRN: [id number] DOB: 1960/04/10 Today's Date: 03/24/2013 Time: 6270-3500 PT Time Calculation (min): 15 min  PT Assessment / Plan / Recommendation  History of Present Illness s/p Rt total mastectomy; pt developed vertigo with vomiting when up standing walking.   PT Comments   See prior note re: BPPV treatment. Pt's mobility assessed and much better than 2/27, however not yet back to baseline (pt had only 15 minutes to rest between cannalith repositioning and mobility--it often takes up to 2 hours for the vestibular system to "settle down" after treatment). Final decisions re: equipment and f/u PT needs to be made after PT re-assessment 3/1 a.m.   Follow Up Recommendations  No PT follow up     Does the patient have the potential to tolerate intense rehabilitation     Barriers to Discharge        Equipment Recommendations  Rolling walker with 5" wheels    Recommendations for Other Services    Frequency Min 3X/week   Progress towards PT Goals Progress towards PT goals: Progressing toward goals  Plan Discharge plan needs to be updated    Precautions / Restrictions Precautions Precautions: Fall   Pertinent Vitals/Pain Denied vertigo +nausea 4-8/10    Mobility  Bed Mobility Overal bed mobility: Modified Independent General bed mobility comments: required assist to complete positioning for Epley maneuver Transfers Overall transfer level: Needs assistance Equipment used: None Transfers: Sit to/from Stand Sit to Stand: Min guard General transfer comment: for safety Ambulation/Gait Ambulation/Gait assistance: Min guard Ambulation Distance (Feet): 200 Feet Assistive device: Rolling walker (2 wheeled) Gait Pattern/deviations: WFL(Within Functional Limits) General Gait Details: cues for visual targets, however pt visually scanning environment throughout walk. Reported incr nausea, however feels related to recent enema     Exercises     PT Diagnosis:    PT Problem List:   PT Treatment Interventions:     PT Goals (current goals can now be found in the care plan section) Acute Rehab PT Goals Patient Stated Goal: To walk without dizziness  Visit Information  Last PT Received On: 03/24/13 Assistance Needed: +1 History of Present Illness: s/p Rt total mastectomy; pt developed vertigo with vomiting when up standing walking.    Subjective Data  Subjective: Reports room spins whenever she stands and tries to walk Patient Stated Goal: To walk without dizziness   Cognition  Cognition Arousal/Alertness: Awake/alert Behavior During Therapy: WFL for tasks assessed/performed Overall Cognitive Status: Within Functional Limits for tasks assessed    Balance  General Comments General comments (skin integrity, edema, etc.): Instructed to sit up for at least one hour to assist with otoconia staying in place.  End of Session PT - End of Session Activity Tolerance: Patient tolerated treatment well Patient left: in chair;with call bell/phone within reach;with family/visitor present Nurse Communication: Mobility status;Other (comment) (anticipate seeing pt in a.m. for further assessment)   GP     Carloyn Lahue 03/24/2013, 5:38 PM Pager [phone]

## 2013-03-24 NOTE — Progress Notes (Signed)
POD# 4 R areolar sparing mastectomy, TE, acellular dermis  CT head negative C/o HA today, no longer seeing double, still nausea Refused scop patch   Temp:  [97.6 F (36.4 C)-98.3 F (36.8 C)] 98.1 F (36.7 C) (02/28 0621) Pulse Rate:  [69-91] 72 (02/28 0621) Resp:  [16-18] 16 (02/28 0621) BP: (100-114)/(62-77) 100/64 mmHg (02/28 0621) SpO2:  [97 %-100 %] 100 % (02/28 0621)  JP 90+  Chest flat Tegaderm in place JP serosang Skin flaps viable  A/P: Willing to try scop patch today. Concerned we are just throwing medicine at her complaints. PT to see again today Pt's sister will be staying with her post d/c  Irene Limbo, MD [facility] [phone]

## 2013-03-24 NOTE — Progress Notes (Signed)
Physical Therapy Treatment Patient Details Name: RANESHA VAL MRN: [id number] DOB: [birth year] Today's Date: 03/24/2013 Time: 5809-9833 PT Time Calculation (min): 35 min  PT Assessment / Plan / Recommendation  History of Present Illness s/p Rt total mastectomy; pt developed vertigo with vomiting when up standing walking.   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 Pt may continue to feel nauseous and vertiginous for up to two hours post treatment. Ideally pt will be re-assessed by PT 3/1 morning prior to d/c to see if further cannalith repositioning required.    Follow Up Recommendations   (TBA-hopeful no HH or OPPT for vestibular to save her limited visits for post-mastectomy rehab needs)     Does the patient have the potential to tolerate intense rehabilitation     Barriers to Discharge        Equipment Recommendations   (TBA)    Recommendations for Other Services    Frequency Min 3X/week   Progress towards PT Goals Progress towards PT goals: Progressing toward goals  Plan Discharge plan needs to be updated (after mobility attempted later today)    Precautions / Restrictions Precautions Precautions: Fall   Pertinent Vitals/Pain No nausea with treatment +pain Rt chest with rolling to her Rt for treatment    Mobility  Bed Mobility Overal bed mobility: Modified Independent General bed mobility comments: required assist to complete positioning for Epley maneuver    Exercises     PT Diagnosis:    PT Problem List:   PT Treatment Interventions:     PT Goals (current goals can now be found in the care plan section) Acute Rehab PT Goals Patient Stated Goal: To walk without dizziness  Visit Information  Last PT Received On: 03/24/13 Reason Eval/Treat Not Completed: Patient not medically ready History of Present Illness: s/p Rt total mastectomy; pt developed vertigo with vomiting when up standing walking.     Subjective Data  Subjective: Reports room spins whenever she stands and tries to walk Patient Stated Goal: To walk without dizziness   Cognition  Cognition Arousal/Alertness: Awake/alert Behavior During Therapy: WFL for tasks assessed/performed Overall Cognitive Status: Within Functional Limits for tasks assessed    Balance  General Comments General comments (skin integrity, edema, etc.): Pt tested for supine head roll Rt and Lt (horizontal canal) and asymptomatic. Performed modified Lt Hallpike-Dix (using hospital bed in reverse Trendelenberg to Trendelenberg) with + vertigo. Pt reporting room spinning to her Lt with duration 20 seconds. No nystagmus appreciated, however pt had both scopolamine and meclizine prior to testing. Performed Epley maneuver for Lt posterior canalithiasis (with final position modified due to painful weightbearing on Rt chest due to recent surgery. Pt moved to prone and then came off EOB to standing). Tested Rt posterior canal via modified Hallpike-Dix to Rt (again using hospital bed in Trendelenberg) and pt asymptomatic.   End of Session PT - End of Session Activity Tolerance: Patient tolerated treatment well Patient left: in bed;with call bell/phone within reach;with family/visitor present Nurse Communication: Other (comment) (+ Lt BPPV)   GP     Phoenyx Paulsen 03/24/2013, 4:55 PM Pager [phone]

## 2013-03-24 NOTE — Progress Notes (Signed)
Patient ID: Jaime Murillo, female   DOB: 08-11-1960, 53 y.o.   MRN: [id number] 4 Days Post-Op  Subjective: PT wondered about mass lesion in head, so CT head ordered.  This was negative.    Objective: Vital signs in last 24 hours: Temp:  [97.6 F (36.4 C)-98.3 F (36.8 C)] 98.1 F (36.7 C) (02/28 0621) Pulse Rate:  [69-91] 72 (02/28 0621) Resp:  [16-18] 16 (02/28 0621) BP: (100-114)/(62-77) 100/64 mmHg (02/28 0621) SpO2:  [97 %-100 %] 100 % (02/28 0621) Last BM Date: 03/19/13  Intake/Output from previous day: 02/27 0701 - 02/28 0700 In: 420 [P.O.:360] Out: 90 [Drains:90] Intake/Output this shift:    General appearance: alert, cooperative and no distress Chest wall: right sided chest wall tenderness improved, drain serosang GI: soft, non-tender Extremities: extremities normal, atraumatic, no cyanosis or edema  Lab Results:   Recent Labs  03/22/13 0615  WBC 3.6*  HGB 9.6*  HCT 27.3*  PLT 175   BMET  Recent Labs  03/22/13 0615  NA 140  K 4.2  CL 104  CO2 27  GLUCOSE 100*  BUN 6  CREATININE 0.65  CALCIUM 8.9   PT/INR No results found for this basename: LABPROT, INR,  in the last 72 hours ABG No results found for this basename: PHART, PCO2, PO2, HCO3,  in the last 72 hours  Studies/Results: Ct Head Wo Contrast  03/23/2013   CLINICAL DATA:  Postop right mastectomy. New onset right-sided weakness and difficulty moving right arm and leg. Dizziness with unsteady gait.  EXAM: CT HEAD WITHOUT CONTRAST  TECHNIQUE: Contiguous axial images were obtained from the base of the skull through the vertex without contrast.  COMPARISON:  None  FINDINGS: No evidence for acute infarction, hemorrhage, mass lesion, hydrocephalus, or extra-axial fluid. There is no atrophy or white matter disease. No CT signs of proximal vascular thrombosis. No proximal vascular calcification.  Calvarium intact.  Negative orbits, sinuses, and mastoids.  IMPRESSION: Negative exam.   Electronically Signed    By: Rolla Flatten M.D.   On: 03/23/2013 14:33    Anti-infectives: Anti-infectives   Start     Dose/Rate Route Frequency Ordered Stop   03/24/13 0000  sulfamethoxazole-trimethoprim (BACTRIM DS) 800-160 MG per tablet  Status:  Discontinued     1 tablet Oral Every 12 hours 03/24/13 0957 03/24/13    03/23/13 1100  sulfamethoxazole-trimethoprim (BACTRIM DS) 800-160 MG per tablet 1 tablet     1 tablet Oral Every 12 hours 03/23/13 0959     03/22/13 0000  sulfamethoxazole-trimethoprim (BACTRIM DS) 800-160 MG per tablet     1 tablet Oral 2 times daily 03/22/13 0931     03/20/13 1400  valACYclovir (VALTREX) tablet 500 mg  Status:  Discontinued     500 mg Oral Daily 03/20/13 1248 03/23/13 1120   03/20/13 1400  ceFAZolin (ANCEF) IVPB 1 g/50 mL premix  Status:  Discontinued     1 g 100 mL/hr over 30 Minutes Intravenous 3 times per day 03/20/13 1248 03/23/13 0959   03/20/13 0944  bacitracin 50,000 Units, gentamicin (GARAMYCIN) 80 mg, ceFAZolin (ANCEF) 1 g in sodium chloride 0.9 % 1,000 mL  Status:  Discontinued       As needed 03/20/13 0944 03/20/13 1039   03/20/13 0730  bacitracin 50,000 Units, gentamicin (GARAMYCIN) 80 mg, ceFAZolin (ANCEF) 1 g in sodium chloride 0.9 % 1,000 mL  Status:  Discontinued      Irrigation Once 03/20/13 0726 03/20/13 1248   03/20/13 0600  ceFAZolin (  ANCEF) IVPB 2 g/50 mL premix     2 g 100 mL/hr over 30 Minutes Intravenous On call to O.R. 03/19/13 1436 03/20/13 0736      Assessment/Plan: s/p Procedure(s): RIGHT SKIN SPARING TOTAL MASTECTOMY (Right) IMMEDIATE RIGHT BREAST RECONSTRUCTION WITH PLACEMENT OF TISSUE EXPANDER AND FLEX HD (ACELLULAR HYDRATED DERMIS) (Right)  PAS Diet as tolerated.   D/c today if PT clears No BM yet, so try enema Muscle relaxant helped with tightness Encouraged minimizing narcotics.     LOS: 4 days    [facility] 03/24/2013

## 2013-03-24 NOTE — Progress Notes (Signed)
PT Cancellation Note  Patient Details Name: KYRAH SCHIRO MRN: [id number] DOB: 1960/06/18   Cancelled Treatment:    Reason Eval/Treat Not Completed: Patient not medically ready--RN reports pt just vomited again (despite Zofran and scopolamine patch). Pt reports she only at 3 bites of lunch and it came right back up. RN is going to check with MD re: ?use of meclizine. Will follow-up later today.   Porsha Skilton 03/24/2013, 1:31 PM Pager [phone]

## 2013-03-24 NOTE — Progress Notes (Signed)
SMOG enema given as per order with good results.  Patient continues to complain of dizziness with movement which leads to nausea.  Patient states when she is lying in bed she feels neither dizziness or nausea.  Patient also states looking at the tile in the bathroom makes her feel very dizzy.  MD notified, orders received for Meclizine.

## 2013-03-24 NOTE — Progress Notes (Signed)
PT Cancellation Note  Patient Details Name: JANEYA DEYO MRN: [id number] DOB: 1960-04-12   Cancelled Treatment:    Reason Eval/Treat Not Completed: Patient not medically ready--pt just up to bathroom with vertigo and nausea/vomiting. RN just gave IV Zofran. Will give her system a chance to settle down and return this afternoon.   Tyjai Charbonnet 03/24/2013, 12:23 PM Pager [phone]

## 2013-03-25 MED ORDER — MECLIZINE HCL 25 MG PO TABS: 12.5000 mg | ORAL_TABLET | Freq: Three times a day (TID) | ORAL | Status: DC | PRN

## 2013-03-25 MED ORDER — SULFAMETHOXAZOLE-TMP DS 800-160 MG PO TABS: 1.0000 | ORAL_TABLET | Freq: Two times a day (BID) | ORAL | Status: DC

## 2013-03-25 MED ORDER — OXYCODONE-ACETAMINOPHEN 5-325 MG PO TABS: 1.0000 | ORAL_TABLET | ORAL | Status: DC | PRN

## 2013-03-25 NOTE — Progress Notes (Signed)
Physical Therapy Treatment Patient Details Name: Jaime Murillo MRN: [id number] DOB: 12-15-1960 Today's Date: 03/25/2013 Time: 4665-9935 PT Time Calculation (min): 19 min  PT Assessment / Plan / Recommendation  History of Present Illness s/p Rt total mastectomy; pt developed vertigo with vomiting when up standing walking.   PT Comments   Pt reports no further spinning/vertigo when up walking or going to the bathroom. Assisted pt to ambulate in the hall and no loss of balance observed. Pt given printed information about BPPV and educated on possible recurrence and how to self-treat or seek assistance of an OPPT if needed. Pt okay for discharge home from a PT perspective.    Follow Up Recommendations  No PT follow up     Does the patient have the potential to tolerate intense rehabilitation     Barriers to Discharge        Equipment Recommendations  None recommended by PT    Recommendations for Other Services    Frequency Min 3X/week   Progress towards PT Goals Progress towards PT goals: Progressing toward goals  Plan Current plan remains appropriate;Equipment recommendations need to be updated    Precautions / Restrictions Precautions Precautions: Fall   Pertinent Vitals/Pain Rt chest pain (surgical-not rated) Denied nausea throughout session     Mobility  Bed Mobility Overal bed mobility: Modified Independent Transfers Overall transfer level: Needs assistance Equipment used: None Transfers: Sit to/from Stand Sit to Stand: Supervision General transfer comment: for safety due to lightheadedness; no vertigo and no loss of balance Ambulation/Gait Ambulation/Gait assistance: Min guard Ambulation Distance (Feet): 200 Feet Assistive device: None Gait Pattern/deviations: WFL(Within Functional Limits) Gait velocity interpretation: Below normal speed for age/gender General Gait Details: able to perform head turns, turn up to 180 degrees with no vertigo or loss of balance     Exercises     PT Diagnosis:    PT Problem List:   PT Treatment Interventions:     PT Goals (current goals can now be found in the care plan section) Acute Rehab PT Goals Patient Stated Goal: To walk without dizziness  Visit Information  Last PT Received On: 03/25/13 Assistance Needed: +1 History of Present Illness: s/p Rt total mastectomy; pt developed vertigo with vomiting when up standing walking.    Subjective Data  Subjective: States no spinning when she's been up to bathroom Patient Stated Goal: To walk without dizziness   Cognition  Cognition Arousal/Alertness: Awake/alert Behavior During Therapy: WFL for tasks assessed/performed Overall Cognitive Status: Within Functional Limits for tasks assessed    Balance  Balance Overall balance assessment: Independent General Comments General comments (skin integrity, edema, etc.): Provided handouts re: BPPV diagnosis, Epley maneuver (left), Brandt-daroff exercises, and OPPT facility which provides vestibular rehab. Educated pt on possible recurrence (especially if she continues to vomit) and steps she can take to treat herself. If unsuccessful, instructed to contact her doctor for referral to [facility] (if he cannot perform assessment/treatment).  End of Session PT - End of Session Equipment Utilized During Treatment: Gait belt Activity Tolerance: Patient tolerated treatment well Patient left: in chair;with call bell/phone within reach Nurse Communication: Mobility status;Other (comment) (OK for d/c from PT perspective)   GP     Nayel Purdy 03/25/2013, 9:12 AM Pager [phone]

## 2013-03-25 NOTE — Discharge Summary (Signed)
Physician Discharge Summary  Patient ID: Jaime Murillo MRN: [id number] DOB/AGE: January 27, 1960 53 y.o.  Admit date: 03/20/2013 Discharge date: 03/25/2013  Admission Diagnoses: Patient Active Problem List   Diagnosis Date Noted  . Breast cancer, right 03/20/2013  . Peripheral neuropathy 02/08/2013  . High blood pressure   . Contact lens/glasses fitting   . Breast cancer   . Cancer of lower-inner quadrant of RIGHT female breast 06/23/2012  . HSV (herpes simplex virus) anogenital infection 06/06/2012  . Breast mass, right 06/06/2012  . HTN (hypertension) 05/05/2011    Discharge Diagnoses:  Active Problems:   Breast cancer, right vertigo  Discharged Condition: stable  Hospital Course:  Pt admitted to floor following right mastectomy with expander reconstruction.  She had dizziness and emesis for multiple days after surgery.  She had pain control issues as well.  She was switched from percocet to oxycodone and muscle relaxant started.  PT saw her and felt that she had vertigo.  She was started on meclizine.  She required a walker for a while.  Vertigo resolved prior to d/c.  Flap was in good condition throughout.    Consults: physical therapy.  Significant Diagnostic Studies: labs: see epic.  Mild acute blood loss anemia.    Treatments: analgesia: Morphine and oxycodone, surgery: see above and meclizine, antiemetics.    Discharge Exam: Blood pressure 108/96, pulse 90, temperature 98.6 F (37 C), temperature source Oral, resp. rate 20, height 5\' 5"  (1.651 m), weight 144 lb 8 oz (65.545 kg), SpO2 97.00%. General appearance: alert, cooperative and no distress Resp: breathing comfortably Chest wall: right sided chest wall tenderness Extremities: extremities normal, atraumatic, no cyanosis or edema Drain serosang  Disposition: 01-Home or Self Care      Discharge Orders   Future Appointments Provider Department Dept Phone   03/28/2013 11:15 AM [facility] [phone]   03/28/2013 11:45 AM Deatra Robinson, MD [facility] [phone]   04/09/2013 12:15 PM Stark Klein, MD [facility] [place] [phone]   05/25/2013 8:30 AM Dennie Bible, NP [facility] [phone]   Future Orders Complete By Expires   Call MD for:  difficulty breathing, headache or visual disturbances  As directed    Call MD for:  persistant nausea and vomiting  As directed    Call MD for:  persistant nausea and vomiting  As directed    Call MD for:  redness, tenderness, or signs of infection (pain, swelling, bleeding, redness, odor or green/yellow discharge around incision site)  As directed    Call MD for:  redness, tenderness, or signs of infection (pain, swelling, redness, odor or green/yellow discharge around incision site)  As directed    Call MD for:  redness, tenderness, or signs of infection (pain, swelling, redness, odor or green/yellow discharge around incision site)  As directed    Call MD for:  severe or increased pain, loss or decreased feeling  in affected limb(s)  As directed    Call MD for:  severe uncontrolled pain  As directed    Call MD for:  severe uncontrolled pain  As directed    Call MD for:  temperature >100.4  As directed    Call MD for:  temperature >100.4  As directed    Diet - low sodium heart healthy  As directed    Diet - low sodium heart healthy  As directed    Discharge instructions  As directed  Comments:     Ok to shower. Pat Tegaderms dry. JP to bulb suction, strip and record q 12 hrs and bring log to clinic.  No strenuous exercise or activity.   Driving Restrictions  As directed    Comments:     No driving while taking narcotics.   Increase activity slowly  As directed    Increase activity slowly  As directed    Lifting restrictions  As directed    Comments:     No lifting greater than 5-10 lbs   Resume previous diet  As directed    Walker  rolling  As directed        Medication List    STOP taking these medications       multivitamin tablet      TAKE these medications       acetaminophen 500 MG tablet  Commonly known as:  TYLENOL  Take 1,000 mg by mouth every 6 (six) hours as needed for mild pain.     baclofen 10 MG tablet  Commonly known as:  LIORESAL  Take 1 tablet (10 mg total) by mouth 3 (three) times daily as needed for muscle spasms.     bisacodyl 10 MG suppository  Commonly known as:  DULCOLAX  Place 1 suppository (10 mg total) rectally daily as needed (no bowel movement).     DULoxetine 30 MG capsule  Commonly known as:  CYMBALTA  One po qday xone week, then 2 tabs po qam     ferrous sulfate 325 (65 FE) MG tablet  Take 325 mg by mouth daily with breakfast.     gabapentin 300 MG capsule  Commonly known as:  NEURONTIN  Take 300 mg by mouth 3 (three) times daily.     hydrochlorothiazide 25 MG tablet  Commonly known as:  HYDRODIURIL  Take 1 tablet (25 mg total) by mouth daily.     ibuprofen 200 MG tablet  Commonly known as:  ADVIL,MOTRIN  Take 400 mg by mouth every 6 (six) hours as needed for moderate pain.     meclizine 25 MG tablet  Commonly known as:  ANTIVERT  Take 0.5-1 tablets (12.5-25 mg total) by mouth 3 (three) times daily as needed for dizziness (vertigo).     ondansetron 8 MG tablet  Commonly known as:  ZOFRAN  Take 1 tablet (8 mg total) by mouth every 8 (eight) hours as needed for nausea (Take 1 pill by mouth twice a day as needed  for nausea and vomitting starting 3rd day after chemo).     oxyCODONE 5 MG immediate release tablet  Commonly known as:  Oxy IR/ROXICODONE  Take 1-4 tablets (5-20 mg total) by mouth every 4 (four) hours as needed for moderate pain, severe pain or breakthrough pain.     oxyCODONE-acetaminophen 5-325 MG per tablet  Commonly known as:  PERCOCET/ROXICET  Take 1-2 tablets by mouth every 4 (four) hours as needed for moderate pain.     polyethylene glycol  packet  Commonly known as:  MIRALAX / GLYCOLAX  Take 17 g by mouth 2 (two) times daily.     pregabalin 50 MG capsule  Commonly known as:  LYRICA  Take 2 capsules (100 mg total) by mouth 3 (three) times daily.     prochlorperazine 25 MG suppository  Commonly known as:  COMPAZINE  Place 25 mg rectally every 12 (twelve) hours as needed for nausea (as needed for nausea and vomitting).     senna-docusate 8.6-50 MG per  tablet  Commonly known as:  Senokot-S  Take 1 tablet by mouth daily.     sulfamethoxazole-trimethoprim 800-160 MG per tablet  Commonly known as:  BACTRIM DS  Take 1 tablet by mouth 2 (two) times daily.     SUPER B COMPLEX PO  Take 1 tablet by mouth daily.     valACYclovir 500 MG tablet  Commonly known as:  VALTREX  Take 500 mg by mouth daily.     VITAMIN E COMPLEX PO  Take 1 tablet by mouth daily.     zolpidem 5 MG tablet  Commonly known as:  AMBIEN  Take 1 tablet (5 mg total) by mouth at bedtime as needed for sleep.       Follow-up Information   Follow up with [facility], Arnoldo Hooker, MD In 1 week. (as scheduled)    Specialty:  Plastic Surgery   Contact information:   [address] [place] [address] [phone]       Signed: Stark Klein 03/25/2013, 10:10 AM

## 2013-03-25 NOTE — Progress Notes (Signed)
Discharge instructions reviewed with patient and her sister, questions answered, verbalized understanding.  Written prescriptions for pain medication as well as antibiotics and Meclizine given, others sent electronically to [facility].  Patient transported to front of hospital to be taken home by sister.  Patient in good condition upon leaving [facility].

## 2013-03-28 ENCOUNTER — Ambulatory Visit (HOSPITAL_BASED_OUTPATIENT_CLINIC_OR_DEPARTMENT_OTHER): Payer: Medicaid Other | Admitting: Oncology

## 2013-03-28 ENCOUNTER — Encounter: Payer: Self-pay | Admitting: Oncology

## 2013-03-28 ENCOUNTER — Telehealth: Payer: Self-pay | Admitting: Oncology

## 2013-03-28 ENCOUNTER — Other Ambulatory Visit (HOSPITAL_BASED_OUTPATIENT_CLINIC_OR_DEPARTMENT_OTHER): Payer: Medicaid Other

## 2013-03-28 VITALS — BP 109/71 | HR 93 | Temp 98.4°F | Resp 18 | Ht 65.0 in | Wt 120.8 lb

## 2013-03-28 DIAGNOSIS — G589 Mononeuropathy, unspecified: Secondary | ICD-10-CM

## 2013-03-28 DIAGNOSIS — C50219 Malignant neoplasm of upper-inner quadrant of unspecified female breast: Secondary | ICD-10-CM

## 2013-03-28 DIAGNOSIS — C50311 Malignant neoplasm of lower-inner quadrant of right female breast: Secondary | ICD-10-CM

## 2013-03-28 DIAGNOSIS — Z171 Estrogen receptor negative status [ER-]: Secondary | ICD-10-CM

## 2013-03-28 DIAGNOSIS — C50319 Malignant neoplasm of lower-inner quadrant of unspecified female breast: Secondary | ICD-10-CM

## 2013-03-28 DIAGNOSIS — B009 Herpesviral infection, unspecified: Secondary | ICD-10-CM

## 2013-03-28 LAB — CBC WITH DIFFERENTIAL/PLATELET
BASO%: 0.8 % (ref 0.0–2.0)
Basophils Absolute: 0 10*3/uL (ref 0.0–0.1)
EOS ABS: 0 10*3/uL (ref 0.0–0.5)
EOS%: 0.7 % (ref 0.0–7.0)
HCT: 30.3 % — ABNORMAL LOW (ref 34.8–46.6)
HGB: 10.2 g/dL — ABNORMAL LOW (ref 11.6–15.9)
LYMPH%: 30.1 % (ref 14.0–49.7)
MCH: 31.6 pg (ref 25.1–34.0)
MCHC: 33.8 g/dL (ref 31.5–36.0)
MCV: 93.4 fL (ref 79.5–101.0)
MONO#: 0.5 10*3/uL (ref 0.1–0.9)
MONO%: 15.4 % — ABNORMAL HIGH (ref 0.0–14.0)
NEUT%: 53 % (ref 38.4–76.8)
NEUTROS ABS: 1.7 10*3/uL (ref 1.5–6.5)
PLATELETS: 242 10*3/uL (ref 145–400)
RBC: 3.24 10*6/uL — ABNORMAL LOW (ref 3.70–5.45)
RDW: 12.3 % (ref 11.2–14.5)
WBC: 3.1 10*3/uL — ABNORMAL LOW (ref 3.9–10.3)
lymph#: 0.9 10*3/uL (ref 0.9–3.3)

## 2013-03-28 LAB — COMPREHENSIVE METABOLIC PANEL (CC13)
ALK PHOS: 52 U/L (ref 40–150)
ALT: 14 U/L (ref 0–55)
AST: 25 U/L (ref 5–34)
Albumin: 3.7 g/dL (ref 3.5–5.0)
Anion Gap: 7 mEq/L (ref 3–11)
BUN: 14.7 mg/dL (ref 7.0–26.0)
CO2: 31 meq/L — AB (ref 22–29)
Calcium: 9.6 mg/dL (ref 8.4–10.4)
Chloride: 100 mEq/L (ref 98–109)
Creatinine: 0.9 mg/dL (ref 0.6–1.1)
Glucose: 71 mg/dl (ref 70–140)
Potassium: 3.4 mEq/L — ABNORMAL LOW (ref 3.5–5.1)
SODIUM: 138 meq/L (ref 136–145)
TOTAL PROTEIN: 7.3 g/dL (ref 6.4–8.3)
Total Bilirubin: 0.4 mg/dL (ref 0.20–1.20)

## 2013-03-28 NOTE — Telephone Encounter (Signed)
, °

## 2013-03-28 NOTE — Progress Notes (Signed)
OFFICE PROGRESS NOTE  CC  Elyn Peers, MD [address] [place] [address] Dr. Theda Sers Dr. 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 Patient had a palpable mass at the 3:00 position. She was seen at the breast Center on 06/20/2012 she had a mammogram and ultrasound showing a 1.7 cm mass at the 3:00 position. 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 Tumor was grade 3.  #2 MRIs performed on 06/26/2012 showed no biopsy mass at 3:00 but also 2 other adjacent masses at 1 at 2:00 measuring 0.8 x 1.6 x 2.2 cm andalso 6 mm ill-defined mass posterior to the biopsy-proven malignancy.  #3 S/P neoadjuvant chemotherapy initially consisting of Adriamycin Cytoxan to be given dose dense for 4 cycles.  She underwent this from 07/17/12 through 09/04/12 and it was followed by weekly Taxol and carboplatinum for a total of 12 weeks starting on 09/11/12.  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  S/P Gemzar Carbo starting 11/06/12 - 12/04/12 x 3 cycles.    #4 Patient underwent sentinel node biopsy with Dr. Barry Dienes on 01/12/13 to evaluate whether or not she was a candidate for immediate reconstruction following bilateral mastectomies.  Sentinel nodes were negative for metastases, therefore she will not need radiation therapy.  She will undergo bilateral nipple sparing mastectomies with immediate reconstruction some time in February with Dr. Barry Dienes and Dr. Leland Johns.   #5 S/P right mastectomy on 03/20/13 with final pathology showing:  FINAL DIAGNOSIS Diagnosis Breast, simple mastectomy, Right - BREAST PARENCHYMA WITH NEOADJUVANT CHANGES AND  PSEUDOANGIOMATOUS STROMAL HYPERPLASIA. - FAT NECROSIS PRESENT. - BENIGN SKIN AND NIPPLE. - NO ATYPIA OR MALIGNANCY IDENTIFIED. - SEE COMMENT. Microscopic Comment No residual tumor is identified status post neoadjuvant therapy. This represents a complete response (CR) and corresponds to a pathology stage of ypT0. An oncology table will not be issued as no residual tumor is identified. (RH:kh 03-21-13)   CURRENT THERAPY: observation,   INTERVAL HISTORY: Jaime Murillo 53 y.o. female returns for follow up post mastectomy. She tolerated the procedure very well. She is recovering from it very nicely. She did have immediate reconstruction. Today she denies any headaches double vision blurring of vision fevers chills night sweats. No shortness of breath chest pains palpitations. No abdominal pain no diarrhea or constipation. She has no easy bruising or bleeding. She has no myalgias and arthralgias. No peripheral paresthesias or gait disturbances. Remainder of the 10 point review of systems is negative.  MEDICAL HISTORY: Past Medical History  Diagnosis Date  . HSV infection     takes Valtrex daily  . Urinary frequency   . Breast cancer 2014    triple negative  . Nausea     takes Compazine and Zofran as needed  . Anemia     takes Ferrous Sulfate daily  . Depression     takes Cymbalta daily  . Insomnia     takes Ambien nightly as needed  . Hypertension     takes HCTZ daily  . Dizziness     r/t meds  . Neuropathy     takes Lyrica daily and alternates with Gabapentin  . Joint pain   . Nocturia   .  History of blood transfusion     no abnormal reaction noted  . Anxiety   . Vision blurred 02/2013  . 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  MEDICATIONS:  Current Outpatient Prescriptions  Medication Sig Dispense Refill  . acetaminophen (TYLENOL) 500 MG tablet Take 1,000 mg by mouth every 6 (six) hours as needed for mild pain.       . B Complex-C (SUPER B COMPLEX PO) Take 1 tablet by mouth daily.       . baclofen (LIORESAL) 10 MG tablet Take 1 tablet (10 mg total) by mouth 3 (three) times daily as needed for muscle spasms.  30 each  0  . bisacodyl (DULCOLAX) 10 MG suppository Place 1 suppository (10 mg total) rectally daily as needed (no bowel movement).  12 suppository  0  . DULoxetine (CYMBALTA) 30 MG capsule One po qday xone week, then 2 tabs po qam  60 capsule  12  . ferrous sulfate 325 (65 FE) MG tablet Take 325 mg by mouth daily with breakfast.      . gabapentin (NEURONTIN) 300 MG capsule Take 300 mg by mouth 3 (three) times daily.      . hydrochlorothiazide (HYDRODIURIL) 25 MG tablet Take 1 tablet (25 mg total) by mouth daily.  30 tablet  11  . ibuprofen (ADVIL,MOTRIN) 200 MG tablet Take 400 mg by mouth every 6 (six) hours as needed for moderate pain.      . meclizine (ANTIVERT) 25 MG tablet Take 0.5-1 tablets (12.5-25 mg total) by mouth 3 (three) times daily as needed for dizziness (vertigo).  30 tablet  0  . ondansetron (ZOFRAN) 8 MG tablet Take 1 tablet (8 mg total) by mouth every 8 (eight) hours as needed for nausea (Take 1 pill by mouth twice a day as needed  for nausea and vomitting starting 3rd day after chemo).  20 tablet  3  . oxyCODONE (OXY IR/ROXICODONE) 5 MG immediate release tablet Take 1-4 tablets (5-20 mg total) by mouth every 4 (four) hours as needed for moderate pain, severe pain or breakthrough pain.  60 tablet  0  . oxyCODONE-acetaminophen (PERCOCET/ROXICET) 5-325 MG per tablet Take 1-2 tablets by mouth every 4 (four) hours as needed for moderate pain.  30 tablet  0  . polyethylene glycol (MIRALAX / GLYCOLAX) packet Take 17 g by mouth 2 (two) times daily.  14 each  0  . pregabalin (LYRICA) 50 MG capsule Take 2 capsules (100 mg total) by mouth 3 (three) times daily.  180 capsule  5  . prochlorperazine (COMPAZINE) 25 MG suppository Place 25 mg rectally every 12 (twelve) hours as needed for nausea (as  needed for nausea and vomitting).      Marland Kitchen senna-docusate (SENOKOT-S) 8.6-50 MG per tablet Take 1 tablet by mouth daily.  30 tablet  0  . sulfamethoxazole-trimethoprim (BACTRIM DS) 800-160 MG per tablet Take 1 tablet by mouth 2 (two) times daily.  10 tablet  0  . valACYclovir (VALTREX) 500 MG tablet Take 500 mg by mouth daily.      Marland Kitchen VITAMIN E COMPLEX PO Take 1 tablet by mouth daily.       Marland Kitchen zolpidem (AMBIEN) 5 MG tablet Take 1 tablet (5 mg total) by mouth at bedtime as needed for sleep.  30 tablet  5  . [DISCONTINUED] prochlorperazine (COMPAZINE) 10 MG tablet Take 1 tablet (10 mg total) by mouth every  6 (six) hours as needed (Nausea or vomiting).  30 tablet  1   No current facility-administered medications for this visit.    SURGICAL HISTORY:  Past Surgical History  Procedure Laterality Date  . Tubal ligation  2003    LTL  . Portacath placement N/A 07/06/2012    Procedure: INSERTION PORT-A-CATH;  Surgeon: Stark Klein, MD;  Location: [facility];  Service: General;  Laterality: N/A;  . Port-a-cath removal Left 01/12/2013    Procedure: REMOVAL PORT-A-CATH;  Surgeon: Stark Klein, MD;  Location: [facility];  Service: General;  Laterality: Left;  . Breast lumpectomy Right 01/12/2013    Procedure: RIGHT SENTINEL LYMPH  NODE BIOPSY;  Surgeon: Stark Klein, MD;  Location: [facility];  Service: General;  Laterality: Right;  RIGHT SENTINEL LYMPH NODE BIOPSY  . Colonoscopy    . Mastectomy  03/20/2013    WITH RECONSRTUCTION          DR BYERLY   . Total mastectomy Right 03/20/2013    Procedure: RIGHT SKIN SPARING TOTAL MASTECTOMY;  Surgeon: Stark Klein, MD;  Location: [facility];  Service: General;  Laterality: Right;  . Breast reconstruction with placement of tissue expander and flex hd (acellular hydrated dermis) Right 03/20/2013    Procedure: IMMEDIATE RIGHT BREAST RECONSTRUCTION WITH PLACEMENT OF TISSUE EXPANDER AND FLEX HD (ACELLULAR HYDRATED DERMIS);  Surgeon: Irene Limbo,  MD;  Location: [facility];  Service: Plastics;  Laterality: Right;    REVIEW OF SYSTEMS: A 10 point review of systems was conducted and is otherwise negative except for what is noted above.     PHYSICAL EXAMINATION: Blood pressure 109/71, pulse 93, temperature 98.4 F (36.9 C), temperature source Oral, resp. rate 18, height $RemoveBe'5\' 5"'hNGCXnePG$  (1.651 m), weight 120 lb 12.8 oz (54.795 kg), last menstrual period 07/10/2012. Body mass index is 20.1 kg/(m^2). GENERAL: Patient is a well appearing female in no acute distress HEENT:  Sclerae anicteric.  Oropharynx clear and moist. No ulcerations or evidence of oropharyngeal candidiasis. Neck is supple.  NODES:  No cervical, supraclavicular, or axillary lymphadenopathy palpated.  BREAST EXAM:   Left breast no masses or nipple discharge Right breast without palpable abnormality, s/p right sentinel node biopsy mildly tender LUNGS:  Clear to auscultation bilaterally.  No wheezes or rhonchi. HEART:  Regular rate and rhythm. No murmur appreciated. ABDOMEN:  Soft, nontender.  Positive, normoactive bowel sounds. No organomegaly palpated. MSK:  No focal spinal tenderness to palpation. Full range of motion bilaterally in the upper extremities. EXTREMITIES:  No peripheral edema.   SKIN:  Clear with no obvious rashes or skin changes. No nail dyscrasia. NEURO:  Nonfocal. Well oriented.  Appropriate affect. ECOG PERFORMANCE STATUS: 0 - Asymptomatic  LABORATORY DATA: Lab Results  Component Value Date   WBC 3.1* 03/28/2013   HGB 10.2* 03/28/2013   HCT 30.3* 03/28/2013   MCV 93.4 03/28/2013   PLT 242 03/28/2013      Chemistry      Component Value Date/Time   NA 138 03/28/2013 1107   NA 140 03/22/2013 0615   K 3.4* 03/28/2013 1107   K 4.2 03/22/2013 0615   CL 104 03/22/2013 0615   CL 104 07/17/2012 1357   CO2 31* 03/28/2013 1107   CO2 27 03/22/2013 0615   BUN 14.7 03/28/2013 1107   BUN 6 03/22/2013 0615   CREATININE 0.9 03/28/2013 1107   CREATININE 0.65 03/22/2013 0615   CREATININE 0.70  06/06/2012 0853      Component Value Date/Time  CALCIUM 9.6 03/28/2013 1107   CALCIUM 8.9 03/22/2013 0615   ALKPHOS 52 03/28/2013 1107   ALKPHOS 35* 06/06/2012 0853   AST 25 03/28/2013 1107   AST 14 06/06/2012 0853   ALT 14 03/28/2013 1107   ALT 9 06/06/2012 0853   BILITOT 0.40 03/28/2013 1107   BILITOT 0.8 06/06/2012 0853    PATHOLOGY 06/20/13: ADDITIONAL INFORMATION: PROGNOSTIC INDICATORS - ACIS Results: IMMUNOHISTOCHEMICAL AND MORPHOMETRIC ANALYSIS BY THE AUTOMATED CELLULAR IMAGING SYSTEM (ACIS) Estrogen Receptor: 0%, NEGATIVE Progesterone Receptor: 0%, NEGATIVE Proliferation Marker Ki67: 95% COMMENT: The negative hormone receptor study(ies) in this case have an internal positive control. REFERENCE RANGE ESTROGEN RECEPTOR NEGATIVE <1% POSITIVE =>1% PROGESTERONE RECEPTOR NEGATIVE <1% POSITIVE =>1% All controls stained appropriately Jaime Cutter MD Pathologist, Electronic Signature ( Signed 06/28/2012) CHROMOGENIC IN-SITU HYBRIDIZATION Results: HER-2/NEU BY CISH - NO AMPLIFICATION OF HER-2 DETECTED. RESULT RATIO OF HER2: CEP 17 SIGNALS 0.94 1 of 3 Duplicate copy FINAL for TAMA, GROSZ [phone]) ADDITIONAL INFORMATION:(continued) AVERAGE HER2 COPY NUMBER PER CELL 2.45 REFERENCE RANGE NEGATIVE HER2/Chr17 Ratio <2.0 and Average HER2 copy number <4.0 EQUIVOCAL HER2/Chr17 Ratio <2.0 and Average HER2 copy number 4.0 and <6.0 POSITIVE HER2/Chr17 Ratio >=2.0 and/or Average HER2 copy number >=6.0 Jaime Laws MD Pathologist, Electronic Signature ( Signed 06/26/2012) FINAL DIAGNOSIS Diagnosis Breast, right, needle core biopsy, 3:00 position - INVASIVE DUCTAL CARCINOMA SEE COMMENT. - DUCTAL CARCINOMA IN SITU Microscopic Comment Although the grade of tumor is best assessed at resection, with these biopsies, both the invasive and in situ carcinoma are grade III. Breast prognostic studies are pending and will be reported in an addendum. The case is reviewed with Dr. Avis Epley who  concurs. (CRR:gt, 06/21/12) Jaime RUND DO Pathologist, Electronic FINAL DIAGNOSIS Diagnosis Breast, right, needle core biopsy, mass - BENIGN BREAST TISSUE WITH FOCAL PSEUDOANGIOMATOUS STROMAL HYPERPLASIA ([place]). NO EVIDENCE OF MALIGNANCY. Microscopic Comment Called to the [facility] on 07/13/12. (JDP:caf 07/13/12)  RADIOGRAPHIC STUDIES:  Dg Chest 2 View  07/04/2012   *RADIOLOGY REPORT*  Clinical Data: Insertion of Port-A-Cath.  Breast cancer. Hypertension.  CHEST - 2 VIEW  Comparison: CT of the chest on 07/03/2012  Findings: Cardiomediastinal silhouette is within normal limits. The lungs are free of focal consolidations and pleural effusions. Bony structures have a normal appearance.  Contrast is identified within the colon following CT of the abdomen.  IMPRESSION: Negative exam.   Original Report Authenticated By: Nolon Nations, M.D.   Ct Chest W Contrast  07/03/2012   *RADIOLOGY REPORT*  Clinical Data:  Breast cancer.  No therapy yet.  Lower inner quadrant of right breast.  CT CHEST, ABDOMEN AND PELVIS WITH CONTRAST  Technique: Contiguous axial images of the chest abdomen and pelvis were obtained after IV contrast administration.  Contrast: 80 ml Omnipaque-300  Comparison: Breast MR of 06/26/2012.  No prior CTs.  CT CHEST  Findings: Lung windows demonstrate minimal left lower lobe subpleural nodularity at 3 mm on image 40/series 4.  Right lung clear.  Soft tissue windows demonstrate small bilateral axillary nodes, without adenopathy.  No subpectoral adenopathy. No supraclavicular adenopathy.  Normal heart size, without pericardial effusion.  No central pulmonary embolism, on this non-dedicated study.  No mediastinal or hilar adenopathy.  No internal mammary adenopathy. Minimal residual thymic tissue in the anterior mediastinum (image 17/series 2).  IMPRESSION:  1. No acute process or evidence of metastatic disease in the chest. 2.  Probable subpleural lymph node in the left lower  lobe. Recommend attention on follow-up.  CT ABDOMEN AND  PELVIS  Findings:  Mild early phase of hepatic enhancement which decreases sensitivity for focal liver lesions.  None identified.  Normal spleen, stomach, pancreas, gallbladder, biliary tract, adrenal glands, kidneys. No retroperitoneal or retrocrural adenopathy.  Normal colon, appendix, and terminal ileum.  Normal small bowel without abdominal ascites.  No pelvic adenopathy.    Normal urinary bladder and uterus.  No adnexal mass.  Trace free pelvic fluid is likely physiologic.  Transitional right-sided lumbosacral anatomy.  Degenerative disc disease at the lumbosacral junction.  IMPRESSION:  No acute process or evidence of metastatic disease in the abdomen or pelvis.   Original Report Authenticated By: Abigail Miyamoto, M.D.   US Breast Right  06/29/2012   *RADIOLOGY REPORT*  Clinical Data:  53 year old female with newly diagnosed right breast cancer.  Suspicious abnormal enhancement anterior and superior to the biopsy-proven neoplasm - for second look ultrasound evaluation.  RIGHT BREAST ULTRASOUND  Comparison:  Prior mammograms, ultrasounds and MRI.  Findings: Ultrasound is performed, showing the biopsy-proven neoplasm at the 3 o'clock position of the right breast 4 cm from the nipple. No other discrete sonographic abnormalities are identified, specifically medial, superior, and anterior to the biopsy-proven neoplasm.  IMPRESSION: Patchy abnormal enhancement medial superior to the biopsy-proven neoplasm identified on recent MRI is not visualized sonographically as a discrete abnormality.  MR guided biopsy is recommended if breast conservation surgery is considered.  BI-RADS CATEGORY 6:  Known biopsy-proven malignancy - appropriate action should be taken.  RECOMMENDATION: MR guided right breast biopsy, which will be scheduled by our office.  I have discussed the findings and recommendations with the patient. Results were also provided in writing at the  conclusion of the visit.  If applicable, a reminder letter will be sent to the patient regarding the next appointment.   Original Report Authenticated By: Margarette Canada, M.D.   US Breast Right  06/20/2012   *RADIOLOGY REPORT*  Clinical Data:  53 year old female for annual bilateral mammograms and palpable mass in the upper and inner right breast discovered on clinical examination.  DIGITAL DIAGNOSTIC BILATERAL MAMMOGRAM WITH CAD AND RIGHT BREAST ULTRASOUND:  Comparison:  03/21/2009 and prior mammograms dating back to 01/21/2005  Findings:  ACR Breast Density Category 4: The breast tissue is extremely dense.  Routine and spot compression views of the right breast demonstrate no evidence of suspicious mass or distortion. Scattered punctate calcifications within the breasts bilaterally are again identified.  Mammographic images were processed with CAD.  On physical exam, thickening is identified at the 12 o'clock position of the right breast 4 cm from the nipple. A firm palpable mass identified at the 3 o'clock position of the right breast 4 cm from the nipple.  Ultrasound is performed, showing no evidence of solid or cystic mass, distortion or abnormal areas of shadowing at the 12 o'clock position of the right breast. A 7 x 6 x 17 mm slightly irregular oval hypoechoic mass with calcifications is horizontally oriented at the 3 o'clock position of the right breast 4 cm from the nipple.  IMPRESSION: Indeterminate 7 x 6 x 17 mm hypoechoic mass at the 3 o'clock position of the right breast.  Tissue sampling is recommended.  No mammographic evidence of breast malignancy bilaterally.  No mammographic, palpable or sonographic abnormality at the 12 o'clock position of the right breast, in the area of palpable concern.  BI-RADS CATEGORY 4:  Suspicious abnormality - biopsy should be considered.  RECOMMENDATION:  Ultrasound guided right breast biopsy, which will be performed today but  dictated in a separate report.  I have  discussed the findings and recommendations with the patient. Results were also provided in writing at the conclusion of the visit.  If applicable, a reminder letter will be sent to the patient regarding the next appointment.   Original Report Authenticated By: Margarette Canada, M.D.   Ct Abdomen Pelvis W Contrast  07/03/2012   *RADIOLOGY REPORT*  Clinical Data:  Breast cancer.  No therapy yet.  Lower inner quadrant of right breast.  CT CHEST, ABDOMEN AND PELVIS WITH CONTRAST  Technique: Contiguous axial images of the chest abdomen and pelvis were obtained after IV contrast administration.  Contrast: 80 ml Omnipaque-300  Comparison: Breast MR of 06/26/2012.  No prior CTs.  CT CHEST  Findings: Lung windows demonstrate minimal left lower lobe subpleural nodularity at 3 mm on image 40/series 4.  Right lung clear.  Soft tissue windows demonstrate small bilateral axillary nodes, without adenopathy.  No subpectoral adenopathy. No supraclavicular adenopathy.  Normal heart size, without pericardial effusion.  No central pulmonary embolism, on this non-dedicated study.  No mediastinal or hilar adenopathy.  No internal mammary adenopathy. Minimal residual thymic tissue in the anterior mediastinum (image 17/series 2).  IMPRESSION:  1. No acute process or evidence of metastatic disease in the chest. 2.  Probable subpleural lymph node in the left lower lobe. Recommend attention on follow-up.  CT ABDOMEN AND PELVIS  Findings:  Mild early phase of hepatic enhancement which decreases sensitivity for focal liver lesions.  None identified.  Normal spleen, stomach, pancreas, gallbladder, biliary tract, adrenal glands, kidneys. No retroperitoneal or retrocrural adenopathy.  Normal colon, appendix, and terminal ileum.  Normal small bowel without abdominal ascites.  No pelvic adenopathy.    Normal urinary bladder and uterus.  No adnexal mass.  Trace free pelvic fluid is likely physiologic.  Transitional right-sided lumbosacral anatomy.   Degenerative disc disease at the lumbosacral junction.  IMPRESSION:  No acute process or evidence of metastatic disease in the abdomen or pelvis.   Original Report Authenticated By: Abigail Miyamoto, M.D.   Mr Breast Right W Wo Contrast  07/12/2012   *RADIOLOGY REPORT*  Clinical Data:  Known invasive mammary carcinoma in the right breast at 3 o'clock posteriorly.  Additional site of abnormal enhancement in the right upper inner quadrant closer to the nipple for possible biopsy.  ATTEMPTED MRI GUIDED VACUUM ASSISTED BIOPSY OF THE RIGHT BREAST WITHOUT AND WITH CONTRAST  Comparison: Previous exams.  Technique: Multiplanar, multisequence MR images of the right breast were obtained prior to and following the intravenous administration of 9 ml of Mulithance.  I met with the patient, and we discussed the procedure of MRI guided biopsy, including risks, benefits, and alternatives. Specifically, we discussed the risks of infection, bleeding, tissue injury, clip migration, and inadequate sampling.  Informed, written consent was given.  Using sterile technique, 2% Lidocaine, MRI guidance, and a 9 gauge vacuum assisted device, biopsy was attempted using a mediolateral approach.  However, the procedure was unsuccessful due to the extremely thin breast compression.  IMPRESSION:  Unsuccessful MRI guided vacuum-assisted biopsy of the right breast.  Patient was sent to the [facility] for further evaluation with ultrasound.   Original Report Authenticated By: Ulyess Blossom, M.D.   Mr Breast Bilateral W Wo Contrast  06/26/2012   *RADIOLOGY REPORT*  Clinical Data: History of new diagnosis of breast cancer from recent ultrasound guided core needle biopsy right breast 06/20/2012 demonstrating invasive ductal carcinoma with DCIS.  GFR 89, creatinine 0.7 on  06/06/2012.  BILATERAL BREAST MRI WITH AND WITHOUT CONTRAST  Technique: Multiplanar, multisequence MR images of both breasts were obtained prior to and following the intravenous  administration of 40ml of multihance.  Three dimensional images were evaluated at the independent DynaCad workstation.  Comparison:  Recent mammograms and ultrasound.  Findings: Examination demonstrates mild background parenchymal enhancement.  Right breast:  There is evidence of an ovoid gently lobulated and somewhat ill-defined heterogeneously enhancing mass over the deep third of the inner midportion of the right breast representing patient's biopsy-proven malignancy.  This mass contains central clip artifact and measures approximately 0.9 x 1.9 x 1.0 cm. The anterior border of this mass is located 5.5 cm from the nipple. There is a 6 mm slightly ill-defined enhancing ovoid mass 6 mm posterior lateral to the biopsy-proven malignancy lying just superficial to the pectoralis muscle.  There is also clumped non mass enhancement anterior and slightly superior to the biopsy- proven malignancy in the middle third at approximately the 2 o'clock position.  This measures approximately 0.8 x 1.6 x 1.2 cm and is located 3 cm from the nipple.  This is suspicious for multifocal disease.  No suspicious axillary or internal mammary lymph nodes.  Left breast:  No suspicious mass, enhancement or adenopathy.  IMPRESSION: Known biopsy-proven malignancy over the deep third of the inner mid right breast.  6 mm ill-defined enhancing ovoid mass 6 mm posterior lateral to the biopsy-proven malignancy and lying just superficial to the pectoralis muscle.  Clumped non mass enhancement anterior and superior to patient's known malignancy at approximately the 2 o'clock position measuring 0.8 x 1.6 x 2.2 cm.  Findings likely represent multi focal disease.  RECOMMENDATION: Recommend second look ultrasound and core biopsy if feasible of the clumped non mass enhancement at approximately the 2 o'clock position right breast anterior/superior to the known malignancy. If not seen by ultrasound, recommend proceeding to MR guided biopsy of this  suspicious abnormality.  THREE-DIMENSIONAL MR IMAGE RENDERING ON INDEPENDENT WORKSTATION:  Three-dimensional MR images were rendered by post-processing of the original MR data on an independent workstation.  The three- dimensional MR images were interpreted, and findings were reported in the accompanying complete MRI report for this study.  BI-RADS CATEGORY 0:  Incomplete.  Need additional imaging evaluation and/or prior mammograms for comparison.   Original Report Authenticated By: Marin Olp, M.D.   Dg Chest Port 1 View  07/06/2012   *RADIOLOGY REPORT*  Clinical Data: Post left-sided port catheter insertion  PORTABLE CHEST - 1 VIEW  Comparison: 07/04/2012  Findings:  Grossly unchanged cardiac silhouette and mediastinal contours. Interval placement of a left anterior chest wall subclavian vein approach port-a-catheter with tip projecting over the superior cavoatrial junction. Grossly unchanged symmetric biapical pleural parenchymal thickening.  No pneumothorax.  No focal airspace opacities.  No pleural effusion.  No evidence of edema.  Unchanged bones.  IMPRESSION: Interval placement of a left anterior chest wall subclavian vein approach port-a-catheter with tip overlying the superior cavoatrial junction.  No pneumothorax.   Original Report Authenticated By: Jake Seats, MD   Mm Digital Diagnostic Bilat  06/20/2012   *RADIOLOGY REPORT*  Clinical Data:  53 year old female for annual bilateral mammograms and palpable mass in the upper and inner right breast discovered on clinical examination.  DIGITAL DIAGNOSTIC BILATERAL MAMMOGRAM WITH CAD AND RIGHT BREAST ULTRASOUND:  Comparison:  03/21/2009 and prior mammograms dating back to 01/21/2005  Findings:  ACR Breast Density Category 4: The breast tissue is extremely dense.  Routine and  spot compression views of the right breast demonstrate no evidence of suspicious mass or distortion. Scattered punctate calcifications within the breasts bilaterally are again  identified.  Mammographic images were processed with CAD.  On physical exam, thickening is identified at the 12 o'clock position of the right breast 4 cm from the nipple. A firm palpable mass identified at the 3 o'clock position of the right breast 4 cm from the nipple.  Ultrasound is performed, showing no evidence of solid or cystic mass, distortion or abnormal areas of shadowing at the 12 o'clock position of the right breast. A 7 x 6 x 17 mm slightly irregular oval hypoechoic mass with calcifications is horizontally oriented at the 3 o'clock position of the right breast 4 cm from the nipple.  IMPRESSION: Indeterminate 7 x 6 x 17 mm hypoechoic mass at the 3 o'clock position of the right breast.  Tissue sampling is recommended.  No mammographic evidence of breast malignancy bilaterally.  No mammographic, palpable or sonographic abnormality at the 12 o'clock position of the right breast, in the area of palpable concern.  BI-RADS CATEGORY 4:  Suspicious abnormality - biopsy should be considered.  RECOMMENDATION:  Ultrasound guided right breast biopsy, which will be performed today but dictated in a separate report.  I have discussed the findings and recommendations with the patient. Results were also provided in writing at the conclusion of the visit.  If applicable, a reminder letter will be sent to the patient regarding the next appointment.   Original Report Authenticated By: Margarette Canada, M.D.   Mm Digital Diagnostic Unilat R  07/12/2012   *RADIOLOGY REPORT*  Clinical Data:  Ultrasound-guided core needle biopsy of a hypoechoic area at 2 o'clock 2 cm from the right nipple with clip placement.  Known invasive mammary carcinoma in the right breast. Unsuccessful right breast MRI guided biopsy earlier today.  DIGITAL DIAGNOSTIC RIGHT MAMMOGRAM  Comparison:  Previous exams.  Findings:  Films are performed following ultrasound guided biopsy of a small hypoechoic area at 2 o'clock 2 cm from the right nipple. The ribbon  clip is positioned within the area of concern. The ribbon clip is approximately 2.6 cm anterior to the top hat clip from the original biopsy on 06/20/2012.  IMPRESSION: Appropriate clip placement following ultrasound-guided core needle biopsy of a hypoechoic area at 2 o'clock 2 cm in the right nipple.   Original Report Authenticated By: Ulyess Blossom, M.D.   Mm Digital Diagnostic Unilat R  06/20/2012   *RADIOLOGY REPORT*  Clinical Data:  53 year old female - evaluate clip placement following ultrasound guided right breast biopsy.  DIGITAL DIAGNOSTIC RIGHT MAMMOGRAM  Comparison:  Previous exams.  Findings:  Films are performed following ultrasound guided biopsy of the 7 x 6 x 17 mm hypoechoic mass at the 3 o'clock position of the right breast 4 cm from the nipple. The T shaped biopsy clip is in satisfactory position.  IMPRESSION: Satisfactory clip placement following ultrasound guided right breast biopsy.  Pathology will be followed.   Original Report Authenticated By: Margarette Canada, M.D.   Dg Fluoro Guide Cv Line-no Report  07/06/2012   CLINICAL DATA: port placement   FLOURO GUIDE CV LINE  Fluoroscopy was utilized by the requesting physician.  No radiographic  interpretation.    [place] Rt Breast Bx W Loc Dev 1st Lesion Img Bx Spec US Guide  07/13/2012   **ADDENDUM** CREATED: 07/13/2012 14:11:45  I spoke with the patient by telephone on 07/13/2012 to discuss pathology results.  Pathology demonstrates  pseudoangiomatous stromal hyperplasia.  The findings were called to the office of Dr. Barry Dienes. Although the imaging findings could be related to pseudoangiomatous stromal hyperplasia, excision of the anterior biopsy site is suggested at the time of patient's definitive surgery.  The patient is considering unilateral or bilateral mastectomy at this time.  The patient reports no problems at the biopsy site.  All questions were answered.  Recommendations:  Excision of the biopsy site. The patient is scheduled to begin  chemotherapy in 4 days.  **END ADDENDUM** SIGNED BY: Hermenia Fiscal. Owens Shark, M.D.  07/12/2012   *RADIOLOGY REPORT*  Clinical Data:  Unsuccessful MRI guided biopsy of the area of abnormal enhancement in the left anterior upper inner quadrant, anterior to the known invasive mammary carcinoma.  Second look ultrasound performed and a 5 x 4 x 4 mm hypoechoic area found at 2 o'clock 2 cm from the right nipple which will be biopsied.  ULTRASOUND GUIDED VACUUM ASSISTED CORE BIOPSY OF THE RIGHT BREAST  Sonography of the right upper inner quadrant was performed.  There is an ill-defined hypoechoic 5 x 4 x 4 mm area at 2 o'clock 2 cm from the right nipple which will be biopsied with ultrasound guidance.  The patient and I discussed the procedure of ultrasound-guided biopsy, including benefits and alternatives.  We discussed the high likelihood of a successful procedure. We discussed the risks of the procedure including infection, bleeding, tissue injury, clip migration, and inadequate sampling.  Written informed consent was given.  Using sterile technique, 2% lidocaine, ultrasound guidance, and a 12 gauge vacuum assisted needle, biopsy was performed of the hypoechoic area at 2 o'clock 2 cm from the right nipple using a caudocranial approach.  At the conclusion of the procedure, a within tissue marker clip was deployed into the biopsy cavity. Follow-up 2-view mammogram was performed and dictated separately.  IMPRESSION: Ultrasound-guided biopsy of a 5 x 4 x 4 mm hypoechoic area at 2 o'clock 2 cm from the right nipple.  No apparent complications.   Original Report Authenticated By: Ulyess Blossom, M.D.   [place] Rt Breast Bx W Loc Dev 1st Lesion Img Bx Spec US Guide  06/21/2012   *RADIOLOGY REPORT*  Clinical Data:  Indeterminate 7 x 6 x 17 mm hypoechoic mass in the inner right breast - for tissue sampling.  ULTRASOUND GUIDED VACUUM ASSISTED CORE BIOPSY OF THE RIGHT BREAST  Comparison: Previous exams.  I met with the patient and we  discussed the procedure of ultrasound- guided biopsy, including benefits and alternatives.  We discussed the high likelihood of a successful procedure. We discussed the risks of the procedure including infection, bleeding, tissue injury, clip migration, and inadequate sampling.  Informed written consent was given.  Using sterile technique, 2% lidocaine ultrasound guidance and a 12 gauge vacuum assisted needle biopsy was performed of the 7 x 6 x 17 mm hypoechoic mass at the 3 o'clock position of the right breast 4 cm from the nipple using a lateral approach.  At the conclusion of the procedure, a T shaped tissue marker clip was deployed into the biopsy cavity.  Follow-up 2-view mammogram was performed and dictated separately.  IMPRESSION: Ultrasound-guided biopsy of right breast mass.  No apparent complications.  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 Histology correlates with imaging findings.  The patient was contacted by phone on 06/21/2012 and these results given to her which she understood. Her questions were answered. She was encouraged to obtain breast cancer educational material from the [facility]  at her convenience. The patient had no complaints with her biopsy site.  Recommend surgery/oncology consultation.  An appointment at the [facility] has been scheduled for 06/28/2012. Recommend bilateral breast MRI, which has been scheduled for 06/26/2012. The patient was informed of these appointments.   Original Report Authenticated By: Margarette Canada, M.D.    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 Patient had other areas seen on MRI biopsy of the second mass was negative. Patient has had her Port-A-Cath placed by Dr. Barry Dienes.    #2 S/P neoadjuvant chemotherapy to possibly conserve her breast. Chemotherapy consisted of Adriamycin Cytoxan given dose dense for 4 cycles starting on  07/17/2012 with Neulasta support.  This was followed by Taxol/Carbo which started on 09/11/12.  She received 7 cycles of this and it was discontinued early due to neuropathy.  She then received Gemzar/carbo beginning 11/06/12. She received 3 cycles of this therapy and tolerated it well.    #3 status post right mastectomy with the final pathology revealing no evidence of residual disease. She has had any reconstruction.  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  #5 Neuropathy:  Gabapentin TID.  And cymbalta  #6. Follow up: She'll be seen back in followup in 3 months time with blood work and office visit.  All questions were answered. The patient knows to call the clinic with any problems, questions or concerns. We can certainly see the patient much sooner if necessary.  I spent 20 minutes counseling the patient face to face. The total time spent in the appointment was 30 minutes.  Marcy Panning, MD Medical/Oncology [facility] [phone] (beeper) [phone] (Office)  03/28/2013, 12:34 PM

## 2013-04-01 ENCOUNTER — Encounter: Payer: Self-pay | Admitting: Oncology

## 2013-04-02 ENCOUNTER — Telehealth: Payer: Self-pay | Admitting: *Deleted

## 2013-04-02 NOTE — Telephone Encounter (Signed)
No answer

## 2013-04-02 NOTE — Telephone Encounter (Signed)
Pt notified of lab results, K 3.4, instructed to increase intake of potassium rich foods per Dr Humphrey Rolls. Pt verbalized understanding

## 2013-04-09 ENCOUNTER — Ambulatory Visit (INDEPENDENT_AMBULATORY_CARE_PROVIDER_SITE_OTHER): Payer: Medicaid Other | Admitting: General Surgery

## 2013-04-09 ENCOUNTER — Encounter (INDEPENDENT_AMBULATORY_CARE_PROVIDER_SITE_OTHER): Payer: Self-pay | Admitting: General Surgery

## 2013-04-09 VITALS — BP 110/72 | HR 78 | Temp 98.6°F | Resp 14 | Ht 63.0 in | Wt 120.3 lb

## 2013-04-09 DIAGNOSIS — C50319 Malignant neoplasm of lower-inner quadrant of unspecified female breast: Secondary | ICD-10-CM

## 2013-04-11 NOTE — Assessment & Plan Note (Signed)
No evidence of surgical complications.    Follow up in 3 months or earlier if needed.

## 2013-04-11 NOTE — Progress Notes (Signed)
HISTORY: Pt is a 53 yo F with right breast cancer around 3 weeks s/p right mastectomy.  She as found to have no residual disease.      EXAM: General:  Alert and oriented.   Incision:  Healing well.     PATHOLOGY: Diagnosis Breast, simple mastectomy, Right - BREAST PARENCHYMA WITH NEOADJUVANT CHANGES AND PSEUDOANGIOMATOUS STROMAL HYPERPLASIA. - FAT NECROSIS PRESENT. - BENIGN SKIN AND NIPPLE. - NO ATYPIA OR MALIGNANCY IDENTIFIED. - SEE COMMEN   ASSESSMENT AND PLAN:   Cancer of lower-inner quadrant of RIGHT female breast No evidence of surgical complications.    Follow up in 3 months or earlier if needed.          Milus Height, MD Surgical Oncology, [facility]  Elyn Peers, MD Lucianne Lei, MD

## 2013-05-04 ENCOUNTER — Encounter: Payer: Self-pay | Admitting: *Deleted

## 2013-05-04 NOTE — Progress Notes (Signed)
This RN faxed a dispensing order to [facility] [employer] [facility] for a [id number] or [id number].

## 2013-05-11 ENCOUNTER — Telehealth (INDEPENDENT_AMBULATORY_CARE_PROVIDER_SITE_OTHER): Payer: Self-pay

## 2013-05-11 NOTE — Telephone Encounter (Signed)
Pt called stating she had consult with Dr Irene Limbo plastic surgeon. Pt states Dr Iran Planas has been able to get other mastectomy and reconstruction approved thru medicaid. Pt wants Dr Barry Dienes to do the mastectomy part. Dr Iran Planas advised pt that [facility] and reconstruction could be done by plastics. Pt wanting to know what she should do. She states our office could not get prof Hassell Done approved thru [place]. Pt is asking for Dr Barry Dienes to please call and speak with Dr Para Skeans office re: how to get approval for [facility] with Dr Barry Dienes. Dr Para Skeans office # is [phone]. Pager # is [phone].

## 2013-05-12 NOTE — Telephone Encounter (Signed)
I discussed with Dr. Iran Planas.  I have sent orders through for surgery.  If we have difficulty getting it approved, we will appeal.

## 2013-05-18 ENCOUNTER — Other Ambulatory Visit (HOSPITAL_COMMUNITY): Payer: Self-pay | Admitting: General Surgery

## 2013-05-25 ENCOUNTER — Ambulatory Visit (INDEPENDENT_AMBULATORY_CARE_PROVIDER_SITE_OTHER): Payer: Medicaid Other | Admitting: Nurse Practitioner

## 2013-05-25 ENCOUNTER — Encounter: Payer: Self-pay | Admitting: Nurse Practitioner

## 2013-05-25 VITALS — BP 105/76 | HR 73 | Ht 63.0 in | Wt 122.0 lb

## 2013-05-25 DIAGNOSIS — G629 Polyneuropathy, unspecified: Secondary | ICD-10-CM

## 2013-05-25 DIAGNOSIS — G609 Hereditary and idiopathic neuropathy, unspecified: Secondary | ICD-10-CM

## 2013-05-25 NOTE — Progress Notes (Signed)
[facility]  PATIENT: Jaime Murillo DOB: 1960/10/31   REASON FOR VISIT: neuropathy from chemotherapy   HISTORY OF PRESENT ILLNESS: Jaime Murillo, 53 year old female returns for followup she was initially evaluated for peripheral neuropathy by Dr. Krista Blue 02/08/2013. History of carcinoma of the right breast and received chemotherapy consisting of Adriamycin,Cytoxan followed by weekly Taxol and carboplatinum, planned for a total of 12 weeks, beginning 09/11/2012. After the third treatment she began to notice her finger tips with numbness and tingling and dropping things. Later she began to notice the same symptoms in her toes and feet. She was originally given gabapentin without improvement then Lyrica without improvement and she is now on Cymbalta 60 mg daily. Sometimes she does not take the medication. She is due to have left breast surgery in about a month. She has recently been placed on OxyContin after her reconstruction surgery. She takes Ambien at night to help her sleep. She returns for reevaluation.   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 Tumor was grade 3.  She began to receive chemotherapy, initially consistent of Adriamycin,Cytoxan followed by weekly Taxol and carboplatinum, planned for a total of 12 weeks, beginning 09/11/2012.  After third treatment in early Sep 2014, she began to notice fingertips paresthesia, numbness tingling, drop things out of her hands, could not feel things with her fingertips, later also noticed bilateral toes, sole paresthesia, instead of getting the planned 12 treatment, she only got 6 treatment because progressive worsening bilateral feet and hands paresthesia.  She also described  generalized weakness, nausea, legs out underneath her, has fell few times. She denies significant low back pain, no bowel bladder incontinence  She was given gabapentin 300 mg in the morning, 400 mg at nighttime, with no significant improvement  She is planning on to have double mastectomy followed by reconstruction surgery in February 2015 right axillary lymph node biopsy was negative,    REVIEW OF SYSTEMS: Full 14 system review of systems performed and notable only for those listed, all others are neg:  Constitutional: N/A  Cardiovascular: N/A  Ear/Nose/Throat: N/A  Skin: N/A  Eyes: N/A  Respiratory: N/A  Gastroitestinal: Constipation Hematology/Lymphatic: N/A  Endocrine: N/A Musculoskeletal:N/A  Allergy/Immunology: N/A  Neurological:  Numbness Psychiatric: Depression anxiety Sleep frequent awakening   ALLERGIES: No Known Allergies  HOME MEDICATIONS: Outpatient Prescriptions Prior to Visit  Medication Sig Dispense Refill  . acetaminophen (TYLENOL) 500 MG tablet Take 1,000 mg by mouth every 6 (six) hours as needed for mild pain.      . B Complex-C (SUPER B COMPLEX PO) Take 1 tablet by mouth daily.       . baclofen (LIORESAL) 10 MG tablet Take 1 tablet (10 mg total) by mouth 3 (three) times daily as needed for muscle spasms.  30 each  0  . bisacodyl (DULCOLAX) 10 MG suppository Place 1 suppository (10 mg total) rectally daily as needed (no bowel movement).  12 suppository  0  . DULoxetine (CYMBALTA) 30 MG capsule One po qday xone week, then 2 tabs po qam  60 capsule  12  . ferrous sulfate 325 (65 FE) MG tablet Take 325 mg by mouth daily with breakfast.      . gabapentin (NEURONTIN) 300 MG capsule Take 300 mg by  mouth 3 (three) times daily.      . hydrochlorothiazide (HYDRODIURIL) 25 MG tablet Take 1 tablet (25 mg total) by mouth daily.  30 tablet  11  . ibuprofen (ADVIL,MOTRIN) 200 MG tablet Take 400 mg by mouth every 6 (six) hours as needed for moderate pain.      . meclizine  (ANTIVERT) 25 MG tablet Take 0.5-1 tablets (12.5-25 mg total) by mouth 3 (three) times daily as needed for dizziness (vertigo).  30 tablet  0  . polyethylene glycol (MIRALAX / GLYCOLAX) packet Take 17 g by mouth 2 (two) times daily.  14 each  0  . pregabalin (LYRICA) 50 MG capsule Take 2 capsules (100 mg total) by mouth 3 (three) times daily.  180 capsule  5  . prochlorperazine (COMPAZINE) 25 MG suppository Place 25 mg rectally every 12 (twelve) hours as needed for nausea (as needed for nausea and vomitting).      Marland Kitchen senna-docusate (SENOKOT-S) 8.6-50 MG per tablet Take 1 tablet by mouth daily.  30 tablet  0  . sulfamethoxazole-trimethoprim (BACTRIM DS) 800-160 MG per tablet Take 1 tablet by mouth 2 (two) times daily.  10 tablet  0  . traMADol (ULTRAM) 50 MG tablet Take 50 mg by mouth every 6 (six) hours as needed.      . valACYclovir (VALTREX) 500 MG tablet Take 500 mg by mouth daily.      Marland Kitchen VITAMIN E COMPLEX PO Take 1 tablet by mouth daily.       Marland Kitchen zolpidem (AMBIEN) 5 MG tablet Take 1 tablet (5 mg total) by mouth at bedtime as needed for sleep.  30 tablet  5   No facility-administered medications prior to visit.    PAST MEDICAL HISTORY: Past Medical History  Diagnosis Date  . HSV infection     takes Valtrex daily  . Urinary frequency   . Breast cancer 2014    triple negative  . Nausea     takes Compazine and Zofran as needed  . Anemia     takes Ferrous Sulfate daily  . Depression     takes Cymbalta daily  . Insomnia     takes Ambien nightly as needed  . Hypertension     takes HCTZ daily  . Dizziness     r/t meds  . Neuropathy     takes Lyrica daily and alternates with Gabapentin  . Joint pain   . Nocturia   . History of blood transfusion     no abnormal reaction noted  . Anxiety   . Vision blurred 02/2013  . Neuropathy of both feet     DUE TO CHEMO PER PATIENT    PAST SURGICAL HISTORY: Past Surgical History  Procedure Laterality Date  . Tubal ligation  2003    LTL  .  Portacath placement N/A 07/06/2012    Procedure: INSERTION PORT-A-CATH;  Surgeon: Stark Klein, MD;  Location: [place];  Service: General;  Laterality: N/A;  . Port-a-cath removal Left 01/12/2013    Procedure: REMOVAL PORT-A-CATH;  Surgeon: Stark Klein, MD;  Location: [place];  Service: General;  Laterality: Left;  . Breast lumpectomy Right 01/12/2013    Procedure: RIGHT SENTINEL LYMPH  NODE BIOPSY;  Surgeon: Stark Klein, MD;  Location: [place];  Service: General;  Laterality: Right;  RIGHT SENTINEL LYMPH NODE BIOPSY  . Colonoscopy    . Mastectomy  03/20/2013    WITH RECONSRTUCTION          DR  BYERLY   . Total mastectomy Right 03/20/2013    Procedure: RIGHT SKIN SPARING TOTAL MASTECTOMY;  Surgeon: Stark Klein, MD;  Location: [place];  Service: General;  Laterality: Right;  . Breast reconstruction with placement of tissue expander and flex hd (acellular hydrated dermis) Right 03/20/2013    Procedure: IMMEDIATE RIGHT BREAST RECONSTRUCTION WITH PLACEMENT OF TISSUE EXPANDER AND FLEX HD (ACELLULAR HYDRATED DERMIS);  Surgeon: Irene Limbo, MD;  Location: [place];  Service: Plastics;  Laterality: Right;    FAMILY HISTORY: Family History  Problem Relation Age of Onset  . Heart disease Mother   . Hypertension Father   . Prostate cancer Father 45  . Hypertension Sister   . Ovarian cancer Paternal Grandmother     died in her 36s  . Stomach cancer Maternal Grandmother 85  . Cancer Cousin     paternal cousin with an unknown form of cancer    SOCIAL HISTORY: History   Social History  . Marital Status: Single    Spouse Name: N/A    Number of Children: 2  . Years of Education: 13   Occupational History  .      Disabled   Social History Main Topics  . Smoking status: Never Smoker   . Smokeless tobacco: Never Used  . Alcohol Use: No  . Drug Use: No  . Sexual Activity: Yes    Birth Control/ Protection: Surgical     Comment: BTL   Other Topics Concern    . Not on file   Social History Narrative   Patient lives at home with her son she is single.   Disabled.   Education- One year of college.   Right handed.   Caffeine- One cup daily.     PHYSICAL EXAM  Filed Vitals:   05/25/13 0805  BP: 105/76  Pulse: 73  Height: 5\' 3"  (1.6 m)  Weight: 122 lb (55.339 kg)   Body mass index is 21.62 kg/(m^2).  Generalized: Well developed, in no acute distress  Head: normocephalic and atraumatic,. Oropharynx benign  Neck: Supple, no carotid bruits  Cardiac: Regular rate rhythm, no murmur  Musculoskeletal: No deformity   Neurological examination   Mentation: Alert oriented to time, place, history taking. Follows all commands speech and language fluent  Cranial nerve II-XII: Pupils were equal round reactive to light extraocular movements were full, visual field were full on confrontational test. Facial sensation and strength were normal. hearing was intact to finger rubbing bilaterally. Uvula tongue midline. head turning and shoulder shrug were normal and symmetric.Tongue protrusion into cheek strength was normal. Motor: normal bulk and tone, full strength in the BUE, BLE, fine finger movements normal, no pronator drift. No focal weakness Sensory: Decreased pinprick to mid shin, mild decreased vibratory to ankles, intact to fine touch and proprioception.  Coordination: finger-nose-finger, heel-to-shin bilaterally, no dysmetria Reflexes: Brachioradialis 2/2, biceps 2/2, triceps 2/2, patellar 2/2, Achilles 2/2, plantar responses were flexor bilaterally. Gait and Station: Rising up from seated position without assistance, mild atalgic gait,  able to perform tiptoe, and heel walking without difficulty. Tandem gait is unsteady  DIAGNOSTIC DATA (LABS, IMAGING, TESTING) - I reviewed patient records, labs, notes, testing and imaging myself where available.  Lab Results  Component Value Date   WBC 3.1* 03/28/2013   HGB 10.2* 03/28/2013   HCT 30.3*  03/28/2013   MCV 93.4 03/28/2013   PLT 242 03/28/2013      Component Value Date/Time   NA 138 03/28/2013 1107   NA 140  03/22/2013 0615   K 3.4* 03/28/2013 1107   K 4.2 03/22/2013 0615   CL 104 03/22/2013 0615   CL 104 07/17/2012 1357   CO2 31* 03/28/2013 1107   CO2 27 03/22/2013 0615   GLUCOSE 71 03/28/2013 1107   GLUCOSE 100* 03/22/2013 0615   GLUCOSE 93 07/17/2012 1357   BUN 14.7 03/28/2013 1107   BUN 6 03/22/2013 0615   CREATININE 0.9 03/28/2013 1107   CREATININE 0.65 03/22/2013 0615   CREATININE 0.70 06/06/2012 0853   CALCIUM 9.6 03/28/2013 1107   CALCIUM 8.9 03/22/2013 0615   PROT 7.3 03/28/2013 1107   PROT 6.9 06/06/2012 0853   ALBUMIN 3.7 03/28/2013 1107   ALBUMIN 3.9 06/06/2012 0853   AST 25 03/28/2013 1107   AST 14 06/06/2012 0853   ALT 14 03/28/2013 1107   ALT 9 06/06/2012 0853   ALKPHOS 52 03/28/2013 1107   ALKPHOS 35* 06/06/2012 0853   BILITOT 0.40 03/28/2013 1107   BILITOT 0.8 06/06/2012 0853   GFRNONAA >90 03/22/2013 0615   GFRAA >90 03/22/2013 0615   Lab Results  Component Value Date   CHOL 159 06/06/2012   HDL 73 06/06/2012   LDLCALC 78 06/06/2012   TRIG 42 06/06/2012   CHOLHDL 2.2 06/06/2012    Lab Results  Component Value Date   [id number]* 02/08/2013   Lab Results  Component Value Date   TSH 1.650 02/08/2013      ASSESSMENT AND PLAN  53 y.o. year old female  has a past medical history of  Breast cancer (2014); Depression; Insomnia; Hypertension; Neuropathy; Joint pain;  here to followup. She is currently on Cymbalta 60 milligrams daily. She has failed gabapentin and Lyrica in the past.  Continue Cybalta at current dose Add  Gabapentin 300mg  at hs it may help her sleep better F/U in 6 months  Dennie Bible, [facility], [facility], [facility] [address] [place], [place] [address] [phone]

## 2013-05-25 NOTE — Patient Instructions (Addendum)
Continue Cybalta at current dose May add back Gabapentin at later date F/U in 6 months

## 2013-06-05 ENCOUNTER — Telehealth (INDEPENDENT_AMBULATORY_CARE_PROVIDER_SITE_OTHER): Payer: Self-pay

## 2013-06-05 NOTE — Telephone Encounter (Addendum)
Verified Rx DOK PLUS 50-8.6mg  Senekot/Colace with pharmacy and one refill prescribed.  They will contact the patient.

## 2013-06-11 ENCOUNTER — Ambulatory Visit (INDEPENDENT_AMBULATORY_CARE_PROVIDER_SITE_OTHER): Payer: Medicaid Other | Admitting: Gynecology

## 2013-06-11 ENCOUNTER — Encounter: Payer: Self-pay | Admitting: Gynecology

## 2013-06-11 ENCOUNTER — Other Ambulatory Visit (HOSPITAL_COMMUNITY)
Admission: RE | Admit: 2013-06-11 | Discharge: 2013-06-11 | Disposition: A | Discharge status: Home / Self Care | Payer: Medicaid Other | Source: Ambulatory Visit | Attending: Gynecology | Admitting: Gynecology

## 2013-06-11 VITALS — BP 116/76 | Ht 65.0 in | Wt 118.0 lb

## 2013-06-11 DIAGNOSIS — Z853 Personal history of malignant neoplasm of breast: Secondary | ICD-10-CM

## 2013-06-11 DIAGNOSIS — Z01419 Encounter for gynecological examination (general) (routine) without abnormal findings: Secondary | ICD-10-CM

## 2013-06-11 DIAGNOSIS — Z124 Encounter for screening for malignant neoplasm of cervix: Secondary | ICD-10-CM

## 2013-06-11 DIAGNOSIS — R87810 Cervical high risk human papillomavirus (HPV) DNA test positive: Secondary | ICD-10-CM | POA: Insufficient documentation

## 2013-06-11 DIAGNOSIS — Z1151 Encounter for screening for human papillomavirus (HPV): Secondary | ICD-10-CM | POA: Insufficient documentation

## 2013-06-11 DIAGNOSIS — Z23 Encounter for immunization: Secondary | ICD-10-CM

## 2013-06-11 DIAGNOSIS — N951 Menopausal and female climacteric states: Secondary | ICD-10-CM

## 2013-06-11 NOTE — Addendum Note (Signed)
Addended by: Nelva Nay on: 06/11/2013 10:02 AM   Modules accepted: Orders

## 2013-06-11 NOTE — Progress Notes (Signed)
  Jaime Murillo 07/06/1960 [id number]   History:    52 y.o.  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 Tumor was grade 3 was made. Patient underwent right mastectomy on 04/17/2013. Patient also has completed chemotherapy. Patientunderwent sentinel node biopsy with Dr. Byerly on 01/12/13 to evaluate whether or not she was a candidate for immediate reconstruction following bilateral mastectomies. Sentinel nodes were negative for metastases and it was then decided that she did not need radiation. Patient will complete bilateral nipple sparing mastectomies with immediate reconstruction which he completed in February for the right breast and schedule the next few months for the left breast. Patient denies any prior history of abnormal Pap smears. She had a colonoscopy this year which she stated was normal. Her PCP is Dr.Bland who is monitoring her hyperlipidemia. Patient would like to receive her Tdap vaccine today.   The patient has been complaining of hot flashes several times during the day and night and vaginal dryness since she is sexually active.    Past medical history,surgical history, family history and social history were all reviewed and documented in the EPIC chart.  Gynecologic History Patient's last menstrual period was 06/25/2012. Contraception: tubal ligation Last Pap:  2012. Results were: normal Last mammogram:  See above. Results were: see above  Obstetric History OB History  Gravida Para Term Preterm AB SAB TAB Ectopic Multiple Living  3 2 2  1 1    2    # Outcome Date GA Lbr Len/2nd Weight Sex Delivery Anes PTL Lv  3 SAB           2 TRM     M SVD  N Y  1 TRM     M SVD  N Y       ROS: A ROS was performed and pertinent  positives and negatives are included in the history.  GENERAL: No fevers or chills. HEENT: No change in vision, no earache, sore throat or sinus congestion. NECK: No pain or stiffness. CARDIOVASCULAR: No chest pain or pressure. No palpitations. PULMONARY: No shortness of breath, cough or wheeze. GASTROINTESTINAL: No abdominal pain, nausea, vomiting or diarrhea, melena or bright red blood per rectum. GENITOURINARY: No urinary frequency, urgency, hesitancy or dysuria. MUSCULOSKELETAL: No joint or muscle pain, no back pain, no recent trauma. DERMATOLOGIC: No rash, no itching, no lesions. ENDOCRINE: No polyuria, polydipsia, no heat or cold intolerance. No recent change in weight. HEMATOLOGICAL: No anemia or easy bruising or bleeding. NEUROLOGIC: No headache, seizures, numbness, tingling or weakness. PSYCHIATRIC: No depression, no loss of interest in normal activity or change in sleep pattern.     Exam: chaperone present  BP 116/76  Ht 5' 5" (1.651 m)  Wt 118 lb (53.524 kg)  BMI 19.64 kg/m2  LMP 06/25/2012  Body mass index is 19.64 kg/(m^2).  General appearance : Well developed well nourished female. No acute distress HEENT: Neck supple, trachea midline, no carotid bruits, no thyroidmegaly Lungs: Clear to auscultation, no rhonchi or wheezes, or rib retractions  Heart: Regular rate and rhythm, no murmurs or gallops Breast:Examined in sitting and supine position were symmetrical in appearance, no palpable masses or tenderness,  no skin retraction, no nipple inversion, no nipple discharge, no skin discoloration, no axillary or supraclavicular lymphadenopathy   Abdomen: no palpable masses or tenderness, no rebound or guarding Extremities: no edema or skin discoloration or tenderness  Pelvic:  Bartholin, Urethra, Skene Glands: Within normal limits             Vagina: No gross lesions or discharge  Cervix: No gross lesions or discharge  Uterus  anteverted, normal size, shape and consistency, non-tender  and mobile  Adnexa  Without masses or tenderness  Anus and perineum  normal   Rectovaginal  normal sphincter tone without palpated masses or tenderness             Hemoccult  colonoscopy this year    51-year-old female with  diagnosis of T1 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 Patient scheduled for left breast mastectomy and reconstruction in the next few months. Patient has informed me that her BRCA1 and BRCA2 testing was negative but nevertheless we're going to screen her with an ultrasound of her pelvis to look at her ovaries once a year. Pap smear was done today in accordance to the new guidelines. We discussed the importance of calcium and vitamin D and regular exercise for osteoporosis prevention. Patient may use K-Y jelly for lubricant during intercourse. I presented her with 2 articles  as well as samples of Relizen which is an  herbal remedy to alleviate her hot flushes and is made from pollen extract and is non estrogenic (Femal) She will take 2 tablets daily. Patient received the Tdap vaccine today.  Assessment/Plan:  52 y.o. female  with  Note: This dictation was prepared with  Dragon/digital dictation along withSmart phrase technology. Any transcriptional errors that result from this process are unintentional.   Juan H Fernandez MD, 9:37 AM 06/11/2013    

## 2013-06-11 NOTE — Patient Instructions (Signed)
Tetanus, Diphtheria (Td) Vaccine What You Need to Know WHY GET VACCINATED? Tetanus  and diphtheria are very serious diseases. They are rare in the [place] today, but people who do become infected often have severe complications. Td vaccine is used to protect adolescents and adults from both of these diseases. Both tetanus and diphtheria are infections caused by bacteria. Diphtheria spreads from person to person through coughing or sneezing. Tetanus-causing bacteria enter the body through cuts, scratches, or wounds. TETANUS (Lockjaw) causes painful muscle tightening and stiffness, usually all over the body.  It can lead to tightening of muscles in the head and neck so you can't open your mouth, swallow, or sometimes even breathe. Tetanus kills about 1 out of every 5 people who are infected. DIPHTHERIA can cause a thick coating to form in the back of the throat.  It can lead to breathing problems, paralysis, heart failure, and death. Before vaccines, the [place] saw as many as 200,000 cases a year of diphtheria and hundreds of cases of tetanus. Since vaccination began, cases of both diseases have dropped by about 99%. TD VACCINE Td vaccine can protect adolescents and adults from tetanus and diphtheria. Td is usually given as a booster dose every 10 years but it can also be given earlier after a severe and dirty wound or burn. Your doctor can give you more information. Td may safely be given at the same time as other vaccines. SOME PEOPLE SHOULD NOT GET THIS VACCINE  If you ever had a life-threatening allergic reaction after a dose of any tetanus or diphtheria containing vaccine, OR if you have a severe allergy to any part of this vaccine, you should not get Td. Tell your doctor if you have any severe allergies.  Talk to your doctor if you:  have epilepsy or another nervous system problem,  had severe pain or swelling after any vaccine containing diphtheria or tetanus,  ever had  Guillain Barr Syndrome (GBS),  aren't feeling well on the day the shot is scheduled. RISKS OF A VACCINE REACTION With a vaccine, like any medicine, there is a chance of side effects. These are usually mild and go away on their own. Serious side effects are also possible, but are very rare. Most people who get Td vaccine do not have any problems with it. Mild Problems  following Td (Did not interfere with activities)  Pain where the shot was given (about 8 people in 10)  Redness or swelling where the shot was given (about 1 person in 3)  Mild fever (about 1 person in 15)  Headache or Tiredness (uncommon) Moderate Problems following Td (Interfered with activities, but did not require medical attention)  Fever over 102 F (38.9 C) (rare) Severe Problems  following Td (Unable to perform usual activities; required medical attention)  Swelling, severe pain, bleeding, or redness in the arm where the shot was given (rare). Problems that could happen after any vaccine:  Brief fainting spells can happen after any medical procedure, including vaccination. Sitting or lying down for about 15 minutes can help prevent fainting, and injuries caused by a fall. Tell your doctor if you feel dizzy, or have vision changes or ringing in the ears.  Severe shoulder pain and reduced range of motion in the arm where a shot was given can happen, very rarely, after a vaccination.  Severe allergic reactions from a vaccine are very rare, estimated at less than 1 in a million doses. If one were to occur, it would   usually be within a few minutes to a few hours after the vaccination. WHAT IF THERE IS A SERIOUS REACTION? What should I look for?  Look for anything that concerns you, such as signs of a severe allergic reaction, very high fever, or behavior changes. Signs of a severe allergic reaction can include hives, swelling of the face and throat, difficulty breathing, a fast heartbeat, dizziness, and  weakness. These would usually start a few minutes to a few hours after the vaccination. What should I do?  If you think it is a severe allergic reaction or other emergency that can't wait, call 911 or get the person to the nearest hospital. Otherwise, call your doctor.  Afterward, the reaction should be reported to the Vaccine Adverse Event Reporting System (VAERS). Your doctor might file this report, or, you can do it yourself through the VAERS website or by calling (781) 212-7882. VAERS is only for reporting reactions. They do not give medical advice. THE NATIONAL VACCINE INJURY COMPENSATION PROGRAM The National Vaccine Injury Compensation Program (VICP) is a federal program that was created to compensate people who may have been injured by certain vaccines. Persons who believe they may have been injured by a vaccine can learn about the program and about filing a claim by calling (407)395-9363 or visiting the [facility] website. HOW CAN I LEARN MORE?  Ask your doctor.  Contact your local or state health department.  Contact the Centers for Disease Control and Prevention (CDC):  Call 631-164-9540 (1-800-CDC-INFO)  Visit CDC's vaccines website CDC Td Vaccine Interim VIS (02/29/12) Document Released: 11/08/2005 Document Revised: 05/08/2012 Document Reviewed: 05/03/2012 [facility] Patient Information 2014 [place], [place]. Transvaginal Ultrasound Transvaginal ultrasound is a pelvic ultrasound, using a metal probe that is placed in the vagina, to look at a women's female organs. Transvaginal ultrasound is a method of seeing inside the pelvis of a woman. The ultrasound machine sends out sound waves from the transducer (probe). These sound waves bounce off body structures (like an echo) to create a picture. The picture shows up on a monitor. It is called transvaginal because the probe is inserted into the vagina. There should be very little discomfort from the vaginal probe. This test can also be used  during pregnancy. Endovaginal ultrasound is another name for a transvaginal ultrasound. In a transabdominal ultrasound, the probe is placed on the outside of the belly. This method gives pictures that are lower quality than pictures from the transvaginal technique. Transvaginal ultrasound is used to look for problems of the female genital tract. Some such problems include:  Infertility problems.  Congenital (birth defect) malformations of the uterus and ovaries.  Tumors in the uterus.  Abnormal bleeding.  Ovarian tumors and cysts.  Abscess (inflamed tissue around pus) in the pelvis.  Unexplained abdominal or pelvic pain.  Pelvic infection. DURING PREGNANCY, TRANSVAGINAL ULTRASOUND MAY BE USED TO LOOK AT:  Normal pregnancy.  Ectopic pregnancy (pregnancy outside the uterus).  Fetal heartbeat.  Abnormalities in the pelvis, that are not seen well with transabdominal ultrasound.  Suspected twins or multiples.  Impending miscarriage.  Problems with the cervix (incompetent cervix, not able to stay closed and hold the baby).  When doing an amniocentesis (removing fluid from the pregnancy sac, for testing).  Looking for abnormalities of the baby.  Checking the growth, development, and age of the fetus.  Measuring the amount of fluid in the amniotic sac.  When doing an external version of the baby (moving baby into correct position).  Evaluating the baby for  problems in high risk pregnancies (biophysical profile).  Suspected fetal demise (death). Sometimes a special ultrasound method called Saline Infusion Sonography (SIS) is used for a more accurate look at the uterus. Sterile saline (salt water) is injected into the uterus of non-pregnant patients to see the inside of the uterus better. SIS is not used on pregnant women. The vaginal probe can also assist in obtaining biopsies of abnormal areas, in draining fluid from cysts on the ovary, and in finding IUDs (intrauterine  device, birth control) that cannot be located. PREPARATION FOR TEST A transvaginal ultrasound is done with the bladder empty. The transabdominal ultrasound is done with your bladder full. You may be asked to drink several glasses of water before that exam. Sometimes, a transabdominal ultrasound is done just after a transvaginal ultrasound, to look at organs in your abdomen. PROCEDURE  You will lie down on a table, with your knees bent and your feet in foot holders. The probe is covered with a condom. A sterile lubricant is put into the vagina and on the probe. The lubricant helps transmit the sound waves and avoid irritating the vagina. Your caregiver will move the probe inside the vaginal cavity to scan the pelvic structures. A normal test will show a normal pelvis and normal contents. An abnormal test will show abnormalities of the pelvis, placenta, or baby. ABNORMAL RESULTS MAY BE DUE TO:  Growths or tumors in the:  Uterus.  Ovaries.  Vagina.  Other pelvic structures.  Non-cancerous growths of the uterus and ovaries.  Twisting of the ovary, cutting off blood supply to the ovary (ovarian torsion).  Areas of infection, including:  Pelvic inflammatory disease.  Abscess in the pelvis.  Locating an IUD. PROBLEMS FOUND IN PREGNANT WOMEN MAY INCLUDE:  Ectopic pregnancy (pregnancy outside the uterus).  Multiple pregnancies.  Early dilation (opening) of the cervix. This may indicate an incompetent cervix and early delivery.  Impending miscarriage.  Fetal death.  Problems with the placenta, including:  Placenta has grown over the opening of the womb (placenta previa).  Placenta has separated early in the womb (placental abruption).  Placenta grows into the muscle of the uterus (placenta accreta).  Tumors of pregnancy, including gestational trophoblastic disease. This is an abnormal pregnancy, with no fetus. The uterus is filled with many grape-like cysts that could sometimes  be cancerous.  Incorrect position of the fetus (breech, vertex).  Intrauterine fetal growth retardation (IUGR) (poor growth in the womb).  Fetal abnormalities or infection. RISKS AND COMPLICATIONS There are no known risks to the ultrasound procedure. There is no X-ray used when doing an ultrasound. Document Released: 12/24/2003 Document Revised: 04/05/2011 Document Reviewed: 12/11/2008 [facility] Patient Information 2014 [place], [place].

## 2013-06-27 ENCOUNTER — Telehealth (INDEPENDENT_AMBULATORY_CARE_PROVIDER_SITE_OTHER): Payer: Self-pay

## 2013-06-27 NOTE — Telephone Encounter (Signed)
LMOV.  Pt is scheduled for a simple right mastectomy.  This is typically an out patient procedure.  She does not have a complicated medical history.  If pt has concerns about not staying overnight she will need to speak with Dr. Barry Dienes.

## 2013-06-28 ENCOUNTER — Other Ambulatory Visit: Payer: Medicaid Other

## 2013-06-28 ENCOUNTER — Ambulatory Visit: Payer: Medicaid Other | Admitting: Gynecology

## 2013-07-04 ENCOUNTER — Encounter: Payer: Self-pay | Admitting: Gynecology

## 2013-07-04 ENCOUNTER — Ambulatory Visit (INDEPENDENT_AMBULATORY_CARE_PROVIDER_SITE_OTHER): Payer: Medicaid Other

## 2013-07-04 ENCOUNTER — Other Ambulatory Visit: Payer: Self-pay | Admitting: Gynecology

## 2013-07-04 ENCOUNTER — Ambulatory Visit (INDEPENDENT_AMBULATORY_CARE_PROVIDER_SITE_OTHER): Payer: Medicaid Other | Admitting: Gynecology

## 2013-07-04 VITALS — BP 130/84

## 2013-07-04 DIAGNOSIS — Z853 Personal history of malignant neoplasm of breast: Secondary | ICD-10-CM

## 2013-07-04 DIAGNOSIS — O34519 Maternal care for incarceration of gravid uterus, unspecified trimester: Secondary | ICD-10-CM

## 2013-07-04 DIAGNOSIS — D259 Leiomyoma of uterus, unspecified: Secondary | ICD-10-CM

## 2013-07-04 DIAGNOSIS — N854 Malposition of uterus: Secondary | ICD-10-CM

## 2013-07-04 DIAGNOSIS — D252 Subserosal leiomyoma of uterus: Secondary | ICD-10-CM

## 2013-07-04 DIAGNOSIS — D251 Intramural leiomyoma of uterus: Secondary | ICD-10-CM

## 2013-07-04 DIAGNOSIS — O34539 Maternal care for retroversion of gravid uterus, unspecified trimester: Secondary | ICD-10-CM

## 2013-07-04 NOTE — Progress Notes (Signed)
   Patient presented to the office today to discuss her ultrasound. Patient with history of breast cancer in 2014 history of fibroid uterus.   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 Tumor was grade 3 was made. Patient underwent right mastectomy on 04/17/2013. Patient also has completed chemotherapy. Patientunderwent sentinel node biopsy with Dr. Barry Dienes on 01/12/13 to evaluate whether or not she was a candidate for immediate reconstruction following bilateral mastectomies. Sentinel nodes were negative for metastases and it was then decided that she did not need radiation. Patient will complete bilateral nipple sparing mastectomies with immediate reconstruction which he completed in February for the right breast and schedule the next few months for the left breast.  Patient informed that she had a normal colonoscopy a few months ago. Ultrasound today:   Uterus measures 7.5 x 5.3 x 4.1 cm with an endometrial stripe of 2.3 mm. Patient with 4 small intramural and subserosal fibroids the largest one measuring 12 x 11 mm. Some fluid was noted in the cul-de-sac. No apparent adnexal masses. Right and left ovary were otherwise normal.  Assessment/plan: 53 year old female with diagnosis of T1 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 Patient scheduled for left breast mastectomy and reconstruction in the next few months. Patient has informed me that her BRCA1 and BRCA2 testing was negative. Normal always on ultrasound few small insignificant fibroids otherwise normal pelvic ultrasound today. We'll see patient in one year for anal exam or when necessary.

## 2013-07-11 DIAGNOSIS — Z853 Personal history of malignant neoplasm of breast: Secondary | ICD-10-CM | POA: Insufficient documentation

## 2013-07-17 ENCOUNTER — Telehealth: Payer: Self-pay | Admitting: Hematology and Oncology

## 2013-07-18 ENCOUNTER — Encounter (HOSPITAL_BASED_OUTPATIENT_CLINIC_OR_DEPARTMENT_OTHER): Payer: Self-pay | Admitting: *Deleted

## 2013-07-18 NOTE — Progress Notes (Signed)
Pt had rt mastectomy -te 2/15-she had to stay in hospital 3 days due to n/v dizziness-she is small-told her to hydrate well and hopefully we can control her nausea and fluids better post op. To bring overnight bag and meds

## 2013-07-19 ENCOUNTER — Encounter (HOSPITAL_BASED_OUTPATIENT_CLINIC_OR_DEPARTMENT_OTHER)
Admission: RE | Admit: 2013-07-19 | Discharge: 2013-07-19 | Disposition: A | Discharge status: Home / Self Care | Payer: Medicaid Other | Source: Ambulatory Visit | Attending: General Surgery | Admitting: General Surgery

## 2013-07-19 ENCOUNTER — Other Ambulatory Visit: Payer: Self-pay

## 2013-07-19 DIAGNOSIS — D249 Benign neoplasm of unspecified breast: Secondary | ICD-10-CM | POA: Diagnosis not present

## 2013-07-19 DIAGNOSIS — Z9010 Acquired absence of unspecified breast and nipple: Secondary | ICD-10-CM | POA: Diagnosis not present

## 2013-07-19 DIAGNOSIS — I10 Essential (primary) hypertension: Secondary | ICD-10-CM | POA: Diagnosis not present

## 2013-07-19 DIAGNOSIS — Z9221 Personal history of antineoplastic chemotherapy: Secondary | ICD-10-CM | POA: Diagnosis not present

## 2013-07-19 DIAGNOSIS — R922 Inconclusive mammogram: Secondary | ICD-10-CM | POA: Diagnosis present

## 2013-07-19 DIAGNOSIS — Z803 Family history of malignant neoplasm of breast: Secondary | ICD-10-CM | POA: Diagnosis not present

## 2013-07-19 DIAGNOSIS — Z4001 Encounter for prophylactic removal of breast: Secondary | ICD-10-CM | POA: Diagnosis not present

## 2013-07-19 DIAGNOSIS — Z01810 Encounter for preprocedural cardiovascular examination: Secondary | ICD-10-CM | POA: Insufficient documentation

## 2013-07-19 DIAGNOSIS — Z853 Personal history of malignant neoplasm of breast: Secondary | ICD-10-CM | POA: Diagnosis not present

## 2013-07-19 DIAGNOSIS — Z79899 Other long term (current) drug therapy: Secondary | ICD-10-CM | POA: Diagnosis not present

## 2013-07-19 LAB — CBC WITH DIFFERENTIAL/PLATELET
BASOS PCT: 0 % (ref 0–1)
Basophils Absolute: 0 10*3/uL (ref 0.0–0.1)
EOS PCT: 0 % (ref 0–5)
Eosinophils Absolute: 0 10*3/uL (ref 0.0–0.7)
HEMATOCRIT: 31.5 % — AB (ref 36.0–46.0)
Hemoglobin: 10.5 g/dL — ABNORMAL LOW (ref 12.0–15.0)
LYMPHS ABS: 1.2 10*3/uL (ref 0.7–4.0)
Lymphocytes Relative: 47 % — ABNORMAL HIGH (ref 12–46)
MCH: 29.8 pg (ref 26.0–34.0)
MCHC: 33.3 g/dL (ref 30.0–36.0)
MCV: 89.5 fL (ref 78.0–100.0)
MONO ABS: 0.2 10*3/uL (ref 0.1–1.0)
MONOS PCT: 8 % (ref 3–12)
Neutro Abs: 1.2 10*3/uL — ABNORMAL LOW (ref 1.7–7.7)
Neutrophils Relative %: 45 % (ref 43–77)
Platelets: 208 10*3/uL (ref 150–400)
RBC: 3.52 MIL/uL — AB (ref 3.87–5.11)
RDW: 13.2 % (ref 11.5–15.5)
WBC: 2.6 10*3/uL — ABNORMAL LOW (ref 4.0–10.5)

## 2013-07-19 LAB — BASIC METABOLIC PANEL
BUN: 17 mg/dL (ref 6–23)
CO2: 27 mEq/L (ref 19–32)
Calcium: 9.2 mg/dL (ref 8.4–10.5)
Chloride: 103 mEq/L (ref 96–112)
Creatinine, Ser: 0.75 mg/dL (ref 0.50–1.10)
GLUCOSE: 84 mg/dL (ref 70–99)
POTASSIUM: 4.1 meq/L (ref 3.7–5.3)
Sodium: 142 mEq/L (ref 137–147)

## 2013-07-19 NOTE — H&P (Signed)
  Patient ID: Jaime Murillo is a 53 y.o. female.   HPI  4 months post op right areola sparing mastectomy. Plan for opposite prophylactic areola sparing mastectomy with reconstruction given dense breasts and strong family history. Since last visit had to have multiple teeth removed due to loosening as post chemo effect, has one more tooth that needs to be removed that is loose.  Pathology with no residual carcinoma. 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 Responded to chemotherapy and resolved radiographically. SLN bx done prior to mastectomy and this was negative.   Since Dr Donzetta Matters left the [facility], pt is requesting referral to [facility] for oncology follow up.   34 B size, would like to be bigger.  Review of Systems   Objective:   Physical Exam  CV: regular, no murmur  PULM: clear bilaterally  GU: R reconstructed breast soft  Left breast  SN to nipple 23 cm  BW 12 cm  Nipple to IMF 6 cm   Assessment:   R breast cancer , family history breast cancer, dense breasts   Plan:   Left reconstruction with TE and acellular dermis. Risks including but not limited to bleeding, pain, seroma, hematoma, damage to deeper structures, need for additional surgery, asymmetry, wound healing problems, extrusion reviewed. Reviewed timing of bilateral second stage surgery.  R breast Mentor style 9200 tissue expander, 271ml  Fill volume 280 Ml

## 2013-07-24 ENCOUNTER — Encounter (HOSPITAL_BASED_OUTPATIENT_CLINIC_OR_DEPARTMENT_OTHER): Payer: Medicaid Other | Admitting: Anesthesiology

## 2013-07-24 ENCOUNTER — Ambulatory Visit (HOSPITAL_BASED_OUTPATIENT_CLINIC_OR_DEPARTMENT_OTHER): Payer: Medicaid Other | Admitting: Anesthesiology

## 2013-07-24 ENCOUNTER — Ambulatory Visit (HOSPITAL_BASED_OUTPATIENT_CLINIC_OR_DEPARTMENT_OTHER)
Admission: RE | Admit: 2013-07-24 | Discharge: 2013-07-25 | Disposition: A | Discharge status: Home / Self Care | Payer: Medicaid Other | Source: Ambulatory Visit | Attending: General Surgery | Admitting: General Surgery

## 2013-07-24 ENCOUNTER — Encounter (HOSPITAL_BASED_OUTPATIENT_CLINIC_OR_DEPARTMENT_OTHER): Payer: Self-pay

## 2013-07-24 ENCOUNTER — Encounter (HOSPITAL_BASED_OUTPATIENT_CLINIC_OR_DEPARTMENT_OTHER)
Admission: RE | Disposition: A | Discharge status: Home / Self Care | Payer: Self-pay | Source: Ambulatory Visit | Attending: General Surgery

## 2013-07-24 DIAGNOSIS — D249 Benign neoplasm of unspecified breast: Secondary | ICD-10-CM | POA: Insufficient documentation

## 2013-07-24 DIAGNOSIS — Z79899 Other long term (current) drug therapy: Secondary | ICD-10-CM | POA: Insufficient documentation

## 2013-07-24 DIAGNOSIS — I10 Essential (primary) hypertension: Secondary | ICD-10-CM | POA: Diagnosis not present

## 2013-07-24 DIAGNOSIS — Z4001 Encounter for prophylactic removal of breast: Secondary | ICD-10-CM | POA: Insufficient documentation

## 2013-07-24 DIAGNOSIS — Z9221 Personal history of antineoplastic chemotherapy: Secondary | ICD-10-CM | POA: Insufficient documentation

## 2013-07-24 DIAGNOSIS — Z803 Family history of malignant neoplasm of breast: Secondary | ICD-10-CM | POA: Insufficient documentation

## 2013-07-24 DIAGNOSIS — C50919 Malignant neoplasm of unspecified site of unspecified female breast: Secondary | ICD-10-CM | POA: Diagnosis present

## 2013-07-24 DIAGNOSIS — Z9010 Acquired absence of unspecified breast and nipple: Secondary | ICD-10-CM | POA: Insufficient documentation

## 2013-07-24 DIAGNOSIS — Z853 Personal history of malignant neoplasm of breast: Secondary | ICD-10-CM | POA: Insufficient documentation

## 2013-07-24 DIAGNOSIS — D4860 Neoplasm of uncertain behavior of unspecified breast: Secondary | ICD-10-CM

## 2013-07-24 HISTORY — DX: Nausea with vomiting, unspecified: R11.2

## 2013-07-24 HISTORY — PX: BREAST RECONSTRUCTION WITH PLACEMENT OF TISSUE EXPANDER AND FLEX HD (ACELLULAR HYDRATED DERMIS): SHX6295

## 2013-07-24 HISTORY — PX: SIMPLE MASTECTOMY WITH AXILLARY SENTINEL NODE BIOPSY: SHX6098

## 2013-07-24 HISTORY — DX: Presence of spectacles and contact lenses: Z97.3

## 2013-07-24 HISTORY — DX: Other specified postprocedural states: Z98.890

## 2013-07-24 SURGERY — SIMPLE MASTECTOMY
Anesthesia: General | Site: Breast | Laterality: Left

## 2013-07-24 MED ORDER — SCOPOLAMINE 1 MG/3DAYS TD PT72
MEDICATED_PATCH | TRANSDERMAL | Status: AC
  Filled 2013-07-24: qty 1

## 2013-07-24 MED ORDER — OXYCODONE HCL 5 MG PO TABS: 5.0000 mg | ORAL_TABLET | Freq: Once | ORAL | Status: AC | PRN

## 2013-07-24 MED ORDER — POLYETHYLENE GLYCOL 3350 17 G PO PACK: 17.0000 g | PACK | Freq: Two times a day (BID) | ORAL | Status: DC

## 2013-07-24 MED ORDER — HYDRALAZINE HCL 20 MG/ML IJ SOLN
INTRAMUSCULAR | Status: AC
  Filled 2013-07-24: qty 1

## 2013-07-24 MED ORDER — HYDROMORPHONE HCL PF 1 MG/ML IJ SOLN
0.2500 mg | INTRAMUSCULAR | Status: DC | PRN
  Administered 2013-07-24 (×2): 0.5 mg via INTRAVENOUS

## 2013-07-24 MED ORDER — PROPOFOL 10 MG/ML IV EMUL
INTRAVENOUS | Status: AC
  Filled 2013-07-24: qty 50

## 2013-07-24 MED ORDER — HYDROMORPHONE HCL PF 1 MG/ML IJ SOLN
INTRAMUSCULAR | Status: AC
  Filled 2013-07-24: qty 1

## 2013-07-24 MED ORDER — CEFAZOLIN SODIUM 1-5 GM-% IV SOLN
1.0000 g | Freq: Four times a day (QID) | INTRAVENOUS | Status: AC
  Administered 2013-07-24 – 2013-07-25 (×3): 1 g via INTRAVENOUS

## 2013-07-24 MED ORDER — MIDAZOLAM HCL 2 MG/2ML IJ SOLN: 1.0000 mg | INTRAMUSCULAR | Status: DC | PRN

## 2013-07-24 MED ORDER — PROCHLORPERAZINE 25 MG RE SUPP: 25.0000 mg | Freq: Two times a day (BID) | RECTAL | Status: DC | PRN

## 2013-07-24 MED ORDER — HYDRALAZINE HCL 20 MG/ML IJ SOLN
5.0000 mg | Freq: Once | INTRAMUSCULAR | Status: AC
  Administered 2013-07-24: 5 mg via INTRAVENOUS

## 2013-07-24 MED ORDER — CHLORHEXIDINE GLUCONATE 4 % EX LIQD: 1.0000 "application " | Freq: Once | CUTANEOUS | Status: DC

## 2013-07-24 MED ORDER — PROPOFOL 10 MG/ML IV BOLUS
INTRAVENOUS | Status: DC | PRN
  Administered 2013-07-24: 200 mg via INTRAVENOUS

## 2013-07-24 MED ORDER — SUFENTANIL CITRATE 50 MCG/ML IV SOLN
INTRAVENOUS | Status: AC
  Filled 2013-07-24: qty 1

## 2013-07-24 MED ORDER — CEFAZOLIN SODIUM 1-5 GM-% IV SOLN
INTRAVENOUS | Status: AC
  Filled 2013-07-24: qty 50

## 2013-07-24 MED ORDER — ONDANSETRON HCL 4 MG/2ML IJ SOLN: 4.0000 mg | Freq: Once | INTRAMUSCULAR | Status: AC | PRN

## 2013-07-24 MED ORDER — DEXAMETHASONE SODIUM PHOSPHATE 4 MG/ML IJ SOLN
INTRAMUSCULAR | Status: DC | PRN
  Administered 2013-07-24: 10 mg via INTRAVENOUS

## 2013-07-24 MED ORDER — HYDROCHLOROTHIAZIDE 25 MG PO TABS
25.0000 mg | ORAL_TABLET | Freq: Every day | ORAL | Status: DC
  Administered 2013-07-24: 25 mg via ORAL
  Filled 2013-07-24: qty 1

## 2013-07-24 MED ORDER — LIDOCAINE HCL (CARDIAC) 20 MG/ML IV SOLN
INTRAVENOUS | Status: DC | PRN
  Administered 2013-07-24: 75 mg via INTRAVENOUS

## 2013-07-24 MED ORDER — OXYCODONE HCL 5 MG/5ML PO SOLN: 5.0000 mg | Freq: Once | ORAL | Status: AC | PRN

## 2013-07-24 MED ORDER — ACETAMINOPHEN 500 MG PO TABS: 1000.0000 mg | ORAL_TABLET | Freq: Four times a day (QID) | ORAL | Status: DC | PRN

## 2013-07-24 MED ORDER — KCL IN DEXTROSE-NACL 20-5-0.45 MEQ/L-%-% IV SOLN
INTRAVENOUS | Status: DC
  Administered 2013-07-24 – 2013-07-25 (×2): via INTRAVENOUS
  Filled 2013-07-24 (×2): qty 1000

## 2013-07-24 MED ORDER — SODIUM CHLORIDE 0.9 % IV SOLN
INTRAVENOUS | Status: DC | PRN
  Administered 2013-07-24: 09:00:00

## 2013-07-24 MED ORDER — FENTANYL CITRATE 0.05 MG/ML IJ SOLN: 50.0000 ug | INTRAMUSCULAR | Status: DC | PRN

## 2013-07-24 MED ORDER — LACTATED RINGERS IV SOLN
INTRAVENOUS | Status: DC
  Administered 2013-07-24 (×3): via INTRAVENOUS

## 2013-07-24 MED ORDER — TRAMADOL HCL 50 MG PO TABS
50.0000 mg | ORAL_TABLET | Freq: Four times a day (QID) | ORAL | Status: DC | PRN
  Administered 2013-07-24: 50 mg via ORAL
  Filled 2013-07-24: qty 1

## 2013-07-24 MED ORDER — SUFENTANIL CITRATE 50 MCG/ML IV SOLN
INTRAVENOUS | Status: DC | PRN
  Administered 2013-07-24: 10 ug via INTRAVENOUS
  Administered 2013-07-24: 20 ug via INTRAVENOUS

## 2013-07-24 MED ORDER — MIDAZOLAM HCL 5 MG/5ML IJ SOLN
INTRAMUSCULAR | Status: DC | PRN
  Administered 2013-07-24: 2 mg via INTRAVENOUS

## 2013-07-24 MED ORDER — BISACODYL 10 MG RE SUPP: 10.0000 mg | Freq: Every day | RECTAL | Status: DC | PRN

## 2013-07-24 MED ORDER — CEFAZOLIN SODIUM-DEXTROSE 2-3 GM-% IV SOLR
INTRAVENOUS | Status: AC
  Filled 2013-07-24: qty 50

## 2013-07-24 MED ORDER — HYDROCODONE-ACETAMINOPHEN 5-325 MG PO TABS: 1.0000 | ORAL_TABLET | ORAL | Status: DC | PRN

## 2013-07-24 MED ORDER — FERROUS SULFATE 325 (65 FE) MG PO TABS: 325.0000 mg | ORAL_TABLET | Freq: Every day | ORAL | Status: DC

## 2013-07-24 MED ORDER — IBUPROFEN 400 MG PO TABS
400.0000 mg | ORAL_TABLET | Freq: Four times a day (QID) | ORAL | Status: DC | PRN
  Administered 2013-07-24: 400 mg via ORAL
  Filled 2013-07-24: qty 2

## 2013-07-24 MED ORDER — ONDANSETRON HCL 4 MG PO TABS: 4.0000 mg | ORAL_TABLET | Freq: Four times a day (QID) | ORAL | Status: DC | PRN

## 2013-07-24 MED ORDER — SUCCINYLCHOLINE CHLORIDE 20 MG/ML IJ SOLN
INTRAMUSCULAR | Status: AC
  Filled 2013-07-24: qty 1

## 2013-07-24 MED ORDER — HYDROCODONE-ACETAMINOPHEN 5-325 MG PO TABS
1.0000 | ORAL_TABLET | ORAL | Status: DC | PRN
Start: 2013-07-24 — End: 2013-07-25
  Administered 2013-07-24: 1 via ORAL
  Administered 2013-07-24: 2 via ORAL
  Administered 2013-07-24: 1 via ORAL
  Administered 2013-07-25 (×2): 2 via ORAL
  Filled 2013-07-24 (×5): qty 2

## 2013-07-24 MED ORDER — MIDAZOLAM HCL 2 MG/2ML IJ SOLN
INTRAMUSCULAR | Status: AC
  Filled 2013-07-24: qty 2

## 2013-07-24 MED ORDER — BUPIVACAINE-EPINEPHRINE (PF) 0.25% -1:200000 IJ SOLN
INTRAMUSCULAR | Status: AC
  Filled 2013-07-24: qty 30

## 2013-07-24 MED ORDER — ONDANSETRON HCL 4 MG/2ML IJ SOLN: 4.0000 mg | Freq: Four times a day (QID) | INTRAMUSCULAR | Status: DC | PRN

## 2013-07-24 MED ORDER — SUCCINYLCHOLINE CHLORIDE 20 MG/ML IJ SOLN
INTRAMUSCULAR | Status: DC | PRN
  Administered 2013-07-24: 100 mg via INTRAVENOUS

## 2013-07-24 MED ORDER — SCOPOLAMINE 1 MG/3DAYS TD PT72
1.0000 | MEDICATED_PATCH | TRANSDERMAL | Status: DC
  Administered 2013-07-24: 1 via TRANSDERMAL
  Administered 2013-07-24: 1.5 mg via TRANSDERMAL

## 2013-07-24 MED ORDER — VALACYCLOVIR HCL 500 MG PO TABS
500.0000 mg | ORAL_TABLET | Freq: Every day | ORAL | Status: DC
  Administered 2013-07-24: 500 mg via ORAL

## 2013-07-24 MED ORDER — CEFAZOLIN SODIUM-DEXTROSE 2-3 GM-% IV SOLR
2.0000 g | INTRAVENOUS | Status: AC
  Administered 2013-07-24: 2 g via INTRAVENOUS

## 2013-07-24 SURGICAL SUPPLY — 86 items
APPLIER CLIP 11 MED OPEN (CLIP) ×3
BAG DECANTER FOR FLEXI CONT (MISCELLANEOUS) ×6 IMPLANT
BINDER BREAST LRG (GAUZE/BANDAGES/DRESSINGS) IMPLANT
BINDER BREAST MEDIUM (GAUZE/BANDAGES/DRESSINGS) ×3 IMPLANT
BINDER BREAST XLRG (GAUZE/BANDAGES/DRESSINGS) IMPLANT
BINDER BREAST XXLRG (GAUZE/BANDAGES/DRESSINGS) IMPLANT
BLADE HEX COATED 2.75 (ELECTRODE) ×3 IMPLANT
BLADE SURG 10 STRL SS (BLADE) ×3 IMPLANT
BLADE SURG 15 STRL LF DISP TIS (BLADE) ×2 IMPLANT
BLADE SURG 15 STRL SS (BLADE) ×4
BNDG COHESIVE 4X5 TAN STRL (GAUZE/BANDAGES/DRESSINGS) IMPLANT
BNDG GAUZE ELAST 4 BULKY (GAUZE/BANDAGES/DRESSINGS) ×6 IMPLANT
CANISTER SUCT 1200ML W/VALVE (MISCELLANEOUS) ×6 IMPLANT
CHLORAPREP W/TINT 26ML (MISCELLANEOUS) ×3 IMPLANT
CLIP APPLIE 11 MED OPEN (CLIP) ×1 IMPLANT
CLIP TI LARGE 6 (CLIP) ×3 IMPLANT
CLIP TI MEDIUM 6 (CLIP) IMPLANT
CLIP TI WIDE RED SMALL 6 (CLIP) IMPLANT
COVER MAYO STAND STRL (DRAPES) ×3 IMPLANT
COVER TABLE BACK 60X90 (DRAPES) IMPLANT
DECANTER SPIKE VIAL GLASS SM (MISCELLANEOUS) IMPLANT
DERMABOND ADVANCED (GAUZE/BANDAGES/DRESSINGS) ×2
DERMABOND ADVANCED .7 DNX12 (GAUZE/BANDAGES/DRESSINGS) ×1 IMPLANT
DRAIN CHANNEL 15F RND FF W/TCR (WOUND CARE) IMPLANT
DRAIN CHANNEL 19F RND (DRAIN) ×3 IMPLANT
DRAPE LAPAROSCOPIC ABDOMINAL (DRAPES) IMPLANT
DRAPE UTILITY XL STRL (DRAPES) ×3 IMPLANT
DRSG PAD ABDOMINAL 8X10 ST (GAUZE/BANDAGES/DRESSINGS) ×6 IMPLANT
DRSG TEGADERM 4X4.75 (GAUZE/BANDAGES/DRESSINGS) ×6 IMPLANT
ELECT BLADE 4.0 EZ CLEAN MEGAD (MISCELLANEOUS) ×3
ELECT BLADE 6.5 .24CM SHAFT (ELECTRODE) ×3 IMPLANT
ELECT COATED BLADE 2.86 ST (ELECTRODE) ×3 IMPLANT
ELECT REM PT RETURN 9FT ADLT (ELECTROSURGICAL) ×3
ELECTRODE BLDE 4.0 EZ CLN MEGD (MISCELLANEOUS) ×1 IMPLANT
ELECTRODE REM PT RTRN 9FT ADLT (ELECTROSURGICAL) ×1 IMPLANT
EVACUATOR SILICONE 100CC (DRAIN) ×3 IMPLANT
EXPANDER BREAST 275CC (Breast) ×3 IMPLANT
GLOVE BIO SURGEON STRL SZ 6 (GLOVE) ×9 IMPLANT
GLOVE BIO SURGEON STRL SZ 6.5 (GLOVE) IMPLANT
GLOVE BIO SURGEONS STRL SZ 6.5 (GLOVE)
GLOVE BIOGEL M 7.0 STRL (GLOVE) ×3 IMPLANT
GLOVE BIOGEL PI IND STRL 6.5 (GLOVE) ×1 IMPLANT
GLOVE BIOGEL PI IND STRL 7.5 (GLOVE) ×1 IMPLANT
GLOVE BIOGEL PI INDICATOR 6.5 (GLOVE) ×2
GLOVE BIOGEL PI INDICATOR 7.5 (GLOVE) ×2
GOWN STRL REUS W/ TWL LRG LVL3 (GOWN DISPOSABLE) ×3 IMPLANT
GOWN STRL REUS W/TWL 2XL LVL3 (GOWN DISPOSABLE) ×3 IMPLANT
GOWN STRL REUS W/TWL LRG LVL3 (GOWN DISPOSABLE) ×6
GRAFT FLEX HD 4X16 THICK (Tissue Mesh) ×3 IMPLANT
IV NS 500ML (IV SOLUTION)
IV NS 500ML BAXH (IV SOLUTION) IMPLANT
KIT FILL SYSTEM UNIVERSAL (SET/KITS/TRAYS/PACK) ×3 IMPLANT
NEEDLE HYPO 25X1 1.5 SAFETY (NEEDLE) ×3 IMPLANT
NEEDLE SPNL 22GX3.5 QUINCKE BK (NEEDLE) IMPLANT
NS IRRIG 1000ML POUR BTL (IV SOLUTION) ×3 IMPLANT
PACK BASIN DAY SURGERY FS (CUSTOM PROCEDURE TRAY) ×3 IMPLANT
PACK UNIVERSAL I (CUSTOM PROCEDURE TRAY) ×3 IMPLANT
PENCIL BUTTON HOLSTER BLD 10FT (ELECTRODE) ×3 IMPLANT
PIN SAFETY STERILE (MISCELLANEOUS) ×3 IMPLANT
SET ASEPTIC TRANSFER (MISCELLANEOUS) ×6 IMPLANT
SLEEVE SCD COMPRESS KNEE MED (MISCELLANEOUS) ×3 IMPLANT
SPONGE LAP 18X18 X RAY DECT (DISPOSABLE) ×12 IMPLANT
STAPLER VISISTAT 35W (STAPLE) IMPLANT
STOCKINETTE IMPERVIOUS LG (DRAPES) IMPLANT
SUT CHROMIC 5 0 P 3 (SUTURE) ×3 IMPLANT
SUT ETHILON 2 0 FS 18 (SUTURE) ×3 IMPLANT
SUT MNCRL AB 4-0 PS2 18 (SUTURE) ×6 IMPLANT
SUT SILK 0 TIES 10X30 (SUTURE) IMPLANT
SUT SILK 2 0 SH (SUTURE) ×3 IMPLANT
SUT VIC AB 3-0 PS1 18 (SUTURE)
SUT VIC AB 3-0 PS1 18XBRD (SUTURE) IMPLANT
SUT VIC AB 3-0 SH 27 (SUTURE) ×8
SUT VIC AB 3-0 SH 27X BRD (SUTURE) ×4 IMPLANT
SUT VICRYL 3-0 CR8 SH (SUTURE) IMPLANT
SUT VICRYL 4-0 PS2 18IN ABS (SUTURE) IMPLANT
SUT VICRYL AB 2 0 TIE (SUTURE) IMPLANT
SUT VICRYL AB 2 0 TIES (SUTURE)
SYR BULB IRRIGATION 50ML (SYRINGE) ×6 IMPLANT
SYR CONTROL 10ML LL (SYRINGE) ×3 IMPLANT
SYRINGE 60CC LL (MISCELLANEOUS) ×3 IMPLANT
TOWEL OR 17X24 6PK STRL BLUE (TOWEL DISPOSABLE) ×3 IMPLANT
TOWEL OR NON WOVEN STRL DISP B (DISPOSABLE) ×3 IMPLANT
TUBE CONNECTING 20'X1/4 (TUBING) ×2
TUBE CONNECTING 20X1/4 (TUBING) ×4 IMPLANT
UNDERPAD 30X30 INCONTINENT (UNDERPADS AND DIAPERS) ×3 IMPLANT
YANKAUER SUCT BULB TIP NO VENT (SUCTIONS) ×6 IMPLANT

## 2013-07-24 NOTE — Discharge Instructions (Addendum)
[facility] [place] °[phone] ° °MASTECTOMY: POST OP INSTRUCTIONS ° °Always review your discharge instruction sheet given to you by the facility where your surgery was performed. °IF YOU HAVE DISABILITY OR FAMILY LEAVE FORMS, YOU MUST BRING THEM TO THE OFFICE FOR PROCESSING.   °DO NOT GIVE THEM TO YOUR DOCTOR. °A prescription for pain medication may be given to you upon discharge.  Take your pain medication as prescribed, if needed.  If narcotic pain medicine is not needed, then you may take acetaminophen (Tylenol) or ibuprofen (Advil) as needed. °1. Take your usually prescribed medications unless otherwise directed. °2. If you need a refill on your pain medication, please contact your pharmacy.  They will contact our office to request authorization.  Prescriptions will not be filled after 5pm or on week-ends. °3. You should follow a light diet the first few days after arrival home, such as soup and crackers, etc.  Resume your normal diet the day after surgery. °4. Most patients will experience some swelling and bruising on the chest and underarm.  Ice packs will help.  Swelling and bruising can take several days to resolve.  °5. It is common to experience some constipation if taking pain medication after surgery.  Increasing fluid intake and taking a stool softener (such as Colace) will usually help or prevent this problem from occurring.  A mild laxative (Milk of Magnesia or Miralax) should be taken according to package instructions if there are no bowel movements after 48 hours. °6. Unless discharge instructions indicate otherwise, leave your bandage dry and in place until your next appointment in 3-5 days.  You may take a limited sponge bath.  No tube baths or showers until the drains are removed.  You may have steri-strips (small skin tapes) in place directly over the incision.  These strips should be left on the skin for 7-10 days.  If your surgeon used skin glue on the incision, you may  shower in 24 hours.  The glue will flake off over the next 2-3 weeks.  Any sutures or staples will be removed at the office during your follow-up visit. °7. DRAINS:  If you have drains in place, it is important to keep a list of the amount of drainage produced each day in your drains.  Before leaving the hospital, you should be instructed on drain care.  Call our office if you have any questions about your drains. °8. ACTIVITIES:  You may resume regular (light) daily activities beginning the next day--such as daily self-care, walking, climbing stairs--gradually increasing activities as tolerated.  You may have sexual intercourse when it is comfortable.  Refrain from any heavy lifting or straining until approved by your doctor. °a. You may drive when you are no longer taking prescription pain medication, you can comfortably wear a seatbelt, and you can safely maneuver your car and apply brakes. °b. RETURN TO WORK:  __________________________________________________________ °9. You should see your doctor in the office for a follow-up appointment approximately 3-5 days after your surgery.  Your doctor’s nurse will typically make your follow-up appointment when she calls you with your pathology report.  Expect your pathology report 2-3 business days after your surgery.  You may call to check if you do not hear from us after three days.   °10. OTHER INSTRUCTIONS: ______________________________________________________________________________________________ ____________________________________________________________________________________________ ° °WHEN TO CALL YOUR DOCTOR: °1. Fever over 101.0 °2. Nausea and/or vomiting °3. Extreme swelling or bruising °4. Continued bleeding from incision. °5. Increased pain, redness, or drainage from the   incision. ° °The clinic staff is available to answer your questions during regular business hours.  Please don’t hesitate to call and ask to speak to one of the nurses for clinical  concerns.  If you have a medical emergency, go to the nearest emergency room or call 911.  A surgeon from [facility] is always on call at the hospital. °[address], [address], [place], [place]  [address] ? [address] [address], [place], [place]   [address] °[phone] ? [phone] ? FAX [phone] °Web site: www.[facility] ° °About my Jackson-Pratt Bulb Drain ° °What is a Jackson-Pratt bulb? °A Jackson-Pratt is a soft, round device used to collect drainage. It is connected to a long, thin drainage catheter, which is held in place by one or two small stiches near your surgical incision site. When the bulb is squeezed, it forms a vacuum, forcing the drainage to empty into the bulb. ° °Emptying the Jackson-Pratt bulb- °To empty the bulb: °1. Release the plug on the top of the bulb. °2. Pour the bulb's contents into a measuring container which your nurse will provide. °3. Record the time emptied and amount of drainage. Empty the drain(s) as often as your     doctor or nurse recommends. ° °Date                  Time                    Amount (Drain 1)                 Amount (Drain 2) ° °_____________________________________________________________________ ° °_____________________________________________________________________ ° °_____________________________________________________________________ ° °_____________________________________________________________________ ° °_____________________________________________________________________ ° °_____________________________________________________________________ ° °_____________________________________________________________________ ° °_____________________________________________________________________ ° °Squeezing the Jackson-Pratt Bulb- °To squeeze the bulb: °1. Make sure the plug at the top of the bulb is open. °2. Squeeze the bulb tightly in your fist. You will hear air squeezing from the bulb. °3. Replace the plug while the bulb is squeezed. °4.  Use a safety pin to attach the bulb to your clothing. This will keep the catheter from     pulling at the bulb insertion site. ° °When to call your doctor- °Call your doctor if: °· Drain site becomes red, swollen or hot. °· You have a fever greater than 101 degrees F. °· There is oozing at the drain site. °· Drain falls out (apply a guaze bandage over the drain hole and secure it with tape). °· Drainage increases daily not related to activity patterns. (You will usually have more drainage when you are active than when you are resting.) °· Drainage has a bad odor. ° °

## 2013-07-24 NOTE — Op Note (Signed)
Left Skin sparing and areola sparing mastectomy  Indications: This patient presents with history of right breast cancer, dense breasts, and strong family history of breast cancer  Pre-operative Diagnosis: see above  Post-operative Diagnosis: same  Surgeon: Stark Klein   Assistant:  Irene Limbo  Anesthesia: General endotracheal anesthesia and Local anesthesia 0.25.% bupivacaine, with epinephrine  ASA Class: 2  Procedure Details  The patient was seen in the Holding Room. The risks, benefits, complications, treatment options, and expected outcomes were discussed with the patient. The possibilities of reaction to medication, pulmonary aspiration, bleeding, infection, the need for additional procedures, failure to diagnose a condition, and creating a complication requiring transfusion or operation were discussed with the patient. The patient concurred with the proposed plan, giving informed consent.  The site of surgery properly noted/marked. The patient was taken to Operating Room # 5, identified as Jaime Murillo and the procedure verified as Left Mastectomy (with reconstruction by Dr. Iran Planas). A Time Out was held and the above information confirmed.     After induction of anesthesia, the bilateral chest/breasts were prepped and draped in standard fashion.   The borders of the breast were identified and marked.  The inframammary incision was marked out.  The  incision was made with the #15 blade.  Mastectomy hooks were used to provide elevation of the skin edges, and the Bovie was used to create the mastectomy flaps.  The dissection was taken to the fascia of the pectoralis major.  The penetrating vessels were clipped.  The superior flap was taken medially to the lateral sternal border, superiorly to the inferior border of the clavicle.  The incision was at the inframammary fold, and laterally to the border of the latissimus.  The breast was taken off including the pectoralis fascia and the  axillary tail marked.      The breast cavity was irrigated.  Hemostasis was achieved with cautery.      Sterile dressings were applied. At the end of the operation, all sponge, instrument, and needle counts were correct.  Findings: grossly clear surgical margins  Estimated Blood Loss:  less than 50 mL         Drains: per Dr. Iran Planas                Specimens: L breast          Complications:  None; patient tolerated the procedure well.         Disposition: In OR with Dr. Iran Planas.         Condition: stable

## 2013-07-24 NOTE — Anesthesia Postprocedure Evaluation (Signed)
  Anesthesia Post-op Note  Patient: Lawyer  Procedure(s) Performed: Procedure(s): SIMPLE MASTECTOMY (Left) BREAST RECONSTRUCTION WITH PLACEMENT OF TISSUE EXPANDER AND FLEX HD (ACELLULAR HYDRATED DERMIS) TO THE LEFT BREAST (Left)  Patient Location: PACU  Anesthesia Type:General  Level of Consciousness: awake and alert   Airway and Oxygen Therapy: Patient Spontanous Breathing  Post-op Pain: mild  Post-op Assessment: Post-op Vital signs reviewed, Patient's Cardiovascular Status Stable and Respiratory Function Stable  Post-op Vital Signs: Reviewed  Filed Vitals:   07/24/13 1145  BP: 161/101  Pulse: 62  Temp:   Resp: 15    Complications: No apparent anesthesia complications

## 2013-07-24 NOTE — Anesthesia Preprocedure Evaluation (Signed)
Anesthesia Evaluation  Patient identified by MRN, date of birth, ID band Patient awake    Reviewed: Allergy & Precautions, H&P , NPO status , Patient's Chart, lab work & pertinent test results  History of Anesthesia Complications (+) PONV  Airway Mallampati: I TM Distance: >3 FB Neck ROM: Full    Dental  (+) Teeth Intact, Dental Advisory Given, Missing   Pulmonary  breath sounds clear to auscultation        Cardiovascular hypertension, Pt. on medications Rhythm:Regular Rate:Normal     Neuro/Psych    GI/Hepatic   Endo/Other    Renal/GU      Musculoskeletal   Abdominal   Peds  Hematology   Anesthesia Other Findings   Reproductive/Obstetrics                           Anesthesia Physical Anesthesia Plan  ASA: II  Anesthesia Plan: General   Post-op Pain Management:    Induction: Intravenous  Airway Management Planned: Oral ETT  Additional Equipment:   Intra-op Plan:   Post-operative Plan: Extubation in OR  Informed Consent: I have reviewed the patients History and Physical, chart, labs and discussed the procedure including the risks, benefits and alternatives for the proposed anesthesia with the patient or authorized representative who has indicated his/her understanding and acceptance.   Dental advisory given  Plan Discussed with: CRNA, Anesthesiologist and Surgeon  Anesthesia Plan Comments:         Anesthesia Quick Evaluation

## 2013-07-24 NOTE — Anesthesia Procedure Notes (Signed)
Procedure Name: Intubation Date/Time: 07/24/2013 7:49 AM Performed by: Melynda Ripple D Pre-anesthesia Checklist: Patient identified, Emergency Drugs available, Suction available and Patient being monitored Patient Re-evaluated:Patient Re-evaluated prior to inductionOxygen Delivery Method: Circle System Utilized Preoxygenation: Pre-oxygenation with 100% oxygen Intubation Type: IV induction Ventilation: Mask ventilation without difficulty Laryngoscope Size: Mac and 3 Grade View: Grade I Tube type: Oral Number of attempts: 1 Airway Equipment and Method: stylet and oral airway Placement Confirmation: ETT inserted through vocal cords under direct vision,  positive ETCO2 and breath sounds checked- equal and bilateral Secured at: 22 cm Tube secured with: Tape Dental Injury: Teeth and Oropharynx as per pre-operative assessment

## 2013-07-24 NOTE — Interval H&P Note (Signed)
History and Physical Interval Note:  07/24/2013 6:59 AM  Jaime Murillo  has presented today for surgery, with the diagnosis of DENSE BREAST 1 ST DEGREE RELATIVES WITH BREAST CANCER   The various methods of treatment have been discussed with the patient and family. After consideration of risks, benefits and other options for treatment, the patient has consented to  Procedure(s): SIMPLE MASTECTOMY (Left) BREAST RECONSTRUCTION WITH PLACEMENT OF TISSUE EXPANDER AND FLEX HD (ACELLULAR HYDRATED DERMIS) TO THE LEFT BREAST (Left) as a surgical intervention .  The patient's history has been reviewed, patient examined, no change in status, stable for surgery.  I have reviewed the patient's chart and labs.  Questions were answered to the patient's satisfaction.     THIMMAPPA, BRINDA

## 2013-07-24 NOTE — Transfer of Care (Signed)
Immediate Anesthesia Transfer of Care Note  Patient: Jaime Murillo  Procedure(s) Performed: Procedure(s): SIMPLE MASTECTOMY (Left) BREAST RECONSTRUCTION WITH PLACEMENT OF TISSUE EXPANDER AND FLEX HD (ACELLULAR HYDRATED DERMIS) TO THE LEFT BREAST (Left)  Patient Location: PACU  Anesthesia Type:General  Level of Consciousness: sedated  Airway & Oxygen Therapy: Patient Spontanous Breathing and Patient connected to face mask oxygen  Post-op Assessment: Report given to PACU RN and Post -op Vital signs reviewed and stable  Post vital signs: Reviewed and stable  Complications: No apparent anesthesia complications

## 2013-07-24 NOTE — Op Note (Signed)
Operative Note   DATE OF OPERATION: 6.30.2015  LOCATION: [facility]- observation  SURGICAL DIVISION: Plastic Surgery  PREOPERATIVE DIAGNOSES:  1. History right breast cancer 2. Acquired absence right breast 3. Family history breast cancer 4. Dense breasts  POSTOPERATIVE DIAGNOSES:  same  PROCEDURE:  1. Breast reconstruction with placement of tissue expander 2. Acellular dermis for use in breast reconstruction 100 cm2  SURGEON: Irene Limbo MD MBA  ASSISTANT: none  ANESTHESIA:  General.   EBL: 425 ml  COMPLICATIONS: None.   INDICATIONS FOR PROCEDURE:  The patient, Jaime Murillo, is a 53 y.o. female born on 06-06-1960, is here for prophylactic mastectomy on left with immediate reconstruction.   FINDINGS: [place] Style 9200 275 ml TE, initial fill volume 140 ml. Ref [id number] SN [id number]  DESCRIPTION OF PROCEDURE:  The patient was marked in the preoperative area including breast meridians, sternal notch, chest midline, anterior axillary lines and inframammary folds. The patient was taken to the operating room. SCDs were placed and IV antibiotics were given. The patient's operative site was prepped and draped in a sterile fashion. A time out was performed and all information was confirmed to be correct. Following completion of mastectomy, the cavity was irrigated with saline and hemostasis obtained. The inferior insertions of pectoralis major muscle were divided and submuscular dissection completed. Acelluar dermis was perforated and sewn to inferior border of pectoralis major with running 3-0 vicryl. A 19 Fr drain was placed in subcutaneous position and secured to skin with 2-0 nylon. The cavity was irrigated with solution containing Ancef, genatmicin, and bacitracin. The tissue expander was prepared and placed in submuscular position. The expander was secured to chest wall with a 3-0 vicryl. The inferior border of the acellular dermis was inset to inframammary foldand  laterally border was left free. The incision was closed with 3-0 vicryl in fascial layer and 4-0 vicryl in dermis. Skin closure completed with 4-0 monocryl subcuticular and Dermabond. The areola was closed with 3-0 vicyl indermis and running simple skin stitch of 4-0 monocryl. Demabond applied here as well. The port was accessed and filled to 140 ml. The patient was brought to sitting position and skin envelope redraped so that areola was located similar distance from chest midline and sternal notch as compared with opposite breast. Tegaderms placed and breast binder applied.  The patient was allowed to wake from anesthesia, extubated and taken to the recovery room in satisfactory condition.   SPECIMENS: none  DRAINS: 58 Fr in left reconstructed breast  Irene Limbo, MD [facility] [phone]

## 2013-07-24 NOTE — H&P (Signed)
Jaime Murillo is an 53 y.o. female.   Chief Complaint: right breast cancer, dense breasts, family history of breast cancer HPI:  Pt is a 53 yo F with dense breasts, s/p right mastectomy for breast cancer, desires symmetry and prophylaxis given high risk situation.  She is doing well.    Past Medical History  Diagnosis Date  . HSV infection     takes Valtrex daily  . Urinary frequency   . Breast cancer 2014    triple negative  . Nausea     takes Compazine and Zofran as needed  . Anemia     takes Ferrous Sulfate daily  . Depression     takes Cymbalta daily  . Insomnia     takes Ambien nightly as needed  . Hypertension     takes HCTZ daily  . Dizziness     r/t meds  . Neuropathy     takes Lyrica daily and alternates with Gabapentin  . Joint pain   . Nocturia   . History of blood transfusion     no abnormal reaction noted  . Anxiety   . Vision blurred 02/2013  . Neuropathy of both feet     DUE TO CHEMO PER PATIENT  . Wears glasses   . PONV (postoperative nausea and vomiting)     had n/v after mastectomy    Past Surgical History  Procedure Laterality Date  . Tubal ligation  2003    LTL  . Portacath placement N/A 07/06/2012    Procedure: INSERTION PORT-A-CATH;  Surgeon: Stark Klein, MD;  Location: [place];  Service: General;  Laterality: N/A;  . Port-a-cath removal Left 01/12/2013    Procedure: REMOVAL PORT-A-CATH;  Surgeon: Stark Klein, MD;  Location: [facility];  Service: General;  Laterality: Left;  . Breast lumpectomy Right 01/12/2013    Procedure: RIGHT SENTINEL LYMPH  NODE BIOPSY;  Surgeon: Stark Klein, MD;  Location: [facility];  Service: General;  Laterality: Right;  RIGHT SENTINEL LYMPH NODE BIOPSY  . Colonoscopy    . Mastectomy  03/20/2013    WITH RECONSRTUCTION          DR BYERLY   . Total mastectomy Right 03/20/2013    Procedure: RIGHT SKIN SPARING TOTAL MASTECTOMY;  Surgeon: Stark Klein, MD;  Location: [facility];  Service: General;   Laterality: Right;  . Breast reconstruction with placement of tissue expander and flex hd (acellular hydrated dermis) Right 03/20/2013    Procedure: IMMEDIATE RIGHT BREAST RECONSTRUCTION WITH PLACEMENT OF TISSUE EXPANDER AND FLEX HD (ACELLULAR HYDRATED DERMIS);  Surgeon: Irene Limbo, MD;  Location: [facility];  Service: Plastics;  Laterality: Right;    Family History  Problem Relation Age of Onset  . Heart disease Mother   . Aneurysm Mother   . Hypertension Father   . Prostate cancer Father 61  . Hypertension Sister   . Ovarian cancer Paternal Grandmother     died in her 45s  . Stomach cancer Maternal Grandmother 39  . Cancer Cousin     paternal cousin with an unknown form of cancer   Social History:  reports that she has never smoked. She has never used smokeless tobacco. She reports that she does not drink alcohol or use illicit drugs.  Allergies: No Known Allergies  Medications Prior to Admission  Medication Sig Dispense Refill  . acetaminophen (TYLENOL) 500 MG tablet Take 1,000 mg by mouth every 6 (six) hours as needed for mild pain.      Marland Kitchen  B Complex-C (SUPER B COMPLEX PO) Take 1 tablet by mouth daily.       . bisacodyl (DULCOLAX) 10 MG suppository Place 1 suppository (10 mg total) rectally daily as needed (no bowel movement).  12 suppository  0  . ferrous sulfate 325 (65 FE) MG tablet Take 325 mg by mouth daily with breakfast.      . hydrochlorothiazide (HYDRODIURIL) 25 MG tablet Take 1 tablet (25 mg total) by mouth daily.  30 tablet  11  . ibuprofen (ADVIL,MOTRIN) 200 MG tablet Take 400 mg by mouth every 6 (six) hours as needed for moderate pain.      . polyethylene glycol (MIRALAX / GLYCOLAX) packet Take 17 g by mouth 2 (two) times daily.  14 each  0  . traMADol (ULTRAM) 50 MG tablet Take 50 mg by mouth every 6 (six) hours as needed.      . valACYclovir (VALTREX) 500 MG tablet Take 500 mg by mouth daily.      . prochlorperazine (COMPAZINE) 25 MG suppository Place 25 mg  rectally every 12 (twelve) hours as needed for nausea (as needed for nausea and vomitting).        No results found for this or any previous visit (from the past 48 hour(s)). No results found.  Review of Systems  All other systems reviewed and are negative.   Blood pressure 128/91, pulse 64, temperature 97.4 F (36.3 C), temperature source Oral, resp. rate 20, height 5\' 5"  (1.651 m), weight 121 lb (54.885 kg), SpO2 100.00%. Physical Exam  Constitutional: She is oriented to person, place, and time. She appears well-developed and well-nourished. No distress.  HENT:  Head: Normocephalic and atraumatic.  Eyes: Conjunctivae are normal. Pupils are equal, round, and reactive to light. No scleral icterus.  Neck: Normal range of motion.  Cardiovascular: Normal rate.   Respiratory: Effort normal. No respiratory distress.  GI: Soft.  Musculoskeletal: Normal range of motion.  Neurological: She is alert and oriented to person, place, and time.  Skin: Skin is warm and dry. She is not diaphoretic.  Psychiatric: She has a normal mood and affect. Her behavior is normal. Judgment and thought content normal.   Breast:  Dense left breast, right breast surgically reconstructed.    Assessment/Plan High risk for breast cancer, personal history of right breast cancer Plan left mastectomy with reconstruction.  Reviewed risks and benefits.   BYERLY,FAERA 07/24/2013, 7:27 AM

## 2013-07-25 ENCOUNTER — Encounter (HOSPITAL_BASED_OUTPATIENT_CLINIC_OR_DEPARTMENT_OTHER): Payer: Self-pay | Admitting: General Surgery

## 2013-07-25 DIAGNOSIS — Z4001 Encounter for prophylactic removal of breast: Secondary | ICD-10-CM | POA: Diagnosis not present

## 2013-07-25 MED ORDER — SULFAMETHOXAZOLE-TMP DS 800-160 MG PO TABS: 1.0000 | ORAL_TABLET | Freq: Two times a day (BID) | ORAL | Status: DC

## 2013-07-26 ENCOUNTER — Ambulatory Visit: Payer: Medicaid Other | Admitting: Oncology

## 2013-07-26 ENCOUNTER — Other Ambulatory Visit: Payer: Medicaid Other

## 2013-08-07 ENCOUNTER — Telehealth: Payer: Self-pay | Admitting: Oncology

## 2013-08-07 NOTE — Telephone Encounter (Signed)
Faxed pt medical records to Dr. Harvie Heck @ [facility] and scans will be fedex'ed.

## 2013-08-14 ENCOUNTER — Ambulatory Visit (INDEPENDENT_AMBULATORY_CARE_PROVIDER_SITE_OTHER): Payer: Medicaid Other | Admitting: General Surgery

## 2013-08-14 ENCOUNTER — Encounter (INDEPENDENT_AMBULATORY_CARE_PROVIDER_SITE_OTHER): Payer: Self-pay | Admitting: General Surgery

## 2013-08-14 VITALS — BP 130/88 | HR 80 | Resp 14 | Ht 64.0 in | Wt 121.2 lb

## 2013-08-14 DIAGNOSIS — C50319 Malignant neoplasm of lower-inner quadrant of unspecified female breast: Secondary | ICD-10-CM

## 2013-08-14 DIAGNOSIS — C50311 Malignant neoplasm of lower-inner quadrant of right female breast: Secondary | ICD-10-CM

## 2013-08-14 NOTE — Patient Instructions (Signed)
Follow up with me in 6 months.  Have fun with school.

## 2013-08-16 ENCOUNTER — Encounter (HOSPITAL_COMMUNITY): Payer: Self-pay

## 2013-08-16 NOTE — Assessment & Plan Note (Addendum)
No evidence of surgical complications.  Drains out. Healing well.  Follow up in 6 months.

## 2013-08-16 NOTE — Progress Notes (Signed)
HISTORY: Pt is s/p left areola sparing mastectomy with expander reconstruction in conjunction with Dr. Iran Planas 07/24/2013.  She is doing well.  Her drains are out.  She is occasionally still taking a pain pill.  She is not having fever/chills/drainage from wound.  She is getting around a bit better.      EXAM: General:  Alert and oriented.   Incision:  No evidence of infection.  Nipples sl downward, but symmetric.     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   ASSESSMENT AND PLAN:   Cancer of lower-inner quadrant of RIGHT female breast No evidence of surgical complications.  Drains out. Healing well.  Follow up in 6 months.        Milus Height, MD Surgical Oncology, [facility]  Elyn Peers, MD Lucianne Lei, MD

## 2013-10-11 ENCOUNTER — Other Ambulatory Visit: Payer: Medicaid Other

## 2013-10-11 ENCOUNTER — Ambulatory Visit: Payer: Medicaid Other | Admitting: Hematology and Oncology

## 2013-11-22 ENCOUNTER — Telehealth: Payer: Self-pay | Admitting: *Deleted

## 2013-11-22 NOTE — Telephone Encounter (Signed)
Spoke to patient to reschedule appt due to cm admin time. Patient will call the office back to schedule due to work.

## 2013-11-26 ENCOUNTER — Encounter (INDEPENDENT_AMBULATORY_CARE_PROVIDER_SITE_OTHER): Payer: Self-pay | Admitting: General Surgery

## 2013-11-29 ENCOUNTER — Ambulatory Visit: Payer: Medicaid Other | Admitting: Nurse Practitioner

## 2013-12-26 ENCOUNTER — Other Ambulatory Visit: Payer: Self-pay | Admitting: Adult Health

## 2013-12-27 ENCOUNTER — Telehealth: Payer: Self-pay | Admitting: *Deleted

## 2013-12-27 DIAGNOSIS — I10 Essential (primary) hypertension: Secondary | ICD-10-CM

## 2013-12-27 MED ORDER — HYDROCHLOROTHIAZIDE 25 MG PO TABS: 25.0000 mg | ORAL_TABLET | Freq: Every day | ORAL | Status: DC

## 2013-12-27 MED ORDER — VALACYCLOVIR HCL 500 MG PO TABS: 500.0000 mg | ORAL_TABLET | Freq: Every day | ORAL | Status: DC

## 2013-12-27 NOTE — Telephone Encounter (Signed)
Left on voicemail rx sent and no ultrasound in 6 months but yearly.

## 2013-12-27 NOTE — Telephone Encounter (Signed)
Pt called follow up from 07/04/13 OV asked if you want her to have ultrasound this month? Pt thought you told her every 6 months ultrasound due to history of cancer. She also is out of her blood pressure medication HCTZ 25 MG and valtrex 500 mg  requesting refill, no longer sees oncologist at [facility] center, will be seeing MD at [facility] next week. Please advise

## 2013-12-27 NOTE — Telephone Encounter (Signed)
Please inform patient that I reviewed my last note. She needs an ultrasound once a year not every 6 months. Please cure her refill for her HCTZ as well as her Valtrex to last her to lie see her for her annual exam next June or July

## 2013-12-28 NOTE — Pre-Procedure Instructions (Signed)
Jaime Murillo  12/28/2013   Your procedure is scheduled on:  Monday, December 14th  Report to [facility] Admitting at 945 AM.  Call this number if you have problems the morning of surgery: [phone]   Remember:   Do not eat food or drink liquids after midnight.   Take these medicines the morning of surgery with A SIP OF WATER: pain medication if needed   Do not wear jewelry, make-up or nail polish.  Do not wear lotions, powders, or perfume,deodorant.  Do not shave 48 hours prior to surgery. Men may shave face and neck.  Do not bring valuables to the hospital.  [facility] is not responsible for any belongings or valuables.               Contacts, dentures or bridgework may not be worn into surgery.  Leave suitcase in the car. After surgery it may be brought to your room.  For patients admitted to the hospital, discharge time is determined by your treatment team.              Please read over the following fact sheets that you were given: Pain Booklet, Coughing and Deep Breathing and Surgical Site Infection Prevention  [place] - Preparing for Surgery  Before surgery, you can play an important role.  Because skin is not sterile, your skin needs to be as free of germs as possible.  You can reduce the number of germs on you skin by washing with CHG (chlorahexidine gluconate) soap before surgery.  CHG is an antiseptic cleaner which kills germs and bonds with the skin to continue killing germs even after washing.  Please DO NOT use if you have an allergy to CHG or antibacterial soaps.  If your skin becomes reddened/irritated stop using the CHG and inform your nurse when you arrive at Short Stay.  Do not shave (including legs and underarms) for at least 48 hours prior to the first CHG shower.  You may shave your face.  Please follow these instructions carefully:   1.  Shower with CHG Soap the night before surgery and the morning of Surgery.  2.  If you choose to wash  your hair, wash your hair first as usual with your normal shampoo.  3.  After you shampoo, rinse your hair and body thoroughly to remove the shampoo.  4.  Use CHG as you would any other liquid soap.  You can apply CHG directly to the skin and wash gently with scrungie or a clean washcloth.  5.  Apply the CHG Soap to your body ONLY FROM THE NECK DOWN.  Do not use on open wounds or open sores.  Avoid contact with your eyes, ears, mouth and genitals (private parts).  Wash genitals (private parts) with your normal soap.  6.  Wash thoroughly, paying special attention to the area where your surgery will be performed.  7.  Thoroughly rinse your body with warm water from the neck down.  8.  DO NOT shower/wash with your normal soap after using and rinsing off the CHG Soap.  9.  Pat yourself dry with a clean towel.            10.  Wear clean pajamas.            11.  Place clean sheets on your bed the night of your first shower and do not sleep with pets.  Day of Surgery  Do not apply any lotions/deoderants the  morning of surgery.  Please wear clean clothes to the hospital/surgery center.

## 2013-12-31 ENCOUNTER — Encounter (HOSPITAL_COMMUNITY)
Admission: RE | Admit: 2013-12-31 | Discharge: 2013-12-31 | Disposition: A | Discharge status: Home / Self Care | Payer: Medicaid Other | Source: Ambulatory Visit | Attending: Plastic Surgery | Admitting: Plastic Surgery

## 2013-12-31 ENCOUNTER — Encounter (HOSPITAL_COMMUNITY): Payer: Self-pay

## 2013-12-31 DIAGNOSIS — Z01812 Encounter for preprocedural laboratory examination: Secondary | ICD-10-CM | POA: Diagnosis not present

## 2013-12-31 LAB — CBC
HEMATOCRIT: 35.7 % — AB (ref 36.0–46.0)
HEMOGLOBIN: 12.1 g/dL (ref 12.0–15.0)
MCH: 30.6 pg (ref 26.0–34.0)
MCHC: 33.9 g/dL (ref 30.0–36.0)
MCV: 90.2 fL (ref 78.0–100.0)
Platelets: 222 10*3/uL (ref 150–400)
RBC: 3.96 MIL/uL (ref 3.87–5.11)
RDW: 12.8 % (ref 11.5–15.5)
WBC: 2.9 10*3/uL — AB (ref 4.0–10.5)

## 2013-12-31 LAB — BASIC METABOLIC PANEL
Anion gap: 12 (ref 5–15)
BUN: 16 mg/dL (ref 6–23)
CO2: 27 meq/L (ref 19–32)
Calcium: 9.6 mg/dL (ref 8.4–10.5)
Chloride: 100 mEq/L (ref 96–112)
Creatinine, Ser: 0.7 mg/dL (ref 0.50–1.10)
GFR calc non Af Amer: >90 mL/min (ref >90)
Glucose, Bld: 80 mg/dL (ref 70–99)
POTASSIUM: 3.9 meq/L (ref 3.7–5.3)
Sodium: 139 mEq/L (ref 137–147)

## 2013-12-31 LAB — HCG, SERUM, QUALITATIVE: Preg, Serum: NEGATIVE

## 2013-12-31 NOTE — Pre-Procedure Instructions (Signed)
Jaime Murillo  12/31/2013   Your procedure is scheduled on:  Monday, December 14th  Report to [facility] Admitting at 945 AM.  Call this number if you have problems the morning of surgery: [phone]   Remember:   Do not eat food or drink liquids after midnight.   Take these medicines the morning of surgery with A SIP OF WATER: pain medication if needed, Valtrex   Do not wear jewelry, make-up or nail polish.  Do not wear lotions, powders, or perfume,deodorant.  Do not shave 48 hours prior to surgery.   Do not bring valuables to the hospital.  [facility] is not responsible for any belongings or valuables.               Contacts, dentures or bridgework may not be worn into surgery.  Leave suitcase in the car. After surgery it may be brought to your room.  For patients admitted to the hospital, discharge time is determined by your treatment team.              Please read over the following fact sheets that you were given: Pain Booklet, Coughing and Deep Breathing and Surgical Site Infection Prevention  [place] - Preparing for Surgery  Before surgery, you can play an important role.  Because skin is not sterile, your skin needs to be as free of germs as possible.  You can reduce the number of germs on you skin by washing with CHG (chlorahexidine gluconate) soap before surgery.  CHG is an antiseptic cleaner which kills germs and bonds with the skin to continue killing germs even after washing.  Please DO NOT use if you have an allergy to CHG or antibacterial soaps.  If your skin becomes reddened/irritated stop using the CHG and inform your nurse when you arrive at Short Stay.  Do not shave (including legs and underarms) for at least 48 hours prior to the first CHG shower.  You may shave your face.  Please follow these instructions carefully:   1.  Shower with CHG Soap the night before surgery and the morning of Surgery.  2.  If you choose to wash your hair, wash your  hair first as usual with your normal shampoo.  3.  After you shampoo, rinse your hair and body thoroughly to remove the shampoo.  4.  Use CHG as you would any other liquid soap.  You can apply CHG directly to the skin and wash gently with scrungie or a clean washcloth.  5.  Apply the CHG Soap to your body ONLY FROM THE NECK DOWN.  Do not use on open wounds or open sores.  Avoid contact with your eyes, ears, mouth and genitals (private parts).  Wash genitals (private parts) with your normal soap.  6.  Wash thoroughly, paying special attention to the area where your surgery will be performed.  7.  Thoroughly rinse your body with warm water from the neck down.  8.  DO NOT shower/wash with your normal soap after using and rinsing off the CHG Soap.  9.  Pat yourself dry with a clean towel.            10.  Wear clean pajamas.            11.  Place clean sheets on your bed the night of your first shower and do not sleep with pets.  Day of Surgery  Do not apply any lotions/deoderants the morning of surgery.  Please wear clean clothes to the hospital/surgery center.

## 2013-12-31 NOTE — H&P (Signed)
    Subjective:   Patient ID: Jaime Murillo is a 53 y.o. female.  HPI  Post bilateral areola sparing mastectomy for right breast cancer, strong family history of breast cancer . Plan for implant exchange with possible lipofilling. 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 Responded to chemotherapy and resolved radiographically. SLN bx done prior to mastectomy and this was negative.  34 B size prior.    Objective:   Physical Exam CV: regular rate, normal heart sounds PULM: clear to ausculatation Breasts soft, BW R 11 L 12 cm SN to center areola R 21 L 20 cm Step off superiorly with minimal soft tissue superior poles  Assessment:   Hx R breast cancer , family history breast cancer, dense breasts S/p bilat areola sparing mastectomies  Plan:   Discussed removal expanders and placement silicone anatomic implants.Risks including but not limited to bleeding, seroma, rotation implant, need for additional surgery, damage to deeper structures, DVT/PE, Asymmetry, extrusion, capsular contracture, unacceptable cosmetic result reviewed. Discussed lipofilling to improve contour superior poles. Discussed post procedure drains, abdominal and breast binders, and limitations. Reviewed retention fat is variable and may desire additional treatments. She has very little redundant soft tissue for fat harvest and discussed that if she has good appearance on table with implants we may not proceed with lipofilling at this time. Counseled this can be done in future if desired. Will utilize IMF incision again.   Bilat breast Mentor style 9200 tissue expander, 234ml L breast fill volume 280 ml  R breast fill volume 280 Ml   Irene Limbo, MD [facility] Plastic & Reconstructive Surgery [phone]

## 2013-12-31 NOTE — Progress Notes (Signed)
Patient denied having any cardiac or pulmonary issues. PCP is Lucianne Lei. Oncologist and Neurologist encounters are in [place].

## 2014-01-02 ENCOUNTER — Encounter: Payer: Self-pay | Admitting: *Deleted

## 2014-01-02 NOTE — Progress Notes (Signed)
Per previous note from Charlie Pitter patient is being seen [facility] by Dr. Harvie Heck. Attempted to call patient to verify.  No answer.

## 2014-01-06 MED ORDER — CEFAZOLIN SODIUM-DEXTROSE 2-3 GM-% IV SOLR
2.0000 g | INTRAVENOUS | Status: AC
  Administered 2014-01-07: 2 g via INTRAVENOUS
  Filled 2014-01-06: qty 50

## 2014-01-07 ENCOUNTER — Encounter (HOSPITAL_COMMUNITY)
Admission: RE | Disposition: A | Discharge status: Home / Self Care | Payer: Self-pay | Source: Ambulatory Visit | Attending: Plastic Surgery

## 2014-01-07 ENCOUNTER — Ambulatory Visit (HOSPITAL_COMMUNITY): Payer: Medicaid Other | Admitting: Anesthesiology

## 2014-01-07 ENCOUNTER — Observation Stay (HOSPITAL_COMMUNITY)
Admission: RE | Admit: 2014-01-07 | Discharge: 2014-01-08 | Disposition: A | Discharge status: Home / Self Care | Payer: Medicaid Other | Source: Ambulatory Visit | Attending: Plastic Surgery | Admitting: Plastic Surgery

## 2014-01-07 ENCOUNTER — Encounter (HOSPITAL_COMMUNITY): Payer: Self-pay | Admitting: *Deleted

## 2014-01-07 DIAGNOSIS — Z9221 Personal history of antineoplastic chemotherapy: Secondary | ICD-10-CM | POA: Insufficient documentation

## 2014-01-07 DIAGNOSIS — Z803 Family history of malignant neoplasm of breast: Secondary | ICD-10-CM | POA: Diagnosis not present

## 2014-01-07 DIAGNOSIS — Z853 Personal history of malignant neoplasm of breast: Secondary | ICD-10-CM | POA: Diagnosis not present

## 2014-01-07 DIAGNOSIS — I10 Essential (primary) hypertension: Secondary | ICD-10-CM | POA: Diagnosis not present

## 2014-01-07 DIAGNOSIS — F419 Anxiety disorder, unspecified: Secondary | ICD-10-CM | POA: Insufficient documentation

## 2014-01-07 DIAGNOSIS — Z9013 Acquired absence of bilateral breasts and nipples: Secondary | ICD-10-CM | POA: Diagnosis not present

## 2014-01-07 DIAGNOSIS — Z9010 Acquired absence of unspecified breast and nipple: Secondary | ICD-10-CM

## 2014-01-07 DIAGNOSIS — Z421 Encounter for breast reconstruction following mastectomy: Principal | ICD-10-CM | POA: Insufficient documentation

## 2014-01-07 HISTORY — PX: BREAST RECONSTRUCTION WITH PLACEMENT OF TISSUE EXPANDER AND FLEX HD (ACELLULAR HYDRATED DERMIS): SHX6295

## 2014-01-07 SURGERY — BREAST RECONSTRUCTION WITH PLACEMENT OF TISSUE EXPANDER AND FLEX HD (ACELLULAR HYDRATED DERMIS)
Anesthesia: General | Site: Breast | Laterality: Bilateral

## 2014-01-07 MED ORDER — LIDOCAINE HCL (CARDIAC) 20 MG/ML IV SOLN
INTRAVENOUS | Status: DC | PRN
  Administered 2014-01-07: 50 mg via INTRAVENOUS

## 2014-01-07 MED ORDER — HYDROMORPHONE HCL 1 MG/ML IJ SOLN
INTRAMUSCULAR | Status: AC
  Filled 2014-01-07: qty 2

## 2014-01-07 MED ORDER — HYDROMORPHONE HCL 1 MG/ML IJ SOLN
0.2500 mg | INTRAMUSCULAR | Status: DC | PRN
  Administered 2014-01-07 (×4): 0.5 mg via INTRAVENOUS

## 2014-01-07 MED ORDER — KETOROLAC TROMETHAMINE 15 MG/ML IJ SOLN
15.0000 mg | Freq: Three times a day (TID) | INTRAMUSCULAR | Status: DC
  Administered 2014-01-07 – 2014-01-08 (×4): 15 mg via INTRAVENOUS
  Filled 2014-01-07 (×7): qty 1

## 2014-01-07 MED ORDER — ONDANSETRON HCL 4 MG/2ML IJ SOLN: 4.0000 mg | Freq: Once | INTRAMUSCULAR | Status: DC | PRN

## 2014-01-07 MED ORDER — NEOSTIGMINE METHYLSULFATE 10 MG/10ML IV SOLN
INTRAVENOUS | Status: DC | PRN
  Administered 2014-01-07: 3 mg via INTRAVENOUS

## 2014-01-07 MED ORDER — MIDAZOLAM HCL 5 MG/5ML IJ SOLN
INTRAMUSCULAR | Status: DC | PRN
  Administered 2014-01-07: 2 mg via INTRAVENOUS

## 2014-01-07 MED ORDER — HYDROCHLOROTHIAZIDE 25 MG PO TABS
25.0000 mg | ORAL_TABLET | Freq: Every day | ORAL | Status: DC
Start: 2014-01-07 — End: 2014-01-08
  Administered 2014-01-07 – 2014-01-08 (×2): 25 mg via ORAL
  Filled 2014-01-07 (×3): qty 1

## 2014-01-07 MED ORDER — VALACYCLOVIR HCL 500 MG PO TABS
500.0000 mg | ORAL_TABLET | Freq: Every day | ORAL | Status: DC
  Administered 2014-01-08: 500 mg via ORAL
  Filled 2014-01-07: qty 1

## 2014-01-07 MED ORDER — LACTATED RINGERS IV SOLN
INTRAVENOUS | Status: DC | PRN
  Administered 2014-01-07 (×2): via INTRAVENOUS

## 2014-01-07 MED ORDER — CHLORHEXIDINE GLUCONATE 4 % EX LIQD: 1.0000 "application " | Freq: Once | CUTANEOUS | Status: DC

## 2014-01-07 MED ORDER — LACTATED RINGERS IV SOLN
INTRAVENOUS | Status: DC
  Administered 2014-01-07: 11:00:00 via INTRAVENOUS

## 2014-01-07 MED ORDER — DEXAMETHASONE SODIUM PHOSPHATE 4 MG/ML IJ SOLN
INTRAMUSCULAR | Status: DC | PRN
  Administered 2014-01-07: 4 mg via INTRAVENOUS

## 2014-01-07 MED ORDER — FENTANYL CITRATE 0.05 MG/ML IJ SOLN
INTRAMUSCULAR | Status: AC
  Filled 2014-01-07: qty 5

## 2014-01-07 MED ORDER — LIDOCAINE HCL (CARDIAC) 20 MG/ML IV SOLN
INTRAVENOUS | Status: AC
  Filled 2014-01-07: qty 5

## 2014-01-07 MED ORDER — MIDAZOLAM HCL 2 MG/2ML IJ SOLN
INTRAMUSCULAR | Status: AC
  Filled 2014-01-07: qty 2

## 2014-01-07 MED ORDER — ONDANSETRON HCL 4 MG/2ML IJ SOLN
4.0000 mg | Freq: Four times a day (QID) | INTRAMUSCULAR | Status: DC | PRN
  Administered 2014-01-07: 4 mg via INTRAVENOUS
  Filled 2014-01-07: qty 2

## 2014-01-07 MED ORDER — OXYCODONE HCL 5 MG/5ML PO SOLN: 5.0000 mg | Freq: Once | ORAL | Status: DC | PRN

## 2014-01-07 MED ORDER — 0.9 % SODIUM CHLORIDE (POUR BTL) OPTIME
TOPICAL | Status: DC | PRN
  Administered 2014-01-07: 1000 mL
  Administered 2014-01-07: 2000 mL

## 2014-01-07 MED ORDER — HYDROCODONE-ACETAMINOPHEN 5-325 MG PO TABS
1.0000 | ORAL_TABLET | ORAL | Status: DC | PRN
  Administered 2014-01-08: 1 via ORAL
  Filled 2014-01-07: qty 2
  Filled 2014-01-07: qty 1

## 2014-01-07 MED ORDER — SULFAMETHOXAZOLE-TRIMETHOPRIM 800-160 MG PO TABS: 1.0000 | ORAL_TABLET | Freq: Two times a day (BID) | ORAL | Status: DC

## 2014-01-07 MED ORDER — ROCURONIUM BROMIDE 50 MG/5ML IV SOLN
INTRAVENOUS | Status: AC
  Filled 2014-01-07: qty 1

## 2014-01-07 MED ORDER — PROPOFOL 10 MG/ML IV BOLUS
INTRAVENOUS | Status: AC
  Filled 2014-01-07: qty 20

## 2014-01-07 MED ORDER — ONDANSETRON HCL 4 MG/2ML IJ SOLN
INTRAMUSCULAR | Status: AC
  Filled 2014-01-07: qty 2

## 2014-01-07 MED ORDER — GENTAMICIN SULFATE 40 MG/ML IJ SOLN
Freq: Once | INTRAMUSCULAR | Status: AC
  Administered 2014-01-07: 1000 mL
  Filled 2014-01-07: qty 1

## 2014-01-07 MED ORDER — DIAZEPAM 5 MG PO TABS: 5.0000 mg | ORAL_TABLET | Freq: Three times a day (TID) | ORAL | Status: DC | PRN

## 2014-01-07 MED ORDER — NEOSTIGMINE METHYLSULFATE 10 MG/10ML IV SOLN
INTRAVENOUS | Status: AC
Start: 2014-01-07 — End: 2014-01-07
  Filled 2014-01-07: qty 1

## 2014-01-07 MED ORDER — ONDANSETRON HCL 4 MG/2ML IJ SOLN
INTRAMUSCULAR | Status: DC | PRN
  Administered 2014-01-07: 4 mg via INTRAVENOUS

## 2014-01-07 MED ORDER — OXYCODONE HCL 5 MG PO TABS: 5.0000 mg | ORAL_TABLET | Freq: Once | ORAL | Status: DC | PRN

## 2014-01-07 MED ORDER — FENTANYL CITRATE 0.05 MG/ML IJ SOLN
INTRAMUSCULAR | Status: DC | PRN
  Administered 2014-01-07: 100 ug via INTRAVENOUS
  Administered 2014-01-07 (×3): 50 ug via INTRAVENOUS

## 2014-01-07 MED ORDER — MORPHINE SULFATE 2 MG/ML IJ SOLN
2.0000 mg | INTRAMUSCULAR | Status: DC | PRN
Start: 2014-01-07 — End: 2014-01-08
  Administered 2014-01-07: 2 mg via INTRAVENOUS
  Filled 2014-01-07: qty 1

## 2014-01-07 MED ORDER — CEFAZOLIN SODIUM 1-5 GM-% IV SOLN
1.0000 g | Freq: Three times a day (TID) | INTRAVENOUS | Status: AC
  Administered 2014-01-07 – 2014-01-08 (×3): 1 g via INTRAVENOUS
  Filled 2014-01-07 (×3): qty 50

## 2014-01-07 MED ORDER — ONDANSETRON HCL 4 MG PO TABS: 4.0000 mg | ORAL_TABLET | Freq: Four times a day (QID) | ORAL | Status: DC | PRN

## 2014-01-07 MED ORDER — HYDROCODONE-ACETAMINOPHEN 5-325 MG PO TABS: 1.0000 | ORAL_TABLET | ORAL | Status: DC | PRN

## 2014-01-07 MED ORDER — GLYCOPYRROLATE 0.2 MG/ML IJ SOLN
INTRAMUSCULAR | Status: DC | PRN
  Administered 2014-01-07: 0.4 mg via INTRAVENOUS

## 2014-01-07 MED ORDER — ROCURONIUM BROMIDE 100 MG/10ML IV SOLN
INTRAVENOUS | Status: DC | PRN
  Administered 2014-01-07: 40 mg via INTRAVENOUS
  Administered 2014-01-07: 30 mg via INTRAVENOUS

## 2014-01-07 MED ORDER — PROPOFOL 10 MG/ML IV BOLUS
INTRAVENOUS | Status: DC | PRN
Start: 2014-01-07 — End: 2014-01-07
  Administered 2014-01-07: 130 mg via INTRAVENOUS

## 2014-01-07 MED ORDER — GLYCOPYRROLATE 0.2 MG/ML IJ SOLN
INTRAMUSCULAR | Status: AC
  Filled 2014-01-07: qty 2

## 2014-01-07 MED ORDER — POTASSIUM CL IN DEXTROSE 5% 20 MEQ/L IV SOLN
20.0000 meq | INTRAVENOUS | Status: DC
  Administered 2014-01-07 – 2014-01-08 (×2): 20 meq via INTRAVENOUS
  Filled 2014-01-07 (×3): qty 1000

## 2014-01-07 SURGICAL SUPPLY — 63 items
BAG DECANTER FOR FLEXI CONT (MISCELLANEOUS) ×4 IMPLANT
BINDER BREAST LRG (GAUZE/BANDAGES/DRESSINGS) ×4 IMPLANT
BINDER BREAST XLRG (GAUZE/BANDAGES/DRESSINGS) IMPLANT
BLADE SURG 10 STRL SS (BLADE) ×4 IMPLANT
BNDG GAUZE ELAST 4 BULKY (GAUZE/BANDAGES/DRESSINGS) ×8 IMPLANT
CANISTER SUCTION 2500CC (MISCELLANEOUS) ×4 IMPLANT
CHLORAPREP W/TINT 26ML (MISCELLANEOUS) ×4 IMPLANT
COVER SURGICAL LIGHT HANDLE (MISCELLANEOUS) ×4 IMPLANT
DRAIN CHANNEL 15F RND FF W/TCR (WOUND CARE) ×8 IMPLANT
DRAPE ORTHO SPLIT 77X108 STRL (DRAPES) ×4
DRAPE PROXIMA HALF (DRAPES) ×8 IMPLANT
DRAPE SURG ORHT 6 SPLT 77X108 (DRAPES) ×4 IMPLANT
DRAPE WARM FLUID 44X44 (DRAPE) ×4 IMPLANT
DRSG PAD ABDOMINAL 8X10 ST (GAUZE/BANDAGES/DRESSINGS) ×8 IMPLANT
DRSG TEGADERM 4X4.75 (GAUZE/BANDAGES/DRESSINGS) ×16 IMPLANT
ELECT BLADE 4.0 EZ CLEAN MEGAD (MISCELLANEOUS) ×4
ELECT COATED BLADE 2.86 ST (ELECTRODE) ×4 IMPLANT
ELECT REM PT RETURN 9FT ADLT (ELECTROSURGICAL) ×4
ELECTRODE BLDE 4.0 EZ CLN MEGD (MISCELLANEOUS) ×2 IMPLANT
ELECTRODE REM PT RTRN 9FT ADLT (ELECTROSURGICAL) ×2 IMPLANT
EVACUATOR SILICONE 100CC (DRAIN) ×8 IMPLANT
GAUZE SPONGE 4X4 12PLY STRL (GAUZE/BANDAGES/DRESSINGS) IMPLANT
GLOVE BIO SURGEON STRL SZ 6 (GLOVE) ×8 IMPLANT
GLOVE BIOGEL PI IND STRL 6.5 (GLOVE) ×2 IMPLANT
GLOVE BIOGEL PI IND STRL 7.0 (GLOVE) ×2 IMPLANT
GLOVE BIOGEL PI INDICATOR 6.5 (GLOVE) ×2
GLOVE BIOGEL PI INDICATOR 7.0 (GLOVE) ×2
GLOVE ECLIPSE 6.5 STRL STRAW (GLOVE) ×12 IMPLANT
GLOVE SURG SS PI 6.0 STRL IVOR (GLOVE) ×8 IMPLANT
GLOVE SURG SS PI 7.0 STRL IVOR (GLOVE) ×4 IMPLANT
GOWN STRL REUS W/ TWL LRG LVL3 (GOWN DISPOSABLE) ×4 IMPLANT
GOWN STRL REUS W/ TWL XL LVL3 (GOWN DISPOSABLE) ×2 IMPLANT
GOWN STRL REUS W/TWL LRG LVL3 (GOWN DISPOSABLE) ×4
GOWN STRL REUS W/TWL XL LVL3 (GOWN DISPOSABLE) ×6 IMPLANT
IMPLANT BREAST GEL 345CC (Breast) ×8 IMPLANT
KIT BASIN OR (CUSTOM PROCEDURE TRAY) ×4 IMPLANT
KIT ROOM TURNOVER OR (KITS) ×4 IMPLANT
LIQUID BAND (GAUZE/BANDAGES/DRESSINGS) ×4 IMPLANT
MENTOR MEMORY SHAPE MH RESTERILIZABLE GEL SIZER ×8 IMPLANT
NS IRRIG 1000ML POUR BTL (IV SOLUTION) ×8 IMPLANT
PACK GENERAL/GYN (CUSTOM PROCEDURE TRAY) ×4 IMPLANT
PAD ARMBOARD 7.5X6 YLW CONV (MISCELLANEOUS) ×4 IMPLANT
PIN SAFETY STERILE (MISCELLANEOUS) ×4 IMPLANT
SET ASEPTIC TRANSFER (MISCELLANEOUS) IMPLANT
SOLUTION BETADINE 4OZ (MISCELLANEOUS) ×8 IMPLANT
STAPLER VISISTAT 35W (STAPLE) ×8 IMPLANT
SUT ETHILON 2 0 FS 18 (SUTURE) ×8 IMPLANT
SUT MNCRL AB 4-0 PS2 18 (SUTURE) ×8 IMPLANT
SUT MON AB 5-0 PS2 18 (SUTURE) ×8 IMPLANT
SUT PDS AB 2-0 CT1 27 (SUTURE) IMPLANT
SUT SILK 3 0 SH 30 (SUTURE) IMPLANT
SUT VIC AB 3-0 PS2 18 (SUTURE)
SUT VIC AB 3-0 PS2 18XBRD (SUTURE) IMPLANT
SUT VIC AB 3-0 SH 27 (SUTURE) ×4
SUT VIC AB 3-0 SH 27X BRD (SUTURE) ×4 IMPLANT
SUT VICRYL 4-0 PS2 18IN ABS (SUTURE) ×8 IMPLANT
SYR BULB 3OZ (MISCELLANEOUS) ×4 IMPLANT
SYR BULB IRRIGATION 50ML (SYRINGE) ×4 IMPLANT
TOWEL OR 17X24 6PK STRL BLUE (TOWEL DISPOSABLE) ×4 IMPLANT
TOWEL OR 17X26 10 PK STRL BLUE (TOWEL DISPOSABLE) ×4 IMPLANT
TRAY FOLEY CATH 16FRSI W/METER (SET/KITS/TRAYS/PACK) IMPLANT
TUBE CONNECTING 12'X1/4 (SUCTIONS) ×1
TUBE CONNECTING 12X1/4 (SUCTIONS) ×3 IMPLANT

## 2014-01-07 NOTE — Interval H&P Note (Signed)
History and Physical Interval Note:  01/07/2014 10:24 AM  Jaime Murillo  has presented today for surgery, with the diagnosis of Acquired absence of breast and nipple, bilateral  History of malignant neoplasm of right breast   The various methods of treatment have been discussed with the patient and family. After consideration of risks, benefits and other options for treatment, the patient has consented to  Procedure(s): BREAST RECONSTRUCTION WITH REMOVAL  OF TISSUE EXPANDER AND PLACEMENT  OF SILICONE IMPLANTS AND LIPO FILLING TO BILATERAL BREAST (Bilateral) LIPO FILLING TO BILATERAL BREAST (Bilateral) as a surgical intervention .  The patient's history has been reviewed, patient examined, no change in status, stable for surgery.  I have reviewed the patient's chart and labs.  Questions were answered to the patient's satisfaction.     Jaime Murillo

## 2014-01-07 NOTE — Anesthesia Postprocedure Evaluation (Signed)
  Anesthesia Post-op Note  Patient: Jaime Murillo  Procedure(s) Performed: Procedure(s): BREAST RECONSTRUCTION WITH REMOVAL  OF TISSUE EXPANDER AND PLACEMENT  OF SILICONE IMPLANTS TO BILATERAL BREAST (Bilateral)  Patient Location: PACU  Anesthesia Type:General  Level of Consciousness: awake, alert  and oriented  Airway and Oxygen Therapy: Patient Spontanous Breathing and Patient connected to nasal cannula oxygen  Post-op Pain: mild  Post-op Assessment: Post-op Vital signs reviewed, Patient's Cardiovascular Status Stable, Respiratory Function Stable, Patent Airway and Pain level controlled  Post-op Vital Signs: stable  Last Vitals:  Filed Vitals:   01/07/14 1647  BP: 141/80  Pulse: 54  Temp: 36.7 C  Resp: 17    Complications: No apparent anesthesia complications

## 2014-01-07 NOTE — Anesthesia Preprocedure Evaluation (Addendum)
Anesthesia Evaluation  Patient identified by MRN, date of birth, ID band Patient awake    Reviewed: H&P , NPO status , Patient's Chart, lab work & pertinent test results, reviewed documented beta blocker date and time   History of Anesthesia Complications (+) PONV  Airway Mallampati: II  TM Distance: >3 FB Neck ROM: Full    Dental  (+) Teeth Intact, Dental Advisory Given,    Pulmonary neg pulmonary ROS,  breath sounds clear to auscultation        Cardiovascular hypertension, Pt. on medications Rhythm:Regular Rate:Normal     Neuro/Psych PSYCHIATRIC DISORDERS Anxiety    GI/Hepatic negative GI ROS, Neg liver ROS,   Endo/Other  negative endocrine ROS  Renal/GU negative Renal ROS     Musculoskeletal   Abdominal   Peds  Hematology   Anesthesia Other Findings   Reproductive/Obstetrics negative OB ROS                            Anesthesia Physical Anesthesia Plan  ASA: III  Anesthesia Plan: General   Post-op Pain Management:    Induction: Intravenous  Airway Management Planned: Oral ETT  Additional Equipment:   Intra-op Plan:   Post-operative Plan:   Informed Consent: I have reviewed the patients History and Physical, chart, labs and discussed the procedure including the risks, benefits and alternatives for the proposed anesthesia with the patient or authorized representative who has indicated his/her understanding and acceptance.   Dental advisory given  Plan Discussed with: CRNA and Anesthesiologist  Anesthesia Plan Comments:         Anesthesia Quick Evaluation

## 2014-01-07 NOTE — Transfer of Care (Signed)
Immediate Anesthesia Transfer of Care Note  Patient: Jaime Murillo  Procedure(s) Performed: Procedure(s): BREAST RECONSTRUCTION WITH REMOVAL  OF TISSUE EXPANDER AND PLACEMENT  OF SILICONE IMPLANTS TO BILATERAL BREAST (Bilateral)  Patient Location: PACU  Anesthesia Type:General  Level of Consciousness: awake, alert , oriented and patient cooperative  Airway & Oxygen Therapy: Patient Spontanous Breathing and Patient connected to nasal cannula oxygen  Post-op Assessment: Report given to PACU RN, Post -op Vital signs reviewed and stable and Patient moving all extremities  Post vital signs: Reviewed and stable  Complications: No apparent anesthesia complications

## 2014-01-07 NOTE — Op Note (Signed)
Operative Note   DATE OF OPERATION: 12.14.2015  LOCATION: [facility]- outpatient  SURGICAL DIVISION: Plastic Surgery  PREOPERATIVE DIAGNOSES:  1. Acquired absence bilateral breasts 2. History breast cancer 3. Family history breast cancer  POSTOPERATIVE DIAGNOSES:  same  PROCEDURE:  Removal bilateral tissue expanders and placement silicone implants  SURGEON: Irene Limbo MD MBA  ASSISTANT: none  ANESTHESIA:  General.   EBL: 333 ml  COMPLICATIONS: None.   INDICATIONS FOR PROCEDURE:  The patient, Jaime Murillo, is a 53 y.o. female born on 04-05-1960, is here for second stage reconstruction following bilateral areola sparing mastectomies.   FINDINGS: Mentor Memory Shape 345 ml (Medium Height, High Projection) anatomic implants placed bilaterally. Ref [id number]. Right SN [id number] Left SN [id number]  DESCRIPTION OF PROCEDURE:  The patient was marked in preoperative area to mark sternal notch, chest midline, anterior axillary lines.  The patient was taken to the operating room. SCDs were placed and IV antibiotics were given. The patient's operative site was prepped and draped in a sterile fashion. A time out was performed and all information was confirmed to be correct. Incision made through prior mastectomy scar on right and capsule incised. The expanders were removed. Examination of cavity demonstrated well incorporated acellular dermis. Anterior capsulectomy performed to aid with adherence of textured anatomic implants. Sizer placed in cavity and tailor tacked closed.   I then directed attention to left breast. Similarly, anterior capsulectomy performed. 345 ml shaped, medium hight, high profile implants were selected.   The pockets were irrigated with solution containing ancef, gentamicin, and bacitracin and inspected for hemostasis. 15 Fr drain was placed in each cavity and secured to skin with 2-0 nylon suture. Mentor Memory Gel 345 ml MH implants placed in each cavity.  Incision closed in layers using 3-0 vicryl to approximate muscle and capsule. 4-0 vicryl used in dermis and skin closure completed with 4-0 monocryl subcuticular. Skin adhesive and dry dressing, breast binder applied.   The patient was allowed to wake from anesthesia, extubated and taken to the recovery room in satisfactory condition.   SPECIMENS: none  DRAINS: 15 Fr JP in right and left reconstructed breast  Irene Limbo, MD [facility] Plastic & Reconstructive Surgery [phone]

## 2014-01-08 ENCOUNTER — Encounter (HOSPITAL_COMMUNITY): Payer: Self-pay | Admitting: Plastic Surgery

## 2014-01-08 DIAGNOSIS — Z421 Encounter for breast reconstruction following mastectomy: Secondary | ICD-10-CM | POA: Diagnosis not present

## 2014-01-08 NOTE — Discharge Summary (Signed)
Physician Discharge Summary  Patient ID: Jaime Murillo MRN: [id number] DOB/AGE: Oct 07, 1960 53 y.o.  Admit date: 01/07/2014 Discharge date: 01/08/2014  Admission Diagnoses: acquired absence bilateral breasts, history breast cancer  Discharge Diagnoses:  Active Problems:   Acquired absence of breast and nipple   Discharged Condition: stable  Hospital Course: Patient disoriented post procedure with associated nausea. This resolve by am POD#1. Tolerating diet and minimal pain requirements.  Consults: None  Treatments: surgery: removal bilateral tissue expanders and placement silicone implants  Discharge Exam: Blood pressure 114/65, pulse 59, temperature 97.8 F (36.6 C), temperature source Oral, resp. rate 16, height 5\' 3"  (1.6 m), weight 52.164 kg (115 lb), SpO2 98 %. Incision/Wound:bilateral breasts soft with intact incisions. Drains serosanguinous.  Disposition: 01-Home or Self Care  Discharge Instructions    Call MD for:  redness, tenderness, or signs of infection (pain, swelling, bleeding, redness, odor or green/yellow discharge around incision site)    Complete by:  As directed      Call MD for:  severe or increased pain, loss or decreased feeling  in affected limb(s)    Complete by:  As directed      Call MD for:  temperature >100.5    Complete by:  As directed      Discharge instructions    Complete by:  As directed   Ok to shower am 01/09/14. Pat incisions dry. Soft support bra or breast binder all times.  Strip and record JP twice daily.  No strenuous activity or exercise.     Driving Restrictions    Complete by:  As directed   No driving while taking anrcotics     Lifting restrictions    Complete by:  As directed   No lifting greater than 5 lbs until follw up up visit     Resume previous diet    Complete by:  As directed             Medication List    STOP taking these medications        traMADol 50 MG tablet  Commonly known as:  ULTRAM      TAKE  these medications        bisacodyl 10 MG suppository  Commonly known as:  DULCOLAX  Place 1 suppository (10 mg total) rectally daily as needed (no bowel movement).     diazepam 5 MG tablet  Commonly known as:  VALIUM  Take 1 tablet (5 mg total) by mouth every 8 (eight) hours as needed for anxiety or muscle spasms.     ferrous sulfate 325 (65 FE) MG tablet  Take 325 mg by mouth daily with breakfast.     hydrochlorothiazide 25 MG tablet  Commonly known as:  HYDRODIURIL  Take 1 tablet (25 mg total) by mouth daily.     HYDROcodone-acetaminophen 5-325 MG per tablet  Commonly known as:  NORCO/VICODIN  Take 1-2 tablets by mouth every 4 (four) hours as needed for moderate pain or severe pain.     ibuprofen 600 MG tablet  Commonly known as:  ADVIL,MOTRIN  Take 600 mg by mouth every 6 (six) hours as needed for moderate pain.     polyethylene glycol packet  Commonly known as:  MIRALAX / GLYCOLAX  Take 17 g by mouth 2 (two) times daily.     sulfamethoxazole-trimethoprim 800-160 MG per tablet  Commonly known as:  BACTRIM DS,SEPTRA DS  Take 1 tablet by mouth 2 (two) times daily.     SUPER B  COMPLEX PO  Take 1 tablet by mouth daily.     valACYclovir 500 MG tablet  Commonly known as:  VALTREX  Take 1 tablet (500 mg total) by mouth daily.           Follow-up Information    Follow up with [name], Arnoldo Hooker, MD In 1 week.   Specialty:  Plastic Surgery   Why:  as scheduled   Contact information:   [address] [place] [address] [phone]       Signed: Irene Limbo 01/08/2014, 10:31 AM

## 2014-01-08 NOTE — Progress Notes (Signed)
Discharge instructions reviewed with patient. Questions answered re: next dose of pain med. Patient able to answer teach back questions. Patient verbalizes JP drain home care. Printed AVS,prescriptions, and JP instruction sheet given to patient. Patient to discharge to home via wheelchair. Accompanied by family

## 2014-02-28 ENCOUNTER — Ambulatory Visit (INDEPENDENT_AMBULATORY_CARE_PROVIDER_SITE_OTHER): Payer: Medicaid Other | Admitting: Nurse Practitioner

## 2014-02-28 ENCOUNTER — Encounter: Payer: Self-pay | Admitting: Nurse Practitioner

## 2014-02-28 VITALS — BP 136/97 | HR 76 | Ht 63.0 in | Wt 121.0 lb

## 2014-02-28 DIAGNOSIS — G629 Polyneuropathy, unspecified: Secondary | ICD-10-CM

## 2014-02-28 MED ORDER — L-METHYLFOLATE-B6-B12 3-35-2 MG PO TABS: 1.0000 | ORAL_TABLET | Freq: Every day | ORAL | Status: DC

## 2014-02-28 NOTE — Patient Instructions (Signed)
Continue gabapentin 300 mg at night Metanx twice daily Continue Cymbalta may reduce to 30 mg if necessary Follow-up in 4-6 months

## 2014-02-28 NOTE — Progress Notes (Signed)
[facility]  PATIENT: Jaime Murillo DOB: March 20, 1960   REASON FOR VISIT: follow up for peripheral neuropathy HISTORY FROM:patient    HISTORY OF PRESENT ILLNESS:Ms. 54, 54 year old female returns for followup she was initially evaluated for peripheral neuropathy by Dr. Krista Blue 02/08/2013. She was last seen in the office 05/25/2013 History of carcinoma of the right breast and received chemotherapy consisting of Adriamycin,Cytoxan followed by weekly Taxol and carboplatinum, planned for a total of 12 weeks, beginning 09/11/2012. After the third treatment she began to notice her finger tips with numbness and tingling and dropping things. Later she began to notice the same symptoms in her toes and feet. She was originally given gabapentin without improvement then Lyrica without improvement and she is now on Cymbalta 60 mg daily. Sometimes she does not take the medication. She had  left breast surgery in December 2015 She has recently been placed on OxyContin after her reconstruction surgery. She takes Ambien at night to help her sleep. She is attempting to go to school but she is having trouble focusing and she has been advised to go part-time. She is tearful today because her son was expelled from school. Due to drowsiness during the day she does not always take the gabapentin as directed .She returns for reevaluation.   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 Tumor was grade 3.  She began to receive chemotherapy, initially consistent of Adriamycin,Cytoxan followed by weekly Taxol and carboplatinum, planned for a total of 12 weeks, beginning 09/11/2012.  After third treatment in early Sep 2014, she began to notice  fingertips paresthesia, numbness tingling, drop things out of her hands, could not feel things with her fingertips, later also noticed bilateral toes, sole paresthesia, instead of getting the planned 12 treatment, she only got 6 treatment because progressive worsening bilateral feet and hands paresthesia.  She also described generalized weakness, nausea, legs out underneath her, has fell few times. She denies significant low back pain, no bowel bladder incontinence  She was given gabapentin 300 mg in the morning, 400 mg at nighttime, with no significant improvement  She is planning on to have double mastectomy followed by reconstruction surgery in February 2015 right axillary lymph node biopsy was negative,   REVIEW OF SYSTEMS: Full 14 system review of systems performed and notable only for those listed, all others are neg:  Constitutional: neg  Cardiovascular: neg Ear/Nose/Throat: neg  Skin: neg Eyes: neg Respiratory: neg Gastrointestinal Constipation Hmatology/Lymphatic: neg  Endocrine: neg  Musculoskeletal Muscle cramps  Allergy/Immunology: neg Neurologic Numbness memory loss  Psychiatric:  depression anxiety  Sleep : neg   ALLERGIES: Allergies  Allergen Reactions  . Lyrica [Pregabalin] Other (See Comments)    Hallucinations and bad dreams    HOME MEDICATIONS: Outpatient Prescriptions Prior to Visit  Medication Sig Dispense Refill  . B Complex-C (SUPER B COMPLEX PO) Take 1 tablet by mouth daily.     . bisacodyl (DULCOLAX) 10 MG suppository Place 1 suppository (10 mg total) rectally daily as needed (no bowel movement). 12 suppository 0  . diazepam (VALIUM) 5 MG tablet Take 1 tablet (5 mg total) by mouth every 8 (eight) hours as needed for anxiety or muscle spasms. 20 tablet 0  . ferrous sulfate 325 (65 FE) MG tablet  Take 325 mg by mouth daily with breakfast.    . hydrochlorothiazide (HYDRODIURIL) 25 MG tablet Take 1 tablet (25 mg total) by mouth daily. 30 tablet 5  .  HYDROcodone-acetaminophen (NORCO/VICODIN) 5-325 MG per tablet Take 1-2 tablets by mouth every 4 (four) hours as needed for moderate pain or severe pain. 50 tablet 0  . ibuprofen (ADVIL,MOTRIN) 600 MG tablet Take 600 mg by mouth every 6 (six) hours as needed for moderate pain.    . polyethylene glycol (MIRALAX / GLYCOLAX) packet Take 17 g by mouth 2 (two) times daily. (Patient taking differently: Take 17 g by mouth daily as needed for moderate constipation. ) 14 each 0  . sulfamethoxazole-trimethoprim (BACTRIM DS,SEPTRA DS) 800-160 MG per tablet Take 1 tablet by mouth 2 (two) times daily. 12 tablet 0  . valACYclovir (VALTREX) 500 MG tablet Take 1 tablet (500 mg total) by mouth daily. 30 tablet 3   No facility-administered medications prior to visit.    PAST MEDICAL HISTORY: Past Medical History  Diagnosis Date  . HSV infection     takes Valtrex daily  . Urinary frequency   . Breast cancer 2014    triple negative  . Nausea     takes Compazine and Zofran as needed  . Anemia     takes Ferrous Sulfate daily  . Depression     takes Cymbalta daily  . Insomnia     takes Ambien nightly as needed  . Hypertension     takes HCTZ daily  . Dizziness     r/t meds  . Neuropathy     takes Lyrica daily and alternates with Gabapentin  . Joint pain   . Nocturia   . History of blood transfusion     no abnormal reaction noted  . Anxiety   . Vision blurred 02/2013  . Neuropathy of both feet     DUE TO CHEMO PER PATIENT  . Wears glasses   . PONV (postoperative nausea and vomiting)     shaking after anesthesia; had n/v after mastectomy  . Herpes     PAST SURGICAL HISTORY: Past Surgical History  Procedure Laterality Date  . Tubal ligation  2003    LTL  . Portacath placement N/A 07/06/2012    Procedure: INSERTION PORT-A-CATH;  Surgeon: Stark Klein, MD;  Location: [place];  Service: General;  Laterality: N/A;  . Port-a-cath removal Left 01/12/2013    Procedure: REMOVAL PORT-A-CATH;  Surgeon:  Stark Klein, MD;  Location: [place];  Service: General;  Laterality: Left;  . Breast lumpectomy Right 01/12/2013    Procedure: RIGHT SENTINEL LYMPH  NODE BIOPSY;  Surgeon: Stark Klein, MD;  Location: [place];  Service: General;  Laterality: Right;  RIGHT SENTINEL LYMPH NODE BIOPSY  . Colonoscopy    . Total mastectomy Right 03/20/2013    Procedure: RIGHT SKIN SPARING TOTAL MASTECTOMY;  Surgeon: Stark Klein, MD;  Location: [place];  Service: General;  Laterality: Right;  . Breast reconstruction with placement of tissue expander and flex hd (acellular hydrated dermis) Right 03/20/2013    Procedure: IMMEDIATE RIGHT BREAST RECONSTRUCTION WITH PLACEMENT OF TISSUE EXPANDER AND FLEX HD (ACELLULAR HYDRATED DERMIS);  Surgeon: Irene Limbo, MD;  Location: [place];  Service: Plastics;  Laterality: Right;  . Simple mastectomy with axillary sentinel node biopsy Left 07/24/2013    Procedure: SIMPLE MASTECTOMY;  Surgeon: Stark Klein, MD;  Location: [place];  Service: General;  Laterality: Left;  . Breast reconstruction with  placement of tissue expander and flex hd (acellular hydrated dermis) Left 07/24/2013    Procedure: BREAST RECONSTRUCTION WITH PLACEMENT OF TISSUE EXPANDER AND FLEX HD (ACELLULAR HYDRATED DERMIS) TO THE LEFT BREAST;  Surgeon: Irene Limbo, MD;  Location: [place];  Service: Plastics;  Laterality: Left;  Marland Kitchen Mastectomy Bilateral 03/20/2013 & June 2015    WITH RECONSRTUCTION          DR Barry Dienes   . Breast reconstruction with placement of tissue expander and flex hd (acellular hydrated dermis) Bilateral 01/07/2014    Procedure: BREAST RECONSTRUCTION WITH REMOVAL  OF TISSUE EXPANDER AND PLACEMENT  OF SILICONE IMPLANTS TO BILATERAL BREAST;  Surgeon: Irene Limbo, MD;  Location: [place];  Service: Plastics;  Laterality: Bilateral;    FAMILY HISTORY: Family History  Problem Relation Age of Onset  . Heart disease Mother   .  Aneurysm Mother   . Hypertension Father   . Prostate cancer Father 34  . Hypertension Sister   . Ovarian cancer Paternal Grandmother     died in her 13s  . Stomach cancer Maternal Grandmother 60  . Cancer Cousin     paternal cousin with an unknown form of cancer    SOCIAL HISTORY: History   Social History  . Marital Status: Single    Spouse Name: N/A    Number of Children: 2  . Years of Education: 13   Occupational History  .      Disabled   Social History Main Topics  . Smoking status: Never Smoker   . Smokeless tobacco: Never Used  . Alcohol Use: No  . Drug Use: No  . Sexual Activity: Yes    Birth Control/ Protection: Surgical     Comment: BTL   Other Topics Concern  . Not on file   Social History Narrative   Patient lives at home with her son she is single.   Disabled.   Education- One year of college.   Right handed.   Caffeine- One cup daily.     PHYSICAL EXAM  Filed Vitals:   02/28/14 1521  BP: 136/97  Pulse: 76  Height: 5\' 3"  (1.6 m)  Weight: 121 lb (54.885 kg)   Body mass index is 21.44 kg/(m^2).  Generalized: Well developed, tearful during the exam   Head: normocephalic and atraumatic,. Oropharynx benign  Neck: Supple, no carotid bruits  Cardiac: Regular rate rhythm, no murmur  Musculoskeletal: No deformity   Neurological examination   Mentation: Alert oriented to time, place, history taking. Attention span and concentration appropriate. Recent and remote memory intact.  Follows all commands speech and language fluent.   Cranial nerve II-XII: Fundoscopic exam reveals sharp disc margins.Pupils were equal round reactive to light extraocular movements were full, visual field were full on confrontational test. Facial sensation and strength were normal. hearing was intact to finger rubbing bilaterally. Uvula tongue midline. head turning and shoulder shrug were normal and symmetric.Tongue protrusion into cheek strength was normal. Motor: normal  bulk and tone, full strength in the BUE, BLE, fine finger movements normal, no pronator drift. No focal weakness Sensory:  decreased pinprick to midshin, mild decreased vibratory to ankles, intact to fine touch and proprioception   Coordination: finger-nose-finger, heel-to-shin bilaterally, no dysmetria Reflexes: Brachioradialis 2/2, biceps 2/2, triceps 2/2, patellar 2/2, Achilles 2/2, plantar responses were flexor bilaterally. Gait and Station: Rising up from seated position without assistance,  mild antalgic gait   moderate stride, good arm swing, smooth turning, able to perform tiptoe, and  heel walking without difficulty. Tandem gait is unsteady. No assistive device   DIAGNOSTIC DATA (LABS, IMAGING, TESTING) - I reviewed patient records, labs, notes, testing and imaging myself where available.  Lab Results  Component Value Date   WBC 2.9* 12/31/2013   HGB 12.1 12/31/2013   HCT 35.7* 12/31/2013   MCV 90.2 12/31/2013   PLT 222 12/31/2013      Component Value Date/Time   NA 139 12/31/2013 0850   NA 138 03/28/2013 1107   K 3.9 12/31/2013 0850   K 3.4* 03/28/2013 1107   CL 100 12/31/2013 0850   CL 104 07/17/2012 1357   CO2 27 12/31/2013 0850   CO2 31* 03/28/2013 1107   GLUCOSE 80 12/31/2013 0850   GLUCOSE 71 03/28/2013 1107   GLUCOSE 93 07/17/2012 1357   BUN 16 12/31/2013 0850   BUN 14.7 03/28/2013 1107   CREATININE 0.70 12/31/2013 0850   CREATININE 0.9 03/28/2013 1107   CREATININE 0.70 06/06/2012 0853   CALCIUM 9.6 12/31/2013 0850   CALCIUM 9.6 03/28/2013 1107   PROT 7.3 03/28/2013 1107   PROT 6.9 06/06/2012 0853   ALBUMIN 3.7 03/28/2013 1107   ALBUMIN 3.9 06/06/2012 0853   AST 25 03/28/2013 1107   AST 14 06/06/2012 0853   ALT 14 03/28/2013 1107   ALT 9 06/06/2012 0853   ALKPHOS 52 03/28/2013 1107   ALKPHOS 35* 06/06/2012 0853   BILITOT 0.40 03/28/2013 1107   BILITOT 0.8 06/06/2012 0853   GFRNONAA >90 12/31/2013 0850   GFRAA >90 12/31/2013 0850    No results found  for: HGBA1C Lab Results  Component Value Date   HBZJIRCV89 3810* 02/08/2013   Lab Results  Component Value Date   TSH 1.650 02/08/2013      ASSESSMENT AND PLAN  54 y.o. year old female  has a past medical history of HSV infection; Urinary frequency; Breast cancer (2014); Nausea; Anemia; Depression; Insomnia; Hypertension; Dizziness; Neuropathy; Joint pain; Nocturia; History of blood transfusion; Anxiety; Vision blurred (02/2013); Neuropathy of both feet;  and Herpes. Here to follow-up for her neuropathy which is unstable. Patient needs a note for school to cut back her hours due to her numbness and pain and inability to focus. Made aware I need demographic information for this.  Continue gabapentin 300 mg at night Metanx twice daily Continue Cymbalta may reduce to 30 mg if necessary Follow-up in 4-6 months Dennie Bible, [facility], [facility], [facility] [address] [place], [place] [address] [phone]

## 2014-03-01 NOTE — Progress Notes (Signed)
I have reviewed and agreed above plan. 

## 2014-03-05 ENCOUNTER — Telehealth: Payer: Self-pay | Admitting: *Deleted

## 2014-03-05 NOTE — Telephone Encounter (Signed)
[facility], sent to [place] and Hoyle Sauer to be completed 03-05-14.

## 2014-03-06 DIAGNOSIS — Z0289 Encounter for other administrative examinations: Secondary | ICD-10-CM

## 2014-03-06 NOTE — Progress Notes (Signed)
I have reviewed and agreed above plan. 

## 2014-03-08 NOTE — Telephone Encounter (Signed)
Gave form to [place] to complete.

## 2014-03-11 IMAGING — CT CT HEAD W/O CM
1 series · 16 of 30 positions shown, 20 images · non-contrast
Comparison: None

CLINICAL DATA: Postop right mastectomy. New onset right-sided
weakness and difficulty moving right arm and leg. Dizziness with
unsteady gait.

EXAM:
CT HEAD WITHOUT CONTRAST
TECHNIQUE: Contiguous axial images were obtained from the base of the skull
through the vertex without contrast.

[Series 2: head 5.0 h30s · axial · 0.39mm/px · z∈[-148,-13]mm · 16 of 31 slices shown, 20 images]
[im 2/31  brain]
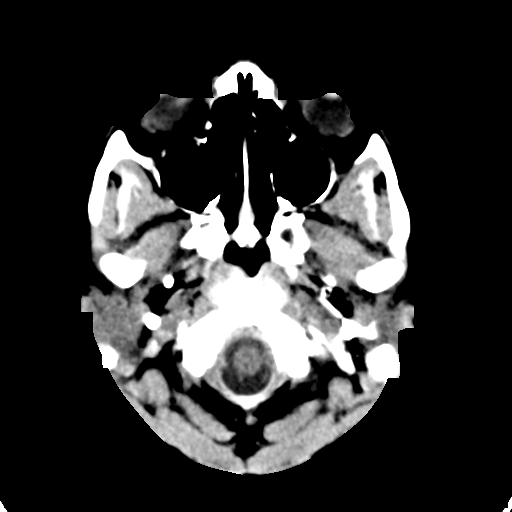
[im 2/31  bone]
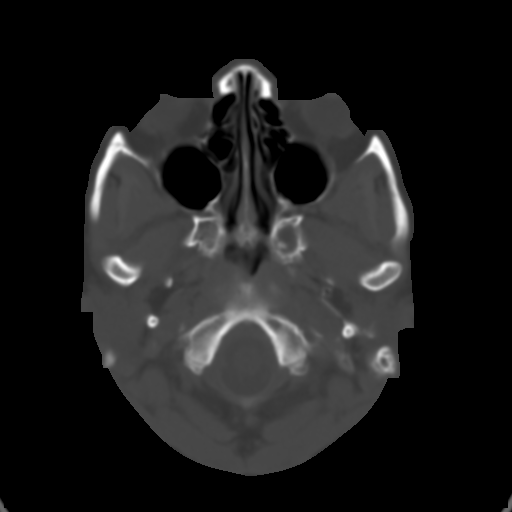
[im 4/31  brain]
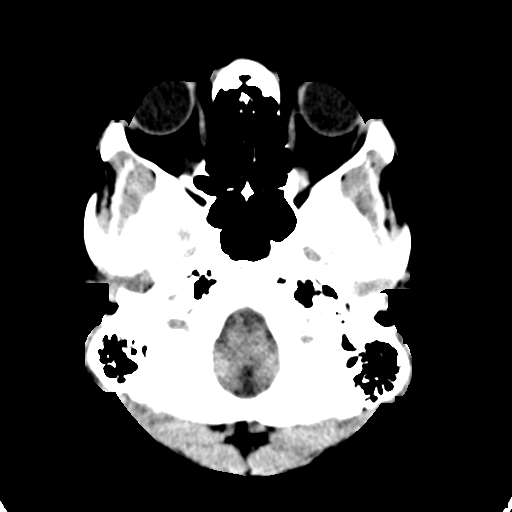
[im 6/31  brain]
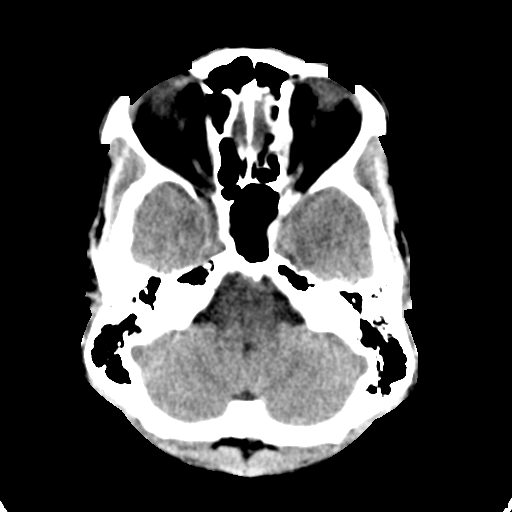
[im 8/31  brain]
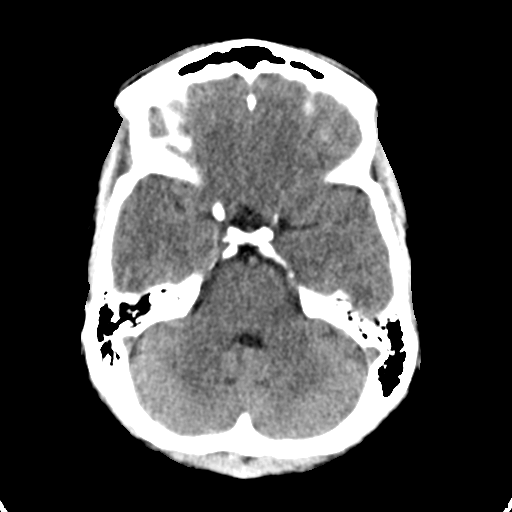
[im 9/31  brain]
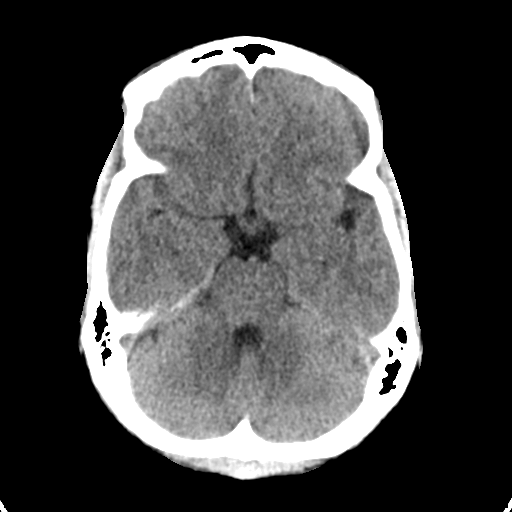
[im 9/31  bone]
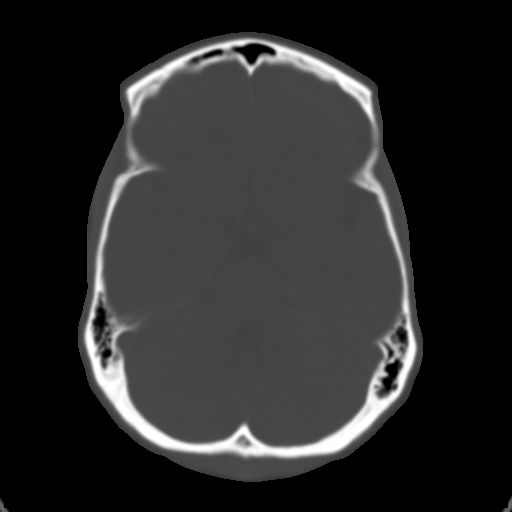
[im 11/31  brain]
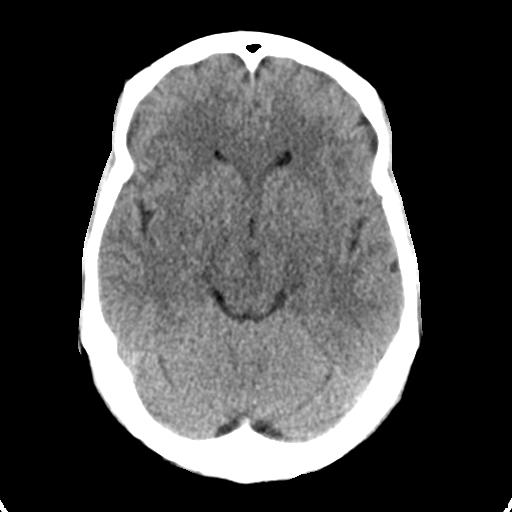
[im 13/31  brain]
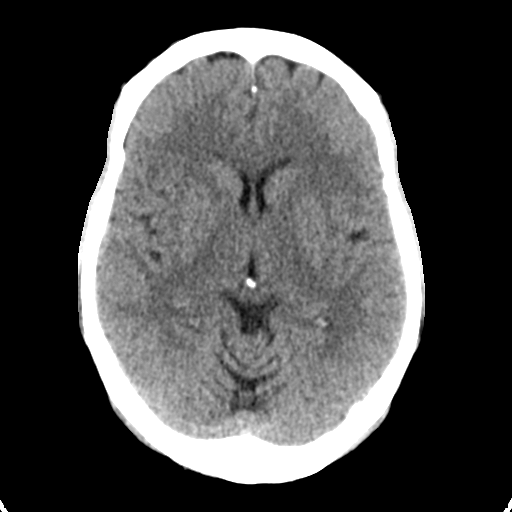
[im 15/31  brain]
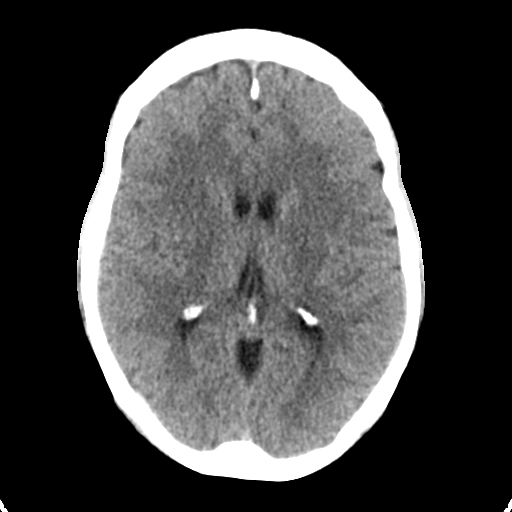
[im 16/31  brain]
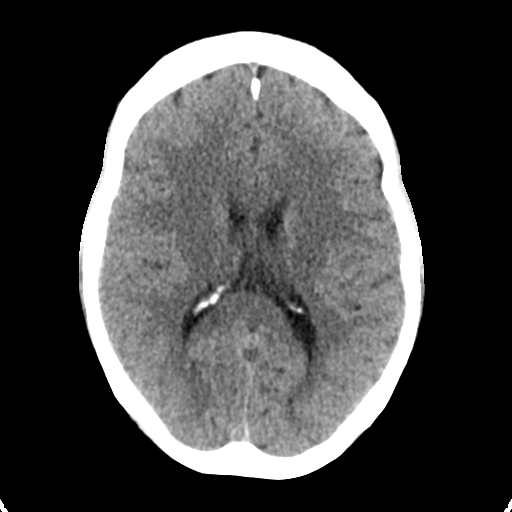
[im 16/31  bone]
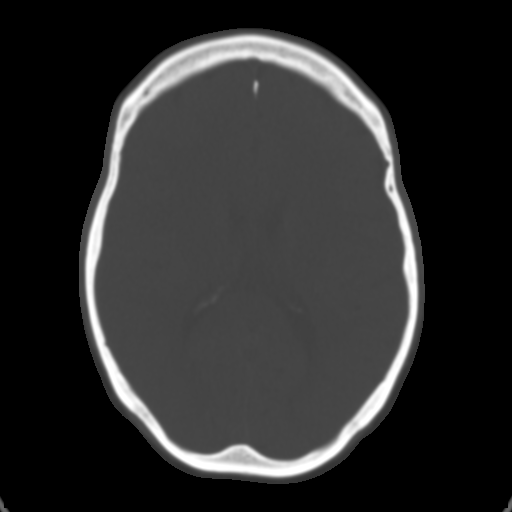
[im 18/31  brain]
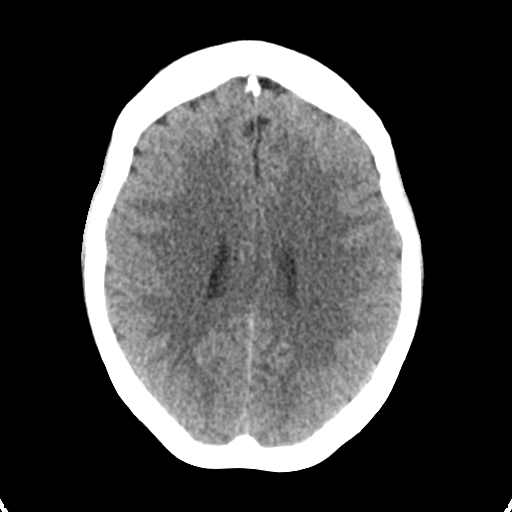
[im 20/31  brain]
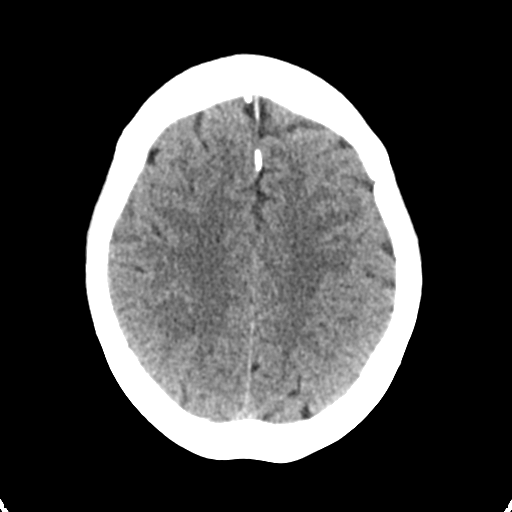
[im 22/31  brain]
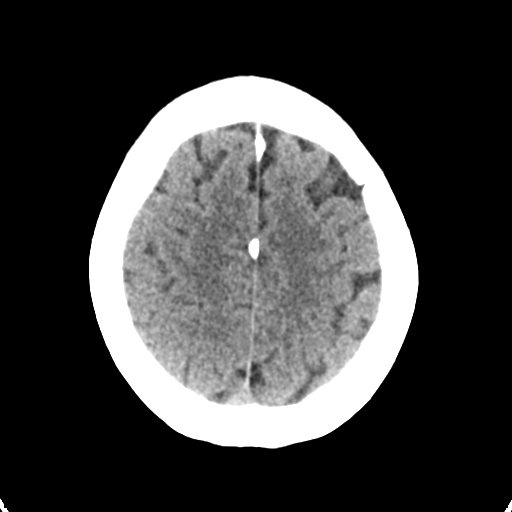
[im 23/31  brain]
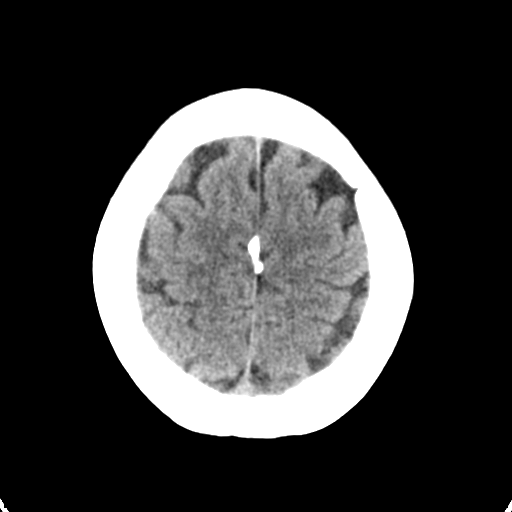
[im 23/31  bone]
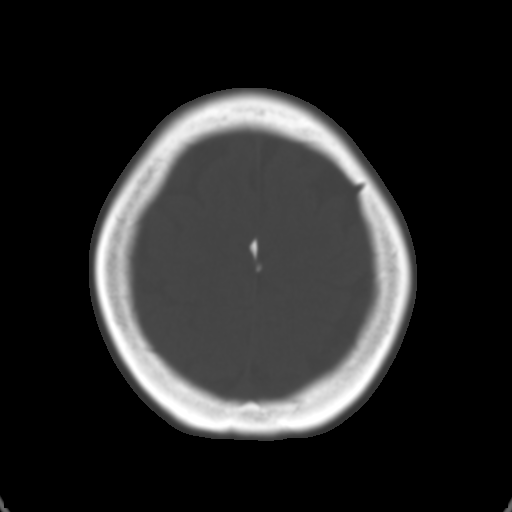
[im 25/31  brain]
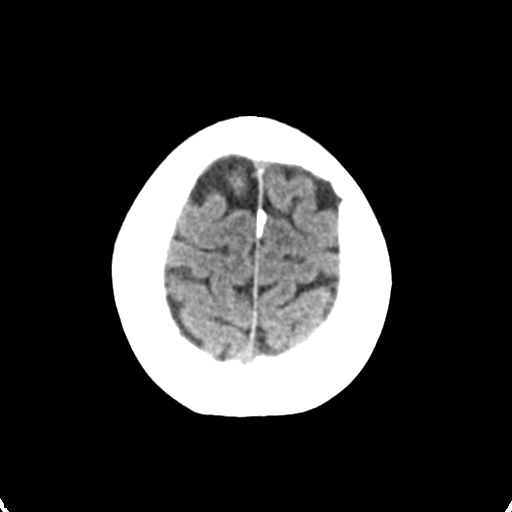
[im 27/31  brain]
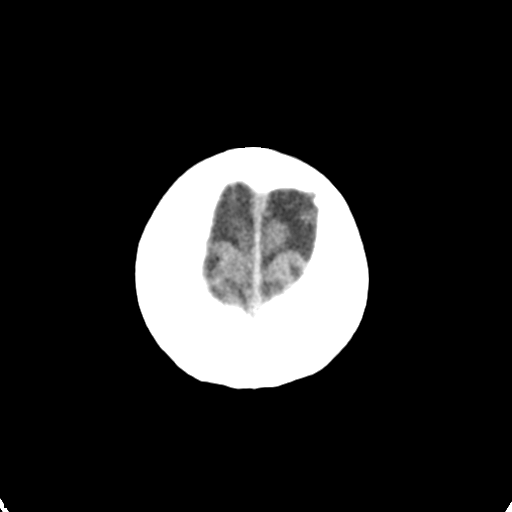
[im 29/31  brain]
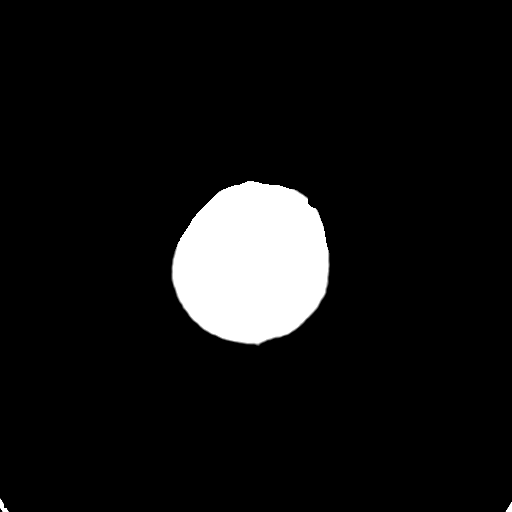

[16 of 30 positions shown; findings below may reference images not displayed]

FINDINGS: No evidence for acute infarction, hemorrhage, mass lesion,
hydrocephalus, or extra-axial fluid. There is no atrophy or white
matter disease. No CT signs of proximal vascular thrombosis. No
proximal vascular calcification.

Calvarium intact.  Negative orbits, sinuses, and mastoids.
IMPRESSION: Negative exam.

## 2014-03-13 ENCOUNTER — Encounter: Payer: Self-pay | Admitting: Nurse Practitioner

## 2014-03-13 NOTE — Telephone Encounter (Signed)
Gave forms to medical records to fwd.

## 2014-03-14 ENCOUNTER — Telehealth: Payer: Self-pay | Admitting: *Deleted

## 2014-03-14 NOTE — Telephone Encounter (Signed)
Letter,received,completed by Hoyle Sauer faxed to [facility] and mailed copy to patient 03-14-14.

## 2014-05-20 ENCOUNTER — Telehealth: Payer: Self-pay | Admitting: *Deleted

## 2014-05-20 NOTE — Telephone Encounter (Signed)
Pt called and relayed she would like to return to [facility]. She left d/t Dr. Humphrey Rolls leaving and she was very upset. Informed pt to send her records from [facility] and we will get her scheduled with a new med onc. Received understanding.

## 2014-05-21 ENCOUNTER — Telehealth: Payer: Self-pay | Admitting: *Deleted

## 2014-05-21 NOTE — Telephone Encounter (Signed)
Received MR from [facility]. Scheduled and confirmed appt with Dr. Lindi Adie on 06/28/14 at 1230. Denies further needs at this time.

## 2014-05-28 ENCOUNTER — Telehealth: Payer: Self-pay | Admitting: Adult Health

## 2014-05-28 NOTE — Telephone Encounter (Signed)
Jaime [place] contacted me to request a Survivorship Clinic appointment after briefly meeting me last evening in the [facility] ("Finding Your New Normal") survivorship support group series.  She is a previous patient of Dr. Humphrey Rolls, who tells me that she was received part of her treatment here in [place] and part of her treatment/follow-up at [facility].  She has since decided to return her care to [facility] in [place].    I discussed with [name] the purpose of survivorship and what I hoped to accomplish in our first visit together.  She is now scheduled to see me on Monday, May 9th.  I look forward to participating in her care.   Mike Craze, NP [place] [phone]

## 2014-06-03 ENCOUNTER — Ambulatory Visit (HOSPITAL_BASED_OUTPATIENT_CLINIC_OR_DEPARTMENT_OTHER): Payer: Medicaid Other | Admitting: Adult Health

## 2014-06-03 VITALS — BP 176/102 | HR 75 | Temp 98.5°F | Resp 16

## 2014-06-03 DIAGNOSIS — Z9221 Personal history of antineoplastic chemotherapy: Secondary | ICD-10-CM

## 2014-06-03 DIAGNOSIS — G629 Polyneuropathy, unspecified: Secondary | ICD-10-CM | POA: Diagnosis not present

## 2014-06-03 DIAGNOSIS — C50311 Malignant neoplasm of lower-inner quadrant of right female breast: Secondary | ICD-10-CM | POA: Diagnosis not present

## 2014-06-03 DIAGNOSIS — R4189 Other symptoms and signs involving cognitive functions and awareness: Secondary | ICD-10-CM

## 2014-06-03 DIAGNOSIS — N951 Menopausal and female climacteric states: Secondary | ICD-10-CM

## 2014-06-04 ENCOUNTER — Encounter: Payer: Self-pay | Admitting: Adult Health

## 2014-06-04 NOTE — Progress Notes (Signed)
CLINIC:  Cancer Survivorship   REASON FOR VISIT:  Routine follow-up post-treatment for a recent history of breast cancer.  BRIEF ONCOLOGIC HISTORY:    Cancer of lower-inner quadrant of RIGHT female breast   06/20/2012 Initial Biopsy Right breast needle core biopsy (3 o'clock): Grade 3, IDC & DCIS.  ER- (0%), PR- (0%), HER2- (ratio 1.26). Ki67 95%.    06/20/2012 Breast US Right breast with slightly irregular oval hypoechoic mass measuring 7 x 6 x 17 mm at 3 o'clock postion. No mammographic evidence of breast malignancy bilaterally. Palpable area of concern at 12 o'clock, but no suspicious findings at this location on [place].    06/23/2012 Initial Diagnosis Cancer of lower-inner quadrant of RIGHT female breast   06/26/2012 Breast MRI Right breast w/clumped NME at 2 o'clock position measuring 0.8 x 1.6 x 2.2 cm located anterior/superior to known cancer. Also 6 mm ill-defined ovoid mass posterior lateral to bx-proven ca, lying superficial pect. muscle. Likely multifocal disease.    07/03/2012 Echocardiogram Pre-treatment EF: 55-60%   07/03/2012 Imaging CT c/a/p: No evidence of metastatic disease.    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  Also heteroxygous for 2 likely benign variants on BRCA1 and NBN.  Remainder of genes in GeneDX panel negative.    07/12/2012 Procedure Right breast needle core biopsy: Benign breast tissue with focal pseudoangiomatous stromal hyperplasic (PASH). No evidence of malignancy.    07/17/2012 - 08/28/2012 Neo-Adjuvant Chemotherapy Adriamycin & Cytoxan x 4 cycles completed.    09/11/2012 - 10/23/2012 Neo-Adjuvant Chemotherapy Taxol/Carboplatin weekly x 7 cycles (stopped due to peripheral neuropathy; changed to Gemcitabine/Carbo for additional 3 cycles).   11/06/2012 - 12/04/2012 Neo-Adjuvant Chemotherapy Gemcitabine/Carboplatin x 3 cycles completed.    11/24/2012 Breast MRI Previous right  breast multifocal malignancy with complete imaging resolution.  Stable 6 mm hemangioma or cyst in liver.    01/12/2013 Surgery Right axillary SLNB ([place]):  3 SLNs negative.  Port also removed on this date.    03/20/2013 Definitive Surgery Right skin-sparing total mastectomy Barry Dienes) with immediate reconstruction: No atypia or malignancy identified.  Benign skin & nipple. s/p neoadjuvant therapy.  Represents a complete response. Placement of tissue expanders.    03/20/2013 Pathologic Stage ypT0; No evidence of cancer.    07/24/2013 Surgery LEFT simple mastectomy with immediate reconstruction; showed ALH and fibroadenoma. Placement of tissue expanders.    01/07/2014 Surgery Bilateral breast reconstruction ([place]): Removal of bilateral tissue expanders and placement of bilateral silicone breast implants.    06/03/2014 Survivorship Survivorship Care Plan given to patient and reveiwed with her during in-person visit.     INTERVAL HISTORY:  Jaime Murillo presents to the [facility] today for our initial meeting to review her survivorship care plan detailing her treatment course for breast cancer, as well as monitoring long-term side effects of that treatment, education regarding health maintenance, screening, and overall wellness and health promotion.     Jaime Murillo completed her active anti-cancer treatment in 2015, which included neoadjuvant chemotherapy and bilateral mastectomies with reconstruction.  She completed her breast reconstruction with placement of bilateral silicone implants in 87/6811.  Since that time, Jaime Murillo has been doing quite well with the exception of a few current symptoms.  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).  She recently  heard a presentation at the cancer center from a medical professional regarding "chemo brain" and feels  that her symptoms coincide with her history of receiving chemotherapy for the breast cancer.  She is also experiencing hot flashes (worse at night, but manageable with an herbal supplement, "I can't remember the name"), vaginal dryness (she currently uses lubricant for intercourse), , decreased libido, and some urinary stress incontinence. She has not had a menstrual cycle since she completed chemotherapy in 11/2012. 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  She sees a neurology specialist for this who has prescribed her gabapentin, but she doesn't take it very often because she doesn't like the way the medication makes her feel. "It gives me bad thoughts and then I have to take another medicine-the Cymbalta-to combat the bad thoughts, so I try not to take the gabapentin unless I absolutely have to have it."  She reports that the neuropathy is worse at night and does eventually subside so that she can rest, sometimes without intervention.  Lastly, she endorses fatigue, but she states, "It's hard for me to know if it is just because I am so busy with school and as a Mom or if it is related to something else."  She tells me that she does not formally exercise, but she does do a lot of walking on [employer] campus for her classes.  She reports eating well and having a good appetite. She has a great support system in her family, friends, and her church family.  She helped to start a small cancer support group at her church, for which she usually facilitates and is very proud of that.   REVIEW OF SYSTEMS:  General: Denies fever, chills, unintentional weight loss.  Endorses generalized fatigue.  HEENT: Endorses concerns regarding her teeth. She had to have several teeth removed d/t chemotherapy and wears a partial; she has regular follow-ups with her dentist.    Cardiac: Denies palpitations, chest pain, and lower extremity edema.  Respiratory: Denies  cough, shortness of breath, and dyspnea on exertion.  GI: Denies abdominal pain, constipation, diarrhea, nausea, or vomiting.  GU: Denies dysuria, hematuria, vaginal bleeding, vaginal discharge. Endorses vaginal dryness as above. Musculoskeletal: Denies joint or bone pain.  Neuro: Denies headache or recent falls. Endorses peripheral neuropathy as above. Skin: Denies rash, pruritis, or open wounds.  Breast: Denies any new nodularity, masses, tenderness, nipple changes, or nipple discharge.  Psych: Denies depression, anxiety, insomnia.  Endorses cognitive changes as above.   A 14-point review of systems was completed and was negative, except as noted above.   ONCOLOGY TREATMENT TEAM:  1. Surgeon:  Dr. Barry Dienes at [facility] 2. Medical Oncologist: (previously Dr. Humphrey Rolls), now Dr. Lindi Adie  3. Plastic Surgeon: Dr. Iran Planas     PAST MEDICAL/SURGICAL HISTORY:  Past Medical History  Diagnosis Date  . HSV infection     takes Valtrex daily  . Urinary frequency   . Breast cancer 2014    triple negative  . Nausea     takes Compazine and Zofran as needed  . Anemia     takes Ferrous Sulfate daily  . Depression     takes Cymbalta daily  . Insomnia     takes Ambien nightly as needed  . Hypertension     takes HCTZ daily  . Dizziness     r/t meds  . Neuropathy     takes Lyrica daily and alternates with Gabapentin  .  Joint pain   . Nocturia   . History of blood transfusion     no abnormal reaction noted  . Anxiety   . Vision blurred 02/2013  . Neuropathy of both feet     DUE TO CHEMO PER PATIENT  . Wears glasses   . PONV (postoperative nausea and vomiting)     shaking after anesthesia; had n/v after mastectomy  . Herpes    Past Surgical History  Procedure Laterality Date  . Tubal ligation  2003    LTL  . Portacath placement N/A 07/06/2012    Procedure: INSERTION PORT-A-CATH;  Surgeon: Stark Klein, MD;  Location: [place];  Service: General;  Laterality: N/A;  .  Port-a-cath removal Left 01/12/2013    Procedure: REMOVAL PORT-A-CATH;  Surgeon: Stark Klein, MD;  Location: [place];  Service: General;  Laterality: Left;  . Breast lumpectomy Right 01/12/2013    Procedure: RIGHT SENTINEL LYMPH  NODE BIOPSY;  Surgeon: Stark Klein, MD;  Location: [place];  Service: General;  Laterality: Right;  RIGHT SENTINEL LYMPH NODE BIOPSY  . Colonoscopy    . Total mastectomy Right 03/20/2013    Procedure: RIGHT SKIN SPARING TOTAL MASTECTOMY;  Surgeon: Stark Klein, MD;  Location: [place];  Service: General;  Laterality: Right;  . Breast reconstruction with placement of tissue expander and flex hd (acellular hydrated dermis) Right 03/20/2013    Procedure: IMMEDIATE RIGHT BREAST RECONSTRUCTION WITH PLACEMENT OF TISSUE EXPANDER AND FLEX HD (ACELLULAR HYDRATED DERMIS);  Surgeon: Irene Limbo, MD;  Location: [place];  Service: Plastics;  Laterality: Right;  . Simple mastectomy with axillary sentinel node biopsy Left 07/24/2013    Procedure: SIMPLE MASTECTOMY;  Surgeon: Stark Klein, MD;  Location: [place];  Service: General;  Laterality: Left;  . Breast reconstruction with placement of tissue expander and flex hd (acellular hydrated dermis) Left 07/24/2013    Procedure: BREAST RECONSTRUCTION WITH PLACEMENT OF TISSUE EXPANDER AND FLEX HD (ACELLULAR HYDRATED DERMIS) TO THE LEFT BREAST;  Surgeon: Irene Limbo, MD;  Location: [place];  Service: Plastics;  Laterality: Left;  Marland Kitchen Mastectomy Bilateral 03/20/2013 & June 2015    WITH RECONSRTUCTION          DR Barry Dienes   . Breast reconstruction with placement of tissue expander and flex hd (acellular hydrated dermis) Bilateral 01/07/2014    Procedure: BREAST RECONSTRUCTION WITH REMOVAL  OF TISSUE EXPANDER AND PLACEMENT  OF SILICONE IMPLANTS TO BILATERAL BREAST;  Surgeon: Irene Limbo, MD;  Location: [place];  Service: Plastics;  Laterality: Bilateral;     ALLERGIES:    Allergies  Allergen Reactions  . Lyrica [Pregabalin] Other (See Comments)    Hallucinations and bad dreams     CURRENT MEDICATIONS:   Current outpatient prescriptions:  Marland Kitchen  GABAPENTIN PO, Take 300 mg by mouth at bedtime. , Disp: , Rfl:  .  hydrochlorothiazide (HYDRODIURIL) 25 MG tablet, Take 1 tablet (25 mg total) by mouth daily., Disp: 30 tablet, Rfl: 5 .  HYDROcodone-acetaminophen (NORCO/VICODIN) 5-325 MG per tablet, Take 1-2 tablets by mouth every 4 (four) hours as needed for moderate pain or severe pain., Disp: 50 tablet, Rfl: 0 .  ibuprofen (ADVIL,MOTRIN) 600 MG tablet, Take 600 mg by mouth every 6 (six) hours as needed for moderate pain., Disp: , Rfl:  .  l-methylfolate-B6-B12 (METANX) 3-35-2 MG TABS, Take 1 tablet by mouth daily., Disp: 60 tablet, Rfl: 6 .  magnesium 30 MG tablet, Take 30 mg by mouth daily.,  Disp: , Rfl:  .  Multiple Vitamin (MULTIVITAMIN) tablet, Take 1 tablet by mouth daily., Disp: , Rfl:  .  TRAMADOL HCL PO, Take by mouth as needed., Disp: , Rfl:  .  valACYclovir (VALTREX) 500 MG tablet, Take 1 tablet (500 mg total) by mouth daily., Disp: 30 tablet, Rfl: 3 .  vitamin E 100 UNIT capsule, Take 100 Units by mouth daily., Disp: , Rfl:  .  B Complex-C (SUPER B COMPLEX PO), Take 1 tablet by mouth daily. , Disp: , Rfl:  .  DULoxetine HCl (CYMBALTA PO), Take by mouth as directed., Disp: , Rfl:  .  ferrous sulfate 325 (65 FE) MG tablet, Take 325 mg by mouth daily with breakfast., Disp: , Rfl:  .  [DISCONTINUED] prochlorperazine (COMPAZINE) 10 MG tablet, Take 1 tablet (10 mg total) by mouth every 6 (six) hours as needed (Nausea or vomiting)., Disp: 30 tablet, Rfl: 1    ONCOLOGIC FAMILY HISTORY:  Family History  Problem Relation Age of Onset  . Heart disease Mother   . Aneurysm Mother   . Hypertension Father   . Prostate cancer Father 65  . Hypertension Sister   . Ovarian cancer Paternal Grandmother     died in her 38s  . Stomach cancer Maternal Grandmother 43   . Cancer Cousin     paternal cousin with an unknown form of cancer    GENETIC TESTING:  OncoGeneDx: Breast/Ovarian Cancer Panel on 07/05/12 revealed 3 "variants of unknown significance". -MLH1: c.2213G>A (p.Gly738Glu) classified as unknown significance. -BRCA1: c.5348T>C (p.Met1783Thr) classified as likely benign.  -NBN: c.283G>A (p.Asp95Asn) classified as likely benign.   *All genes tested include: ATM, BARD1, BRCA1, BRCA2, BRIP1, CDH1, CHEK2, EPCAM, FANCC, MLH1, MSH2, MSH6, NBN, PALB2, PMS2, PTEN, RAD51C, RAD51D, STK11, TP53, & XRCC2.    SOCIAL HISTORY:  Jaime Murillo is currently engaged and lives with her fiance in [place], [place].  They have plans to be married in the next few weeks. She is very excited about her upcoming wedding.  She was married once before and divorced.  She has 2 sons, the youngest is 53 years old and in the 8th grade. She also has 1 granddaughter. She is currently unemployed, but has a job interview at [employer] to work at the front desk there.  She is very much looking forward to this potential employment opportunity.  She is also a Ship broker at [employer]. She hopes to work for a Advertising copywriter as her way of "giving back."   She is currently enrolled in the [place] Normal") support group series here at the cancer center.  She denies any current or history of tobacco, alcohol, or illicit drug use.     PHYSICAL EXAMINATION:  Vital Signs:   Filed Vitals:   06/03/14 1730  BP: 176/102  Pulse: 75  Temp: 98.5 F (36.9 C)  Resp: 16   General: Well-nourished, well-appearing female in no acute distress.  She is unaccompanied today.   HEENT: Head is atraumatic and normocephalic.  Pupils equal and reactive to light and accomodation. Conjunctivae clear without exudate.  Sclerae anicteric. Oral mucosa is pink, moist, and intact without lesions.  Partial dental plates in place in areas of missing teeth.  Oropharynx is pink without lesions or erythema.  Lymph: No supraclavicular, infraclavicular, or axillary lymphadenopathy noted on palpation.  Breasts: No nodularity, masses, skin changes, or discharge noted in bilateral breasts.  Bilateral implants palpated and normal. Surgical scars healed.  Cardiovascular:  Regular rate and rhythm without murmurs, rubs, or gallops. Respiratory: Clear to auscultation bilaterally. Chest expansion symmetric without accessory muscle use on inspiration or expiration.  GI: Abdomen soft and round. No tenderness to palpation. Bowel sounds normoactive in 4 quadrants. No hepatosplenomegaly.   GU: Deferred.  Musculoskeletal: Muscle strength 5/5 in all extremities.  Full ROM noted in all extremities.  Neuro: No focal deficits. Steady gait.  Psych: Mood and affect normal and appropriate for situation.  Appropriately tearful at times during visit today.  Extremities: No edema, cyanosis, or clubbing.  Skin: Warm and dry. No open lesions noted.   LABORATORY DATA:  None for this visit.  DIAGNOSTIC IMAGING:  None for this visit.     ASSESSMENT AND PLAN:   1. History of breast cancer:  Ms. Caffee will follow-up with her medical oncologist, Dr. Lindi Adie in 06/2014 with history and physical exam per surveillance protocol.  A comprehensive survivorship care plan and treatment summary was reviewed with the patient today detailing her breast cancer diagnosis, treatment course, potential late/long-term effects of treatment, appropriate follow-up care with recommendations for the future, and patient education resources.  A copy of this summary, along with a letter will be sent to the patient's primary care provider via mail/fax/In Basket message after today's visit.  Jaime Murillo is welcome to return to the Survivorship Clinic in the future, as needed; no follow-up will be scheduled at this time.   2.  Cognitive changes/"chemo brain": This is the biggest complaint for Jaime Murillo,  particularly as a current Ship broker.  She became emotional when talking to me about her struggles with studying and hard work as a Ship broker not being reflected in her grades because of her memory and concentration.  Her symptoms started after chemotherapy and have not improved (last chemo was 12/04/12).  Fatigue can certainly contribute to the brain's ability to function well, but for Jaime Murillo, I think it is reasonable for her to be evaluated by a Neuropsychiatry specialist.  I have placed that referral to Neuropsych Rehab today.  I offered her support and encouragement as well.  She does have Medicaid insurance and is concerned about the potential cost of this specialist evaluation.  I have placed a comment in the referral order to signify that the patient should be called and made aware of the potential charges for this evaluation prior to scheduling.  She is looking forward to the possibility of evaluation and treatment by the Neuropsychiatrist to potentially help improve her symptoms.   3. Vaginal dryness/Urinary stress incontinence/Sexual health & intimacy: Vaginal dryness is an extremely common complaint of women who are both post-menopausal and breast cancer survivors.  We discussed, at length, the role that estrogen plays in a female's body with regards to the vaginal mucosa, as well as how all sex hormones play a role in libido.  We reviewed the normal process of menopause by which sex hormones naturally decrease with time.  For Jaime Murillo, the chemotherapy likely contributed to her transition into menopause.  We discussed OTC vaginal moisturizers and gave her instructions that she should use the moisturizers about 3x/week to help with the dryness.  I also encouraged her to continue to use a lubricant with intercourse to help prevent vaginal tearing and pain. I also encouraged her to attend the "Healthy Pelvic Floor & Intimacy" class which is facilitated by our Pelvic Floor Rehab Physical Therapist, where  the patient can learn exercises, tips, and strategies to help combat many of these symptoms.  If she needs a formal referral to pelvic floor rehab in the future, I am happy to place that order for her.  4. Peripheral neuropathy: Her complaint of peripheral neuropathy is likely the result of receiving taxane chemotherapy (Paclitaxel).  Often the neuropathy improves with time after completing chemotherapy, but for some patients the symptoms can be permanent.  Jaime Murillo currently sees a neurologist, who has prescribed gabapentin to help with her symptoms.  However, the patient states that she does not tolerate the medication well and has "bad thoughts" where it affects her mood.  She reserves the gabapentin for severe discomfort because of this side effect.  She states that while the neuropathy does cause her pain (particularly at night), she does feel like it is much improved from when she was receiving treatment, has largely no symptoms during the day and does not impact her activities of daily living.   5. Hot flashes: The hot flashes are likely due to menopause.  She reports that they are largely well-managed with an herbal supplement she takes when she needs it (she doesn't know the name but said it was a "[place] herb."  We discussed behavioral changes that may help reduce both the severity and frequency of hot flashes, like avoiding hot/spicy foods, caffeine, alcohol, dressing in layers, etc.   We reviewed that the gabapentin could offer her dual benefit of helping with both the neuropathy and the hot flashes, but again she is concerned about the side effects of the medication which makes her resistant to take the gabapentin.   We discussed the use of Effexor XR in the treatment of hot flashes. Again, Jaime Murillo is concerned about the potential side effects of that medication as well and prefers to manage it on her own with prn gabapentin and her herbal supplement Overall, the hot flashes are manageable by  her report and do not require further intervention at this time.   6. Hypertension: Her BP was quite elevated in clinic today at 170s/100s.  She denies any chest pain, shortness of breath, or headaches.  She reported that she had not taken her anti-hypertensive medications in about 4 days because she had not been able to pick up the medications from the pharmacy.  She tells me that she does have the medications now and will resume them.  She will follow-up with her PCP as clinically indicated by them for her hypertension.   7. Bone health:  Given Jaime Murillo's age and history of breast cancer, she is at risk for bone demineralization.  She will be eligible for a DEXA scan at age 63, or earlier if clinically indicated by her PCP.  In the meantime, she was encouraged to increase her consumption of foods rich in calcium, as well as increase her weight-bearing activities.  She was given education on specific activities to promote bone health.   8. Cancer screening:  Due to Jaime Murillo's history and her age, she should receive screening for skin cancers, colon cancer, and gynecologic cancers.  The information and recommendations are listed on the patient's comprehensive care plan/treatment summary and were reviewed in detail with the patient.    9. Health maintenance and wellness promotion: Jaime Murillo was encouraged to consume 5-7 servings of fruits and vegetables per day. She was also encouraged to engage in moderate to vigorous exercise for 30 minutes per day most days of the week. Jaime Murillo is currently enrolled in the [employer] fitness program for cancer survivors, which has helped  improve her fatigue and mood.  I offered her encouragement and support for enrolling in this fitness program as it offers great benefit to both her physical and emotional health. She was also instructed to limit her alcohol consumption and continue to abstain from tobacco use.   10. Support services/counseling: It is not  uncommon for this period of the patient's cancer care trajectory to be one of many emotions and stressors.  She is currently enrolled in the [facility] ("Finding Your New Normal") support group series here at the cancer center that helps survivors explore the challenges of becoming a cancer survivor.  Jaime Murillo was encouraged to continue to take advantage of our many support services programs, support groups, and/or counseling in coping with her new life as a cancer survivor after completing anti-cancer treatment.  She was offered support today through active listening and expressive supportive counseling.  She was given information regarding our available services and encouraged to contact me with any questions or for help enrolling in any of our support group/programs.    A total of 65 minutes of face-to-face time was spent with this patient with greater than 50% of that time in counseling and care-coordination.   Mike Craze, NP Survivorship Program [facility] [phone]   Note: PRIMARY CARE PROVIDER Elyn Peers, MD [phone]

## 2014-06-06 ENCOUNTER — Telehealth: Payer: Self-pay | Admitting: Adult Health

## 2014-06-06 NOTE — Telephone Encounter (Signed)
Ms. Quilling contacted me asking for my help in completing a medical clearance form so that she may participate in the next session of the [employer] fitness program through the [facility].    She will send a scanned copy of that document to me via email.  When I receive it, I will sign it and fax it to Elaina Hoops, our [employer] coordinator.  Ms. Zaragoza thanked me for my help with this.  She is looking forward to participating in the program in early June.   Mike Craze, NP [place] [phone]

## 2014-06-07 ENCOUNTER — Telehealth: Payer: Self-pay | Admitting: Adult Health

## 2014-06-07 NOTE — Telephone Encounter (Signed)
I called Jaime Murillo to follow-up with her regarding the neurorehab referral I ordered for her. Unfortunately, the practice that does this testing does not accept Medicaid and [name] would have to pay out of pocket for the evaluation and treatment.  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  She did tell me that she has a pending evaluation with a psychiatrist at her school, [employer], who may be able to help her some.  She is looking forward to that as a possibility.    I also let her know that I did not receive the email of the medical clearance form that she would like for me to sign for her to participate in the [place] program.  She is going to try to resend it to me.   I encouraged [name] that if her insurance situation changes at any time in the future, I could always order the formal neuropsychiatric evaluation for her again.  She thanked me for my call and I encouraged her to let me know if there was anything else I could help with in the future. She knows to call me for any questions or concerns.   Mike Craze, NP [place] [phone]

## 2014-06-13 ENCOUNTER — Other Ambulatory Visit (HOSPITAL_COMMUNITY)
Admission: RE | Admit: 2014-06-13 | Discharge: 2014-06-13 | Disposition: A | Discharge status: Home / Self Care | Payer: Medicaid Other | Source: Ambulatory Visit | Attending: Gynecology | Admitting: Gynecology

## 2014-06-13 ENCOUNTER — Encounter: Payer: Self-pay | Admitting: Gynecology

## 2014-06-13 ENCOUNTER — Ambulatory Visit (INDEPENDENT_AMBULATORY_CARE_PROVIDER_SITE_OTHER): Payer: Medicaid Other | Admitting: Gynecology

## 2014-06-13 VITALS — BP 136/90 | Ht 65.0 in | Wt 121.0 lb

## 2014-06-13 DIAGNOSIS — D251 Intramural leiomyoma of uterus: Secondary | ICD-10-CM

## 2014-06-13 DIAGNOSIS — Z780 Asymptomatic menopausal state: Secondary | ICD-10-CM

## 2014-06-13 DIAGNOSIS — Z1151 Encounter for screening for human papillomavirus (HPV): Secondary | ICD-10-CM | POA: Diagnosis present

## 2014-06-13 DIAGNOSIS — Z853 Personal history of malignant neoplasm of breast: Secondary | ICD-10-CM

## 2014-06-13 DIAGNOSIS — Z0000 Encounter for general adult medical examination without abnormal findings: Secondary | ICD-10-CM | POA: Diagnosis not present

## 2014-06-13 DIAGNOSIS — Z01419 Encounter for gynecological examination (general) (routine) without abnormal findings: Secondary | ICD-10-CM

## 2014-06-13 NOTE — Patient Instructions (Signed)
Bone Densitometry Bone densitometry is a special X-ray that measures your bone density and can be used to help predict your risk of bone fractures. This test is used to determine bone mineral content and density to diagnose osteoporosis. Osteoporosis is the loss of bone that may cause the bone to become weak. Osteoporosis commonly occurs in women entering menopause. However, it may be found in men and in people with other diseases. PREPARATION FOR TEST No preparation necessary. WHO SHOULD BE TESTED?  All women older than 63.  Postmenopausal women (50 to 76) with risk factors for osteoporosis.  People with a previous fracture caused by normal activities.  People with a small body frame (less than 127 poundsor a body mass index [BMI] of less than 21).  People who have a parent with a hip fracture or history of osteoporosis.  People who smoke.  People who have rheumatoid arthritis.  Anyone who engages in excessive alcohol use (more than 3 drinks most days).  Women who experience early menopause. WHEN SHOULD YOU BE RETESTED? Current guidelines suggest that you should wait at least 2 years before doing a bone density test again if your first test was normal.Recent studies indicated that women with normal bone density may be able to wait a few years before needing to repeat a bone density test. You should discuss this with your caregiver.  NORMAL FINDINGS   Normal: less than standard deviation below normal (greater than -1).  Osteopenia: 1 to 2.5 standard deviations below normal (-1 to -2.5).  Osteoporosis: greater than 2.5 standard deviations below normal (less than -2.5). Test results are reported as a "T score" and a "Z score."The T score is a number that compares your bone density with the bone density of healthy, young women.The Z score is a number that compares your bone density with the scores of women who are the same age, gender, and race.  Ranges for normal findings may vary  among different laboratories and hospitals. You should always check with your doctor after having lab work or other tests done to discuss the meaning of your test results and whether your values are considered within normal limits. MEANING OF TEST  Your caregiver will go over the test results with you and discuss the importance and meaning of your results, as well as treatment options and the need for additional tests if necessary. OBTAINING THE TEST RESULTS It is your responsibility to obtain your test results. Ask the lab or department performing the test when and how you will get your results. Document Released: 02/03/2004 Document Revised: 04/05/2011 Document Reviewed: 02/25/2010 [facility] Patient Information 2015 [place], [place]. This information is not intended to replace advice given to you by your health care provider. Make sure you discuss any questions you have with your health care provider. Shingles Vaccine What You Need to Know WHAT IS SHINGLES?  Shingles is a painful skin rash, often with blisters. It is also called Herpes Zoster or just Zoster.  A shingles rash usually appears on one side of the face or body and lasts from 2 to 4 weeks. Its main symptom is pain, which can be quite severe. Other symptoms of shingles can include fever, headache, chills, and upset stomach. Very rarely, a shingles infection can lead to pneumonia, hearing problems, blindness, brain inflammation (encephalitis), or death.  For about 1 person in 5, severe pain can continue even after the rash clears up. This is called post-herpetic neuralgia.  Shingles is caused by the Varicella Zoster virus.  This is the same virus that causes chickenpox. Only someone who has had a case of chickenpox or rarely, has gotten chickenpox vaccine, can get shingles. The virus stays in your body. It can reappear many years later to cause a case of shingles.  You cannot catch shingles from another person with shingles. However, a  person who has never had chickenpox (or chickenpox vaccine) could get chickenpox from someone with shingles. This is not very common.  Shingles is far more common in people 5 and older than in younger people. It is also more common in people whose immune systems are weakened because of a disease such as cancer or drugs such as steroids or chemotherapy.  At least 1 million people get shingles per year in the [place]. SHINGLES VACCINE  A vaccine for shingles was licensed in 0160. In clinical trials, the vaccine reduced the risk of shingles by 50%. It can also reduce the pain in people who still get shingles after being vaccinated.  A single dose of shingles vaccine is recommended for adults 37 years of age and older. SOME PEOPLE SHOULD NOT GET SHINGLES VACCINE OR SHOULD WAIT A person should not get shingles vaccine if he or she:  Has ever had a life-threatening allergic reaction to gelatin, the antibiotic neomycin, or any other component of shingles vaccine. Tell your caregiver if you have any severe allergies.  Has a weakened immune system because of current:  AIDS or another disease that affects the immune system.  Treatment with drugs that affect the immune system, such as prolonged use of high-dose steroids.  Cancer treatment, such as radiation or chemotherapy.  Cancer affecting the bone marrow or lymphatic system, such as leukemia or lymphoma.  Is pregnant, or might be pregnant. Women should not become pregnant until at least 4 weeks after getting shingles vaccine. Someone with a minor illness, such as a cold, may be vaccinated. Anyone with a moderate or severe acute illness should usually wait until he or she recovers before getting the vaccine. This includes anyone with a temperature of 101.3 F (38 C) or higher. WHAT ARE THE RISKS FROM SHINGLES VACCINE?  A vaccine, like any medicine, could possibly cause serious problems, such as severe allergic reactions. However, the risk  of a vaccine causing serious harm, or death, is extremely small.  No serious problems have been identified with shingles vaccine. Mild Problems  Redness, soreness, swelling, or itching at the site of the injection (about 1 person in 3).  Headache (about 1 person in 36). Like all vaccines, shingles vaccine is being closely monitored for unusual or severe problems. WHAT IF THERE IS A MODERATE OR SEVERE REACTION? What should I look for? Any unusual condition, such as a severe allergic reaction or a high fever. If a severe allergic reaction occurred, it would be within a few minutes to an hour after the shot. Signs of a serious allergic reaction can include difficulty breathing, weakness, hoarseness or wheezing, a fast heartbeat, hives, dizziness, paleness, or swelling of the throat. What should I do?  Call your caregiver, or get the person to a caregiver right away.  Tell the caregiver what happened, the date and time it happened, and when the vaccination was given.  Ask the caregiver to report the reaction by filing a Vaccine Adverse Event Reporting System (VAERS) form. Or, you can file this report through the VAERS web site at www.vaers.SamedayNews.es or by calling 3377835711. VAERS does not provide medical advice. HOW CAN I LEARN  MORE?  Ask your caregiver. He or she can give you the vaccine package insert or suggest other sources of information.  Contact the Centers for Disease Control and Prevention (CDC):  Call 276-559-6235 (1-800-CDC-INFO).  Visit the CDC website at http://hunter.com/ CDC Shingles Vaccine VIS (10/31/07) Document Released: 11/08/2005 Document Revised: 04/05/2011 Document Reviewed: 05/03/2012 [facility] Patient Information 2015 [place]. This information is not intended to replace advice given to you by your health care provider. Make sure you discuss any questions you have with your health care provider.

## 2014-06-13 NOTE — Progress Notes (Signed)
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 Tumor was grade 3 was made. Patient underwent right mastectomy on 04/17/2013. Patient also has completed chemotherapy. Patientunderwent sentinel node biopsy with Dr. Barry Dienes on 01/12/13 to evaluate whether or not she was a candidate for immediate reconstruction following bilateral mastectomies. Patient has completed bilateral nipple sparing mastectomy and reconstruction in December 2015.Her colonoscopy was normal in 2015. Her PCP is Dr. Criss Rosales who is been monitor her hyperlipidemia and hypertension. Patient received the Tdap vaccine last year. Patient has not had a bone density study as of yet. Last year for proximally 6 months she took [employer] for vasomotor symptoms which helped and she is no longer on any treatment for that. She scheduled to see the oncologist in June of this year. Patient reports no vaginal bleeding.  Patient with history of fibroid uterus last year had an ultrasound which demonstrated the following: Uterus measures 7.5 x 5.3 x 4.1 cm with an endometrial stripe of 2.3 mm. Patient with 4 small intramural and subserosal fibroids the largest one measuring 12 x 11 mm. Some fluid was noted in the cul-de-sac. No apparent adnexal masses. Right and left ovary were otherwise normal.  In 2015 her Pap smear was normal but high-risk HPV was detected and further testing for HPV 16, 18 and 45 were not detected patient had a normal colonoscopy in 2015 and she's on a 5 year recall.    Jaime Murillo 1960-10-27 [id number]   History:    54 y.o.  for annual gyn exam with no complaints today. In 2014 doing an annual exam   Past medical history,surgical history, family history and social history were all reviewed and documented in the EPIC chart.  Gynecologic History No LMP  recorded. Patient is not currently having periods (Reason: Chemotherapy). Contraception: post menopausal status BTL Last Pap: 2015. Results were: normal Last mammogram: 2014. Results were: normal  Obstetric History OB History  Gravida Para Term Preterm AB SAB TAB Ectopic Multiple Living  _0 # Outcome Date GA Lbr Len/2nd Weight Sex Delivery Anes PTL Lv  3 SAB           2 Term     M Vag-Spont  N Y  1 Term     M Vag-Spont  N Y       ROS: A ROS was performed and pertinent positives and negatives are included in the history.  GENERAL: No fevers or chills. HEENT: No change in vision, no earache, sore throat or sinus congestion. NECK: No pain or stiffness. CARDIOVASCULAR: No chest pain or pressure. No palpitations. PULMONARY: No shortness of breath, cough or wheeze. GASTROINTESTINAL: No abdominal pain, nausea, vomiting or diarrhea, melena or bright red blood per rectum. GENITOURINARY: No urinary frequency, urgency, hesitancy or dysuria. MUSCULOSKELETAL: No joint or muscle pain, no back pain, no recent trauma. DERMATOLOGIC: No rash, no itching, no lesions. ENDOCRINE: No polyuria, polydipsia, no heat or cold intolerance. No recent change in weight. HEMATOLOGICAL: No anemia or easy bruising or bleeding. NEUROLOGIC: No headache, seizures, numbness, tingling or weakness. PSYCHIATRIC: No depression, no loss of interest in normal activity or change in sleep pattern.     Exam: chaperone present  BP 136/90 mmHg  Ht _1  (1.651 m)  Wt 121 lb (54.885 kg)  BMI 20.14 kg/m2  Body mass index is 20.14 kg/(m^2).  General appearance : Well developed well nourished female. No acute distress HEENT: Eyes: no retinal hemorrhage or exudates,  Neck supple, trachea midline, no carotid bruits, no thyroidmegaly Lungs: Clear to auscultation, no rhonchi or wheezes, or rib retractions  Heart: Regular rate and rhythm, no murmurs or gallops Breast:Examined in sitting and supine position were  symmetrical in appearance, no palpable masses or tenderness,  no skin retraction, no nipple inversion, no nipple discharge, no skin discoloration, no axillary or supraclavicular lymphadenopathy Abdomen: no palpable masses or tenderness, no rebound or guarding Extremities: no edema or skin discoloration or tenderness  Pelvic:  Bartholin, Urethra, Skene Glands: Within normal limits             Vagina: No gross lesions or discharge  Cervix: No gross lesions or discharge  Uterus  retroverted, irregular shaped 8 weeks size posterior fibroid  Adnexa  Without masses or tenderness  Anus and perineum  normal   Rectovaginal  normal sphincter tone without palpated masses or tenderness             Hemoccult cards provided     Assessment/Plan:  54 y.o. 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 Patient has informed me that she was tested for the BRCA one BRCA2 gene mutation and was negative. Patient will return back for follow-up ultrasound on her fibroid and to assess her endometrial stripe and her ovaries in detail. She'll also schedule a bone density study. Pap smear with HPV screening was done today. Her PCP we'll be doing her blood work. We discussed importance of calcium vitamin D for osteoporosis prevention.   [name] MD, 9:22 AM 06/13/2014

## 2014-06-17 LAB — CYTOLOGY - PAP

## 2014-06-28 ENCOUNTER — Encounter: Payer: Self-pay | Admitting: Adult Health

## 2014-06-28 ENCOUNTER — Telehealth: Payer: Self-pay | Admitting: Hematology and Oncology

## 2014-06-28 ENCOUNTER — Ambulatory Visit (HOSPITAL_BASED_OUTPATIENT_CLINIC_OR_DEPARTMENT_OTHER): Payer: Medicaid Other | Admitting: Hematology and Oncology

## 2014-06-28 VITALS — BP 139/89 | HR 74 | Temp 98.2°F | Resp 18 | Ht 65.0 in | Wt 119.1 lb

## 2014-06-28 DIAGNOSIS — Z853 Personal history of malignant neoplasm of breast: Secondary | ICD-10-CM

## 2014-06-28 DIAGNOSIS — C50311 Malignant neoplasm of lower-inner quadrant of right female breast: Secondary | ICD-10-CM

## 2014-06-28 DIAGNOSIS — G629 Polyneuropathy, unspecified: Secondary | ICD-10-CM | POA: Diagnosis not present

## 2014-06-28 DIAGNOSIS — D701 Agranulocytosis secondary to cancer chemotherapy: Secondary | ICD-10-CM

## 2014-06-28 NOTE — Progress Notes (Signed)
A birthday card was mailed to the patient today on behalf of the Survivorship Program at [facility].   Gretchen Dawson, NP Survivorship Program [facility] [phone]  

## 2014-06-28 NOTE — Telephone Encounter (Signed)
email to gretchen for one year survivorship   anne

## 2014-06-28 NOTE — Progress Notes (Signed)
Patient Care Team: Lucianne Lei, MD as PCP - General (Family Medicine) Stark Klein, MD as Consulting Physician (General Surgery) Holley Bouche, NP as Nurse Practitioner (Nurse Practitioner)  DIAGNOSIS: Cancer of lower-inner quadrant of RIGHT female breast   Staging form: Breast, AJCC 7th Edition     Clinical: Stage IA (T1c, N0, cM0) - Unsigned       Staging comments: Staged at breast conference 6.4.14      Pathologic stage from 03/20/2013: yT0 - Unsigned   SUMMARY OF ONCOLOGIC HISTORY:   Cancer of lower-inner quadrant of RIGHT female breast   06/20/2012 Initial Biopsy Right breast needle core biopsy (3 o'clock): Grade 3, IDC & DCIS.  ER- (0%), PR- (0%), HER2- (ratio 1.26). Ki67 95%.    06/20/2012 Breast US Right breast with slightly irregular oval hypoechoic mass measuring 7 x 6 x 17 mm at 3 o'clock postion. No mammographic evidence of breast malignancy bilaterally. Palpable area of concern at 12 o'clock, but no suspicious findings at this location on [place].    06/23/2012 Initial Diagnosis Cancer of lower-inner quadrant of RIGHT female breast   06/26/2012 Breast MRI Right breast w/clumped NME at 2 o'clock position measuring 0.8 x 1.6 x 2.2 cm located anterior/superior to known cancer. Also 6 mm ill-defined ovoid mass posterior lateral to bx-proven ca, lying superficial pect. muscle. Likely multifocal disease.    07/03/2012 Echocardiogram Pre-treatment EF: 55-60%   07/03/2012 Imaging CT c/a/p: No evidence of metastatic disease.    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  Also heteroxygous for 2 likely benign variants on BRCA1 and NBN.  Remainder of genes in GeneDX panel negative.    07/12/2012 Procedure Right breast needle core biopsy: Benign breast tissue with focal pseudoangiomatous stromal hyperplasic (PASH). No evidence of malignancy.    07/17/2012 - 08/28/2012 Neo-Adjuvant Chemotherapy Adriamycin &  Cytoxan x 4 cycles completed.    09/11/2012 - 10/23/2012 Neo-Adjuvant Chemotherapy Taxol/Carboplatin weekly x 7 cycles (stopped due to peripheral neuropathy; changed to Gemcitabine/Carbo for additional 3 cycles).   11/06/2012 - 12/04/2012 Neo-Adjuvant Chemotherapy Gemcitabine/Carboplatin x 3 cycles completed.    11/24/2012 Breast MRI Previous right breast multifocal malignancy with complete imaging resolution.  Stable 6 mm hemangioma or cyst in liver.    01/12/2013 Surgery Right axillary SLNB ([place]):  3 SLNs negative.  Port also removed on this date.    03/20/2013 Definitive Surgery Right skin-sparing total mastectomy Barry Dienes) with immediate reconstruction: No atypia or malignancy identified.  Benign skin & nipple. s/p neoadjuvant therapy.  Represents a complete response. Placement of tissue expanders.    03/20/2013 Pathologic Stage ypT0; No evidence of cancer.    07/24/2013 Surgery LEFT simple mastectomy with immediate reconstruction; showed ALH and fibroadenoma. Placement of tissue expanders.    01/07/2014 Surgery Bilateral breast reconstruction ([place]): Removal of bilateral tissue expanders and placement of bilateral silicone breast implants.    06/03/2014 Survivorship Survivorship Care Plan given to patient and reveiwed with her during in-person visit.     CHIEF COMPLIANT: follow-up of breast cancer  INTERVAL HISTORY: Jaime Murillo is a 54 year old with above-mentioned history of right breast cancer treated with 1 chemotherapy followed by mastectomy. She underwent reconstruction of her breast on the contralateral side as well. She is doing well without any major problems. She complains of a managed memory for which she is seeing a psychologist at her school. She is also participating in [facility] program. She is doing the [facility] [employer] [facility] exercise  therapy program as well.  REVIEW OF SYSTEMS:   Constitutional: Denies fevers, chills or abnormal weight loss Eyes: Denies blurriness of  vision Ears, nose, mouth, throat, and face: Denies mucositis or sore throat Respiratory: Denies cough, dyspnea or wheezes Cardiovascular: Denies palpitation, chest discomfort or lower extremity swelling Gastrointestinal:  Denies nausea, heartburn or change in bowel habits Skin: Denies abnormal skin rashes Lymphatics: Denies new lymphadenopathy or easy bruising Neurological:Denies numbness, tingling or new weaknesses Behavioral/Psych: Mood is stable, no new changes  Breast:  denies any pain or lumps or nodules in either breasts All other systems were reviewed with the patient and are negative.  I have reviewed the past medical history, past surgical history, social history and family history with the patient and they are unchanged from previous note.  ALLERGIES:  is allergic to lyrica.  MEDICATIONS:  Current Outpatient Prescriptions  Medication Sig Dispense Refill  . B Complex-C (SUPER B COMPLEX PO) Take 1 tablet by mouth daily.     . DULoxetine HCl (CYMBALTA PO) Take by mouth as directed.    . ferrous sulfate 325 (65 FE) MG tablet Take 325 mg by mouth daily with breakfast.    . GABAPENTIN PO Take 300 mg by mouth at bedtime.     . hydrochlorothiazide (HYDRODIURIL) 25 MG tablet Take 1 tablet (25 mg total) by mouth daily. 30 tablet 5  . HYDROcodone-acetaminophen (NORCO/VICODIN) 5-325 MG per tablet Take 1-2 tablets by mouth every 4 (four) hours as needed for moderate pain or severe pain. 50 tablet 0  . ibuprofen (ADVIL,MOTRIN) 600 MG tablet Take 600 mg by mouth every 6 (six) hours as needed for moderate pain.    Marland Kitchen l-methylfolate-B6-B12 (METANX) 3-35-2 MG TABS Take 1 tablet by mouth daily. 60 tablet 6  . magnesium 30 MG tablet Take 30 mg by mouth daily.    . Multiple Vitamin (MULTIVITAMIN) tablet Take 1 tablet by mouth daily.    . TRAMADOL HCL PO Take by mouth as needed.    . valACYclovir (VALTREX) 500 MG tablet Take 1 tablet (500 mg total) by mouth daily. 30 tablet 3  . vitamin E 100 UNIT  capsule Take 100 Units by mouth daily.    . [DISCONTINUED] prochlorperazine (COMPAZINE) 10 MG tablet Take 1 tablet (10 mg total) by mouth every 6 (six) hours as needed (Nausea or vomiting). 30 tablet 1   No current facility-administered medications for this visit.    PHYSICAL EXAMINATION: ECOG PERFORMANCE STATUS: 1 - Symptomatic but completely ambulatory  Filed Vitals:   06/28/14 1205  BP: 139/89  Pulse: 74  Temp: 98.2 F (36.8 C)  Resp: 18   Filed Weights   06/28/14 1205  Weight: 119 lb 1.6 oz (54.023 kg)    GENERAL:alert, no distress and comfortable SKIN: skin color, texture, turgor are normal, no rashes or significant lesions EYES: normal, Conjunctiva are pink and non-injected, sclera clear OROPHARYNX:no exudate, no erythema and lips, buccal mucosa, and tongue normal  NECK: supple, thyroid normal size, non-tender, without nodularity LYMPH:  no palpable lymphadenopathy in the cervical, axillary or inguinal LUNGS: clear to auscultation and percussion with normal breathing effort HEART: regular rate & rhythm and no murmurs and no lower extremity edema ABDOMEN:abdomen soft, non-tender and normal bowel sounds Musculoskeletal:no cyanosis of digits and no clubbing  NEURO: alert & oriented x 3 with fluent speech, no focal motor/sensory deficits BREAST:axillary lymph node examination did not reveal any enlarged lymph nodes  LABORATORY DATA:  I have reviewed the data as listed  Chemistry      Component Value Date/Time   NA 139 12/31/2013 0850   NA 138 03/28/2013 1107   K 3.9 12/31/2013 0850   K 3.4* 03/28/2013 1107   CL 100 12/31/2013 0850   CL 104 07/17/2012 1357   CO2 27 12/31/2013 0850   CO2 31* 03/28/2013 1107   BUN 16 12/31/2013 0850   BUN 14.7 03/28/2013 1107   CREATININE 0.70 12/31/2013 0850   CREATININE 0.9 03/28/2013 1107   CREATININE 0.70 06/06/2012 0853      Component Value Date/Time   CALCIUM 9.6 12/31/2013 0850   CALCIUM 9.6 03/28/2013 1107   ALKPHOS 52  03/28/2013 1107   ALKPHOS 35* 06/06/2012 0853   AST 25 03/28/2013 1107   AST 14 06/06/2012 0853   ALT 14 03/28/2013 1107   ALT 9 06/06/2012 0853   BILITOT 0.40 03/28/2013 1107   BILITOT 0.8 06/06/2012 0853       Lab Results  Component Value Date   WBC 2.9* 12/31/2013   HGB 12.1 12/31/2013   HCT 35.7* 12/31/2013   MCV 90.2 12/31/2013   PLT 222 12/31/2013   NEUTROABS 1.2* 07/19/2013   ASSESSMENT & PLAN:  Cancer of lower-inner quadrant of RIGHT female breast Right breast invasive ductal carcinoma triple negative Ki-67 95% status post new adjuvant chemotherapy with Adriamycin and Cytoxan dose dense 4 started 07/17/2012 followed by Taxol and carboplatin 7 stopped early due to neuropathy H to Gemzar Carbo 11/06/2012 3, status post right mastectomy pathologic complete response followed by breast reconstruction December 2015  Breast Cancer Surveillance: 1. Breast exam 06/28/2014: normal axillary exam 2. 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   The patient has a good understanding of the overall plan. she agrees with it. she will call with any problems that may develop before the next visit here.   Rulon Eisenmenger, MD     I am wondering well to the distal 1 mL the 1:00 patient trying to make them, so I guess yes summary needs to figure out thank you

## 2014-06-28 NOTE — Assessment & Plan Note (Signed)
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 Breast exam 06/28/2014: Normal 2. Mammogram : No role of mammograms and she had bilateral mastectomy  Neuropathy on gabapentin and Cymbalta  Return to clinic once a year for follow-up with survivorship clinic

## 2014-07-02 ENCOUNTER — Telehealth: Payer: Self-pay | Admitting: *Deleted

## 2014-07-02 NOTE — Telephone Encounter (Signed)
Received office notes from [facility], sent to scan.

## 2014-07-10 ENCOUNTER — Ambulatory Visit (INDEPENDENT_AMBULATORY_CARE_PROVIDER_SITE_OTHER): Payer: Medicaid Other | Admitting: Gynecology

## 2014-07-10 ENCOUNTER — Encounter: Payer: Self-pay | Admitting: Gynecology

## 2014-07-10 ENCOUNTER — Ambulatory Visit (INDEPENDENT_AMBULATORY_CARE_PROVIDER_SITE_OTHER): Payer: Medicaid Other

## 2014-07-10 VITALS — BP 124/80 | Ht 65.0 in | Wt 119.0 lb

## 2014-07-10 DIAGNOSIS — Z853 Personal history of malignant neoplasm of breast: Secondary | ICD-10-CM

## 2014-07-10 DIAGNOSIS — R936 Abnormal findings on diagnostic imaging of limbs: Secondary | ICD-10-CM

## 2014-07-10 DIAGNOSIS — D251 Intramural leiomyoma of uterus: Secondary | ICD-10-CM

## 2014-07-10 DIAGNOSIS — D259 Leiomyoma of uterus, unspecified: Secondary | ICD-10-CM

## 2014-07-10 NOTE — Progress Notes (Signed)
   Patient presented to the office today to discuss her ultrasound. Patient with history of T1 N0 MX triple negative invasive ductal carcinoma of the right breast in 2014 who has completed her chemotherapy and mastectomy and reconstruction. Patient reports negative BRCA1 and BRCA2 gene mutation analysis. Patient asymptomatic today. Patient had complete gynecological exam on the 19th 2016 whereby she had a normal Pap smear. Patient with past history of small uterine fibroid.  Ultrasound: Uterus measures 7.8 x 5.9 x 3.3 cm with endometrial 1.8 mm. Patient with 4 small fibroids largest one measuring 11 x 9 mm essentially unchanged from ultrasound 2015. Some fluid in the cul-de-sac was noted right ovary with thickened will wall involuting follicle 11 x 7 mm was noted. Left ovary was normal. No apparent adnexal masses.  Assessment/plan: Patient with past history of breast cancer status post bilateral mastectomy and chemotherapy been followed by oncologist. Stable fibroid uterus and normal ovaries. Patient to return next year for annual exam or when necessary.

## 2014-07-15 ENCOUNTER — Telehealth: Payer: Self-pay | Admitting: Hematology and Oncology

## 2014-07-15 NOTE — Telephone Encounter (Signed)
Faxed pt medical records to  [facility]

## 2014-07-17 ENCOUNTER — Other Ambulatory Visit: Payer: Self-pay | Admitting: Gynecology

## 2014-07-24 ENCOUNTER — Telehealth: Payer: Self-pay | Admitting: *Deleted

## 2014-07-24 NOTE — Telephone Encounter (Signed)
Hilario Quarry called saying she is a Print production planner with [employer] and she would like to speak to someone on the Survivorship Team for this patient.  Her call back is [phone].  Will route this to Mike Craze our NP in charge of the Survivorship Program.

## 2014-07-25 ENCOUNTER — Telehealth: Payer: Self-pay | Admitting: Adult Health

## 2014-07-25 NOTE — Telephone Encounter (Addendum)
I received a message from Jaime Beyene Short, RN that Jaime Murillo, a therapist in the Psychology Department at [facility] had some questions regarding Jaime Murillo's case and wanted to speak with me.   I called and spoke with Jaime Murillo directly. (phone number: [phone]. [phone])   She has been working with Jaime Murillo on several of her concerns and wanted to touch base with me regarding a proposed intervention for the patient to help manage her peripheral neuropathy.  In this program, they help patients improve their perceptions of pain. They do this by working with her on gait analysis, deep breathing, relaxation, imagery exercises, etc.   I am hopeful that Jaime Murillo's participation in their peripheral neuropathy program will offer her some benefit for both her physical and emotional concerns. I encouraged Jaime Murillo to contact me with any further questions or concerns. I thanked her for caring for this patient and for reaching out to me to discuss her case.   Mike Craze, NP [place] [phone]

## 2014-07-25 NOTE — Telephone Encounter (Deleted)
Previous noted signed before completion.   I am hopeful that Jaime Murillo's participation in their peripheral neuropathy program will offer her some benefit for both her physical and emotional concerns.  I encouraged [name] to contact me with any further questions or concerns.  I thanked her for caring for this patient and for reaching out to me to discuss her case.    Mike Craze, NP [place] [phone]

## 2014-08-01 ENCOUNTER — Ambulatory Visit (INDEPENDENT_AMBULATORY_CARE_PROVIDER_SITE_OTHER): Payer: Medicaid Other

## 2014-08-01 ENCOUNTER — Other Ambulatory Visit: Payer: Self-pay | Admitting: Gynecology

## 2014-08-01 DIAGNOSIS — Z13820 Encounter for screening for osteoporosis: Secondary | ICD-10-CM

## 2014-08-01 DIAGNOSIS — Z780 Asymptomatic menopausal state: Secondary | ICD-10-CM | POA: Diagnosis not present

## 2014-08-12 ENCOUNTER — Telehealth: Payer: Self-pay | Admitting: Adult Health

## 2014-08-12 ENCOUNTER — Encounter: Payer: Self-pay | Admitting: Adult Health

## 2014-08-12 NOTE — Telephone Encounter (Signed)
I received a call from [name] asking for my help in getting some things completed for her to participate in an exercise/physical activity study (designed for cancer survivors) at [facility] [employer].  [name] asked me to write a medical release for her to participate in physical activity, as well as have her medical records released.   I gave her the number for our medical records/HIM department and she will contact them regarding her medical record release. I will compose her medical release for exercise letter and she will pick that up this week (a copy will also be placed in her chart).  [name] knows to call me with any other questions or concerns.   Mike Craze, NP [place] [phone]

## 2014-08-28 ENCOUNTER — Telehealth: Payer: Self-pay

## 2014-08-28 NOTE — Telephone Encounter (Signed)
Authorization to particiapte in [facility] study faxed to [place].  Sent to scan.

## 2014-09-02 ENCOUNTER — Telehealth: Payer: Self-pay | Admitting: Adult Health

## 2014-09-02 ENCOUNTER — Encounter: Payer: Self-pay | Admitting: Adult Health

## 2014-09-02 NOTE — Telephone Encounter (Signed)
I received a voicemail from Ms. Coral regarding her interest in participating in an additional fitness study through [facility] and she needs another activity clearance letter signed in order for her to participate.  Ms. Niday stated in her voicemail that she would be volunteering for the [facility] on Friday mornings and she would like to pick up her paperwork at that time.  I will draft a letter for her and leave it with the [place] folks for Ms. Kopke to pick up at her convenience.    Mike Craze, NP [place] [phone]

## 2014-09-06 ENCOUNTER — Ambulatory Visit: Payer: Medicaid Other | Admitting: Nurse Practitioner

## 2014-09-11 ENCOUNTER — Ambulatory Visit: Payer: Medicaid Other | Admitting: Nurse Practitioner

## 2014-09-12 ENCOUNTER — Ambulatory Visit: Payer: Medicaid Other | Admitting: Nurse Practitioner

## 2014-09-13 ENCOUNTER — Encounter: Payer: Self-pay | Admitting: Nurse Practitioner

## 2014-09-14 ENCOUNTER — Emergency Department (HOSPITAL_COMMUNITY)
Admission: EM | Admit: 2014-09-14 | Discharge: 2014-09-14 | Disposition: A | Discharge status: Home / Self Care | Payer: No Typology Code available for payment source | Attending: Emergency Medicine | Admitting: Emergency Medicine

## 2014-09-14 ENCOUNTER — Emergency Department (HOSPITAL_COMMUNITY): Payer: No Typology Code available for payment source

## 2014-09-14 ENCOUNTER — Encounter (HOSPITAL_COMMUNITY): Payer: Self-pay | Admitting: Emergency Medicine

## 2014-09-14 DIAGNOSIS — S20219A Contusion of unspecified front wall of thorax, initial encounter: Secondary | ICD-10-CM | POA: Insufficient documentation

## 2014-09-14 DIAGNOSIS — Y92410 Unspecified street and highway as the place of occurrence of the external cause: Secondary | ICD-10-CM | POA: Diagnosis not present

## 2014-09-14 DIAGNOSIS — S0083XA Contusion of other part of head, initial encounter: Secondary | ICD-10-CM | POA: Diagnosis not present

## 2014-09-14 DIAGNOSIS — Z79899 Other long term (current) drug therapy: Secondary | ICD-10-CM | POA: Diagnosis not present

## 2014-09-14 DIAGNOSIS — Z853 Personal history of malignant neoplasm of breast: Secondary | ICD-10-CM | POA: Insufficient documentation

## 2014-09-14 DIAGNOSIS — S199XXA Unspecified injury of neck, initial encounter: Secondary | ICD-10-CM | POA: Insufficient documentation

## 2014-09-14 DIAGNOSIS — Z8619 Personal history of other infectious and parasitic diseases: Secondary | ICD-10-CM | POA: Diagnosis not present

## 2014-09-14 DIAGNOSIS — S299XXA Unspecified injury of thorax, initial encounter: Secondary | ICD-10-CM | POA: Diagnosis present

## 2014-09-14 DIAGNOSIS — F419 Anxiety disorder, unspecified: Secondary | ICD-10-CM | POA: Insufficient documentation

## 2014-09-14 DIAGNOSIS — Y999 Unspecified external cause status: Secondary | ICD-10-CM | POA: Diagnosis not present

## 2014-09-14 DIAGNOSIS — G4700 Insomnia, unspecified: Secondary | ICD-10-CM | POA: Insufficient documentation

## 2014-09-14 DIAGNOSIS — D649 Anemia, unspecified: Secondary | ICD-10-CM | POA: Diagnosis not present

## 2014-09-14 DIAGNOSIS — Y939 Activity, unspecified: Secondary | ICD-10-CM | POA: Insufficient documentation

## 2014-09-14 DIAGNOSIS — F329 Major depressive disorder, single episode, unspecified: Secondary | ICD-10-CM | POA: Insufficient documentation

## 2014-09-14 DIAGNOSIS — I10 Essential (primary) hypertension: Secondary | ICD-10-CM | POA: Insufficient documentation

## 2014-09-14 DIAGNOSIS — R079 Chest pain, unspecified: Secondary | ICD-10-CM

## 2014-09-14 LAB — CBC WITH DIFFERENTIAL/PLATELET
BASOS ABS: 0 10*3/uL (ref 0.0–0.1)
Basophils Relative: 0 % (ref 0–1)
Eosinophils Absolute: 0 10*3/uL (ref 0.0–0.7)
Eosinophils Relative: 0 % (ref 0–5)
HEMATOCRIT: 35.2 % — AB (ref 36.0–46.0)
Hemoglobin: 12.1 g/dL (ref 12.0–15.0)
LYMPHS PCT: 43 % (ref 12–46)
Lymphs Abs: 1 10*3/uL (ref 0.7–4.0)
MCH: 31.8 pg (ref 26.0–34.0)
MCHC: 34.4 g/dL (ref 30.0–36.0)
MCV: 92.4 fL (ref 78.0–100.0)
MONO ABS: 0.2 10*3/uL (ref 0.1–1.0)
Monocytes Relative: 9 % (ref 3–12)
NEUTROS ABS: 1.1 10*3/uL — AB (ref 1.7–7.7)
Neutrophils Relative %: 48 % (ref 43–77)
Platelets: 217 10*3/uL (ref 150–400)
RBC: 3.81 MIL/uL — AB (ref 3.87–5.11)
RDW: 12.9 % (ref 11.5–15.5)
WBC: 2.3 10*3/uL — ABNORMAL LOW (ref 4.0–10.5)

## 2014-09-14 LAB — I-STAT CHEM 8, ED
BUN: 21 mg/dL — ABNORMAL HIGH (ref 6–20)
CHLORIDE: 102 mmol/L (ref 101–111)
CREATININE: 0.9 mg/dL (ref 0.44–1.00)
Calcium, Ion: 1.24 mmol/L — ABNORMAL HIGH (ref 1.12–1.23)
GLUCOSE: 87 mg/dL (ref 65–99)
HCT: 38 % (ref 36.0–46.0)
Hemoglobin: 12.9 g/dL (ref 12.0–15.0)
POTASSIUM: 3.7 mmol/L (ref 3.5–5.1)
Sodium: 141 mmol/L (ref 135–145)
TCO2: 27 mmol/L (ref 0–100)

## 2014-09-14 LAB — I-STAT TROPONIN, ED: Troponin i, poc: 0 ng/mL (ref 0.00–0.08)

## 2014-09-14 MED ORDER — HYDROCODONE-ACETAMINOPHEN 5-325 MG PO TABS
1.0000 | ORAL_TABLET | Freq: Once | ORAL | Status: DC
  Filled 2014-09-14: qty 1

## 2014-09-14 MED ORDER — METHOCARBAMOL 500 MG PO TABS: 500.0000 mg | ORAL_TABLET | Freq: Two times a day (BID) | ORAL | Status: DC
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
## 2014-09-14 NOTE — ED Notes (Signed)
Patient transported to X-ray 
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
## 2014-09-14 NOTE — ED Notes (Signed)
Pt refused discharge vitals 

## 2014-10-22 DIAGNOSIS — Z0289 Encounter for other administrative examinations: Secondary | ICD-10-CM

## 2014-10-29 ENCOUNTER — Ambulatory Visit: Payer: Medicaid Other | Admitting: Nurse Practitioner

## 2014-10-29 ENCOUNTER — Telehealth: Payer: Self-pay | Admitting: *Deleted
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
## 2014-10-30 ENCOUNTER — Encounter: Payer: Self-pay | Admitting: Nurse Practitioner

## 2014-11-19 ENCOUNTER — Encounter: Payer: Self-pay | Admitting: Nurse Practitioner

## 2014-11-19 ENCOUNTER — Ambulatory Visit (INDEPENDENT_AMBULATORY_CARE_PROVIDER_SITE_OTHER): Payer: Medicaid Other | Admitting: Nurse Practitioner

## 2014-11-19 VITALS — BP 136/85 | HR 74 | Ht 63.0 in | Wt 119.8 lb

## 2014-11-19 DIAGNOSIS — G6289 Other specified polyneuropathies: Secondary | ICD-10-CM | POA: Diagnosis not present

## 2014-11-19 MED ORDER — GABAPENTIN 300 MG PO CAPS: 600.0000 mg | ORAL_CAPSULE | Freq: Every day | ORAL | Status: AC

## 2014-11-19 MED ORDER — L-METHYLFOLATE-B6-B12 3-35-2 MG PO TABS: 1.0000 | ORAL_TABLET | Freq: Every day | ORAL | Status: DC

## 2014-11-19 NOTE — Progress Notes (Signed)
[facility]  PATIENT: Jaime Murillo DOB: 08-02-1960   REASON FOR VISIT: follow up peripheral neuropathy, increased pain HISTORY FROM:patient    HISTORY OF PRESENT ILLNESS:[name], 54 year old female returns for followup she was initially evaluated for peripheral neuropathy by Dr. Krista Blue 02/08/2013. She was last seen in the office 02/28/14.  History of carcinoma of the right breast and received chemotherapy consisting of Adriamycin,Cytoxan followed by weekly Taxol and carboplatinum, planned for a total of 12 weeks, beginning 09/11/2012. After the third treatment she began to notice her finger tips with numbness and tingling and dropping things. Later she began to notice the same symptoms in her toes and feet. She was originally given gabapentin without improvement then Lyrica without improvement and then Cymbalta 60 mg daily. She titrated off Cymbalta after last visit due to side effects. She is now on gabapentin 300 at night. She has had less  side effects to gabapentin than  Cymbalta and Lyrica. Sometimes she does not take the medication. She had left breast surgery in December 2015 She has recently been placed on OxyContin after her reconstruction surgery. She takes Ambien at night to help her sleep. She is attempting to go to school but she is having trouble focusing and now taking one class at a time. She returns for reevaluation.   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 Tumor was grade 3.  She began to receive chemotherapy, initially consistent of Adriamycin,Cytoxan followed by weekly Taxol and carboplatinum, planned for a total of 12 weeks, beginning 09/11/2012.  After third treatment in early Sep 2014, she  began to notice fingertips paresthesia, numbness tingling, drop things out of her hands, could not feel things with her fingertips, later also noticed bilateral toes, sole paresthesia, instead of getting the planned 12 treatment, she only got 6 treatment because progressive worsening bilateral feet and hands paresthesia.  She also described generalized weakness, nausea, legs out underneath her, has fell few times. She denies significant low back pain, no bowel bladder incontinence  She was given gabapentin 300 mg in the morning, 400 mg at nighttime, with no significant improvement  She is planning on to have double mastectomy followed by reconstruction surgery in February 2015 right axillary lymph node biopsy was negative,    REVIEW OF SYSTEMS: Full 14 system review of systems performed and notable only for those listed, all others are neg:  Constitutional: neg  Cardiovascular: neg Ear/Nose/Throat: neg  Skin: neg Eyes: neg Respiratory: Cough Gastroitestinal: neg  Hematology/Lymphatic: neg  Endocrine: neg Musculoskeletal: Muscle cramps Allergy/Immunology: neg Neurological: Headache, numbness, occasional dizziness Psychiatric: Depression and anxiety Sleep : Restless legs   ALLERGIES: Allergies  Allergen Reactions  . Lyrica [Pregabalin] Other (See Comments)    Hallucinations and bad dreams    HOME MEDICATIONS: Outpatient Prescriptions Prior to Visit  Medication Sig Dispense Refill  . B Complex-C (SUPER B COMPLEX PO) Take 1 tablet by mouth daily.     . DULoxetine HCl (CYMBALTA PO) Take by mouth as directed.    . ferrous sulfate 325 (65 FE) MG tablet Take 325 mg by mouth daily with breakfast.    . GABAPENTIN PO Take 300 mg by mouth at bedtime.     . hydrochlorothiazide (HYDRODIURIL) 25 MG tablet Take 1 tablet (  25 mg total) by mouth daily. 30 tablet 5  . HYDROcodone-acetaminophen (NORCO/VICODIN) 5-325 MG per tablet Take 1-2 tablets by mouth every 4 (four) hours as needed for  moderate pain or severe pain. 50 tablet 0  . ibuprofen (ADVIL,MOTRIN) 600 MG tablet Take 600 mg by mouth every 6 (six) hours as needed for moderate pain.    Marland Kitchen l-methylfolate-B6-B12 (METANX) 3-35-2 MG TABS Take 1 tablet by mouth daily. 60 tablet 6  . magnesium 30 MG tablet Take 30 mg by mouth daily.    . methocarbamol (ROBAXIN) 500 MG tablet Take 1 tablet (500 mg total) by mouth 2 (two) times daily. (Patient taking differently: Take 500 mg by mouth 2 (two) times daily as needed. ) 20 tablet 0  . Multiple Vitamin (MULTIVITAMIN) tablet Take 1 tablet by mouth daily.    . TRAMADOL HCL PO Take by mouth as needed.    . valACYclovir (VALTREX) 500 MG tablet TAKE 1 TABLET BY MOUTH ONCE DAILY 30 tablet 3  . vitamin E 100 UNIT capsule Take 100 Units by mouth daily.     No facility-administered medications prior to visit.    PAST MEDICAL HISTORY: Past Medical History  Diagnosis Date  . HSV infection     takes Valtrex daily  . Urinary frequency   . Breast cancer ([facility]) 2014    triple negative  . Nausea     takes Compazine and Zofran as needed  . Anemia     takes Ferrous Sulfate daily  . Depression     takes Cymbalta daily  . Insomnia     takes Ambien nightly as needed  . Hypertension     takes HCTZ daily  . Dizziness     r/t meds  . Neuropathy ([facility])     takes Lyrica daily and alternates with Gabapentin  . Joint pain   . Nocturia   . History of blood transfusion     no abnormal reaction noted  . Anxiety   . Vision blurred 02/2013  . Neuropathy of both feet ([facility])     DUE TO CHEMO PER PATIENT  . Wears glasses   . PONV (postoperative nausea and vomiting)     shaking after anesthesia; had n/v after mastectomy  . Herpes     PAST SURGICAL HISTORY: Past Surgical History  Procedure Laterality Date  . Tubal ligation  2003    LTL  . Portacath placement N/A 07/06/2012    Procedure: INSERTION PORT-A-CATH;  Surgeon: Stark Klein, MD;  Location: [facility];  Service: General;  Laterality: N/A;  .  Port-a-cath removal Left 01/12/2013    Procedure: REMOVAL PORT-A-CATH;  Surgeon: Stark Klein, MD;  Location: [facility];  Service: General;  Laterality: Left;  . Breast lumpectomy Right 01/12/2013    Procedure: RIGHT SENTINEL LYMPH  NODE BIOPSY;  Surgeon: Stark Klein, MD;  Location: [facility];  Service: General;  Laterality: Right;  RIGHT SENTINEL LYMPH NODE BIOPSY  . Colonoscopy    . Total mastectomy Right 03/20/2013    Procedure: RIGHT SKIN SPARING TOTAL MASTECTOMY;  Surgeon: Stark Klein, MD;  Location: [facility];  Service: General;  Laterality: Right;  . Breast reconstruction with placement of tissue expander and flex hd (acellular hydrated dermis) Right 03/20/2013    Procedure: IMMEDIATE RIGHT BREAST RECONSTRUCTION WITH PLACEMENT OF TISSUE EXPANDER AND FLEX HD (ACELLULAR HYDRATED DERMIS);  Surgeon: Irene Limbo, MD;  Location: [facility];  Service: Plastics;  Laterality: Right;  . Simple mastectomy with axillary sentinel  node biopsy Left 07/24/2013    Procedure: SIMPLE MASTECTOMY;  Surgeon: Stark Klein, MD;  Location: [facility];  Service: General;  Laterality: Left;  . Breast reconstruction with placement of tissue expander and flex hd (acellular hydrated dermis) Left 07/24/2013    Procedure: BREAST RECONSTRUCTION WITH PLACEMENT OF TISSUE EXPANDER AND FLEX HD (ACELLULAR HYDRATED DERMIS) TO THE LEFT BREAST;  Surgeon: Irene Limbo, MD;  Location: [facility];  Service: Plastics;  Laterality: Left;  Marland Kitchen Mastectomy Bilateral 03/20/2013 & June 2015    WITH RECONSRTUCTION          DR Barry Dienes   . Breast reconstruction with placement of tissue expander and flex hd (acellular hydrated dermis) Bilateral 01/07/2014    Procedure: BREAST RECONSTRUCTION WITH REMOVAL  OF TISSUE EXPANDER AND PLACEMENT  OF SILICONE IMPLANTS TO BILATERAL BREAST;  Surgeon: Irene Limbo, MD;  Location: [facility];  Service: Plastics;  Laterality: Bilateral;    FAMILY  HISTORY: Family History  Problem Relation Age of Onset  . Heart disease Mother   . Aneurysm Mother   . Hypertension Father   . Prostate cancer Father 55  . Hypertension Sister   . Ovarian cancer Paternal Grandmother     died in her 105s  . Stomach cancer Maternal Grandmother 43  . Cancer Cousin     paternal cousin with an unknown form of cancer    SOCIAL HISTORY: Social History   Social History  . Marital Status: Single    Spouse Name: N/A  . Number of Children: 2  . Years of Education: 13   Occupational History  .      Disabled   Social History Main Topics  . Smoking status: Never Smoker   . Smokeless tobacco: Never Used  . Alcohol Use: No  . Drug Use: No  . Sexual Activity: Yes    Birth Control/ Protection: Surgical     Comment: BTL   Other Topics Concern  . Not on file   Social History Narrative   Patient lives at home with her son she is single.   Disabled.   Education- One year of college.   Right handed.   Caffeine- One cup daily.     PHYSICAL EXAM  Filed Vitals:   11/19/14 0727  BP: 136/85  Pulse: 74  Height: 5\' 3"  (1.6 m)  Weight: 119 lb 12.8 oz (54.341 kg)   Body mass index is 21.23 kg/(m^2). Generalized: Well developed, in no acute distress   Head: normocephalic and atraumatic,. Oropharynx benign  Neck: Supple, no carotid bruits  Cardiac: Regular rate rhythm, no murmur  Musculoskeletal: No deformity   Neurological examination   Mentation: Alert oriented to time, place, history taking. Attention span and concentration appropriate. Recent and remote memory intact. Follows all commands speech and language fluent.   Cranial nerve II-XII: Fundoscopic exam reveals sharp disc margins.Pupils were equal round reactive to light extraocular movements were full, visual field were full on confrontational test. Facial sensation and strength were normal. hearing was intact to finger rubbing bilaterally. Uvula tongue midline. head turning and  shoulder shrug were normal and symmetric.Tongue protrusion into cheek strength was normal. Motor: normal bulk and tone, full strength in the BUE, BLE, fine finger movements normal, no pronator drift. No focal weakness Sensory: decreased pinprick to midshin, mild decreased vibratory to ankles, intact to fine touch and proprioception  Coordination: finger-nose-finger, heel-to-shin bilaterally, no dysmetria Reflexes: Brachioradialis 2/2, biceps 2/2, triceps 2/2, patellar 2/2, Achilles 2/2, plantar responses were  flexor bilaterally. Gait and Station: Rising up from seated position without assistance, mild antalgic gait moderate stride, good arm swing, smooth turning, able to perform tiptoe, and heel walking without difficulty. Tandem gait is unsteady. No assistive device  DIAGNOSTIC DATA (LABS, IMAGING, TESTING) - I reviewed patient records, labs, notes, testing and imaging myself where available.  Lab Results  Component Value Date   WBC 2.3* 09/14/2014   HGB 12.9 09/14/2014   HCT 38.0 09/14/2014   MCV 92.4 09/14/2014   PLT 217 09/14/2014      Component Value Date/Time   NA 141 09/14/2014 0833   NA 138 03/28/2013 1107   K 3.7 09/14/2014 0833   K 3.4* 03/28/2013 1107   CL 102 09/14/2014 0833   CL 104 07/17/2012 1357   CO2 27 12/31/2013 0850   CO2 31* 03/28/2013 1107   GLUCOSE 87 09/14/2014 0833   GLUCOSE 71 03/28/2013 1107   GLUCOSE 93 07/17/2012 1357   BUN 21* 09/14/2014 0833   BUN 14.7 03/28/2013 1107   CREATININE 0.90 09/14/2014 0833   CREATININE 0.9 03/28/2013 1107   CREATININE 0.70 06/06/2012 0853   CALCIUM 9.6 12/31/2013 0850   CALCIUM 9.6 03/28/2013 1107   PROT 7.3 03/28/2013 1107   PROT 6.9 06/06/2012 0853   ALBUMIN 3.7 03/28/2013 1107   ALBUMIN 3.9 06/06/2012 0853   AST 25 03/28/2013 1107   AST 14 06/06/2012 0853   ALT 14 03/28/2013 1107   ALT 9 06/06/2012 0853   ALKPHOS 52 03/28/2013 1107   ALKPHOS 35* 06/06/2012 0853   BILITOT 0.40 03/28/2013 1107   BILITOT  0.8 06/06/2012 0853   GFRNONAA >90 12/31/2013 0850   GFRAA >90 12/31/2013 0850       ASSESSMENT AND PLAN  54 y.o. year old female   past medical history of HSV infection; Urinary frequency; Breast cancer (2014);  Anemia; Depression; Insomnia; Hypertension; Dizziness; Neuropathy; Joint pain; Nocturia;  Anxiety; Neuropathy of both feet; and Herpes. here to follow-up for her neuropathy which is unstable and causing more pain at present.   Increase  gabapentin to 600mg   at night, will refill Metanx twice daily to continue will refill Follow-up in 6 to 65months Esaw Knippel Carolyn Anetta Olvera, [facility], [facility], [facility] [address], [place], [place] [address] [phone]

## 2014-11-19 NOTE — Patient Instructions (Signed)
Increase gabapentin to 2 caps at night will refill Continue Metanx at current dose will refill Follow-up in 6-8 months

## 2014-11-20 NOTE — Progress Notes (Signed)
I have reviewed and agreed above plan. 

## 2014-12-27 ENCOUNTER — Other Ambulatory Visit: Payer: Self-pay | Admitting: Gynecology

## 2015-02-06 ENCOUNTER — Encounter: Payer: Self-pay | Admitting: Adult Health

## 2015-02-06 ENCOUNTER — Telehealth: Payer: Self-pay | Admitting: Adult Health

## 2015-02-06 NOTE — Telephone Encounter (Signed)
I received a call and email from the patient stating that she needs a letter sent to [facility] on [address] stating that she is "compliant with treatment and ready to return for spring 2017."  I let Nekayla know that I would be happy to provide this letter for her.  I encouraged her to call me with any other questions or concerns.   Mike Craze, NP [place] [phone]

## 2015-02-06 NOTE — Progress Notes (Signed)
Letter drafted for patient and was fax & mailed today.  I returned Nayda's email to let her know that the letter had been both faxed and mailed.  See copy of letter in scanned documents.  Contact info for recipient is below.   Mike Craze, NP [place] [phone]   ---------------   [address] [place], [place]  [address]  Shipping Address:  [address] [place],  [address]  [phone] Voice [phone] Fax

## 2015-03-04 ENCOUNTER — Other Ambulatory Visit: Payer: Self-pay | Admitting: Gynecology

## 2015-03-04 MED FILL — VALACYCLOVIR HCL 500 MG TAB: 500 | 30 days supply | Qty: 30 | Fill #0

## 2015-03-27 ENCOUNTER — Telehealth: Payer: Self-pay | Admitting: Adult Health

## 2015-03-27 MED FILL — HYDROCHLOROTHIAZIDE 25 MG T: 25 | 30 days supply | Qty: 30 | Fill #0

## 2015-03-27 MED FILL — VALACYCLOVIR HCL 500 MG TAB: 500 | 30 days supply | Qty: 30 | Fill #1

## 2015-03-27 NOTE — Telephone Encounter (Signed)
I received a call from [name] asking if she can have another copy of her Breast Survivorship Care Plan, as she is expected to appear for her disability court hearing next week and needs an accurate account of her cancer treatment history.  I verified that all she needed was a copy of her care plan and she confirmed that she did not need any additional information or documentation from me at this time.  I let her know that I would print a copy of her care plan and leave it with Dawn & Varney Biles, our Breast Nurse Navigators and the patient could pick it up from them tomorrow when she comes to the cancer center to volunteer.  [name] voiced understanding and appreciation. I encouraged her to call me with any other questions or concerns.   Jaime Craze, NP [place] [phone]

## 2015-05-07 ENCOUNTER — Encounter: Payer: Self-pay | Admitting: Nurse Practitioner

## 2015-06-18 MED FILL — HYDROCHLOROTHIAZIDE 25 MG T: 25 | 30 days supply | Qty: 30 | Fill #1

## 2015-06-18 MED FILL — VALACYCLOVIR HCL 500 MG TAB: 500 | 30 days supply | Qty: 30 | Fill #2

## 2015-06-19 ENCOUNTER — Ambulatory Visit: Payer: Medicaid Other | Admitting: Nurse Practitioner

## 2015-06-25 ENCOUNTER — Encounter: Payer: Self-pay | Admitting: Adult Health

## 2015-06-25 NOTE — Progress Notes (Signed)
A birthday card was mailed to the patient today on behalf of the Survivorship Program at [facility].   Tylah Mancillas, NP Survivorship Program [facility] [phone]  

## 2015-06-26 ENCOUNTER — Telehealth: Payer: Self-pay | Admitting: *Deleted

## 2015-06-26 DIAGNOSIS — Z853 Personal history of malignant neoplasm of breast: Secondary | ICD-10-CM

## 2015-06-26 NOTE — Telephone Encounter (Signed)
Correct will need ultrasound at time of annual exam

## 2015-06-26 NOTE — Telephone Encounter (Signed)
Jaime Murillo informed with the below, order placed.

## 2015-06-26 NOTE — Telephone Encounter (Signed)
Pt has annual schedule on 08/06/15 states you told her she will need ultrasound every year due to history of breast cancer? Last ultrasound in 6/16. Per note on 07/10/14 "Patient with past history of breast cancer status post bilateral mastectomy and chemotherapy been followed by oncologist. Stable fibroid uterus and normal ovaries. Patient to return next year for annual exam or when necessary. Please advise

## 2015-06-30 ENCOUNTER — Telehealth: Payer: Self-pay | Admitting: Nurse Practitioner

## 2015-06-30 NOTE — Telephone Encounter (Signed)
Scheduled LTS visit for 6/12. Not able to reach patient or leave message - voice mail full. Schedule mailed. Patient also mychart active.

## 2015-07-07 ENCOUNTER — Encounter: Payer: Medicaid Other | Admitting: Nurse Practitioner

## 2015-07-09 ENCOUNTER — Encounter: Payer: Medicaid Other | Admitting: Nurse Practitioner

## 2015-07-11 ENCOUNTER — Encounter: Payer: Medicaid Other | Admitting: Nurse Practitioner

## 2015-07-11 ENCOUNTER — Telehealth: Payer: Self-pay | Admitting: Nurse Practitioner

## 2015-07-11 ENCOUNTER — Encounter: Payer: Self-pay | Admitting: Nurse Practitioner

## 2015-07-11 ENCOUNTER — Ambulatory Visit (HOSPITAL_BASED_OUTPATIENT_CLINIC_OR_DEPARTMENT_OTHER): Payer: Medicare Other | Admitting: Nurse Practitioner

## 2015-07-11 VITALS — BP 134/81 | HR 69 | Temp 97.4°F | Resp 18 | Ht 63.0 in | Wt 121.7 lb

## 2015-07-11 DIAGNOSIS — C50311 Malignant neoplasm of lower-inner quadrant of right female breast: Secondary | ICD-10-CM

## 2015-07-11 DIAGNOSIS — Z853 Personal history of malignant neoplasm of breast: Secondary | ICD-10-CM | POA: Diagnosis present

## 2015-07-11 DIAGNOSIS — G629 Polyneuropathy, unspecified: Secondary | ICD-10-CM | POA: Diagnosis not present

## 2015-07-11 DIAGNOSIS — R52 Pain, unspecified: Secondary | ICD-10-CM

## 2015-07-11 NOTE — Patient Instructions (Signed)
Thank you for coming in today!  As we discussed, please continue to perform your self breast exam and report any changes. If you note any new symptoms (please see below), be sure to notify us ASAP.   We'll have you return in 6 month's time for your next appointment or sooner if you have any problems. Please be sure to stop by scheduling on your way out to make that appointment. Looking forward to working with you in the future!  Let us know if you have any questions!  Symptoms to Watch for and Report to Your Provider  . Changes along your reconstruction . New or unusual pain that seems unrelated to an injury and does not go away, including back pain or bone pain . Weight loss without trying/intending . Unexplained bleeding . A rash or allergic reaction, such as swelling, severe itching or wheezing . Chills or fevers . Persistent headaches . Shortness of breath or difficulty breathing . Bloody stools or blood in your urine . Nausea, vomiting, diarrhea, loss of appetite, or trouble swallowing . A cough that does not go away . Abdominal pain . Swelling in your arms or legs . Fractures . Hot flashes or other menopausal symptoms . Any other signs mentioned by your doctor or nurse or any unusual symptoms                 that you just can't explain   NOTE: Just because you have certain symptoms, it doesn't mean the cancer has come back or you have a new cancer. Symptoms can be due to other problems that need to be addressed.  It is important to watch for these symptoms and report them to your provider so you can be medically evaluated for any of these concerns!     Living a Life of Wellness After Cancer:  *Note: Please consult your health care provider before using any medications, supplements, over-the-counter products, or other interventions.  Also, please consult your primary care provider before you begin any lifestyle program (diet, exercise, etc.).  Your safety is our top priority and we want  to make sure you continue to live a long and healthy life!    Healthy Lifestyle Recommendations  As a cancer survivor, it is important develop a lifelong commitment to a healthy lifestyle. A healthy lifestyle can prevent cancer from returning as well as prevent other diseases like heart disease, diabetes and high blood pressure.  These are some things that you can do to have a healthy lifestyle:  Marland Kitchen Maintain a healthy weight.  . Exercise daily per your doctor's orders. . Eat a balanced diet high in fruits, vegetables, bran, and fiber. Limit intake of red meat      and processed foods.  . Limit how much alcohol you consume, if at all. Ali Lowe regular bone mineral density testing for osteoporosis.  . Talk to your doctor about cardiovascular disease or "heart disease" screening. . Stop smoking (if you smoke). . Know your family history. . Be mindful of your emotional, social, and spiritual needs. . Meet regularly with a Primary Care Provider (PCP). Find a PCP if you do not             already have one. . Talk to your doctor about regular cancer screening including screening for colon           cancer, GYN cancers, and skin cancer.

## 2015-07-11 NOTE — Progress Notes (Signed)
CLINIC:  Cancer Survivorship   REASON FOR VISIT:  Routine follow-up post-treatment for history of breast cancer.  BRIEF ONCOLOGIC HISTORY:    Cancer of lower-inner quadrant of RIGHT female breast   06/20/2012 Initial Biopsy Right breast needle core biopsy (3 o'clock): Grade 3, IDC & DCIS.  ER- (0%), PR- (0%), HER2- (ratio 1.26). Ki67 95%.    06/20/2012 Breast US Right breast with slightly irregular oval hypoechoic mass measuring 7 x 6 x 17 mm at 3 o'clock postion. No mammographic evidence of breast malignancy bilaterally. Palpable area of concern at 12 o'clock, but no suspicious findings at this location on [place].    06/23/2012 Initial Diagnosis Cancer of lower-inner quadrant of RIGHT female breast   06/26/2012 Breast MRI Right breast w/clumped NME at 2 o'clock position measuring 0.8 x 1.6 x 2.2 cm located anterior/superior to known cancer. Also 6 mm ill-defined ovoid mass posterior lateral to bx-proven ca, lying superficial pect. muscle. Likely multifocal disease.    07/03/2012 Echocardiogram Pre-treatment EF: 55-60%   07/03/2012 Imaging CT c/a/p: No evidence of metastatic disease.    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  Also heteroxygous for 2 likely benign variants on BRCA1 and NBN.  Remainder of genes in GeneDX panel negative.    07/12/2012 Procedure Right breast needle core biopsy: Benign breast tissue with focal pseudoangiomatous stromal hyperplasic (PASH). No evidence of malignancy.    07/17/2012 - 08/28/2012 Neo-Adjuvant Chemotherapy Adriamycin & Cytoxan x 4 cycles completed.    09/11/2012 - 10/23/2012 Neo-Adjuvant Chemotherapy Taxol/Carboplatin weekly x 7 cycles (stopped due to peripheral neuropathy; changed to Gemcitabine/Carbo for additional 3 cycles).   11/06/2012 - 12/04/2012 Neo-Adjuvant Chemotherapy Gemcitabine/Carboplatin x 3 cycles completed.    11/24/2012 Breast MRI Previous right breast  multifocal malignancy with complete imaging resolution.  Stable 6 mm hemangioma or cyst in liver.    01/12/2013 Surgery Right axillary SLNB ([place]):  3 SLNs negative.  Port also removed on this date.    03/20/2013 Definitive Surgery Right skin-sparing total mastectomy Barry Dienes) with immediate reconstruction: No atypia or malignancy identified.  Benign skin & nipple. s/p neoadjuvant therapy.  Represents a complete response. Placement of tissue expanders.    03/20/2013 Pathologic Stage ypT0; No evidence of cancer.    07/24/2013 Surgery LEFT simple mastectomy with immediate reconstruction; showed ALH and fibroadenoma. Placement of tissue expanders.    01/07/2014 Surgery Bilateral breast reconstruction ([place]): Removal of bilateral tissue expanders and placement of bilateral silicone breast implants.    06/03/2014 Survivorship Survivorship Care Plan given to patient and reveiwed with her during in-person visit.     INTERVAL HISTORY:  Ms. Hirt presents to the Survivorship Clinic today for ongoing follow up regarding her history of breast cancer. Overall, Ms. Bossard reports feeling doing well since her last visit. She continues her classes at [employer] and working at [employer] twice a week.  She has been working towards volunteering with the [employer] and is very excited to "give back" to others going through treatment.  She has not noticed any change within her breast. She will see Dr. Iran Planas this fall.   She denies any headache, cough, shortness of breath, or bone pain. Her neuropathy continues and it is no worse.  The gabapentin does help relieve it, however, it makes her sleepy.  She noticed the neuropathy more commonly in the evenings.  She reports a good appetite and denies any weight loss.  She continues with some forgetfulness,  which is frustrating to her, but this is no worse. She has noted some bilateral buttock pain that comes and goes.  She has taken acetaminophen for this, when needed, which  helps.  She denies any injury.  REVIEW OF SYSTEMS:  General: Forgetfulness as above. Mild fatigue. Denies fever, chills, unintentional weight loss, or night sweats. HEENT: Denies visual changes, hearing loss, mouth sores, or difficulty swallowing. Cardiac: Denies palpitations and lower extremity edema.  Respiratory: Denies wheeze or dyspnea on exertion.  Breast: As above. GI: Denies abdominal pain, constipation, diarrhea, nausea, or vomiting.  GU: Denies dysuria, hematuria, vaginal bleeding, vaginal discharge, or vaginal dryness.  Musculoskeletal: As above. Neuro: As above. Skin: Denies rash, pruritis, or open wounds.  Psych: Denies depression, anxiety, insomnia, or memory loss.   A 14-point review of systems was completed and was negative, except as noted above.   ONCOLOGY TREATMENT TEAM:  1. Surgeons:  Dr. Barry Dienes at [facility] and Dr. Iran Planas at [facility] 2. Medical Oncologist: Dr. Lindi Adie     PAST MEDICAL/SURGICAL HISTORY:  Past Medical History  Diagnosis Date  . HSV infection     takes Valtrex daily  . Urinary frequency   . Breast cancer ([place]) 2014    triple negative  . Nausea     takes Compazine and Zofran as needed  . Anemia     takes Ferrous Sulfate daily  . Depression     takes Cymbalta daily  . Insomnia     takes Ambien nightly as needed  . Hypertension     takes HCTZ daily  . Dizziness     r/t meds  . Neuropathy ([facility])     takes Lyrica daily and alternates with Gabapentin  . Joint pain   . Nocturia   . History of blood transfusion     no abnormal reaction noted  . Anxiety   . Vision blurred 02/2013  . Neuropathy of both feet ([facility])     DUE TO CHEMO PER PATIENT  . Wears glasses   . PONV (postoperative nausea and vomiting)     shaking after anesthesia; had n/v after mastectomy  . Herpes    Past Surgical History  Procedure Laterality Date  . Tubal ligation  2003    LTL  . Portacath placement N/A 07/06/2012    Procedure: INSERTION  PORT-A-CATH;  Surgeon: Stark Klein, MD;  Location: [place];  Service: General;  Laterality: N/A;  . Port-a-cath removal Left 01/12/2013    Procedure: REMOVAL PORT-A-CATH;  Surgeon: Stark Klein, MD;  Location: [place];  Service: General;  Laterality: Left;  . Breast lumpectomy Right 01/12/2013    Procedure: RIGHT SENTINEL LYMPH  NODE BIOPSY;  Surgeon: Stark Klein, MD;  Location: [place];  Service: General;  Laterality: Right;  RIGHT SENTINEL LYMPH NODE BIOPSY  . Colonoscopy    . Total mastectomy Right 03/20/2013    Procedure: RIGHT SKIN SPARING TOTAL MASTECTOMY;  Surgeon: Stark Klein, MD;  Location: [place];  Service: General;  Laterality: Right;  . Breast reconstruction with placement of tissue expander and flex hd (acellular hydrated dermis) Right 03/20/2013    Procedure: IMMEDIATE RIGHT BREAST RECONSTRUCTION WITH PLACEMENT OF TISSUE EXPANDER AND FLEX HD (ACELLULAR HYDRATED DERMIS);  Surgeon: Irene Limbo, MD;  Location: [place];  Service: Plastics;  Laterality: Right;  . Simple mastectomy with axillary sentinel node biopsy Left 07/24/2013    Procedure: SIMPLE MASTECTOMY;  Surgeon: Stark Klein, MD;  Location: [place];  Service:  General;  Laterality: Left;  . Breast reconstruction with placement of tissue expander and flex hd (acellular hydrated dermis) Left 07/24/2013    Procedure: BREAST RECONSTRUCTION WITH PLACEMENT OF TISSUE EXPANDER AND FLEX HD (ACELLULAR HYDRATED DERMIS) TO THE LEFT BREAST;  Surgeon: Irene Limbo, MD;  Location: [place];  Service: Plastics;  Laterality: Left;  Marland Kitchen Mastectomy Bilateral 03/20/2013 & June 2015    WITH RECONSRTUCTION          DR Barry Dienes   . Breast reconstruction with placement of tissue expander and flex hd (acellular hydrated dermis) Bilateral 01/07/2014    Procedure: BREAST RECONSTRUCTION WITH REMOVAL  OF TISSUE EXPANDER AND PLACEMENT  OF SILICONE IMPLANTS TO BILATERAL BREAST;  Surgeon:  Irene Limbo, MD;  Location: [place];  Service: Plastics;  Laterality: Bilateral;     ALLERGIES:  Allergies  Allergen Reactions  . Lyrica [Pregabalin] Other (See Comments)    Hallucinations and bad dreams     CURRENT MEDICATIONS:  Current Outpatient Prescriptions on File Prior to Visit  Medication Sig Dispense Refill  . gabapentin (NEURONTIN) 300 MG capsule Take 2 capsules (600 mg total) by mouth at bedtime. 180 capsule 2  . hydrochlorothiazide (HYDRODIURIL) 25 MG tablet TAKE 1 TABLET BY MOUTH ONCE DAILY 30 tablet 5  . valACYclovir (VALTREX) 500 MG tablet TAKE 1 TABLET BY MOUTH ONCE DAILY 30 tablet 3  . B Complex-C (SUPER B COMPLEX PO) Take 1 tablet by mouth daily. Reported on 07/11/2015    . ferrous sulfate 325 (65 FE) MG tablet Take 325 mg by mouth daily with breakfast. Reported on 07/11/2015    . ibuprofen (ADVIL,MOTRIN) 600 MG tablet Take 600 mg by mouth every 6 (six) hours as needed for moderate pain. Reported on 07/11/2015    . Multiple Vitamin (MULTIVITAMIN) tablet Take 1 tablet by mouth daily. Reported on 07/11/2015    . terbinafine (LAMISIL) 250 MG tablet Take 250 mg by mouth daily. Reported on 07/11/2015  0  . [DISCONTINUED] prochlorperazine (COMPAZINE) 10 MG tablet Take 1 tablet (10 mg total) by mouth every 6 (six) hours as needed (Nausea or vomiting). 30 tablet 1   No current facility-administered medications on file prior to visit.     ONCOLOGIC FAMILY HISTORY:  Family History  Problem Relation Age of Onset  . Heart disease Mother   . Aneurysm Mother   . Hypertension Father   . Prostate cancer Father 68  . Hypertension Sister   . Ovarian cancer Paternal Grandmother     died in her 30s  . Stomach cancer Maternal Grandmother 35  . Cancer Cousin     paternal cousin with an unknown form of cancer     GENETIC COUNSELING/TESTING: Yes, performed 07/05/2012: OncoGeneDX with 3 variant of unknown significances identified. VUS on MLH1 gene is heterozygous, c.2213G>A  (p.Gly738Glu).  Also heteroxygous for 2 likely benign variants on BRCA1 and NBN.  Remainder of genes in GeneDX panel negative.   SOCIAL HISTORY:  NAKAIYA BEDDOW is single and lives with her family in [place], [place].  She has 2 children. Ms. Deshpande is currently working part time at the [employer] and attending college at [employer], as above.  She denies any current or history of tobacco, alcohol, or illicit drug use.     PHYSICAL EXAMINATION:  Vital Signs: Filed Vitals:   07/11/15 1019 07/11/15 1100  BP: 135/93 134/81  Pulse: 69   Temp: 97.4 F (36.3 C)   Resp: 18    Weight 121.7 (stable) ECOG  performance status:0 General: Well-nourished, well-appearing female in no acute distress.  She is unaccompanied in clinic today.   HEENT: Head is atraumatic and normocephalic.  Pupils equal and reactive to light and accomodation. Conjunctivae clear without exudate.  Sclerae anicteric. Oral mucosa is pink, moist, and intact without lesions.  Oropharynx is pink without lesions or erythema.  Lymph: No cervical, supraclavicular, infraclavicular, or axillary lymphadenopathy noted on palpation.  Cardiovascular: Regular rate and rhythm without murmurs, rubs, or gallops. Respiratory: Clear to auscultation bilaterally. Chest expansion symmetric without accessory muscle use on inspiration or expiration.  Breast: Bilateral breast exam performed.  Bilaterally reconstructed breasts with good cosmesis.  No induration or nodularity about either implant. GI: Abdomen soft and round. No tenderness to palpation. Bowel sounds normoactive in 4 quadrants. No hepatosplenomegaly.   GU: Deferred.  Musculoskeletal: Muscle strength 5/5 in all extremities.   Neuro: No focal deficits. Steady gait.  Psych: Mood and affect normal and appropriate for situation.  Extremities: No edema, cyanosis, or clubbing.  Skin: Warm and dry. No open lesions noted.   LABORATORY DATA:  No results found for this or any previous visit (from  the past 2160 hour(s)).     ASSESSMENT AND PLAN:   1. 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 Iran Planas, followed in a program of surveillance. Ms. Almgren is doing well with no clinical symptoms worrisome for cancer recurrence at this time. I have reviewed the recommendations for ongoing surveillance with her.  She will return to see [place] in Survivorship in six month's time with history and physical exam per surveillance protocol.  She was instructed to make [place] aware if she notes any change within her breast, any new symptoms such as pain, shortness of breath, weight loss, or fatigue.   2. Buttock pain: I believe that Ms. Alverio's symptoms are consistent with sciatica, and we have discussed strategies to aid in management. She will report if these are not effective or if the pain worsens / changes in any way.    3. Neuropathy: Ms. Heagle will continue with her current dose of gabapentin.  Unfortunately, her symptoms will likely not improve beyond what they are at present based on the time since she completed therapy.  Fortunately, they do not significantly impair her quality of life and we are pleased that she is getting some benefit from the gabapentin.  She will continue to neurology.    4. Cancer screening:  Due to Ms. Lizak's history and her age, she should receive screening for skin cancers, colon cancer, and gynecologic  cancers.  The information and recommendations were shared with the patient and in her written after visit summary.  She is due to see gynecology later this month (Dr. Toney Rakes) at which time she will also undergo repeat transvaginal u/s to monitor the fibroids).    5. Health maintenance and wellness promotion:Ms. Salyers and I discussed recommendations to maximize nutrition and minimize recurrence, such as increased intake of fruits, vegetables, lean proteins, and minimizing the intake of red meats and processed  foods.  She was also encouraged to continue to engage in moderate to vigorous exercise for 30 minutes per day most days of the week.  She was instructed to limit her alcohol consumption and continue to abstain from tobacco use.   6. Support services/counseling:  Ms. Strubel was offered support today through active listening and expressive supportive counseling. I am glad that she has been able to connect with [employer] and she is excited about her volunteer opportunities.  She is attempting to work with [employer] regarding her flu vaccination requirement.  Unfortunately, in discussions with pharmacy, there are no doses available in the [facility] system.     A total of 35 minutes of face-to-face time was spent with this patient with greater than 50% of that time in counseling and care-coordination.   Sylvan Cheese, NP  Survivorship Program [facility] [phone]   Note: PRIMARY CARE PROVIDER Benito Mccreedy, [place] [phone]

## 2015-07-11 NOTE — Telephone Encounter (Signed)
appt made and letter sent by mail °

## 2015-08-06 ENCOUNTER — Encounter: Payer: Medicaid Other | Admitting: Gynecology

## 2015-08-06 MED FILL — HYDROCHLOROTHIAZIDE 25 MG T: 25 | 30 days supply | Qty: 30 | Fill #0 | Status: TO

## 2015-08-06 MED FILL — VALACYCLOVIR HCL 500 MG TAB: 500 | 30 days supply | Qty: 30 | Fill #3

## 2015-08-07 MED FILL — TERBINAFINE HCL 250 MG TAB: 250 | 30 days supply | Qty: 30 | Fill #0

## 2015-08-13 ENCOUNTER — Encounter: Payer: Medicare Other | Admitting: Gynecology

## 2015-08-13 ENCOUNTER — Other Ambulatory Visit: Payer: Medicaid Other

## 2015-08-28 ENCOUNTER — Ambulatory Visit (INDEPENDENT_AMBULATORY_CARE_PROVIDER_SITE_OTHER): Payer: Medicare Other

## 2015-08-28 ENCOUNTER — Other Ambulatory Visit: Payer: Self-pay | Admitting: Gynecology

## 2015-08-28 ENCOUNTER — Encounter: Payer: Self-pay | Admitting: Gynecology

## 2015-08-28 ENCOUNTER — Ambulatory Visit (INDEPENDENT_AMBULATORY_CARE_PROVIDER_SITE_OTHER): Payer: Medicare Other | Admitting: Gynecology

## 2015-08-28 VITALS — BP 128/86 | Ht 65.0 in | Wt 124.0 lb

## 2015-08-28 DIAGNOSIS — Z853 Personal history of malignant neoplasm of breast: Secondary | ICD-10-CM

## 2015-08-28 DIAGNOSIS — Z01419 Encounter for gynecological examination (general) (routine) without abnormal findings: Secondary | ICD-10-CM

## 2015-08-28 DIAGNOSIS — D251 Intramural leiomyoma of uterus: Secondary | ICD-10-CM

## 2015-08-28 NOTE — Progress Notes (Signed)
Jaime Murillo 1960-03-30 [id number]   History:    55 y.o.  for annual gyn exam with no complaints today her history is as follows:  In 2014 during a routine exam she was found  to have a breast mass on her right breast and subsequently sent for diagnostic mammogram and ultrasound which was followed with stereotactic biopsy and a diagnosis of atriple negative invasive ductal carcinoma with DCIS. Tumor was grade 3 was made. Patient underwent right mastectomy on 04/17/2013. Patient also has completed chemotherapy. Patientunderwent sentinel node biopsy with Dr. Barry Dienes on 01/12/13 to evaluate whether or not she was a candidate for immediate reconstruction following bilateral mastectomies. Sentinel nodes were negative for metastases and it was then decided that she did not need radiation. Patient completed bilateral nipple sparing mastectomy with reconstruction in December 2015.Her PCP is Dr. Criss Rosales who is been monitor her hyperlipidemia and hypertension. Patient received the Tdap vaccine last year. Patient's last bone density 2016 was normal. Patient is no longer suffering from vasomotor symptoms. She's been followed by her medical oncologist. Patient with history of fibroid uterus. Her ultrasound today demonstrated the following:  Uterus measures 6.1 x 5.3 x 4.0 cm with endometrial stripe of 2.7 mm. 2 small intramural fibroids largest 1 measuring 12 x 10 mm. Right and left ovary were normal. No fluid in the cul-de-sac no adnexal masses. Fibroid has kind spot or with compare with last year.  Patient had a colonoscopy in 2015.  Past medical history,surgical history, family history and social history were all reviewed and documented in the EPIC chart.  Gynecologic History No LMP recorded. Patient is not currently having periods (Reason: Chemotherapy). Contraception: post menopausal status Last Pap: 2012, 2015, 2016. Results were: normal Last mammogram: 2014. Results were: See above   Obstetric  History OB History  Gravida Para Term Preterm AB Living  '3 2 2   1 2  '$ SAB TAB Ectopic Multiple Live Births  1       2    # Outcome Date GA Lbr Len/2nd Weight Sex Delivery Anes PTL Lv  3 SAB           2 Term     M Vag-Spont  N LIV  1 Term     M Vag-Spont  N LIV       ROS: A ROS was performed and pertinent positives and negatives are included in the history.  GENERAL: No fevers or chills. HEENT: No change in vision, no earache, sore throat or sinus congestion. NECK: No pain or stiffness. CARDIOVASCULAR: No chest pain or pressure. No palpitations. PULMONARY: No shortness of breath, cough or wheeze. GASTROINTESTINAL: No abdominal pain, nausea, vomiting or diarrhea, melena or bright red blood per rectum. GENITOURINARY: No urinary frequency, urgency, hesitancy or dysuria. MUSCULOSKELETAL: No joint or muscle pain, no back pain, no recent trauma. DERMATOLOGIC: No rash, no itching, no lesions. ENDOCRINE: No polyuria, polydipsia, no heat or cold intolerance. No recent change in weight. HEMATOLOGICAL: No anemia or easy bruising or bleeding. NEUROLOGIC: No headache, seizures, numbness, tingling or weakness. PSYCHIATRIC: No depression, no loss of interest in normal activity or change in sleep pattern.     Exam: chaperone present  BP 128/86   Ht '5\' 5"'$  (1.651 m)   Wt 124 lb (56.2 kg)   BMI 20.63 kg/m   Body mass index is 20.63 kg/m.  General appearance : Well developed well nourished female. No acute distress HEENT: Eyes: no retinal hemorrhage or exudates,  Neck supple,  trachea midline, no carotid bruits, no thyroidmegaly Lungs: Clear to auscultation, no rhonchi or wheezes, or rib retractions  Heart: Regular rate and rhythm, no murmurs or gallops Breast:Examined in sitting and supine position were symmetrical in appearance, no palpable masses or tenderness,  no skin retraction, no nipple inversion, no nipple discharge, no skin discoloration, no axillary or supraclavicular  lymphadenopathy Abdomen: no palpable masses or tenderness, no rebound or guarding Extremities: no edema or skin discoloration or tenderness  Pelvic:  Bartholin, Urethra, Skene Glands: Within normal limits             Vagina: No gross lesions or discharge  Cervix: No gross lesions or discharge  Uterus  anteverted, normal size, shape and consistency, non-tender and mobile  Adnexa  Without masses or tenderness  Anus and perineum  normal   Rectovaginal  normal sphincter tone without palpated masses or tenderness             Hemoccult cards will be provided     Assessment/Plan:  55 y.o. 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 Patient has informed me that she was tested for the BRCA one BRCA2 gene mutation and was negative. Patient is doing well. Patient will continue with her calcium vitamin D and weightbearing exercises for osteoporosis prevention. Pap smear not indicated this year. Fecal Hemoccult cards were provided to submit to the office for testing.   [name] MD, 4:22 PM 08/28/2015

## 2016-01-08 NOTE — Progress Notes (Signed)
CLINIC:  Survivorship   REASON FOR VISIT:  Routine follow-up for history of breast cancer.   BRIEF ONCOLOGIC HISTORY:    Cancer of lower-inner quadrant of RIGHT female breast   06/20/2012 Initial Biopsy    Right breast needle core biopsy (3 o'clock): Grade 3, IDC & DCIS.  ER- (0%), PR- (0%), HER2- (ratio 1.26). Ki67 95%.       06/20/2012 Breast US    Right breast with slightly irregular oval hypoechoic mass measuring 7 x 6 x 17 mm at 3 o'clock postion. No mammographic evidence of breast malignancy bilaterally. Palpable area of concern at 12 o'clock, but no suspicious findings at this location on [place].       06/23/2012 Initial Diagnosis    Cancer of lower-inner quadrant of RIGHT female breast      06/26/2012 Breast MRI    Right breast w/clumped NME at 2 o'clock position measuring 0.8 x 1.6 x 2.2 cm located anterior/superior to known cancer. Also 6 mm ill-defined ovoid mass posterior lateral to bx-proven ca, lying superficial pect. muscle. Likely multifocal disease.       07/03/2012 Echocardiogram    Pre-treatment EF: 55-60%      07/03/2012 Imaging    CT c/a/p: No evidence of metastatic disease.       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  Also heteroxygous for 2 likely benign variants on BRCA1 and NBN.  Remainder of genes in GeneDX panel negative.       07/12/2012 Procedure    Right breast needle core biopsy: Benign breast tissue with focal pseudoangiomatous stromal hyperplasic (PASH). No evidence of malignancy.       07/17/2012 - 08/28/2012 Neo-Adjuvant Chemotherapy    Adriamycin & Cytoxan x 4 cycles completed.       09/11/2012 - 10/23/2012 Neo-Adjuvant Chemotherapy    Taxol/Carboplatin weekly x 7 cycles (stopped due to peripheral neuropathy; changed to Gemcitabine/Carbo for additional 3 cycles).      11/06/2012 - 12/04/2012 Neo-Adjuvant Chemotherapy   Gemcitabine/Carboplatin x 3 cycles completed.       11/24/2012 Breast MRI    Previous right breast multifocal malignancy with complete imaging resolution.  Stable 6 mm hemangioma or cyst in liver.       01/12/2013 Surgery    Right axillary SLNB ([place]):  3 SLNs negative.  Port also removed on this date.       03/20/2013 Definitive Surgery    Right skin-sparing total mastectomy Barry Dienes) with immediate reconstruction: No atypia or malignancy identified.  Benign skin & nipple. s/p neoadjuvant therapy.  Represents a complete response. Placement of tissue expanders.       03/20/2013 Pathologic Stage    ypT0; No evidence of cancer.       07/24/2013 Surgery    LEFT simple mastectomy with immediate reconstruction; showed ALH and fibroadenoma. Placement of tissue expanders.       01/07/2014 Surgery    Bilateral breast reconstruction ([place]): Removal of bilateral tissue expanders and placement of bilateral silicone breast implants.       06/03/2014 Survivorship    Survivorship Care Plan given to patient and reveiwed with her during in-person visit.         INTERVAL HISTORY:  Jaime Murillo presents to the Survivorship Clinic today for routine follow-up for her history of breast cancer.  Overall, she reports feeling quite well.   She saw Dr. Iran Planas a few weeks ago and wanted to  update me on what she learned at that visit.  Jaime Murillo tells me that Dr. Iran Planas reviewed new data with her that there are reports that the type of breast implants Jaime Murillo had placed have been causing complications (like severe infection and cancer). At this time, the implants are not causing Jaime Murillo any problems and she and Dr. Iran Planas plan to continue to monitor her closely.  Jaime Murillo was told that should she experience any changes in her breasts with the implants, that she should call one of our offices immediately.  She brought copies of the report that was reviewed with her by Dr. Iran Planas that she wants to  make sure we have on file for her records regarding her implants.    Otherwise, Jaime Murillo is doing well and is largely with no complaints.  She is exercising regularly.  She is still in school at [employer] and is looking forward to next semester.  She tells me she was chosen to study abroad in [place] next summer (06/2016) to learn more Spanish.  She is very excited about this opportunity.      REVIEW OF SYSTEMS:  Review of Systems  Constitutional: Negative.   HENT:  Negative.   Eyes: Negative.   Respiratory: Negative.   Cardiovascular: Negative.   Gastrointestinal: Negative.   Endocrine: Negative.   Genitourinary: Negative.    Musculoskeletal: Negative.   Skin: Negative.   Neurological: Negative.   Hematological: Negative.   Psychiatric/Behavioral: Negative.   Breast: Denies any new nodularity, masses, tenderness.   A 14-point review of systems was completed and was negative, except as noted above.    PAST MEDICAL/SURGICAL HISTORY:  Past Medical History:  Diagnosis Date  . Anemia    takes Ferrous Sulfate daily  . Anxiety   . Breast cancer ([place]) 2014   triple negative  . Depression    takes Cymbalta daily  . Dizziness    r/t meds  . Herpes   . History of blood transfusion    no abnormal reaction noted  . HSV infection    takes Valtrex daily  . Hypertension    takes HCTZ daily  . Insomnia    takes Ambien nightly as needed  . Joint pain   . Nausea    takes Compazine and Zofran as needed  . Neuropathy ([facility])    takes Lyrica daily and alternates with Gabapentin  . 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 Nocturia   . PONV (postoperative nausea and vomiting)    shaking after anesthesia; had n/v after mastectomy  . Urinary frequency   . Vision blurred 02/2013  . Wears glasses    Past Surgical History:  Procedure Laterality Date  . BREAST LUMPECTOMY Right 01/12/2013   Procedure: RIGHT SENTINEL LYMPH  NODE BIOPSY;  Surgeon: Stark Klein, MD;  Location:  [place];  Service: General;  Laterality: Right;  RIGHT SENTINEL LYMPH NODE BIOPSY  . BREAST RECONSTRUCTION WITH PLACEMENT OF TISSUE EXPANDER AND FLEX HD (ACELLULAR HYDRATED DERMIS) Right 03/20/2013   Procedure: IMMEDIATE RIGHT BREAST RECONSTRUCTION WITH PLACEMENT OF TISSUE EXPANDER AND FLEX HD (ACELLULAR HYDRATED DERMIS);  Surgeon: Irene Limbo, MD;  Location: [place];  Service: Plastics;  Laterality: Right;  . BREAST RECONSTRUCTION WITH PLACEMENT OF TISSUE EXPANDER AND FLEX HD (ACELLULAR HYDRATED DERMIS) Left 07/24/2013   Procedure: BREAST RECONSTRUCTION WITH PLACEMENT OF TISSUE EXPANDER AND FLEX HD (ACELLULAR HYDRATED DERMIS) TO THE LEFT BREAST;  Surgeon: Irene Limbo, MD;  Location: [place];  Service: Plastics;  Laterality: Left;  . BREAST RECONSTRUCTION WITH PLACEMENT OF TISSUE EXPANDER AND FLEX HD (ACELLULAR HYDRATED DERMIS) Bilateral 01/07/2014   Procedure: BREAST RECONSTRUCTION WITH REMOVAL  OF TISSUE EXPANDER AND PLACEMENT  OF SILICONE IMPLANTS TO BILATERAL BREAST;  Surgeon: Irene Limbo, MD;  Location: [place];  Service: Plastics;  Laterality: Bilateral;  . COLONOSCOPY    . MASTECTOMY Bilateral 03/20/2013 & June 2015   WITH RECONSRTUCTION          DR Barry Dienes   . PORT-A-CATH REMOVAL Left 01/12/2013   Procedure: REMOVAL PORT-A-CATH;  Surgeon: Stark Klein, MD;  Location: [place];  Service: General;  Laterality: Left;  . PORTACATH PLACEMENT N/A 07/06/2012   Procedure: INSERTION PORT-A-CATH;  Surgeon: Stark Klein, MD;  Location: [place];  Service: General;  Laterality: N/A;  . SIMPLE MASTECTOMY WITH AXILLARY SENTINEL NODE BIOPSY Left 07/24/2013   Procedure: SIMPLE MASTECTOMY;  Surgeon: Stark Klein, MD;  Location: [place];  Service: General;  Laterality: Left;  . TOTAL MASTECTOMY Right 03/20/2013   Procedure: RIGHT SKIN SPARING TOTAL MASTECTOMY;  Surgeon: Stark Klein, MD;  Location: [place];  Service: General;  Laterality:  Right;  . TUBAL LIGATION  2003   LTL     ALLERGIES:  Allergies  Allergen Reactions  . Lyrica [Pregabalin] Other (See Comments)    Hallucinations and bad dreams     CURRENT MEDICATIONS:  Outpatient Encounter Prescriptions as of 01/09/2016  Medication Sig Note  . B Complex-C (SUPER B COMPLEX PO) Take 1 tablet by mouth daily. Reported on 07/11/2015   . ferrous sulfate 325 (65 FE) MG tablet Take 325 mg by mouth daily with breakfast. Reported on 07/11/2015 12/31/2013: Patient has stopped taking but is planning on starting back today.  . gabapentin (NEURONTIN) 300 MG capsule Take 2 capsules (600 mg total) by mouth at bedtime.   . hydrochlorothiazide (HYDRODIURIL) 25 MG tablet TAKE 1 TABLET BY MOUTH ONCE DAILY   . ibuprofen (ADVIL,MOTRIN) 600 MG tablet Take 600 mg by mouth every 6 (six) hours as needed for moderate pain. Reported on 07/11/2015   . Multiple Vitamin (MULTIVITAMIN) tablet Take 1 tablet by mouth daily. Reported on 07/11/2015   . terbinafine (LAMISIL) 250 MG tablet Take 250 mg by mouth daily. Reported on 07/11/2015 11/19/2014: Received from: External Pharmacy  . valACYclovir (VALTREX) 500 MG tablet TAKE 1 TABLET BY MOUTH ONCE DAILY    No facility-administered encounter medications on file as of 01/09/2016.      ONCOLOGIC FAMILY HISTORY:  Family History  Problem Relation Age of Onset  . Heart disease Mother   . Aneurysm Mother   . Hypertension Father   . Prostate cancer Father 38  . Hypertension Sister   . Ovarian cancer Paternal Grandmother     died in her 49s  . Stomach cancer Maternal Grandmother 44  . 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  Also heteroxygous for 2 likely benign variants on BRCA1 and NBN.  Remainder of genes in GeneDX panel negative.    SOCIAL HISTORY:  ADELEINE PASK is single and lives with her family in [place], [place].  She has 2 sons, ages 47 & 51.  She works part-time at [employer] and continues to study at [employer].  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  She denies any current or history of tobacco, alcohol, or illicit drug use.     PHYSICAL EXAMINATION:  Vital Signs: Vitals:   01/09/16 1111 01/09/16 1123  BP: 115/80 115/80  Pulse: 67 67  Resp: 18 18  Temp: 97.7 F (36.5 C) 97.7 F (36.5 C)   Filed Weights   01/09/16 1111 01/09/16 1123  Weight: 123 lb 14.4 oz (56.2 kg) 123 lb 14.4 oz (56.2 kg)   General: Well-nourished, well-appearing female in no acute distress.  Unaccompanied today.   HEENT: Head is normocephalic.  Pupils equal and reactive to light. Conjunctivae clear without exudate.  Sclerae anicteric. Oral mucosa is pink, moist.  Oropharynx is pink without lesions or erythema.  Lymph: No cervical, supraclavicular, or infraclavicular lymphadenopathy noted on palpation.  Cardiovascular: Regular rate and rhythm.Marland Kitchen Respiratory: Clear to auscultation bilaterally. Chest expansion symmetric; breathing non-labored.  Breast Exam:  -Left breast: s/p mastectomy with reconstruction.  No appreciable masses on palpation. No skin redness, thickening, or peau d'orange appearance; healed scar without erythema or nodularity.  -Right breast: s/p mastectomy with reconstruction.  No appreciable masses on palpation. No skin redness, thickening, or peau d'orange appearance; healed scar without erythema or nodularity -Axilla: No axillary adenopathy bilaterally.  GI: Abdomen soft and round; non-tender, non-distended. Bowel sounds normoactive. No hepatosplenomegaly.   GU: Deferred.  Neuro: No focal deficits. Steady gait.  Psych: Mood and affect normal and appropriate for situation.  Extremities: No edema. Skin: Warm and dry.  LABORATORY DATA:  None for this visit.   DIAGNOSTIC IMAGING:  Most recent mammogram: None (bilateral  mastectomies with reconstruction)   INFORMATION SHARED BY PATIENT REGARDING HER BREAST IMPLANTS:       ASSESSMENT AND PLAN:  Jaime Murillo is a pleasant 55 y.o. female with history of Stage IA right breast invasive ductal carcinoma, ER-/PR-/HER2-, diagnosed in 06/20/12;  treated with neoadjuvant chemotherapy with Adriamycin/Cytoxan x 4 cycles, follwed by Taxol/Carbo x 7 cycles (Taxol discontinued d/t peripheral neuropathy). Patient went on to complete 3 additional cycles of Carbo/Gemcitabine and finished chemo on 12/04/12.  Underwent bilateral mastectomies with reconstruction. Her breast reconstruction was completed on 01/07/14 with Dr. Iran Planas. She presents to the Survivorship Clinic for surveillance and routine follow-up.   1. History of Stage IA right breast cancer:  Jaime Murillo is currently clinically without evidence of disease or recurrence of breast cancer. No mammograms necessary given bilateral mastectomies.  She will return to the cancer center to see Survivorship NP, in 06/2016 for continued surveillance.  Since she is scheduled to see Dr. Iran Planas annually in December, we will see her in June each year. With this schedule, the patient will be seen and examined every 6 months. I encouraged her to call me with any questions or concerns before her next visit at the cancer center, and I would be happy to see her sooner, if needed.    2. 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 We will certainly continue to monitor the patient with breast exam. I reinforced the importance of her calling us should she have any new symptoms.  3. Bone health:  Given Jaime Murillo's age & history of breast cancer, she is at risk for bone demineralization. Her last DEXA scan was on 08/01/14 and was normal.  In the meantime, she was encouraged to increase her consumption of foods rich in calcium, as  well as increase  her weight-bearing activities.  She was given education on specific food and activities to promote bone health.  4. Health maintenance and wellness promotion: Jaime Murillo was encouraged to consume 5-7 servings of fruits and vegetables per day. She was also encouraged to engage in moderate to vigorous exercise for 30 minutes per day most days of the week. She was instructed to limit her alcohol consumption and continue to abstain from tobacco use.    Dispo:  -Return to cancer center to see Survivorship NP in 06/2016.    A total of 30 minutes of face-to-face time was spent with this patient with greater than 50% of that time in counseling and care-coordination.   Mike Craze, NP Survivorship Program [place] [phone]   Note: PRIMARY CARE PROVIDER Benito Mccreedy, [place] [phone]

## 2016-01-09 ENCOUNTER — Ambulatory Visit (HOSPITAL_BASED_OUTPATIENT_CLINIC_OR_DEPARTMENT_OTHER): Payer: Medicare Other | Admitting: Adult Health

## 2016-01-09 VITALS — BP 115/80 | HR 67 | Temp 97.7°F | Resp 18 | Ht 65.0 in | Wt 123.9 lb

## 2016-01-09 DIAGNOSIS — Z853 Personal history of malignant neoplasm of breast: Secondary | ICD-10-CM

## 2016-01-09 DIAGNOSIS — C50311 Malignant neoplasm of lower-inner quadrant of right female breast: Secondary | ICD-10-CM

## 2016-01-14 ENCOUNTER — Encounter: Payer: Self-pay | Admitting: Adult Health

## 2016-01-30 DIAGNOSIS — E875 Hyperkalemia: Secondary | ICD-10-CM | POA: Diagnosis not present

## 2016-01-30 DIAGNOSIS — R7303 Prediabetes: Secondary | ICD-10-CM | POA: Diagnosis not present

## 2016-01-30 DIAGNOSIS — I10 Essential (primary) hypertension: Secondary | ICD-10-CM | POA: Diagnosis not present

## 2016-01-30 DIAGNOSIS — E785 Hyperlipidemia, unspecified: Secondary | ICD-10-CM | POA: Diagnosis not present

## 2016-01-30 DIAGNOSIS — G620 Drug-induced polyneuropathy: Secondary | ICD-10-CM | POA: Diagnosis not present

## 2016-01-30 DIAGNOSIS — E559 Vitamin D deficiency, unspecified: Secondary | ICD-10-CM | POA: Diagnosis not present

## 2016-03-12 ENCOUNTER — Telehealth: Payer: Self-pay | Admitting: *Deleted

## 2016-03-12 NOTE — Telephone Encounter (Signed)
Patient called to speak to Elzie Rings in regards to having a medical release signed for a 'fly fishing"camp retreat. She faxed documents to writer however the medical report page was missing. Called to notify patient. Form is at [place], NP nurse desk waiting completion.

## 2016-06-09 ENCOUNTER — Encounter: Payer: Self-pay | Admitting: Gynecology

## 2016-07-08 NOTE — Progress Notes (Signed)
CLINIC:  Survivorship   REASON FOR VISIT:  Routine follow-up for history of breast cancer.   BRIEF ONCOLOGIC HISTORY:    Cancer of lower-inner quadrant of RIGHT female breast   06/20/2012 Initial Biopsy    Right breast needle core biopsy (3 o'clock): Grade 3, IDC & DCIS.  ER- (0%), PR- (0%), HER2- (ratio 1.26). Ki67 95%.       06/20/2012 Breast US    Right breast with slightly irregular oval hypoechoic mass measuring 7 x 6 x 17 mm at 3 o'clock postion. No mammographic evidence of breast malignancy bilaterally. Palpable area of concern at 12 o'clock, but no suspicious findings at this location on [place].       06/23/2012 Initial Diagnosis    Cancer of lower-inner quadrant of RIGHT female breast      06/26/2012 Breast MRI    Right breast w/clumped NME at 2 o'clock position measuring 0.8 x 1.6 x 2.2 cm located anterior/superior to known cancer. Also 6 mm ill-defined ovoid mass posterior lateral to bx-proven ca, lying superficial pect. muscle. Likely multifocal disease.       07/03/2012 Echocardiogram    Pre-treatment EF: 55-60%      07/03/2012 Imaging    CT c/a/p: No evidence of metastatic disease.       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  Also heteroxygous for 2 likely benign variants on BRCA1 and NBN.  Remainder of genes in GeneDX panel negative.       07/12/2012 Procedure    Right breast needle core biopsy: Benign breast tissue with focal pseudoangiomatous stromal hyperplasic (PASH). No evidence of malignancy.       07/17/2012 - 08/28/2012 Neo-Adjuvant Chemotherapy    Adriamycin & Cytoxan x 4 cycles completed.       09/11/2012 - 10/23/2012 Neo-Adjuvant Chemotherapy    Taxol/Carboplatin weekly x 7 cycles (stopped due to peripheral neuropathy; changed to Gemcitabine/Carbo for additional 3 cycles).      11/06/2012 - 12/04/2012 Neo-Adjuvant Chemotherapy   Gemcitabine/Carboplatin x 3 cycles completed.       11/24/2012 Breast MRI    Previous right breast multifocal malignancy with complete imaging resolution.  Stable 6 mm hemangioma or cyst in liver.       01/12/2013 Surgery    Right axillary SLNB ([place]):  3 SLNs negative.  Port also removed on this date.       03/20/2013 Definitive Surgery    Right skin-sparing total mastectomy Barry Dienes) with immediate reconstruction: No atypia or malignancy identified.  Benign skin & nipple. s/p neoadjuvant therapy.  Represents a complete response. Placement of tissue expanders.       03/20/2013 Pathologic Stage    ypT0; No evidence of cancer.       07/24/2013 Surgery    LEFT simple mastectomy with immediate reconstruction; showed ALH and fibroadenoma. Placement of tissue expanders.       01/07/2014 Surgery    Bilateral breast reconstruction ([place]): Removal of bilateral tissue expanders and placement of bilateral silicone breast implants.       06/03/2014 Survivorship    Survivorship Care Plan given to patient and reveiwed with her during in-person visit.         INTERVAL HISTORY:  [name] presents to the Survivorship Clinic today for routine follow-up for her history of breast cancer.  Overall, she reports feeling quite well. Jaime Murillo is doing well today.  She does have intermittent pain in her  feet along with intermittent numbness that has been going on since chemotherapy.  She will take tylenol or Ibuprofen first, and if it doesn't help then she will try Gabapentin.  She has a PCP she sees regularly.  She has a gynecologist she sees regularly as well.  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        REVIEW OF SYSTEMS:  Review of Systems  Constitutional: Negative for appetite change, chills, diaphoresis, fatigue, fever and unexpected weight change.  HENT:   Negative for hearing loss and lump/mass.   Eyes:  Negative for eye problems.  Respiratory: Negative for chest tightness, cough and shortness of breath.   Cardiovascular: Negative for chest pain, leg swelling and palpitations.  Gastrointestinal: Negative for abdominal distention and abdominal pain.  Endocrine: Negative for hot flashes.  Genitourinary: Negative for difficulty urinating.   Musculoskeletal: Negative for arthralgias and back pain.  Skin: Negative for itching and rash.  Neurological: Negative for dizziness.  Hematological: Negative for adenopathy. Does not bruise/bleed easily.  Psychiatric/Behavioral: Negative for depression. The patient is not nervous/anxious.   Breast: Denies any new nodularity, masses, tenderness, nipple changes, or nipple discharge.       PAST MEDICAL/SURGICAL HISTORY:  Past Medical History:  Diagnosis Date  . Anemia    takes Ferrous Sulfate daily  . Anxiety   . Breast cancer ([place]) 2014   triple negative  . Depression    takes Cymbalta daily  . Dizziness    r/t meds  . Herpes   . History of blood transfusion    no abnormal reaction noted  . HSV infection    takes Valtrex daily  . Hypertension    takes HCTZ daily  . Insomnia    takes Ambien nightly as needed  . Joint pain   . Nausea    takes Compazine and Zofran as needed  . Neuropathy ([facility])    takes Lyrica daily and alternates with Gabapentin  . 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 Nocturia   . PONV (postoperative nausea and vomiting)    shaking after anesthesia; had n/v after mastectomy  . Urinary frequency   . Vision blurred 02/2013  . Wears glasses    Past Surgical History:  Procedure Laterality Date  . BREAST LUMPECTOMY Right 01/12/2013   Procedure: RIGHT SENTINEL LYMPH  NODE BIOPSY;  Surgeon: Stark Klein, MD;  Location: [facility];  Service: General;  Laterality: Right;  RIGHT SENTINEL LYMPH NODE BIOPSY  . BREAST RECONSTRUCTION WITH PLACEMENT OF TISSUE EXPANDER AND FLEX HD (ACELLULAR  HYDRATED DERMIS) Right 03/20/2013   Procedure: IMMEDIATE RIGHT BREAST RECONSTRUCTION WITH PLACEMENT OF TISSUE EXPANDER AND FLEX HD (ACELLULAR HYDRATED DERMIS);  Surgeon: Irene Limbo, MD;  Location: [facility];  Service: Plastics;  Laterality: Right;  . BREAST RECONSTRUCTION WITH PLACEMENT OF TISSUE EXPANDER AND FLEX HD (ACELLULAR HYDRATED DERMIS) Left 07/24/2013   Procedure: BREAST RECONSTRUCTION WITH PLACEMENT OF TISSUE EXPANDER AND FLEX HD (ACELLULAR HYDRATED DERMIS) TO THE LEFT BREAST;  Surgeon: Irene Limbo, MD;  Location: [facility];  Service: Plastics;  Laterality: Left;  . BREAST RECONSTRUCTION WITH PLACEMENT OF TISSUE EXPANDER AND FLEX HD (ACELLULAR HYDRATED DERMIS) Bilateral 01/07/2014   Procedure: BREAST RECONSTRUCTION WITH REMOVAL  OF TISSUE EXPANDER AND PLACEMENT  OF SILICONE IMPLANTS TO BILATERAL BREAST;  Surgeon: Irene Limbo, MD;  Location: [facility];  Service: Plastics;  Laterality: Bilateral;  . COLONOSCOPY    . MASTECTOMY Bilateral 03/20/2013 & June 2015   WITH RECONSRTUCTION          DR Barry Dienes   . PORT-A-CATH REMOVAL Left 01/12/2013   Procedure: REMOVAL PORT-A-CATH;  Surgeon: Stark Klein, MD;  Location: [facility];  Service: General;  Laterality: Left;  . PORTACATH PLACEMENT N/A 07/06/2012   Procedure: INSERTION PORT-A-CATH;  Surgeon: Stark Klein, MD;  Location: [facility];  Service: General;  Laterality: N/A;  . SIMPLE MASTECTOMY WITH AXILLARY SENTINEL NODE BIOPSY Left 07/24/2013   Procedure: SIMPLE MASTECTOMY;  Surgeon: Stark Klein, MD;  Location: [facility];  Service: General;  Laterality: Left;  . TOTAL MASTECTOMY Right 03/20/2013   Procedure: RIGHT SKIN SPARING TOTAL MASTECTOMY;  Surgeon: Stark Klein, MD;  Location: [facility];  Service: General;  Laterality: Right;  . TUBAL LIGATION  2003   LTL     ALLERGIES:  Allergies  Allergen Reactions  . Lyrica [Pregabalin] Other (See Comments)    Hallucinations and bad dreams      CURRENT MEDICATIONS:  Outpatient Encounter Prescriptions as of 07/09/2016  Medication Sig Note  . B Complex-C (SUPER B COMPLEX PO) Take 1 tablet by mouth daily. Reported on 07/11/2015   . ferrous sulfate 325 (65 FE) MG tablet Take 325 mg by mouth daily with breakfast. Reported on 07/11/2015 12/31/2013: Patient has stopped taking but is planning on starting back today.  . gabapentin (NEURONTIN) 300 MG capsule Take 2 capsules (600 mg total) by mouth at bedtime.   . hydrochlorothiazide (HYDRODIURIL) 25 MG tablet TAKE 1 TABLET BY MOUTH ONCE DAILY   . ibuprofen (ADVIL,MOTRIN) 600 MG tablet Take 600 mg by mouth every 6 (six) hours as needed for moderate pain. Reported on 07/11/2015   . Multiple Vitamin (MULTIVITAMIN) tablet Take 1 tablet by mouth daily. Reported on 07/11/2015   . valACYclovir (VALTREX) 500 MG tablet TAKE 1 TABLET BY MOUTH ONCE DAILY   . valACYclovir (VALTREX) 500 MG tablet Take 500 mg by mouth.   . terbinafine (LAMISIL) 250 MG tablet Take 250 mg by mouth daily. Reported on 07/11/2015 11/19/2014: Received from: External Pharmacy   No facility-administered encounter medications on file as of 07/09/2016.      ONCOLOGIC FAMILY HISTORY:  Family History  Problem Relation Age of Onset  . Heart disease Mother   . Aneurysm Mother   . Hypertension Father   . Prostate cancer Father 31  . Hypertension Sister   . Ovarian cancer Paternal Grandmother        died in her 67s  . Stomach cancer Maternal Grandmother 76  . Cancer Cousin        paternal cousin with an unknown form of cancer    SOCIAL HISTORY:  SHAWANNA ZANDERS is single and lives in [place], [place]. She is studying at [employer] and is about to travel abroad in an immersion course.  She denies any current or history of tobacco, alcohol, or illicit drug use.     PHYSICAL EXAMINATION:  Vital Signs: Vitals:   07/09/16 1122  BP: (!) 129/91  Pulse: 70  Resp: 17  Temp: 98.2 F (36.8 C)   Filed Weights   07/09/16 1122   Weight: 130 lb 4.8 oz (59.1 kg)   General: Well-nourished, well-appearing female in no acute distress.  Unaccompanied today.   HEENT: Head is normocephalic.  Pupils equal and reactive to light. Conjunctivae clear without exudate.  Sclerae anicteric. Oral mucosa is pink, moist.  Oropharynx is pink without lesions or erythema.  Lymph: No cervical, supraclavicular, or infraclavicular lymphadenopathy noted on palpation.  Cardiovascular: Regular rate and rhythm.Marland Kitchen Respiratory: Clear to auscultation bilaterally. Chest expansion symmetric; breathing non-labored.  Breast Exam:  -Breasts: bilateral breast s/p mastectomy and implant placement, no swelling, nodularity, skin change noted, benign bilateral breast exam -Axilla: No axillary adenopathy bilaterally.  GI: Abdomen soft and round; non-tender, non-distended. Bowel sounds normoactive. No hepatosplenomegaly.   GU: Deferred.  Neuro: No focal deficits. Steady gait.  Psych: Mood and affect normal and appropriate for situation.  MSK: No focal spinal tenderness to palpation, full range of motion in bilateral upper extremities Extremities: No edema. Skin: Warm and dry.  LABORATORY DATA:  None for this visit   DIAGNOSTIC IMAGING:  Most recent mammogram: n/a s/p bilateral mastectomy    ASSESSMENT AND PLAN:  Ms.. Roscher is a pleasant 56 y.o. female with history of Stage IA right breast invasive ductal carcinoma, ER-/PR-/HER2-, diagnosed in 05/2012, treated with neoadjuvant chemotherapy, bilateral mastectomy.  She presents to the Survivorship Clinic for surveillance and routine follow-up.   1. History of breast cancer:  Ms. Millikin is currently clinically and radiographically without evidence of disease or recurrence of breast cancer.   I encouraged her to call me with any questions or concerns before her next visit at the cancer center, and I would be happy to see her sooner, if needed.    2. Bone health:  Given Ms. Mctier's age, history of breast  cancer, she is at risk for bone demineralization.  She was given education on specific food and activities to promote bone health.  I will defer to her PCP or OB-GYN regarding ordering and management of bone density testing.    3. Cancer screening:  Due to Ms. Pangborn's history and her age, she should receive screening for skin cancers, colon cancer, and gynecologic cancers. She was encouraged to follow-up with her PCP for appropriate cancer screenings.   4. Health maintenance and wellness promotion: Ms. Culbertson was encouraged to consume 5-7 servings of fruits and vegetables per day. She was also encouraged to engage in moderate to vigorous exercise for 30 minutes per day most days of the week. She was instructed to limit her alcohol consumption and continue to abstain from tobacco use.    Dispo:  -Return to cancer center in one year for LTS follow up   A total of (30) minutes of face-to-face time was spent with this patient with greater than 50% of that time in counseling and care-coordination.   Gardenia Phlegm, NP Survivorship Program [facility] [phone]   Note: PRIMARY CARE PROVIDER Benito Mccreedy, [place] [phone]

## 2016-07-09 ENCOUNTER — Encounter: Payer: Medicare Other | Admitting: Adult Health

## 2016-07-09 ENCOUNTER — Ambulatory Visit (HOSPITAL_BASED_OUTPATIENT_CLINIC_OR_DEPARTMENT_OTHER): Payer: Medicare Other | Admitting: Adult Health

## 2016-07-09 VITALS — BP 129/91 | HR 70 | Temp 98.2°F | Resp 17 | Ht 65.0 in | Wt 130.3 lb

## 2016-07-09 DIAGNOSIS — C50311 Malignant neoplasm of lower-inner quadrant of right female breast: Secondary | ICD-10-CM

## 2016-07-09 DIAGNOSIS — Z853 Personal history of malignant neoplasm of breast: Secondary | ICD-10-CM | POA: Diagnosis not present

## 2016-07-09 DIAGNOSIS — Z171 Estrogen receptor negative status [ER-]: Principal | ICD-10-CM

## 2016-07-10 ENCOUNTER — Encounter: Payer: Self-pay | Admitting: Adult Health

## 2016-07-14 DIAGNOSIS — E785 Hyperlipidemia, unspecified: Secondary | ICD-10-CM | POA: Diagnosis not present

## 2016-07-14 DIAGNOSIS — Z131 Encounter for screening for diabetes mellitus: Secondary | ICD-10-CM | POA: Diagnosis not present

## 2016-07-14 DIAGNOSIS — Z0100 Encounter for examination of eyes and vision without abnormal findings: Secondary | ICD-10-CM | POA: Diagnosis not present

## 2016-07-14 DIAGNOSIS — E559 Vitamin D deficiency, unspecified: Secondary | ICD-10-CM | POA: Diagnosis not present

## 2016-07-14 DIAGNOSIS — Z01118 Encounter for examination of ears and hearing with other abnormal findings: Secondary | ICD-10-CM | POA: Diagnosis not present

## 2016-07-14 DIAGNOSIS — Z113 Encounter for screening for infections with a predominantly sexual mode of transmission: Secondary | ICD-10-CM | POA: Diagnosis not present

## 2016-07-14 DIAGNOSIS — Z1389 Encounter for screening for other disorder: Secondary | ICD-10-CM | POA: Diagnosis not present

## 2016-07-14 DIAGNOSIS — Z0000 Encounter for general adult medical examination without abnormal findings: Secondary | ICD-10-CM | POA: Diagnosis not present

## 2016-07-14 DIAGNOSIS — I10 Essential (primary) hypertension: Secondary | ICD-10-CM | POA: Diagnosis not present

## 2016-07-14 DIAGNOSIS — Z136 Encounter for screening for cardiovascular disorders: Secondary | ICD-10-CM | POA: Diagnosis not present

## 2016-07-14 DIAGNOSIS — G620 Drug-induced polyneuropathy: Secondary | ICD-10-CM | POA: Diagnosis not present

## 2016-07-14 DIAGNOSIS — B009 Herpesviral infection, unspecified: Secondary | ICD-10-CM | POA: Diagnosis not present

## 2016-07-14 DIAGNOSIS — R7303 Prediabetes: Secondary | ICD-10-CM | POA: Diagnosis not present

## 2016-11-29 ENCOUNTER — Telehealth: Payer: Self-pay | Admitting: Hematology and Oncology

## 2016-11-29 NOTE — Telephone Encounter (Signed)
Scheduled appt per 11/2 sch message - patient is aware of appt date and time.

## 2016-12-01 DIAGNOSIS — G620 Drug-induced polyneuropathy: Secondary | ICD-10-CM | POA: Diagnosis not present

## 2016-12-01 DIAGNOSIS — I10 Essential (primary) hypertension: Secondary | ICD-10-CM | POA: Diagnosis not present

## 2016-12-01 DIAGNOSIS — E559 Vitamin D deficiency, unspecified: Secondary | ICD-10-CM | POA: Diagnosis not present

## 2016-12-01 DIAGNOSIS — Z23 Encounter for immunization: Secondary | ICD-10-CM | POA: Diagnosis not present

## 2016-12-01 DIAGNOSIS — B009 Herpesviral infection, unspecified: Secondary | ICD-10-CM | POA: Diagnosis not present

## 2016-12-01 DIAGNOSIS — E785 Hyperlipidemia, unspecified: Secondary | ICD-10-CM | POA: Diagnosis not present

## 2016-12-01 DIAGNOSIS — R7303 Prediabetes: Secondary | ICD-10-CM | POA: Diagnosis not present

## 2016-12-06 ENCOUNTER — Ambulatory Visit: Payer: Medicare Other

## 2016-12-30 DIAGNOSIS — Z853 Personal history of malignant neoplasm of breast: Secondary | ICD-10-CM | POA: Diagnosis not present

## 2016-12-30 DIAGNOSIS — Z9013 Acquired absence of bilateral breasts and nipples: Secondary | ICD-10-CM | POA: Diagnosis not present

## 2016-12-30 DIAGNOSIS — N650 Deformity of reconstructed breast: Secondary | ICD-10-CM | POA: Diagnosis not present

## 2016-12-30 DIAGNOSIS — Z08 Encounter for follow-up examination after completed treatment for malignant neoplasm: Secondary | ICD-10-CM | POA: Diagnosis not present

## 2017-01-07 ENCOUNTER — Encounter: Payer: Medicare Other | Admitting: Adult Health

## 2017-01-07 NOTE — Progress Notes (Deleted)
CLINIC:  Survivorship   REASON FOR VISIT:  Routine follow-up for history of breast cancer.   BRIEF ONCOLOGIC HISTORY:    Cancer of lower-inner quadrant of RIGHT female breast   06/20/2012 Initial Biopsy    Right breast needle core biopsy (3 o'clock): Grade 3, IDC & DCIS.  ER- (0%), PR- (0%), HER2- (ratio 1.26). Ki67 95%.       06/20/2012 Breast US    Right breast with slightly irregular oval hypoechoic mass measuring 7 x 6 x 17 mm at 3 o'clock postion. No mammographic evidence of breast malignancy bilaterally. Palpable area of concern at 12 o'clock, but no suspicious findings at this location on [place].       06/23/2012 Initial Diagnosis    Cancer of lower-inner quadrant of RIGHT female breast      06/26/2012 Breast MRI    Right breast w/clumped NME at 2 o'clock position measuring 0.8 x 1.6 x 2.2 cm located anterior/superior to known cancer. Also 6 mm ill-defined ovoid mass posterior lateral to bx-proven ca, lying superficial pect. muscle. Likely multifocal disease.       07/03/2012 Echocardiogram    Pre-treatment EF: 55-60%      07/03/2012 Imaging    CT c/a/p: No evidence of metastatic disease.       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  Also heteroxygous for 2 likely benign variants on BRCA1 and NBN.  Remainder of genes in GeneDX panel negative.       07/12/2012 Procedure    Right breast needle core biopsy: Benign breast tissue with focal pseudoangiomatous stromal hyperplasic (PASH). No evidence of malignancy.       07/17/2012 - 08/28/2012 Neo-Adjuvant Chemotherapy    Adriamycin & Cytoxan x 4 cycles completed.       09/11/2012 - 10/23/2012 Neo-Adjuvant Chemotherapy    Taxol/Carboplatin weekly x 7 cycles (stopped due to peripheral neuropathy; changed to Gemcitabine/Carbo for additional 3 cycles).      11/06/2012 - 12/04/2012 Neo-Adjuvant Chemotherapy   Gemcitabine/Carboplatin x 3 cycles completed.       11/24/2012 Breast MRI    Previous right breast multifocal malignancy with complete imaging resolution.  Stable 6 mm hemangioma or cyst in liver.       01/12/2013 Surgery    Right axillary SLNB ([place]):  3 SLNs negative.  Port also removed on this date.       03/20/2013 Definitive Surgery    Right skin-sparing total mastectomy Barry Dienes) with immediate reconstruction: No atypia or malignancy identified.  Benign skin & nipple. s/p neoadjuvant therapy.  Represents a complete response. Placement of tissue expanders.       03/20/2013 Pathologic Stage    ypT0; No evidence of cancer.       07/24/2013 Surgery    LEFT simple mastectomy with immediate reconstruction; showed ALH and fibroadenoma. Placement of tissue expanders.       01/07/2014 Surgery    Bilateral breast reconstruction ([place]): Removal of bilateral tissue expanders and placement of bilateral silicone breast implants.       06/03/2014 Survivorship    Survivorship Care Plan given to patient and reveiwed with her during in-person visit.         INTERVAL HISTORY:  Jaime Murillo presents to the Survivorship Clinic today for routine follow-up for her history of breast cancer.  Overall, she reports feeling quite well. ***    REVIEW OF SYSTEMS:  Review of Systems - Oncology  Breast: Denies any new nodularity, masses, tenderness, nipple changes, or nipple discharge.       PAST MEDICAL/SURGICAL HISTORY:  Past Medical History:  Diagnosis Date  . Anemia    takes Ferrous Sulfate daily  . Anxiety   . Breast cancer ([place]) 2014   triple negative  . Depression    takes Cymbalta daily  . Dizziness    r/t meds  . Herpes   . History of blood transfusion    no abnormal reaction noted  . HSV infection    takes Valtrex daily  . Hypertension    takes HCTZ daily  . Insomnia    takes Ambien nightly as needed  . Joint pain   . Nausea    takes Compazine and Zofran as needed    . Neuropathy    takes Lyrica daily and alternates with Gabapentin  . 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 Nocturia   . PONV (postoperative nausea and vomiting)    shaking after anesthesia; had n/v after mastectomy  . Urinary frequency   . Vision blurred 02/2013  . Wears glasses    Past Surgical History:  Procedure Laterality Date  . BREAST LUMPECTOMY Right 01/12/2013   Procedure: RIGHT SENTINEL LYMPH  NODE BIOPSY;  Surgeon: Stark Klein, MD;  Location: [facility];  Service: General;  Laterality: Right;  RIGHT SENTINEL LYMPH NODE BIOPSY  . BREAST RECONSTRUCTION WITH PLACEMENT OF TISSUE EXPANDER AND FLEX HD (ACELLULAR HYDRATED DERMIS) Right 03/20/2013   Procedure: IMMEDIATE RIGHT BREAST RECONSTRUCTION WITH PLACEMENT OF TISSUE EXPANDER AND FLEX HD (ACELLULAR HYDRATED DERMIS);  Surgeon: Irene Limbo, MD;  Location: [facility];  Service: Plastics;  Laterality: Right;  . BREAST RECONSTRUCTION WITH PLACEMENT OF TISSUE EXPANDER AND FLEX HD (ACELLULAR HYDRATED DERMIS) Left 07/24/2013   Procedure: BREAST RECONSTRUCTION WITH PLACEMENT OF TISSUE EXPANDER AND FLEX HD (ACELLULAR HYDRATED DERMIS) TO THE LEFT BREAST;  Surgeon: Irene Limbo, MD;  Location: [facility];  Service: Plastics;  Laterality: Left;  . BREAST RECONSTRUCTION WITH PLACEMENT OF TISSUE EXPANDER AND FLEX HD (ACELLULAR HYDRATED DERMIS) Bilateral 01/07/2014   Procedure: BREAST RECONSTRUCTION WITH REMOVAL  OF TISSUE EXPANDER AND PLACEMENT  OF SILICONE IMPLANTS TO BILATERAL BREAST;  Surgeon: Irene Limbo, MD;  Location: [facility];  Service: Plastics;  Laterality: Bilateral;  . COLONOSCOPY    . MASTECTOMY Bilateral 03/20/2013 & June 2015   WITH RECONSRTUCTION          DR Barry Dienes   . PORT-A-CATH REMOVAL Left 01/12/2013   Procedure: REMOVAL PORT-A-CATH;  Surgeon: Stark Klein, MD;  Location: [facility];  Service: General;  Laterality: Left;  . PORTACATH PLACEMENT N/A 07/06/2012    Procedure: INSERTION PORT-A-CATH;  Surgeon: Stark Klein, MD;  Location: [facility];  Service: General;  Laterality: N/A;  . SIMPLE MASTECTOMY WITH AXILLARY SENTINEL NODE BIOPSY Left 07/24/2013   Procedure: SIMPLE MASTECTOMY;  Surgeon: Stark Klein, MD;  Location: [facility];  Service: General;  Laterality: Left;  . TOTAL MASTECTOMY Right 03/20/2013   Procedure: RIGHT SKIN SPARING TOTAL MASTECTOMY;  Surgeon: Stark Klein, MD;  Location: [facility];  Service: General;  Laterality: Right;  . TUBAL LIGATION  2003   LTL     ALLERGIES:  Allergies  Allergen Reactions  . Lyrica [Pregabalin] Other (See Comments)    Hallucinations and bad dreams     CURRENT MEDICATIONS:  Outpatient Encounter Medications as of 01/07/2017  Medication Sig Note  . B  Complex-C (SUPER B COMPLEX PO) Take 1 tablet by mouth daily. Reported on 07/11/2015   . ferrous sulfate 325 (65 FE) MG tablet Take 325 mg by mouth daily with breakfast. Reported on 07/11/2015 12/31/2013: Patient has stopped taking but is planning on starting back today.  . gabapentin (NEURONTIN) 300 MG capsule Take 2 capsules (600 mg total) by mouth at bedtime.   . hydrochlorothiazide (HYDRODIURIL) 25 MG tablet TAKE 1 TABLET BY MOUTH ONCE DAILY   . ibuprofen (ADVIL,MOTRIN) 600 MG tablet Take 600 mg by mouth every 6 (six) hours as needed for moderate pain. Reported on 07/11/2015   . Multiple Vitamin (MULTIVITAMIN) tablet Take 1 tablet by mouth daily. Reported on 07/11/2015   . terbinafine (LAMISIL) 250 MG tablet Take 250 mg by mouth daily. Reported on 07/11/2015 11/19/2014: Received from: External Pharmacy  . valACYclovir (VALTREX) 500 MG tablet TAKE 1 TABLET BY MOUTH ONCE DAILY   . valACYclovir (VALTREX) 500 MG tablet Take 500 mg by mouth.    No facility-administered encounter medications on file as of 01/07/2017.      ONCOLOGIC FAMILY HISTORY:  Family History  Problem Relation Age of Onset  . Heart disease Mother   . Aneurysm Mother   .  Hypertension Father   . Prostate cancer Father 31  . Hypertension Sister   . Ovarian cancer Paternal Grandmother        died in her 26s  . Stomach cancer Maternal Grandmother 77  . Cancer Cousin        paternal cousin with an unknown form of cancer    GENETIC COUNSELING/TESTING: ***  SOCIAL HISTORY:  Jaime Murillo is /single/married/divorced/widowed/separated and lives alone/with her spouse/family/friend in (city), [place].  She has (#) children and they live in (city).  Jaime Murillo is currently retired/disabled/working part-time/full-time as ***.  She denies any current or history of tobacco, alcohol, or illicit drug use.     PHYSICAL EXAMINATION:  Vital Signs: There were no vitals filed for this visit. There were no vitals filed for this visit. General: Well-nourished, well-appearing female in no acute distress.  Unaccompanied/Accompanied by***** today.   HEENT: Head is normocephalic.  Pupils equal and reactive to light. Conjunctivae clear without exudate.  Sclerae anicteric. Oral mucosa is pink, moist.  Oropharynx is pink without lesions or erythema.  Lymph: No cervical, supraclavicular, or infraclavicular lymphadenopathy noted on palpation.  Cardiovascular: Regular rate and rhythm.Marland Kitchen Respiratory: Clear to auscultation bilaterally. Chest expansion symmetric; breathing non-labored.  Breast Exam:  -Left breast: No appreciable masses on palpation. No skin redness, thickening, or peau d'orange appearance; no nipple retraction or nipple discharge; mild distortion in symmetry at previous lumpectomy site***healed scar without erythema or nodularity.  -Right breast: No appreciable masses on palpation. No skin redness, thickening, or peau d'orange appearance; no nipple retraction or nipple discharge; mild distortion in symmetry at previous lumpectomy site***healed scar without erythema or nodularity. -Axilla: No axillary adenopathy bilaterally.  GI: Abdomen soft and round; non-tender,  non-distended. Bowel sounds normoactive. No hepatosplenomegaly.   GU: Deferred.  Neuro: No focal deficits. Steady gait.  Psych: Mood and affect normal and appropriate for situation.  MSK: No focal spinal tenderness to palpation, full range of motion in bilateral upper extremities Extremities: No edema. Skin: Warm and dry.  LABORATORY DATA:  None for this visit***   DIAGNOSTIC IMAGING:  Most recent mammogram: ***    ASSESSMENT AND PLAN:  Ms.. Murillo is a pleasant 56 y.o. female with history of Stage *** right/left breast invasive ductal  carcinoma, ER+/PR+/HER2-, diagnosed in (date), treated with lumpectomy, adjuvant radiation therapy, and anti-estrogen therapy with *** beginning in (date).  She presents to the Survivorship Clinic for surveillance and routine follow-up.   1. History of breast cancer:  Jaime Murillo is currently clinically and radiographically without evidence of disease or recurrence of breast cancer. She will be due for mammogram in ***; orders placed today.  She will continue her anti-estrogen therapy with ***, with plans to continue for *** years.  She will return to the cancer center to see her medical oncologist, Dr. ***, in ***/2018.  I encouraged her to call me with any questions or concerns before her next visit at the cancer center, and I would be happy to see her sooner, if needed.    #. Problem(s) at Visit___________________.  #. Bone health:  Given Jaime Murillo's age, history of breast cancer, and her current anti-estrogen therapy with ________, she is at risk for bone demineralization. Her last DEXA scan was on **/**/20**.  In the meantime, she was encouraged to increase her consumption of foods rich in calcium, as well as increase her weight-bearing activities.  She was given education on specific food and activities to promote bone health.  #. Cancer screening:  Due to Jaime Murillo's history and her age, she should receive screening for skin cancers, colon cancer, and  ***gynecologic cancers. She was encouraged to follow-up with her PCP for appropriate cancer screenings.   #. Health maintenance and wellness promotion: Jaime Murillo was encouraged to consume 5-7 servings of fruits and vegetables per day. She was also encouraged to engage in moderate to vigorous exercise for 30 minutes per day most days of the week. She was instructed to limit her alcohol consumption and continue to abstain from tobacco use/was encouraged stop smoking.  ***    Dispo:  -Return to cancer center ***   A total of (30) minutes of face-to-face time was spent with this patient with greater than 50% of that time in counseling and care-coordination.   Gardenia Phlegm, NP Survivorship Program [facility] [phone]   Note: PRIMARY CARE PROVIDER Benito Mccreedy, [place] [phone]

## 2017-03-28 DIAGNOSIS — I10 Essential (primary) hypertension: Secondary | ICD-10-CM | POA: Diagnosis not present

## 2017-03-28 DIAGNOSIS — G620 Drug-induced polyneuropathy: Secondary | ICD-10-CM | POA: Diagnosis not present

## 2017-03-28 DIAGNOSIS — R7303 Prediabetes: Secondary | ICD-10-CM | POA: Diagnosis not present

## 2017-03-28 DIAGNOSIS — E559 Vitamin D deficiency, unspecified: Secondary | ICD-10-CM | POA: Diagnosis not present

## 2017-03-28 DIAGNOSIS — M25562 Pain in left knee: Secondary | ICD-10-CM | POA: Diagnosis not present

## 2017-03-28 DIAGNOSIS — B009 Herpesviral infection, unspecified: Secondary | ICD-10-CM | POA: Diagnosis not present

## 2017-03-28 DIAGNOSIS — E785 Hyperlipidemia, unspecified: Secondary | ICD-10-CM | POA: Diagnosis not present

## 2017-04-01 ENCOUNTER — Encounter: Payer: Self-pay | Admitting: Adult Health

## 2017-04-01 ENCOUNTER — Telehealth: Payer: Self-pay | Admitting: Adult Health

## 2017-04-01 ENCOUNTER — Inpatient Hospital Stay: Payer: Medicare Other | Attending: Adult Health | Admitting: Adult Health

## 2017-04-01 VITALS — BP 120/79 | HR 63 | Temp 97.9°F | Resp 18 | Ht 65.0 in | Wt 126.9 lb

## 2017-04-01 DIAGNOSIS — Z853 Personal history of malignant neoplasm of breast: Secondary | ICD-10-CM

## 2017-04-01 DIAGNOSIS — G629 Polyneuropathy, unspecified: Secondary | ICD-10-CM | POA: Insufficient documentation

## 2017-04-01 DIAGNOSIS — G620 Drug-induced polyneuropathy: Secondary | ICD-10-CM

## 2017-04-01 DIAGNOSIS — Z79899 Other long term (current) drug therapy: Secondary | ICD-10-CM

## 2017-04-01 DIAGNOSIS — C50311 Malignant neoplasm of lower-inner quadrant of right female breast: Secondary | ICD-10-CM

## 2017-04-01 DIAGNOSIS — Z171 Estrogen receptor negative status [ER-]: Secondary | ICD-10-CM

## 2017-04-01 DIAGNOSIS — Z9221 Personal history of antineoplastic chemotherapy: Secondary | ICD-10-CM | POA: Diagnosis not present

## 2017-04-01 NOTE — Telephone Encounter (Signed)
Gave avs and calendar for march 2020 °

## 2017-04-01 NOTE — Progress Notes (Signed)
CLINIC:  Survivorship   REASON FOR VISIT:  Routine follow-up for history of breast cancer.   BRIEF ONCOLOGIC HISTORY:    Cancer of lower-inner quadrant of RIGHT female breast   06/20/2012 Initial Biopsy    Right breast needle core biopsy (3 o'clock): Grade 3, IDC & DCIS.  ER- (0%), PR- (0%), HER2- (ratio 1.26). Ki67 95%.       06/20/2012 Breast US    Right breast with slightly irregular oval hypoechoic mass measuring 7 x 6 x 17 mm at 3 o'clock postion. No mammographic evidence of breast malignancy bilaterally. Palpable area of concern at 12 o'clock, but no suspicious findings at this location on [place].       06/23/2012 Initial Diagnosis    Cancer of lower-inner quadrant of RIGHT female breast      06/26/2012 Breast MRI    Right breast w/clumped NME at 2 o'clock position measuring 0.8 x 1.6 x 2.2 cm located anterior/superior to known cancer. Also 6 mm ill-defined ovoid mass posterior lateral to bx-proven ca, lying superficial pect. muscle. Likely multifocal disease.       07/03/2012 Echocardiogram    Pre-treatment EF: 55-60%      07/03/2012 Imaging    CT c/a/p: No evidence of metastatic disease.       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  Also heteroxygous for 2 likely benign variants on BRCA1 and NBN.  Remainder of genes in GeneDX panel negative.       07/12/2012 Procedure    Right breast needle core biopsy: Benign breast tissue with focal pseudoangiomatous stromal hyperplasic (PASH). No evidence of malignancy.       07/17/2012 - 08/28/2012 Neo-Adjuvant Chemotherapy    Adriamycin & Cytoxan x 4 cycles completed.       09/11/2012 - 10/23/2012 Neo-Adjuvant Chemotherapy    Taxol/Carboplatin weekly x 7 cycles (stopped due to peripheral neuropathy; changed to Gemcitabine/Carbo for additional 3 cycles).      11/06/2012 - 12/04/2012 Neo-Adjuvant Chemotherapy   Gemcitabine/Carboplatin x 3 cycles completed.       11/24/2012 Breast MRI    Previous right breast multifocal malignancy with complete imaging resolution.  Stable 6 mm hemangioma or cyst in liver.       01/12/2013 Surgery    Right axillary SLNB ([place]):  3 SLNs negative.  Port also removed on this date.       03/20/2013 Definitive Surgery    Right skin-sparing total mastectomy Barry Dienes) with immediate reconstruction: No atypia or malignancy identified.  Benign skin & nipple. s/p neoadjuvant therapy.  Represents a complete response. Placement of tissue expanders.       03/20/2013 Pathologic Stage    ypT0; No evidence of cancer.       07/24/2013 Surgery    LEFT simple mastectomy with immediate reconstruction; showed ALH and fibroadenoma. Placement of tissue expanders.       01/07/2014 Surgery    Bilateral breast reconstruction ([place]): Removal of bilateral tissue expanders and placement of bilateral silicone breast implants.       06/03/2014 Survivorship    Survivorship Care Plan given to patient and reveiwed with her during in-person visit.         INTERVAL HISTORY:  Ms. Shiffman presents to the Survivorship Clinic today for routine follow-up for her history of breast cancer.  Overall, she reports feeling quite well.  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  She  has fallen due to this, and typically falls once every few months.  She is taking Gabapentin for the the neuropathy.  She denies any other issues.    Glenis has questions because her a couple of women in her support group are forming a class action law suit against [employer] and she wants to know if she should join them because she got chemo.  She also requested another print out of her survivorship care plan.     REVIEW OF SYSTEMS:  Review of Systems  Constitutional: Negative for appetite change, chills, fatigue, fever and unexpected weight change.  HENT:   Negative for hearing loss,  lump/mass and trouble swallowing.   Eyes: Negative for eye problems and icterus.  Respiratory: Negative for chest tightness, cough and shortness of breath.   Cardiovascular: Negative for chest pain, leg swelling and palpitations.  Gastrointestinal: Negative for abdominal distention, abdominal pain, constipation, diarrhea, nausea and vomiting.  Endocrine: Negative for hot flashes.  Skin: Negative for itching and rash.  Neurological: Negative for dizziness, extremity weakness, headaches and numbness.  Hematological: Negative for adenopathy. Does not bruise/bleed easily.  Psychiatric/Behavioral: Negative for depression. The patient is not nervous/anxious.   Breast: Denies any new nodularity, masses, tenderness, nipple changes, or nipple discharge.       PAST MEDICAL/SURGICAL HISTORY:  Past Medical History:  Diagnosis Date  . Anemia    takes Ferrous Sulfate daily  . Anxiety   . Breast cancer ([place]) 2014   triple negative  . Depression    takes Cymbalta daily  . Dizziness    r/t meds  . Herpes   . History of blood transfusion    no abnormal reaction noted  . HSV infection    takes Valtrex daily  . Hypertension    takes HCTZ daily  . Insomnia    takes Ambien nightly as needed  . Joint pain   . Nausea    takes Compazine and Zofran as needed  . Neuropathy    takes Lyrica daily and alternates with Gabapentin  . 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 Nocturia   . PONV (postoperative nausea and vomiting)    shaking after anesthesia; had n/v after mastectomy  . Urinary frequency   . Vision blurred 02/2013  . Wears glasses    Past Surgical History:  Procedure Laterality Date  . BREAST LUMPECTOMY Right 01/12/2013   Procedure: RIGHT SENTINEL LYMPH  NODE BIOPSY;  Surgeon: Stark Klein, MD;  Location: [place];  Service: General;  Laterality: Right;  RIGHT SENTINEL LYMPH NODE BIOPSY  . BREAST RECONSTRUCTION WITH PLACEMENT OF TISSUE EXPANDER AND  FLEX HD (ACELLULAR HYDRATED DERMIS) Right 03/20/2013   Procedure: IMMEDIATE RIGHT BREAST RECONSTRUCTION WITH PLACEMENT OF TISSUE EXPANDER AND FLEX HD (ACELLULAR HYDRATED DERMIS);  Surgeon: Irene Limbo, MD;  Location: [place];  Service: Plastics;  Laterality: Right;  . BREAST RECONSTRUCTION WITH PLACEMENT OF TISSUE EXPANDER AND FLEX HD (ACELLULAR HYDRATED DERMIS) Left 07/24/2013   Procedure: BREAST RECONSTRUCTION WITH PLACEMENT OF TISSUE EXPANDER AND FLEX HD (ACELLULAR HYDRATED DERMIS) TO THE LEFT BREAST;  Surgeon: Irene Limbo, MD;  Location: [place];  Service: Plastics;  Laterality: Left;  . BREAST RECONSTRUCTION WITH PLACEMENT OF TISSUE EXPANDER AND FLEX HD (ACELLULAR HYDRATED DERMIS) Bilateral 01/07/2014   Procedure: BREAST RECONSTRUCTION WITH REMOVAL  OF TISSUE EXPANDER AND PLACEMENT  OF SILICONE IMPLANTS TO BILATERAL BREAST;  Surgeon: Irene Limbo, MD;  Location: [place];  Service: Plastics;  Laterality: Bilateral;  . COLONOSCOPY    . MASTECTOMY Bilateral 03/20/2013 & June 2015   WITH RECONSRTUCTION          DR Barry Dienes   . PORT-A-CATH REMOVAL Left 01/12/2013   Procedure: REMOVAL PORT-A-CATH;  Surgeon: Stark Klein, MD;  Location: [place];  Service: General;  Laterality: Left;  . PORTACATH PLACEMENT N/A 07/06/2012   Procedure: INSERTION PORT-A-CATH;  Surgeon: Stark Klein, MD;  Location: [place];  Service: General;  Laterality: N/A;  . SIMPLE MASTECTOMY WITH AXILLARY SENTINEL NODE BIOPSY Left 07/24/2013   Procedure: SIMPLE MASTECTOMY;  Surgeon: Stark Klein, MD;  Location: [place];  Service: General;  Laterality: Left;  . TOTAL MASTECTOMY Right 03/20/2013   Procedure: RIGHT SKIN SPARING TOTAL MASTECTOMY;  Surgeon: Stark Klein, MD;  Location: [place];  Service: General;  Laterality: Right;  . TUBAL LIGATION  2003   LTL     ALLERGIES:  Allergies  Allergen Reactions  . Lyrica [Pregabalin] Other (See Comments)    Hallucinations and bad  dreams     CURRENT MEDICATIONS:  Outpatient Encounter Medications as of 04/01/2017  Medication Sig Note  . B Complex-C (SUPER B COMPLEX PO) Take 1 tablet by mouth daily. Reported on 07/11/2015   . ferrous sulfate 325 (65 FE) MG tablet Take 325 mg by mouth daily with breakfast. Reported on 07/11/2015 12/31/2013: Patient has stopped taking but is planning on starting back today.  . gabapentin (NEURONTIN) 300 MG capsule Take 2 capsules (600 mg total) by mouth at bedtime.   . hydrochlorothiazide (HYDRODIURIL) 25 MG tablet TAKE 1 TABLET BY MOUTH ONCE DAILY   . ibuprofen (ADVIL,MOTRIN) 600 MG tablet Take 600 mg by mouth every 6 (six) hours as needed for moderate pain. Reported on 07/11/2015   . Multiple Vitamin (MULTIVITAMIN) tablet Take 1 tablet by mouth daily. Reported on 07/11/2015   . terbinafine (LAMISIL) 250 MG tablet Take 250 mg by mouth daily. Reported on 07/11/2015 11/19/2014: Received from: External Pharmacy  . valACYclovir (VALTREX) 500 MG tablet TAKE 1 TABLET BY MOUTH ONCE DAILY   . valACYclovir (VALTREX) 500 MG tablet Take 500 mg by mouth.   . [DISCONTINUED] prochlorperazine (COMPAZINE) 10 MG tablet Take 1 tablet (10 mg total) by mouth every 6 (six) hours as needed (Nausea or vomiting).    No facility-administered encounter medications on file as of 04/01/2017.      ONCOLOGIC FAMILY HISTORY:  Family History  Problem Relation Age of Onset  . Heart disease Mother   . Aneurysm Mother   . Hypertension Father   . Prostate cancer Father 29  . Hypertension Sister   . Ovarian cancer Paternal Grandmother        died in her 1s  . Stomach cancer Maternal Grandmother 10  . Cancer Cousin        paternal cousin with an unknown form of cancer    GENETIC COUNSELING/TESTING: See above  SOCIAL HISTORY:  Social History   Socioeconomic History  . Marital status: Single    Spouse name: Not on file  . Number of children: 2  . Years of education: 67  . Highest education level: Not on file    Social Needs  . Financial resource strain: Not on file  . Food insecurity - worry: Not on file  . Food insecurity - inability: Not on file  . Transportation needs - medical: Not on file  . Transportation needs - non-medical: Not on file  Occupational History  Comment: Disabled  Tobacco Use  . Smoking status: Never Smoker  . Smokeless tobacco: Never Used  Substance and Sexual Activity  . Alcohol use: No    Alcohol/week: 0.0 oz  . Drug use: No  . Sexual activity: Yes    Birth control/protection: Surgical    Comment: BTL  Other Topics Concern  . Not on file  Social History Narrative   Patient lives at home with her son she is single.   Disabled.   Education- One year of college.   Right handed.   Caffeine- One cup daily.       PHYSICAL EXAMINATION:  Vital Signs: Vitals:   04/01/17 1045  BP: 120/79  Pulse: 63  Resp: 18  Temp: 97.9 F (36.6 C)  SpO2: 100%   Filed Weights   04/01/17 1045  Weight: 126 lb 14.4 oz (57.6 kg)   General: Well-nourished, well-appearing female in no acute distress.  Unaccompanied today.   HEENT: Head is normocephalic.  Pupils equal and reactive to light. Conjunctivae clear without exudate.  Sclerae anicteric. Oral mucosa is pink, moist.  Oropharynx is pink without lesions or erythema.  Lymph: No cervical, supraclavicular, or infraclavicular lymphadenopathy noted on palpation.  Cardiovascular: Regular rate and rhythm.Marland Kitchen Respiratory: Clear to auscultation bilaterally. Chest expansion symmetric; breathing non-labored.  Breast Exam:  -Breasts: bilateral breasts s/p mastectomy and reconstruction, no nodules, swelling noted -Axilla: No axillary adenopathy bilaterally.  GI: Abdomen soft and round; non-tender, non-distended. Bowel sounds normoactive. No hepatosplenomegaly.   GU: Deferred.  Neuro: No focal deficits. Steady gait.  Psych: Mood and affect normal and appropriate for situation.  MSK: No focal spinal tenderness to palpation, full range  of motion in bilateral upper extremities Extremities: No edema. Skin: Warm and dry.  LABORATORY DATA:  None for this visit   DIAGNOSTIC IMAGING:  Most recent mammogram:   N/a s/p bilateral mastecomies    ASSESSMENT AND PLAN:  Ms.. Waddle is a pleasant 57 y.o. female with history of Stage IA right breast invasive ductal carcinoma, ER-/PR-/HER2-, diagnosed in 06/2012, treated with neoadjuvant chemotherapy and bilateral mastectomies.  She presents to the Survivorship Clinic for surveillance and routine follow-up.   1. History of breast cancer:  Ms. Bry is currently clinically and radiographically without evidence of disease or recurrence of breast cancer. She will return to the cancer center in one year for routine surveillance and monitoring.  I encouraged her to call me with any questions or concerns before her next visit at the cancer center, and I would be happy to see her sooner, if needed.    2. Cancer screening:  Due to Ms. Salinas's history and her age, she should receive screening for skin cancers, colon cancer, and gynecologic cancers. She was encouraged to follow-up with her PCP for appropriate cancer screenings.   3. Health maintenance and wellness promotion: Ms. Kirtley was encouraged to consume 5-7 servings of fruits and vegetables per day. She was also encouraged to engage in moderate to vigorous exercise for 30 minutes per day most days of the week. She was instructed to limit her alcohol consumption and continue to abstain from tobacco use.    Dispo:  -Return to cancer center in one year for LTS follow up   A total of (30) minutes of face-to-face time was spent with this patient with greater than 50% of that time in counseling and care-coordination.   Gardenia Phlegm, NP Survivorship Program [place] [phone]   Note: PRIMARY CARE PROVIDER Benito Mccreedy,  MD [phone]

## 2017-05-09 DIAGNOSIS — M25562 Pain in left knee: Secondary | ICD-10-CM | POA: Diagnosis not present

## 2017-05-09 DIAGNOSIS — R7303 Prediabetes: Secondary | ICD-10-CM | POA: Diagnosis not present

## 2017-05-09 DIAGNOSIS — E559 Vitamin D deficiency, unspecified: Secondary | ICD-10-CM | POA: Diagnosis not present

## 2017-05-09 DIAGNOSIS — I10 Essential (primary) hypertension: Secondary | ICD-10-CM | POA: Diagnosis not present

## 2017-05-09 DIAGNOSIS — B009 Herpesviral infection, unspecified: Secondary | ICD-10-CM | POA: Diagnosis not present

## 2017-05-09 DIAGNOSIS — E785 Hyperlipidemia, unspecified: Secondary | ICD-10-CM | POA: Diagnosis not present

## 2017-05-09 DIAGNOSIS — G620 Drug-induced polyneuropathy: Secondary | ICD-10-CM | POA: Diagnosis not present

## 2017-05-11 ENCOUNTER — Telehealth: Payer: Self-pay | Admitting: *Deleted

## 2017-05-11 DIAGNOSIS — R7303 Prediabetes: Secondary | ICD-10-CM | POA: Diagnosis not present

## 2017-05-11 DIAGNOSIS — B009 Herpesviral infection, unspecified: Secondary | ICD-10-CM | POA: Diagnosis not present

## 2017-05-11 DIAGNOSIS — E559 Vitamin D deficiency, unspecified: Secondary | ICD-10-CM | POA: Diagnosis not present

## 2017-05-11 DIAGNOSIS — E785 Hyperlipidemia, unspecified: Secondary | ICD-10-CM | POA: Diagnosis not present

## 2017-05-11 DIAGNOSIS — I10 Essential (primary) hypertension: Secondary | ICD-10-CM | POA: Diagnosis not present

## 2017-05-11 DIAGNOSIS — R252 Cramp and spasm: Secondary | ICD-10-CM | POA: Diagnosis not present

## 2017-05-11 DIAGNOSIS — G620 Drug-induced polyneuropathy: Secondary | ICD-10-CM | POA: Diagnosis not present

## 2017-05-11 NOTE — Telephone Encounter (Signed)
This RN returned call to pt per her call to scheduling requesting an appointment due to " severe pain occurring ".  Kyani states she came home from work " feeling a little dizzy " - she ate supper and went to bed early at 20.  She woke at 1230 am " with a pain like a charley horse in my upper left leg- " " I drank some mustard water and pickle juice and the pain subsided after 45 minutes - but then it moved down to my left ankle area "   " I woke up at about 3 am this time the pain was in my right leg - so I got up and took a shower but that didn't relieve it "  " then when my alarm went off at 730 this morning and I got up the pain returned "  Pt is able to ambulate without difficultly.  She is approximately 5 years from breast ca dx and not on any antiestrogen therapy - she obtains gabapentin per this office only.  She states she had fallen about a month ago on her right knee and when she went back to her primary MD this past Monday - her b/p was elevated " like 150/100".  [facility] is [facility].  She states nothing additional was done for elevated b/p and she is on hydrochlorothiazide per primary. No noted lasix for potassium supplement.  " I was going to go to the Urgent Care but then my friend said to call the cancer office "  This RN discussed above - including likely not cancer related - but possibly secondary to b/p medications and electrolyte imbalance.  Plan per above given by this RN is for pt to contact her primary MD who is prescribing the HCTZ for appropriate follow up.  Vanisha verbalized understanding and is in agreement to this advice.

## 2017-05-16 DIAGNOSIS — B009 Herpesviral infection, unspecified: Secondary | ICD-10-CM | POA: Diagnosis not present

## 2017-05-16 DIAGNOSIS — E559 Vitamin D deficiency, unspecified: Secondary | ICD-10-CM | POA: Diagnosis not present

## 2017-05-16 DIAGNOSIS — R7303 Prediabetes: Secondary | ICD-10-CM | POA: Diagnosis not present

## 2017-05-16 DIAGNOSIS — E785 Hyperlipidemia, unspecified: Secondary | ICD-10-CM | POA: Diagnosis not present

## 2017-05-16 DIAGNOSIS — I10 Essential (primary) hypertension: Secondary | ICD-10-CM | POA: Diagnosis not present

## 2017-05-16 DIAGNOSIS — R252 Cramp and spasm: Secondary | ICD-10-CM | POA: Diagnosis not present

## 2017-05-16 DIAGNOSIS — G620 Drug-induced polyneuropathy: Secondary | ICD-10-CM | POA: Diagnosis not present

## 2017-06-03 DIAGNOSIS — H524 Presbyopia: Secondary | ICD-10-CM | POA: Diagnosis not present

## 2017-06-03 DIAGNOSIS — H5213 Myopia, bilateral: Secondary | ICD-10-CM | POA: Diagnosis not present

## 2017-08-24 DIAGNOSIS — G620 Drug-induced polyneuropathy: Secondary | ICD-10-CM | POA: Diagnosis not present

## 2017-08-24 DIAGNOSIS — B009 Herpesviral infection, unspecified: Secondary | ICD-10-CM | POA: Diagnosis not present

## 2017-08-24 DIAGNOSIS — E559 Vitamin D deficiency, unspecified: Secondary | ICD-10-CM | POA: Diagnosis not present

## 2017-08-24 DIAGNOSIS — R7303 Prediabetes: Secondary | ICD-10-CM | POA: Diagnosis not present

## 2017-08-24 DIAGNOSIS — E785 Hyperlipidemia, unspecified: Secondary | ICD-10-CM | POA: Diagnosis not present

## 2017-08-24 DIAGNOSIS — I10 Essential (primary) hypertension: Secondary | ICD-10-CM | POA: Diagnosis not present

## 2017-08-24 DIAGNOSIS — Z0000 Encounter for general adult medical examination without abnormal findings: Secondary | ICD-10-CM | POA: Diagnosis not present

## 2017-08-25 ENCOUNTER — Other Ambulatory Visit: Payer: Self-pay | Admitting: Podiatry

## 2017-08-25 ENCOUNTER — Other Ambulatory Visit: Payer: Self-pay

## 2017-08-25 ENCOUNTER — Encounter: Payer: Self-pay | Admitting: Podiatry

## 2017-08-25 ENCOUNTER — Ambulatory Visit: Payer: Medicare Other | Admitting: Podiatry

## 2017-08-25 ENCOUNTER — Ambulatory Visit (INDEPENDENT_AMBULATORY_CARE_PROVIDER_SITE_OTHER): Payer: Medicare Other | Admitting: Podiatry

## 2017-08-25 ENCOUNTER — Ambulatory Visit (INDEPENDENT_AMBULATORY_CARE_PROVIDER_SITE_OTHER): Payer: Medicare Other

## 2017-08-25 VITALS — BP 119/73 | HR 63

## 2017-08-25 DIAGNOSIS — M779 Enthesopathy, unspecified: Secondary | ICD-10-CM

## 2017-08-25 DIAGNOSIS — M2042 Other hammer toe(s) (acquired), left foot: Secondary | ICD-10-CM

## 2017-08-25 DIAGNOSIS — G620 Drug-induced polyneuropathy: Secondary | ICD-10-CM | POA: Diagnosis not present

## 2017-08-25 DIAGNOSIS — M205X1 Other deformities of toe(s) (acquired), right foot: Secondary | ICD-10-CM | POA: Diagnosis not present

## 2017-08-25 DIAGNOSIS — M659 Synovitis and tenosynovitis, unspecified: Secondary | ICD-10-CM | POA: Diagnosis not present

## 2017-08-25 DIAGNOSIS — M2012 Hallux valgus (acquired), left foot: Secondary | ICD-10-CM

## 2017-08-25 DIAGNOSIS — L603 Nail dystrophy: Secondary | ICD-10-CM | POA: Diagnosis not present

## 2017-08-25 DIAGNOSIS — M21612 Bunion of left foot: Secondary | ICD-10-CM

## 2017-08-25 DIAGNOSIS — B351 Tinea unguium: Secondary | ICD-10-CM

## 2017-08-25 DIAGNOSIS — M79671 Pain in right foot: Secondary | ICD-10-CM

## 2017-08-25 DIAGNOSIS — T451X5A Adverse effect of antineoplastic and immunosuppressive drugs, initial encounter: Secondary | ICD-10-CM

## 2017-08-25 NOTE — Patient Instructions (Addendum)
Place 1/4 cup of epsom salts in a quart of warm tap water.  Submerge your foot or feet in the solution and soak for 20 minutes.  This soak should be done twice a day.  Next, remove your foot or feet from solution, blot dry the affected area. Apply ointment and cover if instructed by your doctor.   IF YOUR SKIN BECOMES IRRITATED WHILE USING THESE INSTRUCTIONS, IT IS OKAY TO SWITCH TO  WHITE VINEGAR AND WATER.  As another alternative soak, you may use antibacterial soap and water.  Monitor for any signs/symptoms of infection. Call the office immediately if any occur or go directly to the emergency room. Call with any questions/concerns.  Bunion A bunion is a bump on the base of the big toe that forms when the bones of the big toe joint move out of position. Bunions may be small at first, but they often get larger over time. The can make walking painful. What are the causes? A bunion may be caused by:  Wearing narrow or pointed shoes that force the big toe to press against the other toes.  Abnormal foot development that causes the foot to roll inward (pronate).  Changes in the foot that are caused by certain diseases, such as rheumatoid arthritis and polio.  A foot injury.  What increases the risk? The following factors may make you more likely to develop this condition:  Wearing shoes that squeeze the toes together.  Having certain diseases, such as: ? Rheumatoid arthritis. ? Polio. ? Cerebral palsy.  Having family members who have bunions.  Being born with a foot deformity, such as flat feet or low arches.  Doing activities that put a lot of pressure on the feet, such as ballet dancing.  What are the signs or symptoms? The main symptom of a bunion is a noticeable bump on the big toe. Other symptoms may include:  Pain.  Swelling around the big toe.  Redness and inflammation.  Thick or hardened skin on the big toe or between the toes.  Stiffness or loss of motion in the  big toe.  Trouble with walking.  How is this diagnosed? A bunion may be diagnosed based on your symptoms, medical history, and activities. You may have tests, such as:  X-rays. These allow your health care provider to check the position of the bones in your foot and look for damage to your joint. They also help your health care provider to determine the severity of your bunion and the best way to treat it.  Joint aspiration. In this test, a sample of fluid is removed from the toe joint. This test, which may be done if you are in a lot of pain, helps to rule out diseases that cause painful swelling of the joints, such as arthritis.  How is this treated? There is no cure for a bunion, but treatment can help to prevent a bunion from getting worse. Treatment depends on the severity of your symptoms. Your health care provider may recommend:  Wearing shoes that have a wide toe box.  Using bunion pads to cushion the affected area.  Taping your toes together to keep them in a normal position.  Placing a device inside your shoe (orthotics) to help reduce pressure on your toe joint.  Taking medicine to ease pain, inflammation, and swelling.  Applying heat or ice to the affected area.  Doing stretching exercises.  Surgery to remove scar tissue and move the toes back into their normal position.  This treatment is rare.  Follow these instructions at home:  Support your toe joint with proper footwear, shoe padding, or taping as told by your health care provider.  Take over-the-counter and prescription medicines only as told by your health care provider.  If directed, apply ice to the injured area: ? Put ice in a plastic bag. ? Place a towel between your skin and the bag. ? Leave the ice on for 20 minutes, 2-3 times per day.  If directed, apply heat to the affected area before you exercise. Use the heat source that your health care provider recommends, such as a moist heat pack or a heating  pad. ? Place a towel between your skin and the heat source. ? Leave the heat on for 20-30 minutes. ? Remove the heat if your skin turns bright red. This is especially important if you are unable to feel pain, heat, or cold. You may have a greater risk of getting burned.  Do exercises as told by your health care provider.  Keep all follow-up visits as told by your health care provider. Contact a health care provider if:  Your symptoms get worse.  Your symptoms do not improve in 2 weeks. Get help right away if:  You have severe pain and trouble with walking. This information is not intended to replace advice given to you by your health care provider. Make sure you discuss any questions you have with your health care provider. Document Released: 01/11/2005 Document Revised: 06/19/2015 Document Reviewed: 08/11/2014 Elsevier Interactive Patient Education  2018 [place].  Hallux Rigidus Hallux rigidus is a type of joint pain or joint disease (arthritis) that affects your big toe (hallux). This condition involves the joint that connects the base of your big toe to the main part of your foot (metatarsophalangeal joint). This condition can cause your big toe to become stiff, painful, and difficult to move. Symptoms may get worse with movement or in cold or damp weather. The condition also gets worse over time. What are the causes? This condition may be caused by having a foot that does not function the way that it should or has an abnormal shape (structural deformity). These foot problems can run in families (be hereditary). This condition can also be caused by:  Injury.  Overuse.  Certain inflammatory diseases, including gout and rheumatoid arthritis.  What increases the risk? This condition is more likely to develop in people who:  Have a foot bone (metatarsal) that is longer or higher than normal.  Have a family history of hallux rigidus.  Have previously injured their big  toe.  Have feet that do not have a curve (arch) on the inner side of the foot. This may be called flat feet or fallen arches.  Turn their ankles in when they walk (pronation).  Have rheumatoid arthritis or gout.  Have to stoop down often at work.  What are the signs or symptoms? Symptoms of this condition include:  Big toe pain.  Stiffness and difficulty moving the big toe.  Swelling of the toe and surrounding area.  Bone spurs. These are bony growths that can form on the joint of the big toe.  A limp.  How is this diagnosed? This condition is diagnosed based on a medical history and physical exam. This may include X-rays. How is this treated? Treatment for this condition includes:  Wearing roomy, comfortable shoes that have a large toe box.  Putting orthotic devices in your shoes.  Pain medicines.  Physical therapy.  Icing the injured area.  Alternate between putting your foot in cold water then warm water.  If your condition is severe, treatment may include:  Corticosteroid injections to relieve pain.  Surgery to remove bone spurs, fuse damaged bones together, or replace the entire joint.  Follow these instructions at home:  Take over-the-counter and prescription medicines only as told by your health care provider.  Do not wear high heels or other restrictive footwear. Wear comfortable, supportive shoes that have a large toe box.  Wear orthotics as told by your health care provider, if this applies.  Put your feet in cold water for 30 seconds, then in warm water for 30 seconds. Alternate between the cold and warm water for 5 minutes. Do this several times a day or as told by your health care provider.  If directed, apply ice to the injured area. ? Put ice in a plastic bag. ? Place a towel between your skin and the bag. ? Leave the ice on for 20 minutes, 2-3 times per day.  Do foot exercises as instructed by your health care provider or a physical  therapist.  Keep all follow-up visits as told by your health care provider. This is important. Contact a health care provider if:  You notice bone spurs or growths on or around your big toe.  Your pain does not get better or it gets worse.  You have pain while resting.  You have pain in other parts of your body, such as your back, hip, or knee.  You start to limp. This information is not intended to replace advice given to you by your health care provider. Make sure you discuss any questions you have with your health care provider. Document Released: 01/11/2005 Document Revised: 06/19/2015 Document Reviewed: 09/18/2014 Elsevier Interactive Patient Education  [employer].

## 2017-08-25 NOTE — Progress Notes (Signed)
Subjective:  Patient ID: Jaime Murillo, female    DOB: 1960-09-20,  MRN: [id number]  Chief Complaint  Patient presents with  . Foot Pain    right foot around great toe area; pt stated, "can walk all day but pain comes when shoes are removed after a long day"  . Nail Problem    right foot great toe; pt stated, "chemo made nails come off, but right one grew back really thick"   57 y.o. female presents with the above complaint.  To subarachnoid today first complaint is pain around the great toe joint.  States that she can walk all day the pain comes when she takes her shoes off at the end of a long day.  Reports that the feeling of stiffness in the toe.  Present for many months.  Also reports a history of nail change after chemotherapy.  Patient had chemotherapy in 2014 for breast cancer states that her nails came off and her back except the right great toenail never grew right after this.  States that the nail is thick and uncomfortable and bothers her when she wears shoes.  Past Medical History:  Diagnosis Date  . Anemia    takes Ferrous Sulfate daily  . Anxiety   . Breast cancer ([place]) 2014   triple negative  . Depression    takes Cymbalta daily  . Dizziness    r/t meds  . Herpes   . History of blood transfusion    no abnormal reaction noted  . HSV infection    takes Valtrex daily  . Hypertension    takes HCTZ daily  . Insomnia    takes Ambien nightly as needed  . Joint pain   . Nausea    takes Compazine and Zofran as needed  . Neuropathy    takes Lyrica daily and alternates with Gabapentin  . 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 Nocturia   . PONV (postoperative nausea and vomiting)    shaking after anesthesia; had n/v after mastectomy  . Urinary frequency   . Vision blurred 02/2013  . Wears glasses    Past Surgical History:  Procedure Laterality Date  . BREAST LUMPECTOMY Right 01/12/2013   Procedure: RIGHT SENTINEL LYMPH  NODE BIOPSY;  Surgeon:  Stark Klein, MD;  Location: [place] [facility];  Service: General;  Laterality: Right;  RIGHT SENTINEL LYMPH NODE BIOPSY  . BREAST RECONSTRUCTION WITH PLACEMENT OF TISSUE EXPANDER AND FLEX HD (ACELLULAR HYDRATED DERMIS) Right 03/20/2013   Procedure: IMMEDIATE RIGHT BREAST RECONSTRUCTION WITH PLACEMENT OF TISSUE EXPANDER AND FLEX HD (ACELLULAR HYDRATED DERMIS);  Surgeon: Irene Limbo, MD;  Location: [place];  Service: Plastics;  Laterality: Right;  . BREAST RECONSTRUCTION WITH PLACEMENT OF TISSUE EXPANDER AND FLEX HD (ACELLULAR HYDRATED DERMIS) Left 07/24/2013   Procedure: BREAST RECONSTRUCTION WITH PLACEMENT OF TISSUE EXPANDER AND FLEX HD (ACELLULAR HYDRATED DERMIS) TO THE LEFT BREAST;  Surgeon: Irene Limbo, MD;  Location: [facility];  Service: Plastics;  Laterality: Left;  . BREAST RECONSTRUCTION WITH PLACEMENT OF TISSUE EXPANDER AND FLEX HD (ACELLULAR HYDRATED DERMIS) Bilateral 01/07/2014   Procedure: BREAST RECONSTRUCTION WITH REMOVAL  OF TISSUE EXPANDER AND PLACEMENT  OF SILICONE IMPLANTS TO BILATERAL BREAST;  Surgeon: Irene Limbo, MD;  Location: [place];  Service: Plastics;  Laterality: Bilateral;  . COLONOSCOPY    . MASTECTOMY Bilateral 03/20/2013 & June 2015   WITH RECONSRTUCTION          DR  BYERLY   . PORT-A-CATH REMOVAL Left 01/12/2013   Procedure: REMOVAL PORT-A-CATH;  Surgeon: Stark Klein, MD;  Location: [place];  Service: General;  Laterality: Left;  . PORTACATH PLACEMENT N/A 07/06/2012   Procedure: INSERTION PORT-A-CATH;  Surgeon: Stark Klein, MD;  Location: [place];  Service: General;  Laterality: N/A;  . SIMPLE MASTECTOMY WITH AXILLARY SENTINEL NODE BIOPSY Left 07/24/2013   Procedure: SIMPLE MASTECTOMY;  Surgeon: Stark Klein, MD;  Location: [facility];  Service: General;  Laterality: Left;  . TOTAL MASTECTOMY Right 03/20/2013   Procedure: RIGHT SKIN SPARING TOTAL MASTECTOMY;  Surgeon: Stark Klein, MD;  Location: [facility];   Service: General;  Laterality: Right;  . TUBAL LIGATION  2003   LTL    Current Outpatient Medications:  .  B Complex-C (SUPER B COMPLEX PO), Take 1 tablet by mouth daily. Reported on 07/11/2015, Disp: , Rfl:  .  ferrous sulfate 325 (65 FE) MG tablet, Take 325 mg by mouth daily with breakfast. Reported on 07/11/2015, Disp: , Rfl:  .  gabapentin (NEURONTIN) 300 MG capsule, Take 2 capsules (600 mg total) by mouth at bedtime., Disp: 180 capsule, Rfl: 2 .  hydrochlorothiazide (HYDRODIURIL) 25 MG tablet, TAKE 1 TABLET BY MOUTH ONCE DAILY, Disp: 30 tablet, Rfl: 5 .  ibuprofen (ADVIL,MOTRIN) 600 MG tablet, Take 600 mg by mouth every 6 (six) hours as needed for moderate pain. Reported on 07/11/2015, Disp: , Rfl:  .  Multiple Vitamin (MULTIVITAMIN) tablet, Take 1 tablet by mouth daily. Reported on 07/11/2015, Disp: , Rfl:  .  valACYclovir (VALTREX) 500 MG tablet, Take 500 mg by mouth., Disp: , Rfl:   Allergies  Allergen Reactions  . Lyrica [Pregabalin] Other (See Comments)    Hallucinations and bad dreams   Review of Systems: Negative except as noted in the HPI. Denies N/V/F/Ch. Objective:   Vitals:   08/25/17 1144  BP: 119/73  Pulse: 63   General AA&O x3. Normal mood and affect.  Vascular Dorsalis pedis and posterior tibial pulses  present 2+ bilaterally  Capillary refill normal to all digits. Pedal hair growth normal.  Neurologic Epicritic sensation grossly present.  Dermatologic No open lesions. Interspaces clear of maceration. Nails well groomed and normal in appearance. R hallux nail thickened, dystrophic, brown discoloration, lysis  Orthopedic: MMT 5/5 in dorsiflexion, plantarflexion, inversion, and eversion. Decreased range of motion dorsiflexion and plantarflexion right first MPJ with crepitus on range of motion.  Pain palpation about the first MPJ with pain on range of motion.  HAV deformity left first MPJ with hammertoe deformity second left toe.   Assessment & Plan:  Patient was  evaluated and treated and all questions answered.  Hallux Limitus/Rigidus R; Hallux Valgus with Bunion L, Hammertoe L 2nd toe -X-rays taken of the right foot.  Underlying joint space narrowing with spur formation but versus remote fracture.  Dorsal spurring of the first metatarsal and phalangeal joint -Discussed the patient injection therapy consisting of antifungal medicine.  Patient wishes to hold off today.  We will plan for injection in late September before her race in October -Discussed with patient major surgery in the future to correct the right foot arthritic joint.  Discussed possible cheilectomy with either metatarsal osteotomy or cartilage implant depending upon health of the joint.  Discussed that she would likely need to have more than one surgery on his foot given her relatively young age.  Patient would like to consider surgery but wants to wait until after October.  Onychodystrophy  right hallux nail -Discussed the patient that the nail may or may not grow back normally -Discussed pharmacologic management including oral antifungal medicine should the culture come back positive for fungus -Nail avulsed sent for culture and histology.  See below.  Procedure: Avulsion of toenail Location: Right 1st toe  Anesthesia: Lidocaine 1% plain; 1.5 mL and Marcaine 0.5% plain; 1.5 mL, digital block. Skin Prep: Betadine. Dressing: Silvadene; telfa; dry, sterile, compression dressing. Technique: Following skin prep, the toe was exsanguinated and a tourniquet was secured at the base of the toe. The nail was freed and avulsed with a hemostat. The area was cleansed. The tourniquet was then removed and sterile dressing applied. Disposition: Patient tolerated procedure well.   Return in about 6 weeks (around 10/06/2017) for Hallux Rigidus F/u.

## 2017-08-26 DIAGNOSIS — E785 Hyperlipidemia, unspecified: Secondary | ICD-10-CM | POA: Diagnosis not present

## 2017-08-26 DIAGNOSIS — Z136 Encounter for screening for cardiovascular disorders: Secondary | ICD-10-CM | POA: Diagnosis not present

## 2017-08-26 DIAGNOSIS — E559 Vitamin D deficiency, unspecified: Secondary | ICD-10-CM | POA: Diagnosis not present

## 2017-08-26 DIAGNOSIS — B009 Herpesviral infection, unspecified: Secondary | ICD-10-CM | POA: Diagnosis not present

## 2017-08-26 DIAGNOSIS — Z131 Encounter for screening for diabetes mellitus: Secondary | ICD-10-CM | POA: Diagnosis not present

## 2017-08-26 DIAGNOSIS — G620 Drug-induced polyneuropathy: Secondary | ICD-10-CM | POA: Diagnosis not present

## 2017-08-26 DIAGNOSIS — I10 Essential (primary) hypertension: Secondary | ICD-10-CM | POA: Diagnosis not present

## 2017-08-26 DIAGNOSIS — R7303 Prediabetes: Secondary | ICD-10-CM | POA: Diagnosis not present

## 2017-08-26 DIAGNOSIS — Z0110 Encounter for examination of ears and hearing without abnormal findings: Secondary | ICD-10-CM | POA: Diagnosis not present

## 2017-08-26 DIAGNOSIS — Z0100 Encounter for examination of eyes and vision without abnormal findings: Secondary | ICD-10-CM | POA: Diagnosis not present

## 2017-09-07 ENCOUNTER — Encounter: Payer: Self-pay | Admitting: Podiatry

## 2017-09-07 NOTE — Addendum Note (Signed)
Addended by: Wardell Heath on: 09/07/2017 02:17 PM   Modules accepted: Orders

## 2017-09-08 ENCOUNTER — Ambulatory Visit: Payer: Medicare Other | Admitting: Podiatry

## 2017-09-13 DIAGNOSIS — Z113 Encounter for screening for infections with a predominantly sexual mode of transmission: Secondary | ICD-10-CM | POA: Diagnosis not present

## 2017-09-13 DIAGNOSIS — Z124 Encounter for screening for malignant neoplasm of cervix: Secondary | ICD-10-CM | POA: Diagnosis not present

## 2017-09-13 DIAGNOSIS — Z6821 Body mass index (BMI) 21.0-21.9, adult: Secondary | ICD-10-CM | POA: Diagnosis not present

## 2017-09-13 DIAGNOSIS — Z853 Personal history of malignant neoplasm of breast: Secondary | ICD-10-CM | POA: Diagnosis not present

## 2017-09-13 DIAGNOSIS — N898 Other specified noninflammatory disorders of vagina: Secondary | ICD-10-CM | POA: Diagnosis not present

## 2017-09-13 DIAGNOSIS — B009 Herpesviral infection, unspecified: Secondary | ICD-10-CM | POA: Diagnosis not present

## 2017-09-13 DIAGNOSIS — Z01419 Encounter for gynecological examination (general) (routine) without abnormal findings: Secondary | ICD-10-CM | POA: Diagnosis not present

## 2017-09-13 DIAGNOSIS — N941 Unspecified dyspareunia: Secondary | ICD-10-CM | POA: Diagnosis not present

## 2017-09-13 DIAGNOSIS — Z01411 Encounter for gynecological examination (general) (routine) with abnormal findings: Secondary | ICD-10-CM | POA: Diagnosis not present

## 2017-10-06 ENCOUNTER — Ambulatory Visit (INDEPENDENT_AMBULATORY_CARE_PROVIDER_SITE_OTHER): Payer: Medicare Other | Admitting: Podiatry

## 2017-10-06 ENCOUNTER — Telehealth: Payer: Self-pay | Admitting: Podiatry

## 2017-10-06 DIAGNOSIS — M205X1 Other deformities of toe(s) (acquired), right foot: Secondary | ICD-10-CM

## 2017-10-06 DIAGNOSIS — M19079 Primary osteoarthritis, unspecified ankle and foot: Secondary | ICD-10-CM

## 2017-10-06 DIAGNOSIS — M21612 Bunion of left foot: Secondary | ICD-10-CM

## 2017-10-06 DIAGNOSIS — M2012 Hallux valgus (acquired), left foot: Secondary | ICD-10-CM

## 2017-10-06 NOTE — Telephone Encounter (Signed)
Patient would like to know if its ok, for her to take pain medication. She was just given an injection.

## 2017-10-06 NOTE — Telephone Encounter (Signed)
Unable to leave a message pt's mailbox is full. 

## 2017-10-06 NOTE — Progress Notes (Signed)
Subjective:  Patient ID: Jaime Murillo, female    DOB: 1960-08-09,  MRN: [id number]  Chief Complaint  Patient presents with  . Foot Pain    follow up hallux limitus right    57 y.o. female presents with the above complaint. States that the right great toe joint is hurting. 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 Still having pain in the joint and would like to consider surgery after her race in October.  Review of Systems: Negative except as noted in the HPI. Denies N/V/F/Ch.  Past Medical History:  Diagnosis Date  . Anemia    takes Ferrous Sulfate daily  . Anxiety   . Breast cancer ([place]) 2014   triple negative  . Depression    takes Cymbalta daily  . Dizziness    r/t meds  . Herpes   . History of blood transfusion    no abnormal reaction noted  . HSV infection    takes Valtrex daily  . Hypertension    takes HCTZ daily  . Insomnia    takes Ambien nightly as needed  . Joint pain   . Nausea    takes Compazine and Zofran as needed  . Neuropathy    takes Lyrica daily and alternates with Gabapentin  . 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 Nocturia   . PONV (postoperative nausea and vomiting)    shaking after anesthesia; had n/v after mastectomy  . Urinary frequency   . Vision blurred 02/2013  . Wears glasses     Current Outpatient Medications:  .  B Complex-C (SUPER B COMPLEX PO), Take 1 tablet by mouth daily. Reported on 07/11/2015, Disp: , Rfl:  .  ferrous sulfate 325 (65 FE) MG tablet, Take 325 mg by mouth daily with breakfast. Reported on 07/11/2015, Disp: , Rfl:  .  gabapentin (NEURONTIN) 300 MG capsule, Take 2 capsules (600 mg total) by mouth at bedtime., Disp: 180 capsule, Rfl: 2 .  hydrochlorothiazide (HYDRODIURIL) 25 MG tablet, TAKE 1 TABLET BY MOUTH ONCE DAILY, Disp: 30 tablet, Rfl: 5 .  ibuprofen (ADVIL,MOTRIN) 600 MG tablet, Take 600 mg by mouth every 6 (six) hours as needed for moderate pain. Reported on 07/11/2015, Disp: ,  Rfl:  .  Multiple Vitamin (MULTIVITAMIN) tablet, Take 1 tablet by mouth daily. Reported on 07/11/2015, Disp: , Rfl:  .  valACYclovir (VALTREX) 500 MG tablet, Take 500 mg by mouth., Disp: , Rfl:   Social History   Tobacco Use  Smoking Status Never Smoker  Smokeless Tobacco Never Used    Allergies  Allergen Reactions  . Lyrica [Pregabalin] Other (See Comments)    Hallucinations and bad dreams   Objective:  There were no vitals filed for this visit. There is no height or weight on file to calculate BMI. Constitutional Well developed. Well nourished.  Vascular Dorsalis pedis pulses palpable bilaterally. Posterior tibial pulses palpable bilaterally. Capillary refill normal to all digits.  No cyanosis or clubbing noted. Pedal hair growth normal.  Neurologic Normal speech. Oriented to person, place, and time. Epicritic sensation to light touch grossly present bilaterally.  Dermatologic Nails well groomed and normal in appearance. No open wounds. No skin lesions.  Orthopedic: Normal joint ROM without pain or crepitus bilaterally. No visible deformities. Pain on ROM of 1st MPJ. Dorsal osteophyte   Radiographs: None today Assessment:   1. Hallux limitus, right   2. Hallux valgus with bunions of left foot  Plan:  Patient was evaluated and treated and all questions answered.  Hallux Limitus R -Injection as below. -Will further discuss surgery at next visit as it will be after her race. Will plan for surgery to be completed over her December school break  Procedure: Joint Injection Location: Right 1st MPJ joint Skin Prep: Alcohol. Injectate: 0.5 cc 1% lidocaine plain, 0.5 cc dexamethasone phosphate. Disposition: Patient tolerated procedure well. Injection site dressed with a band-aid.   Return in about 6 weeks (around 11/17/2017) for Hallux LImitus R.

## 2017-10-06 NOTE — Telephone Encounter (Signed)
I informed pt she had received a steroid injection and could take OTC pain medication as needed after the injection.

## 2017-10-06 NOTE — Telephone Encounter (Signed)
I just saw Dr. March Rummage this morning and he gave me an injection in my right big toe. I wanted to know if it was okay to take some Advil, tylenol or something for the discomfort I'm experiencing. Please give me a call back at [phone]. Thank you.

## 2017-10-07 ENCOUNTER — Ambulatory Visit: Payer: Medicare Other | Admitting: Podiatry

## 2017-11-17 ENCOUNTER — Other Ambulatory Visit: Payer: Self-pay | Admitting: Plastic Surgery

## 2017-11-17 ENCOUNTER — Ambulatory Visit (INDEPENDENT_AMBULATORY_CARE_PROVIDER_SITE_OTHER): Payer: Medicare Other | Admitting: Podiatry

## 2017-11-17 DIAGNOSIS — M19079 Primary osteoarthritis, unspecified ankle and foot: Secondary | ICD-10-CM | POA: Diagnosis not present

## 2017-11-17 DIAGNOSIS — M205X1 Other deformities of toe(s) (acquired), right foot: Secondary | ICD-10-CM

## 2017-11-17 DIAGNOSIS — M2012 Hallux valgus (acquired), left foot: Secondary | ICD-10-CM

## 2017-11-17 DIAGNOSIS — M7751 Other enthesopathy of right foot: Secondary | ICD-10-CM

## 2017-11-17 DIAGNOSIS — Z08 Encounter for follow-up examination after completed treatment for malignant neoplasm: Secondary | ICD-10-CM | POA: Diagnosis not present

## 2017-11-17 DIAGNOSIS — M21612 Bunion of left foot: Secondary | ICD-10-CM | POA: Diagnosis not present

## 2017-11-17 DIAGNOSIS — N644 Mastodynia: Secondary | ICD-10-CM

## 2017-11-17 DIAGNOSIS — Z9013 Acquired absence of bilateral breasts and nipples: Secondary | ICD-10-CM | POA: Diagnosis not present

## 2017-11-17 DIAGNOSIS — Z853 Personal history of malignant neoplasm of breast: Secondary | ICD-10-CM | POA: Diagnosis not present

## 2017-11-17 NOTE — Patient Instructions (Signed)
Pre-Operative Instructions  Congratulations, you have decided to take an important step towards improving your quality of life.  You can be assured that the doctors and staff at [facility] will be with you every step of the way.  Here are some important things you should know:  1. Plan to be at the surgery center/hospital at least 1 (one) hour prior to your scheduled time, unless otherwise directed by the surgical center/hospital staff.  You must have a responsible adult accompany you, remain during the surgery and drive you home.  Make sure you have directions to the surgical center/hospital to ensure you arrive on time. 2. If you are having surgery at [facility] or [facility], you will need a copy of your medical history and physical form from your family physician within one month prior to the date of surgery. We will give you a form for your primary physician to complete.  3. We make every effort to accommodate the date you request for surgery.  However, there are times where surgery dates or times have to be moved.  We will contact you as soon as possible if a change in schedule is required.   4. No aspirin/ibuprofen for one week before surgery.  If you are on aspirin, any non-steroidal anti-inflammatory medications (Mobic, Aleve, Ibuprofen) should not be taken seven (7) days prior to your surgery.  You make take Tylenol for pain prior to surgery.  5. Medications - If you are taking daily heart and blood pressure medications, seizure, reflux, allergy, asthma, anxiety, pain or diabetes medications, make sure you notify the surgery center/hospital before the day of surgery so they can tell you which medications you should take or avoid the day of surgery. 6. No food or drink after midnight the night before surgery unless directed otherwise by surgical center/hospital staff. 7. No alcoholic beverages 24-hours prior to surgery.  No smoking 24-hours prior or 24-hours after  surgery. 8. Wear loose pants or shorts. They should be loose enough to fit over bandages, boots, and casts. 9. Don't wear slip-on shoes. Sneakers are preferred. 10. Bring your boot with you to the surgery center/hospital.  Also bring crutches or a walker if your physician has prescribed it for you.  If you do not have this equipment, it will be provided for you after surgery. 11. If you have not been contacted by the surgery center/hospital by the day before your surgery, call to confirm the date and time of your surgery. 12. Leave-time from work may vary depending on the type of surgery you have.  Appropriate arrangements should be made prior to surgery with your employer. 13. Prescriptions will be provided immediately following surgery by your doctor.  Fill these as soon as possible after surgery and take the medication as directed. Pain medications will not be refilled on weekends and must be approved by the doctor. 14. Remove nail polish on the operative foot and avoid getting pedicures prior to surgery. 15. Wash the night before surgery.  The night before surgery wash the foot and leg well with water and the antibacterial soap provided. Be sure to pay special attention to beneath the toenails and in between the toes.  Wash for at least three (3) minutes. Rinse thoroughly with water and dry well with a towel.  Perform this wash unless told not to do so by your physician.  Enclosed: 1 Ice pack (please put in freezer the night before surgery)   1 Hibiclens skin cleaner     Pre-op instructions  If you have any questions regarding the instructions, please do not hesitate to call our office.  [place]: [address], [place], [place] [address] -- [phone]  [place]: [address] [place], [place] [address] -- [phone]  [place]: [address]  , [place] [address] -- [phone]  [place]: [address], [address], [place], [place] [address] -- [phone]  Website:  [email or web address] 

## 2017-11-22 NOTE — Progress Notes (Signed)
Subjective:  Patient ID: Jaime Murillo, female    DOB: 1960-04-14,  MRN: [id number]  Chief Complaint  Patient presents with  . Foot Problem    Right Hallux Limitus. Pt states pain due to walking a 5k and also wearing heels for an event, pt has some swelling.    57 y.o. female presents with the above complaint. Still having pain would like to discuss surgery.  Review of Systems: Negative except as noted in the HPI. Denies N/V/F/Ch.  Past Medical History:  Diagnosis Date  . Anemia    takes Ferrous Sulfate daily  . Anxiety   . Breast cancer ([place]) 2014   triple negative  . Depression    takes Cymbalta daily  . Dizziness    r/t meds  . Herpes   . History of blood transfusion    no abnormal reaction noted  . HSV infection    takes Valtrex daily  . Hypertension    takes HCTZ daily  . Insomnia    takes Ambien nightly as needed  . Joint pain   . Nausea    takes Compazine and Zofran as needed  . Neuropathy    takes Lyrica daily and alternates with Gabapentin  . 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 Nocturia   . PONV (postoperative nausea and vomiting)    shaking after anesthesia; had n/v after mastectomy  . Urinary frequency   . Vision blurred 02/2013  . Wears glasses     Current Outpatient Medications:  .  terbinafine (LAMISIL) 250 MG tablet, , Disp: , Rfl:  .  B Complex-C (SUPER B COMPLEX PO), Take 1 tablet by mouth daily. Reported on 07/11/2015, Disp: , Rfl:  .  ferrous sulfate 325 (65 FE) MG tablet, Take 325 mg by mouth daily with breakfast. Reported on 07/11/2015, Disp: , Rfl:  .  gabapentin (NEURONTIN) 300 MG capsule, Take 2 capsules (600 mg total) by mouth at bedtime., Disp: 180 capsule, Rfl: 2 .  hydrochlorothiazide (HYDRODIURIL) 25 MG tablet, TAKE 1 TABLET BY MOUTH ONCE DAILY, Disp: 30 tablet, Rfl: 5 .  ibuprofen (ADVIL,MOTRIN) 600 MG tablet, Take 600 mg by mouth every 6 (six) hours as needed for moderate pain. Reported on 07/11/2015, Disp: , Rfl:    .  Multiple Vitamin (MULTIVITAMIN) tablet, Take 1 tablet by mouth daily. Reported on 07/11/2015, Disp: , Rfl:  .  valACYclovir (VALTREX) 500 MG tablet, Take 500 mg by mouth., Disp: , Rfl:   Social History   Tobacco Use  Smoking Status Never Smoker  Smokeless Tobacco Never Used    Allergies  Allergen Reactions  . Lyrica [Pregabalin] Other (See Comments)    Hallucinations and bad dreams   Objective:  There were no vitals filed for this visit. There is no height or weight on file to calculate BMI. Constitutional Well developed. Well nourished.  Vascular Dorsalis pedis pulses palpable bilaterally. Posterior tibial pulses palpable bilaterally. Capillary refill normal to all digits.  No cyanosis or clubbing noted. Pedal hair growth normal.  Neurologic Normal speech. Oriented to person, place, and time. Epicritic sensation to light touch grossly present bilaterally.  Dermatologic Nails well groomed and normal in appearance. No open wounds. No skin lesions.  Orthopedic: Normal joint ROM without pain or crepitus bilaterally. No visible deformities. Pain on ROM of 1st MPJ. Dorsal osteophyte   Radiographs: None today Assessment:   1. Capsulitis of metatarsophalangeal (MTP) joint of right foot   2. Hallux limitus, right  3. Hallux valgus with bunions of left foot   4. Arthritis of first metatarsophalangeal joint    Plan:  Patient was evaluated and treated and all questions answered.  Hallux Limitus R -Injection delivered to decrease pain until Surgery  Procedure: Joint Injection Location: Right 1st MP joint Skin Prep: Alcohol. Injectate: 0.5 cc 1% lidocaine plain, 0.5 cc dexamethasone phosphate. Disposition: Patient tolerated procedure well. Injection site dressed with a band-aid.   Hallux Valgus R -Discussed possible surgical correction with patient.  Surgical pen and postop course explained at length. -Patient has failed all conservative therapy and wishes to proceed  with surgical intervention. All risks, benefits, and alternatives discussed with patient. No guarantees given. Consent reviewed and signed by patient. -Planned procedures: Right [place] bunionectomy   No follow-ups on file.

## 2017-11-23 ENCOUNTER — Telehealth: Payer: Self-pay | Admitting: *Deleted

## 2017-11-23 NOTE — Telephone Encounter (Signed)
"  I have a little problem.  I was in the office and I scheduled surgery with Dr. March Rummage on December 11.  I thought I had a final on December 9 but my final is on December 11.  I may have a hard time rescheduling it with my professor.  Is there any way that I can schedule my surgery at day or two after the 11th because that is my last final.  Please give me a call back."

## 2017-11-25 ENCOUNTER — Other Ambulatory Visit: Payer: Medicaid Other

## 2017-11-25 NOTE — Telephone Encounter (Signed)
"  I calling to see about changing my surgery date.  If I can't I just need a letter so I can give it to my professor to take my exam on another day.  Please give me a call back."

## 2017-11-28 NOTE — Telephone Encounter (Signed)
I am sorry for calling you back so late.  We had to figure out where we could schedule your surgery.  He can do it on December 12.  It will be in the afternoon.  "Okay, sounds good.  I didn't have my calendar with me when I saw him.  I appreciate it."  Someone from the surgical center will call and give you the arrival time.  You need to have that history and physical form completed by your primary care doctor.  "I will call and set that up."

## 2017-12-14 ENCOUNTER — Telehealth: Payer: Self-pay | Admitting: *Deleted

## 2017-12-14 DIAGNOSIS — E785 Hyperlipidemia, unspecified: Secondary | ICD-10-CM | POA: Diagnosis not present

## 2017-12-14 DIAGNOSIS — G620 Drug-induced polyneuropathy: Secondary | ICD-10-CM | POA: Diagnosis not present

## 2017-12-14 DIAGNOSIS — E559 Vitamin D deficiency, unspecified: Secondary | ICD-10-CM | POA: Diagnosis not present

## 2017-12-14 DIAGNOSIS — B009 Herpesviral infection, unspecified: Secondary | ICD-10-CM | POA: Diagnosis not present

## 2017-12-14 DIAGNOSIS — R7303 Prediabetes: Secondary | ICD-10-CM | POA: Diagnosis not present

## 2017-12-14 DIAGNOSIS — I10 Essential (primary) hypertension: Secondary | ICD-10-CM | POA: Diagnosis not present

## 2017-12-14 NOTE — Telephone Encounter (Signed)
"  I am here for my doctor's appointment.  I forgot my forms that I need to have completed.  Can you possibly fax the forms to my doctor?"  Yes, what is his name and fax number?  "His name is Dr. Vista Lawman.  His fax number is [phone]."  I faxed the history and physical form to Dr. Vista Lawman.

## 2017-12-16 ENCOUNTER — Telehealth: Payer: Self-pay | Admitting: *Deleted

## 2017-12-16 NOTE — Telephone Encounter (Signed)
"  I'm calling to see if you received everything you needed and to see if there's anything else I need to do."  We have not received the history and physical note yet.  You don't need to do anything else that I am aware of.  "I'll give them a call at my doctor's office.  I felt like they were trying to rush me out of there when I was there.  I don't want any surprises when it's time for me to have the surgery."  I understand.

## 2017-12-25 ENCOUNTER — Emergency Department (HOSPITAL_COMMUNITY)
Admission: EM | Admit: 2017-12-25 | Discharge: 2017-12-25 | Disposition: A | Discharge status: Home / Self Care | Payer: Medicare Other | Attending: Emergency Medicine | Admitting: Emergency Medicine

## 2017-12-25 ENCOUNTER — Emergency Department (HOSPITAL_COMMUNITY): Payer: Medicare Other

## 2017-12-25 ENCOUNTER — Encounter: Payer: Self-pay | Admitting: Emergency Medicine

## 2017-12-25 DIAGNOSIS — Z79899 Other long term (current) drug therapy: Secondary | ICD-10-CM | POA: Insufficient documentation

## 2017-12-25 DIAGNOSIS — Z853 Personal history of malignant neoplasm of breast: Secondary | ICD-10-CM | POA: Insufficient documentation

## 2017-12-25 DIAGNOSIS — I10 Essential (primary) hypertension: Secondary | ICD-10-CM | POA: Insufficient documentation

## 2017-12-25 DIAGNOSIS — R519 Headache, unspecified: Secondary | ICD-10-CM

## 2017-12-25 DIAGNOSIS — R51 Headache: Secondary | ICD-10-CM | POA: Diagnosis not present

## 2017-12-25 MED ORDER — SODIUM CHLORIDE 0.9 % IV BOLUS
500.0000 mL | Freq: Once | INTRAVENOUS | Status: AC
  Administered 2017-12-25: 500 mL via INTRAVENOUS

## 2017-12-25 MED ORDER — PROCHLORPERAZINE EDISYLATE 10 MG/2ML IJ SOLN
10.0000 mg | Freq: Once | INTRAMUSCULAR | Status: AC
  Administered 2017-12-25: 10 mg via INTRAVENOUS
  Filled 2017-12-25: qty 2

## 2017-12-25 MED ORDER — DIPHENHYDRAMINE HCL 50 MG/ML IJ SOLN
12.5000 mg | Freq: Once | INTRAMUSCULAR | Status: AC
Start: 2017-12-25 — End: 2017-12-25
  Administered 2017-12-25: 12.5 mg via INTRAVENOUS
  Filled 2017-12-25: qty 1

## 2017-12-25 NOTE — ED Notes (Signed)
Pt stable, ambulatory, states understanding of discharge instructions 

## 2017-12-25 NOTE — ED Provider Notes (Signed)
[facility] EMERGENCY DEPARTMENT Provider Note   CSN: [id number] Arrival date & time: 12/25/17  1621     History   Chief Complaint Chief Complaint  Patient presents with  . Headache    HPI Jaime Murillo is a 57 y.o. female.  The history is provided by the patient and medical records. No language interpreter was used.   Jaime Murillo is a 57 y.o. female resents the emergency department for persistent left-sided headache over the last 3 days.  She did take some over-the-counter pain medication which brought to pain down a good bit.  She denies any numbness, weakness, visual changes, slurred speech, difficulty with balance, nausea, vomiting, photophobia or any associated symptoms.  She does have a history of breast surgery and will be 5 years cancer free in a few weeks.  Patient states that she does not ever get headaches and she is quite concerned that cancer may be back.  Past Medical History:  Diagnosis Date  . Anemia    takes Ferrous Sulfate daily  . Anxiety   . 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 Depression    takes Cymbalta daily  . Dizziness    r/t meds  . Herpes   . History of blood transfusion    no abnormal reaction noted  . HSV infection    takes Valtrex daily  . Hypertension    takes HCTZ daily  . Insomnia    takes Ambien nightly as needed  . Joint pain   . Nausea    takes Compazine and Zofran as needed  . Neuropathy    takes Lyrica daily and alternates with Gabapentin  . 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 Nocturia   . PONV (postoperative nausea and vomiting)    shaking after anesthesia; had n/v after mastectomy  . Urinary frequency   . Vision blurred 02/2013  . Wears glasses     Patient Active Problem List   Diagnosis Date Noted  . Acquired absence of breast and nipple 01/07/2014  . Personal history of breast cancer 07/11/2013  . Fibroid uterus 07/04/2013  . Peripheral neuropathy 02/08/2013  .  High blood pressure   . Contact lens/glasses fitting   . Breast cancer ([place])   . Cancer of lower-inner quadrant of RIGHT female breast 06/23/2012  . HSV (herpes simplex virus) anogenital infection 06/06/2012  . Breast mass, right 06/06/2012  . HTN (hypertension) 05/05/2011    Past Surgical History:  Procedure Laterality Date  . BREAST LUMPECTOMY Right 01/12/2013   Procedure: RIGHT SENTINEL LYMPH  NODE BIOPSY;  Surgeon: Stark Klein, MD;  Location: [facility];  Service: General;  Laterality: Right;  RIGHT SENTINEL LYMPH NODE BIOPSY  . BREAST RECONSTRUCTION WITH PLACEMENT OF TISSUE EXPANDER AND FLEX HD (ACELLULAR HYDRATED DERMIS) Right 03/20/2013   Procedure: IMMEDIATE RIGHT BREAST RECONSTRUCTION WITH PLACEMENT OF TISSUE EXPANDER AND FLEX HD (ACELLULAR HYDRATED DERMIS);  Surgeon: Irene Limbo, MD;  Location: [facility];  Service: Plastics;  Laterality: Right;  . BREAST RECONSTRUCTION WITH PLACEMENT OF TISSUE EXPANDER AND FLEX HD (ACELLULAR HYDRATED DERMIS) Left 07/24/2013   Procedure: BREAST RECONSTRUCTION WITH PLACEMENT OF TISSUE EXPANDER AND FLEX HD (ACELLULAR HYDRATED DERMIS) TO THE LEFT BREAST;  Surgeon: Irene Limbo, MD;  Location: [facility];  Service: Plastics;  Laterality: Left;  . BREAST RECONSTRUCTION WITH PLACEMENT OF TISSUE EXPANDER AND FLEX HD (ACELLULAR HYDRATED DERMIS) Bilateral 01/07/2014   Procedure:  BREAST RECONSTRUCTION WITH REMOVAL  OF TISSUE EXPANDER AND PLACEMENT  OF SILICONE IMPLANTS TO BILATERAL BREAST;  Surgeon: Irene Limbo, MD;  Location: [facility];  Service: Plastics;  Laterality: Bilateral;  . COLONOSCOPY    . MASTECTOMY Bilateral 03/20/2013 & June 2015   WITH RECONSRTUCTION          DR Barry Dienes   . PORT-A-CATH REMOVAL Left 01/12/2013   Procedure: REMOVAL PORT-A-CATH;  Surgeon: Stark Klein, MD;  Location: [facility];  Service: General;  Laterality: Left;  . PORTACATH PLACEMENT N/A 07/06/2012   Procedure: INSERTION  PORT-A-CATH;  Surgeon: Stark Klein, MD;  Location: [facility];  Service: General;  Laterality: N/A;  . SIMPLE MASTECTOMY WITH AXILLARY SENTINEL NODE BIOPSY Left 07/24/2013   Procedure: SIMPLE MASTECTOMY;  Surgeon: Stark Klein, MD;  Location: [facility];  Service: General;  Laterality: Left;  . TOTAL MASTECTOMY Right 03/20/2013   Procedure: RIGHT SKIN SPARING TOTAL MASTECTOMY;  Surgeon: Stark Klein, MD;  Location: [facility];  Service: General;  Laterality: Right;  . TUBAL LIGATION  2003   LTL     OB History    Gravida  3   Para  2   Term  2   Preterm      AB  1   Living  2     SAB  1   TAB      Ectopic      Multiple      Live Births  2            Home Medications    Prior to Admission medications   Medication Sig Start Date End Date Taking? Authorizing Provider  hydrochlorothiazide (HYDRODIURIL) 25 MG tablet TAKE 1 TABLET BY MOUTH ONCE DAILY 12/30/14  Yes [name], MD  valACYclovir (VALTREX) 500 MG tablet Take 500 mg by mouth. 10/03/13  Yes [provider]  gabapentin (NEURONTIN) 300 MG capsule Take 2 capsules (600 mg total) by mouth at bedtime. Patient not taking: Reported on 12/25/2017 11/19/14   Dennie Bible, NP  prochlorperazine (COMPAZINE) 10 MG tablet Take 1 tablet (10 mg total) by mouth every 6 (six) hours as needed (Nausea or vomiting). 11/06/12 12/13/12  Gardenia Phlegm, NP    Family History Family History  Problem Relation Age of Onset  . Heart disease Mother   . Aneurysm Mother   . Hypertension Father   . Prostate cancer Father 48  . Hypertension Sister   . Ovarian cancer Paternal Grandmother        died in her 61s  . Stomach cancer Maternal Grandmother 22  . Cancer Cousin        paternal cousin with an unknown form of cancer    Social History Social History   Tobacco Use  . Smoking status: Never Smoker  . Smokeless tobacco: Never Used  Substance Use Topics  . Alcohol use: No    Alcohol/week: 0.0  standard drinks  . Drug use: No     Allergies   Lyrica [pregabalin]   Review of Systems Review of Systems  Eyes: Negative for visual disturbance.  Neurological: Positive for headaches.  All other systems reviewed and are negative.    Physical Exam Updated Vital Signs BP (!) 164/94 (BP Location: Right Arm)   Pulse 71   Temp 97.6 F (36.4 C) (Oral)   Resp 17   LMP 12/25/2012   SpO2 100%   Physical Exam  Constitutional: She is oriented to person, place, and time.  She appears well-developed and well-nourished. No distress.  HENT:  Head: Normocephalic and atraumatic.  Mouth/Throat: Oropharynx is clear and moist.  No tenderness of the temporal artery   Eyes: Pupils are equal, round, and reactive to light. Conjunctivae and EOM are normal. No scleral icterus.  No nystagmus   Neck: Normal range of motion. Neck supple.  Full active and passive ROM without pain.  No midline or paraspinal tenderness. No nuchal rigidity or meningeal signs.  Cardiovascular: Normal rate, regular rhythm, normal heart sounds and intact distal pulses.  Pulmonary/Chest: Effort normal and breath sounds normal. No respiratory distress. She has no wheezes. She has no rales.  Abdominal: Soft. Bowel sounds are normal. She exhibits no distension. There is no tenderness. There is no rebound and no guarding.  Musculoskeletal: Normal range of motion.  Lymphadenopathy:    She has no cervical adenopathy.  Neurological: She is alert and oriented to person, place, and time. She has normal reflexes. No cranial nerve deficit. Coordination normal.  Alert, oriented, thought content appropriate, able to give a coherent history. Speech is clear and goal oriented, able to follow commands.  Cranial Nerves:  II:  Peripheral visual fields grossly normal, pupils equal, round, reactive to light III, IV, VI: EOM intact bilaterally, ptosis not present V,VII: smile symmetric, eyes kept closed tightly against resistance, facial  light touch sensation equal VIII: hearing grossly normal IX, X: symmetric soft palate movement, uvula elevates symmetrically  XI: bilateral shoulder shrug symmetric and strong XII: midline tongue extension 5/5 muscle strength in upper and lower extremities bilaterally including strong and equal grip strength and dorsiflexion/plantar flexion Sensory to light touch normal in all four extremities.  Normal finger-to-nose and rapid alternating movements. No drift.  Skin: Skin is warm and dry. No rash noted. She is not diaphoretic.  Nursing note and vitals reviewed.    ED Treatments / Results  Labs (all labs ordered are listed, but only abnormal results are displayed) Labs Reviewed - No data to display  EKG None  Radiology Ct Head Wo Contrast  Result Date: 12/25/2017 CLINICAL DATA:  Left frontal headache for 3 days. History of breast cancer. No reported injury. EXAM: CT HEAD WITHOUT CONTRAST TECHNIQUE: Contiguous axial images were obtained from the base of the skull through the vertex without intravenous contrast. COMPARISON:  03/23/2013 head CT. FINDINGS: Brain: No evidence of parenchymal hemorrhage or extra-axial fluid collection. No mass lesion, mass effect, or midline shift. No CT evidence of acute infarction. Cerebral volume is age appropriate. No ventriculomegaly. Vascular: No acute abnormality. Skull: No evidence of calvarial fracture. Sinuses/Orbits: The visualized paranasal sinuses are essentially clear. Other:  The mastoid air cells are unopacified. IMPRESSION: Negative head CT.  No evidence of acute intracranial abnormality. Electronically Signed   By: Ilona Sorrel M.D.   On: 12/25/2017 17:30    Procedures Procedures (including critical care time)  Medications Ordered in ED Medications  sodium chloride 0.9 % bolus 500 mL (500 mLs Intravenous New Bag/Given 12/25/17 1725)  prochlorperazine (COMPAZINE) injection 10 mg (10 mg Intravenous Given 12/25/17 1726)  diphenhydrAMINE  (BENADRYL) injection 12.5 mg (12.5 mg Intravenous Given 12/25/17 1725)     Initial Impression / Assessment and Plan / ED Course  I have reviewed the triage vital signs and the nursing notes.  Pertinent labs & imaging results that were available during my care of the patient were reviewed by me and considered in my medical decision making (see chart for details).    Ladesha R Hoelzel is a  57 y.o. female who presents to ED for persistent left-sided headache for the last 3 days with no associated symptoms.  She does have history of breast cancer, therefore CT of her head was obtained to rule out return of cancer/meds.  CT head was negative.  She has no focal deficits nor tenderness to the temporal artery.  She feels better after migraine cocktail. Evaluation does not show pathology that would require ongoing emergent intervention or inpatient treatment.  Reasons to return to the emergency department discussed and all questions answered.  Patient discussed with Dr. Maryan Rued who agrees with treatment plan.    Final Clinical Impressions(s) / ED Diagnoses   Final diagnoses:  Bad headache    ED Discharge Orders    None       Zaylen Susman, Ozella Almond, PA-C 12/25/17 1750    Blanchie Dessert, MD 12/25/17 2116

## 2017-12-25 NOTE — ED Triage Notes (Signed)
Pt arrives by pov with c/o of headache on left frontal side. Pt reports this headache started 3 days ago, worse with loud noises or moving around. Pt has taken motrin 600mg  and used cool rags with some relief and able to sleep but wakes up with headache. Pt is alert and ox4, no numbness, clear speech.

## 2017-12-25 NOTE — Discharge Instructions (Signed)
It was my pleasure taking care of you today!  Drink plenty of fluids at home. This will help with your headache.  Please follow up with your doctor if your symptoms are not improving.    Return to ER for new or worsening symptoms, any additional concerns.

## 2017-12-29 ENCOUNTER — Telehealth: Payer: Self-pay | Admitting: *Deleted

## 2017-12-29 NOTE — Telephone Encounter (Signed)
"  We need orders for this patient.  She's scheduled for December 12.  Could you ask Dr. March Rummage to put the orders in?"

## 2017-12-30 ENCOUNTER — Encounter (HOSPITAL_BASED_OUTPATIENT_CLINIC_OR_DEPARTMENT_OTHER): Payer: Self-pay | Admitting: *Deleted

## 2018-01-02 ENCOUNTER — Telehealth: Payer: Self-pay | Admitting: Podiatry

## 2018-01-02 NOTE — Telephone Encounter (Signed)
I called pt back to see if she could do her surgery next Friday, 20 December. While on the phone with pt she decided she wanted to wait and do her surgery the first week of January due to flying out of town for the Christmas Holiday. Pt's surgery has been rescheduled to Friday, 27 January 2018 at [facility] at 2 pm. Pt is calling her doctor to see if she needs to do another H&P.

## 2018-01-02 NOTE — Telephone Encounter (Signed)
I have too much going on with finals at school. I need to reschedule my surgery until after 12 December. Please call me back at [phone].

## 2018-01-02 NOTE — Telephone Encounter (Signed)
Jaime Murillo with [facility] called in regards to patients surgery scheduled for Friday,  27 January 2018. States Dr. March Rummage is requesting [employer] and she doesn't know who the rep is.

## 2018-01-12 ENCOUNTER — Telehealth: Payer: Self-pay | Admitting: *Deleted

## 2018-01-12 NOTE — Telephone Encounter (Signed)
I am returning your call.  You are scheduled for January 3.  The dates that you saw were probably your old post-op appointments.  "So I don't need to do anything else, just show up for my surgery?"  That's correct.  "I don't know where I'm supposed to go."  You are going to the [facility].  Their address is [address].  "Okay, thanks.  I go tomorrow for my appointment with my primary care doctor.  The said my other one I had done had expired."  That sounds good.  "Thanks for calling me back."

## 2018-01-12 NOTE — Telephone Encounter (Signed)
"  It's [facility] again.  I hate to bother you.  I am showing that I am scheduled for surgery on the third at 1 pm at [facility].  I'm going to primary care doctor tomorrow because 30 days have passed.  Today I got a call saying I have a scheduled appointment for the twentieth at 12:00.  I want to make sure everything is straight and correct because I should not be coming in at 12 because we canceled the surgery, that would have been a post visit.  Please give me a call."

## 2018-01-13 ENCOUNTER — Other Ambulatory Visit: Payer: Self-pay

## 2018-01-13 ENCOUNTER — Encounter (HOSPITAL_BASED_OUTPATIENT_CLINIC_OR_DEPARTMENT_OTHER): Payer: Self-pay | Admitting: *Deleted

## 2018-01-13 ENCOUNTER — Other Ambulatory Visit: Payer: Medicare Other

## 2018-01-13 DIAGNOSIS — E785 Hyperlipidemia, unspecified: Secondary | ICD-10-CM | POA: Diagnosis not present

## 2018-01-13 DIAGNOSIS — G620 Drug-induced polyneuropathy: Secondary | ICD-10-CM | POA: Diagnosis not present

## 2018-01-13 DIAGNOSIS — R7303 Prediabetes: Secondary | ICD-10-CM | POA: Diagnosis not present

## 2018-01-13 DIAGNOSIS — I10 Essential (primary) hypertension: Secondary | ICD-10-CM | POA: Diagnosis not present

## 2018-01-13 DIAGNOSIS — B009 Herpesviral infection, unspecified: Secondary | ICD-10-CM | POA: Diagnosis not present

## 2018-01-13 DIAGNOSIS — E559 Vitamin D deficiency, unspecified: Secondary | ICD-10-CM | POA: Diagnosis not present

## 2018-01-13 NOTE — Telephone Encounter (Signed)
I called to inform Amy about the Representative for [employer] for Dr. March Rummage case that is scheduled for January 27, 2018.  "She's scrubbed in at this time."  Can I leave her a message?  "Yes, that's fine."  Tell her that Lars Pinks is the rep for [employer] and his phone number is [phone].  I'll give her the message.

## 2018-01-23 ENCOUNTER — Telehealth: Payer: Self-pay | Admitting: *Deleted

## 2018-01-23 NOTE — Telephone Encounter (Signed)
"  I'm calling to remind Dr. March Rummage to put in his orders for this patient.  She has surgery coming up."  I will let him know.

## 2018-01-26 ENCOUNTER — Telehealth: Payer: Self-pay | Admitting: *Deleted

## 2018-01-26 NOTE — Telephone Encounter (Signed)
"  We have not received the patient's history and physical form."  She had it done.  I will call and let her know.  I am calling you in regards to your history and physical form.  I received a call from the surgical center and they are saying they have not received the H&P form.  "I have a copy of it that I was going to take with me the morning of surgery.  I had spoken to the pre-op nurse and she told me to make sure I got a copy because sometime they get lost in the mix.  To be cautious, I'm going to call the doctor's office and have them to fax it again.  Who do I have them fax it to?"  Send it to me, my fax number is [phone].  "Okay, I'm going to call them now."

## 2018-01-26 NOTE — Progress Notes (Signed)
Spoke with Delydia at Dr. Karolee Ohs' office to remind them that we need an H&P for this pt to continue with surgery in the AM. 01/27/18.

## 2018-01-27 ENCOUNTER — Encounter (HOSPITAL_BASED_OUTPATIENT_CLINIC_OR_DEPARTMENT_OTHER)
Admission: RE | Disposition: A | Discharge status: Home / Self Care | Payer: Self-pay | Source: Ambulatory Visit | Attending: Podiatry

## 2018-01-27 ENCOUNTER — Ambulatory Visit (HOSPITAL_BASED_OUTPATIENT_CLINIC_OR_DEPARTMENT_OTHER)
Admission: RE | Admit: 2018-01-27 | Discharge: 2018-01-27 | Disposition: A | Discharge status: Home / Self Care | Payer: Medicare Other | Source: Ambulatory Visit | Attending: Podiatry | Admitting: Podiatry

## 2018-01-27 ENCOUNTER — Ambulatory Visit (HOSPITAL_BASED_OUTPATIENT_CLINIC_OR_DEPARTMENT_OTHER): Payer: Medicare Other | Admitting: Anesthesiology

## 2018-01-27 ENCOUNTER — Encounter (HOSPITAL_BASED_OUTPATIENT_CLINIC_OR_DEPARTMENT_OTHER): Payer: Self-pay | Admitting: Anesthesiology

## 2018-01-27 ENCOUNTER — Other Ambulatory Visit: Payer: Self-pay

## 2018-01-27 ENCOUNTER — Ambulatory Visit (HOSPITAL_COMMUNITY): Payer: Medicare Other

## 2018-01-27 DIAGNOSIS — Z9013 Acquired absence of bilateral breasts and nipples: Secondary | ICD-10-CM | POA: Diagnosis not present

## 2018-01-27 DIAGNOSIS — M2011 Hallux valgus (acquired), right foot: Secondary | ICD-10-CM | POA: Diagnosis not present

## 2018-01-27 DIAGNOSIS — Z853 Personal history of malignant neoplasm of breast: Secondary | ICD-10-CM | POA: Diagnosis not present

## 2018-01-27 DIAGNOSIS — Z9221 Personal history of antineoplastic chemotherapy: Secondary | ICD-10-CM | POA: Insufficient documentation

## 2018-01-27 DIAGNOSIS — M205X1 Other deformities of toe(s) (acquired), right foot: Secondary | ICD-10-CM | POA: Diagnosis not present

## 2018-01-27 DIAGNOSIS — I10 Essential (primary) hypertension: Secondary | ICD-10-CM | POA: Diagnosis not present

## 2018-01-27 DIAGNOSIS — M79671 Pain in right foot: Secondary | ICD-10-CM | POA: Diagnosis not present

## 2018-01-27 DIAGNOSIS — Z9889 Other specified postprocedural states: Secondary | ICD-10-CM

## 2018-01-27 DIAGNOSIS — M2012 Hallux valgus (acquired), left foot: Secondary | ICD-10-CM | POA: Diagnosis not present

## 2018-01-27 HISTORY — DX: Malignant neoplasm of lower-inner quadrant of right female breast: Z17.1

## 2018-01-27 HISTORY — PX: AIKEN OSTEOTOMY: SHX6331

## 2018-01-27 HISTORY — DX: Malignant neoplasm of lower-inner quadrant of right female breast: C50.311

## 2018-01-27 HISTORY — DX: Polyneuropathy in diseases classified elsewhere: G63

## 2018-01-27 HISTORY — DX: Personal history of antineoplastic chemotherapy: Z92.21

## 2018-01-27 HISTORY — DX: Malignant (primary) neoplasm, unspecified: C80.1

## 2018-01-27 LAB — POCT I-STAT, CHEM 8
BUN: 22 mg/dL — ABNORMAL HIGH (ref 6–20)
Calcium, Ion: 1.09 mmol/L — ABNORMAL LOW (ref 1.15–1.40)
Chloride: 103 mmol/L (ref 98–111)
Creatinine, Ser: 0.7 mg/dL (ref 0.44–1.00)
Glucose, Bld: 76 mg/dL (ref 70–99)
HCT: 38 % (ref 36.0–46.0)
Hemoglobin: 12.9 g/dL (ref 12.0–15.0)
POTASSIUM: 4.6 mmol/L (ref 3.5–5.1)
Sodium: 139 mmol/L (ref 135–145)
TCO2: 32 mmol/L (ref 22–32)

## 2018-01-27 SURGERY — BUNIONECTOMY, AKIN
Anesthesia: Monitor Anesthesia Care | Site: Toe | Laterality: Right

## 2018-01-27 MED ORDER — FENTANYL CITRATE (PF) 100 MCG/2ML IJ SOLN
INTRAMUSCULAR | Status: AC
  Filled 2018-01-27: qty 2

## 2018-01-27 MED ORDER — MIDAZOLAM HCL 2 MG/2ML IJ SOLN: 0.5000 mg | Freq: Once | INTRAMUSCULAR | Status: DC | PRN

## 2018-01-27 MED ORDER — FENTANYL CITRATE (PF) 100 MCG/2ML IJ SOLN
50.0000 ug | INTRAMUSCULAR | Status: DC | PRN
  Administered 2018-01-27: 25 ug via INTRAVENOUS
  Administered 2018-01-27: 100 ug via INTRAVENOUS

## 2018-01-27 MED ORDER — BUPIVACAINE HCL (PF) 0.5 % IJ SOLN
INTRAMUSCULAR | Status: AC
  Filled 2018-01-27: qty 30

## 2018-01-27 MED ORDER — MEPERIDINE HCL 25 MG/ML IJ SOLN: 6.2500 mg | INTRAMUSCULAR | Status: DC | PRN

## 2018-01-27 MED ORDER — CEPHALEXIN 500 MG PO CAPS: 500.0000 mg | ORAL_CAPSULE | Freq: Two times a day (BID) | ORAL | 0 refills | Status: DC

## 2018-01-27 MED ORDER — MIDAZOLAM HCL 2 MG/2ML IJ SOLN
1.0000 mg | INTRAMUSCULAR | Status: DC | PRN
  Administered 2018-01-27: 2 mg via INTRAVENOUS

## 2018-01-27 MED ORDER — OXYCODONE-ACETAMINOPHEN 10-325 MG PO TABS: 1.0000 | ORAL_TABLET | ORAL | 0 refills | Status: DC | PRN

## 2018-01-27 MED ORDER — OXYCODONE HCL 5 MG/5ML PO SOLN: 5.0000 mg | Freq: Once | ORAL | Status: DC | PRN

## 2018-01-27 MED ORDER — SCOPOLAMINE 1 MG/3DAYS TD PT72
1.0000 | MEDICATED_PATCH | Freq: Once | TRANSDERMAL | Status: DC | PRN
  Administered 2018-01-27: 1.5 mg via TRANSDERMAL

## 2018-01-27 MED ORDER — BUPIVACAINE HCL (PF) 0.5 % IJ SOLN
INTRAMUSCULAR | Status: DC | PRN
  Administered 2018-01-27: 10 mL

## 2018-01-27 MED ORDER — CEFAZOLIN SODIUM-DEXTROSE 2-4 GM/100ML-% IV SOLN
2.0000 g | INTRAVENOUS | Status: AC
  Administered 2018-01-27: 2 g via INTRAVENOUS

## 2018-01-27 MED ORDER — SCOPOLAMINE 1 MG/3DAYS TD PT72: 1.0000 | MEDICATED_PATCH | TRANSDERMAL | Status: DC

## 2018-01-27 MED ORDER — PROPOFOL 500 MG/50ML IV EMUL
INTRAVENOUS | Status: DC | PRN
  Administered 2018-01-27: 200 ug/kg/min via INTRAVENOUS

## 2018-01-27 MED ORDER — LIDOCAINE HCL (CARDIAC) PF 100 MG/5ML IV SOSY
PREFILLED_SYRINGE | INTRAVENOUS | Status: DC | PRN
  Administered 2018-01-27: 50 mg via INTRAVENOUS

## 2018-01-27 MED ORDER — SCOPOLAMINE 1 MG/3DAYS TD PT72
MEDICATED_PATCH | TRANSDERMAL | Status: AC
  Filled 2018-01-27: qty 1

## 2018-01-27 MED ORDER — OXYCODONE HCL 5 MG PO TABS: 5.0000 mg | ORAL_TABLET | Freq: Once | ORAL | Status: DC | PRN

## 2018-01-27 MED ORDER — PROMETHAZINE HCL 25 MG/ML IJ SOLN: 6.2500 mg | INTRAMUSCULAR | Status: DC | PRN

## 2018-01-27 MED ORDER — LACTATED RINGERS IV SOLN
INTRAVENOUS | Status: DC
  Administered 2018-01-27: 10 mL/h via INTRAVENOUS

## 2018-01-27 MED ORDER — DEXAMETHASONE SODIUM PHOSPHATE 10 MG/ML IJ SOLN
INTRAMUSCULAR | Status: DC | PRN
  Administered 2018-01-27: 10 mg via INTRAVENOUS

## 2018-01-27 MED ORDER — PROMETHAZINE HCL 25 MG PO TABS: 25.0000 mg | ORAL_TABLET | Freq: Three times a day (TID) | ORAL | 0 refills | Status: DC | PRN

## 2018-01-27 MED ORDER — BUPIVACAINE-EPINEPHRINE (PF) 0.5% -1:200000 IJ SOLN
INTRAMUSCULAR | Status: DC | PRN
  Administered 2018-01-27: 10 mL via PERINEURAL
  Administered 2018-01-27: 20 mL via PERINEURAL

## 2018-01-27 MED ORDER — MIDAZOLAM HCL 2 MG/2ML IJ SOLN
INTRAMUSCULAR | Status: AC
  Filled 2018-01-27: qty 2

## 2018-01-27 MED ORDER — CEFAZOLIN SODIUM-DEXTROSE 2-4 GM/100ML-% IV SOLN
INTRAVENOUS | Status: AC
  Filled 2018-01-27: qty 100

## 2018-01-27 MED ORDER — ONDANSETRON HCL 4 MG/2ML IJ SOLN
INTRAMUSCULAR | Status: DC | PRN
  Administered 2018-01-27: 4 mg via INTRAVENOUS

## 2018-01-27 MED ORDER — HYDROMORPHONE HCL 1 MG/ML IJ SOLN: 0.2500 mg | INTRAMUSCULAR | Status: DC | PRN

## 2018-01-27 SURGICAL SUPPLY — 61 items
BANDAGE ACE 3X5.8 VEL STRL LF (GAUZE/BANDAGES/DRESSINGS) ×3 IMPLANT
BANDAGE ACE 4X5 VEL STRL LF (GAUZE/BANDAGES/DRESSINGS) IMPLANT
BIT DRILL 3.0-3.5 CANNULATED (BIT) ×3 IMPLANT
BLADE AVERAGE 25MMX9MM (BLADE) ×1
BLADE AVERAGE 25X9 (BLADE) ×2 IMPLANT
BLADE HEX COATED 2.75 (ELECTRODE) ×3 IMPLANT
BLADE MINI RND TIP GREEN BEAV (BLADE) IMPLANT
BLADE SURG 15 STRL LF DISP TIS (BLADE) ×2 IMPLANT
BLADE SURG 15 STRL SS (BLADE) ×4
BNDG ESMARK 4X9 LF (GAUZE/BANDAGES/DRESSINGS) ×3 IMPLANT
BNDG GAUZE ELAST 4 BULKY (GAUZE/BANDAGES/DRESSINGS) ×3 IMPLANT
BOOT STEPPER DURA MED (SOFTGOODS) ×3 IMPLANT
CHLORAPREP W/TINT 26ML (MISCELLANEOUS) ×3 IMPLANT
CLOSURE WOUND 1/2 X4 (GAUZE/BANDAGES/DRESSINGS) ×1
COVER BACK TABLE 60X90IN (DRAPES) ×3 IMPLANT
COVER WAND RF STERILE (DRAPES) IMPLANT
CUFF TOURNIQUET SINGLE 18IN (TOURNIQUET CUFF) ×3 IMPLANT
DERMABOND ADVANCED (GAUZE/BANDAGES/DRESSINGS) ×2
DERMABOND ADVANCED .7 DNX12 (GAUZE/BANDAGES/DRESSINGS) ×1 IMPLANT
DRAPE EXTREMITY T 121X128X90 (DISPOSABLE) ×3 IMPLANT
DRAPE OEC MINIVIEW 54X84 (DRAPES) ×3 IMPLANT
DRSG PAD ABDOMINAL 8X10 ST (GAUZE/BANDAGES/DRESSINGS) IMPLANT
ELECT REM PT RETURN 9FT ADLT (ELECTROSURGICAL) ×3
ELECTRODE REM PT RTRN 9FT ADLT (ELECTROSURGICAL) ×1 IMPLANT
GAUZE 4X4 16PLY RFD (DISPOSABLE) IMPLANT
GAUZE SPONGE 4X4 12PLY STRL (GAUZE/BANDAGES/DRESSINGS) ×3 IMPLANT
GAUZE XEROFORM 1X8 LF (GAUZE/BANDAGES/DRESSINGS) ×3 IMPLANT
GLOVE BIO SURGEON STRL SZ7.5 (GLOVE) ×6 IMPLANT
GLOVE BIOGEL PI IND STRL 8 (GLOVE) ×3 IMPLANT
GLOVE BIOGEL PI INDICATOR 8 (GLOVE) ×6
GOWN STRL REUS W/ TWL LRG LVL3 (GOWN DISPOSABLE) ×1 IMPLANT
GOWN STRL REUS W/TWL LRG LVL3 (GOWN DISPOSABLE) ×2
GOWN STRL REUS W/TWL XL LVL3 (GOWN DISPOSABLE) ×6 IMPLANT
GUIDE YOUNGSWICK 2 (MISCELLANEOUS) ×3 IMPLANT
GUIDEWIRE THREADED 4X 045 (WIRE) ×2 IMPLANT
GUIEWIRE THREADED 4X 045 (WIRE) ×6
NEEDLE HYPO 25X1 1.5 SAFETY (NEEDLE) ×3 IMPLANT
NS IRRIG 1000ML POUR BTL (IV SOLUTION) ×3 IMPLANT
PACK BASIN DAY SURGERY FS (CUSTOM PROCEDURE TRAY) ×3 IMPLANT
PADDING CAST ABS 4INX4YD NS (CAST SUPPLIES) ×2
PADDING CAST ABS COTTON 4X4 ST (CAST SUPPLIES) ×1 IMPLANT
PENCIL BUTTON HOLSTER BLD 10FT (ELECTRODE) ×3 IMPLANT
SCREW GO EZ 3.0X14 (Screw) ×3 IMPLANT
SCREW GO EZ 3.0X16 (Screw) ×3 IMPLANT
SHEET MEDIUM DRAPE 40X70 STRL (DRAPES) ×3 IMPLANT
SLEEVE SCD COMPRESS KNEE MED (MISCELLANEOUS) ×3 IMPLANT
STOCKINETTE 6  STRL (DRAPES) ×2
STOCKINETTE 6 STRL (DRAPES) ×1 IMPLANT
STOCKINETTE SYNTHETIC 4 NONSTR (MISCELLANEOUS) ×3 IMPLANT
STRIP CLOSURE SKIN 1/2X4 (GAUZE/BANDAGES/DRESSINGS) ×2 IMPLANT
SUT ETHILON 4 0 PS 2 18 (SUTURE) IMPLANT
SUT MON AB 5-0 PS2 18 (SUTURE) ×3 IMPLANT
SUT VIC AB 2-0 SH 27 (SUTURE) ×2
SUT VIC AB 2-0 SH 27XBRD (SUTURE) ×1 IMPLANT
SUT VIC AB 3-0 FS2 27 (SUTURE) ×3 IMPLANT
SUT VICRYL 4-0 PS2 18IN ABS (SUTURE) ×3 IMPLANT
SYR BULB 3OZ (MISCELLANEOUS) ×3 IMPLANT
SYR CONTROL 10ML LL (SYRINGE) ×3 IMPLANT
TOWEL GREEN STERILE FF (TOWEL DISPOSABLE) ×3 IMPLANT
UNDERPAD 30X30 (UNDERPADS AND DIAPERS) ×3 IMPLANT
YANKAUER SUCT BULB TIP NO VENT (SUCTIONS) IMPLANT

## 2018-01-27 NOTE — Anesthesia Procedure Notes (Signed)
   Anesthesia Regional Block: Adductor canal block   Pre-Anesthetic Checklist: ,, timeout performed, Correct Patient, Correct Site, Correct Laterality, Correct Procedure, Correct Position, site marked, Risks and benefits discussed,  Surgical consent,  Pre-op evaluation,  At surgeon's request and post-op pain management  Laterality: Right  Prep: chloraprep       Needles:  Injection technique: Single-shot  Needle Type: Echogenic Needle     Needle Length: 9cm  Needle Gauge: 21     Additional Needles:   Procedures:,,,, ultrasound used (permanent image in chart),,,,  Narrative:  Start time: 01/27/2018 1:19 PM End time: 01/27/2018 1:25 PM Injection made incrementally with aspirations every 5 mL.  Performed by: Personally  Anesthesiologist: Annye Asa, MD  Additional Notes: Pt identified in Holding room.  Monitors applied. Working IV access confirmed. Sterile prep, drape R thigh.  #21ga ECHOgenic needle into adductor canal with US guidance.  10cc 0.5% Bupivacaine with 1:200k epi injected incrementally after negative test dose.  Patient asymptomatic, VSS, no heme aspirated, tolerated well.  Jenita Seashore, MD

## 2018-01-27 NOTE — Anesthesia Procedure Notes (Signed)
Anesthesia Regional Block: Popliteal block   Pre-Anesthetic Checklist: ,, timeout performed, Correct Patient, Correct Site, Correct Laterality, Correct Procedure, Correct Position, site marked, Risks and benefits discussed,  Surgical consent,  Pre-op evaluation,  At surgeon's request and post-op pain management  Laterality: Right and Lower  Prep: chloraprep       Needles:  Injection technique: Single-shot  Needle Type: Echogenic Stimulator Needle     Needle Length: 9cm  Needle Gauge: 21     Additional Needles:   Procedures:, nerve stimulator,,, ultrasound used (permanent image in chart),,,,   Nerve Stimulator or Paresthesia:  Response: toe dorsiflexion, 0.44 mA, 0.1 ms,   Additional Responses:   Narrative:  Start time: 01/27/2018 1:27 PM End time: 01/27/2018 1:41 PM Injection made incrementally with aspirations every 5 mL.  Performed by: Personally  Anesthesiologist: Annye Asa, MD  Additional Notes: Pt identified in Holding room.  Monitors applied. Working IV access confirmed. Sterile prep R lateral knee.  #21ga ECHOgenic PNS to toe twitch at 0.69mA threshold, with US guidance.  20cc 0.5% Bupivacaine with 1:200k epi injected incrementally after negative test dose.  Patient asymptomatic, VSS, no heme aspirated, tolerated well.  Jenita Seashore, MD

## 2018-01-27 NOTE — Anesthesia Preprocedure Evaluation (Addendum)
Anesthesia Evaluation  Patient identified by MRN, date of birth, ID band Patient awake    Reviewed: Allergy & Precautions, NPO status , Patient's Chart, lab work & pertinent test results  History of Anesthesia Complications (+) PONV  Airway Mallampati: I  TM Distance: >3 FB Neck ROM: Full    Dental  (+) Missing, Poor Dentition, Chipped, Dental Advisory Given   Pulmonary neg pulmonary ROS,    breath sounds clear to auscultation       Cardiovascular hypertension, Pt. on medications (-) angina Rhythm:Regular Rate:Normal  '14 ECHO: EF 55-60%, mild MR   Neuro/Psych Anxiety Depression  Neuromuscular disease (neuropathy in feet (s/p chemo))    GI/Hepatic negative GI ROS, Neg liver ROS,   Endo/Other  negative endocrine ROS  Renal/GU negative Renal ROS     Musculoskeletal   Abdominal   Peds  Hematology negative hematology ROS (+)   Anesthesia Other Findings H/o breast cancer: chemo, mastectomy  Reproductive/Obstetrics                            Anesthesia Physical Anesthesia Plan  ASA: II  Anesthesia Plan: Regional and MAC   Post-op Pain Management:    Induction:   PONV Risk Score and Plan: 3 and Ondansetron, Dexamethasone and Scopolamine patch - Pre-op  Airway Management Planned: Natural Airway and Simple Face Mask  Additional Equipment:   Intra-op Plan:   Post-operative Plan:   Informed Consent: I have reviewed the patients History and Physical, chart, labs and discussed the procedure including the risks, benefits and alternatives for the proposed anesthesia with the patient or authorized representative who has indicated his/her understanding and acceptance.   Dental advisory given  Plan Discussed with: CRNA and Surgeon  Anesthesia Plan Comments: (Plan routine monitors, MAC with popliteal and saph blocks)        Anesthesia Quick Evaluation

## 2018-01-27 NOTE — Progress Notes (Signed)
Assisted Dr. Annye Asa with right, ultrasound guided, popliteal/saphenous block. Side rails up, monitors on throughout procedure. See vital signs in flow sheet. Tolerated Procedure well.

## 2018-01-27 NOTE — Discharge Instructions (Signed)

## 2018-01-27 NOTE — Progress Notes (Signed)
Husband has wedding band other personal belongings other than clothes she wore into center which are in locker 7.

## 2018-01-27 NOTE — Transfer of Care (Signed)
Immediate Anesthesia Transfer of Care Note  Patient: Jaime Murillo  Procedure(s) Performed: DOUBLE   [name] (Right Toe)  Patient Location: PACU  Anesthesia Type:MAC and Regional  Level of Consciousness: awake and sedated  Airway & Oxygen Therapy: Patient Spontanous Breathing and Patient connected to face mask oxygen  Post-op Assessment: Report given to RN and Post -op Vital signs reviewed and stable  Post vital signs: Reviewed and stable  Last Vitals:  Vitals Value Taken Time  BP    Temp    Pulse 82 01/27/2018  3:36 PM  Resp 12 01/27/2018  3:36 PM  SpO2 100 % 01/27/2018  3:36 PM  Vitals shown include unvalidated device data.  Last Pain:  Vitals:   01/27/18 1250  PainSc: 1       Patients Stated Pain Goal: 1 (78/67/67 2094)  Complications: No apparent anesthesia complications

## 2018-01-27 NOTE — H&P (Signed)
  Subjective:  Patient ID: Jaime Murillo, female    DOB: 03-Mar-1960,  MRN: [id number]  No chief complaint on file.   58 y.o. female here for outpatient surgery. Denies updates to history since last visit.  Review of Systems: Negative except as noted in the HPI. Denies N/V/F/Ch.  Past Medical History:  Diagnosis Date  . Anemia   . Anxiety   . Depression   . History of cancer chemotherapy 07-17-2012  to  12-04-2012   right breast cancer  . HSV infection   . Hypertension   . Insomnia   . Malignant neoplasm of lower-inner quadrant of right breast of female, estrogen receptor negative [facility]) oncologist-  dr Lindi Adie--  per lov note in epic ,  no recurrence   dx 05/ 2014---  right breast IDC & DCIS, Grade 3, triple negative----  completed neo-adjuvant chemo 12-04-2012,  03-20-2013  right total mastectomy with sln bx (07-24-2013  left simple mastectomy w/ sln bx)  . 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 Nocturia   . PONV (postoperative nausea and vomiting)    shaking after anesthesia; had n/v after mastectomy  . Urinary frequency   . Wears glasses     Current Facility-Administered Medications:  .  [START ON 01/28/2018] ceFAZolin (ANCEF) IVPB 2g/100 mL premix, 2 g, Intravenous, On Call to OR, Swan Zayed, Christian Mate, DPM .  fentaNYL (SUBLIMAZE) injection 50-100 mcg, 50-100 mcg, Intravenous, PRN, Josephine Igo, MD .  lactated ringers infusion, , Intravenous, Continuous, Josephine Igo, MD, Last Rate: 10 mL/hr at 01/27/18 1306, 10 mL/hr at 01/27/18 1306 .  midazolam (VERSED) injection 1-2 mg, 1-2 mg, Intravenous, PRN, Josephine Igo, MD .  scopolamine (TRANSDERM-SCOP) 1 MG/3DAYS 1.5 mg, 1 patch, Transdermal, Once PRN, Josephine Igo, MD, 1.5 mg at 01/27/18 1309 .  scopolamine (TRANSDERM-SCOP) 1 MG/3DAYS 1.5 mg, 1 patch, Transdermal, Q72H, Annye Asa, MD  Social History   Tobacco Use  Smoking  Status Never Smoker  Smokeless Tobacco Never Used    Allergies  Allergen Reactions  . Lyrica [Pregabalin] Other (See Comments)    Hallucinations and bad dreams   Objective:   Vitals:   01/27/18 1302  BP: 139/88  Pulse: 63  Resp: 10  SpO2: 99%   Body mass index is 21.72 kg/m. Constitutional Well developed. Well nourished.  Vascular Dorsalis pedis pulses palpable bilaterally. Posterior tibial pulses palpable bilaterally. Capillary refill normal to all digits.  No cyanosis or clubbing noted. Pedal hair growth normal.  Neurologic Normal speech. Oriented to person, place, and time. Epicritic sensation to light touch grossly present bilaterally.  Dermatologic Nails well groomed and normal in appearance. No open wounds. No skin lesions.  Orthopedic: Normal joint ROM without pain or crepitus bilaterally. No visible deformities. Pain on ROM of 1st MPJ. Dorsal osteophyte   Radiographs: None today Assessment/Plan   Hallux Valgus R -To OR today. Consent reviewed and signed. -Planned procedures: Right [place] bunionectomy   No follow-ups on file.

## 2018-01-27 NOTE — Brief Op Note (Signed)
01/27/2018  3:30 PM  PATIENT:  Jaime Murillo  58 y.o. female  PRE-OPERATIVE DIAGNOSIS:  HALLUX ABDUCTO VALGUS OF RIGHT FOOT  POST-OPERATIVE DIAGNOSIS:  HALLUX ABDUCTO VALGUS OF RIGHT FOOT  PROCEDURE:  Procedure(s) with comments: DOUBLE   OSTEOTOMY, [name] (Right) - POPLITEAL/SAPH BLOCK  SURGEON:  Surgeon(s) and Role:    * Evelina Bucy, DPM - Primary  PHYSICIAN ASSISTANT:   ASSISTANTS: none   ANESTHESIA:   local, regional and MAC  EBL:  5 ml   BLOOD ADMINISTERED:none  DRAINS: none   LOCAL MEDICATIONS USED:  MARCAINE    and Amount: 10 ml  SPECIMEN:  No Specimen  DISPOSITION OF SPECIMEN:  N/A  COUNTS:  YES  TOURNIQUET:   Total Tourniquet Time Documented: Calf (Right) - 74 minutes Total: Calf (Right) - 74 minutes   DICTATION: .Jaime Murillo Dictation  PLAN OF CARE: Discharge to home after PACU  PATIENT DISPOSITION:  PACU - hemodynamically stable.   Delay start of Pharmacological VTE agent (>24hrs) due to surgical blood loss or risk of bleeding: not applicable

## 2018-01-27 NOTE — Anesthesia Procedure Notes (Signed)
Procedure Name: MAC Performed by: Laith Antonelli W, CRNA Pre-anesthesia Checklist: Patient identified, Emergency Drugs available, Suction available, Patient being monitored and Timeout performed Oxygen Delivery Method: Simple face mask       

## 2018-01-27 NOTE — H&P (Signed)
Anesthesia H&P Update: History and Physical Exam reviewed; patient is OK for planned anesthetic and procedure. ? ?

## 2018-01-29 NOTE — Anesthesia Postprocedure Evaluation (Signed)
Anesthesia Post Note  Patient: Jaime Murillo  Procedure(s) Performed: DOUBLE   [name] (Right Toe)     Patient location during evaluation: PACU Anesthesia Type: Regional Level of consciousness: awake and alert Pain management: pain level controlled Vital Signs Assessment: post-procedure vital signs reviewed and stable Respiratory status: spontaneous breathing, nonlabored ventilation and respiratory function stable Cardiovascular status: blood pressure returned to baseline and stable Postop Assessment: no apparent nausea or vomiting Anesthetic complications: no    Last Vitals:  Vitals:   01/27/18 1630 01/27/18 1645  BP: 123/84 136/84  Pulse: 63 67  Resp: (!) 22 16  Temp:  (!) 36.3 C  SpO2: 100% 100%    Last Pain:  Vitals:   01/27/18 1645  PainSc: 0-No pain                 Brennan Bailey

## 2018-02-02 ENCOUNTER — Ambulatory Visit (INDEPENDENT_AMBULATORY_CARE_PROVIDER_SITE_OTHER): Payer: Medicare Other

## 2018-02-02 ENCOUNTER — Ambulatory Visit (INDEPENDENT_AMBULATORY_CARE_PROVIDER_SITE_OTHER): Payer: Self-pay | Admitting: Podiatry

## 2018-02-02 VITALS — Temp 98.6°F

## 2018-02-02 DIAGNOSIS — M7751 Other enthesopathy of right foot: Secondary | ICD-10-CM

## 2018-02-02 DIAGNOSIS — M205X1 Other deformities of toe(s) (acquired), right foot: Secondary | ICD-10-CM

## 2018-02-05 NOTE — Progress Notes (Signed)
Subjective:  Patient ID: Jaime Murillo, female    DOB: 1960/02/28,  MRN: [id number]  Chief Complaint  Patient presents with  . Routine Post [facility] 01.03.2020 Double Osteotomy Rt " my foot feels ok, the first few days were rough but it is better now"     DOS: 01/27/17 Procedure: [place] Bunionectomy R  58 y.o. female returns for post-op check. History as above.  Review of Systems: Negative except as noted in the HPI. Denies N/V/F/Ch.  Past Medical History:  Diagnosis Date  . Anemia   . Anxiety   . Depression   . History of cancer chemotherapy 07-17-2012  to  12-04-2012   right breast cancer  . HSV infection   . Hypertension   . Insomnia   . Malignant neoplasm of lower-inner quadrant of right breast of female, estrogen receptor negative [facility]) oncologist-  dr Jaime Murillo--  per lov note in epic ,  no recurrence   dx 05/ 2014---  right breast IDC & DCIS, Grade 3, triple negative----  completed neo-adjuvant chemo 12-04-2012,  03-20-2013  right total mastectomy with sln bx (07-24-2013  left simple mastectomy w/ sln bx)  . Neuropathy associated with cancer ([place])    related to chemotherapy  . 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 Nocturia   . PONV (postoperative nausea and vomiting)    shaking after anesthesia; had n/v after mastectomy  . Urinary frequency   . Wears glasses     Current Outpatient Medications:  .  cephALEXin (KEFLEX) 500 MG capsule, Take 1 capsule (500 mg total) by mouth 2 (two) times daily., Disp: 14 capsule, Rfl: 0 .  gabapentin (NEURONTIN) 300 MG capsule, Take 2 capsules (600 mg total) by mouth at bedtime., Disp: 180 capsule, Rfl: 2 .  hydrochlorothiazide (HYDRODIURIL) 25 MG tablet, TAKE 1 TABLET BY MOUTH ONCE DAILY, Disp: 30 tablet, Rfl: 5 .  oxyCODONE-acetaminophen (PERCOCET) 10-325 MG tablet, Take 1 tablet by mouth every 4 (four) hours as needed for pain., Disp: 20 tablet, Rfl: 0 .  promethazine (PHENERGAN) 25 MG tablet, Take 1 tablet (25  mg total) by mouth every 8 (eight) hours as needed for nausea or vomiting., Disp: 20 tablet, Rfl: 0 .  valACYclovir (VALTREX) 500 MG tablet, Take 500 mg by mouth., Disp: , Rfl:   Social History   Tobacco Use  Smoking Status Never Smoker  Smokeless Tobacco Never Used    Allergies  Allergen Reactions  . Lyrica [Pregabalin] Other (See Comments)    Hallucinations and bad dreams   Objective:   Vitals:   02/02/18 1425  Temp: 98.6 F (37 C)   There is no height or weight on file to calculate BMI. Constitutional Well developed. Well nourished.  Vascular Foot warm and well perfused. Capillary refill normal to all digits.   Neurologic Normal speech. Oriented to person, place, and time. Epicritic sensation to light touch grossly present bilaterally.  Dermatologic Skin healing well without signs of infection. Skin edges well coapted without signs of infection.  Orthopedic: Tenderness to palpation noted about the surgical site.   Radiographs: Taken and reviewed c/w post-op state. Assessment:   1. Hallux limitus, right   2. Capsulitis of metatarsophalangeal (MTP) joint of right foot    Plan:  Patient was evaluated and treated and all questions answered.  S/p foot surgery right -Progressing as expected post-operatively. -XR: As above -WB Status: WBAT in boot -Sutures: intact. -Medications: none refilled today. -Foot  redressed.  Return for Jaime Murillo patient, keep current post op appt.

## 2018-02-07 ENCOUNTER — Encounter (HOSPITAL_BASED_OUTPATIENT_CLINIC_OR_DEPARTMENT_OTHER): Payer: Self-pay | Admitting: Podiatry

## 2018-02-10 ENCOUNTER — Ambulatory Visit (INDEPENDENT_AMBULATORY_CARE_PROVIDER_SITE_OTHER): Payer: Medicare Other | Admitting: Podiatry

## 2018-02-10 DIAGNOSIS — M7751 Other enthesopathy of right foot: Secondary | ICD-10-CM

## 2018-02-10 DIAGNOSIS — M21612 Bunion of left foot: Secondary | ICD-10-CM

## 2018-02-10 DIAGNOSIS — M205X1 Other deformities of toe(s) (acquired), right foot: Secondary | ICD-10-CM

## 2018-02-10 DIAGNOSIS — M2012 Hallux valgus (acquired), left foot: Secondary | ICD-10-CM

## 2018-02-10 MED ORDER — OXYCODONE-ACETAMINOPHEN 10-325 MG PO TABS: 1.0000 | ORAL_TABLET | ORAL | 0 refills | Status: DC | PRN

## 2018-02-10 NOTE — Progress Notes (Signed)
Subjective:  Patient ID: Jaime Murillo, female    DOB: 01/20/1961,  MRN: [id number]  Chief Complaint  Patient presents with  . Routine Post [facility] 01.03.2020 Double Osteotomy Rt    DOS: 01/27/17 Procedure: [place] Bunionectomy R  58 y.o. female returns for post-op check. History as above.  Review of Systems: Negative except as noted in the HPI. Denies N/V/F/Ch.  Past Medical History:  Diagnosis Date  . Anemia   . Anxiety   . Depression   . History of cancer chemotherapy 07-17-2012  to  12-04-2012   right breast cancer  . HSV infection   . Hypertension   . Insomnia   . Malignant neoplasm of lower-inner quadrant of right breast of female, estrogen receptor negative [facility]) oncologist-  dr Lindi Adie--  per lov note in epic ,  no recurrence   dx 05/ 2014---  right breast IDC & DCIS, Grade 3, triple negative----  completed neo-adjuvant chemo 12-04-2012,  03-20-2013  right total mastectomy with sln bx (07-24-2013  left simple mastectomy w/ sln bx)  . Neuropathy associated with cancer ([place])    related to chemotherapy  . 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 Nocturia   . PONV (postoperative nausea and vomiting)    shaking after anesthesia; had n/v after mastectomy  . Urinary frequency   . Wears glasses     Current Outpatient Medications:  .  cephALEXin (KEFLEX) 500 MG capsule, Take 1 capsule (500 mg total) by mouth 2 (two) times daily., Disp: 14 capsule, Rfl: 0 .  gabapentin (NEURONTIN) 300 MG capsule, Take 2 capsules (600 mg total) by mouth at bedtime., Disp: 180 capsule, Rfl: 2 .  hydrochlorothiazide (HYDRODIURIL) 25 MG tablet, TAKE 1 TABLET BY MOUTH ONCE DAILY, Disp: 30 tablet, Rfl: 5 .  oxyCODONE-acetaminophen (PERCOCET) 10-325 MG tablet, Take 1 tablet by mouth every 4 (four) hours as needed for pain., Disp: 20 tablet, Rfl: 0 .  promethazine (PHENERGAN) 25 MG tablet, Take 1 tablet (25 mg total) by mouth every 8 (eight) hours as needed for nausea or  vomiting., Disp: 20 tablet, Rfl: 0 .  valACYclovir (VALTREX) 500 MG tablet, Take 500 mg by mouth., Disp: , Rfl:   Social History   Tobacco Use  Smoking Status Never Smoker  Smokeless Tobacco Never Used    Allergies  Allergen Reactions  . Lyrica [Pregabalin] Other (See Comments)    Hallucinations and bad dreams   Objective:   There were no vitals filed for this visit. There is no height or weight on file to calculate BMI. Constitutional Well developed. Well nourished.  Vascular Foot warm and well perfused. Capillary refill normal to all digits.   Neurologic Normal speech. Oriented to person, place, and time. Epicritic sensation to light touch grossly present bilaterally.  Dermatologic Skin healing well without signs of infection. Skin edges well coapted without signs of infection.  Orthopedic: Tenderness to palpation noted about the surgical site.   Radiographs: Taken and reviewed c/w post-op state. Assessment:   1. Hallux limitus, right   2. Capsulitis of metatarsophalangeal (MTP) joint of right foot   3. Hallux valgus with bunions of left foot    Plan:  Patient was evaluated and treated and all questions answered.  S/p foot surgery right -Progressing as expected post-operatively. -XR: As above -WB Status: WBAT in boot -Sutures: intact. Absorbable. Ok to shower but not soak -Medications: Percocet refilled today. -Foot redressed.  Return in about  2 weeks (around 02/24/2018) for (XRs) Post-Op F/u.

## 2018-02-20 NOTE — Op Note (Signed)
Patient Name: Jaime Murillo DOB: [birth year]  MRN: [id number]   Date of Service: 01/27/2018   Surgeon: Dr. Hardie Pulley, DPM Assistants: None Pre-operative Diagnosis: HAV Right, Metatarsal Deformity, Hallux Limitus Post-operative Diagnosis: Same Procedures:             1) [place]/Young[place] Bunionectomy Pathology/Specimens: * No specimens in log * Anesthesia: Regional, MAC Hemostasis: Anatomic Estimated Blood Loss: 5 ml Materials:  Implant Name Type Inv. Item Serial No. Manufacturer Lot No. LRB No. Used  SCREW GO EZ 3.0X16 - [id number] Screw SCREW GO EZ 3.0X16  BIO PRO [id number] Right 1  SCREW GO EZ 3.0X14 - [id number] Screw SCREW GO EZ 3.0X14  BIO PRO [id number] Right 1    Medications: None Complications: None  Indications for Procedure:  This is a 58 y.o. female with a right foot bunion with early arthritic change of the joint.  It was discussed the patient should benefit from bunion correction with plantar displacement of the metatarsal head to prevent further arthritic changes.  All risk medicines alternatives of surgery discussed with the patient no guarantees were given.   Procedure in Detail: Patient was identified in pre-operative holding area. Formal consent was signed and the right lower extremity was marked. Patient was brought back to the operating room and placed on the operating room table in the supine position. Anesthesia was induced.   The extremity was prepped and draped in the usual sterile fashion. Timeout was taken to confirm patient name, laterality, and procedure prior to incision. Attention was then directed to the right foot where a linear incision was made overlying the first metatarsophalangeal joint dissection was carried down through skin and subcutaneous tissue with care to avoid all vital neurovascular structures all bleeders were cauterized with electro cautery.  A linear incision was then made in the metatarsophalangeal joint capsule and the capsule was gently  freed from the bone.  Once the metatarsal head was adequately exposed a sagittal saw was used to resect a portion of the medial eminence.  A BioPro osteotomy guide for a Youngswick osteotomy was applied to the medial aspect of the metatarsal and the osteotomy cuts were made. A 37m wedge of the bone was removed to allow for plantar displacement of the metatarsal head.  The metatarsal head was plantar displaced and translated laterally.  The head was held temporarily in position with a K wire.  This was checked on fluoroscopy and showed adequate positioning.  The osteotomy was then fixated with two 3.0 mm EZgo screws.  Final osteotomy appeared stable.  The sagittal saw was then used to resect a portion of the dorsal prominence.  The wound was then copiously irrigated and the capsule was closed with Vicryl suture.  The skin was then closed in layers with Monocryl.  Final positioning was checked on fluoroscopy and shown to be in good position.  The toe appeared rectus.  The foot was then dressed with xeroform, 4x4, kerlix, and ACE bandage. Patient tolerated the procedure well.   Disposition: Following a period of post-operative monitoring, patient will be transferred back home.

## 2018-02-24 ENCOUNTER — Ambulatory Visit (INDEPENDENT_AMBULATORY_CARE_PROVIDER_SITE_OTHER): Payer: Medicare Other | Admitting: Podiatry

## 2018-02-24 ENCOUNTER — Ambulatory Visit (INDEPENDENT_AMBULATORY_CARE_PROVIDER_SITE_OTHER): Payer: Medicare Other

## 2018-02-24 DIAGNOSIS — M205X1 Other deformities of toe(s) (acquired), right foot: Secondary | ICD-10-CM

## 2018-02-25 NOTE — Progress Notes (Signed)
  Subjective:  Patient ID: Jaime Murillo, female    DOB: 02/24/1960,  MRN: [id number]  Chief Complaint  Patient presents with  . Foot Problem    pov#2 dos 01.03.2020 Double Osteotomy Rt -Dr. March Rummage    DOS: 01/27/17 Procedure: [place] Bunionectomy R  58 y.o. female returns for post-op check. History as above.  Having some pain mostly at night on days when she is on a lot.  Review of Systems: Negative except as noted in the HPI. Denies N/V/F/Ch.  Past Medical History:  Diagnosis Date  . Anemia   . Anxiety   . Depression   . History of cancer chemotherapy 07-17-2012  to  12-04-2012   right breast cancer  . HSV infection   . Hypertension   . Insomnia   . Malignant neoplasm of lower-inner quadrant of right breast of female, estrogen receptor negative [facility]) oncologist-  dr Lindi Adie--  per lov note in epic ,  no recurrence   dx 05/ 2014---  right breast IDC & DCIS, Grade 3, triple negative----  completed neo-adjuvant chemo 12-04-2012,  03-20-2013  right total mastectomy with sln bx (07-24-2013  left simple mastectomy w/ sln bx)  . Neuropathy associated with cancer ([place])    related to chemotherapy  . 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 Nocturia   . PONV (postoperative nausea and vomiting)    shaking after anesthesia; had n/v after mastectomy  . Urinary frequency   . Wears glasses     Current Outpatient Medications:  .  cephALEXin (KEFLEX) 500 MG capsule, Take 1 capsule (500 mg total) by mouth 2 (two) times daily., Disp: 14 capsule, Rfl: 0 .  gabapentin (NEURONTIN) 300 MG capsule, Take 2 capsules (600 mg total) by mouth at bedtime., Disp: 180 capsule, Rfl: 2 .  hydrochlorothiazide (HYDRODIURIL) 25 MG tablet, TAKE 1 TABLET BY MOUTH ONCE DAILY, Disp: 30 tablet, Rfl: 5 .  oxyCODONE-acetaminophen (PERCOCET) 10-325 MG tablet, Take 1 tablet by mouth every 4 (four) hours as needed for pain., Disp: 20 tablet, Rfl: 0 .  promethazine (PHENERGAN) 25 MG tablet, Take 1 tablet  (25 mg total) by mouth every 8 (eight) hours as needed for nausea or vomiting., Disp: 20 tablet, Rfl: 0 .  valACYclovir (VALTREX) 500 MG tablet, Take 500 mg by mouth., Disp: , Rfl:   Social History   Tobacco Use  Smoking Status Never Smoker  Smokeless Tobacco Never Used    Allergies  Allergen Reactions  . Lyrica [Pregabalin] Other (See Comments)    Hallucinations and bad dreams   Objective:   There were no vitals filed for this visit. There is no height or weight on file to calculate BMI. Constitutional Well developed. Well nourished.  Vascular Foot warm and well perfused. Capillary refill normal to all digits.   Neurologic Normal speech. Oriented to person, place, and time. Epicritic sensation to light touch grossly present bilaterally.  Dermatologic Skin healed.  Orthopedic: No tenderness to palpation noted about the surgical site.   Radiographs: Taken and reviewed showing healing of the osteotomy site. Assessment:   1. Hallux limitus, right    Plan:  Patient was evaluated and treated and all questions answered.  S/p foot surgery right -Progressing as expected post-operatively. -XR: As above -WB Status: WBAT in surgical shoe -Skin healed -Medications: None refilled. -Foot redressed.  Return in about 2 weeks (around 03/10/2018) for XRs, Post-op.

## 2018-02-27 DIAGNOSIS — G620 Drug-induced polyneuropathy: Secondary | ICD-10-CM | POA: Diagnosis not present

## 2018-02-27 DIAGNOSIS — I10 Essential (primary) hypertension: Secondary | ICD-10-CM | POA: Diagnosis not present

## 2018-02-27 DIAGNOSIS — R7303 Prediabetes: Secondary | ICD-10-CM | POA: Diagnosis not present

## 2018-02-27 DIAGNOSIS — E785 Hyperlipidemia, unspecified: Secondary | ICD-10-CM | POA: Diagnosis not present

## 2018-02-27 DIAGNOSIS — E559 Vitamin D deficiency, unspecified: Secondary | ICD-10-CM | POA: Diagnosis not present

## 2018-02-27 DIAGNOSIS — B009 Herpesviral infection, unspecified: Secondary | ICD-10-CM | POA: Diagnosis not present

## 2018-03-10 ENCOUNTER — Ambulatory Visit: Payer: Medicare Other

## 2018-03-10 ENCOUNTER — Encounter: Payer: Medicare Other | Admitting: Podiatry

## 2018-03-10 DIAGNOSIS — M205X1 Other deformities of toe(s) (acquired), right foot: Secondary | ICD-10-CM

## 2018-03-10 NOTE — Progress Notes (Signed)
This encounter was created in error - please disregard.

## 2018-03-13 ENCOUNTER — Encounter (HOSPITAL_COMMUNITY): Payer: Self-pay | Admitting: Emergency Medicine

## 2018-03-13 ENCOUNTER — Ambulatory Visit (HOSPITAL_COMMUNITY)
Admission: EM | Admit: 2018-03-13 | Discharge: 2018-03-13 | Disposition: A | Discharge status: Home / Self Care | Payer: Medicare Other | Attending: Urgent Care | Admitting: Urgent Care

## 2018-03-13 DIAGNOSIS — R69 Illness, unspecified: Secondary | ICD-10-CM

## 2018-03-13 DIAGNOSIS — Z853 Personal history of malignant neoplasm of breast: Secondary | ICD-10-CM

## 2018-03-13 DIAGNOSIS — R05 Cough: Secondary | ICD-10-CM

## 2018-03-13 DIAGNOSIS — J111 Influenza due to unidentified influenza virus with other respiratory manifestations: Secondary | ICD-10-CM

## 2018-03-13 DIAGNOSIS — R059 Cough, unspecified: Secondary | ICD-10-CM

## 2018-03-13 MED ORDER — BENZONATATE 100 MG PO CAPS: 100.0000 mg | ORAL_CAPSULE | Freq: Three times a day (TID) | ORAL | 0 refills | Status: DC | PRN

## 2018-03-13 MED ORDER — OSELTAMIVIR PHOSPHATE 75 MG PO CAPS: 75.0000 mg | ORAL_CAPSULE | Freq: Two times a day (BID) | ORAL | 0 refills | Status: DC

## 2018-03-13 MED ORDER — ONDANSETRON 8 MG PO TBDP: 8.0000 mg | ORAL_TABLET | Freq: Three times a day (TID) | ORAL | 0 refills | Status: DC | PRN

## 2018-03-13 NOTE — ED Provider Notes (Signed)
MRN: [id number] DOB: 27-Jun-1960  Subjective:   Jaime Murillo is a 58 y.o. female presenting for 2 day history of moderate/severe worsening constant malaise/body aches. Has tried otc medications, DayQuil, NyQuil with minimal relief. Denies smoking cigarettes.     No current facility-administered medications for this encounter.   Current Outpatient Medications:  .  gabapentin (NEURONTIN) 300 MG capsule, Take 2 capsules (600 mg total) by mouth at bedtime., Disp: 180 capsule, Rfl: 2 .  hydrochlorothiazide (HYDRODIURIL) 25 MG tablet, TAKE 1 TABLET BY MOUTH ONCE DAILY, Disp: 30 tablet, Rfl: 5 .  oxyCODONE-acetaminophen (PERCOCET) 10-325 MG tablet, Take 1 tablet by mouth every 4 (four) hours as needed for pain., Disp: 20 tablet, Rfl: 0 .  promethazine (PHENERGAN) 25 MG tablet, Take 1 tablet (25 mg total) by mouth every 8 (eight) hours as needed for nausea or vomiting., Disp: 20 tablet, Rfl: 0 .  valACYclovir (VALTREX) 500 MG tablet, Take 500 mg by mouth., Disp: , Rfl:     Allergies  Allergen Reactions  . Lyrica [Pregabalin] Other (See Comments)    Hallucinations and bad dreams    Past Medical History:  Diagnosis Date  . Anemia   . Anxiety   . Depression   . History of cancer chemotherapy 07-17-2012  to  12-04-2012   right breast cancer  . HSV infection   . Hypertension   . Insomnia   . Malignant neoplasm of lower-inner quadrant of right breast of female, estrogen receptor negative [facility]) oncologist-  dr Lindi Adie--  per lov note in epic ,  no recurrence   dx 05/ 2014---  right breast IDC & DCIS, Grade 3, triple negative----  completed neo-adjuvant chemo 12-04-2012,  03-20-2013  right total mastectomy with sln bx (07-24-2013  left simple mastectomy w/ sln bx)  . Neuropathy associated with cancer ([place])    related to chemotherapy  . 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 Nocturia   . PONV (postoperative nausea and vomiting)    shaking after anesthesia; had n/v after  mastectomy  . Urinary frequency   . Wears glasses      Past Surgical History:  Procedure Laterality Date  . Barbie Banner OSTEOTOMY Right 01/27/2018   Procedure: DOUBLE   [name];  Surgeon: Evelina Bucy, DPM;  Location: [place];  Service: Podiatry;  Laterality: Right;  POPLITEAL/SAPH BLOCK  . BREAST LUMPECTOMY Right 01/12/2013   Procedure: RIGHT SENTINEL LYMPH  NODE BIOPSY;  Surgeon: Stark Klein, MD;  Location: [place];  Service: General;  Laterality: Right;  RIGHT SENTINEL LYMPH NODE BIOPSY  . BREAST RECONSTRUCTION WITH PLACEMENT OF TISSUE EXPANDER AND FLEX HD (ACELLULAR HYDRATED DERMIS) Right 03/20/2013   Procedure: IMMEDIATE RIGHT BREAST RECONSTRUCTION WITH PLACEMENT OF TISSUE EXPANDER AND FLEX HD (ACELLULAR HYDRATED DERMIS);  Surgeon: Irene Limbo, MD;  Location: [place];  Service: Plastics;  Laterality: Right;  . BREAST RECONSTRUCTION WITH PLACEMENT OF TISSUE EXPANDER AND FLEX HD (ACELLULAR HYDRATED DERMIS) Left 07/24/2013   Procedure: BREAST RECONSTRUCTION WITH PLACEMENT OF TISSUE EXPANDER AND FLEX HD (ACELLULAR HYDRATED DERMIS) TO THE LEFT BREAST;  Surgeon: Irene Limbo, MD;  Location: [place];  Service: Plastics;  Laterality: Left;  . BREAST RECONSTRUCTION WITH PLACEMENT OF TISSUE EXPANDER AND FLEX HD (ACELLULAR HYDRATED DERMIS) Bilateral 01/07/2014   Procedure: BREAST RECONSTRUCTION WITH REMOVAL  OF TISSUE EXPANDER AND PLACEMENT  OF SILICONE IMPLANTS TO BILATERAL BREAST;  Surgeon: Irene Limbo, MD;  Location: [place];  Service: Clinical cytogeneticist;  Laterality: Bilateral;  . COLONOSCOPY    . LAPAROSCOPIC TUBAL LIGATION Bilateral 08-10-2001    dr Ruthann Cancer  @[facility]    with cauterization  . PORT-A-CATH REMOVAL Left 01/12/2013   Procedure: REMOVAL PORT-A-CATH;  Surgeon: Stark Klein, MD;  Location: [place];  Service: General;  Laterality: Left;  . PORTACATH PLACEMENT N/A 07/06/2012   Procedure: INSERTION PORT-A-CATH;   Surgeon: Stark Klein, MD;  Location: [place];  Service: General;  Laterality: N/A;  . SIMPLE MASTECTOMY WITH AXILLARY SENTINEL NODE BIOPSY Left 07/24/2013   Procedure: SIMPLE MASTECTOMY;  Surgeon: Stark Klein, MD;  Location: [place];  Service: General;  Laterality: Left;  . TOTAL MASTECTOMY Right 03/20/2013   Procedure: RIGHT SKIN SPARING TOTAL MASTECTOMY;  Surgeon: Stark Klein, MD;  Location: [place];  Service: General;  Laterality: Right;  . TRANSTHORACIC ECHOCARDIOGRAM  07/03/2012   ef 55-60%/  mild MR and TR/  mild dilated IVC    Review of Systems  Constitutional: Positive for chills and malaise/fatigue. Negative for fever.  HENT: Positive for congestion and sore throat. Negative for ear pain and sinus pain.   Eyes: Negative for blurred vision, double vision, discharge and redness.  Respiratory: Positive for cough (coughing fits). Negative for hemoptysis, shortness of breath and wheezing.   Cardiovascular: Positive for chest pain (from her coughing).  Gastrointestinal: Positive for nausea and vomiting. Negative for abdominal pain and diarrhea.  Genitourinary: Negative for dysuria, flank pain and hematuria.  Musculoskeletal: Positive for myalgias.  Skin: Negative for rash.  Neurological: Positive for headaches. Negative for weakness.  Psychiatric/Behavioral: Negative for depression and substance abuse.    Objective:   Vitals: BP 103/65 (BP Location: Right Arm)   Pulse 77   Temp 99.2 F (37.3 C) (Temporal)   Resp 18   LMP 07/01/2013   SpO2 100%   Physical Exam Constitutional:      General: She is not in acute distress.    Appearance: Normal appearance. She is well-developed. She is not ill-appearing, toxic-appearing or diaphoretic.  HENT:     Head: Normocephalic and atraumatic.     Right Ear: Tympanic membrane and ear canal normal. No drainage or tenderness. No middle ear effusion. Tympanic membrane is not erythematous.     Left Ear: Tympanic membrane and ear  canal normal. No drainage or tenderness.  No middle ear effusion. Tympanic membrane is not erythematous.     Nose: Nose normal. No congestion or rhinorrhea.     Mouth/Throat:     Mouth: Mucous membranes are moist. No oral lesions.     Pharynx: Oropharynx is clear. No pharyngeal swelling, oropharyngeal exudate, posterior oropharyngeal erythema or uvula swelling.     Tonsils: No tonsillar exudate or tonsillar abscesses.  Eyes:     General: No scleral icterus.       Right eye: No discharge.        Left eye: No discharge.     Extraocular Movements: Extraocular movements intact.     Right eye: Normal extraocular motion.     Left eye: Normal extraocular motion.     Conjunctiva/sclera: Conjunctivae normal.     Pupils: Pupils are equal, round, and reactive to light.  Neck:     Musculoskeletal: Normal range of motion and neck supple.  Cardiovascular:     Rate and Rhythm: Normal rate and regular rhythm.     Pulses: Normal pulses.     Heart sounds: Normal heart sounds. No murmur. No friction rub. No gallop.  Pulmonary:     Effort: Pulmonary effort is normal. No respiratory distress.     Breath sounds: Normal breath sounds. No stridor. No wheezing, rhonchi or rales.  Abdominal:     General: Bowel sounds are normal. There is no distension.     Palpations: Abdomen is soft.     Tenderness: There is no abdominal tenderness. There is no right CVA tenderness, left CVA tenderness or guarding.  Lymphadenopathy:     Cervical: No cervical adenopathy.  Skin:    General: Skin is warm and dry.     Findings: No rash.  Neurological:     General: No focal deficit present.     Mental Status: She is alert and oriented to person, place, and time.  Psychiatric:        Mood and Affect: Mood normal.        Behavior: Behavior normal.        Thought Content: Thought content normal.     Assessment and Plan :   Influenza-like illness  Cough  History of breast cancer  Will cover for influenza with  Tamiflu given symptom set, acute onset, physical exam findings.  Tessalon for cough suppression and Zofran for her nausea and vomiting.  Use supportive care, rest, fluids, hydration, light meals, schedule Tylenol and ibuprofen. Counseled patient on potential for adverse effects with medications prescribed today, patient verbalized understanding. ER and return-to-clinic precautions discussed, patient verbalized understanding.   Jaynee Eagles, PA-C 03/13/18 1351

## 2018-03-13 NOTE — ED Triage Notes (Signed)
Pt sts body aches, cough and vomiting; pt sts right toe pain that recently had sx on

## 2018-03-13 NOTE — Discharge Instructions (Signed)
We will manage this as a viral syndrome. For sore throat or cough try using a honey-based tea. Use 3 teaspoons of honey with juice squeezed from half lemon. Place shaved pieces of ginger into 1/2-1 cup of water and warm over stove top. Then mix the ingredients and repeat every 4 hours as needed. Please take ibuprofen 400mg  every 6 hours alternating with OR taken together with Tylenol 500mg  every 6 hours. Hydrate very well with at least 2 liters of water. Eat light meals such as soups to replenish electrolytes and soft fruits, veggies. Start an antihistamine like Zyrtec, Allegra or Claritin. You may pick up over-the-counter pseudoephedrine (Sudafed) and use this for post-nasal drainage, sinus congestion at a dose of 60mg  every 8 hours or 120mg  every 12 hours as needed.

## 2018-03-14 ENCOUNTER — Telehealth (HOSPITAL_COMMUNITY): Payer: Self-pay | Admitting: Emergency Medicine

## 2018-03-14 NOTE — Telephone Encounter (Signed)
Pt called asking for cheaper cough medicine. Given [email or web address] info. Or otc medicine.

## 2018-03-24 ENCOUNTER — Encounter: Payer: Self-pay | Admitting: Podiatry

## 2018-03-24 ENCOUNTER — Ambulatory Visit (INDEPENDENT_AMBULATORY_CARE_PROVIDER_SITE_OTHER): Payer: Medicare Other | Admitting: Podiatry

## 2018-03-24 ENCOUNTER — Ambulatory Visit (INDEPENDENT_AMBULATORY_CARE_PROVIDER_SITE_OTHER): Payer: Medicare Other

## 2018-03-24 DIAGNOSIS — M205X1 Other deformities of toe(s) (acquired), right foot: Secondary | ICD-10-CM

## 2018-03-24 DIAGNOSIS — Z9889 Other specified postprocedural states: Secondary | ICD-10-CM

## 2018-03-24 NOTE — Progress Notes (Signed)
  Subjective:  Patient ID: Jaime Murillo, female    DOB: 07-Nov-1960,  MRN: [id number]  Chief Complaint  Patient presents with  . Routine Post [facility] 01.03.2020 Double Osteotomy Rt   "Its doing real good"    DOS: 01/27/17 Procedure: [place] Bunionectomy R  58 y.o. female returns for post-op check. History as above. Doing well not having pain wearing normal soft shoes without pain or discomfort.  Review of Systems: Negative except as noted in the HPI. Denies N/V/F/Ch.  Past Medical History:  Diagnosis Date  . Anemia   . Anxiety   . Depression   . History of cancer chemotherapy 07-17-2012  to  12-04-2012   right breast cancer  . HSV infection   . Hypertension   . Insomnia   . Malignant neoplasm of lower-inner quadrant of right breast of female, estrogen receptor negative [facility]) oncologist-  dr Lindi Adie--  per lov note in epic ,  no recurrence   dx 05/ 2014---  right breast IDC & DCIS, Grade 3, triple negative----  completed neo-adjuvant chemo 12-04-2012,  03-20-2013  right total mastectomy with sln bx (07-24-2013  left simple mastectomy w/ sln bx)  . Neuropathy associated with cancer ([place])    related to chemotherapy  . 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 Nocturia   . PONV (postoperative nausea and vomiting)    shaking after anesthesia; had n/v after mastectomy  . Urinary frequency   . Wears glasses     Current Outpatient Medications:  .  gabapentin (NEURONTIN) 300 MG capsule, Take 2 capsules (600 mg total) by mouth at bedtime., Disp: 180 capsule, Rfl: 2 .  hydrochlorothiazide (HYDRODIURIL) 25 MG tablet, TAKE 1 TABLET BY MOUTH ONCE DAILY, Disp: 30 tablet, Rfl: 5 .  valACYclovir (VALTREX) 500 MG tablet, Take 500 mg by mouth., Disp: , Rfl:   Social History   Tobacco Use  Smoking Status Never Smoker  Smokeless Tobacco Never Used    Allergies  Allergen Reactions  . Lyrica [Pregabalin] Other (See Comments)    Hallucinations and bad dreams    Objective:   There were no vitals filed for this visit. There is no height or weight on file to calculate BMI. Constitutional Well developed. Well nourished.  Vascular Foot warm and well perfused. Capillary refill normal to all digits.   Neurologic Normal speech. Oriented to person, place, and time. Epicritic sensation to light touch grossly present bilaterally.  Dermatologic Skin healed.  Orthopedic: No tenderness to palpation noted about the surgical site. Slight reducible stiffness of the joint without osseous restriction.   Radiographs: Taken and reviewed healed osteotomy. Assessment:   1. Hallux limitus, right   2. Status post right foot surgery    Plan:  Patient was evaluated and treated and all questions answered.  S/p foot surgery right -Progressing as expected post-operatively. -XR: As above -WB Status: WBAT in normal shoe -Skin healed -Medications: None refilled. -Foot redressed. -Work on hallux ROM.  Return in about 4 weeks (around 04/21/2018) for Post-op no XRs .

## 2018-04-07 ENCOUNTER — Telehealth: Payer: Self-pay | Admitting: Adult Health

## 2018-04-07 ENCOUNTER — Encounter: Payer: Medicaid Other | Admitting: Adult Health

## 2018-04-07 NOTE — Telephone Encounter (Signed)
Spoke with patient re new appointment 3/30.

## 2018-04-24 ENCOUNTER — Encounter: Payer: Medicare Other | Admitting: Adult Health

## 2018-05-04 ENCOUNTER — Encounter: Payer: Medicare Other | Admitting: Podiatry

## 2018-08-01 ENCOUNTER — Telehealth: Payer: Self-pay | Admitting: Adult Health

## 2018-08-01 NOTE — Telephone Encounter (Signed)
Faxed medical records for the past five years to [facility] @ [phone]

## 2018-09-01 ENCOUNTER — Other Ambulatory Visit: Payer: Self-pay

## 2018-09-01 ENCOUNTER — Inpatient Hospital Stay: Payer: Medicare Other | Attending: Adult Health | Admitting: Adult Health

## 2018-09-01 ENCOUNTER — Encounter: Payer: Self-pay | Admitting: Adult Health

## 2018-09-01 ENCOUNTER — Telehealth: Payer: Self-pay | Admitting: Adult Health

## 2018-09-01 VITALS — BP 132/75 | HR 68 | Temp 98.2°F | Resp 18 | Wt 128.9 lb

## 2018-09-01 DIAGNOSIS — Z171 Estrogen receptor negative status [ER-]: Secondary | ICD-10-CM | POA: Diagnosis not present

## 2018-09-01 DIAGNOSIS — Z79899 Other long term (current) drug therapy: Secondary | ICD-10-CM | POA: Diagnosis not present

## 2018-09-01 DIAGNOSIS — Z9011 Acquired absence of right breast and nipple: Secondary | ICD-10-CM | POA: Insufficient documentation

## 2018-09-01 DIAGNOSIS — C50311 Malignant neoplasm of lower-inner quadrant of right female breast: Secondary | ICD-10-CM | POA: Diagnosis not present

## 2018-09-01 DIAGNOSIS — K7689 Other specified diseases of liver: Secondary | ICD-10-CM | POA: Insufficient documentation

## 2018-09-01 DIAGNOSIS — Z8249 Family history of ischemic heart disease and other diseases of the circulatory system: Secondary | ICD-10-CM | POA: Diagnosis not present

## 2018-09-01 DIAGNOSIS — Z800 Family history of malignant neoplasm of digestive organs: Secondary | ICD-10-CM | POA: Diagnosis not present

## 2018-09-01 DIAGNOSIS — Z8042 Family history of malignant neoplasm of prostate: Secondary | ICD-10-CM | POA: Diagnosis not present

## 2018-09-01 DIAGNOSIS — T451X5A Adverse effect of antineoplastic and immunosuppressive drugs, initial encounter: Secondary | ICD-10-CM

## 2018-09-01 DIAGNOSIS — Z8041 Family history of malignant neoplasm of ovary: Secondary | ICD-10-CM | POA: Diagnosis not present

## 2018-09-01 DIAGNOSIS — Z809 Family history of malignant neoplasm, unspecified: Secondary | ICD-10-CM | POA: Insufficient documentation

## 2018-09-01 DIAGNOSIS — N941 Unspecified dyspareunia: Secondary | ICD-10-CM | POA: Diagnosis not present

## 2018-09-01 DIAGNOSIS — G620 Drug-induced polyneuropathy: Secondary | ICD-10-CM

## 2018-09-01 DIAGNOSIS — Z9221 Personal history of antineoplastic chemotherapy: Secondary | ICD-10-CM | POA: Diagnosis not present

## 2018-09-01 NOTE — Telephone Encounter (Signed)
I talk with patient regarding schedule  

## 2018-09-01 NOTE — Progress Notes (Signed)
CLINIC:  Survivorship   REASON FOR VISIT:  Routine follow-up for history of breast cancer.   BRIEF ONCOLOGIC HISTORY:  Oncology History  Cancer of lower-inner quadrant of RIGHT female breast  06/20/2012 Initial Biopsy   Right breast needle core biopsy (3 o'clock): Grade 3, IDC & DCIS.  ER- (0%), PR- (0%), HER2- (ratio 1.26). Ki67 95%.    06/20/2012 Breast US   Right breast with slightly irregular oval hypoechoic mass measuring 7 x 6 x 17 mm at 3 o'clock postion. No mammographic evidence of breast malignancy bilaterally. Palpable area of concern at 12 o'clock, but no suspicious findings at this location on [place].    06/23/2012 Initial Diagnosis   Cancer of lower-inner quadrant of RIGHT female breast   06/26/2012 Breast MRI   Right breast w/clumped NME at 2 o'clock position measuring 0.8 x 1.6 x 2.2 cm located anterior/superior to known cancer. Also 6 mm ill-defined ovoid mass posterior lateral to bx-proven ca, lying superficial pect. muscle. Likely multifocal disease.    07/03/2012 Echocardiogram   Pre-treatment EF: 55-60%   07/03/2012 Imaging   CT c/a/p: No evidence of metastatic disease.    07/05/2012 Procedure   Genetic counseling/testing: OncoGeneDX with 3 VUS identified. 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  Also heteroxygous for 2 likely benign variants on BRCA1 and NBN.  Remainder of genes in GeneDX panel negative.    07/12/2012 Procedure   Right breast needle core biopsy: Benign breast tissue with focal pseudoangiomatous stromal hyperplasic (PASH). No evidence of malignancy.    07/17/2012 - 08/28/2012 Neo-Adjuvant Chemotherapy   Adriamycin & Cytoxan x 4 cycles completed.    09/11/2012 - 10/23/2012 Neo-Adjuvant Chemotherapy   Taxol/Carboplatin weekly x 7 cycles (stopped due to peripheral neuropathy; changed to Gemcitabine/Carbo for additional 3 cycles).   11/06/2012 - 12/04/2012 Neo-Adjuvant Chemotherapy   Gemcitabine/Carboplatin x 3 cycles completed.     11/24/2012 Breast MRI   Previous right breast multifocal malignancy with complete imaging resolution.  Stable 6 mm hemangioma or cyst in liver.    01/12/2013 Surgery   Right axillary SLNB ([place]):  3 SLNs negative.  Port also removed on this date.    03/20/2013 Definitive Surgery   Right skin-sparing total mastectomy [facility]) with immediate reconstruction: No atypia or malignancy identified.  Benign skin & nipple. s/p neoadjuvant therapy.  Represents a complete response. Placement of tissue expanders.    03/20/2013 Pathologic Stage   ypT0; No evidence of cancer.    07/24/2013 Surgery   LEFT simple mastectomy with immediate reconstruction; showed ALH and fibroadenoma. Placement of tissue expanders.    01/07/2014 Surgery   Bilateral breast reconstruction ([place]): Removal of bilateral tissue expanders and placement of bilateral silicone breast implants.    06/03/2014 Survivorship   Survivorship Care Plan given to patient and reveiwed with her during in-person visit.       INTERVAL HISTORY:  Jaime Murillo presents to the Survivorship Clinic today for routine follow-up for her history of breast cancer.  Overall, she reports feeling quite well.  She graduated from [employer], and is very happy to be done.  She is now selling life insurance.  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  She tolerates this well.  She notes sometimes she may miss a dose.  She does continue to have some pain in her feet.  She has tried Cymbalta before and it didn't work well for her.    She continues to see her PCP regularly.  She thinks she may  be due for colon cancer screening.  She hasn't seen gyn in a while, and notes she plans on following up soon.  Jaime Murillo is having increased dyspareunia.  She is using increased lubrication, but is having a challenging time with this.    She is seeing podiatry for neuropathy and arthritis  She is also going to f/u with them soon about a new bunion.   Jaime Murillo is exercising regularly, about 30 minutes a day.     REVIEW OF SYSTEMS:  Review of Systems  Constitutional: Negative for appetite change, chills, fatigue, fever and unexpected weight change.  HENT:   Negative for hearing loss, lump/mass and trouble swallowing.   Eyes: Negative for eye problems and icterus.  Respiratory: Negative for chest tightness, cough and shortness of breath.   Cardiovascular: Negative for chest pain, leg swelling and palpitations.  Gastrointestinal: Negative for abdominal distention, abdominal pain, constipation, diarrhea, nausea and vomiting.  Endocrine: Negative for hot flashes.  Skin: Negative for itching and rash.  Neurological: Negative for dizziness, extremity weakness, headaches and numbness.  Hematological: Negative for adenopathy. Does not bruise/bleed easily.  Psychiatric/Behavioral: Negative for depression. The patient is not nervous/anxious.   Breast: Denies any new nodularity, masses, tenderness, nipple changes, or nipple discharge.       PAST MEDICAL/SURGICAL HISTORY:  Past Medical History:  Diagnosis Date  . Anemia   . Anxiety   . Depression   . History of cancer chemotherapy 07-17-2012  to  12-04-2012   right breast cancer  . HSV infection   . Hypertension   . Insomnia   . Malignant neoplasm of lower-inner quadrant of right breast of female, estrogen receptor negative [facility]) oncologist-  dr Lindi Adie--  per lov note in epic ,  no recurrence   dx 05/ 2014---  right breast IDC & DCIS, Grade 3, triple negative----  completed neo-adjuvant chemo 12-04-2012,  03-20-2013  right total mastectomy with sln bx (07-24-2013  left simple mastectomy w/ sln bx)  . Neuropathy associated with cancer ([place])    related to chemotherapy  . 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 Nocturia   . PONV (postoperative nausea and vomiting)    shaking after anesthesia; had n/v after mastectomy  . Urinary frequency   . Wears glasses    Past Surgical  History:  Procedure Laterality Date  . Barbie Banner OSTEOTOMY Right 01/27/2018   Procedure: DOUBLE   [name];  Surgeon: Evelina Bucy, DPM;  Location: [place];  Service: Podiatry;  Laterality: Right;  POPLITEAL/SAPH BLOCK  . BREAST LUMPECTOMY Right 01/12/2013   Procedure: RIGHT SENTINEL LYMPH  NODE BIOPSY;  Surgeon: Stark Klein, MD;  Location: [place];  Service: General;  Laterality: Right;  RIGHT SENTINEL LYMPH NODE BIOPSY  . BREAST RECONSTRUCTION WITH PLACEMENT OF TISSUE EXPANDER AND FLEX HD (ACELLULAR HYDRATED DERMIS) Right 03/20/2013   Procedure: IMMEDIATE RIGHT BREAST RECONSTRUCTION WITH PLACEMENT OF TISSUE EXPANDER AND FLEX HD (ACELLULAR HYDRATED DERMIS);  Surgeon: Irene Limbo, MD;  Location: [place];  Service: Plastics;  Laterality: Right;  . BREAST RECONSTRUCTION WITH PLACEMENT OF TISSUE EXPANDER AND FLEX HD (ACELLULAR HYDRATED DERMIS) Left 07/24/2013   Procedure: BREAST RECONSTRUCTION WITH PLACEMENT OF TISSUE EXPANDER AND FLEX HD (ACELLULAR HYDRATED DERMIS) TO THE LEFT BREAST;  Surgeon: Irene Limbo, MD;  Location: [place];  Service: Plastics;  Laterality: Left;  . BREAST RECONSTRUCTION WITH PLACEMENT OF TISSUE EXPANDER AND FLEX HD (ACELLULAR HYDRATED DERMIS) Bilateral 01/07/2014  Procedure: BREAST RECONSTRUCTION WITH REMOVAL  OF TISSUE EXPANDER AND PLACEMENT  OF SILICONE IMPLANTS TO BILATERAL BREAST;  Surgeon: Irene Limbo, MD;  Location: [place];  Service: Plastics;  Laterality: Bilateral;  . COLONOSCOPY    . LAPAROSCOPIC TUBAL LIGATION Bilateral 08-10-2001    dr Ruthann Cancer  _0    with cauterization  . PORT-A-CATH REMOVAL Left 01/12/2013   Procedure: REMOVAL PORT-A-CATH;  Surgeon: Stark Klein, MD;  Location: [place];  Service: General;  Laterality: Left;  . PORTACATH PLACEMENT N/A 07/06/2012   Procedure: INSERTION PORT-A-CATH;  Surgeon: Stark Klein, MD;  Location: [place];  Service: General;  Laterality:  N/A;  . SIMPLE MASTECTOMY WITH AXILLARY SENTINEL NODE BIOPSY Left 07/24/2013   Procedure: SIMPLE MASTECTOMY;  Surgeon: Stark Klein, MD;  Location: [place];  Service: General;  Laterality: Left;  . TOTAL MASTECTOMY Right 03/20/2013   Procedure: RIGHT SKIN SPARING TOTAL MASTECTOMY;  Surgeon: Stark Klein, MD;  Location: [place];  Service: General;  Laterality: Right;  . TRANSTHORACIC ECHOCARDIOGRAM  07/03/2012   ef 55-60%/  mild MR and TR/  mild dilated IVC     ALLERGIES:  Allergies  Allergen Reactions  . Lyrica [Pregabalin] Other (See Comments)    Hallucinations and bad dreams     CURRENT MEDICATIONS:  Outpatient Encounter Medications as of 09/01/2018  Medication Sig  . gabapentin (NEURONTIN) 300 MG capsule Take 2 capsules (600 mg total) by mouth at bedtime.  . hydrochlorothiazide (HYDRODIURIL) 25 MG tablet TAKE 1 TABLET BY MOUTH ONCE DAILY  . valACYclovir (VALTREX) 500 MG tablet Take 500 mg by mouth.  . [DISCONTINUED] prochlorperazine (COMPAZINE) 10 MG tablet Take 1 tablet (10 mg total) by mouth every 6 (six) hours as needed (Nausea or vomiting).   No facility-administered encounter medications on file as of 09/01/2018.      ONCOLOGIC FAMILY HISTORY:  Family History  Problem Relation Age of Onset  . Heart disease Mother   . Aneurysm Mother   . Hypertension Father   . Prostate cancer Father 68  . Hypertension Sister   . Ovarian cancer Paternal Grandmother        died in her 86s  . Stomach cancer Maternal Grandmother 69  . Cancer Cousin        paternal cousin with an unknown form of cancer    GENETIC COUNSELING/TESTING: See above  SOCIAL HISTORY:  Social History   Socioeconomic History  . Marital status: Single    Spouse name: Not on file  . Number of children: 2  . Years of education: 25  . Highest education level: Not on file  Occupational History    Comment: Disabled  Social Needs  . Financial resource strain: Not on file  . Food insecurity     Worry: Not on file    Inability: Not on file  . Transportation needs    Medical: Not on file    Non-medical: Not on file  Tobacco Use  . Smoking status: Never Smoker  . Smokeless tobacco: Never Used  Substance and Sexual Activity  . Alcohol use: No    Alcohol/week: 0.0 standard drinks  . Drug use: No  . Sexual activity: Yes    Birth control/protection: Surgical    Comment: BTL  Lifestyle  . Physical activity    Days per week: Not on file    Minutes per session: Not on file  . Stress: Not on file  Relationships  . Social Herbalist on phone: Not  on file    Gets together: Not on file    Attends religious service: Not on file    Active member of club or organization: Not on file    Attends meetings of clubs or organizations: Not on file    Relationship status: Not on file  . Intimate partner violence    Fear of current or ex partner: Not on file    Emotionally abused: Not on file    Physically abused: Not on file    Forced sexual activity: Not on file  Other Topics Concern  . Not on file  Social History Narrative   Patient lives at home with her son she is single.   Disabled.   Education- One year of college.   Right handed.   Caffeine- One cup daily.   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 To graduate May 2020       PHYSICAL EXAMINATION:  Vital Signs: Vitals:   09/01/18 1013  BP: 132/75  Pulse: 68  Resp: 18  Temp: 98.2 F (36.8 C)  SpO2: 99%   Filed Weights   09/01/18 1013  Weight: 128 lb 14.4 oz (58.5 kg)   General: Well-nourished, well-appearing female in no acute distress.  Unaccompanied today.   HEENT: Head is normocephalic.  Pupils equal and reactive to light. Conjunctivae clear without exudate.  Sclerae anicteric. Oral mucosa is pink, moist.  Oropharynx is pink without lesions or erythema.  Lymph: No cervical, supraclavicular, or infraclavicular lymphadenopathy noted on palpation.  Cardiovascular: Regular rate and  rhythm.Marland Kitchen Respiratory: Clear to auscultation bilaterally. Chest expansion symmetric; breathing non-labored.  Breast Exam:  -Breasts: bilateral breasts s/p mastectomy and reconstruction, no nodules or swelling noted -Axilla: No axillary adenopathy bilaterally.  GI: Abdomen soft and round; non-tender, non-distended. Bowel sounds normoactive. No hepatosplenomegaly.   GU: Deferred.  Neuro: No focal deficits. Steady gait.  Psych: Mood and affect normal and appropriate for situation.  MSK: No focal spinal tenderness to palpation, full range of motion in bilateral upper extremities Extremities: No edema. Skin: Warm and dry.  LABORATORY DATA:  None for this visit   DIAGNOSTIC IMAGING:  Most recent mammogram:   N/a s/p bilateral mastecomies    ASSESSMENT AND PLAN:  Jaime Murillo is a pleasant 58 y.o. female with history of Stage IA right breast invasive ductal carcinoma, ER-/PR-/HER2-, diagnosed in 06/2012, treated with neoadjuvant chemotherapy and bilateral mastectomies.  She presents to the Survivorship Clinic for surveillance and routine follow-up.   1. History of breast cancer:  Jaime Murillo is currently clinically and radiographically without evidence of disease or recurrence of breast cancer. She will return to the cancer center in one year for routine surveillance and monitoring.  She sees Dr. Iran Planas every December for her to f/u on her implants and plastic surgery.  I encouraged her to call me with any questions or concerns before her next visit at the cancer center, and I would be happy to see her sooner, if needed.    2. Chemotherapy induced peripheral neuropathy: She is f/u with podiatry about this.  She is taking Gabapentin.  We discussed trying cymbalta, however she says she didn't tolerate this previously.  This is stable.  3. Dyspareunia: I placed a referral to pelvic rehab for her to discuss further and undergo evaluation.  4. Cancer screening:  Due to Jaime Murillo's history and her  age, she should receive screening for skin cancers, colon cancer, and gynecologic cancers. She was encouraged to follow-up with  her PCP for appropriate cancer screenings.   5. Health maintenance and wellness promotion: Jaime Murillo was encouraged to consume 5-7 servings of fruits and vegetables per day. She was also encouraged to engage in moderate to vigorous exercise for 30 minutes per day most days of the week. She was instructed to limit her alcohol consumption and continue to abstain from tobacco use.    Dispo:  -Return to cancer center in one year for LTS follow up -Refer to PT -F/u with Dr. Iran Planas in 12/2018   A total of (30) minutes of face-to-face time was spent with this patient with greater than 50% of that time in counseling and care-coordination.   Gardenia Phlegm, NP Survivorship Program [facility] [phone]   Note: PRIMARY CARE PROVIDER Benito Mccreedy, [place] [phone]

## 2018-09-04 DIAGNOSIS — I10 Essential (primary) hypertension: Secondary | ICD-10-CM | POA: Diagnosis not present

## 2018-09-04 DIAGNOSIS — Z0001 Encounter for general adult medical examination with abnormal findings: Secondary | ICD-10-CM | POA: Diagnosis not present

## 2018-09-04 DIAGNOSIS — R7303 Prediabetes: Secondary | ICD-10-CM | POA: Diagnosis not present

## 2018-09-04 DIAGNOSIS — E559 Vitamin D deficiency, unspecified: Secondary | ICD-10-CM | POA: Diagnosis not present

## 2018-09-04 DIAGNOSIS — Z1389 Encounter for screening for other disorder: Secondary | ICD-10-CM | POA: Diagnosis not present

## 2018-09-04 DIAGNOSIS — G620 Drug-induced polyneuropathy: Secondary | ICD-10-CM | POA: Diagnosis not present

## 2018-09-04 DIAGNOSIS — B009 Herpesviral infection, unspecified: Secondary | ICD-10-CM | POA: Diagnosis not present

## 2018-09-04 DIAGNOSIS — E785 Hyperlipidemia, unspecified: Secondary | ICD-10-CM | POA: Diagnosis not present

## 2018-09-05 ENCOUNTER — Ambulatory Visit: Payer: Medicare Other | Attending: Adult Health | Admitting: Physical Therapy

## 2018-09-05 DIAGNOSIS — M6281 Muscle weakness (generalized): Secondary | ICD-10-CM | POA: Insufficient documentation

## 2018-09-05 DIAGNOSIS — R252 Cramp and spasm: Secondary | ICD-10-CM | POA: Insufficient documentation

## 2018-09-07 ENCOUNTER — Ambulatory Visit (INDEPENDENT_AMBULATORY_CARE_PROVIDER_SITE_OTHER): Payer: Medicare Other | Admitting: Podiatry

## 2018-09-07 ENCOUNTER — Encounter: Payer: Self-pay | Admitting: Podiatry

## 2018-09-07 ENCOUNTER — Ambulatory Visit (INDEPENDENT_AMBULATORY_CARE_PROVIDER_SITE_OTHER): Payer: Medicare Other

## 2018-09-07 ENCOUNTER — Other Ambulatory Visit: Payer: Self-pay

## 2018-09-07 DIAGNOSIS — Z9889 Other specified postprocedural states: Secondary | ICD-10-CM | POA: Diagnosis not present

## 2018-09-07 DIAGNOSIS — M21612 Bunion of left foot: Secondary | ICD-10-CM | POA: Diagnosis not present

## 2018-09-07 DIAGNOSIS — M79671 Pain in right foot: Secondary | ICD-10-CM

## 2018-09-07 DIAGNOSIS — M21611 Bunion of right foot: Secondary | ICD-10-CM | POA: Diagnosis not present

## 2018-09-07 DIAGNOSIS — M2022 Hallux rigidus, left foot: Secondary | ICD-10-CM

## 2018-09-07 DIAGNOSIS — M79672 Pain in left foot: Secondary | ICD-10-CM | POA: Diagnosis not present

## 2018-09-07 DIAGNOSIS — M2021 Hallux rigidus, right foot: Secondary | ICD-10-CM

## 2018-09-07 DIAGNOSIS — M205X1 Other deformities of toe(s) (acquired), right foot: Secondary | ICD-10-CM

## 2018-09-07 NOTE — Progress Notes (Signed)
Subjective:  Patient ID: Jaime Murillo, female    DOB: [birth year],  MRN: [id number]  Chief Complaint  Patient presents with  . Foot Pain    R great toe; "had prev. surgery on it, just concerned about it sticking up, not wanting to come down"; L- medial of great toe "bunion bothering me now, had it for years"   DOS: 01/27/17 Procedure: [place] Bunionectomy R  58 y.o. female returns for post-op check. History as above. Doing well with her right foot not having pain or discomfort or shoegear issues but states that the toe sticks up slightly, worse in the AM but gets better throughout the day.  Wants to discuss the left foot. Doesn't have pain but would like to discuss if she needs surgery.  Review of Systems: Negative except as noted in the HPI. Denies N/V/F/Ch.  Past Medical History:  Diagnosis Date  . Anemia   . Anxiety   . Depression   . History of cancer chemotherapy 07-17-2012  to  12-04-2012   right breast cancer  . HSV infection   . Hypertension   . Insomnia   . Malignant neoplasm of lower-inner quadrant of right breast of female, estrogen receptor negative [facility]) oncologist-  dr Lindi Adie--  per lov note in epic ,  no recurrence   dx 05/ 2014---  right breast IDC & DCIS, Grade 3, triple negative----  completed neo-adjuvant chemo 12-04-2012,  03-20-2013  right total mastectomy with sln bx (07-24-2013  left simple mastectomy w/ sln bx)  . Neuropathy associated with cancer ([place])    related to chemotherapy  . 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 Nocturia   . PONV (postoperative nausea and vomiting)    shaking after anesthesia; had n/v after mastectomy  . Urinary frequency   . Wears glasses     Current Outpatient Medications:  .  gabapentin (NEURONTIN) 300 MG capsule, Take 2 capsules (600 mg total) by mouth at bedtime., Disp: 180 capsule, Rfl: 2 .  hydrochlorothiazide (HYDRODIURIL) 25 MG tablet, TAKE 1 TABLET BY MOUTH ONCE DAILY, Disp: 30 tablet, Rfl: 5 .   valACYclovir (VALTREX) 500 MG tablet, Take 500 mg by mouth., Disp: , Rfl:   Social History   Tobacco Use  Smoking Status Never Smoker  Smokeless Tobacco Never Used    Allergies  Allergen Reactions  . Lyrica [Pregabalin] Other (See Comments)    Hallucinations and bad dreams   Objective:   There were no vitals filed for this visit. There is no height or weight on file to calculate BMI. Constitutional Well developed. Well nourished.  Vascular Foot warm and well perfused. Capillary refill normal to all digits.   Neurologic Normal speech. Oriented to person, place, and time. Epicritic sensation to light touch grossly present bilaterally.  Dermatologic Skin healed.  Orthopedic: Right 1st MPJ good ROM, no crepitus, no painm toe rectus Left 1st MPJ decreased ROM, dorsal proximence, no pain to palpation, slight HAV   Radiographs: Taken and reviewed healed osteotomy right with some degenerative changes 1st MPJ. Left 1st MPJ with subchondral sclerosis, met primus elevatus with dorsal flag sign. Assessment:   1. Bunion, left   2. Bunion, right   3. Status post right foot surgery   4. Acquired hallux limitus of both feet   5. Pain in both feet    Plan:  Patient was evaluated and treated and all questions answered.  Hallux Limitus Bilat, S/p Youngswick Bunionectomy R -Right Foot  -  Doing well, no pain or shoegear limitation, only slight extensus.   -Dispense toe alignment splint, medically necessary to help stretch the toe in plantarflexion. Advised on use.  -Discussed continued ROM exercises -Left Foot  -No pain, discussed holding off on surgery until she has pain at the area  No follow-ups on file.

## 2018-09-22 ENCOUNTER — Other Ambulatory Visit: Payer: Self-pay

## 2018-09-22 ENCOUNTER — Ambulatory Visit: Payer: Medicare Other | Admitting: Physical Therapy

## 2018-09-22 ENCOUNTER — Encounter: Payer: Self-pay | Admitting: Physical Therapy

## 2018-09-22 DIAGNOSIS — M6281 Muscle weakness (generalized): Secondary | ICD-10-CM

## 2018-09-22 DIAGNOSIS — R252 Cramp and spasm: Secondary | ICD-10-CM

## 2018-09-22 NOTE — Therapy (Addendum)
[facility] [address] [place], [place], [address] Phone: [phone]   Fax:  [phone]  Physical Therapy Evaluation  Patient Details  Name: Jaime Murillo MRN: [id number] Date of Birth: 1960/04/01 Referring Provider (PT): Gardenia Phlegm, NP   Encounter Date: 09/22/2018    PT End of Session - 09/22/18 1217    Visit Number  1    Date for PT Re-Evaluation  11/17/18    PT Start Time  0934    PT Stop Time  1017    PT Time Calculation (min)  43 min    Activity Tolerance  Patient tolerated treatment well    Behavior During Therapy  [facility] for tasks assessed/performed        Past Medical History:  Diagnosis Date  . Anemia   . Anxiety   . Depression   . History of cancer chemotherapy 07-17-2012  to  12-04-2012   right breast cancer  . HSV infection   . Hypertension   . Insomnia   . Malignant neoplasm of lower-inner quadrant of right breast of female, estrogen receptor negative [facility]) oncologist-  dr Lindi Adie--  per lov note in epic ,  no recurrence   dx 05/ 2014---  right breast IDC & DCIS, Grade 3, triple negative----  completed neo-adjuvant chemo 12-04-2012,  03-20-2013  right total mastectomy with sln bx (07-24-2013  left simple mastectomy w/ sln bx)  . Neuropathy associated with cancer ([place])    related to chemotherapy  . 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 Nocturia   . PONV (postoperative nausea and vomiting)    shaking after anesthesia; had n/v after mastectomy  . Urinary frequency   . Wears glasses     Past Surgical History:  Procedure Laterality Date  . Barbie Banner OSTEOTOMY Right 01/27/2018   Procedure: DOUBLE   [name];  Surgeon: Evelina Bucy, DPM;  Location: [place];  Service: Podiatry;  Laterality: Right;  POPLITEAL/SAPH BLOCK  . BREAST LUMPECTOMY Right 01/12/2013   Procedure: RIGHT SENTINEL LYMPH  NODE BIOPSY;  Surgeon: Stark Klein, MD;   Location: [place];  Service: General;  Laterality: Right;  RIGHT SENTINEL LYMPH NODE BIOPSY  . BREAST RECONSTRUCTION WITH PLACEMENT OF TISSUE EXPANDER AND FLEX HD (ACELLULAR HYDRATED DERMIS) Right 03/20/2013   Procedure: IMMEDIATE RIGHT BREAST RECONSTRUCTION WITH PLACEMENT OF TISSUE EXPANDER AND FLEX HD (ACELLULAR HYDRATED DERMIS);  Surgeon: Irene Limbo, MD;  Location: [place];  Service: Plastics;  Laterality: Right;  . BREAST RECONSTRUCTION WITH PLACEMENT OF TISSUE EXPANDER AND FLEX HD (ACELLULAR HYDRATED DERMIS) Left 07/24/2013   Procedure: BREAST RECONSTRUCTION WITH PLACEMENT OF TISSUE EXPANDER AND FLEX HD (ACELLULAR HYDRATED DERMIS) TO THE LEFT BREAST;  Surgeon: Irene Limbo, MD;  Location: [place];  Service: Plastics;  Laterality: Left;  . BREAST RECONSTRUCTION WITH PLACEMENT OF TISSUE EXPANDER AND FLEX HD (ACELLULAR HYDRATED DERMIS) Bilateral 01/07/2014   Procedure: BREAST RECONSTRUCTION WITH REMOVAL  OF TISSUE EXPANDER AND PLACEMENT  OF SILICONE IMPLANTS TO BILATERAL BREAST;  Surgeon: Irene Limbo, MD;  Location: [place];  Service: Plastics;  Laterality: Bilateral;  . COLONOSCOPY    . LAPAROSCOPIC TUBAL LIGATION Bilateral 08-10-2001    dr Ruthann Cancer  @[facility]    with cauterization  . PORT-A-CATH REMOVAL Left 01/12/2013   Procedure: REMOVAL PORT-A-CATH;  Surgeon: Stark Klein, MD;  Location: [place];  Service: General;  Laterality: Left;  . PORTACATH PLACEMENT N/A  07/06/2012   Procedure: INSERTION PORT-A-CATH;  Surgeon: Stark Klein, MD;  Location: [place];  Service: General;  Laterality: N/A;  . SIMPLE MASTECTOMY WITH AXILLARY SENTINEL NODE BIOPSY Left 07/24/2013   Procedure: SIMPLE MASTECTOMY;  Surgeon: Stark Klein, MD;  Location: [place];  Service: General;  Laterality: Left;  . TOTAL MASTECTOMY Right 03/20/2013   Procedure: RIGHT SKIN SPARING TOTAL MASTECTOMY;  Surgeon: Stark Klein, MD;  Location: [place];  Service: General;   Laterality: Right;  . TRANSTHORACIC ECHOCARDIOGRAM  07/03/2012   ef 55-60%/  mild MR and TR/  mild dilated IVC    There were no vitals filed for this visit.     Subjective Assessment - 09/22/18 0949    Subjective  Pt had breast cancer in 2014 and finished chemo in 2015.  Pt reports she is having pain with intercourse that has gotten worse to the point where it always hurts and she doesn't want to be intimate as much.    Patient Stated Goals  have intercourse and tolerate an anuual pelvic exam    Currently in Pain?  No/denies          [facility] PT Assessment - 09/26/18 0001      Assessment   Medical Diagnosis  N94.10 (ICD-10-CM) - Dyspareunia in female    Referring Provider (PT)  Delice Bison, Charlestine Massed, NP    Onset Date/Surgical Date  --   2015   Prior Therapy  No      Precautions   Precautions  None      Restrictions   Weight Bearing Restrictions  No      Balance Screen   Has the patient fallen in the past 6 months  Yes    How many times?  occasionally due to peripheral neuropathy    Has the patient had a decrease in activity level because of a fear of falling?   No    Is the patient reluctant to leave their home because of a fear of falling?   No      Home Environment   Living Environment  Private residence    Living Arrangements  Spouse/significant other;Children   42 y/o son     Prior Function   Level of Independence  Independent    Vocation  Part time employment      Cognition   Overall Cognitive Status  Within Functional Limits for tasks assessed      Posture/Postural Control   Posture/Postural Control  Postural limitations    Postural Limitations  Increased thoracic kyphosis;Increased lumbar lordosis      Strength   Right Hip Flexion  4/5    Right Hip Extension  4+/5    Right Hip External Rotation   4+/5    Right Hip ABduction  4+/5    Right Hip ADduction  4-/5    Left Hip Flexion  4+/5    Left Hip Extension  4+/5    Left Hip External Rotation  5/5     Left Hip ABduction  4+/5    Left Hip ADduction  4-/5      Flexibility   Soft Tissue Assessment /Muscle Length  yes    Hamstrings  mildly tight      Palpation   Palpation comment  tight lumbar paraspinals and glutes                Objective measurements completed on examination: See above findings.    Pelvic Floor Special Questions - 09/26/18 0001  Prior Pelvic/Prostate Exam  Yes    Currently Sexually Active  Yes    Is this Painful  Yes    Marinoff Scale  pain prevents any attempts at intercourse    Urinary Leakage  Yes    How often  2/month    Pad use  no    Activities that cause leaking  With strong urge    Urinary urgency  No    Urinary frequency  normal    Falling out feeling (prolapse)  No    Skin Integrity  Intact    Perineal Body/Introitus   Elevated    Pelvic Floor Internal Exam  pt identity confirmed and informed consent given to perform internal soft tissue assessment    Exam Type  Vaginal    Sensation  normal    Palpation  high tone and tight throughout    Strength  good squeeze, good lift, able to hold agaisnt strong resistance    Strength # of reps  3    Strength # of seconds  3    Tone  high       [facility] Adult PT Treatment/Exercise - 09/26/18 0001      Self-Care   Self-Care  Other Self-Care Comments    Other Self-Care Comments   intial HEP and moisturizers with self massage               PT Short Term Goals - 09/26/18 1656      PT SHORT TERM GOAL #1   Title  Pt will understand how to do moisturizing and self massage to pelvic floor    Time  3    Period  Weeks    Status  New    Target Date  10/13/18        PT Long Term Goals - 09/25/18 0843      PT LONG TERM GOAL #1   Title  ind with advanced HEP    Time  8    Period  Weeks    Status  New    Target Date  11/17/18      PT LONG TERM GOAL #2   Title  1/3 or less on Marinoff scale and satisfied with intimacy with her partner    Time  8    Period  Weeks    Status  New     Target Date  11/17/18      PT LONG TERM GOAL #3   Title  pt will be able to relax and bulge pelvic floor in order to tolerate pelvic exam    Time  8    Period  Weeks    Status  New    Target Date  11/17/18      PT LONG TERM GOAL #4   Title  pt will be 5/5 bilateral LE MMT for improved functional activities without pain    Time  8    Period  Weeks    Status  New    Target Date  11/17/18           Pt presents to skilled PT due to ongoing dyspareunia that is increasing in severity. She has also not been able to make an appointment for a pelvic exam due to pain. Pt demonstrates 4/5 MMT of pelvic floor with high tone . she lacks coordination with having difficulty relaxing after she contracts and is unable to fully bulge the pelvic floor. Pt has hip ROM in normal limites. She has decreased hamstring flexibility.  Pt has mild hip weakness bilaterally. She has posture abdnormalities as mentioned above. Pt will benefit from skilled PT to address impairments and learn how to improve ability to relax and stretch for healthy pelvic floor soft tissue.  Personal Factors and Comorbidities Comorbidity 1  Comorbidities breast cancer  Examination-Activity Limitations Other  Examination-Participation Restrictions Interpersonal Relationship  Pt will benefit from skilled therapeutic intervention in order to improve on the following deficits Pain; Postural dysfunction; Impaired tone; Decreased strength; Decreased coordination  Stability/Clinical Decision Making Evolving/Moderate complexity  Clinical Decision Making Low  Rehab Potential Excellent  PT Frequency 1x / week  PT Duration 8 weeks  PT Treatment/Interventions ADLs/Self Care Home Management; Biofeedback; Cryotherapy; Electrical Stimulation; Moist Heat; Therapeutic activities; Therapeutic exercise; Neuromuscular re-education; Patient/family education; Manual techniques; Taping; Dry needling; Passive range of motion  PT Next Visit Plan  biofeedback, internal STM, educate using dilators  PT Home Exercise Plan -  Recommended Other Services -  Consulted and Agree with Plan of Care Patient          Patient will benefit from skilled therapeutic intervention in order to improve the following deficits and impairments:  Pain, Postural dysfunction, Impaired tone, Decreased strength, Decreased coordination  Visit Diagnosis: Cramp and spasm  Muscle weakness (generalized)     Problem List Patient Active Problem List   Diagnosis Date Noted  . Acquired absence of breast and nipple 01/07/2014  . Personal history of breast cancer 07/11/2013  . Fibroid uterus 07/04/2013  . Chemotherapy-induced peripheral neuropathy ([place]) 02/08/2013  . High blood pressure   . Contact lens/glasses fitting   . Breast cancer ([place])   . Cancer of lower-inner quadrant of RIGHT female breast 06/23/2012  . HSV (herpes simplex virus) anogenital infection 06/06/2012  . Breast mass, right 06/06/2012  . HTN (hypertension) 05/05/2011    Jule Ser, PT 09/26/2018, 5:08 PM  [facility] [address], [place], [place], [address] Phone: [phone]   Fax:  [phone]  Name: Jaime Murillo MRN: [id number] Date of Birth: [birth year]

## 2018-09-22 NOTE — Patient Instructions (Addendum)
Moisturizers . They are used in the vagina to hydrate the mucous membrane that make up the vaginal canal. . Designed to keep a more normal acid balance (ph) . Once placed in the vagina, it will last between two to three days.  . Use 2-3 times per week at bedtime and last longer than 60 min. . Ingredients to avoid is glycerin and fragrance, can increase chance of infection . Should not be used just before sex due to causing irritation . Most are gels administered either in a tampon-shaped applicator or as a vaginal suppository. They are non-hormonal.   Types of Moisturizers . Samul Dada- drug store . Vitamin E vaginal suppositories- Whole foods, Amazon . Moist Again . Coconut oil- can break down condoms . Julva- (Do no use if on Tamoxifen) amazon . Yes moisturizer- amazon . NeuEve Silk , NeuEve Silver for menopausal or over 65 (if have severe vaginal atrophy or cancer treatments use NeuEve Silk for  1 month than move to [employer] Pepsi)- [employer], MapleFlower.dk . Olive and Bee intimate cream- www.oliveandbee.com.au . Mae vaginal moisturizer- Amazon  Creams to use externally on the Vulva area  Albertson's (good for for cancer patients that had radiation to the area)- [place] or [employer].FlyingBasics.com.br  V-magic cream - amazon  Julva-amazon  Vital "V Wild Yam salve ( help moisturize and help with thinning vulvar area, does have Cokato by Damiva labial moisturizer ([place],    Things to avoid in the vaginal area . Do not use things to irritate the vulvar area . No lotions just specialized creams for the vulva area- Neogyn, V-magic, No soaps; can use Aveeno or Calendula cleanser if needed. Must be gentle . No deodorants . No douches . Good to sleep without underwear to let the vaginal area to air out . No scrubbing: spread the lips to let warm water rinse over labias and pat dry  STRETCHING THE PELVIC  FLOOR MUSCLES NO DILATOR  Supplies . Vaginal lubricant . Mirror (optional) . Gloves (optional) Positioning . Start in a semi-reclined position with your head propped up. Bend your knees and place your thumb or finger at the vaginal opening. Procedure . Apply a moderate amount of lubricant on the outer skin of your vagina, the labia minora.  Apply additional lubricant to your finger. Marland Kitchen Spread the skin away from the vaginal opening. Place the end of your finger at the opening. . Do a maximum contraction of the pelvic floor muscles. Tighten the vagina and the anus maximally and relax. . When you know they are relaxed, gently and slowly insert your finger into your vagina, directing your finger slightly downward, for 2-3 inches of insertion. . Relax and stretch the 6 o'clock position . Hold each stretch for _2 min__ and repeat __1_ time with rest breaks of _1__ seconds between each stretch. . Repeat the stretching in the 4 o'clock and 8 o'clock positions. . Total time should be _6__ minutes, _1__ x per day.  Note the amount of theme your were able to achieve and your tolerance to your finger in your vagina. . Once you have accomplished the techniques you may try them in standing with one foot resting on the tub, or in other positions.  This is a good stretch to do in the shower if you don't need to use lubricant.   PROTOCOL FOR DILATORS   1. Wash dilator with soap and water prior to  insertion.    2. Lay on your back reclined. Knees are to be up and apart while on your bed or in the bathtub with warm water.   3. Lubricate the end of the dilator with a water-soluble lubricant.  4. Separate the labia.   5. Tense the pelvic floor muscles than relax; while relaxing, slide lubricated dilator into the vagina.    6. Tense muscles again while holding the dilator so it does not get pushed out; relax and slide it in a little further.   7. Try blowing out as if filling a balloon; this may relax the  muscles and allow penetration.  Repeat blowing out to insert dilator further.  8. Keep dilator in for 10 minutes if tolerate, with the pelvic floor muscles relaxed to further stretch the canal.   9. Never force the dilator into the canal.  10. 1-2 times per day  [facility] [address], [place], [place] [address] Phone # [phone] Fax [phone]

## 2018-09-26 NOTE — Addendum Note (Signed)
Addended by: Su Hoff on: 09/26/2018 05:13 PM   Modules accepted: Orders

## 2018-10-03 DIAGNOSIS — H524 Presbyopia: Secondary | ICD-10-CM | POA: Diagnosis not present

## 2018-10-03 DIAGNOSIS — H5213 Myopia, bilateral: Secondary | ICD-10-CM | POA: Diagnosis not present

## 2018-10-13 ENCOUNTER — Ambulatory Visit: Payer: Medicare Other | Admitting: Physical Therapy

## 2018-10-20 ENCOUNTER — Ambulatory Visit: Payer: Medicare Other | Admitting: Physical Therapy

## 2018-10-27 ENCOUNTER — Ambulatory Visit: Payer: Medicare Other | Attending: Adult Health | Admitting: Physical Therapy

## 2018-11-09 ENCOUNTER — Ambulatory Visit (INDEPENDENT_AMBULATORY_CARE_PROVIDER_SITE_OTHER): Payer: Medicare Other

## 2018-11-09 ENCOUNTER — Ambulatory Visit (INDEPENDENT_AMBULATORY_CARE_PROVIDER_SITE_OTHER): Payer: Medicare Other | Admitting: Podiatry

## 2018-11-09 ENCOUNTER — Other Ambulatory Visit: Payer: Self-pay

## 2018-11-09 ENCOUNTER — Other Ambulatory Visit: Payer: Self-pay | Admitting: Podiatry

## 2018-11-09 DIAGNOSIS — M21961 Unspecified acquired deformity of right lower leg: Secondary | ICD-10-CM | POA: Diagnosis not present

## 2018-11-09 DIAGNOSIS — M2041 Other hammer toe(s) (acquired), right foot: Secondary | ICD-10-CM

## 2018-11-09 DIAGNOSIS — M79671 Pain in right foot: Secondary | ICD-10-CM

## 2018-11-09 DIAGNOSIS — M7751 Other enthesopathy of right foot: Secondary | ICD-10-CM

## 2018-11-09 NOTE — Progress Notes (Signed)
  Subjective:  Patient ID: Jaime Murillo, female    DOB: 07-03-1960,  MRN: [id number]  Chief Complaint  Patient presents with  . Foot Pain    Right plantar forefoot knot/swelling/pain 2 week duration after walking 5 miles.    58 y.o. female presents with the above complaint. Hx as above. Pain underneath the ball of the 2nd metatarsal  Review of Systems: Negative except as noted in the HPI. Denies N/V/F/Ch.  Past Medical History:  Diagnosis Date  . Anemia   . Anxiety   . Depression   . History of cancer chemotherapy 07-17-2012  to  12-04-2012   right breast cancer  . HSV infection   . Hypertension   . Insomnia   . Malignant neoplasm of lower-inner quadrant of right breast of female, estrogen receptor negative [facility]) oncologist-  dr Lindi Adie--  per lov note in epic ,  no recurrence   dx 05/ 2014---  right breast IDC & DCIS, Grade 3, triple negative----  completed neo-adjuvant chemo 12-04-2012,  03-20-2013  right total mastectomy with sln bx (07-24-2013  left simple mastectomy w/ sln bx)  . Neuropathy associated with cancer ([place])    related to chemotherapy  . 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 Nocturia   . PONV (postoperative nausea and vomiting)    shaking after anesthesia; had n/v after mastectomy  . Urinary frequency   . Wears glasses     Current Outpatient Medications:  .  gabapentin (NEURONTIN) 300 MG capsule, Take 2 capsules (600 mg total) by mouth at bedtime., Disp: 180 capsule, Rfl: 2 .  hydrochlorothiazide (HYDRODIURIL) 25 MG tablet, TAKE 1 TABLET BY MOUTH ONCE DAILY, Disp: 30 tablet, Rfl: 5 .  valACYclovir (VALTREX) 500 MG tablet, Take 500 mg by mouth., Disp: , Rfl:   Social History   Tobacco Use  Smoking Status Never Smoker  Smokeless Tobacco Never Used    Allergies  Allergen Reactions  . Lyrica [Pregabalin] Other (See Comments)    Hallucinations and bad dreams   Objective:  There were no vitals filed for this visit. There is no height  or weight on file to calculate BMI. Constitutional Well developed. Well nourished.  Vascular Dorsalis pedis pulses palpable bilaterally. Posterior tibial pulses palpable bilaterally. Capillary refill normal to all digits.  No cyanosis or clubbing noted. Pedal hair growth normal.  Neurologic Normal speech. Oriented to person, place, and time. Epicritic sensation to light touch grossly present bilaterally.  Dermatologic Nails well groomed and normal in appearance. No open wounds. No skin lesions.  Orthopedic: POP Right 2nd MPJ, 2nd digital contracture Right 1st MPJ without pain or crepitus on ROM but slightly reduced ROM   Radiographs: Taken and reviewed s/p 1st metatarsal osteotomy well healed fixation intact, 2nd metatarsal elongated. Assessment:   1. Metatarsal deformity, right   2. Capsulitis of metatarsophalangeal (MTP) joint of right foot   3. Hammer toe of right foot    Plan:  Patient was evaluated and treated and all questions answered.  Capsulitis Right 2nd MPJ, Metatarsal deformity, Hammertoe right 2nd toe -XR as above -Injection delivered as below -Discussed etiology -Offloaded with pad -Should pain persist would consider metatarsal shortening osteotomy and 2nd hammertoe correction  Return in about 3 weeks (around 11/30/2018) for Capsulitis, Right.

## 2018-11-30 ENCOUNTER — Other Ambulatory Visit: Payer: Self-pay

## 2018-11-30 ENCOUNTER — Ambulatory Visit (INDEPENDENT_AMBULATORY_CARE_PROVIDER_SITE_OTHER): Payer: Medicare Other | Admitting: Podiatry

## 2018-11-30 DIAGNOSIS — M21961 Unspecified acquired deformity of right lower leg: Secondary | ICD-10-CM

## 2018-11-30 DIAGNOSIS — M2041 Other hammer toe(s) (acquired), right foot: Secondary | ICD-10-CM | POA: Diagnosis not present

## 2018-11-30 DIAGNOSIS — M7751 Other enthesopathy of right foot: Secondary | ICD-10-CM | POA: Diagnosis not present

## 2018-11-30 NOTE — Progress Notes (Signed)
  Subjective:  Patient ID: Jaime Murillo, female    DOB: Aug 02, 1960,  MRN: [id number]  Chief Complaint  Patient presents with  . Foot Pain    Pt states right plantar forefoot sub 2nd and 3rd still has swelling/pain. Pt states pain is decreased but has not fully resolved.    58 y.o. female presents with the above complaint. Hx as above. States the injection helped but still having fullness at the area under the ball of the second toe, but not as sore.  Review of Systems: Negative except as noted in the HPI. Denies N/V/F/Ch.  Past Medical History:  Diagnosis Date  . Anemia   . Anxiety   . Depression   . History of cancer chemotherapy 07-17-2012  to  12-04-2012   right breast cancer  . HSV infection   . Hypertension   . Insomnia   . Malignant neoplasm of lower-inner quadrant of right breast of female, estrogen receptor negative [facility]) oncologist-  dr Lindi Adie--  per lov note in epic ,  no recurrence   dx 05/ 2014---  right breast IDC & DCIS, Grade 3, triple negative----  completed neo-adjuvant chemo 12-04-2012,  03-20-2013  right total mastectomy with sln bx (07-24-2013  left simple mastectomy w/ sln bx)  . Neuropathy associated with cancer ([place])    related to chemotherapy  . 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 Nocturia   . PONV (postoperative nausea and vomiting)    shaking after anesthesia; had n/v after mastectomy  . Urinary frequency   . Wears glasses     Current Outpatient Medications:  .  gabapentin (NEURONTIN) 300 MG capsule, Take 2 capsules (600 mg total) by mouth at bedtime., Disp: 180 capsule, Rfl: 2 .  hydrochlorothiazide (HYDRODIURIL) 25 MG tablet, TAKE 1 TABLET BY MOUTH ONCE DAILY, Disp: 30 tablet, Rfl: 5 .  valACYclovir (VALTREX) 500 MG tablet, Take 500 mg by mouth., Disp: , Rfl:   Social History   Tobacco Use  Smoking Status Never Smoker  Smokeless Tobacco Never Used    Allergies  Allergen Reactions  . Lyrica [Pregabalin] Other (See  Comments)    Hallucinations and bad dreams   Objective:  There were no vitals filed for this visit. There is no height or weight on file to calculate BMI. Constitutional Well developed. Well nourished.  Vascular Dorsalis pedis pulses palpable bilaterally. Posterior tibial pulses palpable bilaterally. Capillary refill normal to all digits.  No cyanosis or clubbing noted. Pedal hair growth normal.  Neurologic Normal speech. Oriented to person, place, and time. Epicritic sensation to light touch grossly present bilaterally.  Dermatologic Nails well groomed and normal in appearance. No open wounds. No skin lesions.  Orthopedic: POP Right 2nd MPJ, 2nd digital contracture Right 1st MPJ without pain or crepitus on ROM but slightly reduced ROM   Radiographs: none Assessment:   1. Metatarsal deformity, right   2. Capsulitis of metatarsophalangeal (MTP) joint of right foot   3. Hammer toe of right foot    Plan:  Patient was evaluated and treated and all questions answered.  Capsulitis Right 2nd MPJ, Metatarsal deformity, Hammertoe right 2nd toe -Patient wishes to hold off surgical intervention -Metatarsal pads dispensed -Powersteps dispensed. -F/u in 2 months for possible surgical discussion should pain persist, or sooner for injection.  Return in about 2 months (around 01/30/2019) for Capsulitis f/u .

## 2018-12-13 IMAGING — CT CT HEAD W/O CM
3 of 4 series · 17 of 40 positions shown, 19 images · non-contrast
Comparison: 03/23/2013 head CT.

CLINICAL DATA: Left frontal headache for 3 days. History of breast
cancer. No reported injury.

EXAM:
CT HEAD WITHOUT CONTRAST
TECHNIQUE: Contiguous axial images were obtained from the base of the skull
through the vertex without intravenous contrast.

[Series 3: head without · axial · non-contrast · 0.43mm/px · z∈[+1229,+1359]mm · 7 of 36 slices shown, 9 images]
[im 5/36  brain]
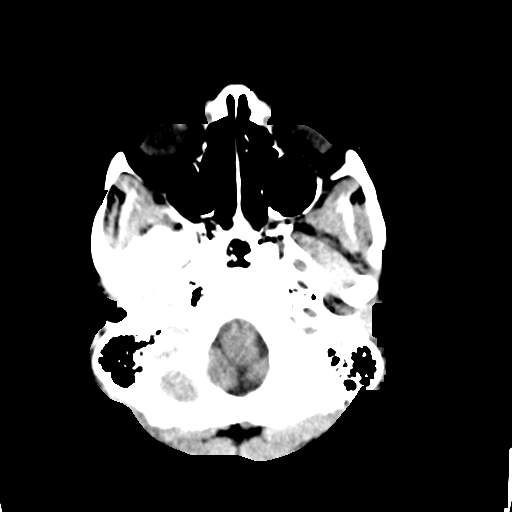
[im 5/36  bone]
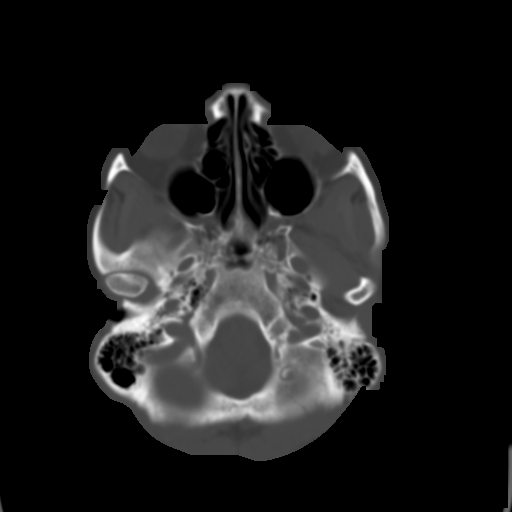
[im 9/36  brain]
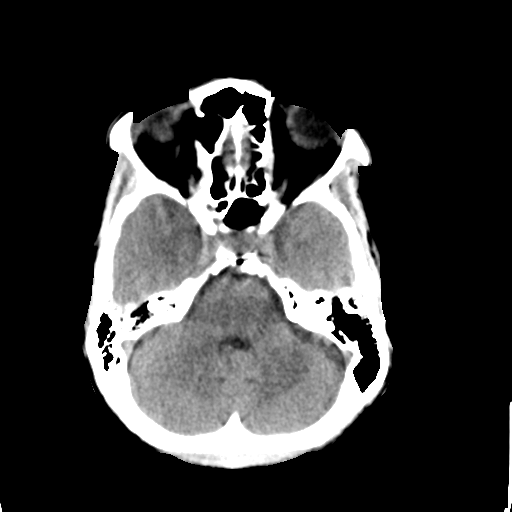
[im 14/36  brain]
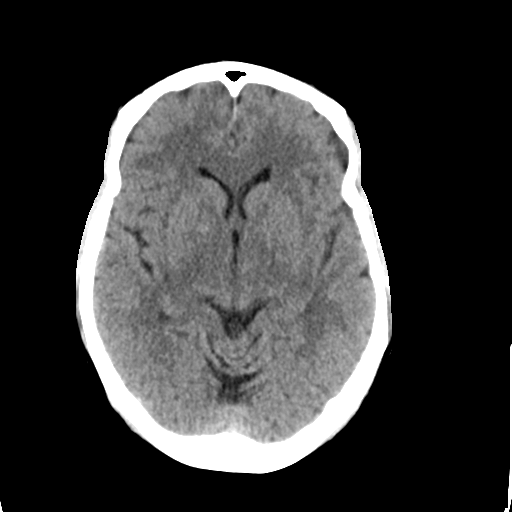
[im 18/36  brain]
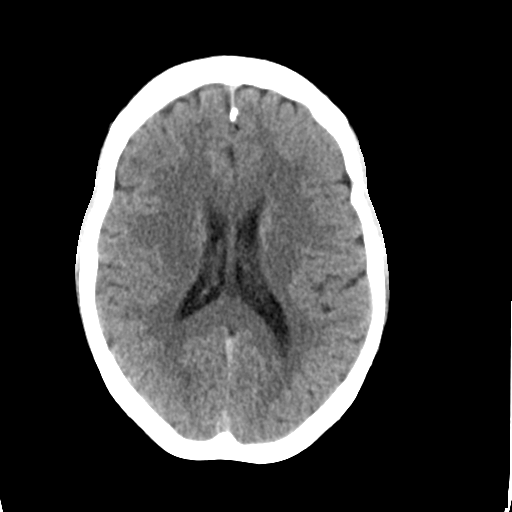
[im 22/36  brain]
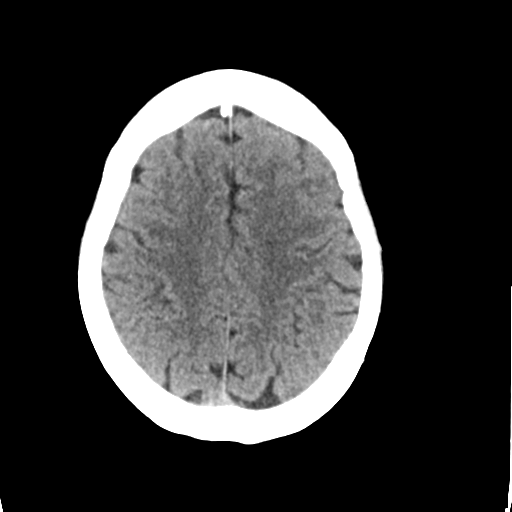
[im 22/36  bone]
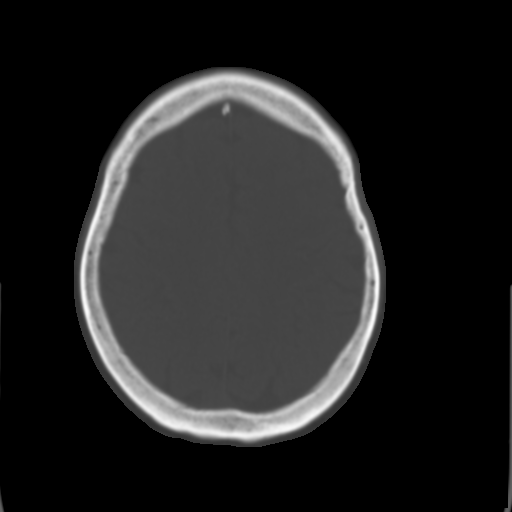
[im 27/36  brain]
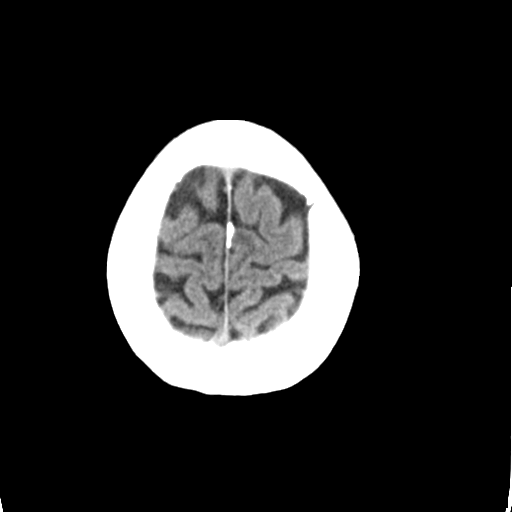
[im 31/36  brain]
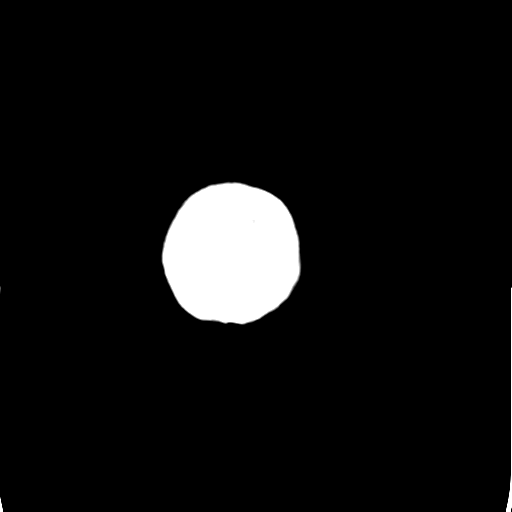

[Series 4: head bone · axial · 0.43mm/px · z∈[+1225,+1347]mm · 7 of 88 slices shown]
[im 9/88  bone]
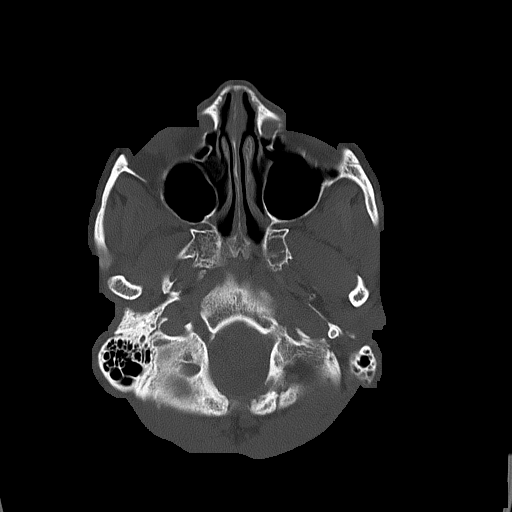
[im 18/88  bone]
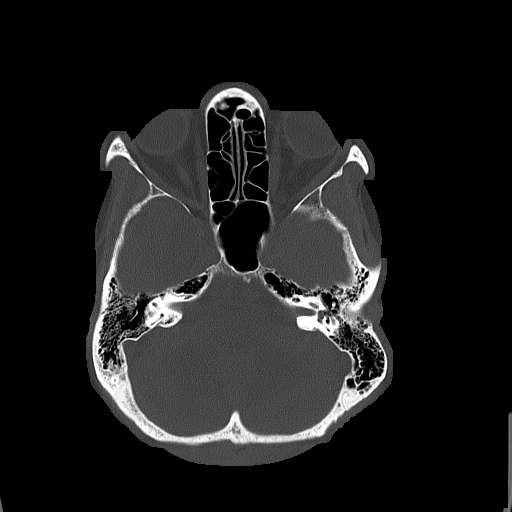
[im 27/88  bone]
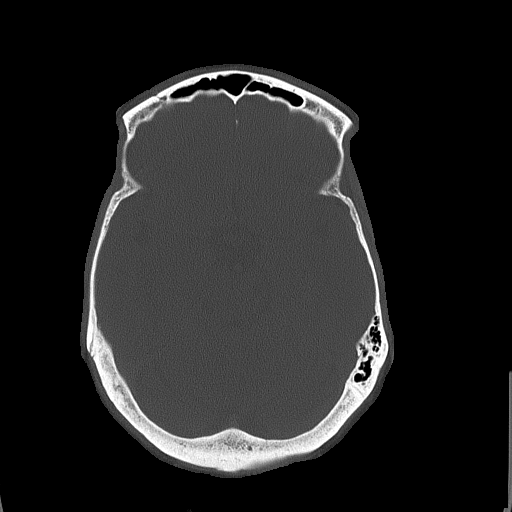
[im 40/88  bone]
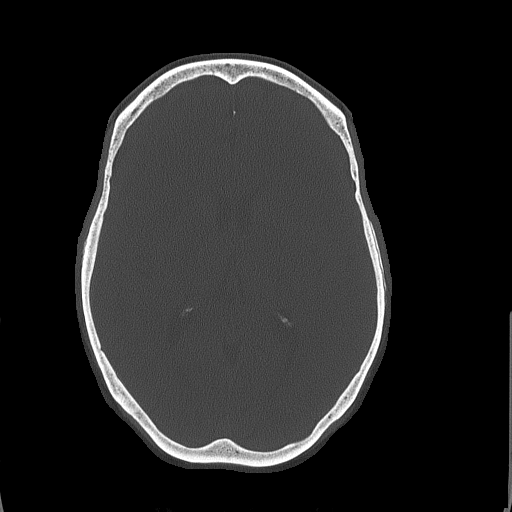
[im 48/88  bone]
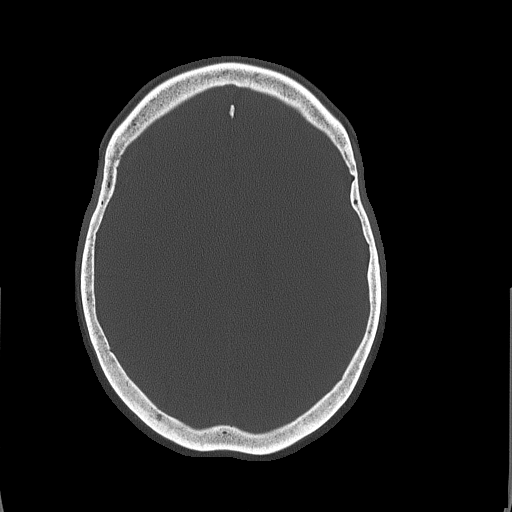
[im 61/88  bone]
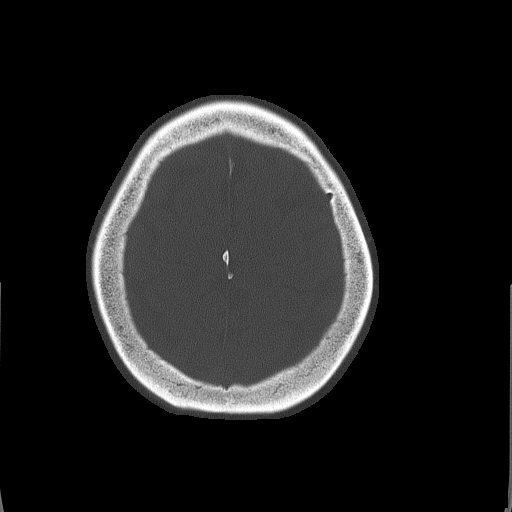
[im 70/88  bone]
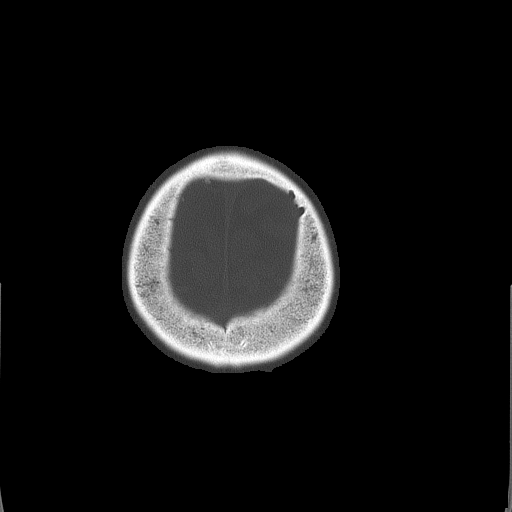

[Series 6: head without cor · coronal · non-contrast · 0.34mm/px · 3 of 67 slices shown]
[im 17/67  brain]
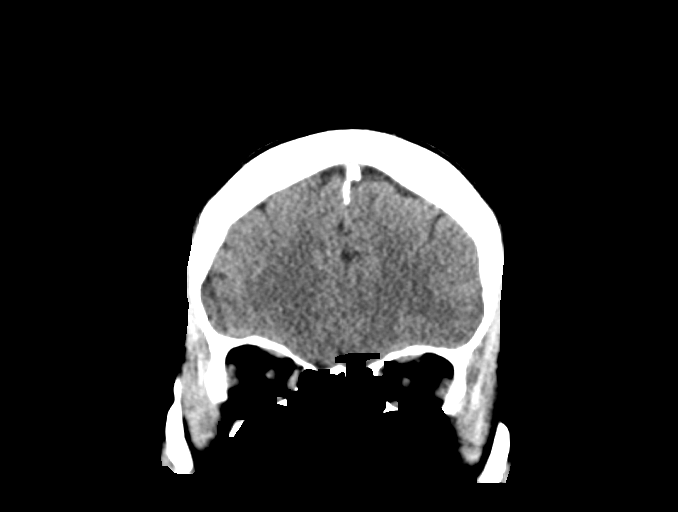
[im 34/67  brain]
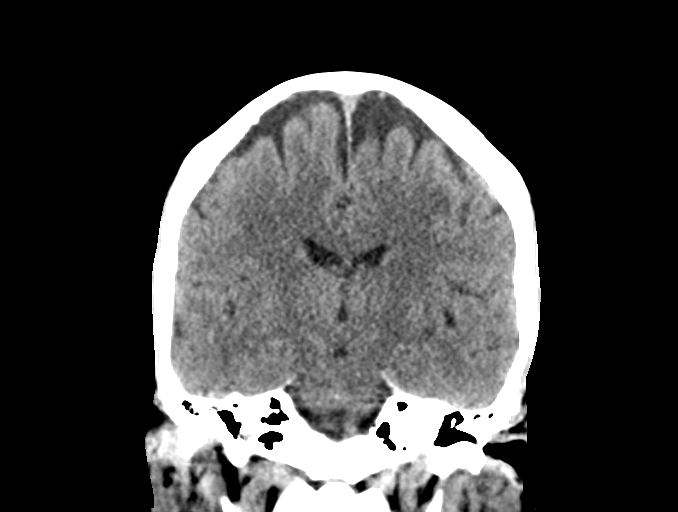
[im 50/67  brain]
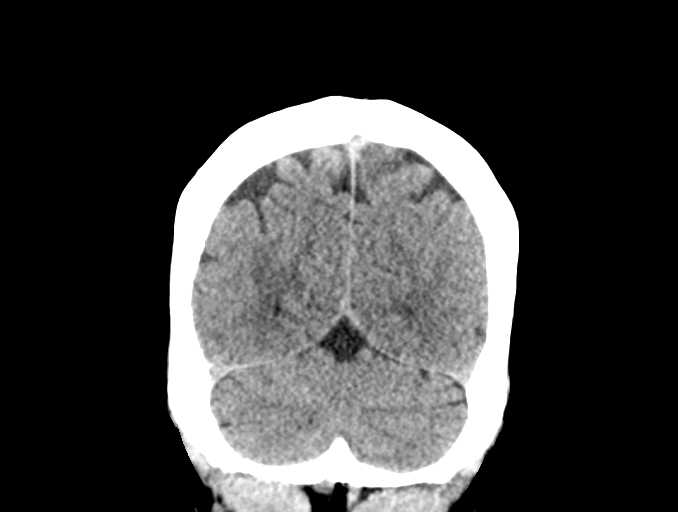

[17 of 40 positions shown; findings below may reference images not displayed]

FINDINGS: Brain: No evidence of parenchymal hemorrhage or extra-axial fluid
collection. No mass lesion, mass effect, or midline shift. No CT
evidence of acute infarction. Cerebral volume is age appropriate. No
ventriculomegaly.

Vascular: No acute abnormality.

Skull: No evidence of calvarial fracture.

Sinuses/Orbits: The visualized paranasal sinuses are essentially
clear.

Other:  The mastoid air cells are unopacified.
IMPRESSION: Negative head CT.  No evidence of acute intracranial abnormality.

## 2019-01-05 ENCOUNTER — Other Ambulatory Visit: Payer: Self-pay

## 2019-01-05 DIAGNOSIS — Z20822 Contact with and (suspected) exposure to covid-19: Secondary | ICD-10-CM

## 2019-01-05 DIAGNOSIS — Z20828 Contact with and (suspected) exposure to other viral communicable diseases: Secondary | ICD-10-CM | POA: Diagnosis not present

## 2019-01-08 LAB — NOVEL CORONAVIRUS, NAA

## 2019-01-11 DIAGNOSIS — G620 Drug-induced polyneuropathy: Secondary | ICD-10-CM | POA: Diagnosis not present

## 2019-01-11 DIAGNOSIS — E785 Hyperlipidemia, unspecified: Secondary | ICD-10-CM | POA: Diagnosis not present

## 2019-01-11 DIAGNOSIS — E559 Vitamin D deficiency, unspecified: Secondary | ICD-10-CM | POA: Diagnosis not present

## 2019-01-11 DIAGNOSIS — R7303 Prediabetes: Secondary | ICD-10-CM | POA: Diagnosis not present

## 2019-01-11 DIAGNOSIS — B009 Herpesviral infection, unspecified: Secondary | ICD-10-CM | POA: Diagnosis not present

## 2019-01-11 DIAGNOSIS — I10 Essential (primary) hypertension: Secondary | ICD-10-CM | POA: Diagnosis not present

## 2019-02-01 ENCOUNTER — Ambulatory Visit (INDEPENDENT_AMBULATORY_CARE_PROVIDER_SITE_OTHER): Payer: Medicare Other | Admitting: Podiatry

## 2019-02-01 ENCOUNTER — Other Ambulatory Visit: Payer: Self-pay

## 2019-02-01 VITALS — BP 141/90 | Wt 130.0 lb

## 2019-02-01 DIAGNOSIS — M2041 Other hammer toe(s) (acquired), right foot: Secondary | ICD-10-CM | POA: Diagnosis not present

## 2019-02-01 DIAGNOSIS — M21961 Unspecified acquired deformity of right lower leg: Secondary | ICD-10-CM | POA: Diagnosis not present

## 2019-02-01 DIAGNOSIS — G620 Drug-induced polyneuropathy: Secondary | ICD-10-CM | POA: Diagnosis not present

## 2019-02-01 DIAGNOSIS — M7751 Other enthesopathy of right foot: Secondary | ICD-10-CM

## 2019-02-01 DIAGNOSIS — I10 Essential (primary) hypertension: Secondary | ICD-10-CM

## 2019-02-01 DIAGNOSIS — T451X5A Adverse effect of antineoplastic and immunosuppressive drugs, initial encounter: Secondary | ICD-10-CM

## 2019-02-01 NOTE — Patient Instructions (Signed)
Pre-Operative Instructions  Congratulations, you have decided to take an important step towards improving your quality of life.  You can be assured that the doctors and staff at [facility] will be with you every step of the way.  Here are some important things you should know:  1. Plan to be at the surgery center/hospital at least 1 (one) hour prior to your scheduled time, unless otherwise directed by the surgical center/hospital staff.  You must have a responsible adult accompany you, remain during the surgery and drive you home.  Make sure you have directions to the surgical center/hospital to ensure you arrive on time. 2. If you are having surgery at [facility] or [facility], you will need a copy of your medical history and physical form from your family physician within one month prior to the date of surgery. We will give you a form for your primary physician to complete.  3. We make every effort to accommodate the date you request for surgery.  However, there are times where surgery dates or times have to be moved.  We will contact you as soon as possible if a change in schedule is required.   4. No aspirin/ibuprofen for one week before surgery.  If you are on aspirin, any non-steroidal anti-inflammatory medications (Mobic, Aleve, Ibuprofen) should not be taken seven (7) days prior to your surgery.  You make take Tylenol for pain prior to surgery.  5. Medications - If you are taking daily heart and blood pressure medications, seizure, reflux, allergy, asthma, anxiety, pain or diabetes medications, make sure you notify the surgery center/hospital before the day of surgery so they can tell you which medications you should take or avoid the day of surgery. 6. No food or drink after midnight the night before surgery unless directed otherwise by surgical center/hospital staff. 7. No alcoholic beverages 24-hours prior to surgery.  No smoking 24-hours prior or 24-hours after  surgery. 8. Wear loose pants or shorts. They should be loose enough to fit over bandages, boots, and casts. 9. Don't wear slip-on shoes. Sneakers are preferred. 10. Bring your boot with you to the surgery center/hospital.  Also bring crutches or a walker if your physician has prescribed it for you.  If you do not have this equipment, it will be provided for you after surgery. 11. If you have not been contacted by the surgery center/hospital by the day before your surgery, call to confirm the date and time of your surgery. 12. Leave-time from work may vary depending on the type of surgery you have.  Appropriate arrangements should be made prior to surgery with your employer. 13. Prescriptions will be provided immediately following surgery by your doctor.  Fill these as soon as possible after surgery and take the medication as directed. Pain medications will not be refilled on weekends and must be approved by the doctor. 14. Remove nail polish on the operative foot and avoid getting pedicures prior to surgery. 15. Wash the night before surgery.  The night before surgery wash the foot and leg well with water and the antibacterial soap provided. Be sure to pay special attention to beneath the toenails and in between the toes.  Wash for at least three (3) minutes. Rinse thoroughly with water and dry well with a towel.  Perform this wash unless told not to do so by your physician.  Enclosed: 1 Ice pack (please put in freezer the night before surgery)   1 Hibiclens skin cleaner     Pre-op instructions  If you have any questions regarding the instructions, please do not hesitate to call our office.  [place]: [address], [place], [place] [address] -- [phone]  [place]: [address] [place], [place] [address] -- [phone]  : [address], , [place] [address] -- [phone]   Website: [email or web address] 

## 2019-02-01 NOTE — Progress Notes (Signed)
  Subjective:  Patient ID: Jaime Murillo, female    DOB: July 26, 1960,  MRN: [id number]  Chief Complaint  Patient presents with  . Foot Pain    pt is here for a f/u on capsulitis of the right foot, pt states that the right foot is still hurting since the last time she was here, pt also states that she is now requesting surgical intervention on her right foot    59 y.o. female presents with the above complaint. History confirmed with patient. Hx above with patient still having significant pain, especially when she is trying to do activity.  Objective:  Physical Exam: warm, good capillary refill, no trophic changes or ulcerative lesions, normal DP and PT pulses, and normal sensory exam.  Right Foot: Right 1st MPJ with good ROM, no pain on ROM. POP right 2nd, 3rd MPJ. Right 2nd hammertoe deformity   No images are attached to the encounter.  Radiographs: X-ray of the right foot: prior XR reviewed, elongated 2nd/3rd metatarsals with hammertoe 2nd toe   Assessment:  1. Metatarsal deformity, right   2. Capsulitis of metatarsophalangeal (MTP) joint of right foot   3. Hammer toe of right foot   4. Chemotherapy-induced peripheral neuropathy ([place])   5. Hypertension, unspecified type     Plan:  Patient was evaluated and treated and all questions answered.  Capsulitis and Metatarsalgia -XR reviewed with patient -Patient has failed all conservative therapy and wishes to proceed with surgical intervention. All risks, benefits, and alternatives discussed with patient. No guarantees given. Consent reviewed and signed by patient. -Planned procedures: right 2nd/3rd metatarsal shortening osteotomies, 2nd hammertoe correction. -Patient is at higher risk for surgery due to the above comorbidities.    Return for post op.   MDM Number of Diagnoses or Management Options Capsulitis of metatarsophalangeal (MTP) joint of right foot: established, worsening Hammer toe of right foot: established,  worsening Metatarsal deformity, right: established, worsening   Amount and/or Complexity of Data Reviewed Tests in the radiology section of CPT: reviewed Independent visualization of images, tracings, or specimens: yes  Risk of Complications, Morbidity, and/or Mortality Presenting problems: moderate Management options: high

## 2019-02-05 ENCOUNTER — Telehealth: Payer: Self-pay | Admitting: Podiatry

## 2019-02-05 NOTE — Telephone Encounter (Signed)
Called pt to let her know she just needs to register online with the surgical center and the information to do that is in the brochure that's in her bag on the first dark blue page. I also informed the pt they would call her from the surgical center either this afternoon or tomorrow to let her know what time to arrive for her surgery on Wednesday. Told her to call with any other questions and/or concerns before surgery.

## 2019-02-05 NOTE — Telephone Encounter (Signed)
I'm scheduled for surgery on Wednesday with Dr. March Rummage. I need some help. I believe I was supposed to complete something online and I'm looking through my e-mail and I don't see it. So could you please give me a call. I don't have a time yet, so please just give me a call as I need help. Thank you.

## 2019-02-07 ENCOUNTER — Encounter: Payer: Self-pay | Admitting: Podiatry

## 2019-02-07 ENCOUNTER — Other Ambulatory Visit: Payer: Self-pay | Admitting: Podiatry

## 2019-02-07 DIAGNOSIS — M7741 Metatarsalgia, right foot: Secondary | ICD-10-CM | POA: Diagnosis not present

## 2019-02-07 DIAGNOSIS — M25571 Pain in right ankle and joints of right foot: Secondary | ICD-10-CM | POA: Diagnosis not present

## 2019-02-07 DIAGNOSIS — M21541 Acquired clubfoot, right foot: Secondary | ICD-10-CM | POA: Diagnosis not present

## 2019-02-07 DIAGNOSIS — I10 Essential (primary) hypertension: Secondary | ICD-10-CM | POA: Diagnosis not present

## 2019-02-07 DIAGNOSIS — M2041 Other hammer toe(s) (acquired), right foot: Secondary | ICD-10-CM | POA: Diagnosis not present

## 2019-02-07 MED ORDER — ONDANSETRON HCL 4 MG PO TABS: 4.0000 mg | ORAL_TABLET | Freq: Three times a day (TID) | ORAL | 0 refills | Status: DC | PRN

## 2019-02-07 MED ORDER — CEPHALEXIN 500 MG PO CAPS: 500.0000 mg | ORAL_CAPSULE | Freq: Two times a day (BID) | ORAL | 0 refills | Status: DC

## 2019-02-07 MED ORDER — OXYCODONE-ACETAMINOPHEN 10-325 MG PO TABS: 1.0000 | ORAL_TABLET | ORAL | 0 refills | Status: DC | PRN

## 2019-02-07 NOTE — Progress Notes (Signed)
Rx sent to pharmacy for outpatient surgery. °

## 2019-02-08 ENCOUNTER — Telehealth: Payer: Self-pay

## 2019-02-08 NOTE — Telephone Encounter (Signed)
POST OP CALL-    1) General condition stated by the patient:   2) Is the pt having pain? Yes  3) Pain score: Strong pain  4) Has the pt taken Rx'd pain medication, regularly or PRN? Yes  5) Is the pain medication giving relief? Yes but pt states would like to take more. Pt was advised a tablet and a half of her pain medication is okay for the 1st day post-op. Pt advised to return to normal schedule of medication after this. Pt states understanding.  6) Any fever, chills, nausea, or vomiting, shortness of breath or tightness in calf? None  7) Is the bandage clean, dry and intact? Yes  8) Is there excessive tightness, bleeding or drainage coming through the bandage? None  9) Did you understand all of the post op instruction sheet given? Yes  10) Any questions or concerns regarding post op care/recovery? None    Confirmed POV appointment with patient

## 2019-02-15 ENCOUNTER — Ambulatory Visit (INDEPENDENT_AMBULATORY_CARE_PROVIDER_SITE_OTHER): Payer: Medicare Other

## 2019-02-15 ENCOUNTER — Other Ambulatory Visit: Payer: Self-pay

## 2019-02-15 ENCOUNTER — Ambulatory Visit (INDEPENDENT_AMBULATORY_CARE_PROVIDER_SITE_OTHER): Payer: Medicare Other | Admitting: Podiatry

## 2019-02-15 DIAGNOSIS — M2041 Other hammer toe(s) (acquired), right foot: Secondary | ICD-10-CM

## 2019-02-15 MED ORDER — OXYCODONE HCL 5 MG PO TABS: 10.0000 mg | ORAL_TABLET | ORAL | 0 refills | Status: DC | PRN

## 2019-02-15 NOTE — Progress Notes (Signed)
  Subjective:  Patient ID: Jaime Murillo, female    DOB: 1960-09-28,  MRN: [id number]  Chief Complaint  Patient presents with  . Routine Post Op    DOS 1.13.2021 METATARSAL OSTEOTOMY 2,3 AND HAMMERTOE REPAIR 2ND RT. Pt states some pain and swelling. Pt states had some vomiting in the first few days post op which has since resolved. Denies fever/chills.     DOS: 02/07/19 Procedure:  Right 2nd hammertoe correction, 2nd/3rd weil ostoetomy  59 y.o. female presents with the above complaint. History confirmed with patient.   Objective:  Physical Exam: tenderness at the surgical site, local edema noted and calf supple, nontender. Incision: healing well, no significant drainage, no dehiscence, no significant erythema  No images are attached to the encounter.  Radiographs: X-ray of the right foot: consistent with post-op state   Assessment:   1. Hammer toe of right foot     Plan:  Patient was evaluated and treated and all questions answered.  Post-operative State -XR reviewed with patient -Dressing applied consisting of sterile gauze, kerlix and ACE bandage -WBAT in CAM boot -Pain medication refilled  -Sutures absorbable will clear to shower next week.  Return in about 1 week (around 02/22/2019) for Post-Op (No XRs).

## 2019-02-16 ENCOUNTER — Other Ambulatory Visit: Payer: Self-pay | Admitting: Podiatry

## 2019-02-16 DIAGNOSIS — M2041 Other hammer toe(s) (acquired), right foot: Secondary | ICD-10-CM

## 2019-02-22 ENCOUNTER — Ambulatory Visit (INDEPENDENT_AMBULATORY_CARE_PROVIDER_SITE_OTHER): Payer: Medicare Other | Admitting: Podiatry

## 2019-02-22 ENCOUNTER — Other Ambulatory Visit: Payer: Self-pay

## 2019-02-22 DIAGNOSIS — Z9889 Other specified postprocedural states: Secondary | ICD-10-CM

## 2019-02-22 DIAGNOSIS — B351 Tinea unguium: Secondary | ICD-10-CM

## 2019-02-22 DIAGNOSIS — M2041 Other hammer toe(s) (acquired), right foot: Secondary | ICD-10-CM

## 2019-02-22 DIAGNOSIS — M21961 Unspecified acquired deformity of right lower leg: Secondary | ICD-10-CM

## 2019-02-22 MED ORDER — FLUCONAZOLE 150 MG PO TABS: 150.0000 mg | ORAL_TABLET | ORAL | 1 refills | Status: DC

## 2019-02-22 NOTE — Progress Notes (Signed)
  Subjective:  Patient ID: Jaime Murillo, female    DOB: [birth year],  MRN: [id number]  Chief Complaint  Patient presents with  . Routine Post Op    POV#2 DOS 02/07/2019 METATARSAL OSTEOTOMY 2,3 AND HAMMER TOE REPAIR 2ND RT, pt states that her pain has been reduced, pt states that she has been showering, and has shown no other signs of infection. pt is concerned with bil toenail fungus   DOS: 02/07/19 Procedure: Weil Osteotomy 2nd/3rd right, 2nd hammetoe   59 y.o. female presents with the above complaint. History confirmed with patient.   Objective:  Physical Exam: no tenderness at the surgical site, local edema noted and calf supple, nontender. Incision: healing well, no significant drainage, no dehiscence, no significant erythema  No images are attached to the encounter.  Assessment:   1. Post-operative state   2. Hammer toe of right foot   3. Metatarsal deformity, right     Plan:  Patient was evaluated and treated and all questions answered.  Post-operative State -Ok to start showering at this time. Advised they cannot soak. -Dressing applied consisting of ACE bandage -WBAT in CAM boot  -XR at next visit, possible surgical shoe next visit.  Onychomycosis -Educated on etiology of nail fungus. -Rx Fluconazole weekly -Nails left foot debrided of excess thickness. -Discussed traumatic component likely to prevent full normal growth.   Return in about 2 weeks (around 03/08/2019) for Post-Op (with XRs).

## 2019-03-05 ENCOUNTER — Ambulatory Visit: Payer: Medicare Other | Attending: Internal Medicine

## 2019-03-05 DIAGNOSIS — Z23 Encounter for immunization: Secondary | ICD-10-CM | POA: Insufficient documentation

## 2019-03-05 NOTE — Progress Notes (Signed)
   Covid-19 Vaccination Clinic  Name:  Jaime Murillo    MRN: [id number] DOB: Jun 01, 1960  03/05/2019  Jaime Murillo was observed post Covid-19 immunization for 15 minutes without incidence. She was provided with Vaccine Information Sheet and instruction to access the V-Safe system.   Jaime Murillo was instructed to call 911 with any severe reactions post vaccine: Marland Kitchen Difficulty breathing  . Swelling of your face and throat  . A fast heartbeat  . A bad rash all over your body  . Dizziness and weakness    Immunizations Administered    Name Date Dose VIS Date Route   Pfizer COVID-19 Vaccine 03/05/2019  6:03 PM 0.3 mL 01/05/2019 Intramuscular   Manufacturer: [place]   Lot: [id number]   [place]: [id number]

## 2019-03-08 ENCOUNTER — Ambulatory Visit (INDEPENDENT_AMBULATORY_CARE_PROVIDER_SITE_OTHER): Payer: Medicare Other

## 2019-03-08 ENCOUNTER — Other Ambulatory Visit: Payer: Self-pay

## 2019-03-08 ENCOUNTER — Ambulatory Visit (INDEPENDENT_AMBULATORY_CARE_PROVIDER_SITE_OTHER): Payer: Medicare Other | Admitting: Podiatry

## 2019-03-08 DIAGNOSIS — M2041 Other hammer toe(s) (acquired), right foot: Secondary | ICD-10-CM

## 2019-03-08 DIAGNOSIS — Z9889 Other specified postprocedural states: Secondary | ICD-10-CM

## 2019-03-22 ENCOUNTER — Encounter: Payer: Medicare Other | Admitting: Podiatry

## 2019-03-30 ENCOUNTER — Ambulatory Visit: Payer: Medicare Other | Attending: Internal Medicine

## 2019-03-30 DIAGNOSIS — Z23 Encounter for immunization: Secondary | ICD-10-CM | POA: Insufficient documentation

## 2019-03-30 NOTE — Progress Notes (Signed)
   Covid-19 Vaccination Clinic  Name:  Jaime Murillo    MRN: [id number] DOB: 1960-05-20  03/30/2019  [name] was observed post Covid-19 immunization for 15 minutes without incident. She was provided with Vaccine Information Sheet and instruction to access the V-Safe system.   [name] was instructed to call 911 with any severe reactions post vaccine: Marland Kitchen Difficulty breathing  . Swelling of face and throat  . A fast heartbeat  . A bad rash all over body  . Dizziness and weakness   Immunizations Administered    Name Date Dose VIS Date Route   Pfizer COVID-19 Vaccine 03/30/2019 10:18 AM 0.3 mL 01/05/2019 Intramuscular   Manufacturer: [place]   Lot: [id number]   [place]: [id number]

## 2019-04-05 ENCOUNTER — Other Ambulatory Visit: Payer: Self-pay

## 2019-04-05 ENCOUNTER — Ambulatory Visit (INDEPENDENT_AMBULATORY_CARE_PROVIDER_SITE_OTHER): Payer: Medicare Other

## 2019-04-05 ENCOUNTER — Ambulatory Visit (INDEPENDENT_AMBULATORY_CARE_PROVIDER_SITE_OTHER): Payer: Medicare Other | Admitting: Podiatry

## 2019-04-05 DIAGNOSIS — M2041 Other hammer toe(s) (acquired), right foot: Secondary | ICD-10-CM | POA: Diagnosis not present

## 2019-04-05 DIAGNOSIS — B351 Tinea unguium: Secondary | ICD-10-CM

## 2019-04-05 DIAGNOSIS — M216X1 Other acquired deformities of right foot: Secondary | ICD-10-CM

## 2019-04-05 DIAGNOSIS — M778 Other enthesopathies, not elsewhere classified: Secondary | ICD-10-CM

## 2019-04-05 DIAGNOSIS — M216X9 Other acquired deformities of unspecified foot: Secondary | ICD-10-CM

## 2019-04-05 MED ORDER — FLUCONAZOLE 150 MG PO TABS: 150.0000 mg | ORAL_TABLET | ORAL | 1 refills | Status: DC

## 2019-04-05 NOTE — Progress Notes (Signed)
  Subjective:  Patient ID: Jaime Murillo, female    DOB: November 30, 1960,  MRN: [id number]  Chief Complaint  Patient presents with  . Routine Post Op    Pt states still having some plantar forefoot pain but her toe feels good.   DOS: 02/07/19 Procedure: Weil Osteotomy 2nd/3rd right, 2nd hammetoe   59 y.o. female presents with the above complaint. History confirmed with patient.   Requests refill of the fluconazole today.  Objective:  Physical Exam: no tenderness at the surgical site, local edema noted and calf supple, nontender. Incision: healing well, no significant drainage, no dehiscence, no significant erythema  No images are attached to the encounter.  Assessment:   1. Hammer toe of right foot   2. Capsulitis of foot, right   3. Equinus deformity of foot   4. Onychomycosis     Plan:  Patient was evaluated and treated and all questions answered.  Post-operative State -Improving, but unable to return to normal shoe d/t swelling -XR reviewed with patient. Surgical sites well healed -Refer to PT for stretching of the Ankle joint, 2nd MPJ, 1st MPJ -Dispense Night splint for reduction of forefoot pressure 2/2 equinus -F/u in 1 month for recheck  Onychomycosis -Refill Fluconazole  Return in about 1 month (around 05/06/2019) for Post-Op (No XRs).

## 2019-04-11 DIAGNOSIS — R2681 Unsteadiness on feet: Secondary | ICD-10-CM | POA: Diagnosis not present

## 2019-04-11 DIAGNOSIS — M25674 Stiffness of right foot, not elsewhere classified: Secondary | ICD-10-CM | POA: Diagnosis not present

## 2019-04-11 DIAGNOSIS — M25474 Effusion, right foot: Secondary | ICD-10-CM | POA: Diagnosis not present

## 2019-04-11 DIAGNOSIS — M62571 Muscle wasting and atrophy, not elsewhere classified, right ankle and foot: Secondary | ICD-10-CM | POA: Diagnosis not present

## 2019-04-11 DIAGNOSIS — M25571 Pain in right ankle and joints of right foot: Secondary | ICD-10-CM | POA: Diagnosis not present

## 2019-04-11 DIAGNOSIS — R262 Difficulty in walking, not elsewhere classified: Secondary | ICD-10-CM | POA: Diagnosis not present

## 2019-04-11 DIAGNOSIS — M25374 Other instability, right foot: Secondary | ICD-10-CM | POA: Diagnosis not present

## 2019-04-13 DIAGNOSIS — M25674 Stiffness of right foot, not elsewhere classified: Secondary | ICD-10-CM | POA: Diagnosis not present

## 2019-04-13 DIAGNOSIS — R262 Difficulty in walking, not elsewhere classified: Secondary | ICD-10-CM | POA: Diagnosis not present

## 2019-04-13 DIAGNOSIS — M25474 Effusion, right foot: Secondary | ICD-10-CM | POA: Diagnosis not present

## 2019-04-13 DIAGNOSIS — M25374 Other instability, right foot: Secondary | ICD-10-CM | POA: Diagnosis not present

## 2019-04-13 DIAGNOSIS — M62571 Muscle wasting and atrophy, not elsewhere classified, right ankle and foot: Secondary | ICD-10-CM | POA: Diagnosis not present

## 2019-04-13 DIAGNOSIS — M25571 Pain in right ankle and joints of right foot: Secondary | ICD-10-CM | POA: Diagnosis not present

## 2019-04-13 DIAGNOSIS — R2681 Unsteadiness on feet: Secondary | ICD-10-CM | POA: Diagnosis not present

## 2019-04-17 DIAGNOSIS — M25374 Other instability, right foot: Secondary | ICD-10-CM | POA: Diagnosis not present

## 2019-04-17 DIAGNOSIS — M25571 Pain in right ankle and joints of right foot: Secondary | ICD-10-CM | POA: Diagnosis not present

## 2019-04-17 DIAGNOSIS — M25674 Stiffness of right foot, not elsewhere classified: Secondary | ICD-10-CM | POA: Diagnosis not present

## 2019-04-17 DIAGNOSIS — R2681 Unsteadiness on feet: Secondary | ICD-10-CM | POA: Diagnosis not present

## 2019-04-17 DIAGNOSIS — M62571 Muscle wasting and atrophy, not elsewhere classified, right ankle and foot: Secondary | ICD-10-CM | POA: Diagnosis not present

## 2019-04-17 DIAGNOSIS — R262 Difficulty in walking, not elsewhere classified: Secondary | ICD-10-CM | POA: Diagnosis not present

## 2019-04-17 DIAGNOSIS — M25474 Effusion, right foot: Secondary | ICD-10-CM | POA: Diagnosis not present

## 2019-04-18 DIAGNOSIS — M25474 Effusion, right foot: Secondary | ICD-10-CM | POA: Diagnosis not present

## 2019-04-18 DIAGNOSIS — M25674 Stiffness of right foot, not elsewhere classified: Secondary | ICD-10-CM | POA: Diagnosis not present

## 2019-04-18 DIAGNOSIS — M25374 Other instability, right foot: Secondary | ICD-10-CM | POA: Diagnosis not present

## 2019-04-18 DIAGNOSIS — R262 Difficulty in walking, not elsewhere classified: Secondary | ICD-10-CM | POA: Diagnosis not present

## 2019-04-18 DIAGNOSIS — R2681 Unsteadiness on feet: Secondary | ICD-10-CM | POA: Diagnosis not present

## 2019-04-18 DIAGNOSIS — M62571 Muscle wasting and atrophy, not elsewhere classified, right ankle and foot: Secondary | ICD-10-CM | POA: Diagnosis not present

## 2019-04-18 DIAGNOSIS — M25571 Pain in right ankle and joints of right foot: Secondary | ICD-10-CM | POA: Diagnosis not present

## 2019-04-24 DIAGNOSIS — R262 Difficulty in walking, not elsewhere classified: Secondary | ICD-10-CM | POA: Diagnosis not present

## 2019-04-24 DIAGNOSIS — M25571 Pain in right ankle and joints of right foot: Secondary | ICD-10-CM | POA: Diagnosis not present

## 2019-04-24 DIAGNOSIS — M25674 Stiffness of right foot, not elsewhere classified: Secondary | ICD-10-CM | POA: Diagnosis not present

## 2019-04-24 DIAGNOSIS — M62571 Muscle wasting and atrophy, not elsewhere classified, right ankle and foot: Secondary | ICD-10-CM | POA: Diagnosis not present

## 2019-04-24 DIAGNOSIS — R2681 Unsteadiness on feet: Secondary | ICD-10-CM | POA: Diagnosis not present

## 2019-04-24 DIAGNOSIS — M25474 Effusion, right foot: Secondary | ICD-10-CM | POA: Diagnosis not present

## 2019-04-24 DIAGNOSIS — M25374 Other instability, right foot: Secondary | ICD-10-CM | POA: Diagnosis not present

## 2019-04-25 DIAGNOSIS — M25674 Stiffness of right foot, not elsewhere classified: Secondary | ICD-10-CM | POA: Diagnosis not present

## 2019-04-25 DIAGNOSIS — R262 Difficulty in walking, not elsewhere classified: Secondary | ICD-10-CM | POA: Diagnosis not present

## 2019-04-25 DIAGNOSIS — M62571 Muscle wasting and atrophy, not elsewhere classified, right ankle and foot: Secondary | ICD-10-CM | POA: Diagnosis not present

## 2019-04-25 DIAGNOSIS — M25374 Other instability, right foot: Secondary | ICD-10-CM | POA: Diagnosis not present

## 2019-04-25 DIAGNOSIS — M25474 Effusion, right foot: Secondary | ICD-10-CM | POA: Diagnosis not present

## 2019-04-25 DIAGNOSIS — R2681 Unsteadiness on feet: Secondary | ICD-10-CM | POA: Diagnosis not present

## 2019-04-25 DIAGNOSIS — M25571 Pain in right ankle and joints of right foot: Secondary | ICD-10-CM | POA: Diagnosis not present

## 2019-04-26 ENCOUNTER — Telehealth: Payer: Self-pay | Admitting: *Deleted

## 2019-04-26 MED ORDER — FLUCONAZOLE 150 MG PO TABS: 150.0000 mg | ORAL_TABLET | ORAL | 1 refills | Status: DC

## 2019-04-26 NOTE — Telephone Encounter (Signed)
I left message informing pt the medication she was requesting should be taken once a week for 6 weeks and she should have enough to get her to the middle of April. I reviewed the phone note again and pt had requested the medication be sent to the [facility] on [address] and I changed the pharmacy and pt requested and called and explained my mistake in the previous message and the medication was now at the [facility]. Pt states understanding.

## 2019-04-26 NOTE — Telephone Encounter (Signed)
Pt states the medication she was taking for the yeast has not been called in to the pharmacy.

## 2019-05-08 DIAGNOSIS — M25571 Pain in right ankle and joints of right foot: Secondary | ICD-10-CM | POA: Diagnosis not present

## 2019-05-08 DIAGNOSIS — M25474 Effusion, right foot: Secondary | ICD-10-CM | POA: Diagnosis not present

## 2019-05-08 DIAGNOSIS — M25674 Stiffness of right foot, not elsewhere classified: Secondary | ICD-10-CM | POA: Diagnosis not present

## 2019-05-08 DIAGNOSIS — R262 Difficulty in walking, not elsewhere classified: Secondary | ICD-10-CM | POA: Diagnosis not present

## 2019-05-08 DIAGNOSIS — M25374 Other instability, right foot: Secondary | ICD-10-CM | POA: Diagnosis not present

## 2019-05-08 DIAGNOSIS — M62571 Muscle wasting and atrophy, not elsewhere classified, right ankle and foot: Secondary | ICD-10-CM | POA: Diagnosis not present

## 2019-05-08 DIAGNOSIS — R2681 Unsteadiness on feet: Secondary | ICD-10-CM | POA: Diagnosis not present

## 2019-05-09 DIAGNOSIS — R2681 Unsteadiness on feet: Secondary | ICD-10-CM | POA: Diagnosis not present

## 2019-05-09 DIAGNOSIS — M25474 Effusion, right foot: Secondary | ICD-10-CM | POA: Diagnosis not present

## 2019-05-09 DIAGNOSIS — M25374 Other instability, right foot: Secondary | ICD-10-CM | POA: Diagnosis not present

## 2019-05-09 DIAGNOSIS — R262 Difficulty in walking, not elsewhere classified: Secondary | ICD-10-CM | POA: Diagnosis not present

## 2019-05-09 DIAGNOSIS — M25571 Pain in right ankle and joints of right foot: Secondary | ICD-10-CM | POA: Diagnosis not present

## 2019-05-09 DIAGNOSIS — M62571 Muscle wasting and atrophy, not elsewhere classified, right ankle and foot: Secondary | ICD-10-CM | POA: Diagnosis not present

## 2019-05-09 DIAGNOSIS — M25674 Stiffness of right foot, not elsewhere classified: Secondary | ICD-10-CM | POA: Diagnosis not present

## 2019-05-10 ENCOUNTER — Ambulatory Visit (INDEPENDENT_AMBULATORY_CARE_PROVIDER_SITE_OTHER): Payer: Medicare Other | Admitting: Podiatry

## 2019-05-10 ENCOUNTER — Other Ambulatory Visit: Payer: Self-pay

## 2019-05-10 DIAGNOSIS — M2041 Other hammer toe(s) (acquired), right foot: Secondary | ICD-10-CM | POA: Diagnosis not present

## 2019-05-10 DIAGNOSIS — M778 Other enthesopathies, not elsewhere classified: Secondary | ICD-10-CM

## 2019-05-10 NOTE — Progress Notes (Signed)
  Subjective:  Patient ID: Jaime Murillo, female    DOB: February 01, 1960,  MRN: [id number]  Chief Complaint  Patient presents with  . Routine Post Op    DOS 02/07/2019 METATARSAL OSTEOTOMY 2,3 AND HAMMERTOE REPAIR 2ND TOE RT. Pt states she is healing well and feeling better.  . Foot Pain    Pt states severe cramping in right lower extremity.   DOS: 02/07/19 Procedure: Weil Osteotomy 2nd/3rd right, 2nd hammetoe   59 y.o. female presents with the above complaint. History confirmed with patient.   Objective:  Physical Exam: no tenderness at the surgical site, local edema noted and calf supple, nontender. Still with some slight decreased ROM at 2nd MPJ but reducible.  Incision: well healed No images are attached to the encounter.  Assessment:   1. Hammer toe of right foot   2. Capsulitis of foot, right     Plan:  Patient was evaluated and treated and all questions answered.  Post-operative State -Improving. Transition to normal shoegear as tolerated -Discussed the importance of continued work on ROM exercises. Her ROM is a little limited at the 2nd MPJ nut reducible with gentle stretching. She should continue this for best long term function.   Return in about 6 weeks (around 06/21/2019).

## 2019-05-11 DIAGNOSIS — R2681 Unsteadiness on feet: Secondary | ICD-10-CM | POA: Diagnosis not present

## 2019-05-11 DIAGNOSIS — M25674 Stiffness of right foot, not elsewhere classified: Secondary | ICD-10-CM | POA: Diagnosis not present

## 2019-05-11 DIAGNOSIS — M62571 Muscle wasting and atrophy, not elsewhere classified, right ankle and foot: Secondary | ICD-10-CM | POA: Diagnosis not present

## 2019-05-11 DIAGNOSIS — R262 Difficulty in walking, not elsewhere classified: Secondary | ICD-10-CM | POA: Diagnosis not present

## 2019-05-11 DIAGNOSIS — M25374 Other instability, right foot: Secondary | ICD-10-CM | POA: Diagnosis not present

## 2019-05-11 DIAGNOSIS — M25571 Pain in right ankle and joints of right foot: Secondary | ICD-10-CM | POA: Diagnosis not present

## 2019-05-11 DIAGNOSIS — M25474 Effusion, right foot: Secondary | ICD-10-CM | POA: Diagnosis not present

## 2019-05-15 DIAGNOSIS — R262 Difficulty in walking, not elsewhere classified: Secondary | ICD-10-CM | POA: Diagnosis not present

## 2019-05-15 DIAGNOSIS — M25674 Stiffness of right foot, not elsewhere classified: Secondary | ICD-10-CM | POA: Diagnosis not present

## 2019-05-15 DIAGNOSIS — R2681 Unsteadiness on feet: Secondary | ICD-10-CM | POA: Diagnosis not present

## 2019-05-15 DIAGNOSIS — M25374 Other instability, right foot: Secondary | ICD-10-CM | POA: Diagnosis not present

## 2019-05-15 DIAGNOSIS — M62571 Muscle wasting and atrophy, not elsewhere classified, right ankle and foot: Secondary | ICD-10-CM | POA: Diagnosis not present

## 2019-05-15 DIAGNOSIS — M25571 Pain in right ankle and joints of right foot: Secondary | ICD-10-CM | POA: Diagnosis not present

## 2019-05-15 DIAGNOSIS — M25474 Effusion, right foot: Secondary | ICD-10-CM | POA: Diagnosis not present

## 2019-05-16 DIAGNOSIS — M62571 Muscle wasting and atrophy, not elsewhere classified, right ankle and foot: Secondary | ICD-10-CM | POA: Diagnosis not present

## 2019-05-16 DIAGNOSIS — R262 Difficulty in walking, not elsewhere classified: Secondary | ICD-10-CM | POA: Diagnosis not present

## 2019-05-16 DIAGNOSIS — M25674 Stiffness of right foot, not elsewhere classified: Secondary | ICD-10-CM | POA: Diagnosis not present

## 2019-05-16 DIAGNOSIS — M25571 Pain in right ankle and joints of right foot: Secondary | ICD-10-CM | POA: Diagnosis not present

## 2019-05-16 DIAGNOSIS — M25374 Other instability, right foot: Secondary | ICD-10-CM | POA: Diagnosis not present

## 2019-05-16 DIAGNOSIS — M25474 Effusion, right foot: Secondary | ICD-10-CM | POA: Diagnosis not present

## 2019-05-16 DIAGNOSIS — R2681 Unsteadiness on feet: Secondary | ICD-10-CM | POA: Diagnosis not present

## 2019-05-17 DIAGNOSIS — R262 Difficulty in walking, not elsewhere classified: Secondary | ICD-10-CM | POA: Diagnosis not present

## 2019-05-17 DIAGNOSIS — R2681 Unsteadiness on feet: Secondary | ICD-10-CM | POA: Diagnosis not present

## 2019-05-17 DIAGNOSIS — M25571 Pain in right ankle and joints of right foot: Secondary | ICD-10-CM | POA: Diagnosis not present

## 2019-05-17 DIAGNOSIS — M25674 Stiffness of right foot, not elsewhere classified: Secondary | ICD-10-CM | POA: Diagnosis not present

## 2019-05-17 DIAGNOSIS — M25374 Other instability, right foot: Secondary | ICD-10-CM | POA: Diagnosis not present

## 2019-05-17 DIAGNOSIS — M25474 Effusion, right foot: Secondary | ICD-10-CM | POA: Diagnosis not present

## 2019-05-17 DIAGNOSIS — M62571 Muscle wasting and atrophy, not elsewhere classified, right ankle and foot: Secondary | ICD-10-CM | POA: Diagnosis not present

## 2019-05-22 DIAGNOSIS — M25674 Stiffness of right foot, not elsewhere classified: Secondary | ICD-10-CM | POA: Diagnosis not present

## 2019-05-22 DIAGNOSIS — R2681 Unsteadiness on feet: Secondary | ICD-10-CM | POA: Diagnosis not present

## 2019-05-22 DIAGNOSIS — M25374 Other instability, right foot: Secondary | ICD-10-CM | POA: Diagnosis not present

## 2019-05-22 DIAGNOSIS — M25571 Pain in right ankle and joints of right foot: Secondary | ICD-10-CM | POA: Diagnosis not present

## 2019-05-22 DIAGNOSIS — M25474 Effusion, right foot: Secondary | ICD-10-CM | POA: Diagnosis not present

## 2019-05-22 DIAGNOSIS — M62571 Muscle wasting and atrophy, not elsewhere classified, right ankle and foot: Secondary | ICD-10-CM | POA: Diagnosis not present

## 2019-05-22 DIAGNOSIS — R262 Difficulty in walking, not elsewhere classified: Secondary | ICD-10-CM | POA: Diagnosis not present

## 2019-05-23 DIAGNOSIS — M25571 Pain in right ankle and joints of right foot: Secondary | ICD-10-CM | POA: Diagnosis not present

## 2019-05-23 DIAGNOSIS — M25674 Stiffness of right foot, not elsewhere classified: Secondary | ICD-10-CM | POA: Diagnosis not present

## 2019-05-23 DIAGNOSIS — M25474 Effusion, right foot: Secondary | ICD-10-CM | POA: Diagnosis not present

## 2019-05-23 DIAGNOSIS — M62571 Muscle wasting and atrophy, not elsewhere classified, right ankle and foot: Secondary | ICD-10-CM | POA: Diagnosis not present

## 2019-05-23 DIAGNOSIS — R262 Difficulty in walking, not elsewhere classified: Secondary | ICD-10-CM | POA: Diagnosis not present

## 2019-05-23 DIAGNOSIS — M25374 Other instability, right foot: Secondary | ICD-10-CM | POA: Diagnosis not present

## 2019-05-23 DIAGNOSIS — R2681 Unsteadiness on feet: Secondary | ICD-10-CM | POA: Diagnosis not present

## 2019-05-30 DIAGNOSIS — M25571 Pain in right ankle and joints of right foot: Secondary | ICD-10-CM | POA: Diagnosis not present

## 2019-05-30 DIAGNOSIS — M25374 Other instability, right foot: Secondary | ICD-10-CM | POA: Diagnosis not present

## 2019-05-30 DIAGNOSIS — R262 Difficulty in walking, not elsewhere classified: Secondary | ICD-10-CM | POA: Diagnosis not present

## 2019-05-30 DIAGNOSIS — R2681 Unsteadiness on feet: Secondary | ICD-10-CM | POA: Diagnosis not present

## 2019-05-30 DIAGNOSIS — M25674 Stiffness of right foot, not elsewhere classified: Secondary | ICD-10-CM | POA: Diagnosis not present

## 2019-05-30 DIAGNOSIS — M25474 Effusion, right foot: Secondary | ICD-10-CM | POA: Diagnosis not present

## 2019-05-30 DIAGNOSIS — M62571 Muscle wasting and atrophy, not elsewhere classified, right ankle and foot: Secondary | ICD-10-CM | POA: Diagnosis not present

## 2019-06-12 DIAGNOSIS — M62571 Muscle wasting and atrophy, not elsewhere classified, right ankle and foot: Secondary | ICD-10-CM | POA: Diagnosis not present

## 2019-06-12 DIAGNOSIS — M25674 Stiffness of right foot, not elsewhere classified: Secondary | ICD-10-CM | POA: Diagnosis not present

## 2019-06-12 DIAGNOSIS — M25374 Other instability, right foot: Secondary | ICD-10-CM | POA: Diagnosis not present

## 2019-06-12 DIAGNOSIS — M25474 Effusion, right foot: Secondary | ICD-10-CM | POA: Diagnosis not present

## 2019-06-12 DIAGNOSIS — M25571 Pain in right ankle and joints of right foot: Secondary | ICD-10-CM | POA: Diagnosis not present

## 2019-06-12 DIAGNOSIS — R262 Difficulty in walking, not elsewhere classified: Secondary | ICD-10-CM | POA: Diagnosis not present

## 2019-06-12 DIAGNOSIS — R2681 Unsteadiness on feet: Secondary | ICD-10-CM | POA: Diagnosis not present

## 2019-06-20 DIAGNOSIS — N941 Unspecified dyspareunia: Secondary | ICD-10-CM | POA: Diagnosis not present

## 2019-06-21 ENCOUNTER — Other Ambulatory Visit: Payer: Self-pay

## 2019-06-21 ENCOUNTER — Ambulatory Visit (INDEPENDENT_AMBULATORY_CARE_PROVIDER_SITE_OTHER): Payer: Medicare Other | Admitting: Podiatry

## 2019-06-21 DIAGNOSIS — M2041 Other hammer toe(s) (acquired), right foot: Secondary | ICD-10-CM

## 2019-06-21 DIAGNOSIS — M216X9 Other acquired deformities of unspecified foot: Secondary | ICD-10-CM

## 2019-06-21 DIAGNOSIS — M778 Other enthesopathies, not elsewhere classified: Secondary | ICD-10-CM | POA: Diagnosis not present

## 2019-06-21 NOTE — Progress Notes (Signed)
  Subjective:  Patient ID: Jaime Murillo, female    DOB: Jul 16, 1960,  MRN: [id number]  Chief Complaint  Patient presents with  . Routine Post Op     f/u METATARSAL OSTEOTOMY 2,3 AND HAMMER TOE REPAIR 2ND RT, pt states that she is doing alot better, pt also states that the right foot has no pain, pt also states that she is looking to get permission to exercise   DOS: 02/07/19 Procedure: Weil Osteotomy 2nd/3rd right, 2nd hammetoe   59 y.o. female presents with the above complaint. History confirmed with patient.   Objective:  Physical Exam: no tenderness at the surgical site, local edema noted and calf supple, nontender.  Second toe rectus Incision: well healed  Assessment:   1. Hammer toe of right foot   2. Capsulitis of foot, right   3. Equinus deformity of foot     Plan:  Patient was evaluated and treated and all questions answered.  Post-operative State -Doing very well no pain to palpation of the second toe good range of motion toe sitting in good position.  She is tolerating normal shoe gear well and wants to get back to activity.  Discussed gradual return to activity.  Follow-up as needed should issues arise.   No follow-ups on file.

## 2019-06-26 DIAGNOSIS — R262 Difficulty in walking, not elsewhere classified: Secondary | ICD-10-CM | POA: Diagnosis not present

## 2019-06-26 DIAGNOSIS — M25674 Stiffness of right foot, not elsewhere classified: Secondary | ICD-10-CM | POA: Diagnosis not present

## 2019-06-26 DIAGNOSIS — M25571 Pain in right ankle and joints of right foot: Secondary | ICD-10-CM | POA: Diagnosis not present

## 2019-06-26 DIAGNOSIS — M25474 Effusion, right foot: Secondary | ICD-10-CM | POA: Diagnosis not present

## 2019-06-26 DIAGNOSIS — R2681 Unsteadiness on feet: Secondary | ICD-10-CM | POA: Diagnosis not present

## 2019-06-26 DIAGNOSIS — M25374 Other instability, right foot: Secondary | ICD-10-CM | POA: Diagnosis not present

## 2019-06-26 DIAGNOSIS — M62571 Muscle wasting and atrophy, not elsewhere classified, right ankle and foot: Secondary | ICD-10-CM | POA: Diagnosis not present

## 2019-06-27 DIAGNOSIS — Z9013 Acquired absence of bilateral breasts and nipples: Secondary | ICD-10-CM | POA: Diagnosis not present

## 2019-06-27 DIAGNOSIS — Z853 Personal history of malignant neoplasm of breast: Secondary | ICD-10-CM | POA: Diagnosis not present

## 2019-06-29 ENCOUNTER — Telehealth: Payer: Self-pay | Admitting: Adult Health

## 2019-06-29 NOTE — Telephone Encounter (Signed)
Rescheduled appts per provider PAL. Pt confirmed new appt date and time.  

## 2019-07-02 ENCOUNTER — Inpatient Hospital Stay: Payer: Medicare Other | Admitting: Adult Health

## 2019-07-13 ENCOUNTER — Telehealth: Payer: Self-pay | Admitting: Adult Health

## 2019-07-13 ENCOUNTER — Telehealth: Payer: Self-pay | Admitting: Physician Assistant

## 2019-07-13 ENCOUNTER — Inpatient Hospital Stay: Payer: Medicare Other | Attending: Adult Health | Admitting: Adult Health

## 2019-07-13 ENCOUNTER — Encounter: Payer: Self-pay | Admitting: Adult Health

## 2019-07-13 ENCOUNTER — Other Ambulatory Visit: Payer: Self-pay

## 2019-07-13 VITALS — BP 126/90 | HR 71 | Temp 97.4°F | Resp 20 | Ht 64.0 in | Wt 127.7 lb

## 2019-07-13 DIAGNOSIS — Z9221 Personal history of antineoplastic chemotherapy: Secondary | ICD-10-CM | POA: Insufficient documentation

## 2019-07-13 DIAGNOSIS — C50311 Malignant neoplasm of lower-inner quadrant of right female breast: Secondary | ICD-10-CM | POA: Diagnosis not present

## 2019-07-13 DIAGNOSIS — N941 Unspecified dyspareunia: Secondary | ICD-10-CM | POA: Diagnosis not present

## 2019-07-13 DIAGNOSIS — Z171 Estrogen receptor negative status [ER-]: Secondary | ICD-10-CM | POA: Insufficient documentation

## 2019-07-13 DIAGNOSIS — Z800 Family history of malignant neoplasm of digestive organs: Secondary | ICD-10-CM | POA: Diagnosis not present

## 2019-07-13 DIAGNOSIS — Z8041 Family history of malignant neoplasm of ovary: Secondary | ICD-10-CM | POA: Insufficient documentation

## 2019-07-13 DIAGNOSIS — I10 Essential (primary) hypertension: Secondary | ICD-10-CM | POA: Diagnosis not present

## 2019-07-13 DIAGNOSIS — Z8042 Family history of malignant neoplasm of prostate: Secondary | ICD-10-CM | POA: Diagnosis not present

## 2019-07-13 DIAGNOSIS — Z9013 Acquired absence of bilateral breasts and nipples: Secondary | ICD-10-CM | POA: Diagnosis not present

## 2019-07-13 DIAGNOSIS — N952 Postmenopausal atrophic vaginitis: Secondary | ICD-10-CM | POA: Insufficient documentation

## 2019-07-13 DIAGNOSIS — Z809 Family history of malignant neoplasm, unspecified: Secondary | ICD-10-CM | POA: Diagnosis not present

## 2019-07-13 DIAGNOSIS — Z79899 Other long term (current) drug therapy: Secondary | ICD-10-CM | POA: Insufficient documentation

## 2019-07-13 DIAGNOSIS — Z8249 Family history of ischemic heart disease and other diseases of the circulatory system: Secondary | ICD-10-CM | POA: Insufficient documentation

## 2019-07-13 NOTE — Progress Notes (Signed)
CLINIC:  Survivorship   REASON FOR VISIT:  Routine follow-up for history of breast cancer.   BRIEF ONCOLOGIC HISTORY:  Oncology History  Cancer of lower-inner quadrant of RIGHT female breast  06/20/2012 Initial Biopsy   Right breast needle core biopsy (3 o'clock): Grade 3, IDC & DCIS.  ER- (0%), PR- (0%), HER2- (ratio 1.26). Ki67 95%.    06/20/2012 Breast US   Right breast with slightly irregular oval hypoechoic mass measuring 7 x 6 x 17 mm at 3 o'clock postion. No mammographic evidence of breast malignancy bilaterally. Palpable area of concern at 12 o'clock, but no suspicious findings at this location on [place].    06/23/2012 Initial Diagnosis   Cancer of lower-inner quadrant of RIGHT female breast   06/26/2012 Breast MRI   Right breast w/clumped NME at 2 o'clock position measuring 0.8 x 1.6 x 2.2 cm located anterior/superior to known cancer. Also 6 mm ill-defined ovoid mass posterior lateral to bx-proven ca, lying superficial pect. muscle. Likely multifocal disease.    07/03/2012 Echocardiogram   Pre-treatment EF: 55-60%   07/03/2012 Imaging   CT c/a/p: No evidence of metastatic disease.    07/05/2012 Procedure   Genetic counseling/testing: OncoGeneDX with 3 VUS identified. 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  Also heteroxygous for 2 likely benign variants on BRCA1 and NBN.  Remainder of genes in GeneDX panel negative.    07/12/2012 Procedure   Right breast needle core biopsy: Benign breast tissue with focal pseudoangiomatous stromal hyperplasic (PASH). No evidence of malignancy.    07/17/2012 - 08/28/2012 Neo-Adjuvant Chemotherapy   Adriamycin & Cytoxan x 4 cycles completed.    09/11/2012 - 10/23/2012 Neo-Adjuvant Chemotherapy   Taxol/Carboplatin weekly x 7 cycles (stopped due to peripheral neuropathy; changed to Gemcitabine/Carbo for additional 3 cycles).   11/06/2012 - 12/04/2012 Neo-Adjuvant Chemotherapy   Gemcitabine/Carboplatin x 3 cycles completed.      11/24/2012 Breast MRI   Previous right breast multifocal malignancy with complete imaging resolution.  Stable 6 mm hemangioma or cyst in liver.    01/12/2013 Surgery   Right axillary SLNB ([place]):  3 SLNs negative.  Port also removed on this date.    03/20/2013 Definitive Surgery   Right skin-sparing total mastectomy Barry Dienes) with immediate reconstruction: No atypia or malignancy identified.  Benign skin & nipple. s/p neoadjuvant therapy.  Represents a complete response. Placement of tissue expanders.    03/20/2013 Pathologic Stage   ypT0; No evidence of cancer.    07/24/2013 Surgery   LEFT simple mastectomy with immediate reconstruction; showed ALH and fibroadenoma. Placement of tissue expanders.    01/07/2014 Surgery   Bilateral breast reconstruction ([place]): Removal of bilateral tissue expanders and placement of bilateral silicone breast implants.    06/03/2014 Survivorship   Survivorship Care Plan given to patient and reveiwed with her during in-person visit.       INTERVAL HISTORY:  Jaime Murillo presents to the Survivorship Clinic today for routine follow-up for her history of breast cancer.  Overall, she reports feeling quite well.  She is up to date with visits with her PCP and with Dr. Iran Planas.  She is doing well and has no health concerns today other than continued vaginal dryness and dyspareunia.  She was referred by her PCP to gynecology and is getting paperwork completed so that she can set up her appointment with them.     REVIEW OF SYSTEMS:  Review of Systems  Constitutional: Negative for appetite change, chills, fatigue, fever and unexpected weight change.  HENT:   Negative for hearing loss, lump/mass and trouble swallowing.   Eyes: Negative for eye problems and icterus.  Respiratory: Negative for chest tightness, cough and shortness of breath.   Cardiovascular: Negative for chest pain, leg swelling and palpitations.  Gastrointestinal: Negative for abdominal  distention, abdominal pain, constipation, diarrhea, nausea and vomiting.  Endocrine: Negative for hot flashes.  Genitourinary: Positive for dyspareunia.   Skin: Negative for itching and rash.  Neurological: Negative for dizziness, extremity weakness, headaches and numbness.  Hematological: Negative for adenopathy. Does not bruise/bleed easily.  Psychiatric/Behavioral: Negative for depression. The patient is not nervous/anxious.   Breast: Denies any new nodularity, masses, tenderness, nipple changes, or nipple discharge.       PAST MEDICAL/SURGICAL HISTORY:  Past Medical History:  Diagnosis Date  . Anemia   . Anxiety   . Depression   . History of cancer chemotherapy 07-17-2012  to  12-04-2012   right breast cancer  . HSV infection   . Hypertension   . Insomnia   . Malignant neoplasm of lower-inner quadrant of right breast of female, estrogen receptor negative [facility]) oncologist-  dr Lindi Adie--  per lov note in epic ,  no recurrence   dx 05/ 2014---  right breast IDC & DCIS, Grade 3, triple negative----  completed neo-adjuvant chemo 12-04-2012,  03-20-2013  right total mastectomy with sln bx (07-24-2013  left simple mastectomy w/ sln bx)  . Neuropathy associated with cancer ([place])    related to chemotherapy  . 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 Nocturia   . PONV (postoperative nausea and vomiting)    shaking after anesthesia; had n/v after mastectomy  . Urinary frequency   . Wears glasses    Past Surgical History:  Procedure Laterality Date  . Barbie Banner OSTEOTOMY Right 01/27/2018   Procedure: DOUBLE   [name];  Surgeon: Evelina Bucy, DPM;  Location: [place];  Service: Podiatry;  Laterality: Right;  POPLITEAL/SAPH BLOCK  . BREAST LUMPECTOMY Right 01/12/2013   Procedure: RIGHT SENTINEL LYMPH  NODE BIOPSY;  Surgeon: Stark Klein, MD;  Location: [place];  Service: General;  Laterality: Right;  RIGHT SENTINEL LYMPH NODE  BIOPSY  . BREAST RECONSTRUCTION WITH PLACEMENT OF TISSUE EXPANDER AND FLEX HD (ACELLULAR HYDRATED DERMIS) Right 03/20/2013   Procedure: IMMEDIATE RIGHT BREAST RECONSTRUCTION WITH PLACEMENT OF TISSUE EXPANDER AND FLEX HD (ACELLULAR HYDRATED DERMIS);  Surgeon: Irene Limbo, MD;  Location: [place];  Service: Plastics;  Laterality: Right;  . BREAST RECONSTRUCTION WITH PLACEMENT OF TISSUE EXPANDER AND FLEX HD (ACELLULAR HYDRATED DERMIS) Left 07/24/2013   Procedure: BREAST RECONSTRUCTION WITH PLACEMENT OF TISSUE EXPANDER AND FLEX HD (ACELLULAR HYDRATED DERMIS) TO THE LEFT BREAST;  Surgeon: Irene Limbo, MD;  Location: [place];  Service: Plastics;  Laterality: Left;  . BREAST RECONSTRUCTION WITH PLACEMENT OF TISSUE EXPANDER AND FLEX HD (ACELLULAR HYDRATED DERMIS) Bilateral 01/07/2014   Procedure: BREAST RECONSTRUCTION WITH REMOVAL  OF TISSUE EXPANDER AND PLACEMENT  OF SILICONE IMPLANTS TO BILATERAL BREAST;  Surgeon: Irene Limbo, MD;  Location: [place];  Service: Plastics;  Laterality: Bilateral;  . COLONOSCOPY    . LAPAROSCOPIC TUBAL LIGATION Bilateral 08-10-2001    dr Ruthann Cancer  '@[facility]'$    with cauterization  . PORT-A-CATH REMOVAL Left 01/12/2013   Procedure: REMOVAL PORT-A-CATH;  Surgeon: Stark Klein, MD;  Location: [place];  Service: General;  Laterality: Left;  . PORTACATH PLACEMENT N/A 07/06/2012   Procedure: INSERTION  PORT-A-CATH;  Surgeon: Stark Klein, MD;  Location: [place];  Service: General;  Laterality: N/A;  . SIMPLE MASTECTOMY WITH AXILLARY SENTINEL NODE BIOPSY Left 07/24/2013   Procedure: SIMPLE MASTECTOMY;  Surgeon: Stark Klein, MD;  Location: [place];  Service: General;  Laterality: Left;  . TOTAL MASTECTOMY Right 03/20/2013   Procedure: RIGHT SKIN SPARING TOTAL MASTECTOMY;  Surgeon: Stark Klein, MD;  Location: [place];  Service: General;  Laterality: Right;  . TRANSTHORACIC ECHOCARDIOGRAM  07/03/2012   ef 55-60%/  mild MR and TR/  mild  dilated IVC     ALLERGIES:  Allergies  Allergen Reactions  . Lyrica [Pregabalin] Other (See Comments)    Hallucinations and bad dreams     CURRENT MEDICATIONS:  Outpatient Encounter Medications as of 07/13/2019  Medication Sig  . cephALEXin (KEFLEX) 500 MG capsule Take 1 capsule (500 mg total) by mouth 2 (two) times daily.  . fluconazole (DIFLUCAN) 150 MG tablet Take 1 tablet (150 mg total) by mouth once a week.  . gabapentin (NEURONTIN) 300 MG capsule Take 2 capsules (600 mg total) by mouth at bedtime.  . hydrochlorothiazide (HYDRODIURIL) 25 MG tablet TAKE 1 TABLET BY MOUTH ONCE DAILY  . ibuprofen (ADVIL) 800 MG tablet Take 800 mg by mouth 3 (three) times daily.  . ondansetron (ZOFRAN) 4 MG tablet Take 1 tablet (4 mg total) by mouth every 8 (eight) hours as needed for nausea or vomiting.  Marland Kitchen oxyCODONE (ROXICODONE) 5 MG immediate release tablet Take 2 tablets (10 mg total) by mouth every 4 (four) hours as needed for severe pain.  Marland Kitchen penicillin v potassium (VEETID) 500 MG tablet Take 500 mg by mouth 4 (four) times daily.  . valACYclovir (VALTREX) 500 MG tablet Take 500 mg by mouth.  . [DISCONTINUED] prochlorperazine (COMPAZINE) 10 MG tablet Take 1 tablet (10 mg total) by mouth every 6 (six) hours as needed (Nausea or vomiting).   No facility-administered encounter medications on file as of 07/13/2019.     ONCOLOGIC FAMILY HISTORY:  Family History  Problem Relation Age of Onset  . Heart disease Mother   . Aneurysm Mother   . Hypertension Father   . Prostate cancer Father 73  . Hypertension Sister   . Ovarian cancer Paternal Grandmother        died in her 28s  . Stomach cancer Maternal Grandmother 68  . Cancer Cousin        paternal cousin with an unknown form of cancer    GENETIC COUNSELING/TESTING: See above  SOCIAL HISTORY:  Social History   Socioeconomic History  . Marital status: Single    Spouse name: Not on file  . Number of children: 2  . Years of education: 42   . Highest education level: Not on file  Occupational History    Comment: Disabled  Tobacco Use  . Smoking status: Never Smoker  . Smokeless tobacco: Never Used  Vaping Use  . Vaping Use: Never used  Substance and Sexual Activity  . Alcohol use: No    Alcohol/week: 0.0 standard drinks  . Drug use: No  . Sexual activity: Yes    Birth control/protection: Surgical    Comment: BTL  Other Topics Concern  . Not on file  Social History Narrative   Patient lives at home with her son she is single.   Disabled.   Education- One year of college.   Right handed.   Caffeine- One cup daily.   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 To graduate May 2020   Social Determinants of Health   Financial Resource Strain:   . Difficulty of Paying Living Expenses:   Food Insecurity:   . Worried About Programme researcher, broadcasting/film/video in the Last Year:   . Barista in the Last Year:   Transportation Needs:   . Freight forwarder (Medical):   Marland Kitchen Lack of Transportation (Non-Medical):   Physical Activity:   . Days of Exercise per Week:   . Minutes of Exercise per Session:   Stress:   . Feeling of Stress :   Social Connections:   . Frequency of Communication with Friends and Family:   . Frequency of Social Gatherings with Friends and Family:   . Attends Religious Services:   . Active Member of Clubs or Organizations:   . Attends Banker Meetings:   Marland Kitchen Marital Status:   Intimate Partner Violence:   . Fear of Current or Ex-Partner:   . Emotionally Abused:   Marland Kitchen Physically Abused:   . Sexually Abused:        PHYSICAL EXAMINATION:  Vital Signs: Vitals:   07/13/19 1125  BP: 126/90  Pulse: 71  Resp: 20  Temp: (!) 97.4 F (36.3 C)  SpO2: 100%   Filed Weights   07/13/19 1125  Weight: 127 lb 11.2 oz (57.9 kg)   General: Well-nourished, well-appearing female in no acute distress.  Unaccompanied today.   HEENT: Head is normocephalic.  Pupils equal and  reactive to light. Conjunctivae clear without exudate.  Sclerae anicteric. Oral mucosa is pink, moist.  Oropharynx is pink without lesions or erythema.  Lymph: No cervical, supraclavicular, or infraclavicular lymphadenopathy noted on palpation.  Cardiovascular: Regular rate and rhythm.Marland Kitchen Respiratory: Clear to auscultation bilaterally. Chest expansion symmetric; breathing non-labored.  Breast Exam:  -Breasts: bilateral breasts s/p mastectomy and reconstruction, no nodules or swelling noted -Axilla: No axillary adenopathy bilaterally.  GI: Abdomen soft and round; non-tender, non-distended. Bowel sounds normoactive. No hepatosplenomegaly.   GU: Deferred.  Neuro: No focal deficits. Steady gait.  Psych: Mood and affect normal and appropriate for situation.  MSK: No focal spinal tenderness to palpation, full range of motion in bilateral upper extremities Extremities: No edema. Skin: Warm and dry.  LABORATORY DATA:  None for this visit   DIAGNOSTIC IMAGING:  Most recent mammogram:   N/a s/p bilateral mastecomies    ASSESSMENT AND PLAN:  Ms.. Murillo is a pleasant 59 y.o. female with history of Stage IA right breast invasive ductal carcinoma, ER-/PR-/HER2-, diagnosed in 06/2012, treated with neoadjuvant chemotherapy and bilateral mastectomies.  She presents to the Survivorship Clinic for surveillance and routine follow-up.   1. History of breast cancer:  Jaime Murillo is currently clinically and radiographically without evidence of disease or recurrence of breast cancer. She will return to the cancer center in one year for routine surveillance and monitoring.  She will also continue to see Dr. Leta Baptist annually.  I encouraged her to call me with any questions or concerns before her next visit at the cancer center, and I would be happy to see her sooner, if needed.    2. Dyspareunia and vaginal atrophy:  I recommended she get vitamin e suppositories to use from [place] [facility].  I also referred her to  pelvic rehab.  She will f/u with gynecology as well.    3. Cancer screening:  Due to Jaime Murillo's history and her age, she should receive screening for skin cancers, colon cancer, and  gynecologic cancers. She was encouraged to follow-up with her PCP for appropriate cancer screenings.   4. Health maintenance and wellness promotion: Jaime Murillo was encouraged to consume 5-7 servings of fruits and vegetables per day. She was also encouraged to engage in moderate to vigorous exercise for 30 minutes per day most days of the week. She was instructed to limit her alcohol consumption and continue to abstain from tobacco use.    Dispo:  -Return to cancer center in one year for LTS follow up -Refer to PT -F/u with Dr. Iran Planas   Total encounter time: 20 minutes*   Gardenia Phlegm, NP [place] [phone]  *Total Encounter Time as defined by the Centers for Medicare and Medicaid Services includes, in addition to the face-to-face time of a patient visit (documented in the note above) non-face-to-face time: obtaining and reviewing outside history, ordering and reviewing medications, tests or procedures, care coordination (communications with other health care professionals or caregivers) and documentation in the medical record.   Note: PRIMARY CARE PROVIDER Trey Sailors, [place] [phone]

## 2019-07-13 NOTE — Telephone Encounter (Signed)
   Jaime Murillo DOB: [birth year] MRN: [id number]   RIDER WAIVER AND RELEASE OF LIABILITY  For purposes of improving physical access to our facilities, [facility] is pleased to partner with third parties to provide [facility] patients or other authorized individuals the option of convenient, on-demand ground transportation services (the [place]") through use of the technology service that enables users to request on-demand ground transportation from independent third-party providers.  By opting to use and accept these [employer], I, the undersigned, hereby agree on behalf of myself, and on behalf of any minor child using the [employer] for whom I am the parent or legal guardian, as follows:  1. Government social research officer provided to me are provided by independent third-party transportation providers who are not [employer] or employees and who are unaffiliated with [employer]. 2. [facility] is neither a transportation carrier nor a common or public carrier. 3. [facility] has no control over the quality or safety of the transportation that occurs as a result of the [employer]. 4. [facility] cannot guarantee that any third-party transportation provider will complete any arranged transportation service. 5. [facility] makes no representation, warranty, or guarantee regarding the reliability, timeliness, quality, safety, suitability, or availability of any of the Transport Services or that they will be error free. 6. I fully understand that traveling by vehicle involves risks and dangers of serious bodily injury, including permanent disability, paralysis, and death. I agree, on behalf of myself and on behalf of any minor child using the Transport Services for whom I am the parent or legal guardian, that the entire risk arising out of my use of the [employer] remains solely with me, to the maximum extent permitted under applicable law. 7. The [employer] are provided "as is" and "as available." [facility] disclaims all representations and warranties, express, implied or statutory, not expressly set out in these terms, including the implied warranties of merchantability and fitness for a particular purpose. 8. I hereby waive and release [facility], its agents, employees, officers, directors, representatives, insurers, attorneys, assigns, successors, subsidiaries, and affiliates from any and all past, present, or future claims, demands, liabilities, actions, causes of action, or suits of any kind directly or indirectly arising from acceptance and use of the [employer]. 9. I further waive and release [facility] and its affiliates from all present and future liability and responsibility for any injury or death to persons or damages to property caused by or related to the use of the [employer]. 10. I have read this Waiver and Release of Liability, and I understand the terms used in it and their legal significance. This Waiver is freely and voluntarily given with the understanding that my right (as well as the right of any minor child for whom I am the parent or legal guardian using the [employer]) to legal recourse against [place] in connection with the [employer] is knowingly surrendered in return for use of these services.   I attest that I read the consent document to Jaime Murillo, gave Ms. Broski the opportunity to ask questions and answered the questions asked (if any). I affirm that Jaime Murillo then provided consent for she's participation in this program.     Cameron Proud

## 2019-07-13 NOTE — Telephone Encounter (Signed)
Scheduled appts per 6/18 los. Pt confirmed appt date and time.

## 2019-07-29 NOTE — Progress Notes (Signed)
  Subjective:  Patient ID: Jaime Murillo, female    DOB: 26-Feb-1960,  MRN: [id number]  Chief Complaint  Patient presents with  . Routine Post Op    POV#3 DOS 02/07/2019 METATARSAL OSTEOTOMY 2,3 AND HAMMER TOE REPAIR 2ND RT, pt shows no signs of infection, pt also states that she no longer has any pain in her foot at all, pt has also stopped taking pain meds, and has no other comments or concerns   DOS: 02/07/19 Procedure: [place]il Osteotomy 2nd/3rd right, 2nd hammetoe   59 y.o. female presents with the above complaint. History confirmed with patient.   Objective:  Physical Exam: no tenderness at the surgical site, local edema noted and calf supple, nontender. No pain to palpation about the second MPJ toe slightly dorsiflexed at the MPJ Incision: Well-healed No images are attached to the encounter.  Assessment:   1. Hammer toe of right foot   2. Post-operative state     Plan:  Patient was evaluated and treated and all questions answered.  Post-operative State -X-rays taken reviewed healing well. Transition to surgical shoe. Shoe dispensed. ABN signed by patient. Copy given to patient. Work on range of motion of the second MPJ with plantarflexion stretches  Return in about 2 weeks (around 03/22/2019) for Post-Op (with XRs).

## 2019-09-04 ENCOUNTER — Ambulatory Visit: Payer: Medicare Other | Attending: Adult Health | Admitting: Physical Therapy

## 2019-09-12 ENCOUNTER — Ambulatory Visit: Payer: Medicare Other | Admitting: Physical Therapy

## 2019-09-15 DIAGNOSIS — Z03818 Encounter for observation for suspected exposure to other biological agents ruled out: Secondary | ICD-10-CM | POA: Diagnosis not present

## 2019-09-19 ENCOUNTER — Encounter: Payer: Medicare Other | Admitting: Physical Therapy

## 2019-09-28 ENCOUNTER — Ambulatory Visit: Payer: Medicare Other | Admitting: Physical Therapy

## 2019-11-01 DIAGNOSIS — Z0001 Encounter for general adult medical examination with abnormal findings: Secondary | ICD-10-CM | POA: Diagnosis not present

## 2019-11-01 DIAGNOSIS — R7303 Prediabetes: Secondary | ICD-10-CM | POA: Diagnosis not present

## 2019-11-01 DIAGNOSIS — E785 Hyperlipidemia, unspecified: Secondary | ICD-10-CM | POA: Diagnosis not present

## 2019-11-01 DIAGNOSIS — G620 Drug-induced polyneuropathy: Secondary | ICD-10-CM | POA: Diagnosis not present

## 2019-11-01 DIAGNOSIS — Z1389 Encounter for screening for other disorder: Secondary | ICD-10-CM | POA: Diagnosis not present

## 2019-11-01 DIAGNOSIS — E559 Vitamin D deficiency, unspecified: Secondary | ICD-10-CM | POA: Diagnosis not present

## 2019-11-01 DIAGNOSIS — B009 Herpesviral infection, unspecified: Secondary | ICD-10-CM | POA: Diagnosis not present

## 2019-11-01 DIAGNOSIS — I10 Essential (primary) hypertension: Secondary | ICD-10-CM | POA: Diagnosis not present

## 2019-11-22 DIAGNOSIS — G620 Drug-induced polyneuropathy: Secondary | ICD-10-CM | POA: Diagnosis not present

## 2019-11-22 DIAGNOSIS — I10 Essential (primary) hypertension: Secondary | ICD-10-CM | POA: Diagnosis not present

## 2019-11-22 DIAGNOSIS — Z79899 Other long term (current) drug therapy: Secondary | ICD-10-CM | POA: Diagnosis not present

## 2019-11-22 DIAGNOSIS — T451X5A Adverse effect of antineoplastic and immunosuppressive drugs, initial encounter: Secondary | ICD-10-CM | POA: Diagnosis not present

## 2019-11-22 DIAGNOSIS — Z1159 Encounter for screening for other viral diseases: Secondary | ICD-10-CM | POA: Diagnosis not present

## 2019-12-02 ENCOUNTER — Other Ambulatory Visit: Payer: Self-pay

## 2019-12-02 ENCOUNTER — Encounter (HOSPITAL_COMMUNITY): Payer: Self-pay | Admitting: *Deleted

## 2019-12-02 ENCOUNTER — Ambulatory Visit (HOSPITAL_COMMUNITY)
Admission: EM | Admit: 2019-12-02 | Discharge: 2019-12-02 | Disposition: A | Discharge status: Home / Self Care | Payer: Medicare Other | Attending: Family Medicine | Admitting: Family Medicine

## 2019-12-02 DIAGNOSIS — J3489 Other specified disorders of nose and nasal sinuses: Secondary | ICD-10-CM

## 2019-12-02 MED ORDER — FLUTICASONE PROPIONATE 50 MCG/ACT NA SUSP: 1.0000 | Freq: Two times a day (BID) | NASAL | 2 refills | Status: DC

## 2019-12-02 NOTE — ED Triage Notes (Signed)
Pt reports a HA ,sore throat and congestion forb 4 days.

## 2019-12-02 NOTE — ED Provider Notes (Signed)
[place]    CSN: [id number] Arrival date & time: 12/02/19  1055      History   Chief Complaint Chief Complaint  Patient presents with  . Hand Pain  . Sore Throat    HPI Jaime Murillo is a 59 y.o. female.   Here today for evaluation of sinus headache, sinus pressure, rhinorrhea, cough, sore throat x 4 days. Denies fever, chills, body aches, abdominal pain, N/V/D, wheezing, CP, SOB. So far using boiling water over stove and shower for steam to help clear her sinuses but otherwise not trying anything OTC. No known hx of allergic rhinitis, no sick contacts, recent travel.        Past Medical History:  Diagnosis Date  . Anemia   . Anxiety   . Depression   . History of cancer chemotherapy 07-17-2012  to  12-04-2012   right breast cancer  . HSV infection   . Hypertension   . Insomnia   . Malignant neoplasm of lower-inner quadrant of right breast of female, estrogen receptor negative [facility]) oncologist-  dr Lindi Adie--  per lov note in epic ,  no recurrence   dx 05/ 2014---  right breast IDC & DCIS, Grade 3, triple negative----  completed neo-adjuvant chemo 12-04-2012,  03-20-2013  right total mastectomy with sln bx (07-24-2013  left simple mastectomy w/ sln bx)  . Neuropathy associated with cancer ([place])    related to chemotherapy  . 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 Nocturia   . PONV (postoperative nausea and vomiting)    shaking after anesthesia; had n/v after mastectomy  . Urinary frequency   . Wears glasses     Patient Active Problem List   Diagnosis Date Noted  . Acquired absence of breast and nipple 01/07/2014  . Personal history of breast cancer 07/11/2013  . Fibroid uterus 07/04/2013  . Chemotherapy-induced peripheral neuropathy ([place]) 02/08/2013  . High blood pressure   . Contact lens/glasses fitting   . Breast cancer ([place])   . Cancer of lower-inner quadrant of RIGHT female breast 06/23/2012  . HSV (herpes simplex virus)  anogenital infection 06/06/2012  . Breast mass, right 06/06/2012  . HTN (hypertension) 05/05/2011    Past Surgical History:  Procedure Laterality Date  . Barbie Banner OSTEOTOMY Right 01/27/2018   Procedure: DOUBLE   [name];  Surgeon: Evelina Bucy, DPM;  Location: [place];  Service: Podiatry;  Laterality: Right;  POPLITEAL/SAPH BLOCK  . BREAST LUMPECTOMY Right 01/12/2013   Procedure: RIGHT SENTINEL LYMPH  NODE BIOPSY;  Surgeon: Stark Klein, MD;  Location: [place];  Service: General;  Laterality: Right;  RIGHT SENTINEL LYMPH NODE BIOPSY  . BREAST RECONSTRUCTION WITH PLACEMENT OF TISSUE EXPANDER AND FLEX HD (ACELLULAR HYDRATED DERMIS) Right 03/20/2013   Procedure: IMMEDIATE RIGHT BREAST RECONSTRUCTION WITH PLACEMENT OF TISSUE EXPANDER AND FLEX HD (ACELLULAR HYDRATED DERMIS);  Surgeon: Irene Limbo, MD;  Location: [place];  Service: Plastics;  Laterality: Right;  . BREAST RECONSTRUCTION WITH PLACEMENT OF TISSUE EXPANDER AND FLEX HD (ACELLULAR HYDRATED DERMIS) Left 07/24/2013   Procedure: BREAST RECONSTRUCTION WITH PLACEMENT OF TISSUE EXPANDER AND FLEX HD (ACELLULAR HYDRATED DERMIS) TO THE LEFT BREAST;  Surgeon: Irene Limbo, MD;  Location: [place];  Service: Plastics;  Laterality: Left;  . BREAST RECONSTRUCTION WITH PLACEMENT OF TISSUE EXPANDER AND FLEX HD (ACELLULAR HYDRATED DERMIS) Bilateral 01/07/2014   Procedure: BREAST RECONSTRUCTION WITH REMOVAL  OF TISSUE EXPANDER AND PLACEMENT  OF SILICONE IMPLANTS TO BILATERAL BREAST;  Surgeon: Irene Limbo, MD;  Location: [place];  Service: Plastics;  Laterality: Bilateral;  . COLONOSCOPY    . LAPAROSCOPIC TUBAL LIGATION Bilateral 08-10-2001    dr Ruthann Cancer  @[facility]    with cauterization  . PORT-A-CATH REMOVAL Left 01/12/2013   Procedure: REMOVAL PORT-A-CATH;  Surgeon: Stark Klein, MD;  Location: [place];  Service: General;  Laterality: Left;  . PORTACATH PLACEMENT N/A  07/06/2012   Procedure: INSERTION PORT-A-CATH;  Surgeon: Stark Klein, MD;  Location: [place];  Service: General;  Laterality: N/A;  . SIMPLE MASTECTOMY WITH AXILLARY SENTINEL NODE BIOPSY Left 07/24/2013   Procedure: SIMPLE MASTECTOMY;  Surgeon: Stark Klein, MD;  Location: [place];  Service: General;  Laterality: Left;  . TOTAL MASTECTOMY Right 03/20/2013   Procedure: RIGHT SKIN SPARING TOTAL MASTECTOMY;  Surgeon: Stark Klein, MD;  Location: [place];  Service: General;  Laterality: Right;  . TRANSTHORACIC ECHOCARDIOGRAM  07/03/2012   ef 55-60%/  mild MR and TR/  mild dilated IVC    OB History    Gravida  3   Para  2   Term  2   Preterm      AB  1   Living  2     SAB  1   TAB      Ectopic      Multiple      Live Births  2            Home Medications    Prior to Admission medications   Medication Sig Start Date End Date Taking? Authorizing Provider  gabapentin (NEURONTIN) 300 MG capsule Take 2 capsules (600 mg total) by mouth at bedtime. 11/19/14  Yes Dennie Bible, NP  hydrochlorothiazide (HYDRODIURIL) 25 MG tablet TAKE 1 TABLET BY MOUTH ONCE DAILY 12/30/14  Yes [name], MD  valACYclovir (VALTREX) 500 MG tablet Take 500 mg by mouth. 10/03/13  Yes [provider]  cephALEXin (KEFLEX) 500 MG capsule Take 1 capsule (500 mg total) by mouth 2 (two) times daily. 02/07/19   Evelina Bucy, DPM  fluconazole (DIFLUCAN) 150 MG tablet Take 1 tablet (150 mg total) by mouth once a week. 04/26/19   Evelina Bucy, DPM  fluticasone (FLONASE) 50 MCG/ACT nasal spray Place 1 spray into both nostrils 2 (two) times daily. 12/02/19   Volney American, PA-C  ibuprofen (ADVIL) 800 MG tablet Take 800 mg by mouth 3 (three) times daily. 04/26/19   [provider]  ondansetron (ZOFRAN) 4 MG tablet Take 1 tablet (4 mg total) by mouth every 8 (eight) hours as needed for nausea or vomiting. 02/07/19   Evelina Bucy, DPM  oxyCODONE (ROXICODONE)  5 MG immediate release tablet Take 2 tablets (10 mg total) by mouth every 4 (four) hours as needed for severe pain. 02/15/19   Evelina Bucy, DPM  penicillin v potassium (VEETID) 500 MG tablet Take 500 mg by mouth 4 (four) times daily. 04/26/19   [provider]  prochlorperazine (COMPAZINE) 10 MG tablet Take 1 tablet (10 mg total) by mouth every 6 (six) hours as needed (Nausea or vomiting). 11/06/12 12/13/12  Gardenia Phlegm, NP    Family History Family History  Problem Relation Age of Onset  . Heart disease Mother   . Aneurysm Mother   . Hypertension Father   . Prostate cancer Father 72  . Hypertension Sister   . Ovarian cancer Paternal Grandmother  died in her 33s  . Stomach cancer Maternal Grandmother 62  . Cancer Cousin        paternal cousin with an unknown form of cancer    Social History Social History   Tobacco Use  . Smoking status: Never Smoker  . Smokeless tobacco: Never Used  Vaping Use  . Vaping Use: Never used  Substance Use Topics  . Alcohol use: No    Alcohol/week: 0.0 standard drinks  . Drug use: No     Allergies   Lyrica [pregabalin]   Review of Systems Review of Systems PER HPI   Physical Exam Triage Vital Signs ED Triage Vitals  Enc Vitals Group     BP 12/02/19 1152 128/72     Pulse Rate 12/02/19 1152 66     Resp 12/02/19 1152 18     Temp 12/02/19 1152 97.6 F (36.4 C)     Temp Source 12/02/19 1152 Oral     SpO2 12/02/19 1152 100 %     Weight 12/02/19 1155 128 lb (58.1 kg)     Height 12/02/19 1155 5\' 7"  (1.702 m)     Head Circumference --      Peak Flow --      Pain Score 12/02/19 1155 0     Pain Loc --      Pain Edu? --      Excl. in [place]? --    No data found.  Updated Vital Signs BP 128/72 (BP Location: Left Arm)   Pulse 66   Temp 97.6 F (36.4 C) (Oral)   Resp 18   Ht 5\' 7"  (1.702 m)   Wt 128 lb (58.1 kg)   LMP 07/01/2013   SpO2 100%   BMI 20.05 kg/m   Visual Acuity Right Eye Distance:     Left Eye Distance:   Bilateral Distance:    Right Eye Near:   Left Eye Near:    Bilateral Near:     Physical Exam Vitals and nursing note reviewed.  Constitutional:      Appearance: Normal appearance. She is not ill-appearing.  HENT:     Head: Atraumatic.     Right Ear: Tympanic membrane normal.     Left Ear: Tympanic membrane normal.     Nose: Rhinorrhea present.     Comments: Nasal mucosa erythematous, edematous    Mouth/Throat:     Mouth: Mucous membranes are moist.     Pharynx: Posterior oropharyngeal erythema present.  Eyes:     Extraocular Movements: Extraocular movements intact.     Conjunctiva/sclera: Conjunctivae normal.  Cardiovascular:     Rate and Rhythm: Normal rate and regular rhythm.     Heart sounds: Normal heart sounds.  Pulmonary:     Effort: Pulmonary effort is normal.     Breath sounds: Normal breath sounds. No wheezing or rales.  Abdominal:     General: Bowel sounds are normal. There is no distension.     Palpations: Abdomen is soft.     Tenderness: There is no abdominal tenderness.  Musculoskeletal:        General: Normal range of motion.     Cervical back: Normal range of motion and neck supple.  Skin:    General: Skin is warm and dry.  Neurological:     Mental Status: She is alert and oriented to person, place, and time.  Psychiatric:        Mood and Affect: Mood normal.        Thought Content:  Thought content normal.        Judgment: Judgment normal.      UC Treatments / Results  Labs (all labs ordered are listed, but only abnormal results are displayed) Labs Reviewed - No data to display  EKG   Radiology No results found.  Procedures Procedures (including critical care time)  Medications Ordered in UC Medications - No data to display  Initial Impression / Assessment and Plan / UC Course  I have reviewed the triage vital signs and the nursing notes.  Pertinent labs & imaging results that were available during my care of  the patient were reviewed by me and considered in my medical decision making (see chart for details).     Viral vs allergic sinusitis - tx with flonase, coricidin, sinus rinses, antihistamines. Declines respiratory testing today and will quarantine instead. Return precautions and supportive care reviewed.   Final Clinical Impressions(s) / UC Diagnoses   Final diagnoses:  Rhinorrhea  Sinus pressure   Discharge Instructions   None    ED Prescriptions    Medication Sig Dispense Auth. Provider   fluticasone (FLONASE) 50 MCG/ACT nasal spray Place 1 spray into both nostrils 2 (two) times daily. 16 g Volney American, [place]     PDMP not reviewed this encounter.   Volney American, [place] 12/02/19 1242

## 2019-12-31 ENCOUNTER — Ambulatory Visit: Payer: Medicare Other

## 2020-01-07 ENCOUNTER — Ambulatory Visit: Payer: Medicare Other | Attending: Internal Medicine

## 2020-01-07 DIAGNOSIS — Z23 Encounter for immunization: Secondary | ICD-10-CM

## 2020-01-07 NOTE — Progress Notes (Signed)
   Covid-19 Vaccination Clinic  Name:  JOWANNA LOEFFLER    MRN: [id number] DOB: 1960-06-22  01/07/2020  Ms. Caperton was observed post Covid-19 immunization for 15 minutes without incident. She was provided with Vaccine Information Sheet and instruction to access the V-Safe system.   Ms. Rena was instructed to call 911 with any severe reactions post vaccine: Marland Kitchen Difficulty breathing  . Swelling of face and throat  . A fast heartbeat  . A bad rash all over body  . Dizziness and weakness   Immunizations Administered    Name Date Dose VIS Date Route   Pfizer COVID-19 Vaccine 01/07/2020  3:28 PM 0.3 mL 11/14/2019 Intramuscular   Manufacturer: [place]   Lot: [id number]   [place]: [id number]

## 2020-01-24 ENCOUNTER — Other Ambulatory Visit: Payer: Medicare Other

## 2020-02-15 ENCOUNTER — Other Ambulatory Visit: Payer: Medicare HMO

## 2020-02-15 DIAGNOSIS — Z20822 Contact with and (suspected) exposure to covid-19: Secondary | ICD-10-CM

## 2020-02-16 LAB — NOVEL CORONAVIRUS, NAA: SARS-CoV-2, NAA: NOT DETECTED

## 2020-02-16 LAB — SARS-COV-2, NAA 2 DAY TAT

## 2020-03-21 ENCOUNTER — Ambulatory Visit: Payer: Medicare HMO | Admitting: Family Medicine

## 2020-04-07 ENCOUNTER — Encounter: Payer: Self-pay | Admitting: Obstetrics and Gynecology

## 2020-04-07 ENCOUNTER — Other Ambulatory Visit: Payer: Self-pay

## 2020-04-07 ENCOUNTER — Other Ambulatory Visit (HOSPITAL_COMMUNITY)
Admission: RE | Admit: 2020-04-07 | Discharge: 2020-04-07 | Disposition: A | Discharge status: Home / Self Care | Payer: Medicare Other | Source: Ambulatory Visit | Attending: Obstetrics and Gynecology | Admitting: Obstetrics and Gynecology

## 2020-04-07 ENCOUNTER — Ambulatory Visit (INDEPENDENT_AMBULATORY_CARE_PROVIDER_SITE_OTHER): Payer: Medicare Other | Admitting: Obstetrics and Gynecology

## 2020-04-07 VITALS — BP 130/82 | HR 80 | Ht 65.0 in | Wt 127.0 lb

## 2020-04-07 DIAGNOSIS — Z01419 Encounter for gynecological examination (general) (routine) without abnormal findings: Secondary | ICD-10-CM | POA: Insufficient documentation

## 2020-04-07 DIAGNOSIS — Z113 Encounter for screening for infections with a predominantly sexual mode of transmission: Secondary | ICD-10-CM | POA: Insufficient documentation

## 2020-04-07 DIAGNOSIS — Z1211 Encounter for screening for malignant neoplasm of colon: Secondary | ICD-10-CM | POA: Diagnosis not present

## 2020-04-07 DIAGNOSIS — Z853 Personal history of malignant neoplasm of breast: Secondary | ICD-10-CM | POA: Diagnosis not present

## 2020-04-07 NOTE — Progress Notes (Signed)
GYNECOLOGY ANNUAL PREVENTATIVE CARE ENCOUNTER NOTE  History:     ALICYN KLANN is a 60 y.o. G63P2012 female here for a routine annual gynecologic exam.  Current complaints: vaginal dryness, dyspareunia.   Denies abnormal vaginal bleeding, discharge, pelvic pain, or other gynecologic concerns.   Pt notes increased vaginal dryness since her chemotherapy and resulting menopause.  They have tried some lubricants, but no topical estrogens.  Per pt she was triple receptor negative for her breast cancer, but she is still wary of hormones.   Gynecologic History Patient's last menstrual period was 07/01/2013. Contraception: post menopausal status Last Pap: 06/13/2014. Results were: normal with negative HPV Last mammogram: 2014. Pt s/p breast cancer with bilateral mastectomy and chemotherapy,pt gets yearly [place] from her surgeon  Obstetric History OB History  Gravida Para Term Preterm AB Living  3 2 2   1 2   SAB IAB Ectopic Multiple Live Births  1       2    # Outcome Date GA Lbr Len/2nd Weight Sex Delivery Anes PTL Lv  3 SAB           2 Term     M Vag-Spont  N LIV  1 Term     M Vag-Spont  N LIV    Past Medical History:  Diagnosis Date  . Anemia   . Anxiety   . Depression   . History of cancer chemotherapy 07-17-2012  to  12-04-2012   right breast cancer  . HSV infection   . Hypertension   . Insomnia   . Malignant neoplasm of lower-inner quadrant of right breast of female, estrogen receptor negative [facility]) oncologist-  dr Lindi Adie--  per lov note in epic ,  no recurrence   dx 05/ 2014---  right breast IDC & DCIS, Grade 3, triple negative----  completed neo-adjuvant chemo 12-04-2012,  03-20-2013  right total mastectomy with sln bx (07-24-2013  left simple mastectomy w/ sln bx)  . Neuropathy associated with cancer ([place])    related to chemotherapy  . 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 Nocturia   . PONV (postoperative nausea and vomiting)    shaking after anesthesia;  had n/v after mastectomy  . Urinary frequency   . Wears glasses     Past Surgical History:  Procedure Laterality Date  . Barbie Banner OSTEOTOMY Right 01/27/2018   Procedure: DOUBLE   [name];  Surgeon: Evelina Bucy, DPM;  Location: [place];  Service: Podiatry;  Laterality: Right;  POPLITEAL/SAPH BLOCK  . BREAST LUMPECTOMY Right 01/12/2013   Procedure: RIGHT SENTINEL LYMPH  NODE BIOPSY;  Surgeon: Stark Klein, MD;  Location: [place];  Service: General;  Laterality: Right;  RIGHT SENTINEL LYMPH NODE BIOPSY  . BREAST RECONSTRUCTION WITH PLACEMENT OF TISSUE EXPANDER AND FLEX HD (ACELLULAR HYDRATED DERMIS) Right 03/20/2013   Procedure: IMMEDIATE RIGHT BREAST RECONSTRUCTION WITH PLACEMENT OF TISSUE EXPANDER AND FLEX HD (ACELLULAR HYDRATED DERMIS);  Surgeon: Irene Limbo, MD;  Location: [place];  Service: Plastics;  Laterality: Right;  . BREAST RECONSTRUCTION WITH PLACEMENT OF TISSUE EXPANDER AND FLEX HD (ACELLULAR HYDRATED DERMIS) Left 07/24/2013   Procedure: BREAST RECONSTRUCTION WITH PLACEMENT OF TISSUE EXPANDER AND FLEX HD (ACELLULAR HYDRATED DERMIS) TO THE LEFT BREAST;  Surgeon: Irene Limbo, MD;  Location: [place];  Service: Plastics;  Laterality: Left;  . BREAST RECONSTRUCTION WITH PLACEMENT OF TISSUE EXPANDER AND FLEX HD (ACELLULAR HYDRATED DERMIS) Bilateral 01/07/2014  Procedure: BREAST RECONSTRUCTION WITH REMOVAL  OF TISSUE EXPANDER AND PLACEMENT  OF SILICONE IMPLANTS TO BILATERAL BREAST;  Surgeon: Irene Limbo, MD;  Location: [place];  Service: Plastics;  Laterality: Bilateral;  . COLONOSCOPY    . LAPAROSCOPIC TUBAL LIGATION Bilateral 08-10-2001    dr Ruthann Cancer  @[facility]    with cauterization  . PORT-A-CATH REMOVAL Left 01/12/2013   Procedure: REMOVAL PORT-A-CATH;  Surgeon: Stark Klein, MD;  Location: [place];  Service: General;  Laterality: Left;  . PORTACATH PLACEMENT N/A 07/06/2012   Procedure: INSERTION  PORT-A-CATH;  Surgeon: Stark Klein, MD;  Location: [place];  Service: General;  Laterality: N/A;  . SIMPLE MASTECTOMY WITH AXILLARY SENTINEL NODE BIOPSY Left 07/24/2013   Procedure: SIMPLE MASTECTOMY;  Surgeon: Stark Klein, MD;  Location: [place];  Service: General;  Laterality: Left;  . TOTAL MASTECTOMY Right 03/20/2013   Procedure: RIGHT SKIN SPARING TOTAL MASTECTOMY;  Surgeon: Stark Klein, MD;  Location: [place];  Service: General;  Laterality: Right;  . TRANSTHORACIC ECHOCARDIOGRAM  07/03/2012   ef 55-60%/  mild MR and TR/  mild dilated IVC    Current Outpatient Medications on File Prior to Visit  Medication Sig Dispense Refill  . gabapentin (NEURONTIN) 300 MG capsule Take 2 capsules (600 mg total) by mouth at bedtime. 180 capsule 2  . hydrochlorothiazide (HYDRODIURIL) 25 MG tablet TAKE 1 TABLET BY MOUTH ONCE DAILY 30 tablet 5  . valACYclovir (VALTREX) 500 MG tablet Take 500 mg by mouth.    . cephALEXin (KEFLEX) 500 MG capsule Take 1 capsule (500 mg total) by mouth 2 (two) times daily. (Patient not taking: Reported on 04/07/2020) 14 capsule 0  . fluconazole (DIFLUCAN) 150 MG tablet Take 1 tablet (150 mg total) by mouth once a week. 6 tablet 1  . fluticasone (FLONASE) 50 MCG/ACT nasal spray Place 1 spray into both nostrils 2 (two) times daily. 16 g 2  . ibuprofen (ADVIL) 800 MG tablet Take 800 mg by mouth 3 (three) times daily.    . ondansetron (ZOFRAN) 4 MG tablet Take 1 tablet (4 mg total) by mouth every 8 (eight) hours as needed for nausea or vomiting. 20 tablet 0  . oxyCODONE (ROXICODONE) 5 MG immediate release tablet Take 2 tablets (10 mg total) by mouth every 4 (four) hours as needed for severe pain. 40 tablet 0  . penicillin v potassium (VEETID) 500 MG tablet Take 500 mg by mouth 4 (four) times daily.    . [DISCONTINUED] prochlorperazine (COMPAZINE) 10 MG tablet Take 1 tablet (10 mg total) by mouth every 6 (six) hours as needed (Nausea or vomiting). 30 tablet 1   No  current facility-administered medications on file prior to visit.    Allergies  Allergen Reactions  . Lyrica [Pregabalin] Other (See Comments)    Hallucinations and bad dreams    Social History:  reports that she has never smoked. She has never used smokeless tobacco. She reports that she does not drink alcohol and does not use drugs.  Family History  Problem Relation Age of Onset  . Heart disease Mother   . Aneurysm Mother   . Hypertension Father   . Prostate cancer Father 52  . Hypertension Sister   . Ovarian cancer Paternal Grandmother        died in her 69s  . Stomach cancer Maternal Grandmother 42  . Cancer Cousin        paternal cousin with an unknown form of cancer    The following  portions of the patient's history were reviewed and updated as appropriate: allergies, current medications, past family history, past medical history, past social history, past surgical history and problem list.  Review of Systems Pertinent items noted in HPI and remainder of comprehensive ROS otherwise negative.  Physical Exam:  BP 130/82   Pulse 80   Ht 5\' 5"  (1.651 m)   Wt 127 lb (57.6 kg)   LMP 07/01/2013   BMI 21.13 kg/m  CONSTITUTIONAL: Well-developed, well-nourished female in no acute distress.  HENT:  Normocephalic, atraumatic, External right and left ear normal. Oropharynx is clear and moist EYES: Conjunctivae and EOM are normal. Pupils are equal, round, and reactive to light. No scleral icterus.  NECK: Normal range of motion, supple, no masses.  Normal thyroid.  SKIN: Skin is warm and dry. No rash noted. Not diaphoretic. No erythema. No pallor. MUSCULOSKELETAL: Normal range of motion. No tenderness.  No cyanosis, clubbing, or edema.  2+ distal pulses. NEUROLOGIC: Alert and oriented to person, place, and time. Normal reflexes, muscle tone coordination.  PSYCHIATRIC: Normal mood and affect. Normal behavior. Normal judgment and thought content. CARDIOVASCULAR: Normal heart rate  noted, regular rhythm RESPIRATORY: Clear to auscultation bilaterally. Effort and breath sounds normal, no problems with respiration noted. BREASTS: Symmetric in size. S/p bilateral mastectomy, implants detected, nipples absent, no lymphadenopathy bilaterally. Performed in the presence of a chaperone. ABDOMEN: Soft, no distention noted.  No tenderness, rebound or guarding.  PELVIC: Normal appearing external genitalia and urethral meatus; atrophic appearing vaginal mucosa and healing right vaginal teat (old), normal cervix.  No abnormal discharge noted.  Pap smear obtained.  Normal uterine size, no other palpable masses, no uterine or adnexal tenderness.  Performed in the presence of a chaperone.   Assessment and Plan:    1. Women's annual routine gynecological examination Yearly visits, , colonoscopy ordered, pt takes suppression valtrex for HSV, ok for refills as needed Discussed vaginal estrogen with surgery clearance for postmenopausal dyspareunia.  Pt declines at this time. - Cytology - PAP( [facility])  2. History of breast cancer Breast u/s per her surgeon, per uptodate and surgery no mammogramat this time  3. Screening examination for STD (sexually transmitted disease) Per pt request. - RPR - Hepatitis B surface antigen - Hepatitis C antibody - HIV Antibody (routine testing w rflx)  4. Screening for colon cancer  - Ambulatory referral to Gastroenterology  Will follow up results of pap smear and manage accordingly. Routine preventative health maintenance measures emphasized. Please refer to After Visit Summary for other counseling recommendations.      Lynnda Shields, MD, [place] [facility], [place]

## 2020-04-08 ENCOUNTER — Encounter: Payer: Self-pay | Admitting: *Deleted

## 2020-04-08 LAB — HIV ANTIBODY (ROUTINE TESTING W REFLEX): HIV Screen 4th Generation wRfx: NONREACTIVE

## 2020-04-08 LAB — CYTOLOGY - PAP
Chlamydia: NEGATIVE
Comment: NEGATIVE
Comment: NEGATIVE
Comment: NEGATIVE
Comment: NORMAL
Diagnosis: NEGATIVE
High risk HPV: NEGATIVE
Neisseria Gonorrhea: NEGATIVE
Trichomonas: NEGATIVE

## 2020-04-08 LAB — HEPATITIS C ANTIBODY: Hep C Virus Ab: <0.1 s/co ratio (ref 0.0–0.9)

## 2020-04-08 LAB — HEPATITIS B SURFACE ANTIGEN: Hepatitis B Surface Ag: NEGATIVE

## 2020-04-08 LAB — RPR: RPR Ser Ql: NONREACTIVE

## 2020-04-17 DIAGNOSIS — I10 Essential (primary) hypertension: Secondary | ICD-10-CM | POA: Diagnosis not present

## 2020-04-17 DIAGNOSIS — Z23 Encounter for immunization: Secondary | ICD-10-CM | POA: Diagnosis not present

## 2020-04-17 DIAGNOSIS — Z008 Encounter for other general examination: Secondary | ICD-10-CM | POA: Diagnosis not present

## 2020-06-09 DIAGNOSIS — Z9013 Acquired absence of bilateral breasts and nipples: Secondary | ICD-10-CM | POA: Diagnosis not present

## 2020-06-09 DIAGNOSIS — Z853 Personal history of malignant neoplasm of breast: Secondary | ICD-10-CM | POA: Diagnosis not present

## 2020-06-11 DIAGNOSIS — Z20822 Contact with and (suspected) exposure to covid-19: Secondary | ICD-10-CM | POA: Diagnosis not present

## 2020-06-18 ENCOUNTER — Telehealth: Payer: Self-pay | Admitting: Adult Health

## 2020-06-18 NOTE — Telephone Encounter (Signed)
Rescheduled appointment per provider pal. Patient is aware.

## 2020-07-03 ENCOUNTER — Other Ambulatory Visit: Payer: Medicare Other

## 2020-07-08 ENCOUNTER — Encounter: Payer: Medicare Other | Admitting: Adult Health

## 2020-07-21 ENCOUNTER — Inpatient Hospital Stay: Payer: Medicare Other | Attending: Adult Health | Admitting: Adult Health

## 2020-07-21 ENCOUNTER — Encounter: Payer: Self-pay | Admitting: Adult Health

## 2020-07-21 ENCOUNTER — Other Ambulatory Visit: Payer: Self-pay

## 2020-07-21 VITALS — BP 137/89 | HR 69 | Temp 98.1°F | Resp 18 | Ht 65.0 in | Wt 128.0 lb

## 2020-07-21 DIAGNOSIS — Z9013 Acquired absence of bilateral breasts and nipples: Secondary | ICD-10-CM | POA: Diagnosis not present

## 2020-07-21 DIAGNOSIS — Z79899 Other long term (current) drug therapy: Secondary | ICD-10-CM | POA: Diagnosis not present

## 2020-07-21 DIAGNOSIS — Z171 Estrogen receptor negative status [ER-]: Secondary | ICD-10-CM | POA: Diagnosis not present

## 2020-07-21 DIAGNOSIS — I10 Essential (primary) hypertension: Secondary | ICD-10-CM | POA: Insufficient documentation

## 2020-07-21 DIAGNOSIS — Z8042 Family history of malignant neoplasm of prostate: Secondary | ICD-10-CM | POA: Insufficient documentation

## 2020-07-21 DIAGNOSIS — Z800 Family history of malignant neoplasm of digestive organs: Secondary | ICD-10-CM | POA: Diagnosis not present

## 2020-07-21 DIAGNOSIS — Z8041 Family history of malignant neoplasm of ovary: Secondary | ICD-10-CM | POA: Diagnosis not present

## 2020-07-21 DIAGNOSIS — G620 Drug-induced polyneuropathy: Secondary | ICD-10-CM | POA: Insufficient documentation

## 2020-07-21 DIAGNOSIS — T451X5A Adverse effect of antineoplastic and immunosuppressive drugs, initial encounter: Secondary | ICD-10-CM | POA: Diagnosis not present

## 2020-07-21 DIAGNOSIS — Z8249 Family history of ischemic heart disease and other diseases of the circulatory system: Secondary | ICD-10-CM | POA: Insufficient documentation

## 2020-07-21 DIAGNOSIS — Z808 Family history of malignant neoplasm of other organs or systems: Secondary | ICD-10-CM | POA: Diagnosis not present

## 2020-07-21 DIAGNOSIS — C50311 Malignant neoplasm of lower-inner quadrant of right female breast: Secondary | ICD-10-CM | POA: Insufficient documentation

## 2020-07-21 NOTE — Progress Notes (Signed)
CLINIC:  Survivorship   REASON FOR VISIT:  Routine follow-up for history of breast cancer.   BRIEF ONCOLOGIC HISTORY:  Oncology History  Cancer of lower-inner quadrant of RIGHT female breast  06/20/2012 Initial Biopsy   Right breast needle core biopsy (3 o'clock): Grade 3, IDC & DCIS.  ER- (0%), PR- (0%), HER2- (ratio 1.26). Ki67 95%.     06/20/2012 Breast US   Right breast with slightly irregular oval hypoechoic mass measuring 7 x 6 x 17 mm at 3 o'clock postion. No mammographic evidence of breast malignancy bilaterally. Palpable area of concern at 12 o'clock, but no suspicious findings at this location on [place].     06/23/2012 Initial Diagnosis   Cancer of lower-inner quadrant of RIGHT female breast    06/26/2012 Breast MRI   Right breast w/clumped NME at 2 o'clock position measuring 0.8 x 1.6 x 2.2 cm located anterior/superior to known cancer. Also 6 mm ill-defined ovoid mass posterior lateral to bx-proven ca, lying superficial pect. muscle. Likely multifocal disease.     07/03/2012 Echocardiogram   Pre-treatment EF: 55-60%    07/03/2012 Imaging   CT c/a/p: No evidence of metastatic disease.     07/05/2012 Procedure   Genetic counseling/testing: OncoGeneDX with 3 VUS identified. 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  Also heteroxygous for 2 likely benign variants on BRCA1 and NBN.  Remainder of genes in GeneDX panel negative.    07/12/2012 Procedure   Right breast needle core biopsy: Benign breast tissue with focal pseudoangiomatous stromal hyperplasic (PASH). No evidence of malignancy.     07/17/2012 - 08/28/2012 Neo-Adjuvant Chemotherapy   Adriamycin & Cytoxan x 4 cycles completed.     09/11/2012 - 10/23/2012 Neo-Adjuvant Chemotherapy   Taxol/Carboplatin weekly x 7 cycles (stopped due to peripheral neuropathy; changed to Gemcitabine/Carbo for additional 3 cycles).    11/06/2012 - 12/04/2012 Neo-Adjuvant Chemotherapy   Gemcitabine/Carboplatin x 3 cycles  completed.     11/24/2012 Breast MRI   Previous right breast multifocal malignancy with complete imaging resolution.  Stable 6 mm hemangioma or cyst in liver.     01/12/2013 Surgery   Right axillary SLNB ([place]):  3 SLNs negative.  Port also removed on this date.     03/20/2013 Definitive Surgery   Right skin-sparing total mastectomy Barry Dienes) with immediate reconstruction: No atypia or malignancy identified.  Benign skin & nipple. s/p neoadjuvant therapy.  Represents a complete response. Placement of tissue expanders.     03/20/2013 Pathologic Stage   ypT0; No evidence of cancer.     07/24/2013 Surgery   LEFT simple mastectomy with immediate reconstruction; showed ALH and fibroadenoma. Placement of tissue expanders.     01/07/2014 Surgery   Bilateral breast reconstruction ([place]): Removal of bilateral tissue expanders and placement of bilateral silicone breast implants.     06/03/2014 Survivorship   Survivorship Care Plan given to patient and reveiwed with her during in-person visit.        INTERVAL HISTORY:  Jaime Murillo presents to the Survivorship Clinic today for routine follow-up for her history of breast cancer.  Overall, she reports feeling quite well.  She is up to date with visits with her PCP and with Dr. Iran Planas.    She has no current issues and tells me she recently attended the [employer] over the weekend about intimacy.  She really enjoyed the topic.  She notes that she continues to do well.  Works part time and has remained active.  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  She did have a pain in her breast and saw Dr. Iran Planas for it for work up, and since then it has resolved.  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  This is stable.    REVIEW OF SYSTEMS:  Review of Systems  Constitutional:  Negative for appetite change, chills, fatigue, fever and unexpected weight change.  HENT:    Negative for hearing loss, lump/mass and trouble swallowing.   Eyes:  Negative for eye problems and icterus.  Respiratory:  Negative for chest tightness, cough and shortness of breath.   Cardiovascular:  Negative for chest pain, leg swelling and palpitations.  Gastrointestinal:  Negative for abdominal distention, abdominal pain, constipation, diarrhea, nausea and vomiting.  Endocrine: Negative for hot flashes.  Genitourinary:  Positive for dyspareunia. Negative for difficulty urinating.   Musculoskeletal:  Negative for arthralgias.  Skin:  Negative for itching and rash.  Neurological:  Positive for numbness (stable peripheral neruopathy in toes and fingertips). Negative for dizziness, extremity weakness and headaches.  Hematological:  Negative for adenopathy. Does not bruise/bleed easily.  Psychiatric/Behavioral:  Negative for depression. The patient is not nervous/anxious.  Breast: Denies any new nodularity, masses, tenderness, nipple changes, or nipple discharge.       PAST MEDICAL/SURGICAL HISTORY:  Past Medical History:  Diagnosis Date   Anemia    Anxiety    Depression    History of cancer chemotherapy 07-17-2012  to  12-04-2012   right breast cancer   HSV infection    Hypertension    Insomnia    Malignant neoplasm of lower-inner quadrant of right breast of female, estrogen receptor negative [facility]) oncologist-  dr Lindi Adie--  per lov note in epic ,  no recurrence   dx 05/ 2014---  right breast IDC & DCIS, Grade 3, triple negative----  completed neo-adjuvant chemo 12-04-2012,  03-20-2013  right total mastectomy with sln bx (07-24-2013  left simple mastectomy w/ sln bx)   Neuropathy associated with cancer ([place])    related to chemotherapy   Neuropathy of both feet    DUE TO CHEMO PER PATIENT   Nocturia    PONV (postoperative nausea and vomiting)    shaking after anesthesia; had n/v after mastectomy   Urinary frequency    Wears glasses    Past Surgical History:  Procedure  Laterality Date   AIKEN OSTEOTOMY Right 01/27/2018   Procedure: DOUBLE   [name];  Surgeon: Evelina Bucy, DPM;  Location: [place];  Service: Podiatry;  Laterality: Right;  POPLITEAL/SAPH BLOCK   BREAST LUMPECTOMY Right 01/12/2013   Procedure: RIGHT SENTINEL LYMPH  NODE BIOPSY;  Surgeon: Stark Klein, MD;  Location: [place];  Service: General;  Laterality: Right;  RIGHT SENTINEL LYMPH NODE BIOPSY   BREAST RECONSTRUCTION WITH PLACEMENT OF TISSUE EXPANDER AND FLEX HD (ACELLULAR HYDRATED DERMIS) Right 03/20/2013   Procedure: IMMEDIATE RIGHT BREAST RECONSTRUCTION WITH PLACEMENT OF TISSUE EXPANDER AND FLEX HD (ACELLULAR HYDRATED DERMIS);  Surgeon: Irene Limbo, MD;  Location: [place];  Service: Plastics;  Laterality: Right;   BREAST RECONSTRUCTION WITH PLACEMENT OF TISSUE EXPANDER AND FLEX HD (ACELLULAR HYDRATED DERMIS) Left 07/24/2013   Procedure: BREAST RECONSTRUCTION WITH PLACEMENT OF TISSUE EXPANDER AND FLEX HD (ACELLULAR HYDRATED DERMIS) TO THE LEFT BREAST;  Surgeon: Irene Limbo, MD;  Location: [place];  Service: Plastics;  Laterality: Left;   BREAST RECONSTRUCTION WITH PLACEMENT OF TISSUE EXPANDER AND FLEX HD (ACELLULAR HYDRATED  DERMIS) Bilateral 01/07/2014   Procedure: BREAST RECONSTRUCTION WITH REMOVAL  OF TISSUE EXPANDER AND PLACEMENT  OF SILICONE IMPLANTS TO BILATERAL BREAST;  Surgeon: Irene Limbo, MD;  Location: [place];  Service: Plastics;  Laterality: Bilateral;   COLONOSCOPY     LAPAROSCOPIC TUBAL LIGATION Bilateral 08-10-2001    dr Ruthann Cancer  $Remov'@WH'dPslaj$    with cauterization   PORT-A-CATH REMOVAL Left 01/12/2013   Procedure: REMOVAL PORT-A-CATH;  Surgeon: Stark Klein, MD;  Location: [place];  Service: General;  Laterality: Left;   PORTACATH PLACEMENT N/A 07/06/2012   Procedure: INSERTION PORT-A-CATH;  Surgeon: Stark Klein, MD;  Location: [place];  Service: General;  Laterality: N/A;   SIMPLE MASTECTOMY  WITH AXILLARY SENTINEL NODE BIOPSY Left 07/24/2013   Procedure: SIMPLE MASTECTOMY;  Surgeon: Stark Klein, MD;  Location: [place];  Service: General;  Laterality: Left;   TOTAL MASTECTOMY Right 03/20/2013   Procedure: RIGHT SKIN SPARING TOTAL MASTECTOMY;  Surgeon: Stark Klein, MD;  Location: [place];  Service: General;  Laterality: Right;   TRANSTHORACIC ECHOCARDIOGRAM  07/03/2012   ef 55-60%/  mild MR and TR/  mild dilated IVC     ALLERGIES:  Allergies  Allergen Reactions   Lyrica [Pregabalin] Other (See Comments)    Hallucinations and bad dreams     CURRENT MEDICATIONS:  Outpatient Encounter Medications as of 07/21/2020  Medication Sig Note   gabapentin (NEURONTIN) 300 MG capsule Take 2 capsules (600 mg total) by mouth at bedtime.    hydrochlorothiazide (HYDRODIURIL) 25 MG tablet TAKE 1 TABLET BY MOUTH ONCE DAILY    ibuprofen (ADVIL) 800 MG tablet Take 800 mg by mouth 3 (three) times daily.    oxyCODONE (ROXICODONE) 5 MG immediate release tablet Take 2 tablets (10 mg total) by mouth every 4 (four) hours as needed for severe pain.    valACYclovir (VALTREX) 500 MG tablet Take 500 mg by mouth daily.    fluticasone (FLONASE) 50 MCG/ACT nasal spray Place 1 spray into both nostrils 2 (two) times daily. (Patient not taking: Reported on 07/21/2020)    [DISCONTINUED] cephALEXin (KEFLEX) 500 MG capsule Take 1 capsule (500 mg total) by mouth 2 (two) times daily. (Patient not taking: Reported on 04/07/2020) 12/02/2019: Pt not taking   [DISCONTINUED] fluconazole (DIFLUCAN) 150 MG tablet Take 1 tablet (150 mg total) by mouth once a week. 12/02/2019: PT not taking   [DISCONTINUED] ondansetron (ZOFRAN) 4 MG tablet Take 1 tablet (4 mg total) by mouth every 8 (eight) hours as needed for nausea or vomiting. 12/02/2019: PT not taking   [DISCONTINUED] penicillin v potassium (VEETID) 500 MG tablet Take 500 mg by mouth 4 (four) times daily. 12/02/2019: PT not taking   [DISCONTINUED] prochlorperazine  (COMPAZINE) 10 MG tablet Take 1 tablet (10 mg total) by mouth every 6 (six) hours as needed (Nausea or vomiting).    No facility-administered encounter medications on file as of 07/21/2020.     ONCOLOGIC FAMILY HISTORY:  Family History  Problem Relation Age of Onset   Heart disease Mother    Aneurysm Mother    Hypertension Father    Prostate cancer Father 27   Hypertension Sister    Ovarian cancer Paternal Grandmother        died in her 33s   Stomach cancer Maternal Grandmother 35   Cancer Cousin        paternal cousin with an unknown form of cancer    GENETIC COUNSELING/TESTING: See above  SOCIAL HISTORY:  Social History   Socioeconomic  History   Marital status: Single    Spouse name: Not on file   Number of children: 2   Years of education: 55   Highest education level: Not on file  Occupational History    Comment: Disabled  Tobacco Use   Smoking status: Never   Smokeless tobacco: Never  Vaping Use   Vaping Use: Never used  Substance and Sexual Activity   Alcohol use: No    Alcohol/week: 0.0 standard drinks   Drug use: No   Sexual activity: Yes    Birth control/protection: Surgical    Comment: BTL  Other Topics Concern   Not on file  Social History Narrative   Patient lives at home with her son she is single.   Disabled.   Education- One year of college.   Right handed.   Caffeine- One cup daily.   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 To graduate May 2020   Social Determinants of Health   Financial Resource Strain: Not on file  Food Insecurity: Food Insecurity Present   Worried About Charity fundraiser in the Last Year: Often true   Arboriculturist in the Last Year: Often true  Transportation Needs: Public librarian (Medical): Yes   Lack of Transportation (Non-Medical): Yes  Physical Activity: Not on file  Stress: Not on file  Social Connections: Not on file  Intimate Partner Violence:  Not on file       PHYSICAL EXAMINATION:  Vital Signs: Vitals:   07/21/20 1117  BP: 137/89  Pulse: 69  Resp: 18  Temp: 98.1 F (36.7 C)  SpO2: 100%   Filed Weights   07/21/20 1117  Weight: 128 lb (58.1 kg)   General: Well-nourished, well-appearing female in no acute distress.  Unaccompanied today.   HEENT: Head is normocephalic.  Pupils equal and reactive to light. Conjunctivae clear without exudate.  Sclerae anicteric. Oral mucosa is pink, moist.  Oropharynx is pink without lesions or erythema.  Lymph: No cervical, supraclavicular, or infraclavicular lymphadenopathy noted on palpation.  Cardiovascular: Regular rate and rhythm.Marland Kitchen Respiratory: Clear to auscultation bilaterally. Chest expansion symmetric; breathing non-labored.  Breast Exam:  -Breasts: bilateral breasts s/p mastectomy and reconstruction, no nodules or swelling noted, no sign of local recurrence -Axilla: No axillary adenopathy bilaterally.  GI: Abdomen soft and round; non-tender, non-distended. Bowel sounds normoactive. No hepatosplenomegaly.   GU: Deferred.  Neuro: No focal deficits. Steady gait.  Psych: Mood and affect normal and appropriate for situation.  MSK: No focal spinal tenderness to palpation, full range of motion in bilateral upper extremities Extremities: No edema. Skin: Warm and dry.  LABORATORY DATA:  None for this visit   DIAGNOSTIC IMAGING:  Most recent mammogram:   N/a s/p bilateral mastecomies    ASSESSMENT AND PLAN:  Ms.. Murillo is a pleasant 60 y.o. female with history of Stage IA right breast invasive ductal carcinoma, ER-/PR-/HER2-, diagnosed in 06/2012, treated with neoadjuvant chemotherapy and bilateral mastectomies.  She presents to the Survivorship Clinic for surveillance and routine follow-up.   1. History of breast cancer:  Jaime Murillo is currently clinically and radiographically without evidence of disease or recurrence of breast cancer. She will return to the cancer center in one  year for routine surveillance and monitoring.  She will also continue to see Dr. Iran Planas annually.  I encouraged her to call me with any questions or concerns before her next visit at the cancer center, and I  would be happy to see her sooner, if needed.    2. Chemotherapy induced peripheral neuropathy: She manages this mostly with extra strength tylenol.  This is stable and she has no motor deficits related to this.      3. Cancer screening:  Due to Jaime Murillo's history and her age, she should receive screening for skin cancers, colon cancer, and gynecologic cancers. She was encouraged to follow-up with her PCP for appropriate cancer screenings.   4. Health maintenance and wellness promotion: Jaime Murillo was encouraged to consume 5-7 servings of fruits and vegetables per day. She was also encouraged to engage in moderate to vigorous exercise for 30 minutes per day most days of the week. She was instructed to limit her alcohol consumption and continue to abstain from tobacco use.    Dispo:  -Return to cancer center in one year for LTS follow up   Total encounter time: 20 minutes* in face to face visit time, chart review, and documentation of the encounter.*   Gardenia Phlegm, NP Survivorship [facility] [place] [phone]  *Total Encounter Time as defined by the Centers for Medicare and Medicaid Services includes, in addition to the face-to-face time of a patient visit (documented in the note above) non-face-to-face time: obtaining and reviewing outside history, ordering and reviewing medications, tests or procedures, care coordination (communications with other health care professionals or caregivers) and documentation in the medical record.   Note: PRIMARY CARE PROVIDER Arthur Holms, Mur[place] [phone]

## 2020-07-25 ENCOUNTER — Telehealth: Payer: Self-pay | Admitting: Adult Health

## 2020-07-25 NOTE — Telephone Encounter (Signed)
Sch per 06/24 los , pt aware

## 2020-11-28 DIAGNOSIS — Z9013 Acquired absence of bilateral breasts and nipples: Secondary | ICD-10-CM | POA: Diagnosis not present

## 2020-11-28 DIAGNOSIS — Z23 Encounter for immunization: Secondary | ICD-10-CM | POA: Diagnosis not present

## 2020-11-28 DIAGNOSIS — Z0000 Encounter for general adult medical examination without abnormal findings: Secondary | ICD-10-CM | POA: Diagnosis not present

## 2020-11-28 DIAGNOSIS — Z79899 Other long term (current) drug therapy: Secondary | ICD-10-CM | POA: Diagnosis not present

## 2020-11-28 DIAGNOSIS — I10 Essential (primary) hypertension: Secondary | ICD-10-CM | POA: Diagnosis not present

## 2020-11-28 DIAGNOSIS — Z0001 Encounter for general adult medical examination with abnormal findings: Secondary | ICD-10-CM | POA: Diagnosis not present

## 2020-11-28 DIAGNOSIS — T451X5A Adverse effect of antineoplastic and immunosuppressive drugs, initial encounter: Secondary | ICD-10-CM | POA: Diagnosis not present

## 2020-11-28 DIAGNOSIS — G620 Drug-induced polyneuropathy: Secondary | ICD-10-CM | POA: Diagnosis not present

## 2020-11-28 DIAGNOSIS — Z853 Personal history of malignant neoplasm of breast: Secondary | ICD-10-CM | POA: Diagnosis not present

## 2021-06-26 ENCOUNTER — Telehealth: Payer: Self-pay | Admitting: Adult Health

## 2021-06-26 NOTE — Telephone Encounter (Signed)
Rescheduled appointment per room/resource. Patient is aware of the changes made to her upcoming appointment. 

## 2021-07-21 ENCOUNTER — Encounter: Payer: Medicare Other | Admitting: Adult Health

## 2021-07-21 ENCOUNTER — Inpatient Hospital Stay: Payer: Medicare Other | Attending: Adult Health | Admitting: Adult Health

## 2021-09-11 ENCOUNTER — Telehealth: Payer: Self-pay | Admitting: *Deleted

## 2021-09-11 NOTE — Telephone Encounter (Signed)
"  Jaime Murillo [phone] (home) calling to see if you all will complete a leave of absence form.  [employer] e-mailed form I am to return by September 1st.  Tried returning to work after several years but have missed so many days using sick time since March 2023, being written up and will lose my job without FMLA.  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  Long-term survivor diagnosed with breast cancer in 2014.  Forgot ladies name I saw in June and return in September.  Neuropathy symptoms after four different chemotherapies, also destroyed my gums and teeth.  Originally did not like the way Gabapentin made me feel drowsy and numb.  Currently using Tylenol, ibuprofen, heating pad, compression socks, Epsom salt soaks, Voltaren cream as recommended by PCP.    I change from [place] care to [facility] and was seen about two weeks ago.  (This nurse noted gabapentin filled last on 11/19/2014 by other provider at [facility].  Unable to obtain expiration date.)  Not at home to tell you providers name or how many gabapentin I have left.  Now, I use a pharmacy [facility] on [place].  Lot of gabapentin left so when neuropathy gets bad, I use 100 mg to 400 mg trying to prevent cramping pain in legs and feet.  At times I am unable to walk, severe headaches or migraines, abdominal cramping, dizziness and nausea with neuropathy and treatments received.  Do not drive due to headaches, can barely get to work.  Out of work up to two days every two to three weeks or work 8:00 am to 12:00 pm having to leave unable to work last four hours.  Sorry I'm crying but depressed, having anxiety and panic attacks.  Was on disability, did not have insurance and do not want to lose my job."  Informed during call, this nurse will notify providers of request for intermittent FMLA LOA and return call with further advice.  Provided forms  e-mail to forward [employer] application.  Advised of [place] cover sheet, [place] and forms process turnover time and Accommodation Act available to new employees who do not yet qualify for FMLA. Currently no further questions or needs.    I

## 2021-09-17 ENCOUNTER — Telehealth: Payer: Self-pay | Admitting: *Deleted

## 2021-09-17 NOTE — Telephone Encounter (Signed)
Received call from pt with complaint of ongoing and worsening bilateral lower extremity neuropathy from past chemotherapy tx's.  Pt states neuropathy is beginning to affect ADL's and work.  Pt requesting f/u with Mendel Ryder for further evaluation and tx of neuropathy.

## 2021-09-22 ENCOUNTER — Other Ambulatory Visit: Payer: Self-pay

## 2021-09-22 ENCOUNTER — Inpatient Hospital Stay: Payer: Medicare Other | Attending: Adult Health | Admitting: Adult Health

## 2021-09-22 ENCOUNTER — Encounter: Payer: Self-pay | Admitting: Adult Health

## 2021-09-22 ENCOUNTER — Telehealth: Payer: Self-pay | Admitting: Internal Medicine

## 2021-09-22 ENCOUNTER — Encounter: Payer: Self-pay | Admitting: General Practice

## 2021-09-22 ENCOUNTER — Inpatient Hospital Stay: Payer: Medicare Other

## 2021-09-22 VITALS — BP 115/92 | HR 85 | Temp 97.5°F | Resp 18 | Ht 65.0 in | Wt 120.1 lb

## 2021-09-22 DIAGNOSIS — R5383 Other fatigue: Secondary | ICD-10-CM | POA: Diagnosis not present

## 2021-09-22 DIAGNOSIS — Z8619 Personal history of other infectious and parasitic diseases: Secondary | ICD-10-CM | POA: Diagnosis not present

## 2021-09-22 DIAGNOSIS — Z171 Estrogen receptor negative status [ER-]: Secondary | ICD-10-CM | POA: Diagnosis not present

## 2021-09-22 DIAGNOSIS — E559 Vitamin D deficiency, unspecified: Secondary | ICD-10-CM | POA: Insufficient documentation

## 2021-09-22 DIAGNOSIS — R200 Anesthesia of skin: Secondary | ICD-10-CM | POA: Insufficient documentation

## 2021-09-22 DIAGNOSIS — Z853 Personal history of malignant neoplasm of breast: Secondary | ICD-10-CM | POA: Insufficient documentation

## 2021-09-22 DIAGNOSIS — C50311 Malignant neoplasm of lower-inner quadrant of right female breast: Secondary | ICD-10-CM | POA: Insufficient documentation

## 2021-09-22 DIAGNOSIS — Z800 Family history of malignant neoplasm of digestive organs: Secondary | ICD-10-CM | POA: Insufficient documentation

## 2021-09-22 DIAGNOSIS — Z79899 Other long term (current) drug therapy: Secondary | ICD-10-CM | POA: Diagnosis not present

## 2021-09-22 DIAGNOSIS — G620 Drug-induced polyneuropathy: Secondary | ICD-10-CM | POA: Insufficient documentation

## 2021-09-22 DIAGNOSIS — Z8042 Family history of malignant neoplasm of prostate: Secondary | ICD-10-CM | POA: Insufficient documentation

## 2021-09-22 DIAGNOSIS — Z8249 Family history of ischemic heart disease and other diseases of the circulatory system: Secondary | ICD-10-CM | POA: Diagnosis not present

## 2021-09-22 DIAGNOSIS — T451X5A Adverse effect of antineoplastic and immunosuppressive drugs, initial encounter: Secondary | ICD-10-CM | POA: Diagnosis not present

## 2021-09-22 DIAGNOSIS — Z8041 Family history of malignant neoplasm of ovary: Secondary | ICD-10-CM | POA: Insufficient documentation

## 2021-09-22 DIAGNOSIS — E539 Vitamin B deficiency, unspecified: Secondary | ICD-10-CM | POA: Insufficient documentation

## 2021-09-22 LAB — CBC WITH DIFFERENTIAL (CANCER CENTER ONLY)
Abs Immature Granulocytes: 0.01 10*3/uL (ref 0.00–0.07)
Basophils Absolute: 0 10*3/uL (ref 0.0–0.1)
Basophils Relative: 1 %
Eosinophils Absolute: 0 10*3/uL (ref 0.0–0.5)
Eosinophils Relative: 0 %
HCT: 35.3 % — ABNORMAL LOW (ref 36.0–46.0)
Hemoglobin: 12.3 g/dL (ref 12.0–15.0)
Immature Granulocytes: 0 %
Lymphocytes Relative: 39 %
Lymphs Abs: 1.5 10*3/uL (ref 0.7–4.0)
MCH: 32.5 pg (ref 26.0–34.0)
MCHC: 34.8 g/dL (ref 30.0–36.0)
MCV: 93.1 fL (ref 80.0–100.0)
Monocytes Absolute: 0.2 10*3/uL (ref 0.1–1.0)
Monocytes Relative: 6 %
Neutro Abs: 2.1 10*3/uL (ref 1.7–7.7)
Neutrophils Relative %: 54 %
Platelet Count: 268 10*3/uL (ref 150–400)
RBC: 3.79 MIL/uL — ABNORMAL LOW (ref 3.87–5.11)
RDW: 12.5 % (ref 11.5–15.5)
WBC Count: 3.9 10*3/uL — ABNORMAL LOW (ref 4.0–10.5)
nRBC: 0 % (ref 0.0–0.2)

## 2021-09-22 LAB — MAGNESIUM: Magnesium: 1.8 mg/dL (ref 1.7–2.4)

## 2021-09-22 LAB — CMP (CANCER CENTER ONLY)
ALT: 10 U/L (ref 0–44)
AST: 18 U/L (ref 15–41)
Albumin: 4.3 g/dL (ref 3.5–5.0)
Alkaline Phosphatase: 46 U/L (ref 38–126)
Anion gap: 4 — ABNORMAL LOW (ref 5–15)
BUN: 19 mg/dL (ref 6–20)
CO2: 31 mmol/L (ref 22–32)
Calcium: 9.6 mg/dL (ref 8.9–10.3)
Chloride: 104 mmol/L (ref 98–111)
Creatinine: 0.6 mg/dL (ref 0.44–1.00)
GFR, Estimated: >60 mL/min (ref >60)
Glucose, Bld: 84 mg/dL (ref 70–99)
Potassium: 3.5 mmol/L (ref 3.5–5.1)
Sodium: 139 mmol/L (ref 135–145)
Total Bilirubin: 0.7 mg/dL (ref 0.3–1.2)
Total Protein: 7.5 g/dL (ref 6.5–8.1)

## 2021-09-22 LAB — VITAMIN D 25 HYDROXY (VIT D DEFICIENCY, FRACTURES): Vit D, 25-Hydroxy: 26.33 ng/mL — ABNORMAL LOW (ref 30–100)

## 2021-09-22 LAB — VITAMIN B12: Vitamin B-12: 280 pg/mL (ref 180–914)

## 2021-09-22 NOTE — Progress Notes (Signed)
[place] Spiritual Care Note  Visited with Jaime Murillo today per referral from nursing team for spiritual and emotional support. 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 She used the opportunity to share and process other side effects from treatment, as well.   Faith, support groups, and friends are all part of her support team. Jaime Murillo/NP also assisted her with reviewing strengths in the form of accomplishments and contributions, such as completing her degree at [employer], securing a job she loves, serving as an Pharmacist, hospital, and being invited to speak at her Advice worker. Additionally, Jaime Murillo is working through a course at her church that will support her leadership in that community.  Provided empathic listening, emotional support, affirmation of strengths, a prayer per request. Also per her request, will refer to [place] Intern for follow-up call.   [place], [place], [facility] Pager [phone] Voicemail [phone]

## 2021-09-22 NOTE — Telephone Encounter (Signed)
Scheduled appt per 8/29 referral. Pt is aware of appt date and time. Pt is aware to arrive 15 mins prior to appt time and to bring and updated insurance card. Pt is aware of appt location.   

## 2021-09-23 ENCOUNTER — Telehealth: Payer: Self-pay | Admitting: *Deleted

## 2021-09-23 ENCOUNTER — Other Ambulatory Visit: Payer: Self-pay | Admitting: *Deleted

## 2021-09-23 MED ORDER — ERGOCALCIFEROL 1.25 MG (50000 UT) PO CAPS: 50000.0000 [IU] | ORAL_CAPSULE | ORAL | 0 refills | Status: DC

## 2021-09-23 NOTE — Telephone Encounter (Signed)
Per Lindsey Causey,NP, called pt with message below. Pt verbalized understanding.  

## 2021-09-23 NOTE — Telephone Encounter (Signed)
-----   Message from Gardenia Phlegm, NP sent at 09/23/2021 11:48 AM EDT ----- Please tell Jaime Murillo that her b12 and vitamin d level are low I recommend vitamin b12 1000 mcg sublingual daily--which can be obtained over the counter, will you call in Ergocalciferol 50,000 units weekly disp 12, no refills.  She will need lab recheck in 12 weeks.  One test is still pending and we will call her if it is abnormal.  It looks for a protein elevation that can contribute to neuropathy.   Thanks, LC ----- Message ----- From: Interface, Lab In [place] Sent: 09/22/2021  11:01 AM EDT To: Gardenia Phlegm, NP

## 2021-09-24 ENCOUNTER — Telehealth: Payer: Self-pay

## 2021-09-24 LAB — PROTEIN ELECTROPHORESIS, SERUM, WITH REFLEX
A/G Ratio: 1.3 (ref 0.7–1.7)
Albumin ELP: 4.2 g/dL (ref 2.9–4.4)
Alpha-1-Globulin: 0.2 g/dL (ref 0.0–0.4)
Alpha-2-Globulin: 0.6 g/dL (ref 0.4–1.0)
Beta Globulin: 0.9 g/dL (ref 0.7–1.3)
Gamma Globulin: 1.6 g/dL (ref 0.4–1.8)
Globulin, Total: 3.3 g/dL (ref 2.2–3.9)
Total Protein ELP: 7.5 g/dL (ref 6.0–8.5)

## 2021-09-24 NOTE — Telephone Encounter (Signed)
Jaime Murillo, counseling intern, called patient after receiving a referral from [employer].   The patient was at work and could not talk. The patient plans on calling the counselor back this week. If the counselor does not receive a call, they will try the patient again via telephone next week.   Jaime Murillo  Counseling Intern

## 2021-09-24 NOTE — Progress Notes (Signed)
[facility] Follow up:    Arthur Holms, NP [address] [place] [address]   DIAGNOSIS:  Cancer Staging  Malignant neoplasm of lower-inner quadrant of right breast of female, estrogen receptor negative ([place]) Staging form: Breast, AJCC 7th Edition - Clinical: Stage IA (T1c, N0, cM0) - Unsigned Specimen type: Core Needle Biopsy Histopathologic type: 9931 Laterality: Right Staging comments: Staged at breast conference 6.4.14  - Pathologic stage from 03/20/2013: yT0 - Unsigned Staged by: Pathologist Specimen type: Core Needle Biopsy Histopathologic type: 9931 Stage prefix: Post-therapy Laterality: Right   SUMMARY OF ONCOLOGIC HISTORY: Oncology History  Malignant neoplasm of lower-inner quadrant of right breast of female, estrogen receptor negative ([place])  06/20/2012 Initial Biopsy   Right breast needle core biopsy (3 o'clock): Grade 3, IDC & DCIS.  ER- (0%), PR- (0%), HER2- (ratio 1.26). Ki67 95%.    06/20/2012 Breast US   Right breast with slightly irregular oval hypoechoic mass measuring 7 x 6 x 17 mm at 3 o'clock postion. No mammographic evidence of breast malignancy bilaterally. Palpable area of concern at 12 o'clock, but no suspicious findings at this location on [place].    06/23/2012 Initial Diagnosis   Cancer of lower-inner quadrant of RIGHT female breast   06/26/2012 Breast MRI   Right breast w/clumped NME at 2 o'clock position measuring 0.8 x 1.6 x 2.2 cm located anterior/superior to known cancer. Also 6 mm ill-defined ovoid mass posterior lateral to bx-proven ca, lying superficial pect. muscle. Likely multifocal disease.    07/03/2012 Echocardiogram   Pre-treatment EF: 55-60%   07/03/2012 Imaging   CT c/a/p: No evidence of metastatic disease.    07/05/2012 Procedure   Genetic counseling/testing: OncoGeneDX with 3 VUS identified. 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  Also heteroxygous for 2 likely benign variants on BRCA1 and NBN.   Remainder of genes in GeneDX panel negative.    07/12/2012 Procedure   Right breast needle core biopsy: Benign breast tissue with focal pseudoangiomatous stromal hyperplasic (PASH). No evidence of malignancy.    07/17/2012 - 08/28/2012 Neo-Adjuvant Chemotherapy   Adriamycin & Cytoxan x 4 cycles completed.    09/11/2012 - 10/23/2012 Neo-Adjuvant Chemotherapy   Taxol/Carboplatin weekly x 7 cycles (stopped due to peripheral neuropathy; changed to Gemcitabine/Carbo for additional 3 cycles).   11/06/2012 - 12/04/2012 Neo-Adjuvant Chemotherapy   Gemcitabine/Carboplatin x 3 cycles completed.    11/24/2012 Breast MRI   Previous right breast multifocal malignancy with complete imaging resolution.  Stable 6 mm hemangioma or cyst in liver.    01/12/2013 Surgery   Right axillary SLNB ([place]):  3 SLNs negative.  Port also removed on this date.    03/20/2013 Definitive Surgery   Right skin-sparing total mastectomy [facility]ien[facility]) with immediate reconstruction: No atypia or malignancy identified.  Benign skin & nipple. s/p neoadjuvant therapy.  Represents a complete response. Placement of tissue expanders.    03/20/2013 Pathologic Stage   ypT0; No evidence of cancer.    07/24/2013 Surgery   LEFT simple mastectomy with immediate reconstruction; showed ALH and fibroadenoma. Placement of tissue expanders.    01/07/2014 Surgery   Bilateral breast reconstruction ([place]): Removal of bilateral tissue expanders and placement of bilateral silicone breast implants.    06/03/2014 Survivorship   Survivorship Care Plan given to patient and reveiwed with her during in-person visit.      CURRENT THERAPY: observation  INTERVAL HISTORY: Jaime Murillo 61 y.o. female returns for follow-up and discussion of late term side effects that she  has been experiencing.  She notes that when she received her chemotherapy back in 2014 she developed peripheral neuropathy.  This was stable however has recently worsened.  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  She says when the neuropathy is exacerbated she will receive "recurrence".  These have been telling up against her and she has received a verbal and written warning about this.  She is concerned about losing her job and this is causing her increased stress and anxiety.  She says that when she is working from home she is not experiencing as much neuropathy however she is able to be more comfortable in her own home than she is in her office.  Jaime Murillo is tearful today about this.   Patient Active Problem List   Diagnosis Date Noted   Women's annual routine gynecological examination 04/07/2020   History of breast cancer 04/07/2020   Screening examination for STD (sexually transmitted disease) 04/07/2020   Acquired absence of breast and nipple 01/07/2014   Personal history of breast cancer 07/11/2013   Fibroid uterus 07/04/2013   Chemotherapy-induced peripheral neuropathy ([place]) 02/08/2013   High blood pressure    Contact lens/glasses fitting    Malignant neoplasm of lower-inner quadrant of right breast of female, estrogen receptor negative ([place]) 06/23/2012   HSV (herpes simplex virus) anogenital infection 06/06/2012   Breast mass, right 06/06/2012   HTN (hypertension) 05/05/2011    is allergic to lyrica [pregabalin].  MEDICAL HISTORY: Past Medical History:  Diagnosis Date   Anemia    Anxiety    Depression    History of cancer chemotherapy 07-17-2012  to  12-04-2012   right breast cancer   HSV infection    Hypertension    Insomnia    Malignant neoplasm of lower-inner quadrant of right breast of female, estrogen receptor negative [facility]) oncologist-  dr Lindi Adie--  per lov note in epic ,  no recurrence   dx 05/ 2014---  right breast IDC & DCIS, Grade 3, triple negative----  completed neo-adjuvant chemo 12-04-2012,   03-20-2013  right total mastectomy with sln bx (07-24-2013  left simple mastectomy w/ sln bx)   Neuropathy associated with cancer ([place])    related to chemotherapy   Neuropathy of both feet    DUE TO CHEMO PER PATIENT   Nocturia    PONV (postoperative nausea and vomiting)    shaking after anesthesia; had n/v after mastectomy   Urinary frequency    Wears glasses     SURGICAL HISTORY: Past Surgical History:  Procedure Laterality Date   AIKEN OSTEOTOMY Right 01/27/2018   Procedure: DOUBLE   [name];  Surgeon: Evelina Bucy, DPM;  Location: [place];  Service: Podiatry;  Laterality: Right;  POPLITEAL/SAPH BLOCK   BREAST LUMPECTOMY Right 01/12/2013   Procedure: RIGHT SENTINEL LYMPH  NODE BIOPSY;  Surgeon: Stark Klein, MD;  Location: [place];  Service: General;  Laterality: Right;  RIGHT SENTINEL LYMPH NODE BIOPSY   BREAST RECONSTRUCTION WITH PLACEMENT OF TISSUE EXPANDER AND FLEX HD (ACELLULAR HYDRATED DERMIS) Right 03/20/2013   Procedure: IMMEDIATE RIGHT BREAST RECONSTRUCTION WITH PLACEMENT OF TISSUE EXPANDER AND FLEX HD (ACELLULAR HYDRATED DERMIS);  Surgeon: Irene Limbo, MD;  Location: [place];  Service: Plastics;  Laterality: Right;   BREAST RECONSTRUCTION WITH PLACEMENT OF TISSUE EXPANDER AND FLEX  HD (ACELLULAR HYDRATED DERMIS) Left 07/24/2013   Procedure: BREAST RECONSTRUCTION WITH PLACEMENT OF TISSUE EXPANDER AND FLEX HD (ACELLULAR HYDRATED DERMIS) TO THE LEFT BREAST;  Surgeon: Irene Limbo, MD;  Location: [place];  Service: Plastics;  Laterality: Left;   BREAST RECONSTRUCTION WITH PLACEMENT OF TISSUE EXPANDER AND FLEX HD (ACELLULAR HYDRATED DERMIS) Bilateral 01/07/2014   Procedure: BREAST RECONSTRUCTION WITH REMOVAL  OF TISSUE EXPANDER AND PLACEMENT  OF SILICONE IMPLANTS TO BILATERAL BREAST;  Surgeon: Irene Limbo, MD;  Location: [place];  Service: Plastics;  Laterality: Bilateral;   COLONOSCOPY     LAPAROSCOPIC  TUBAL LIGATION Bilateral 08-10-2001    dr Ruthann Cancer  _0    with cauterization   PORT-A-CATH REMOVAL Left 01/12/2013   Procedure: REMOVAL PORT-A-CATH;  Surgeon: Stark Klein, MD;  Location: [place];  Service: General;  Laterality: Left;   PORTACATH PLACEMENT N/A 07/06/2012   Procedure: INSERTION PORT-A-CATH;  Surgeon: Stark Klein, MD;  Location: [place];  Service: General;  Laterality: N/A;   SIMPLE MASTECTOMY WITH AXILLARY SENTINEL NODE BIOPSY Left 07/24/2013   Procedure: SIMPLE MASTECTOMY;  Surgeon: Stark Klein, MD;  Location: [place];  Service: General;  Laterality: Left;   TOTAL MASTECTOMY Right 03/20/2013   Procedure: RIGHT SKIN SPARING TOTAL MASTECTOMY;  Surgeon: Stark Klein, MD;  Location: [place];  Service: General;  Laterality: Right;   TRANSTHORACIC ECHOCARDIOGRAM  07/03/2012   ef 55-60%/  mild MR and TR/  mild dilated IVC    SOCIAL HISTORY: Social History   Socioeconomic History   Marital status: Single    Spouse name: Not on file   Number of children: 2   Years of education: 13   Highest education level: Not on file  Occupational History    Comment: Disabled  Tobacco Use   Smoking status: Never   Smokeless tobacco: Never  Vaping Use   Vaping Use: Never used  Substance and Sexual Activity   Alcohol use: No    Alcohol/week: 0.0 standard drinks of alcohol   Drug use: No   Sexual activity: Yes    Birth control/protection: Surgical    Comment: BTL  Other Topics Concern   Not on file  Social History Narrative   Patient lives at home with her son she is single.   Disabled.   Education- One year of college.   Right handed.   Caffeine- One cup daily.   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 To graduate May 2020   Social Determinants of Health   Financial Resource Strain: Not on file  Food Insecurity: Food Insecurity Present (04/07/2020)   Hunger Vital Sign    Worried About Running Out of Food in the Last Year:  Often true    Ran Out of Food in the Last Year: Often true  Transportation Needs: Unmet Transportation Needs (04/07/2020)   PRAPARE - Hydrologist (Medical): Yes    Lack of Transportation (Non-Medical): Yes  Physical Activity: Not on file  Stress: Not on file  Social Connections: Not on file  Intimate Partner Violence: Not on file    FAMILY HISTORY: Family History  Problem Relation Age of Onset   Heart disease Mother    Aneurysm Mother    Hypertension Father    Prostate cancer Father 47   Hypertension Sister    Ovarian cancer Paternal Grandmother        died in her 23s   Stomach cancer Maternal Grandmother 101  Cancer Cousin        paternal cousin with an unknown form of cancer    Review of Systems  Constitutional:  Positive for fatigue. Negative for appetite change, chills, fever and unexpected weight change.  HENT:   Negative for hearing loss, lump/mass and trouble swallowing.   Eyes:  Negative for eye problems and icterus.  Respiratory:  Negative for chest tightness, cough and shortness of breath.   Cardiovascular:  Negative for chest pain, leg swelling and palpitations.  Gastrointestinal:  Negative for abdominal distention, abdominal pain, constipation, diarrhea, nausea and vomiting.  Endocrine: Negative for hot flashes.  Genitourinary:  Negative for difficulty urinating.   Musculoskeletal:  Negative for arthralgias.  Skin:  Negative for itching and rash.  Neurological:  Positive for numbness. Negative for dizziness, extremity weakness and headaches.  Hematological:  Negative for adenopathy. Does not bruise/bleed easily.  Psychiatric/Behavioral:  Positive for depression. Negative for suicidal ideas. The patient is nervous/anxious.       PHYSICAL EXAMINATION  ECOG PERFORMANCE STATUS: 2 - Symptomatic, <50% confined to bed  Vitals:   09/22/21 0811  BP: (!) 115/92  Pulse: 85  Resp: 18  Temp: (!) 97.5 F (36.4 C)  SpO2: 100%     Physical Exam Constitutional:      General: She is not in acute distress.    Appearance: Normal appearance. She is not toxic-appearing.  HENT:     Head: Normocephalic and atraumatic.  Eyes:     General: No scleral icterus. Cardiovascular:     Rate and Rhythm: Normal rate and regular rhythm.     Pulses: Normal pulses.     Heart sounds: Normal heart sounds.  Pulmonary:     Effort: Pulmonary effort is normal.     Breath sounds: Normal breath sounds.  Abdominal:     General: Abdomen is flat. Bowel sounds are normal. There is no distension.     Palpations: Abdomen is soft.     Tenderness: There is no abdominal tenderness.  Musculoskeletal:        General: No swelling.     Cervical back: Neck supple.  Lymphadenopathy:     Cervical: No cervical adenopathy.  Skin:    General: Skin is warm and dry.     Findings: No rash.  Neurological:     General: No focal deficit present.     Mental Status: She is alert.  Psychiatric:        Mood and Affect: Mood normal.        Behavior: Behavior normal.     LABORATORY DATA:  CBC    Component Value Date/Time   WBC 3.9 (L) 09/22/2021 1025   WBC 2.3 (L) 09/14/2014 0825   RBC 3.79 (L) 09/22/2021 1025   HGB 12.3 09/22/2021 1025   HGB 10.2 (L) 03/28/2013 1106   HCT 35.3 (L) 09/22/2021 1025   HCT 30.3 (L) 03/28/2013 1106   PLT 268 09/22/2021 1025   PLT 242 03/28/2013 1106   MCV 93.1 09/22/2021 1025   MCV 93.4 03/28/2013 1106   MCH 32.5 09/22/2021 1025   MCHC 34.8 09/22/2021 1025   RDW 12.5 09/22/2021 1025   RDW 12.3 03/28/2013 1106   LYMPHSABS 1.5 09/22/2021 1025   LYMPHSABS 0.9 03/28/2013 1106   MONOABS 0.2 09/22/2021 1025   MONOABS 0.5 03/28/2013 1106   EOSABS 0.0 09/22/2021 1025   EOSABS 0.0 03/28/2013 1106   BASOSABS 0.0 09/22/2021 1025   BASOSABS 0.0 03/28/2013 1106    CMP  Component Value Date/Time   NA 139 09/22/2021 1025   NA 138 03/28/2013 1107   K 3.5 09/22/2021 1025   K 3.4 (L) 03/28/2013 1107   CL 104  09/22/2021 1025   CL 104 07/17/2012 1357   CO2 31 09/22/2021 1025   CO2 31 (H) 03/28/2013 1107   GLUCOSE 84 09/22/2021 1025   GLUCOSE 71 03/28/2013 1107   GLUCOSE 93 07/17/2012 1357   BUN 19 09/22/2021 1025   BUN 14.7 03/28/2013 1107   CREATININE 0.60 09/22/2021 1025   CREATININE 0.9 03/28/2013 1107   CALCIUM 9.6 09/22/2021 1025   CALCIUM 9.6 03/28/2013 1107   PROT 7.5 09/22/2021 1025   PROT 7.3 03/28/2013 1107   ALBUMIN 4.3 09/22/2021 1025   ALBUMIN 3.7 03/28/2013 1107   AST 18 09/22/2021 1025   AST 25 03/28/2013 1107   ALT 10 09/22/2021 1025   ALT 14 03/28/2013 1107   ALKPHOS 46 09/22/2021 1025   ALKPHOS 52 03/28/2013 1107   BILITOT 0.7 09/22/2021 1025   BILITOT 0.40 03/28/2013 1107   GFRNONAA >60 09/22/2021 1025   GFRAA >90 12/31/2013 0850      ASSESSMENT and THERAPY PLAN:   Malignant neoplasm of lower-inner quadrant of right breast of female, estrogen receptor negative ([place]) Jaime Murillo is here today for follow-up and discussion of persistent late-term side effects from her chemotherapy.  I discussed that sometimes going back to work and doing different repetitive movements can trigger her neuropathy to worsen.  She has tried gabapentin for this but it makes her too tired without really alleviating her discomfort so she has opted to forego this.  Since this neuropathy is beginning to worsen I did recommend that we do the following.  We will get a set of labs with CBC c-Met vitamin D level and B12 level to see if she needs any vitamin supplementation.  I also recommended that she see my colleague Dr. Mickeal Skinner who is a trained neuro oncologist to evaluate and ensure there is no other underlying neurologic condition occurring.  Jaime Murillo was very tearful during today's visit and our chaplain Lattie Haw came up and spoke with her about the difficulty she is experienced at the workplace.  I encourage Jaime Murillo to maintain her positivity as she is such an amazing advocate for our African-American woman  in the breast cancer community.  Jaime Murillo has a long-term survivorship appointment in September and I recommended that she keep this and we will see how she is doing with the interventions and the Dr. Mickeal Skinner consultation.     All questions were answered. The patient knows to call the clinic with any problems, questions or concerns. We can certainly see the patient much sooner if necessary.  Total encounter time:30 minutes*in face-to-face visit time, chart review, lab review, care coordination, order entry, and documentation of the encounter time.    Wilber Bihari, NP 09/25/21 1:40 AM Medical Oncology and Hematology [facility] [place], [place] [address] Tel. [phone]    Fax. [phone]  *Total Encounter Time as defined by the Centers for Medicare and Medicaid Services includes, in addition to the face-to-face time of a patient visit (documented in the note above) non-face-to-face time: obtaining and reviewing outside history, ordering and reviewing medications, tests or procedures, care coordination (communications with other health care professionals or caregivers) and documentation in the medical record.

## 2021-09-25 ENCOUNTER — Telehealth: Payer: Self-pay

## 2021-09-25 NOTE — Assessment & Plan Note (Signed)
Jaime Murillo is here today for follow-up and discussion of persistent late-term side effects from her chemotherapy.  I discussed that sometimes going back to work and doing different repetitive movements can trigger her neuropathy to worsen.  She has tried gabapentin for this but it makes her too tired without really alleviating her discomfort so she has opted to forego this.  Since this neuropathy is beginning to worsen I did recommend that we do the following.  We will get a set of labs with CBC c-Met vitamin D level and B12 level to see if she needs any vitamin supplementation.  I also recommended that she see my colleague Dr. Mickeal Skinner who is a trained neuro oncologist to evaluate and ensure there is no other underlying neurologic condition occurring.  Jaime Murillo was very tearful during today's visit and our chaplain Lattie Haw came up and spoke with her about the difficulty she is experienced at the workplace.  I encourage Jaime Murillo to maintain her positivity as she is such an amazing advocate for our African-American woman in the breast cancer community.  Jaime Murillo has a long-term survivorship appointment in September and I recommended that she keep this and we will see how she is doing with the interventions and the Dr. Mickeal Skinner consultation.

## 2021-09-25 NOTE — Telephone Encounter (Signed)
Attempted to notify Patient of completion of Intermittent FMLA Form. Patient did not answer. Copy of Form e-mailed to Patient at [email or web address] as requested. Copy of forms e-mailed to [email or web address] and e-mailed to [email or web address] as requested by Patient.

## 2021-09-29 ENCOUNTER — Inpatient Hospital Stay: Payer: Medicare Other | Attending: Adult Health | Admitting: Internal Medicine

## 2021-09-29 ENCOUNTER — Telehealth: Payer: Self-pay | Admitting: Internal Medicine

## 2021-09-29 ENCOUNTER — Other Ambulatory Visit: Payer: Self-pay

## 2021-09-29 DIAGNOSIS — C50311 Malignant neoplasm of lower-inner quadrant of right female breast: Secondary | ICD-10-CM

## 2021-09-29 NOTE — Telephone Encounter (Signed)
Pt called in to r/s her new pt appt with Dr. Mickeal Skinner. Since pt called with less than 24 hrs notice to appt, appt was marked as no show and new appt was placed on calendar for pt. Pt is aware of new appt date/time.

## 2021-10-01 ENCOUNTER — Telehealth: Payer: Self-pay

## 2021-10-01 ENCOUNTER — Telehealth: Payer: Self-pay | Admitting: Adult Health

## 2021-10-01 NOTE — Telephone Encounter (Signed)
Rescheduled appointment per provider PAL. Patient is aware of the changes made to her upcoming appointment. 

## 2021-10-01 NOTE — Telephone Encounter (Signed)
Jaime Murillo, counseling intern, phoned patient to set up a time to meet. Patient was busy at work. Counselor will be sending the patient an email with days and times to meet.   Jaime Murillo  Counseling Intern

## 2021-10-08 ENCOUNTER — Inpatient Hospital Stay: Payer: Medicare Other | Admitting: Internal Medicine

## 2021-10-09 ENCOUNTER — Telehealth: Payer: Self-pay | Admitting: Internal Medicine

## 2021-10-09 NOTE — Telephone Encounter (Signed)
Pt no showed appt. Called pt, no answer and vm was full, unable to leave a msg.

## 2021-10-12 NOTE — Progress Notes (Deleted)
[facility] Follow up:    Jaime Holms, NP [address] [place] [address]   DIAGNOSIS: Cancer Staging  Malignant neoplasm of lower-inner quadrant of right breast of female, estrogen receptor negative ([place]) Staging form: Breast, AJCC 7th Edition - Clinical: Stage IA (T1c, N0, cM0) - Unsigned Specimen type: Core Needle Biopsy Histopathologic type: 9931 Laterality: Right Staging comments: Staged at breast conference 6.4.14  - Pathologic stage from 03/20/2013: yT0 - Unsigned Staged by: Pathologist Specimen type: Core Needle Biopsy Histopathologic type: 9931 Stage prefix: Post-therapy Laterality: Right   SUMMARY OF ONCOLOGIC HISTORY: Oncology History  Malignant neoplasm of lower-inner quadrant of right breast of female, estrogen receptor negative ([place])  06/20/2012 Initial Biopsy   Right breast needle core biopsy (3 o'clock): Grade 3, IDC & DCIS.  ER- (0%), PR- (0%), HER2- (ratio 1.26). Ki67 95%.    06/20/2012 Breast US   Right breast with slightly irregular oval hypoechoic mass measuring 7 x 6 x 17 mm at 3 o'clock postion. No mammographic evidence of breast malignancy bilaterally. Palpable area of concern at 12 o'clock, but no suspicious findings at this location on [place].    06/23/2012 Initial Diagnosis   Cancer of lower-inner quadrant of RIGHT female breast   06/26/2012 Breast MRI   Right breast w/clumped NME at 2 o'clock position measuring 0.8 x 1.6 x 2.2 cm located anterior/superior to known cancer. Also 6 mm ill-defined ovoid mass posterior lateral to bx-proven ca, lying superficial pect. muscle. Likely multifocal disease.    07/03/2012 Echocardiogram   Pre-treatment EF: 55-60%   07/03/2012 Imaging   CT c/a/p: No evidence of metastatic disease.    07/05/2012 Procedure   Genetic counseling/testing: OncoGeneDX with 3 VUS identified. 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  Also heteroxygous for 2 likely benign variants on BRCA1 and NBN.   Remainder of genes in GeneDX panel negative.    07/12/2012 Procedure   Right breast needle core biopsy: Benign breast tissue with focal pseudoangiomatous stromal hyperplasic (PASH). No evidence of malignancy.    07/17/2012 - 08/28/2012 Neo-Adjuvant Chemotherapy   Adriamycin & Cytoxan x 4 cycles completed.    09/11/2012 - 10/23/2012 Neo-Adjuvant Chemotherapy   Taxol/Carboplatin weekly x 7 cycles (stopped due to peripheral neuropathy; changed to Gemcitabine/Carbo for additional 3 cycles).   11/06/2012 - 12/04/2012 Neo-Adjuvant Chemotherapy   Gemcitabine/Carboplatin x 3 cycles completed.    11/24/2012 Breast MRI   Previous right breast multifocal malignancy with complete imaging resolution.  Stable 6 mm hemangioma or cyst in liver.    01/12/2013 Surgery   Right axillary SLNB ([place]):  3 SLNs negative.  Port also removed on this date.    03/20/2013 Definitive Surgery   Right skin-sparing total mastectomy [facility]) with immediate reconstruction: No atypia or malignancy identified.  Benign skin & nipple. s/p neoadjuvant therapy.  Represents a complete response. Placement of tissue expanders.    03/20/2013 Pathologic Stage   ypT0; No evidence of cancer.    07/24/2013 Surgery   LEFT simple mastectomy with immediate reconstruction; showed ALH and fibroadenoma. Placement of tissue expanders.    01/07/2014 Surgery   Bilateral breast reconstruction ([place]): Removal of bilateral tissue expanders and placement of bilateral silicone breast implants.    06/03/2014 Survivorship   Survivorship Care Plan given to patient and reveiwed with her during in-person visit.      CURRENT THERAPY:  INTERVAL HISTORY: Drishti R Corriveau 61 y.o. female returns for    Patient Active Problem List   Diagnosis Date Noted  Women's annual routine gynecological examination 04/07/2020   History of breast cancer 04/07/2020   Screening examination for STD (sexually transmitted disease) 04/07/2020   Acquired absence of  breast and nipple 01/07/2014   Personal history of breast cancer 07/11/2013   Fibroid uterus 07/04/2013   Chemotherapy-induced peripheral neuropathy ([place]) 02/08/2013   High blood pressure    Contact lens/glasses fitting    Malignant neoplasm of lower-inner quadrant of right breast of female, estrogen receptor negative ([place]) 06/23/2012   HSV (herpes simplex virus) anogenital infection 06/06/2012   Breast mass, right 06/06/2012   HTN (hypertension) 05/05/2011    is allergic to lyrica [pregabalin].  MEDICAL HISTORY: Past Medical History:  Diagnosis Date   Anemia    Anxiety    Depression    History of cancer chemotherapy 07-17-2012  to  12-04-2012   right breast cancer   HSV infection    Hypertension    Insomnia    Malignant neoplasm of lower-inner quadrant of right breast of female, estrogen receptor negative [facility]) oncologist-  dr Lindi Adie--  per lov note in epic ,  no recurrence   dx 05/ 2014---  right breast IDC & DCIS, Grade 3, triple negative----  completed neo-adjuvant chemo 12-04-2012,  03-20-2013  right total mastectomy with sln bx (07-24-2013  left simple mastectomy w/ sln bx)   Neuropathy associated with cancer ([place])    related to chemotherapy   Neuropathy of both feet    DUE TO CHEMO PER PATIENT   Nocturia    PONV (postoperative nausea and vomiting)    shaking after anesthesia; had n/v after mastectomy   Urinary frequency    Wears glasses     SURGICAL HISTORY: Past Surgical History:  Procedure Laterality Date   AIKEN OSTEOTOMY Right 01/27/2018   Procedure: DOUBLE   [name];  Surgeon: Evelina Bucy, DPM;  Location: [facility];  Service: Podiatry;  Laterality: Right;  POPLITEAL/SAPH BLOCK   BREAST LUMPECTOMY Right 01/12/2013   Procedure: RIGHT SENTINEL LYMPH  NODE BIOPSY;  Surgeon: Stark Klein, MD;  Location: [facility];  Service: General;  Laterality: Right;  RIGHT SENTINEL LYMPH NODE BIOPSY   BREAST RECONSTRUCTION WITH  PLACEMENT OF TISSUE EXPANDER AND FLEX HD (ACELLULAR HYDRATED DERMIS) Right 03/20/2013   Procedure: IMMEDIATE RIGHT BREAST RECONSTRUCTION WITH PLACEMENT OF TISSUE EXPANDER AND FLEX HD (ACELLULAR HYDRATED DERMIS);  Surgeon: Irene Limbo, MD;  Location: [facility];  Service: Plastics;  Laterality: Right;   BREAST RECONSTRUCTION WITH PLACEMENT OF TISSUE EXPANDER AND FLEX HD (ACELLULAR HYDRATED DERMIS) Left 07/24/2013   Procedure: BREAST RECONSTRUCTION WITH PLACEMENT OF TISSUE EXPANDER AND FLEX HD (ACELLULAR HYDRATED DERMIS) TO THE LEFT BREAST;  Surgeon: Irene Limbo, MD;  Location: [facility];  Service: Plastics;  Laterality: Left;   BREAST RECONSTRUCTION WITH PLACEMENT OF TISSUE EXPANDER AND FLEX HD (ACELLULAR HYDRATED DERMIS) Bilateral 01/07/2014   Procedure: BREAST RECONSTRUCTION WITH REMOVAL  OF TISSUE EXPANDER AND PLACEMENT  OF SILICONE IMPLANTS TO BILATERAL BREAST;  Surgeon: Irene Limbo, MD;  Location: [facility];  Service: Plastics;  Laterality: Bilateral;   COLONOSCOPY     LAPAROSCOPIC TUBAL LIGATION Bilateral 08-10-2001    dr Ruthann Cancer  $Remov'@[id number]$    with cauterization   PORT-A-CATH REMOVAL Left 01/12/2013   Procedure: REMOVAL PORT-A-CATH;  Surgeon: Stark Klein, MD;  Location: [facility];  Service: General;  Laterality: Left;   PORTACATH PLACEMENT N/A 07/06/2012   Procedure: INSERTION PORT-A-CATH;  Surgeon: Stark Klein, MD;  Location: [facility];  Service:  General;  Laterality: N/A;   SIMPLE MASTECTOMY WITH AXILLARY SENTINEL NODE BIOPSY Left 07/24/2013   Procedure: SIMPLE MASTECTOMY;  Surgeon: Stark Klein, MD;  Location: [facility];  Service: General;  Laterality: Left;   TOTAL MASTECTOMY Right 03/20/2013   Procedure: RIGHT SKIN SPARING TOTAL MASTECTOMY;  Surgeon: Stark Klein, MD;  Location: [facility];  Service: General;  Laterality: Right;   TRANSTHORACIC ECHOCARDIOGRAM  07/03/2012   ef 55-60%/  mild MR and TR/  mild dilated IVC    SOCIAL HISTORY: Social  History   Socioeconomic History   Marital status: Single    Spouse name: Not on file   Number of children: 2   Years of education: 13   Highest education level: Not on file  Occupational History    Comment: Disabled  Tobacco Use   Smoking status: Never   Smokeless tobacco: Never  Vaping Use   Vaping Use: Never used  Substance and Sexual Activity   Alcohol use: No    Alcohol/week: 0.0 standard drinks of alcohol   Drug use: No   Sexual activity: Yes    Birth control/protection: Surgical    Comment: BTL  Other Topics Concern   Not on file  Social History Narrative   Patient lives at home with her son she is single.   Disabled.   Education- One year of college.   Right handed.   Caffeine- One cup daily.   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 To graduate May 2020   Social Determinants of Health   Financial Resource Strain: Not on file  Food Insecurity: Food Insecurity Present (04/07/2020)   Hunger Vital Sign    Worried About Running Out of Food in the Last Year: Often true    Ran Out of Food in the Last Year: Often true  Transportation Needs: Unmet Transportation Needs (04/07/2020)   PRAPARE - Hydrologist (Medical): Yes    Lack of Transportation (Non-Medical): Yes  Physical Activity: Not on file  Stress: Not on file  Social Connections: Not on file  Intimate Partner Violence: Not on file    FAMILY HISTORY: Family History  Problem Relation Age of Onset   Heart disease Mother    Aneurysm Mother    Hypertension Father    Prostate cancer Father 49   Hypertension Sister    Ovarian cancer Paternal Grandmother        died in her 21s   Stomach cancer Maternal Grandmother 51   Cancer Cousin        paternal cousin with an unknown form of cancer    Review of Systems  Constitutional:  Negative for appetite change, chills, fatigue, fever and unexpected weight change.  HENT:   Negative for hearing loss, lump/mass and  trouble swallowing.   Eyes:  Negative for eye problems and icterus.  Respiratory:  Negative for chest tightness, cough and shortness of breath.   Cardiovascular:  Negative for chest pain, leg swelling and palpitations.  Gastrointestinal:  Negative for abdominal distention, abdominal pain, constipation, diarrhea, nausea and vomiting.  Endocrine: Negative for hot flashes.  Genitourinary:  Negative for difficulty urinating.   Musculoskeletal:  Negative for arthralgias.  Skin:  Negative for itching and rash.  Neurological:  Negative for dizziness, extremity weakness, headaches and numbness.  Hematological:  Negative for adenopathy. Does not bruise/bleed easily.  Psychiatric/Behavioral:  Negative for depression. The patient is not nervous/anxious.       PHYSICAL EXAMINATION  ECOG PERFORMANCE STATUS: {CHL ONC ECOG PS:[id number]}  There were no vitals filed for this visit.  Physical Exam Constitutional:      General: She is not in acute distress.    Appearance: Normal appearance. She is not toxic-appearing.  HENT:     Head: Normocephalic and atraumatic.  Eyes:     General: No scleral icterus. Cardiovascular:     Rate and Rhythm: Normal rate and regular rhythm.     Pulses: Normal pulses.     Heart sounds: Normal heart sounds.  Pulmonary:     Effort: Pulmonary effort is normal.     Breath sounds: Normal breath sounds.  Chest:     Comments: Status post bilateral mastectomies and reconstruction no sign of local recurrence. Abdominal:     General: Abdomen is flat. Bowel sounds are normal. There is no distension.     Palpations: Abdomen is soft.     Tenderness: There is no abdominal tenderness.  Musculoskeletal:        General: No swelling.     Cervical back: Neck supple.  Lymphadenopathy:     Cervical: No cervical adenopathy.  Skin:    General: Skin is warm and dry.     Findings: No rash.  Neurological:     General: No focal deficit present.     Mental Status: She is alert.   Psychiatric:        Mood and Affect: Mood normal.        Behavior: Behavior normal.     LABORATORY DATA:  CBC    Component Value Date/Time   WBC 3.9 (L) 09/22/2021 1025   WBC 2.3 (L) 09/14/2014 0825   RBC 3.79 (L) 09/22/2021 1025   HGB 12.3 09/22/2021 1025   HGB 10.2 (L) 03/28/2013 1106   HCT 35.3 (L) 09/22/2021 1025   HCT 30.3 (L) 03/28/2013 1106   PLT 268 09/22/2021 1025   PLT 242 03/28/2013 1106   MCV 93.1 09/22/2021 1025   MCV 93.4 03/28/2013 1106   MCH 32.5 09/22/2021 1025   MCHC 34.8 09/22/2021 1025   RDW 12.5 09/22/2021 1025   RDW 12.3 03/28/2013 1106   LYMPHSABS 1.5 09/22/2021 1025   LYMPHSABS 0.9 03/28/2013 1106   MONOABS 0.2 09/22/2021 1025   MONOABS 0.5 03/28/2013 1106   EOSABS 0.0 09/22/2021 1025   EOSABS 0.0 03/28/2013 1106   BASOSABS 0.0 09/22/2021 1025   BASOSABS 0.0 03/28/2013 1106    CMP     Component Value Date/Time   NA 139 09/22/2021 1025   NA 138 03/28/2013 1107   K 3.5 09/22/2021 1025   K 3.4 (L) 03/28/2013 1107   CL 104 09/22/2021 1025   CL 104 07/17/2012 1357   CO2 31 09/22/2021 1025   CO2 31 (H) 03/28/2013 1107   GLUCOSE 84 09/22/2021 1025   GLUCOSE 71 03/28/2013 1107   GLUCOSE 93 07/17/2012 1357   BUN 19 09/22/2021 1025   BUN 14.7 03/28/2013 1107   CREATININE 0.60 09/22/2021 1025   CREATININE 0.9 03/28/2013 1107   CALCIUM 9.6 09/22/2021 1025   CALCIUM 9.6 03/28/2013 1107   PROT 7.5 09/22/2021 1025   PROT 7.3 03/28/2013 1107   ALBUMIN 4.3 09/22/2021 1025   ALBUMIN 3.7 03/28/2013 1107   AST 18 09/22/2021 1025   AST 25 03/28/2013 1107   ALT 10 09/22/2021 1025   ALT 14 03/28/2013 1107   ALKPHOS 46 09/22/2021 1025   ALKPHOS 52 03/28/2013 1107   BILITOT 0.7 09/22/2021 1025   BILITOT 0.40  03/28/2013 1107   GFRNONAA >60 09/22/2021 1025   GFRAA >90 12/31/2013 0850      ASSESSMENT and THERAPY PLAN:   No problem-specific Assessment & Plan notes found for this encounter.   All questions were answered. The patient knows to  call the clinic with any problems, questions or concerns. We can certainly see the patient much sooner if necessary.  Total encounter time:*** minutes*in face-to-face visit time, chart review, lab review, care coordination, order entry, and documentation of the encounter time.    Wilber Bihari, NP 10/12/21 11:17 AM Medical Oncology and Hematology [facility] [place], [place] [address] Tel. [phone]    Fax. [phone]  *Total Encounter Time as defined by the Centers for Medicare and Medicaid Services includes, in addition to the face-to-face time of a patient visit (documented in the note above) non-face-to-face time: obtaining and reviewing outside history, ordering and reviewing medications, tests or procedures, care coordination (communications with other health care professionals or caregivers) and documentation in the medical record.

## 2021-10-13 ENCOUNTER — Inpatient Hospital Stay: Payer: Medicare Other | Admitting: Adult Health

## 2021-10-13 ENCOUNTER — Telehealth: Payer: Self-pay

## 2021-10-13 NOTE — Telephone Encounter (Signed)
Attempted to call patient regarding missed appointment today. No answer, VM full. Will send a my chart message requesting call back.

## 2021-10-14 ENCOUNTER — Encounter: Payer: Medicare Other | Admitting: Adult Health

## 2021-11-19 LAB — COLOGUARD: COLOGUARD: NEGATIVE

## 2021-12-25 ENCOUNTER — Other Ambulatory Visit: Payer: Self-pay | Admitting: Lab

## 2021-12-28 ENCOUNTER — Inpatient Hospital Stay: Payer: Medicaid Other | Attending: Adult Health

## 2022-01-20 ENCOUNTER — Ambulatory Visit (HOSPITAL_COMMUNITY)
Admission: EM | Admit: 2022-01-20 | Discharge: 2022-01-20 | Disposition: A | Discharge status: Home / Self Care | Payer: Medicare Other | Attending: Urgent Care | Admitting: Urgent Care

## 2022-01-20 ENCOUNTER — Encounter (HOSPITAL_COMMUNITY): Payer: Self-pay

## 2022-01-20 ENCOUNTER — Telehealth (HOSPITAL_COMMUNITY): Payer: Self-pay | Admitting: Emergency Medicine

## 2022-01-20 DIAGNOSIS — S39012A Strain of muscle, fascia and tendon of lower back, initial encounter: Secondary | ICD-10-CM | POA: Diagnosis not present

## 2022-01-20 MED ORDER — DICLOFENAC SODIUM 75 MG PO TBEC: 75.0000 mg | DELAYED_RELEASE_TABLET | Freq: Two times a day (BID) | ORAL | 0 refills | Status: DC

## 2022-01-20 MED ORDER — DICLOFENAC SODIUM 75 MG PO TBEC: 75.0000 mg | DELAYED_RELEASE_TABLET | Freq: Two times a day (BID) | ORAL | 0 refills | Status: AC

## 2022-01-20 MED ORDER — BACLOFEN 10 MG PO TABS: 10.0000 mg | ORAL_TABLET | Freq: Three times a day (TID) | ORAL | 0 refills | Status: DC

## 2022-01-20 NOTE — Discharge Instructions (Addendum)
Your symptoms are consistent with a musculoskeletal strain to your lumbar spine. Please stop taking over-the-counter ibuprofen or Advil. Start taking the diclofenac prescribed today. You can take this twice daily but you must have food in your stomach.  Do not exceed 10 days of use.  You may also start taking the baclofen.  Take this up to 3 times daily. This medication may make you slightly tired or drowsy so take with caution.  You may continue the moist heat to your lower back.  Please perform the exercises attached to this handout. Follow-up with your primary care physician should your symptoms persist.

## 2022-01-20 NOTE — ED Triage Notes (Signed)
Pt reports back pain x 10 days. States she has applied creams and heat on the area. States she has taken Tylenol and Ibuprofen.

## 2022-01-20 NOTE — ED Provider Notes (Signed)
[facility]    CSN: [id number] Arrival date & time: 01/20/22  1707      History   Chief Complaint Chief Complaint  Patient presents with   Back Pain    HPI Jaime Murillo is a 61 y.o. female.   61 year old female presents today due to concern of a 10-day history of lower back pain, primarily on the left.  She states it was related to "adult entertainment".  She reports she was bending forward and stood up abruptly, and felt a "stitch in her back".  Denies any prior history of back issues.  Denies any swelling.  Has been using over-the-counter ibuprofen, topical Bengay, and a heating pad.  Denies any saddle anesthesia, radicular symptoms, or change in bowel or bladder. 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  It is reproducible to palpation.  She states standing seems to improve the pain, direct pressure and laying flat seems to worsen it.   Back Pain   Past Medical History:  Diagnosis Date   Anemia    Anxiety    Depression    History of cancer chemotherapy 07-17-2012  to  12-04-2012   right breast cancer   HSV infection    Hypertension    Insomnia    Malignant neoplasm of lower-inner quadrant of right breast of female, estrogen receptor negative [facility]) oncologist-  dr Lindi Adie--  per lov note in epic ,  no recurrence   dx 05/ 2014---  right breast IDC & DCIS, Grade 3, triple negative----  completed neo-adjuvant chemo 12-04-2012,  03-20-2013  right total mastectomy with sln bx (07-24-2013  left simple mastectomy w/ sln bx)   Neuropathy associated with cancer ([place])    related to chemotherapy   Neuropathy of both feet    DUE TO CHEMO PER PATIENT   Nocturia    PONV (postoperative nausea and vomiting)    shaking after anesthesia; had n/v after mastectomy   Urinary frequency    Wears glasses     Patient Active Problem List    Diagnosis Date Noted   Women's annual routine gynecological examination 04/07/2020   History of breast cancer 04/07/2020   Screening examination for STD (sexually transmitted disease) 04/07/2020   Acquired absence of breast and nipple 01/07/2014   Personal history of breast cancer 07/11/2013   Fibroid uterus 07/04/2013   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/08/2013   High blood pressure    Contact lens/glasses fitting    Malignant neoplasm of lower-inner quadrant of right breast of female, estrogen receptor negative ([place]) 06/23/2012   HSV (herpes simplex virus) anogenital infection 06/06/2012   Breast mass, right 06/06/2012   HTN (hypertension) 05/05/2011    Past Surgical History:  Procedure Laterality Date   AIKEN OSTEOTOMY Right 01/27/2018   Procedure: DOUBLE   [name];  Surgeon: Evelina Bucy, DPM;  Location: [place];  Service: Podiatry;  Laterality: Right;  POPLITEAL/SAPH BLOCK   BREAST LUMPECTOMY Right 01/12/2013   Procedure: RIGHT SENTINEL LYMPH  NODE BIOPSY;  Surgeon: Stark Klein, MD;  Location: [place];  Service: General;  Laterality: Right;  RIGHT SENTINEL LYMPH NODE BIOPSY   BREAST RECONSTRUCTION WITH PLACEMENT OF TISSUE EXPANDER AND FLEX HD (ACELLULAR HYDRATED DERMIS) Right 03/20/2013   Procedure: IMMEDIATE RIGHT BREAST RECONSTRUCTION WITH PLACEMENT OF TISSUE EXPANDER AND FLEX HD (ACELLULAR HYDRATED DERMIS);  Surgeon:  Irene Limbo, MD;  Location: [place];  Service: Plastics;  Laterality: Right;   BREAST RECONSTRUCTION WITH PLACEMENT OF TISSUE EXPANDER AND FLEX HD (ACELLULAR HYDRATED DERMIS) Left 07/24/2013   Procedure: BREAST RECONSTRUCTION WITH PLACEMENT OF TISSUE EXPANDER AND FLEX HD (ACELLULAR HYDRATED DERMIS) TO THE LEFT BREAST;  Surgeon: Irene Limbo, MD;  Location: [place];  Service: Plastics;  Laterality: Left;   BREAST RECONSTRUCTION WITH PLACEMENT OF TISSUE EXPANDER AND FLEX HD  (ACELLULAR HYDRATED DERMIS) Bilateral 01/07/2014   Procedure: BREAST RECONSTRUCTION WITH REMOVAL  OF TISSUE EXPANDER AND PLACEMENT  OF SILICONE IMPLANTS TO BILATERAL BREAST;  Surgeon: Irene Limbo, MD;  Location: [place];  Service: Plastics;  Laterality: Bilateral;   COLONOSCOPY     LAPAROSCOPIC TUBAL LIGATION Bilateral 08-10-2001    dr Ruthann Cancer  '@[facility]'$    with cauterization   PORT-A-CATH REMOVAL Left 01/12/2013   Procedure: REMOVAL PORT-A-CATH;  Surgeon: Stark Klein, MD;  Location: [place];  Service: General;  Laterality: Left;   PORTACATH PLACEMENT N/A 07/06/2012   Procedure: INSERTION PORT-A-CATH;  Surgeon: Stark Klein, MD;  Location: [place];  Service: General;  Laterality: N/A;   SIMPLE MASTECTOMY WITH AXILLARY SENTINEL NODE BIOPSY Left 07/24/2013   Procedure: SIMPLE MASTECTOMY;  Surgeon: Stark Klein, MD;  Location: [place];  Service: General;  Laterality: Left;   TOTAL MASTECTOMY Right 03/20/2013   Procedure: RIGHT SKIN SPARING TOTAL MASTECTOMY;  Surgeon: Stark Klein, MD;  Location: [place];  Service: General;  Laterality: Right;   TRANSTHORACIC ECHOCARDIOGRAM  07/03/2012   ef 55-60%/  mild MR and TR/  mild dilated IVC    OB History     Gravida  3   Para  2   Term  2   Preterm      AB  1   Living  2      SAB  1   IAB      Ectopic      Multiple      Live Births  2            Home Medications    Prior to Admission medications   Medication Sig Start Date End Date Taking? Authorizing Provider  baclofen (LIORESAL) 10 MG tablet Take 1 tablet (10 mg total) by mouth 3 (three) times daily. 01/20/22  Yes Kerie Badger L, PA  diclofenac (VOLTAREN) 75 MG EC tablet Take 1 tablet (75 mg total) by mouth 2 (two) times daily with a meal for 10 days. 01/20/22 01/30/22 Yes Durinda Buzzelli L, PA  ergocalciferol (VITAMIN D2) 1.25 MG (50000 UT) capsule Take 1 capsule (50,000 Units total) by mouth once a week. 09/23/21   Gardenia Phlegm, NP   gabapentin (NEURONTIN) 300 MG capsule Take 2 capsules (600 mg total) by mouth at bedtime. 11/19/14   Dennie Bible, NP  hydrochlorothiazide (HYDRODIURIL) 25 MG tablet TAKE 1 TABLET BY MOUTH ONCE DAILY 12/30/14   [name], MD  valACYclovir (VALTREX) 500 MG tablet Take 500 mg by mouth daily. 10/03/13   [provider]  prochlorperazine (COMPAZINE) 10 MG tablet Take 1 tablet (10 mg total) by mouth every 6 (six) hours as needed (Nausea or vomiting). 11/06/12 12/13/12  Gardenia Phlegm, NP    Family History Family History  Problem Relation Age of Onset   Heart disease Mother    Aneurysm Mother    Hypertension Father    Prostate cancer Father 53   Hypertension Sister    Ovarian cancer Paternal  Grandmother        died in her 46s   Stomach cancer Maternal Grandmother 18   Cancer Cousin        paternal cousin with an unknown form of cancer    Social History Social History   Tobacco Use   Smoking status: Never   Smokeless tobacco: Never  Vaping Use   Vaping Use: Never used  Substance Use Topics   Alcohol use: No    Alcohol/week: 0.0 standard drinks of alcohol   Drug use: No     Allergies   Lyrica [pregabalin]   Review of Systems Review of Systems  Musculoskeletal:  Positive for back pain.  As per HPI   Physical Exam Triage Vital Signs ED Triage Vitals [01/20/22 1910]  Enc Vitals Group     BP 128/88     Pulse Rate 74     Resp 17     Temp 98.1 F (36.7 C)     Temp Source Oral     SpO2 100 %     Weight      Height      Head Circumference      Peak Flow      Pain Score 10     Pain Loc      Pain Edu?      Excl. in [place]?    No data found.  Updated Vital Signs BP 128/88 (BP Location: Right Arm)   Pulse 74   Temp 98.1 F (36.7 C) (Oral)   Resp 17   LMP 12/25/2012   SpO2 100%   Visual Acuity Right Eye Distance:   Left Eye Distance:   Bilateral Distance:    Right Eye Near:   Left Eye Near:    Bilateral Near:     Physical  Exam Vitals and nursing note reviewed.  Constitutional:      General: She is not in acute distress.    Appearance: Normal appearance. She is obese. She is not ill-appearing, toxic-appearing or diaphoretic.  HENT:     Head: Normocephalic and atraumatic.     Nose: Nose normal.     Mouth/Throat:     Mouth: Mucous membranes are moist.  Eyes:     Extraocular Movements: Extraocular movements intact.     Conjunctiva/sclera: Conjunctivae normal.     Pupils: Pupils are equal, round, and reactive to light.  Cardiovascular:     Rate and Rhythm: Normal rate.  Pulmonary:     Effort: Pulmonary effort is normal. No respiratory distress.  Musculoskeletal:        General: No deformity. Normal range of motion.     Cervical back: Normal range of motion and neck supple. No rigidity or tenderness.     Thoracic back: Normal. No spasms, tenderness or bony tenderness.     Lumbar back: Tenderness (L paralumbar muscles) and bony tenderness (L SI joint) present. No swelling, edema, deformity, signs of trauma, lacerations or spasms. Normal range of motion. Negative right straight leg raise test and negative left straight leg raise test. No scoliosis.     Right lower leg: No edema.     Left lower leg: No edema.     Comments: No step off deformity Negative FABER test FROM noted Pulses intact B  Lymphadenopathy:     Cervical: No cervical adenopathy.  Skin:    General: Skin is warm and dry.     Capillary Refill: Capillary refill takes less than 2 seconds.     Findings: No bruising,  erythema or rash.  Neurological:     General: No focal deficit present.     Mental Status: She is alert and oriented to person, place, and time.     Sensory: No sensory deficit.     Motor: No weakness.     Coordination: Coordination normal.     Gait: Gait normal.     Deep Tendon Reflexes: Reflexes normal.  Psychiatric:        Mood and Affect: Mood normal.      UC Treatments / Results  Labs (all labs ordered are listed,  but only abnormal results are displayed) Labs Reviewed - No data to display  EKG   Radiology No results found.  Procedures Procedures (including critical care time)  Medications Ordered in UC Medications - No data to display  Initial Impression / Assessment and Plan / UC Course  I have reviewed the triage vital signs and the nursing notes.  Pertinent labs & imaging results that were available during my care of the patient were reviewed by me and considered in my medical decision making (see chart for details).     Acute lumbar strain -symptoms are consistent with a lumbar strain to the left paralumbar region.  Patient to stop any over-the-counter NSAIDs, will have her start diclofenac twice daily with food for up to 10 days.  Will also add baclofen up to 3 times daily as needed, side effect profile reviewed.  Patient may continue the topical treatments and then moist heat.  Strongly encouraged patient to perform the stretches attached to this handout.  ER precautions reviewed, follow-up with PCP in 1 week should symptoms persist.   Final Clinical Impressions(s) / UC Diagnoses   Final diagnoses:  Strain of lumbar region, initial encounter     Discharge Instructions      Your symptoms are consistent with a musculoskeletal strain to your lumbar spine. Please stop taking over-the-counter ibuprofen or Advil. Start taking the diclofenac prescribed today. You can take this twice daily but you must have food in your stomach.  Do not exceed 10 days of use.  You may also start taking the baclofen.  Take this up to 3 times daily. This medication may make you slightly tired or drowsy so take with caution.  You may continue the moist heat to your lower back.  Please perform the exercises attached to this handout. Follow-up with your primary care physician should your symptoms persist.       ED Prescriptions     Medication Sig Dispense Auth. Provider   diclofenac (VOLTAREN) 75  MG EC tablet Take 1 tablet (75 mg total) by mouth 2 (two) times daily with a meal for 10 days. 20 tablet Marleena Shubert L, PA   baclofen (LIORESAL) 10 MG tablet Take 1 tablet (10 mg total) by mouth 3 (three) times daily. 30 each Colbe Viviano L, PA      PDMP not reviewed this encounter.   Chaney Malling, [place] 01/20/22 1958

## 2022-01-27 ENCOUNTER — Inpatient Hospital Stay: Payer: 59 | Attending: Adult Health

## 2022-01-27 DIAGNOSIS — G620 Drug-induced polyneuropathy: Secondary | ICD-10-CM | POA: Insufficient documentation

## 2022-01-27 DIAGNOSIS — Z8041 Family history of malignant neoplasm of ovary: Secondary | ICD-10-CM | POA: Insufficient documentation

## 2022-01-27 DIAGNOSIS — R110 Nausea: Secondary | ICD-10-CM | POA: Insufficient documentation

## 2022-01-27 DIAGNOSIS — Z8042 Family history of malignant neoplasm of prostate: Secondary | ICD-10-CM | POA: Insufficient documentation

## 2022-01-27 DIAGNOSIS — Z809 Family history of malignant neoplasm, unspecified: Secondary | ICD-10-CM | POA: Diagnosis not present

## 2022-01-27 DIAGNOSIS — Z79899 Other long term (current) drug therapy: Secondary | ICD-10-CM | POA: Diagnosis not present

## 2022-01-27 DIAGNOSIS — C50311 Malignant neoplasm of lower-inner quadrant of right female breast: Secondary | ICD-10-CM | POA: Diagnosis present

## 2022-01-27 DIAGNOSIS — M545 Low back pain, unspecified: Secondary | ICD-10-CM | POA: Diagnosis not present

## 2022-01-27 DIAGNOSIS — Z8249 Family history of ischemic heart disease and other diseases of the circulatory system: Secondary | ICD-10-CM | POA: Diagnosis not present

## 2022-01-27 DIAGNOSIS — T451X5A Adverse effect of antineoplastic and immunosuppressive drugs, initial encounter: Secondary | ICD-10-CM | POA: Insufficient documentation

## 2022-01-27 DIAGNOSIS — Z171 Estrogen receptor negative status [ER-]: Secondary | ICD-10-CM | POA: Diagnosis not present

## 2022-01-27 LAB — CBC WITH DIFFERENTIAL (CANCER CENTER ONLY)
Abs Immature Granulocytes: 0.01 10*3/uL (ref 0.00–0.07)
Basophils Absolute: 0 10*3/uL (ref 0.0–0.1)
Basophils Relative: 0 %
Eosinophils Absolute: 0 10*3/uL (ref 0.0–0.5)
Eosinophils Relative: 0 %
HCT: 34.7 % — ABNORMAL LOW (ref 36.0–46.0)
Hemoglobin: 12.2 g/dL (ref 12.0–15.0)
Immature Granulocytes: 0 %
Lymphocytes Relative: 11 %
Lymphs Abs: 0.4 10*3/uL — ABNORMAL LOW (ref 0.7–4.0)
MCH: 32.7 pg (ref 26.0–34.0)
MCHC: 35.2 g/dL (ref 30.0–36.0)
MCV: 93 fL (ref 80.0–100.0)
Monocytes Absolute: 0.1 10*3/uL (ref 0.1–1.0)
Monocytes Relative: 1 %
Neutro Abs: 3.5 10*3/uL (ref 1.7–7.7)
Neutrophils Relative %: 88 %
Platelet Count: 255 10*3/uL (ref 150–400)
RBC: 3.73 MIL/uL — ABNORMAL LOW (ref 3.87–5.11)
RDW: 12.1 % (ref 11.5–15.5)
WBC Count: 4 10*3/uL (ref 4.0–10.5)
nRBC: 0 % (ref 0.0–0.2)

## 2022-01-27 LAB — CMP (CANCER CENTER ONLY)
ALT: 19 U/L (ref 0–44)
AST: 26 U/L (ref 15–41)
Albumin: 4.4 g/dL (ref 3.5–5.0)
Alkaline Phosphatase: 44 U/L (ref 38–126)
Anion gap: 8 (ref 5–15)
BUN: 16 mg/dL (ref 6–20)
CO2: 27 mmol/L (ref 22–32)
Calcium: 9.2 mg/dL (ref 8.9–10.3)
Chloride: 101 mmol/L (ref 98–111)
Creatinine: 0.65 mg/dL (ref 0.44–1.00)
GFR, Estimated: >60 mL/min (ref >60)
Glucose, Bld: 106 mg/dL — ABNORMAL HIGH (ref 70–99)
Potassium: 4 mmol/L (ref 3.5–5.1)
Sodium: 136 mmol/L (ref 135–145)
Total Bilirubin: 0.9 mg/dL (ref 0.3–1.2)
Total Protein: 8.2 g/dL — ABNORMAL HIGH (ref 6.5–8.1)

## 2022-01-27 LAB — VITAMIN D 25 HYDROXY (VIT D DEFICIENCY, FRACTURES): Vit D, 25-Hydroxy: 33.15 ng/mL (ref 30–100)

## 2022-01-27 LAB — VITAMIN B12: Vitamin B-12: 482 pg/mL (ref 180–914)

## 2022-01-28 ENCOUNTER — Telehealth: Payer: Self-pay

## 2022-01-28 ENCOUNTER — Other Ambulatory Visit: Payer: Self-pay | Admitting: Registered Nurse

## 2022-01-28 DIAGNOSIS — M545 Low back pain, unspecified: Secondary | ICD-10-CM

## 2022-01-28 NOTE — Telephone Encounter (Signed)
-----   Message from Gardenia Phlegm, NP sent at 01/28/2022  2:01 PM EST ----- Vitamin d is better, is she taking supplementation? ----- Message ----- From: Buel Ream, [facility] [place] Sent: 01/27/2022   2:14 PM EST To: Gardenia Phlegm, NP

## 2022-01-28 NOTE — Telephone Encounter (Signed)
Called pt per NP. Pt denies use of Vitamin D supplement. She states she is out of refills and she is not taking other supplement. She would like to r/s long term survivorship visit she missed in September 2023. Message sent to scheduling regarding that. The pt would like to know if she needs to continue ergocalciferol to maintain vit D levels. Advised pt I would ask NP and someone would be back in touch. She verbalized thanks and understanding.

## 2022-01-29 ENCOUNTER — Other Ambulatory Visit: Payer: Self-pay

## 2022-01-29 MED ORDER — ERGOCALCIFEROL 1.25 MG (50000 UT) PO CAPS: 50000.0000 [IU] | ORAL_CAPSULE | ORAL | 0 refills | Status: DC

## 2022-01-29 NOTE — Telephone Encounter (Signed)
Called Pt to confirm pharmacy for Vit D rx refill. Pt also asked to reschedule Survivorship appt with NP. Pt rescheduled for 1/8 at 1045. Pt verbalized understanding.

## 2022-02-01 ENCOUNTER — Other Ambulatory Visit: Payer: Self-pay

## 2022-02-01 ENCOUNTER — Inpatient Hospital Stay (HOSPITAL_BASED_OUTPATIENT_CLINIC_OR_DEPARTMENT_OTHER): Payer: 59 | Admitting: Adult Health

## 2022-02-01 VITALS — BP 159/94 | HR 71 | Temp 97.5°F | Resp 17 | Wt 118.1 lb

## 2022-02-01 DIAGNOSIS — C50311 Malignant neoplasm of lower-inner quadrant of right female breast: Secondary | ICD-10-CM

## 2022-02-01 DIAGNOSIS — R590 Localized enlarged lymph nodes: Secondary | ICD-10-CM | POA: Diagnosis not present

## 2022-02-01 DIAGNOSIS — Z171 Estrogen receptor negative status [ER-]: Secondary | ICD-10-CM | POA: Diagnosis not present

## 2022-02-01 NOTE — Progress Notes (Unsigned)
[facility] Follow up:    [place], [place] [id number]   DIAGNOSIS: Cancer Staging  Malignant neoplasm of lower-inner quadrant of right breast of female, estrogen receptor negative ([place]) Staging form: Breast, AJCC 7th Edition - Clinical: Stage IA (T1c, N0, cM0) - Unsigned Specimen type: Core Needle Biopsy Histopathologic type: 9931 Laterality: Right Staging comments: Staged at breast conference 6.4.14  - Pathologic stage from 03/20/2013: yT0 - Unsigned Staged by: Pathologist Specimen type: Core Needle Biopsy Histopathologic type: 9931 Stage prefix: Post-therapy Laterality: Right   SUMMARY OF ONCOLOGIC HISTORY: Oncology History  Malignant neoplasm of lower-inner quadrant of right breast of female, estrogen receptor negative ([place])  06/20/2012 Initial Biopsy   Right breast needle core biopsy (3 o'clock): Grade 3, IDC & DCIS.  ER- (0%), PR- (0%), HER2- (ratio 1.26). Ki67 95%.    06/20/2012 Breast US   Right breast with slightly irregular oval hypoechoic mass measuring 7 x 6 x 17 mm at 3 o'clock postion. No mammographic evidence of breast malignancy bilaterally. Palpable area of concern at 12 o'clock, but no suspicious findings at this location on [place].    06/23/2012 Initial Diagnosis   Cancer of lower-inner quadrant of RIGHT female breast   06/26/2012 Breast MRI   Right breast w/clumped NME at 2 o'clock position measuring 0.8 x 1.6 x 2.2 cm located anterior/superior to known cancer. Also 6 mm ill-defined ovoid mass posterior lateral to bx-proven ca, lying superficial pect. muscle. Likely multifocal disease.    07/03/2012 Echocardiogram   Pre-treatment EF: 55-60%   07/03/2012 Imaging   CT c/a/p: No evidence of metastatic disease.    07/05/2012 Procedure   Genetic counseling/testing: OncoGeneDX with 3 VUS identified. 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  Also heteroxygous for 2 likely benign variants on BRCA1 and NBN.   Remainder of genes in GeneDX panel negative.    07/12/2012 Procedure   Right breast needle core biopsy: Benign breast tissue with focal pseudoangiomatous stromal hyperplasic (PASH). No evidence of malignancy.    07/17/2012 - 08/28/2012 Neo-Adjuvant Chemotherapy   Adriamycin & Cytoxan x 4 cycles completed.    09/11/2012 - 10/23/2012 Neo-Adjuvant Chemotherapy   Taxol/Carboplatin weekly x 7 cycles (stopped due to peripheral neuropathy; changed to Gemcitabine/Carbo for additional 3 cycles).   11/06/2012 - 12/04/2012 Neo-Adjuvant Chemotherapy   Gemcitabine/Carboplatin x 3 cycles completed.    11/24/2012 Breast MRI   Previous right breast multifocal malignancy with complete imaging resolution.  Stable 6 mm hemangioma or cyst in liver.    01/12/2013 Surgery   Right axillary SLNB ([place]):  3 SLNs negative.  Port also removed on this date.    03/20/2013 Definitive Surgery   Right skin-sparing total mastectomy Barry Dienes) with immediate reconstruction: No atypia or malignancy identified.  Benign skin & nipple. s/p neoadjuvant therapy.  Represents a complete response. Placement of tissue expanders.    03/20/2013 Pathologic Stage   ypT0; No evidence of cancer.    07/24/2013 Surgery   LEFT simple mastectomy with immediate reconstruction; showed ALH and fibroadenoma. Placement of tissue expanders.    01/07/2014 Surgery   Bilateral breast reconstruction ([place]): Removal of bilateral tissue expanders and placement of bilateral silicone breast implants.    06/03/2014 Survivorship   Survivorship Care Plan given to patient and reveiwed with her during in-person visit.      CURRENT THERAPY:  INTERVAL HISTORY: Jaime Murillo 62 y.o. female returns for f/u of her history of breast cancer.  She had lower back  pain, she is seeing her PCP and urgent care, and underwent steroid taper, diclofenac and baclofen.    Her PCP ordered an MRI of the lumbar spine for 01/28/2022.  She notes the meds have been nauseating  and she isn't eating as much, so her weight is down about 2 pounds   Patient Active Problem List   Diagnosis Date Noted   Women's annual routine gynecological examination 04/07/2020   History of breast cancer 04/07/2020   Screening examination for STD (sexually transmitted disease) 04/07/2020   Acquired absence of breast and nipple 01/07/2014   Personal history of breast cancer 07/11/2013   Fibroid uterus 07/04/2013   Chemotherapy-induced peripheral neuropathy ([place]) 02/08/2013   High blood pressure    Contact lens/glasses fitting    Malignant neoplasm of lower-inner quadrant of right breast of female, estrogen receptor negative ([place]) 06/23/2012   HSV (herpes simplex virus) anogenital infection 06/06/2012   Breast mass, right 06/06/2012   HTN (hypertension) 05/05/2011    is allergic to lyrica [pregabalin].  MEDICAL HISTORY: Past Medical History:  Diagnosis Date   Anemia    Anxiety    Depression    History of cancer chemotherapy 07-17-2012  to  12-04-2012   right breast cancer   HSV infection    Hypertension    Insomnia    Malignant neoplasm of lower-inner quadrant of right breast of female, estrogen receptor negative [facility]) oncologist-  dr Lindi Adie--  per lov note in epic ,  no recurrence   dx 05/ 2014---  right breast IDC & DCIS, Grade 3, triple negative----  completed neo-adjuvant chemo 12-04-2012,  03-20-2013  right total mastectomy with sln bx (07-24-2013  left simple mastectomy w/ sln bx)   Neuropathy associated with cancer ([place])    related to chemotherapy   Neuropathy of both feet    DUE TO CHEMO PER PATIENT   Nocturia    PONV (postoperative nausea and vomiting)    shaking after anesthesia; had n/v after mastectomy   Urinary frequency    Wears glasses     SURGICAL HISTORY: Past Surgical History:  Procedure Laterality Date   AIKEN OSTEOTOMY Right 01/27/2018   Procedure: DOUBLE   [name];  Surgeon: Evelina Bucy, DPM;  Location: [place];  Service: Podiatry;  Laterality: Right;  POPLITEAL/SAPH BLOCK   BREAST LUMPECTOMY Right 01/12/2013   Procedure: RIGHT SENTINEL LYMPH  NODE BIOPSY;  Surgeon: Stark Klein, MD;  Location: [place];  Service: General;  Laterality: Right;  RIGHT SENTINEL LYMPH NODE BIOPSY   BREAST RECONSTRUCTION WITH PLACEMENT OF TISSUE EXPANDER AND FLEX HD (ACELLULAR HYDRATED DERMIS) Right 03/20/2013   Procedure: IMMEDIATE RIGHT BREAST RECONSTRUCTION WITH PLACEMENT OF TISSUE EXPANDER AND FLEX HD (ACELLULAR HYDRATED DERMIS);  Surgeon: Irene Limbo, MD;  Location: [place];  Service: Plastics;  Laterality: Right;   BREAST RECONSTRUCTION WITH PLACEMENT OF TISSUE EXPANDER AND FLEX HD (ACELLULAR HYDRATED DERMIS) Left 07/24/2013   Procedure: BREAST RECONSTRUCTION WITH PLACEMENT OF TISSUE EXPANDER AND FLEX HD (ACELLULAR HYDRATED DERMIS) TO THE LEFT BREAST;  Surgeon: Irene Limbo, MD;  Location: [place];  Service: Plastics;  Laterality: Left;   BREAST RECONSTRUCTION WITH PLACEMENT OF TISSUE EXPANDER AND FLEX HD (ACELLULAR HYDRATED DERMIS) Bilateral 01/07/2014   Procedure: BREAST RECONSTRUCTION WITH REMOVAL  OF TISSUE EXPANDER AND PLACEMENT  OF SILICONE IMPLANTS TO BILATERAL BREAST;  Surgeon: Irene Limbo, MD;  Location: [facility] [place];  Service: Plastics;  Laterality: Bilateral;   COLONOSCOPY     LAPAROSCOPIC  TUBAL LIGATION Bilateral 08-10-2001    dr Ruthann Cancer  '@[facility]'$    with cauterization   PORT-A-CATH REMOVAL Left 01/12/2013   Procedure: REMOVAL PORT-A-CATH;  Surgeon: Stark Klein, MD;  Location: [place];  Service: General;  Laterality: Left;   PORTACATH PLACEMENT N/A 07/06/2012   Procedure: INSERTION PORT-A-CATH;  Surgeon: Stark Klein, MD;  Location: [place];  Service: General;  Laterality: N/A;   SIMPLE MASTECTOMY WITH AXILLARY SENTINEL NODE BIOPSY Left 07/24/2013   Procedure: SIMPLE MASTECTOMY;  Surgeon: Stark Klein, MD;  Location: [place];  Service:  General;  Laterality: Left;   TOTAL MASTECTOMY Right 03/20/2013   Procedure: RIGHT SKIN SPARING TOTAL MASTECTOMY;  Surgeon: Stark Klein, MD;  Location: [place];  Service: General;  Laterality: Right;   TRANSTHORACIC ECHOCARDIOGRAM  07/03/2012   ef 55-60%/  mild MR and TR/  mild dilated IVC    SOCIAL HISTORY: Social History   Socioeconomic History   Marital status: Single    Spouse name: Not on file   Number of children: 2   Years of education: 13   Highest education level: Not on file  Occupational History    Comment: Disabled  Tobacco Use   Smoking status: Never   Smokeless tobacco: Never  Vaping Use   Vaping Use: Never used  Substance and Sexual Activity   Alcohol use: No    Alcohol/week: 0.0 standard drinks of alcohol   Drug use: No   Sexual activity: Yes    Birth control/protection: Surgical    Comment: BTL  Other Topics Concern   Not on file  Social History Narrative   Patient lives at home with her son she is single.   Disabled.   Education- One year of college.   Right handed.   Caffeine- One cup daily.   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 To graduate May 2020   Social Determinants of Health   Financial Resource Strain: Not on file  Food Insecurity: Food Insecurity Present (04/07/2020)   Hunger Vital Sign    Worried About Running Out of Food in the Last Year: Often true    Ran Out of Food in the Last Year: Often true  Transportation Needs: Unmet Transportation Needs (04/07/2020)   PRAPARE - Hydrologist (Medical): Yes    Lack of Transportation (Non-Medical): Yes  Physical Activity: Not on file  Stress: Not on file  Social Connections: Not on file  Intimate Partner Violence: Not on file    FAMILY HISTORY: Family History  Problem Relation Age of Onset   Heart disease Mother    Aneurysm Mother    Hypertension Father    Prostate cancer Father 55   Hypertension Sister    Ovarian cancer Paternal  Grandmother        died in her 37s   Stomach cancer Maternal Grandmother 72   Cancer Cousin        paternal cousin with an unknown form of cancer    Review of Systems - Oncology    PHYSICAL EXAMINATION  ECOG PERFORMANCE STATUS: {CHL ONC ECOG BW:[id number]}  Vitals:   02/01/22 1118  BP: (!) 159/94  Pulse: 71  Resp: 17  Temp: (!) 97.5 F (36.4 C)  SpO2: 100%    Physical Exam  LABORATORY DATA:  CBC    Component Value Date/Time   WBC 4.0 01/27/2022 1405   WBC 2.3 (L) 09/14/2014 0825   RBC 3.73 (L) 01/27/2022 1405  HGB 12.2 01/27/2022 1405   HGB 10.2 (L) 03/28/2013 1106   HCT 34.7 (L) 01/27/2022 1405   HCT 30.3 (L) 03/28/2013 1106   PLT 255 01/27/2022 1405   PLT 242 03/28/2013 1106   MCV 93.0 01/27/2022 1405   MCV 93.4 03/28/2013 1106   MCH 32.7 01/27/2022 1405   MCHC 35.2 01/27/2022 1405   RDW 12.1 01/27/2022 1405   RDW 12.3 03/28/2013 1106   LYMPHSABS 0.4 (L) 01/27/2022 1405   LYMPHSABS 0.9 03/28/2013 1106   MONOABS 0.1 01/27/2022 1405   MONOABS 0.5 03/28/2013 1106   EOSABS 0.0 01/27/2022 1405   EOSABS 0.0 03/28/2013 1106   BASOSABS 0.0 01/27/2022 1405   BASOSABS 0.0 03/28/2013 1106    CMP     Component Value Date/Time   NA 136 01/27/2022 1405   NA 138 03/28/2013 1107   K 4.0 01/27/2022 1405   K 3.4 (L) 03/28/2013 1107   CL 101 01/27/2022 1405   CL 104 07/17/2012 1357   CO2 27 01/27/2022 1405   CO2 31 (H) 03/28/2013 1107   GLUCOSE 106 (H) 01/27/2022 1405   GLUCOSE 71 03/28/2013 1107   GLUCOSE 93 07/17/2012 1357   BUN 16 01/27/2022 1405   BUN 14.7 03/28/2013 1107   CREATININE 0.65 01/27/2022 1405   CREATININE 0.9 03/28/2013 1107   CALCIUM 9.2 01/27/2022 1405   CALCIUM 9.6 03/28/2013 1107   PROT 8.2 (H) 01/27/2022 1405   PROT 7.3 03/28/2013 1107   ALBUMIN 4.4 01/27/2022 1405   ALBUMIN 3.7 03/28/2013 1107   AST 26 01/27/2022 1405   AST 25 03/28/2013 1107   ALT 19 01/27/2022 1405   ALT 14 03/28/2013 1107   ALKPHOS 44 01/27/2022 1405    ALKPHOS 52 03/28/2013 1107   BILITOT 0.9 01/27/2022 1405   BILITOT 0.40 03/28/2013 1107   GFRNONAA >60 01/27/2022 1405   GFRAA >90 12/31/2013 0850       PENDING LABS:   RADIOGRAPHIC STUDIES:  No results found.   PATHOLOGY:     ASSESSMENT and THERAPY PLAN:   No problem-specific Assessment & Plan notes found for this encounter.   No orders of the defined types were placed in this encounter.   All questions were answered. The patient knows to call the clinic with any problems, questions or concerns. We can certainly see the patient much sooner if necessary. This note was electronically signed. Scot Dock, NP 02/01/2022

## 2022-02-02 ENCOUNTER — Telehealth: Payer: Self-pay

## 2022-02-02 ENCOUNTER — Encounter: Payer: Self-pay | Admitting: Adult Health

## 2022-02-02 NOTE — Telephone Encounter (Signed)
Called Pt to confirm [place] appt with [facility] on 1/11 at 1010. Address given. Pt verbalized understanding.

## 2022-02-02 NOTE — Telephone Encounter (Signed)
-----   Message from Lynnae Sandhoff, LPN sent at 02/02/5907  4:30 PM EST ----- Regarding: [place] axilla Hi Crissie,  Here is another pt we need a [place] axilla on within a week. Again, am I contacting the correct person for [place] ? Apologies if not.  Fulton Mole, LPN

## 2022-02-02 NOTE — Assessment & Plan Note (Addendum)
Jaime Murillo is a 62 year old woman with stage IA right breast triple negative breast cancer that was diagnosed in May 2014.  She is status post neoadjuvant chemotherapy followed by right mastectomy, left mastectomy 4 months later in June 2015.    Jaime Murillo is doing well today.  On exam she had right axillary adenopathy and I placed orders for an axillary ultrasound to be completed at the breast center.  She does struggle with chemotherapy-induced peripheral neuropathy for which we managed with gabapentin and Cymbalta.  She is also on intermittent FMLA when she has flares with her neuropathy.  That he will undergo the right axilla ultrasound followed by the MRI which is scheduled on January 21.  I will see her on January 22 for follow-up to see how she is doing and review the results.

## 2022-02-04 ENCOUNTER — Ambulatory Visit
Admission: RE | Admit: 2022-02-04 | Discharge: 2022-02-04 | Disposition: A | Discharge status: Home / Self Care | Payer: 59 | Source: Ambulatory Visit | Attending: Adult Health | Admitting: Adult Health

## 2022-02-04 ENCOUNTER — Other Ambulatory Visit: Payer: Self-pay | Admitting: Adult Health

## 2022-02-04 DIAGNOSIS — C50311 Malignant neoplasm of lower-inner quadrant of right female breast: Secondary | ICD-10-CM

## 2022-02-04 DIAGNOSIS — R590 Localized enlarged lymph nodes: Secondary | ICD-10-CM

## 2022-02-05 ENCOUNTER — Telehealth: Payer: Self-pay

## 2022-02-05 ENCOUNTER — Other Ambulatory Visit: Payer: Self-pay | Admitting: Adult Health

## 2022-02-05 DIAGNOSIS — R590 Localized enlarged lymph nodes: Secondary | ICD-10-CM

## 2022-02-05 DIAGNOSIS — T859XXA Unspecified complication of internal prosthetic device, implant and graft, initial encounter: Secondary | ICD-10-CM

## 2022-02-05 NOTE — Telephone Encounter (Signed)
Called Pt back regarding voicemail. Pt states that per her breast US/MM on 1/11, Breast Center recommended breast MRI that we needed to order. Order placed and Pt given [facility] number to schedule. Pt verbalized understanding with no further questions.

## 2022-02-14 ENCOUNTER — Other Ambulatory Visit: Payer: Medicare Other

## 2022-02-15 ENCOUNTER — Telehealth: Payer: Self-pay

## 2022-02-15 ENCOUNTER — Ambulatory Visit: Payer: Medicare Other | Admitting: Adult Health

## 2022-02-15 NOTE — Telephone Encounter (Signed)
Called back and told FMLA paper is completed and on Dr. Lindi Adie desk to be signed.

## 2022-02-15 NOTE — Telephone Encounter (Signed)
Returned her call. She is checking on FMLA paper work and said that she dropped it off 2 weeks ago. Sent a message to the [facility] department to check on the paper work.

## 2022-02-16 ENCOUNTER — Telehealth: Payer: Self-pay

## 2022-02-16 NOTE — Telephone Encounter (Signed)
Notified Patient of completion of FMLA forms. Fax transmission confirmation received. Copy of forms placed for pick-up and emailed to Patient as requested. No other needs or concerns voiced at this time.

## 2022-02-18 ENCOUNTER — Other Ambulatory Visit: Payer: Self-pay | Admitting: Adult Health

## 2022-02-18 DIAGNOSIS — T859XXA Unspecified complication of internal prosthetic device, implant and graft, initial encounter: Secondary | ICD-10-CM

## 2022-02-18 DIAGNOSIS — R590 Localized enlarged lymph nodes: Secondary | ICD-10-CM

## 2022-02-19 ENCOUNTER — Ambulatory Visit
Admission: RE | Admit: 2022-02-19 | Discharge: 2022-02-19 | Disposition: A | Discharge status: Home / Self Care | Payer: 59 | Source: Ambulatory Visit | Attending: Adult Health | Admitting: Adult Health

## 2022-02-19 ENCOUNTER — Inpatient Hospital Stay: Admission: RE | Admit: 2022-02-19 | Payer: 59 | Source: Ambulatory Visit

## 2022-02-19 DIAGNOSIS — R590 Localized enlarged lymph nodes: Secondary | ICD-10-CM

## 2022-02-19 DIAGNOSIS — T859XXA Unspecified complication of internal prosthetic device, implant and graft, initial encounter: Secondary | ICD-10-CM

## 2022-02-22 ENCOUNTER — Inpatient Hospital Stay (HOSPITAL_BASED_OUTPATIENT_CLINIC_OR_DEPARTMENT_OTHER): Payer: 59 | Admitting: Adult Health

## 2022-02-22 ENCOUNTER — Other Ambulatory Visit: Payer: Self-pay

## 2022-02-22 ENCOUNTER — Telehealth: Payer: Self-pay

## 2022-02-22 ENCOUNTER — Encounter: Payer: Self-pay | Admitting: Adult Health

## 2022-02-22 VITALS — BP 129/71 | HR 85 | Temp 98.1°F | Resp 18 | Ht 65.0 in | Wt 119.6 lb

## 2022-02-22 DIAGNOSIS — Z171 Estrogen receptor negative status [ER-]: Secondary | ICD-10-CM

## 2022-02-22 DIAGNOSIS — C50311 Malignant neoplasm of lower-inner quadrant of right female breast: Secondary | ICD-10-CM

## 2022-02-22 NOTE — Telephone Encounter (Signed)
Called and spoke with Marissa Nestle regarding Pt's MRI appt. Pt is scheduled for breast MRI with contrast on 2/5 at 0930. Pt is to be NPO 4 hours prior excluding liquids. Nicole Kindred verbalized understanding and stated he would relay information to Pt. Gave call back number with any questions.

## 2022-02-22 NOTE — Assessment & Plan Note (Signed)
Jaime Murillo is a 62 year old woman with history of triple negative breast cancer that was diagnosed in 2015 status post bilateral mastectomies and adjuvant chemotherapy.  She is here today for follow-up and evaluation after discovering incidental right axillary nodule at her last in office visit on January 8.  We are awaiting final read of the breast MRI however she will review these results with Dr. Iran Planas this afternoon as they are not back yet for me to review and ensure there is no signs of recurrence on this MRI.  I also encouraged her to discuss the results with Dr. Iran Planas what it means and how it impacts her upcoming surgery with the risk-benefit conversation.  Chart her FMLA was completed and sent off.  We will see Jaime Murillo back in about 5 months before she heads out on a mission trip to [place].

## 2022-02-22 NOTE — Progress Notes (Signed)
Pt stated during her OV that her employer is missing part of FMLA paperwork. This RN reprinted paperwork from media, NP signed, and faxed to [employer] with receipt confirmation.

## 2022-02-22 NOTE — Progress Notes (Signed)
[facility] Follow up:    Jaime Holms, NP [place] [address]   DIAGNOSIS:  Cancer Staging  Malignant neoplasm of lower-inner quadrant of right breast of female, estrogen receptor negative ([place]) Staging form: Breast, AJCC 7th Edition - Clinical: Stage IA (T1c, N0, cM0) - Unsigned Specimen type: Core Needle Biopsy Histopathologic type: 9931 Laterality: Right Staging comments: Staged at breast conference 6.4.14  - Pathologic stage from 03/20/2013: yT0 - Unsigned Staged by: Pathologist Specimen type: Core Needle Biopsy Histopathologic type: 9931 Stage prefix: Post-therapy Laterality: Right   SUMMARY OF ONCOLOGIC HISTORY: Oncology History  Malignant neoplasm of lower-inner quadrant of right breast of female, estrogen receptor negative ([place])  06/20/2012 Initial Biopsy   Right breast needle core biopsy (3 o'clock): Grade 3, IDC & DCIS.  ER- (0%), PR- (0%), HER2- (ratio 1.26). Ki67 95%.    06/20/2012 Breast US   Right breast with slightly irregular oval hypoechoic mass measuring 7 x 6 x 17 mm at 3 o'clock postion. No mammographic evidence of breast malignancy bilaterally. Palpable area of concern at 12 o'clock, but no suspicious findings at this location on [place].    06/23/2012 Initial Diagnosis   Cancer of lower-inner quadrant of RIGHT female breast   06/26/2012 Breast MRI   Right breast w/clumped NME at 2 o'clock position measuring 0.8 x 1.6 x 2.2 cm located anterior/superior to known cancer. Also 6 mm ill-defined ovoid mass posterior lateral to bx-proven ca, lying superficial pect. muscle. Likely multifocal disease.    07/03/2012 Echocardiogram   Pre-treatment EF: 55-60%   07/03/2012 Imaging   CT c/a/p: No evidence of metastatic disease.    07/05/2012 Procedure   Genetic counseling/testing: OncoGeneDX with 3 VUS identified. 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  Also heteroxygous for 2 likely benign variants on BRCA1 and NBN.   Remainder of genes in GeneDX panel negative.    07/12/2012 Procedure   Right breast needle core biopsy: Benign breast tissue with focal pseudoangiomatous stromal hyperplasic (PASH). No evidence of malignancy.    07/17/2012 - 08/28/2012 Neo-Adjuvant Chemotherapy   Adriamycin & Cytoxan x 4 cycles completed.    09/11/2012 - 10/23/2012 Neo-Adjuvant Chemotherapy   Taxol/Carboplatin weekly x 7 cycles (stopped due to peripheral neuropathy; changed to Gemcitabine/Carbo for additional 3 cycles).   11/06/2012 - 12/04/2012 Neo-Adjuvant Chemotherapy   Gemcitabine/Carboplatin x 3 cycles completed.    11/24/2012 Breast MRI   Previous right breast multifocal malignancy with complete imaging resolution.  Stable 6 mm hemangioma or cyst in liver.    01/12/2013 Surgery   Right axillary SLNB ([place]):  3 SLNs negative.  Port also removed on this date.    03/20/2013 Definitive Surgery   Right skin-sparing total mastectomy [facility]) with immediate reconstruction: No atypia or malignancy identified.  Benign skin & nipple. s/p neoadjuvant therapy.  Represents a complete response. Placement of tissue expanders.    03/20/2013 Pathologic Stage   ypT0; No evidence of cancer.    07/24/2013 Surgery   LEFT simple mastectomy with immediate reconstruction; showed ALH and fibroadenoma. Placement of tissue expanders.    01/07/2014 Surgery   Bilateral breast reconstruction ([place]): Removal of bilateral tissue expanders and placement of bilateral silicone breast implants.    06/03/2014 Survivorship   Survivorship Care Plan given to patient and reveiwed with her during in-person visit.      CURRENT THERAPY: Observation  INTERVAL HISTORY: Jaime Murillo 62 y.o. female returns for follow-up after undergoing breast MRI and mammogram and ultrasound.  Please note at her last visit on January 8 we palpated some axillary adenopathy.  She underwent ultrasound and mammogram which indicated a possible implant rupture therefore we  ordered an MRI of the breast bilateral without contrast to evaluate this further.  Underwent this on February 19, 2022 however we do not have the results from this MRI in the system at this time.  She has an appointment with Dr. Iran Planas this afternoon.  She does feel like the lump underneath her right axilla is enlarging and she also tells me that she has a bulging disc in her spine on and is scheduled for back surgery in early February.   Patient Active Problem List   Diagnosis Date Noted   Women's annual routine gynecological examination 04/07/2020   History of breast cancer 04/07/2020   Screening examination for STD (sexually transmitted disease) 04/07/2020   Acquired absence of breast and nipple 01/07/2014   Personal history of breast cancer 07/11/2013   Fibroid uterus 07/04/2013   Chemotherapy-induced peripheral neuropathy ([place]) 02/08/2013   High blood pressure    Contact lens/glasses fitting    Malignant neoplasm of lower-inner quadrant of right breast of female, estrogen receptor negative ([place]) 06/23/2012   HSV (herpes simplex virus) anogenital infection 06/06/2012   Breast mass, right 06/06/2012   HTN (hypertension) 05/05/2011    is allergic to lyrica [pregabalin].  MEDICAL HISTORY: Past Medical History:  Diagnosis Date   Anemia    Anxiety    Depression    History of cancer chemotherapy 07-17-2012  to  12-04-2012   right breast cancer   HSV infection    Hypertension    Insomnia    Malignant neoplasm of lower-inner quadrant of right breast of female, estrogen receptor negative [facility]) oncologist-  dr Lindi Adie--  per lov note in epic ,  no recurrence   dx 05/ 2014---  right breast IDC & DCIS, Grade 3, triple negative----  completed neo-adjuvant chemo 12-04-2012,  03-20-2013  right total mastectomy with sln bx (07-24-2013  left simple mastectomy w/ sln bx)   Neuropathy associated with cancer ([place])    related to chemotherapy   Neuropathy of both feet    DUE TO CHEMO PER  PATIENT   Nocturia    PONV (postoperative nausea and vomiting)    shaking after anesthesia; had n/v after mastectomy   Urinary frequency    Wears glasses     SURGICAL HISTORY: Past Surgical History:  Procedure Laterality Date   AIKEN OSTEOTOMY Right 01/27/2018   Procedure: DOUBLE   [name];  Surgeon: Evelina Bucy, DPM;  Location: [place];  Service: Podiatry;  Laterality: Right;  POPLITEAL/SAPH BLOCK   BREAST LUMPECTOMY Right 01/12/2013   Procedure: RIGHT SENTINEL LYMPH  NODE BIOPSY;  Surgeon: Stark Klein, MD;  Location: [place];  Service: General;  Laterality: Right;  RIGHT SENTINEL LYMPH NODE BIOPSY   BREAST RECONSTRUCTION WITH PLACEMENT OF TISSUE EXPANDER AND FLEX HD (ACELLULAR HYDRATED DERMIS) Right 03/20/2013   Procedure: IMMEDIATE RIGHT BREAST RECONSTRUCTION WITH PLACEMENT OF TISSUE EXPANDER AND FLEX HD (ACELLULAR HYDRATED DERMIS);  Surgeon: Irene Limbo, MD;  Location: [place];  Service: Plastics;  Laterality: Right;   BREAST RECONSTRUCTION WITH PLACEMENT OF TISSUE EXPANDER AND FLEX HD (ACELLULAR HYDRATED DERMIS) Left 07/24/2013   Procedure: BREAST RECONSTRUCTION WITH PLACEMENT OF TISSUE EXPANDER AND FLEX HD (ACELLULAR HYDRATED DERMIS) TO THE LEFT BREAST;  Surgeon: Irene Limbo, MD;  Location: [place];  Service: Plastics;  Laterality: Left;  BREAST RECONSTRUCTION WITH PLACEMENT OF TISSUE EXPANDER AND FLEX HD (ACELLULAR HYDRATED DERMIS) Bilateral 01/07/2014   Procedure: BREAST RECONSTRUCTION WITH REMOVAL  OF TISSUE EXPANDER AND PLACEMENT  OF SILICONE IMPLANTS TO BILATERAL BREAST;  Surgeon: Irene Limbo, MD;  Location: [place];  Service: Plastics;  Laterality: Bilateral;   COLONOSCOPY     LAPAROSCOPIC TUBAL LIGATION Bilateral 08-10-2001    dr Ruthann Cancer  '@[facility]'$    with cauterization   PORT-A-CATH REMOVAL Left 01/12/2013   Procedure: REMOVAL PORT-A-CATH;  Surgeon: Stark Klein, MD;  Location: [place];   Service: General;  Laterality: Left;   PORTACATH PLACEMENT N/A 07/06/2012   Procedure: INSERTION PORT-A-CATH;  Surgeon: Stark Klein, MD;  Location: [place];  Service: General;  Laterality: N/A;   SIMPLE MASTECTOMY WITH AXILLARY SENTINEL NODE BIOPSY Left 07/24/2013   Procedure: SIMPLE MASTECTOMY;  Surgeon: Stark Klein, MD;  Location: [place];  Service: General;  Laterality: Left;   TOTAL MASTECTOMY Right 03/20/2013   Procedure: RIGHT SKIN SPARING TOTAL MASTECTOMY;  Surgeon: Stark Klein, MD;  Location: [place];  Service: General;  Laterality: Right;   TRANSTHORACIC ECHOCARDIOGRAM  07/03/2012   ef 55-60%/  mild MR and TR/  mild dilated IVC    SOCIAL HISTORY: Social History   Socioeconomic History   Marital status: Single    Spouse name: Not on file   Number of children: 2   Years of education: 13   Highest education level: Not on file  Occupational History    Comment: Disabled  Tobacco Use   Smoking status: Never   Smokeless tobacco: Never  Vaping Use   Vaping Use: Never used  Substance and Sexual Activity   Alcohol use: No    Alcohol/week: 0.0 standard drinks of alcohol   Drug use: No   Sexual activity: Yes    Birth control/protection: Surgical    Comment: BTL  Other Topics Concern   Not on file  Social History Narrative   Patient lives at home with her son she is single.   Disabled.   Education- One year of college.   Right handed.   Caffeine- One cup daily.   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 To graduate May 2020   Social Determinants of Health   Financial Resource Strain: Not on file  Food Insecurity: Food Insecurity Present (04/07/2020)   Hunger Vital Sign    Worried About Running Out of Food in the Last Year: Often true    Ran Out of Food in the Last Year: Often true  Transportation Needs: Unmet Transportation Needs (04/07/2020)   PRAPARE - Hydrologist (Medical): Yes    Lack of  Transportation (Non-Medical): Yes  Physical Activity: Not on file  Stress: Not on file  Social Connections: Not on file  Intimate Partner Violence: Not on file    FAMILY HISTORY: Family History  Problem Relation Age of Onset   Heart disease Mother    Aneurysm Mother    Hypertension Father    Prostate cancer Father 59   Hypertension Sister    Ovarian cancer Paternal Grandmother        died in her 34s   Stomach cancer Maternal Grandmother 66   Cancer Cousin        paternal cousin with an unknown form of cancer    Review of Systems  Constitutional:  Negative for appetite change, chills, fatigue, fever and unexpected weight change.  HENT:   Negative for  hearing loss, lump/mass and trouble swallowing.   Eyes:  Negative for eye problems and icterus.  Respiratory:  Negative for chest tightness, cough and shortness of breath.   Cardiovascular:  Negative for chest pain, leg swelling and palpitations.  Gastrointestinal:  Negative for abdominal distention, abdominal pain, constipation, diarrhea, nausea and vomiting.  Endocrine: Negative for hot flashes.  Genitourinary:  Negative for difficulty urinating.   Musculoskeletal:  Positive for back pain (Related to disc issues). Negative for arthralgias.  Skin:  Negative for itching and rash.  Neurological:  Negative for dizziness, extremity weakness, headaches and numbness.  Hematological:  Negative for adenopathy. Does not bruise/bleed easily.  Psychiatric/Behavioral:  Negative for depression. The patient is not nervous/anxious.       PHYSICAL EXAMINATION  ECOG PERFORMANCE STATUS: 1 - Symptomatic but completely ambulatory  Vitals:   02/22/22 0856  BP: 129/71  Pulse: 85  Resp: 18  Temp: 98.1 F (36.7 C)  SpO2: 100%    GENERAL: Patient is a well appearing female in no acute distress HEENT:  Sclerae anicteric.  Oropharynx clear and moist. No ulcerations or evidence of oropharyngeal candidiasis. Neck is supple.  NODES:  No  cervical, supraclavicular, or axillary lymphadenopathy palpated.  BREAST EXAM: Right breast is benign however there is right axillary nodule that is about 2 cm in size LUNGS:  Clear to auscultation bilaterally.  No wheezes or rhonchi. HEART:  Regular rate and rhythm. No murmur appreciated. ABDOMEN:  Soft, nontender.  Positive, normoactive bowel sounds. No organomegaly palpated. MSK:  No focal spinal tenderness to palpation. Full range of motion bilaterally in the upper extremities. EXTREMITIES:  No peripheral edema.   SKIN:  Clear with no obvious rashes or skin changes. No nail dyscrasia. NEURO:  Nonfocal. Well oriented.  Appropriate affect.   LABORATORY DATA:  None for this visit    ASSESSMENT and THERAPY PLAN:   Malignant neoplasm of lower-inner quadrant of right breast of female, estrogen receptor negative ([place]) Jaime Murillo is a 62 year old woman with history of triple negative breast cancer that was diagnosed in 2015 status post bilateral mastectomies and adjuvant chemotherapy.  She is here today for follow-up and evaluation after discovering incidental right axillary nodule at her last in office visit on January 8.  We are awaiting final read of the breast MRI however she will review these results with Dr. Iran Planas this afternoon as they are not back yet for me to review and ensure there is no signs of recurrence on this MRI.  I also encouraged her to discuss the results with Dr. Iran Planas what it means and how it impacts her upcoming surgery with the risk-benefit conversation.  Chart her FMLA was completed and sent off.  We will see Jaime Murillo back in about 5 months before she heads out on a mission trip to [place].    All questions were answered. The patient knows to call the clinic with any problems, questions or concerns. We can certainly see the patient much sooner if necessary.  Total encounter time:30 minutes*in face-to-face visit time, chart review, lab review, care coordination,  order entry, and documentation of the encounter time.    Wilber Bihari, NP 02/22/22 9:55 AM Medical Oncology and Hematology [facility] [place], [place] [address] Tel. [phone]    Fax. [phone]  *Total Encounter Time as defined by the Centers for Medicare and Medicaid Services includes, in addition to the face-to-face time of a patient visit (documented in the note above) non-face-to-face time: obtaining and  reviewing outside history, ordering and reviewing medications, tests or procedures, care coordination (communications with other health care professionals or caregivers) and documentation in the medical record.

## 2022-02-23 ENCOUNTER — Ambulatory Visit
Admission: RE | Admit: 2022-02-23 | Discharge: 2022-02-23 | Disposition: A | Discharge status: Home / Self Care | Payer: 59 | Source: Ambulatory Visit | Attending: Adult Health | Admitting: Adult Health

## 2022-02-23 ENCOUNTER — Ambulatory Visit
Admission: RE | Admit: 2022-02-23 | Discharge: 2022-02-23 | Disposition: A | Discharge status: Home / Self Care | Payer: 59 | Source: Ambulatory Visit | Attending: Registered Nurse | Admitting: Registered Nurse

## 2022-02-23 DIAGNOSIS — T859XXA Unspecified complication of internal prosthetic device, implant and graft, initial encounter: Secondary | ICD-10-CM

## 2022-02-23 DIAGNOSIS — R590 Localized enlarged lymph nodes: Secondary | ICD-10-CM

## 2022-02-23 DIAGNOSIS — M545 Low back pain, unspecified: Secondary | ICD-10-CM

## 2022-02-23 MED ORDER — GADOPICLENOL 0.5 MMOL/ML IV SOLN
6.0000 mL | Freq: Once | INTRAVENOUS | Status: AC | PRN
  Administered 2022-02-23: 6 mL via INTRAVENOUS

## 2022-03-01 ENCOUNTER — Other Ambulatory Visit: Payer: 59

## 2022-04-29 NOTE — H&P (Signed)
Subjective:     Patient ID:    HPI   8.5 years post op bilateral mastectomies with immediate reconstruction. Returns today for follow up discussion prior to planned revision bilateral breast reconstruction.  Noted at Oncology follow up 1.2024 to have axillary mass. MMG/US as below felt area likely silicone. Subsequent MRI demonstrated right rupture. Also noted were two axillary LN and three internal mammary LN thought to contain silicone. MRI with contrast recommended to evaluate these as below.   Completed neoadjuvant treatment 11/2012 for a right-sided breast cancer that wastriple negative. Responded to chemotherapy and resolved radiographically.  SLN bx done prior to mastectomy and this was negative. Underwent right mastectomy with immediate dual plane TE placement. Final pathology from this with no residual carcinoma. Following this, she elected for left risk reduction mastetcomy and immediate dual plane TE placement. Left breast pathology with ALH.   34 B size prior. Right mastectomy 310 g Left mastectomy 297 g   Working at [employer] in [employer], does this two days a week from home. 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 On intermittent FMLA for peripheral neuropathy.   CLINICAL DATA:  Mastectomy with implant reconstruction. Palpable lump in the low right axilla.   EXAM: DIGITAL DIAGNOSTIC UNILATERAL RIGHT MAMMOGRAM WITH IMPLANTS, TOMOSYNTHESIS   TECHNIQUE: Right digital diagnostic mammography and breast tomosynthesis was performed. Standard and/or implant displaced views were performed.   COMPARISON:  Previous exam(s).   ACR Breast Density Category a: The breast tissue is almost entirely fatty.   FINDINGS: The patient was first imaged with ultrasound. The ultrasound demonstrated a masslike region with associated snowstorm appearance consistent with extracapsular silicone. The patient was taken to mammography for further evaluation.   There is a dense  mass in the region of the patient's symptoms consistent with either a dense collection of silicone or more likely silicone within a low axillary lymph node.   IMPRESSION: The patient is palpating extracapsular silicone, probably within a lower axillary lymph node.   RECOMMENDATION: Recommend breast MRI with an implant protocol for complete assessment of both implants and the palpable lump.   I have discussed the findings and recommendations with the patient. If applicable, a reminder letter will be sent to the patient regarding the next appointment.   BI-RADS CATEGORY  2: Benign.   Electronically Signed: By: Dorise Bullion III M.D. On: 02/04/2022 12:20   CLINICAL DATA:  Evaluation of right axillary adenopathy and internal mammary lymph nodes seen on recent MRI for implant rupture. Patient is status post bilateral mastectomy with implant reconstruction for right breast cancer in 2014.   EXAM: BILATERAL BREAST MRI WITH AND WITHOUT CONTRAST   TECHNIQUE: Multiplanar, multisequence MR images of both breasts were obtained prior to and following the intravenous administration of 6 ml Vueway   Three-dimensional MR images were rendered by post-processing of the original MR data on an independent workstation. The three-dimensional MR images were interpreted, and findings are reported in the following complete MRI report for this study. Three dimensional images were evaluated at the independent interpreting workstation using the DynaCAD thin client.   COMPARISON:  None available.   FINDINGS: Breast composition: a. Almost entirely fat.   Background parenchymal enhancement: Moderate.   Status post bilateral mastectomy. There are bilateral retropectoral silicone implants. The right silicone implant demonstrates rupture, as described on previous MRI.   Right breast: No mass or abnormal enhancement.   Left breast: No mass or abnormal enhancement.   Lymph nodes: There are  2  enlarged lymph nodes in the right axilla with mild enhancement. There are 3 enlarged right-sided internal mammary lymph nodes with minimal enhancement.   Ancillary findings:  None.   IMPRESSION: 1. There are 2 enlarged right axillary lymph nodes with mild enhancement. There are 3 enlarged right-sided internal mammary lymph nodes with minimal enhancement. These lymph nodes were bright on silicone only sequence on recent prior MRI, consistent with silicone lymphadenopathy. 2. No MRI evidence of malignancy in the bilateral breasts. 3. Right silicone implant rupture.   RECOMMENDATION: 1.  Surgical consultation for right breast implant rupture.   2.  Continued clinical surveillance of the bilateral breasts.   BI-RADS CATEGORY  2: Benign.     Electronically Signed   By: Audie Pinto M.D.   On: 02/24/2022 15:38         Objective:   Physical Exam   CV: normal heart sounds normal rate Pulm: clear to auscultation bilateral    Chest: soft with some rippling bilateral, no contracture bilateral animation RIGHT axilla visible and palpable mass SN to areola R 22 L 20.5 Areola to IMF R 5 L 5     Assessment:     Hx R breast cancer , family history breast cancer, dense breasts Neoadjuvant chemotherapy S/p right SLN S/p right areola sparing mastectomy, dual plane TE/ADM (FlexHD) reconstruction S/p left areola sparing mastectomy, dual plane TE/ADM (FlexHD) reconstruction S/p bilateral silicone implant exchange Right extracapsular implant rupture   Plan:    Plan removal bilateral breast implants, right capsulectomy, possible left, placement bilateral silicone implants.    Reviewed that she does have textured implants. Reviewed current reported incidence BIA ALCL with Siltex. Counseled removal textured implant does not necessarily reduce risk of this. Reviewed her current implants are still available on market. Reviewed option remove right ruptured implant, and palpable  axillary area, and replace with same implant. Given rupture anticipate capsulectomy to aid with removal implant and this would be accompanied by drain. Alternative to switch to smooth round implants bilateral. Reviewed purpose of texturing. Reviewed with smooth implants, nothing about implant itself will hold implant in place and requires tight pocket to keep implant from moving. Reviewed examples of smooth round implants and reviewed this will be different shape than her current anatomic implants, can expect more upper pole fullness. Reviewed risks smooth cohesive gel implants including AP malposition. This can require additional procedures. Patient has elected for change to smooth silicone implants. Reviewed implants are not permanent devices and additional surgery related to them common.    Completed [facility] Reconstruction physician patient checklist.   Additional risks including but not limited to bleeding, hematoma, seroma, infection, wound healing problems, asymmetry, contracture, damage to adjacent structures, need for additional surgery, blood clots in legs or lungs reviewed.    Rx for bactrim  robaxin and oxycodone given. Drain teaching completed.   Mentor Memory Shape 345 ml (Medium Height, High Projection) anatomic implants placed bilaterally

## 2022-05-07 ENCOUNTER — Encounter (HOSPITAL_BASED_OUTPATIENT_CLINIC_OR_DEPARTMENT_OTHER): Payer: Self-pay | Admitting: Plastic Surgery

## 2022-05-07 ENCOUNTER — Other Ambulatory Visit: Payer: Self-pay

## 2022-05-12 ENCOUNTER — Encounter (HOSPITAL_BASED_OUTPATIENT_CLINIC_OR_DEPARTMENT_OTHER)
Admission: RE | Admit: 2022-05-12 | Discharge: 2022-05-12 | Disposition: A | Discharge status: Home / Self Care | Payer: 59 | Source: Ambulatory Visit | Attending: Plastic Surgery | Admitting: Plastic Surgery

## 2022-05-12 DIAGNOSIS — Z01818 Encounter for other preprocedural examination: Secondary | ICD-10-CM | POA: Diagnosis present

## 2022-05-12 LAB — BASIC METABOLIC PANEL
Anion gap: 8 (ref 5–15)
BUN: 15 mg/dL (ref 6–20)
CO2: 26 mmol/L (ref 22–32)
Calcium: 9.3 mg/dL (ref 8.9–10.3)
Chloride: 105 mmol/L (ref 98–111)
Creatinine, Ser: 0.7 mg/dL (ref 0.44–1.00)
GFR, Estimated: >60 mL/min (ref >60)
Glucose, Bld: 72 mg/dL (ref 70–99)
Potassium: 3.5 mmol/L (ref 3.5–5.1)
Sodium: 139 mmol/L (ref 135–145)

## 2022-05-12 MED ORDER — CHLORHEXIDINE GLUCONATE CLOTH 2 % EX PADS: 6.0000 | MEDICATED_PAD | Freq: Once | CUTANEOUS | Status: DC

## 2022-05-12 NOTE — Progress Notes (Signed)

## 2022-05-14 ENCOUNTER — Other Ambulatory Visit: Payer: Self-pay

## 2022-05-14 ENCOUNTER — Ambulatory Visit (HOSPITAL_BASED_OUTPATIENT_CLINIC_OR_DEPARTMENT_OTHER): Payer: 59 | Admitting: Certified Registered"

## 2022-05-14 ENCOUNTER — Encounter (HOSPITAL_BASED_OUTPATIENT_CLINIC_OR_DEPARTMENT_OTHER): Payer: Self-pay | Admitting: Plastic Surgery

## 2022-05-14 ENCOUNTER — Ambulatory Visit (HOSPITAL_BASED_OUTPATIENT_CLINIC_OR_DEPARTMENT_OTHER)
Admission: RE | Admit: 2022-05-14 | Discharge: 2022-05-14 | Disposition: A | Discharge status: Home / Self Care | Payer: 59 | Attending: Plastic Surgery | Admitting: Plastic Surgery

## 2022-05-14 ENCOUNTER — Encounter (HOSPITAL_BASED_OUTPATIENT_CLINIC_OR_DEPARTMENT_OTHER)
Admission: RE | Disposition: A | Discharge status: Home / Self Care | Payer: Self-pay | Source: Home / Self Care | Attending: Plastic Surgery

## 2022-05-14 DIAGNOSIS — Z803 Family history of malignant neoplasm of breast: Secondary | ICD-10-CM | POA: Insufficient documentation

## 2022-05-14 DIAGNOSIS — Z9221 Personal history of antineoplastic chemotherapy: Secondary | ICD-10-CM | POA: Diagnosis not present

## 2022-05-14 DIAGNOSIS — T8549XA Other mechanical complication of breast prosthesis and implant, initial encounter: Secondary | ICD-10-CM | POA: Insufficient documentation

## 2022-05-14 DIAGNOSIS — Z9013 Acquired absence of bilateral breasts and nipples: Secondary | ICD-10-CM | POA: Insufficient documentation

## 2022-05-14 DIAGNOSIS — N6092 Unspecified benign mammary dysplasia of left breast: Secondary | ICD-10-CM | POA: Diagnosis not present

## 2022-05-14 DIAGNOSIS — Y812 Prosthetic and other implants, materials and accessory general- and plastic-surgery devices associated with adverse incidents: Secondary | ICD-10-CM | POA: Diagnosis not present

## 2022-05-14 DIAGNOSIS — G629 Polyneuropathy, unspecified: Secondary | ICD-10-CM | POA: Insufficient documentation

## 2022-05-14 DIAGNOSIS — I10 Essential (primary) hypertension: Secondary | ICD-10-CM | POA: Insufficient documentation

## 2022-05-14 DIAGNOSIS — Z853 Personal history of malignant neoplasm of breast: Secondary | ICD-10-CM | POA: Diagnosis not present

## 2022-05-14 DIAGNOSIS — Z08 Encounter for follow-up examination after completed treatment for malignant neoplasm: Secondary | ICD-10-CM | POA: Diagnosis not present

## 2022-05-14 HISTORY — PX: PLACEMENT OF BREAST IMPLANTS: SHX6334

## 2022-05-14 HISTORY — PX: CAPSULECTOMY: SHX5381

## 2022-05-14 HISTORY — PX: BREAST IMPLANT REMOVAL: SHX5361

## 2022-05-14 SURGERY — REMOVAL, IMPLANT, BREAST
Anesthesia: General | Site: Chest | Laterality: Right

## 2022-05-14 MED ORDER — ACETAMINOPHEN 500 MG PO TABS
1000.0000 mg | ORAL_TABLET | ORAL | Status: AC
  Administered 2022-05-14: 1000 mg via ORAL

## 2022-05-14 MED ORDER — KETOROLAC TROMETHAMINE 30 MG/ML IJ SOLN: 30.0000 mg | Freq: Once | INTRAMUSCULAR | Status: DC | PRN

## 2022-05-14 MED ORDER — SCOPOLAMINE 1 MG/3DAYS TD PT72
MEDICATED_PATCH | TRANSDERMAL | Status: AC
  Filled 2022-05-14: qty 1

## 2022-05-14 MED ORDER — SODIUM CHLORIDE 0.9 % IV SOLN
INTRAVENOUS | Status: DC | PRN
  Administered 2022-05-14: 500 mL

## 2022-05-14 MED ORDER — PHENYLEPHRINE HCL (PRESSORS) 10 MG/ML IV SOLN
INTRAVENOUS | Status: DC | PRN
  Administered 2022-05-14 (×2): 80 ug via INTRAVENOUS

## 2022-05-14 MED ORDER — OXYCODONE HCL 5 MG PO TABS
5.0000 mg | ORAL_TABLET | Freq: Once | ORAL | Status: AC | PRN
  Administered 2022-05-14: 5 mg via ORAL

## 2022-05-14 MED ORDER — EPHEDRINE 5 MG/ML INJ
INTRAVENOUS | Status: AC
  Filled 2022-05-14: qty 5

## 2022-05-14 MED ORDER — ONDANSETRON HCL 4 MG/2ML IJ SOLN: 4.0000 mg | Freq: Once | INTRAMUSCULAR | Status: DC | PRN

## 2022-05-14 MED ORDER — ACETAMINOPHEN 500 MG PO TABS
ORAL_TABLET | ORAL | Status: AC
  Filled 2022-05-14: qty 2

## 2022-05-14 MED ORDER — DEXAMETHASONE SODIUM PHOSPHATE 10 MG/ML IJ SOLN
INTRAMUSCULAR | Status: AC
  Filled 2022-05-14: qty 1

## 2022-05-14 MED ORDER — SCOPOLAMINE 1 MG/3DAYS TD PT72
1.0000 | MEDICATED_PATCH | TRANSDERMAL | Status: DC
  Administered 2022-05-14: 1.5 mg via TRANSDERMAL

## 2022-05-14 MED ORDER — SUCCINYLCHOLINE CHLORIDE 200 MG/10ML IV SOSY
PREFILLED_SYRINGE | INTRAVENOUS | Status: AC
  Filled 2022-05-14: qty 10

## 2022-05-14 MED ORDER — MIDAZOLAM HCL 2 MG/2ML IJ SOLN
INTRAMUSCULAR | Status: AC
  Filled 2022-05-14: qty 2

## 2022-05-14 MED ORDER — DEXAMETHASONE SODIUM PHOSPHATE 4 MG/ML IJ SOLN
INTRAMUSCULAR | Status: DC | PRN
  Administered 2022-05-14: 5 mg via INTRAVENOUS

## 2022-05-14 MED ORDER — ROCURONIUM BROMIDE 100 MG/10ML IV SOLN
INTRAVENOUS | Status: DC | PRN
  Administered 2022-05-14: 60 mg via INTRAVENOUS

## 2022-05-14 MED ORDER — ATROPINE SULFATE 0.4 MG/ML IV SOLN
INTRAVENOUS | Status: AC
  Filled 2022-05-14: qty 1

## 2022-05-14 MED ORDER — GABAPENTIN 300 MG PO CAPS
300.0000 mg | ORAL_CAPSULE | ORAL | Status: AC
  Administered 2022-05-14: 300 mg via ORAL

## 2022-05-14 MED ORDER — CEFAZOLIN SODIUM-DEXTROSE 2-4 GM/100ML-% IV SOLN
2.0000 g | INTRAVENOUS | Status: AC
  Administered 2022-05-14: 2 g via INTRAVENOUS

## 2022-05-14 MED ORDER — BUPIVACAINE HCL (PF) 0.25 % IJ SOLN
INTRAMUSCULAR | Status: DC | PRN
  Administered 2022-05-14: 30 mL

## 2022-05-14 MED ORDER — PHENYLEPHRINE 80 MCG/ML (10ML) SYRINGE FOR IV PUSH (FOR BLOOD PRESSURE SUPPORT)
PREFILLED_SYRINGE | INTRAVENOUS | Status: AC
  Filled 2022-05-14: qty 10

## 2022-05-14 MED ORDER — CEFAZOLIN SODIUM-DEXTROSE 2-4 GM/100ML-% IV SOLN
INTRAVENOUS | Status: AC
  Filled 2022-05-14: qty 100

## 2022-05-14 MED ORDER — LIDOCAINE 2% (20 MG/ML) 5 ML SYRINGE
INTRAMUSCULAR | Status: AC
  Filled 2022-05-14: qty 5

## 2022-05-14 MED ORDER — FENTANYL CITRATE (PF) 100 MCG/2ML IJ SOLN
INTRAMUSCULAR | Status: AC
  Filled 2022-05-14: qty 2

## 2022-05-14 MED ORDER — CELECOXIB 200 MG PO CAPS
200.0000 mg | ORAL_CAPSULE | ORAL | Status: AC
  Administered 2022-05-14: 200 mg via ORAL

## 2022-05-14 MED ORDER — HYDROMORPHONE HCL 1 MG/ML IJ SOLN
0.2500 mg | INTRAMUSCULAR | Status: DC | PRN
  Administered 2022-05-14: 0.5 mg via INTRAVENOUS

## 2022-05-14 MED ORDER — LIDOCAINE HCL (CARDIAC) PF 100 MG/5ML IV SOSY
PREFILLED_SYRINGE | INTRAVENOUS | Status: DC | PRN
  Administered 2022-05-14: 80 mg via INTRAVENOUS

## 2022-05-14 MED ORDER — OXYCODONE HCL 5 MG PO TABS
ORAL_TABLET | ORAL | Status: AC
  Filled 2022-05-14: qty 1

## 2022-05-14 MED ORDER — FENTANYL CITRATE (PF) 100 MCG/2ML IJ SOLN
INTRAMUSCULAR | Status: DC | PRN
  Administered 2022-05-14 (×3): 50 ug via INTRAVENOUS

## 2022-05-14 MED ORDER — SUGAMMADEX SODIUM 200 MG/2ML IV SOLN
INTRAVENOUS | Status: DC | PRN
  Administered 2022-05-14: 150 mg via INTRAVENOUS

## 2022-05-14 MED ORDER — CELECOXIB 200 MG PO CAPS
ORAL_CAPSULE | ORAL | Status: AC
  Filled 2022-05-14: qty 1

## 2022-05-14 MED ORDER — LACTATED RINGERS IV SOLN: INTRAVENOUS | Status: DC

## 2022-05-14 MED ORDER — BUPIVACAINE HCL (PF) 0.25 % IJ SOLN
INTRAMUSCULAR | Status: AC
  Filled 2022-05-14: qty 60

## 2022-05-14 MED ORDER — ONDANSETRON HCL 4 MG/2ML IJ SOLN
INTRAMUSCULAR | Status: AC
  Filled 2022-05-14: qty 2

## 2022-05-14 MED ORDER — GABAPENTIN 300 MG PO CAPS
ORAL_CAPSULE | ORAL | Status: AC
  Filled 2022-05-14: qty 1

## 2022-05-14 MED ORDER — OXYCODONE HCL 5 MG/5ML PO SOLN: 5.0000 mg | Freq: Once | ORAL | Status: AC | PRN

## 2022-05-14 MED ORDER — SODIUM CHLORIDE 0.9 % IV SOLN
INTRAVENOUS | Status: AC
  Filled 2022-05-14: qty 10

## 2022-05-14 MED ORDER — DROPERIDOL 2.5 MG/ML IJ SOLN
INTRAMUSCULAR | Status: DC | PRN
  Administered 2022-05-14: .625 mg via INTRAVENOUS

## 2022-05-14 MED ORDER — PROPOFOL 10 MG/ML IV BOLUS
INTRAVENOUS | Status: DC | PRN
  Administered 2022-05-14: 150 mg via INTRAVENOUS

## 2022-05-14 MED ORDER — HYDROMORPHONE HCL 1 MG/ML IJ SOLN
INTRAMUSCULAR | Status: AC
  Filled 2022-05-14: qty 0.5

## 2022-05-14 MED ORDER — ONDANSETRON HCL 4 MG/2ML IJ SOLN
INTRAMUSCULAR | Status: DC | PRN
  Administered 2022-05-14: 4 mg via INTRAVENOUS

## 2022-05-14 SURGICAL SUPPLY — 85 items
ADH SKN CLS APL DERMABOND .7 (GAUZE/BANDAGES/DRESSINGS) ×4
APL PRP STRL LF DISP 70% ISPRP (MISCELLANEOUS) ×2
BAG DECANTER FOR FLEXI CONT (MISCELLANEOUS) ×2 IMPLANT
BINDER BREAST LRG (GAUZE/BANDAGES/DRESSINGS) IMPLANT
BINDER BREAST MEDIUM (GAUZE/BANDAGES/DRESSINGS) IMPLANT
BINDER BREAST XLRG (GAUZE/BANDAGES/DRESSINGS) IMPLANT
BINDER BREAST XXLRG (GAUZE/BANDAGES/DRESSINGS) IMPLANT
BLADE SURG 10 STRL SS (BLADE) ×2 IMPLANT
BLADE SURG 15 STRL LF DISP TIS (BLADE) ×2 IMPLANT
BLADE SURG 15 STRL SS (BLADE) ×2
BNDG CMPR 5X62 HK CLSR LF (GAUZE/BANDAGES/DRESSINGS)
BNDG CMPR 6"X 5 YARDS HK CLSR (GAUZE/BANDAGES/DRESSINGS)
BNDG ELASTIC 6INX 5YD STR LF (GAUZE/BANDAGES/DRESSINGS) IMPLANT
BNDG GAUZE DERMACEA FLUFF 4 (GAUZE/BANDAGES/DRESSINGS) ×4 IMPLANT
BNDG GZE DERMACEA 4 6PLY (GAUZE/BANDAGES/DRESSINGS) ×4
CANISTER SUCT 1200ML W/VALVE (MISCELLANEOUS) ×2 IMPLANT
CHLORAPREP W/TINT 26 (MISCELLANEOUS) ×2 IMPLANT
COVER BACK TABLE 60X90IN (DRAPES) ×2 IMPLANT
COVER MAYO STAND STRL (DRAPES) ×2 IMPLANT
DERMABOND ADVANCED .7 DNX12 (GAUZE/BANDAGES/DRESSINGS) ×2 IMPLANT
DRAIN CHANNEL 15F RND FF W/TCR (WOUND CARE) ×2 IMPLANT
DRAPE INCISE IOBAN 66X45 STRL (DRAPES) IMPLANT
DRAPE TOP ARMCOVERS (MISCELLANEOUS) ×2 IMPLANT
DRAPE U-SHAPE 76X120 STRL (DRAPES) ×2 IMPLANT
DRAPE UTILITY XL STRL (DRAPES) ×2 IMPLANT
DRSG TEGADERM 4X10 (GAUZE/BANDAGES/DRESSINGS) IMPLANT
ELECT BLADE 4.0 EZ CLEAN MEGAD (MISCELLANEOUS) ×2
ELECT BLADE 6.5 EXT (BLADE) IMPLANT
ELECT COATED BLADE 2.86 ST (ELECTRODE) ×2 IMPLANT
ELECT REM PT RETURN 9FT ADLT (ELECTROSURGICAL) ×2
ELECTRODE BLDE 4.0 EZ CLN MEGD (MISCELLANEOUS) ×2 IMPLANT
ELECTRODE REM PT RTRN 9FT ADLT (ELECTROSURGICAL) ×2 IMPLANT
EVACUATOR SILICONE 100CC (DRAIN) ×2 IMPLANT
GAUZE PAD ABD 8X10 STRL (GAUZE/BANDAGES/DRESSINGS) ×4 IMPLANT
GAUZE SPONGE 4X4 12PLY STRL (GAUZE/BANDAGES/DRESSINGS) IMPLANT
GAUZE SPONGE 4X4 12PLY STRL LF (GAUZE/BANDAGES/DRESSINGS) IMPLANT
GLOVE BIO SURGEON STRL SZ 6 (GLOVE) ×2 IMPLANT
GLOVE BIOGEL PI IND STRL 6.5 (GLOVE) IMPLANT
GLOVE ECLIPSE 6.5 STRL STRAW (GLOVE) IMPLANT
GOWN STRL REUS W/ TWL LRG LVL3 (GOWN DISPOSABLE) ×4 IMPLANT
GOWN STRL REUS W/TWL LRG LVL3 (GOWN DISPOSABLE) ×10
IMPL BREAST P5.6XRND XFULL 375 (Breast) IMPLANT
IMPL BRST P5.6XRND XFULL 375CC (Breast) ×4 IMPLANT
IMPLANT BREAST GEL 375CC (Breast) ×4 IMPLANT
IV NS 500ML (IV SOLUTION)
IV NS 500ML BAXH (IV SOLUTION) ×2 IMPLANT
KIT FILL ASEPTIC TRANSFER (MISCELLANEOUS) IMPLANT
MARKER SKIN DUAL TIP RULER LAB (MISCELLANEOUS) IMPLANT
NDL FILTER BLUNT 18X1 1/2 (NEEDLE) IMPLANT
NDL HYPO 25X1 1.5 SAFETY (NEEDLE) IMPLANT
NEEDLE FILTER BLUNT 18X1 1/2 (NEEDLE) IMPLANT
NEEDLE HYPO 25X1 1.5 SAFETY (NEEDLE) ×2 IMPLANT
NS IRRIG 1000ML POUR BTL (IV SOLUTION) IMPLANT
PACK BASIN DAY SURGERY FS (CUSTOM PROCEDURE TRAY) ×2 IMPLANT
PENCIL SMOKE EVACUATOR (MISCELLANEOUS) ×2 IMPLANT
PIN SAFETY STERILE (MISCELLANEOUS) ×2 IMPLANT
SHEET MEDIUM DRAPE 40X70 STRL (DRAPES) ×2 IMPLANT
SIZER BREAST 375CC (SIZER) ×2
SIZER BRSTX STRL LF SIL 375CC (SIZER) IMPLANT
SLEEVE SCD COMPRESS KNEE MED (STOCKING) ×2 IMPLANT
SPIKE FLUID TRANSFER (MISCELLANEOUS) IMPLANT
SPONGE T-LAP 18X18 ~~LOC~~+RFID (SPONGE) ×4 IMPLANT
STAPLER VISISTAT 35W (STAPLE) ×2 IMPLANT
STRIP CLOSURE SKIN 1/2X4 (GAUZE/BANDAGES/DRESSINGS) IMPLANT
SUT ETHILON 2 0 FS 18 (SUTURE) IMPLANT
SUT MNCRL AB 4-0 PS2 18 (SUTURE) ×2 IMPLANT
SUT MON AB 5-0 P3 18 (SUTURE) IMPLANT
SUT PDS 3-0 CT2 (SUTURE)
SUT PDS AB 2-0 CT2 27 (SUTURE) IMPLANT
SUT PDS II 3-0 CT2 27 ABS (SUTURE) IMPLANT
SUT VIC AB 3-0 PS1 18 (SUTURE)
SUT VIC AB 3-0 PS1 18XBRD (SUTURE) ×2 IMPLANT
SUT VIC AB 3-0 SH 27 (SUTURE) ×2
SUT VIC AB 3-0 SH 27X BRD (SUTURE) IMPLANT
SUT VICRYL 4-0 PS2 18IN ABS (SUTURE) ×2 IMPLANT
SWAB COLLECTION DEVICE MRSA (MISCELLANEOUS) IMPLANT
SWAB CULTURE ESWAB REG 1ML (MISCELLANEOUS) IMPLANT
SYR 20ML LL LF (SYRINGE) IMPLANT
SYR 50ML LL SCALE MARK (SYRINGE) IMPLANT
SYR BULB IRRIG 60ML STRL (SYRINGE) ×2 IMPLANT
SYR CONTROL 10ML LL (SYRINGE) IMPLANT
TOWEL GREEN STERILE FF (TOWEL DISPOSABLE) ×4 IMPLANT
TUBE CONNECTING 20X1/4 (TUBING) ×2 IMPLANT
UNDERPAD 30X36 HEAVY ABSORB (UNDERPADS AND DIAPERS) ×4 IMPLANT
YANKAUER SUCT BULB TIP NO VENT (SUCTIONS) ×2 IMPLANT

## 2022-05-14 NOTE — Interval H&P Note (Signed)
History and Physical Interval Note:  05/14/2022 6:53 AM  Ellyce R Granato  has presented today for surgery, with the diagnosis of hx breast cancer, acquired absence breasts, mechanical complication breast implant.  The various methods of treatment have been discussed with the patient and family. After consideration of risks, benefits and other options for treatment, the patient has consented to  Procedure(s): REMOVAL BREAST IMPLANTS (Bilateral) RIGHT CAPSULECTOMY, POSSIBLE BILATERAL , REMOVAL IMPLANT MATERIAL RIGHT CHEST (Right) PLACEMENT OF BREAST IMPLANTS (Bilateral) as a surgical intervention.  The patient's history has been reviewed, patient examined, no change in status, stable for surgery.  I have reviewed the patient's chart and labs.  Questions were answered to the patient's satisfaction.     [name] Nyima Vanacker

## 2022-05-14 NOTE — Anesthesia Postprocedure Evaluation (Signed)
Anesthesia Post Note  Patient: Engineer, drilling  Procedure(s) Performed: REMOVAL BILATERAL BREAST IMPLANTS (Bilateral: Chest) RIGHT CAPSULECTOMY AND REMOVAL IMPLANT MATERIAL RIGHT CHEST (Right: Chest) PLACEMENT OF BILATERAL BREAST IMPLANTS (Bilateral: Chest)     Patient location during evaluation: PACU Anesthesia Type: General Level of consciousness: awake and alert Pain management: pain level controlled Vital Signs Assessment: post-procedure vital signs reviewed and stable Respiratory status: spontaneous breathing, nonlabored ventilation, respiratory function stable and patient connected to nasal cannula oxygen Cardiovascular status: blood pressure returned to baseline and stable Postop Assessment: no apparent nausea or vomiting Anesthetic complications: no  No notable events documented.  Last Vitals:  Vitals:   05/14/22 1045 05/14/22 1100  BP: (!) 145/89 (!) 138/91  Pulse: 85 82  Resp: 17 17  Temp:    SpO2: 100% 100%    Last Pain:  Vitals:   05/14/22 1057  TempSrc:   PainSc: 5                  Jr Milliron S

## 2022-05-14 NOTE — Discharge Instructions (Addendum)
  Post Anesthesia Home Care Instructions  Activity: Get plenty of rest for the remainder of the day. A responsible individual must stay with you for 24 hours following the procedure.  For the next 24 hours, DO NOT: -Drive a car -Advertising copywriter -Drink alcoholic beverages -Take any medication unless instructed by your physician -Make any legal decisions or sign important papers.  Meals: Start with liquid foods such as gelatin or soup. Progress to regular foods as tolerated. Avoid greasy, spicy, heavy foods. If nausea and/or vomiting occur, drink only clear liquids until the nausea and/or vomiting subsides. Call your physician if vomiting continues.  Special Instructions/Symptoms: Your throat may feel dry or sore from the anesthesia or the breathing tube placed in your throat during surgery. If this causes discomfort, gargle with warm salt water. The discomfort should disappear within 24 hours.  If you had a scopolamine patch placed behind your ear for the management of post- operative nausea and/or vomiting:  1. The medication in the patch is effective for 72 hours, after which it should be removed.  Wrap patch in a tissue and discard in the trash. Wash hands thoroughly with soap and water. 2. You may remove the patch earlier than 72 hours if you experience unpleasant side effects which may include dry mouth, dizziness or visual disturbances. 3. Avoid touching the patch. Wash your hands with soap and water after contact with the patch.      Next dose of Tylenol may be given at 12:30 if needed. Next dose of NSAID (Ibuprofen/Motrin/Aleve) mat be given at 2:30 if needed. Oxycodone for pain was last given today at 11:16 AM

## 2022-05-14 NOTE — Op Note (Addendum)
Operative Note   DATE OF OPERATION: 4.19.24  LOCATION: [facility]-outpatient  SURGICAL DIVISION: Plastic Surgery  PREOPERATIVE DIAGNOSES:  1. History breast cancer 2. Acquired absence breasts 3. Mechanical complication breast implant right  POSTOPERATIVE DIAGNOSES:  same  PROCEDURE:  1. Removal bilateral breast implants 2. Removal right implant material 3. Right capsulectomy 4. Placement silicone implant bilateral  SURGEON: Glenna Fellows MD MBA  ASSISTANT: none  ANESTHESIA:  General.   EBL: 20 ml  COMPLICATIONS: None immediate.   INDICATIONS FOR PROCEDURE:  The patient, Jaime Murillo, is a 62 y.o. female born on 1960-09-18, is here for treatment ruptured breast implant over right chest that presented as palpable mass axilla. Imaging work up consistent with free silicone axilla and right implant rupture noted. Patient has elected for bilateral implant replacement.   FINDINGS: Removed intact micro textured anatomic implant from left chest. No seroma noted within capsule and capsule thin. Over right chest, complete capsulectomy performed. Additional excision right axillary mass performed. Natrelle Soft Touch Extra Projection Smooth Round 375 ml implants placed bilateral. REF SSX-375 RIGHT SN [id number] LEFT SN [id number]  DESCRIPTION OF PROCEDURE:  The patient's operative site was marked with the patient in the preoperative area. The patient was taken to the operating room. SCDs were placed and IV antibiotics were given. The patient's operative site was prepped and draped in a sterile fashion. A time out was performed and all information was confirmed to be correct. Incision made in left inframammary fold scar and carried through superficial fascia to implant capsule. Capsule entered and intact implant removed. The capsule was noted to be thin and I left this in place. Capsulotomies performed superiorly and medially to accommodate anticipated new implant.   I then directed my  attention to right chest. Incision made in right inframammary fold scar and carried through superficial fascia to implant capsule. Dissection completed over implant capsule anteriorly, medially and laterally. Implant capsule entered and silicone implant and free silicone removed. Remainder capsule excised off chest wall. Cavity irrigated with saline. The palpable mass right axilla was not palpable through the implant cavity, and a separate incision made over this mass. The mass was excised. Closure of this incision completed with interrupted 3-0 vicryl in dermis, 4-0 monocryl subcuticular skin closure. Sizer placed in cavity. Patient brought to upright sitting position. A smooth round Extra Projection 375 ml implant selected for bilateral placement. Patient returned to supine position. Each breast cavity irrigated with saline solution containing Ancef gentamicin and Betadine. Hemostasis ensured. Local anesthetic infiltrated bilateral. 15 Fr JP placed in right chest cavity and secured with 2-0 nylon. An few millimeter area of very thin mastectomy flap following capsule removal superior to nipple areola complex was identified and excised sharply. This was closed primarily with 5-0 moncryl in dermis and simple running 5-0 monocryl skin closure. Interrupted 2-0 PDS suture placed from superficial fascia caudal to IMF incision to chest wall to stabilize inframammary fold. The implant was then placed in cavity. Orientation implant ensured. Closure completed with 3-0 vicryl in superficial fascia, 4-0 vicryl in dermis, and 4-0 monocryl subcuticular skin closure.  I then returned to left chest. Interrupted 2-0 PDS suture placed from superficial fascia caudal to IMF incision to chest wall to stabilize inframammary fold. The implant was then placed in cavity. Orientation implant ensured. Closure completed with 3-0 vicryl in superficial fascia, 4-0 vicryl in dermis, and 4-0 monocryl subcuticular skin closure. Dermabond applied  to all incisions. Tegaderms applied over mastectomy flaps. Dry dressing and breast  binder appled.  The patient was allowed to wake from anesthesia, extubated and taken to the recovery room in satisfactory condition.   SPECIMENS: right axillary mass, right chest capsule  DRAINS: 15 Fr JP in right breast reconstruction.  Glenna Fellows, MD [facility] Plastic & Reconstructive Surgery  Office/ physician access line after hours [phone]

## 2022-05-14 NOTE — Anesthesia Preprocedure Evaluation (Signed)
Anesthesia Evaluation  Patient identified by MRN, date of birth, ID band Patient awake    Reviewed: Allergy & Precautions, H&P , NPO status , Patient's Chart, lab work & pertinent test results  History of Anesthesia Complications (+) PONV and history of anesthetic complications  Airway Mallampati: II  TM Distance: >3 FB Neck ROM: Full    Dental no notable dental hx.    Pulmonary neg pulmonary ROS   Pulmonary exam normal breath sounds clear to auscultation       Cardiovascular hypertension, Normal cardiovascular exam Rhythm:Regular Rate:Normal     Neuro/Psych negative neurological ROS  negative psych ROS   GI/Hepatic negative GI ROS, Neg liver ROS,,,  Endo/Other  negative endocrine ROS    Renal/GU negative Renal ROS  negative genitourinary   Musculoskeletal negative musculoskeletal ROS (+)    Abdominal   Peds negative pediatric ROS (+)  Hematology negative hematology ROS (+)   Anesthesia Other Findings   Reproductive/Obstetrics negative OB ROS                             Anesthesia Physical Anesthesia Plan  ASA: 2  Anesthesia Plan: General   Post-op Pain Management: Celebrex PO (pre-op)* and Gabapentin PO (pre-op)*   Induction: Intravenous  PONV Risk Score and Plan: 4 or greater and Ondansetron, Dexamethasone, Droperidol, Treatment may vary due to age or medical condition and Scopolamine patch - Pre-op  Airway Management Planned: Oral ETT  Additional Equipment:   Intra-op Plan:   Post-operative Plan: Extubation in OR  Informed Consent: I have reviewed the patients History and Physical, chart, labs and discussed the procedure including the risks, benefits and alternatives for the proposed anesthesia with the patient or authorized representative who has indicated his/her understanding and acceptance.     Dental advisory given  Plan Discussed with: CRNA and  Surgeon  Anesthesia Plan Comments:        Anesthesia Quick Evaluation

## 2022-05-14 NOTE — Transfer of Care (Signed)
Immediate Anesthesia Transfer of Care Note  Patient: Jaime Murillo  Procedure(s) Performed: REMOVAL BILATERAL BREAST IMPLANTS (Bilateral: Chest) RIGHT CAPSULECTOMY AND REMOVAL IMPLANT MATERIAL RIGHT CHEST (Right: Chest) PLACEMENT OF BILATERAL BREAST IMPLANTS (Bilateral: Chest)  Patient Location: PACU  Anesthesia Type:General  Level of Consciousness: awake, drowsy, and patient cooperative  Airway & Oxygen Therapy: Patient Spontanous Breathing and Patient connected to face mask oxygen  Post-op Assessment: Report given to RN and Post -op Vital signs reviewed and stable  Post vital signs: Reviewed and stable  Last Vitals:  Vitals Value Taken Time  BP    Temp    Pulse 96 05/14/22 1034  Resp    SpO2 100 % 05/14/22 1034  Vitals shown include unvalidated device data.  Last Pain:  Vitals:   05/14/22 0634  TempSrc: Oral  PainSc: 0-No pain      Patients Stated Pain Goal: 4 (05/14/22 1610)  Complications: No notable events documented.

## 2022-05-14 NOTE — Anesthesia Procedure Notes (Signed)
Procedure Name: Intubation Date/Time: 05/14/2022 7:40 AM  Performed by: Ronnette Hila, CRNAPre-anesthesia Checklist: Patient identified, Emergency Drugs available, Suction available and Patient being monitored Patient Re-evaluated:Patient Re-evaluated prior to induction Oxygen Delivery Method: Circle system utilized Preoxygenation: Pre-oxygenation with 100% oxygen Induction Type: IV induction Ventilation: Mask ventilation without difficulty Laryngoscope Size: Mac and 3 Grade View: Grade I Tube type: Oral Tube size: 7.0 mm Number of attempts: 1 Airway Equipment and Method: Stylet and Oral airway Placement Confirmation: ETT inserted through vocal cords under direct vision, positive ETCO2 and breath sounds checked- equal and bilateral Secured at: 22 cm Tube secured with: Tape Dental Injury: Teeth and Oropharynx as per pre-operative assessment

## 2022-05-17 ENCOUNTER — Encounter (HOSPITAL_BASED_OUTPATIENT_CLINIC_OR_DEPARTMENT_OTHER): Payer: Self-pay | Admitting: Plastic Surgery

## 2022-05-18 LAB — SURGICAL PATHOLOGY

## 2022-05-25 ENCOUNTER — Other Ambulatory Visit: Payer: Self-pay | Admitting: Adult Health

## 2022-05-25 ENCOUNTER — Other Ambulatory Visit: Payer: Self-pay | Admitting: *Deleted

## 2022-05-25 DIAGNOSIS — C50311 Malignant neoplasm of lower-inner quadrant of right female breast: Secondary | ICD-10-CM

## 2022-06-04 ENCOUNTER — Telehealth: Payer: Self-pay | Admitting: Adult Health

## 2022-06-04 NOTE — Telephone Encounter (Signed)
Scheduled appointment per scheduling message. Patient is aware of the made appointment. 

## 2022-06-25 ENCOUNTER — Ambulatory Visit: Payer: 59 | Attending: Plastic Surgery | Admitting: Rehabilitation

## 2022-06-25 ENCOUNTER — Encounter: Payer: Self-pay | Admitting: Rehabilitation

## 2022-06-25 DIAGNOSIS — M25611 Stiffness of right shoulder, not elsewhere classified: Secondary | ICD-10-CM

## 2022-06-25 DIAGNOSIS — Z853 Personal history of malignant neoplasm of breast: Secondary | ICD-10-CM

## 2022-06-25 DIAGNOSIS — Z9189 Other specified personal risk factors, not elsewhere classified: Secondary | ICD-10-CM

## 2022-06-25 NOTE — Therapy (Signed)
OUTPATIENT PHYSICAL THERAPY  UPPER EXTREMITY ONCOLOGY EVALUATION  Patient Name: Jaime Murillo MRN: [id number] DOB:1960-10-24, 62 y.o., female Today's Date: 06/25/2022  END OF SESSION:  PT End of Session - 06/25/22 1054     Visit Number 1    Number of Visits 9    Date for PT Re-Evaluation 07/23/22    Authorization Type No Auth needed    PT Start Time 1000    PT Stop Time 1050    PT Time Calculation (min) 50 min    Activity Tolerance Patient tolerated treatment well    Behavior During Therapy [facility] for tasks assessed/performed             Past Medical History:  Diagnosis Date   Anemia    Anxiety    Depression    History of cancer chemotherapy 07-17-2012  to  12-04-2012   right breast cancer   HSV infection    Hypertension    Insomnia    Malignant neoplasm of lower-inner quadrant of right breast of female, estrogen receptor negative [facility]) oncologist-  dr Pamelia Hoit--  per lov note in epic ,  no recurrence   dx 05/ 2014---  right breast IDC & DCIS, Grade 3, triple negative----  completed neo-adjuvant chemo 12-04-2012,  03-20-2013  right total mastectomy with sln bx (07-24-2013  left simple mastectomy w/ sln bx)   Neuropathy associated with cancer (HCC)    related to chemotherapy   Neuropathy of both feet    DUE TO CHEMO PER PATIENT   Nocturia    PONV (postoperative nausea and vomiting)    shaking after anesthesia; had n/v after mastectomy   Urinary frequency    Wears glasses    Past Surgical History:  Procedure Laterality Date   AIKEN OSTEOTOMY Right 01/27/2018   Procedure: DOUBLE   Lesly Dukes;  Surgeon: Park Liter, DPM;  Location: [facility];  Service: Podiatry;  Laterality: Right;  POPLITEAL/SAPH BLOCK   BREAST IMPLANT REMOVAL Bilateral 05/14/2022   Procedure: REMOVAL BILATERAL BREAST IMPLANTS;  Surgeon: Glenna Fellows, MD;  Location: [facility];  Service: Plastics;  Laterality: Bilateral;   BREAST LUMPECTOMY Right  01/12/2013   Procedure: RIGHT SENTINEL LYMPH  NODE BIOPSY;  Surgeon: Almond Lint, MD;  Location: [facility];  Service: General;  Laterality: Right;  RIGHT SENTINEL LYMPH NODE BIOPSY   BREAST RECONSTRUCTION WITH PLACEMENT OF TISSUE EXPANDER AND FLEX HD (ACELLULAR HYDRATED DERMIS) Right 03/20/2013   Procedure: IMMEDIATE RIGHT BREAST RECONSTRUCTION WITH PLACEMENT OF TISSUE EXPANDER AND FLEX HD (ACELLULAR HYDRATED DERMIS);  Surgeon: Glenna Fellows, MD;  Location: [facility];  Service: Plastics;  Laterality: Right;   BREAST RECONSTRUCTION WITH PLACEMENT OF TISSUE EXPANDER AND FLEX HD (ACELLULAR HYDRATED DERMIS) Left 07/24/2013   Procedure: BREAST RECONSTRUCTION WITH PLACEMENT OF TISSUE EXPANDER AND FLEX HD (ACELLULAR HYDRATED DERMIS) TO THE LEFT BREAST;  Surgeon: Glenna Fellows, MD;  Location: [facility];  Service: Plastics;  Laterality: Left;   BREAST RECONSTRUCTION WITH PLACEMENT OF TISSUE EXPANDER AND FLEX HD (ACELLULAR HYDRATED DERMIS) Bilateral 01/07/2014   Procedure: BREAST RECONSTRUCTION WITH REMOVAL  OF TISSUE EXPANDER AND PLACEMENT  OF SILICONE IMPLANTS TO BILATERAL BREAST;  Surgeon: Glenna Fellows, MD;  Location: [facility] OR;  Service: Plastics;  Laterality: Bilateral;   CAPSULECTOMY Right 05/14/2022   Procedure: RIGHT CAPSULECTOMY AND REMOVAL IMPLANT MATERIAL RIGHT CHEST;  Surgeon: Glenna Fellows, MD;  Location: [facility];  Service: Plastics;  Laterality: Right;   COLONOSCOPY  LAPAROSCOPIC TUBAL LIGATION Bilateral 08-10-2001    dr Gaynell Face  @[facility]    with cauterization   PLACEMENT OF BREAST IMPLANTS Bilateral 05/14/2022   Procedure: PLACEMENT OF BILATERAL BREAST IMPLANTS;  Surgeon: Glenna Fellows, MD;  Location: [facility];  Service: Plastics;  Laterality: Bilateral;   PORT-A-CATH REMOVAL Left 01/12/2013   Procedure: REMOVAL PORT-A-CATH;  Surgeon: Almond Lint, MD;  Location: [facility];  Service: General;  Laterality:  Left;   PORTACATH PLACEMENT N/A 07/06/2012   Procedure: INSERTION PORT-A-CATH;  Surgeon: Almond Lint, MD;  Location: [facility];  Service: General;  Laterality: N/A;   SIMPLE MASTECTOMY WITH AXILLARY SENTINEL NODE BIOPSY Left 07/24/2013   Procedure: SIMPLE MASTECTOMY;  Surgeon: Almond Lint, MD;  Location: [facility];  Service: General;  Laterality: Left;   TOTAL MASTECTOMY Right 03/20/2013   Procedure: RIGHT SKIN SPARING TOTAL MASTECTOMY;  Surgeon: Almond Lint, MD;  Location: [facility];  Service: General;  Laterality: Right;   TRANSTHORACIC ECHOCARDIOGRAM  07/03/2012   ef 55-60%/  mild MR and TR/  mild dilated IVC   Patient Active Problem List   Diagnosis Date Noted   Women's annual routine gynecological examination 04/07/2020   History of breast cancer 04/07/2020   Screening examination for STD (sexually transmitted disease) 04/07/2020   Acquired absence of breast and nipple 01/07/2014   Personal history of breast cancer 07/11/2013   Fibroid uterus 07/04/2013   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   High blood pressure    Contact lens/glasses fitting    Malignant neoplasm of lower-inner quadrant of right breast of female, estrogen receptor negative (HCC) 06/23/2012   HSV (herpes simplex virus) anogenital infection 06/06/2012   Breast mass, right 06/06/2012   HTN (hypertension) 05/05/2011    PCP: Loura Back, NP  REFERRING PROVIDER: Dr. Leta Baptist  REFERRING DIAG: Post Mastectomy   THERAPY DIAG:  Personal history of breast cancer  At risk for lymphedema  Stiffness of right shoulder, not elsewhere classified  ONSET DATE:   Rationale for Evaluation and Treatment: Rehabilitation  SUBJECTIVE:                                                                                                                                                                                           SUBJECTIVE STATEMENT: I am still having back pain from the back surgery.   I just got muscle relaxers.  I go on a mission trip to [place] 7/3 and I need to be ready.  I feel like the outside is healing well but the pain is still there.  I don't have pain in the back just stiffness and discomfort.  PERTINENT HISTORY: Triple negative Rt breast cancer treated in 2014 with chemo-xrt and then Rt mastectomy with implant reconstruction and 3 neg nodes removed.  Then Lt risk reducing mastectomy with implant.  Palpated nodes in axilla 02/01/22 which showed possible Rt implant rupture. Removal and switch of implants 05/14/22.  Other hx: In February; 03/01/22. Discectomy and laminectomy in lumbar spine.   - no restrictions for back - [facility]: Dr. Val Eagle  PAIN:  Are you having pain? Yes NPRS scale: 8-9/10 Pain location: anterior chest Pain orientation: Rt>Lt PAIN TYPE: aching, sharp, and tight Pain description: intermittent  Aggravating factors:  get stiff after sitting to 1 hour Relieving factors: rest  PRECAUTIONS: Rt lymphedema risk  WEIGHT BEARING RESTRICTIONS: No  FALLS:  Has patient fallen in last 6 months? 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   OBSERVATIONS / OTHER ASSESSMENTS: Incisions well healed   POSTURE: WNL  UPPER EXTREMITY AROM/PROM:  A/PROM RIGHT   eval   Shoulder extension 50  Shoulder flexion 85 pn  Shoulder abduction   Shoulder internal rotation   Shoulder external rotation 70 pn    (Blank rows = not tested)  A/PROM LEFT    eval  Shoulder extension 35  Shoulder flexion 80  Shoulder abduction 80  Shoulder internal rotation   Shoulder external rotation     (Blank rows = not tested)  UPPER EXTREMITY STRENGTH: Not tested due to limited ROM  QUICK DASH SURVEY: 68%   TODAY'S TREATMENT:                                                                                                                                          DATE: 06/25/22 Eval performed PROM into flexion and abd x 5 each with work on Special educational needs teacher on post op HEP to start with addition of forward wall flexion due to bil status and sig guarding   PATIENT EDUCATION:  Education details: POC, initial HEP Person educated: Patient Education method: Programmer, multimedia, Facilities manager, Verbal cues, and Handouts Education comprehension: verbalized understanding  HOME EXERCISE PROGRAM: Post op  ASSESSMENT:  CLINICAL IMPRESSION: Patient is a 63 y.o. female who was seen today for physical therapy evaluation and treatment for her recent bil implant exchange due to rupture and recent lumbar laminectomy.  She was doing PT for her laminectomy and had to stop due to breast surgery and would  like to send her referral here to combine both together.  We will do this when the referral shows up.  She is very limited in ROM to below 90deg bilaterally with some fear of movement and having not stretched at all yet.  She has muscle spasm like pain in the Rt pectoralis but is healed very well.  Some increased edema in the Rt area and Rt pectoralis tightness.    OBJECTIVE IMPAIRMENTS: decreased activity tolerance, decreased mobility, decreased ROM, decreased strength, and increased edema.   ACTIVITY LIMITATIONS: carrying, lifting, sleeping, and reach over head  PARTICIPATION LIMITATIONS: cleaning, community activity, and occupation  PERSONAL FACTORS: 1 comorbidity: SLNB on Rt  are also affecting patient's functional outcome.   REHAB POTENTIAL: Excellent  CLINICAL  DECISION MAKING: Stable/uncomplicated  EVALUATION COMPLEXITY: Low  GOALS: Goals reviewed with patient? Yes  SHORT TERM GOALS=LTGs: Target date: 07/23/22  Pt will improve bil shoulder AROM to WNL to allow patient to return to all work and daily activities Baseline: Goal status: INITIAL  2.  Pt will be able to ride the bus to work carrying her things without limitations Baseline:  Goal status: INITIAL  3.  Pt will report feeling ready to go on her mission trip in regards to her shoulder and back  Baseline:  Goal status: INITIAL  PLAN:  PT FREQUENCY: 2x/week  PT DURATION: 4 weeks  PLANNED INTERVENTIONS: Therapeutic exercises, Therapeutic activity, Neuromuscular re-education, Patient/Family education, Self Care, Joint mobilization, Manual therapy, and Re-evaluation  PLAN FOR NEXT SESSION: bil shoulder AAROM/PROM, STM Rt pectoralis Lt?,  include some MLD for Rt region as needed. - eval back as referral comes back.   Idamae Lusher, PT 06/25/2022, 10:55 AM

## 2022-06-29 ENCOUNTER — Ambulatory Visit: Payer: 59 | Attending: Plastic Surgery

## 2022-06-29 DIAGNOSIS — Z9189 Other specified personal risk factors, not elsewhere classified: Secondary | ICD-10-CM | POA: Insufficient documentation

## 2022-06-29 DIAGNOSIS — M25611 Stiffness of right shoulder, not elsewhere classified: Secondary | ICD-10-CM | POA: Diagnosis present

## 2022-06-29 DIAGNOSIS — Z853 Personal history of malignant neoplasm of breast: Secondary | ICD-10-CM | POA: Insufficient documentation

## 2022-06-29 NOTE — Therapy (Signed)
OUTPATIENT PHYSICAL THERAPY  UPPER EXTREMITY ONCOLOGY TREATMENT  Patient Name: Jaime Murillo MRN: [id number] DOB:Jan 08, 1961, 62 y.o., female Today's Date: 06/29/2022  END OF SESSION:  PT End of Session - 06/29/22 1003     Visit Number 2    Number of Visits 9    Date for PT Re-Evaluation 07/23/22    Authorization Type No Auth needed    PT Start Time 1000    PT Stop Time 1103    PT Time Calculation (min) 63 min    Activity Tolerance Patient tolerated treatment well    Behavior During Therapy WFL for tasks assessed/performed             Past Medical History:  Diagnosis Date   Anemia    Anxiety    Depression    History of cancer chemotherapy 07-17-2012  to  12-04-2012   right breast cancer   HSV infection    Hypertension    Insomnia    Malignant neoplasm of lower-inner quadrant of right breast of female, estrogen receptor negative [facility]) oncologist-  dr Pamelia Hoit--  per lov note in epic ,  no recurrence   dx 05/ 2014---  right breast IDC & DCIS, Grade 3, triple negative----  completed neo-adjuvant chemo 12-04-2012,  03-20-2013  right total mastectomy with sln bx (07-24-2013  left simple mastectomy w/ sln bx)   Neuropathy associated with cancer (HCC)    related to chemotherapy   Neuropathy of both feet    DUE TO CHEMO PER PATIENT   Nocturia    PONV (postoperative nausea and vomiting)    shaking after anesthesia; had n/v after mastectomy   Urinary frequency    Wears glasses    Past Surgical History:  Procedure Laterality Date   AIKEN OSTEOTOMY Right 01/27/2018   Procedure: DOUBLE   Lesly Dukes;  Surgeon: Park Liter, DPM;  Location: [facility];  Service: Podiatry;  Laterality: Right;  POPLITEAL/SAPH BLOCK   BREAST IMPLANT REMOVAL Bilateral 05/14/2022   Procedure: REMOVAL BILATERAL BREAST IMPLANTS;  Surgeon: Glenna Fellows, MD;  Location: [facility];  Service: Plastics;  Laterality: Bilateral;   BREAST LUMPECTOMY Right 01/12/2013    Procedure: RIGHT SENTINEL LYMPH  NODE BIOPSY;  Surgeon: Almond Lint, MD;  Location: [facility];  Service: General;  Laterality: Right;  RIGHT SENTINEL LYMPH NODE BIOPSY   BREAST RECONSTRUCTION WITH PLACEMENT OF TISSUE EXPANDER AND FLEX HD (ACELLULAR HYDRATED DERMIS) Right 03/20/2013   Procedure: IMMEDIATE RIGHT BREAST RECONSTRUCTION WITH PLACEMENT OF TISSUE EXPANDER AND FLEX HD (ACELLULAR HYDRATED DERMIS);  Surgeon: Glenna Fellows, MD;  Location[facility];  Service: Plastics;  Laterality: Right;   BREAST RECONSTRUCTION WITH PLACEMENT OF TISSUE EXPANDER AND FLEX HD (ACELLULAR HYDRATED DERMIS) Left 07/24/2013   Procedure: BREAST RECONSTRUCTION WITH PLACEMENT OF TISSUE EXPANDER AND FLEX HD (ACELLULAR HYDRATED DERMIS) TO THE LEFT BREAST;  Surgeon: Glenna Fellows, MD;  Location: [facility];  Service: Plastics;  Laterality: Left;   BREAST RECONSTRUCTION WITH PLACEMENT OF TISSUE EXPANDER AND FLEX HD (ACELLULAR HYDRATED DERMIS) Bilateral 01/07/2014   Procedure: BREAST RECONSTRUCTION WITH REMOVAL  OF TISSUE EXPANDER AND PLACEMENT  OF SILICONE IMPLANTS TO BILATERAL BREAST;  Surgeon: Glenna Fellows, MD;  [facility]: [facility];  Service: Plastics;  Laterality: Bilateral;   CAPSULECTOMY Right 05/14/2022   Procedure: RIGHT CAPSULECTOMY AND REMOVAL IMPLANT MATERIAL RIGHT CHEST;  Surgeon: Glenna Fellows, MD;  Location:  SURGERY CENTER;  Service: Plastics;  Laterality: Right;   COLONOSCOPY  LAPAROSCOPIC TUBAL LIGATION Bilateral 08-10-2001    dr Gaynell Face  @[facility]    with cauterization   PLACEMENT OF BREAST IMPLANTS Bilateral 05/14/2022   Procedure: PLACEMENT OF BILATERAL BREAST IMPLANTS;  Surgeon: Glenna Fellows, MD;  Location: [facility];  Service: Plastics;  Laterality: Bilateral;   PORT-A-CATH REMOVAL Left 01/12/2013   Procedure: REMOVAL PORT-A-CATH;  Surgeon: Almond Lint, MD;  Location: [facility];  Service: General;  Laterality: Left;    PORTACATH PLACEMENT N/A 07/06/2012   Procedure: INSERTION PORT-A-CATH;  Surgeon: Almond Lint, MD;  Location: [facility];  Service: General;  Laterality: N/A;   SIMPLE MASTECTOMY WITH AXILLARY SENTINEL NODE BIOPSY Left 07/24/2013   Procedure: SIMPLE MASTECTOMY;  Surgeon: Almond Lint, MD;  Location: [facility];  Service: General;  Laterality: Left;   TOTAL MASTECTOMY Right 03/20/2013   Procedure: RIGHT SKIN SPARING TOTAL MASTECTOMY;  Surgeon: Almond Lint, MD;  Location: [facility];  Service: General;  Laterality: Right;   TRANSTHORACIC ECHOCARDIOGRAM  07/03/2012   ef 55-60%/  mild MR and TR/  mild dilated IVC   Patient Active Problem List   Diagnosis Date Noted   Women's annual routine gynecological examination 04/07/2020   History of breast cancer 04/07/2020   Screening examination for STD (sexually transmitted disease) 04/07/2020   Acquired absence of breast and nipple 01/07/2014   Personal history of breast cancer 07/11/2013   Fibroid uterus 07/04/2013   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   High blood pressure    Contact lens/glasses fitting    Malignant neoplasm of lower-inner quadrant of right breast of female, estrogen receptor negative (HCC) 06/23/2012   HSV (herpes simplex virus) anogenital infection 06/06/2012   Breast mass, right 06/06/2012   HTN (hypertension) 05/05/2011    PCP: Loura Back, NP  REFERRING PROVIDER: Dr. Leta Baptist  REFERRING DIAG: Post Mastectomy   THERAPY DIAG:  Personal history of breast cancer  At risk for lymphedema  Stiffness of right shoulder, not elsewhere classified  ONSET DATE:   Rationale for Evaluation and Treatment: Rehabilitation  SUBJECTIVE:                                                                                                                                                                                           SUBJECTIVE STATEMENT: I did the stretches she gave me last time twice since I was  here last. They were okay, just had some sharp pain under my Lt>Rt breast. I also started walking this morning and I'm going to keep doing that as well.   PERTINENT HISTORY: Triple negative Rt breast cancer treated in 2014 with chemo-xrt and then Rt  mastectomy with implant reconstruction and 3 neg nodes removed.  Then Lt risk reducing mastectomy with implant.  Palpated nodes in axilla 02/01/22 which showed possible Rt implant rupture. Removal and switch of implants 05/14/22.  Other hx: In February; 03/01/22. Discectomy and laminectomy in lumbar spine.   - no restrictions for back - [facility]: Dr. Val Eagle  PAIN:  Are you having pain? Yes NPRS scale: 2/10 Pain location: anterior chest Pain orientation: Rt>Lt PAIN TYPE: sharp but more discomfort Pain description: intermittent  Aggravating factors:  with new HEP Relieving factors: rest  PRECAUTIONS: Rt lymphedema risk  WEIGHT BEARING RESTRICTIONS: No  FALLS:  Has patient fallen in last 6 months? 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   OBSERVATIONS / OTHER ASSESSMENTS: Incisions well healed   POSTURE: WNL  UPPER EXTREMITY AROM/PROM:  A/PROM RIGHT   eval   Shoulder extension 50  Shoulder flexion 85 pn  Shoulder abduction   Shoulder internal rotation   Shoulder external rotation 70 pn    (Blank rows = not tested)  A/PROM LEFT   eval  Shoulder extension 35  Shoulder flexion 80  Shoulder abduction 80  Shoulder internal  rotation   Shoulder external rotation     (Blank rows = not tested)  UPPER EXTREMITY STRENGTH: Not tested due to limited ROM  QUICK DASH SURVEY: 68%   TODAY'S TREATMENT:                                                                                                                                          DATE:  06/29/22: Manual Therapy P/ROM to bil shoulders in supine into flexion, abd and D2 to pts available end motions. Multiple VC's during to relax due to muscle guarding.  STM to bil pectoralis insertions during P/ROM where pt palpably tight; also with coca butter to medial scapular borders where trigger points palpable Scap Mobs in S/L (done bil) into protraction and retraction with multiple VC's to relax  06/25/22: Eval performed PROM into flexion and abd x 5 each with work on Special educational needs teacher on post op HEP to start with addition of forward wall flexion due to bil status and sig guarding   PATIENT EDUCATION:  Education details: POC, initial HEP Person educated: Patient Education method: Programmer, multimedia, Facilities manager, Verbal cues, and Handouts Education comprehension: verbalized understanding  HOME EXERCISE PROGRAM: Post op  ASSESSMENT:  CLINICAL IMPRESSION: Pt tolerated first session of manual therapy very well. She did require multiple VC's during manual therapy to relax but was able to with cuing. After session she reported feeling looser in bil shoulders.   OBJECTIVE IMPAIRMENTS: decreased activity tolerance, decreased mobility, decreased ROM, decreased strength, and increased edema.   ACTIVITY LIMITATIONS: carrying, lifting, sleeping, and reach over head  PARTICIPATION LIMITATIONS: cleaning, community activity, and occupation  PERSONAL FACTORS: 1 comorbidity: SLNB on Rt  are also affecting patient's functional outcome.   REHAB POTENTIAL: Excellent  CLINICAL DECISION MAKING: Stable/uncomplicated  EVALUATION COMPLEXITY: Low  GOALS: Goals reviewed with  patient? Yes  SHORT TERM GOALS=LTGs: Target date: 07/23/22  Pt will improve bil shoulder AROM to WNL to allow patient to return to all work and daily activities Baseline: Goal status: INITIAL  2.  Pt will be able to ride the bus to work carrying her things without limitations Baseline:  Goal status: INITIAL  3.  Pt will report feeling ready to go on her mission trip in regards to her shoulder and back  Baseline:  Goal status: INITIAL  PLAN:  PT FREQUENCY: 2x/week  PT DURATION: 4 weeks  PLANNED INTERVENTIONS: Therapeutic exercises, Therapeutic activity, Neuromuscular re-education, Patient/Family education, Self Care, Joint mobilization, Manual therapy, and Re-evaluation  PLAN FOR NEXT SESSION: bil shoulder AAROM/PROM, STM Rt pectoralis Lt?,  include some MLD for Rt region as needed. - eval back as referral comes back.   Hermenia Bers, PTA 06/29/2022, 1:04 PM

## 2022-07-07 ENCOUNTER — Encounter: Payer: Self-pay | Admitting: Rehabilitation

## 2022-07-07 ENCOUNTER — Ambulatory Visit: Payer: 59 | Admitting: Rehabilitation

## 2022-07-07 DIAGNOSIS — Z853 Personal history of malignant neoplasm of breast: Secondary | ICD-10-CM

## 2022-07-07 DIAGNOSIS — Z9189 Other specified personal risk factors, not elsewhere classified: Secondary | ICD-10-CM

## 2022-07-07 DIAGNOSIS — M25611 Stiffness of right shoulder, not elsewhere classified: Secondary | ICD-10-CM

## 2022-07-07 NOTE — Therapy (Signed)
OUTPATIENT PHYSICAL THERAPY  UPPER EXTREMITY ONCOLOGY TREATMENT  Patient Name: Jaime Murillo MRN: [id number] DOB:August 18, 1960, 62 y.o., female Today's Date: 07/07/2022  END OF SESSION:  PT End of Session - 07/07/22 1055     Visit Number 3    Number of Visits 9    Date for PT Re-Evaluation 07/23/22    PT Start Time 1100    PT Stop Time 1148    PT Time Calculation (min) 48 min    Activity Tolerance Patient tolerated treatment well    Behavior During Therapy [facility] for tasks assessed/performed              Past Medical History:  Diagnosis Date   Anemia    Anxiety    Depression    History of cancer chemotherapy 07-17-2012  to  12-04-2012   right breast cancer   HSV infection    Hypertension    Insomnia    Malignant neoplasm of lower-inner quadrant of right breast of female, estrogen receptor negative [facility]) oncologist-  dr Pamelia Hoit--  per lov note in epic ,  no recurrence   dx 05/ 2014---  right breast IDC & DCIS, Grade 3, triple negative----  completed neo-adjuvant chemo 12-04-2012,  03-20-2013  right total mastectomy with sln bx (07-24-2013  left simple mastectomy w/ sln bx)   Neuropathy associated with cancer (HCC)    related to chemotherapy   Neuropathy of both feet    DUE TO CHEMO PER PATIENT   Nocturia    PONV (postoperative nausea and vomiting)    shaking after anesthesia; had n/v after mastectomy   Urinary frequency    Wears glasses    Past Surgical History:  Procedure Laterality Date   AIKEN OSTEOTOMY Right 01/27/2018   Procedure: DOUBLE   Lesly Dukes;  Surgeon: Park Liter, DPM;  Location: [facility];  Service: Podiatry;  Laterality: Right;  POPLITEAL/SAPH BLOCK   BREAST IMPLANT REMOVAL Bilateral 05/14/2022   Procedure: REMOVAL BILATERAL BREAST IMPLANTS;  Surgeon: Glenna Fellows, MD;  Location: [facility];  Service: Plastics;  Laterality: Bilateral;   BREAST LUMPECTOMY Right 01/12/2013   Procedure: RIGHT SENTINEL LYMPH   NODE BIOPSY;  Surgeon: Almond Lint, MD;  Location: [facility];  Service: General;  Laterality: Right;  RIGHT SENTINEL LYMPH NODE BIOPSY   BREAST RECONSTRUCTION WITH PLACEMENT OF TISSUE EXPANDER AND FLEX HD (ACELLULAR HYDRATED DERMIS) Right 03/20/2013   Procedure: IMMEDIATE RIGHT BREAST RECONSTRUCTION WITH PLACEMENT OF TISSUE EXPANDER AND FLEX HD (ACELLULAR HYDRATED DERMIS);  Surgeon: Glenna Fellows, MD;  Location[facility];  Service: Plastics;  Laterality: Right;   BREAST RECONSTRUCTION WITH PLACEMENT OF TISSUE EXPANDER AND FLEX HD (ACELLULAR HYDRATED DERMIS) Left 07/24/2013   Procedure: BREAST RECONSTRUCTION WITH PLACEMENT OF TISSUE EXPANDER AND FLEX HD (ACELLULAR HYDRATED DERMIS) TO THE LEFT BREAST;  Surgeon: Glenna Fellows, MD;  Location: [facility];  Service: Plastics;  Laterality: Left;   BREAST RECONSTRUCTION WITH PLACEMENT OF TISSUE EXPANDER AND FLEX HD (ACELLULAR HYDRATED DERMIS) Bilateral 01/07/2014   Procedure: BREAST RECONSTRUCTION WITH REMOVAL  OF TISSUE EXPANDER AND PLACEMENT  OF SILICONE IMPLANTS TO BILATERAL BREAST;  Surgeon: Glenna Fellows, MD;  Location: [facility];  Service: Plastics;  Laterality: Bilateral;   CAPSULECTOMY Right 05/14/2022   Procedure: RIGHT CAPSULECTOMY AND REMOVAL IMPLANT MATERIAL RIGHT CHEST;  Surgeon: Glenna Fellows, MD;  Location: [facility];  Service: Plastics;  Laterality: Right;   COLONOSCOPY     LAPAROSCOPIC TUBAL LIGATION Bilateral 08-10-2001  dr Gaynell Face  @[facility]    with cauterization   PLACEMENT OF BREAST IMPLANTS Bilateral 05/14/2022   Procedure: PLACEMENT OF BILATERAL BREAST IMPLANTS;  Surgeon: Glenna Fellows, MD;  Location: [facility];  Service: Plastics;  Laterality: Bilateral;   PORT-A-CATH REMOVAL Left 01/12/2013   Procedure: REMOVAL PORT-A-CATH;  Surgeon: Almond Lint, MD;  Location: [facility];  Service: General;  Laterality: Left;   PORTACATH PLACEMENT N/A 07/06/2012    Procedure: INSERTION PORT-A-CATH;  Surgeon: Almond Lint, MD;  Location: [facility];  Service: General;  Laterality: N/A;   SIMPLE MASTECTOMY WITH AXILLARY SENTINEL NODE BIOPSY Left 07/24/2013   Procedure: SIMPLE MASTECTOMY;  Surgeon: Almond Lint, MD;  Location: [facility];  Service: General;  Laterality: Left;   TOTAL MASTECTOMY Right 03/20/2013   Procedure: RIGHT SKIN SPARING TOTAL MASTECTOMY;  Surgeon: Almond Lint, MD;  Location: [facility];  Service: General;  Laterality: Right;   TRANSTHORACIC ECHOCARDIOGRAM  07/03/2012   ef 55-60%/  mild MR and TR/  mild dilated IVC   Patient Active Problem List   Diagnosis Date Noted   Women's annual routine gynecological examination 04/07/2020   History of breast cancer 04/07/2020   Screening examination for STD (sexually transmitted disease) 04/07/2020   Acquired absence of breast and nipple 01/07/2014   Personal history of breast cancer 07/11/2013   Fibroid uterus 07/04/2013   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   High blood pressure    Contact lens/glasses fitting    Malignant neoplasm of lower-inner quadrant of right breast of female, estrogen receptor negative (HCC) 06/23/2012   HSV (herpes simplex virus) anogenital infection 06/06/2012   Breast mass, right 06/06/2012   HTN (hypertension) 05/05/2011    PCP: Loura Back, NP  REFERRING PROVIDER: Dr. Leta Baptist  REFERRING DIAG: Post Mastectomy   THERAPY DIAG:  Personal history of breast cancer  Stiffness of right shoulder, not elsewhere classified  At risk for lymphedema  ONSET DATE:   Rationale for Evaluation and Treatment: Rehabilitation  SUBJECTIVE:                                                                                                                                                                                           SUBJECTIVE STATEMENT:  I flew to [place] and am feeling pretty good.  I tried carrying my roll on in the Rt hand and it hurt  test.  It felt sore in the chest and the back.    PERTINENT HISTORY: Triple negative Rt breast cancer treated in 2014 with chemo-xrt and then Rt mastectomy with implant reconstruction and 3 neg nodes removed.  Then Lt risk reducing  mastectomy with implant.  Palpated nodes in axilla 02/01/22 which showed possible Rt implant rupture. Removal and switch of implants 05/14/22.  Other hx: In February; 03/01/22. Discectomy and laminectomy in lumbar spine.   - no restrictions for back - [facility]: Dr. Val Eagle  PAIN:  Are you having pain? Yes NPRS scale: 2/10 Pain location: anterior chest Pain orientation: Rt>Lt PAIN TYPE: sharp but more discomfort Pain description: intermittent  Aggravating factors:  with new HEP Relieving factors: rest  PRECAUTIONS: Rt lymphedema risk  WEIGHT BEARING RESTRICTIONS: No  FALLS:  Has patient fallen in last 6 months? 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   OBSERVATIONS / OTHER ASSESSMENTS: Incisions well healed   POSTURE: WNL  UPPER EXTREMITY AROM/PROM:  A/PROM RIGHT   eval  07/07/22  Shoulder extension 50 50  Shoulder flexion 85 pn 110   Shoulder abduction 70 105  Shoulder internal rotation    Shoulder external rotation      (Blank rows = not tested)  A/PROM LEFT   eval 07/07/22  Shoulder extension 35 42  Shoulder flexion 80 105  Shoulder abduction 80 90  Shoulder internal rotation    Shoulder external rotation      (Blank rows = not  tested)  UPPER EXTREMITY STRENGTH: Not tested due to limited ROM  QUICK DASH SURVEY: 68%   TODAY'S TREATMENT:                                                                                                                                          DATE:  07/07/22: TE: Pulleys into flexion and abduction x each with cueing - pt reports elbow due to guarding Supine dowel chest press and flexion x 8 with guarding and encouragement for relaxation needed Supine chest stretch alternating AROM  Manual Therapy P/ROM to bil shoulders in supine into flexion, abd and D2 to pts available end motions. Multiple VC's during to relax due to muscle guarding.  STM to bil pectoralis insertions during P/ROM where pt palpably tight; also with coca butter to medial scapular borders where trigger points palpable Scap Mobs in S/L (done bil) into protraction and retraction with multiple VC's to relax 06/29/22: Manual Therapy P/ROM to bil shoulders in supine into flexion, abd and D2 to pts available end motions. Multiple VC's during to relax due to muscle guarding.  STM to bil pectoralis  insertions during P/ROM where pt palpably tight;   06/25/22: Eval performed PROM into flexion and abd x 5 each with work on Special educational needs teacher on post op HEP to start with addition of forward wall flexion due to bil status and sig guarding   PATIENT EDUCATION:  Education details: POC, initial HEP Person educated: Patient Education method: Programmer, multimedia, Facilities manager, Verbal cues, and Handouts Education comprehension: verbalized understanding  HOME EXERCISE PROGRAM: Post op Supine dowel flexion or clasped hands   ASSESSMENT:  CLINICAL IMPRESSION: Pt is showing improved AROM but still very limited and guarded.  Will continue POC.    OBJECTIVE IMPAIRMENTS: decreased activity tolerance, decreased mobility, decreased ROM, decreased strength, and increased edema.   ACTIVITY LIMITATIONS: carrying, lifting, sleeping, and  reach over head  PARTICIPATION LIMITATIONS: cleaning, community activity, and occupation  PERSONAL FACTORS: 1 comorbidity: SLNB on Rt  are also affecting patient's functional outcome.   REHAB POTENTIAL: Excellent  CLINICAL DECISION MAKING: Stable/uncomplicated  EVALUATION COMPLEXITY: Low  GOALS: Goals reviewed with patient? Yes  SHORT TERM GOALS=LTGs: Target date: 07/23/22  Pt will improve bil shoulder AROM to WNL to allow patient to return to all work and daily activities Baseline: Goal status: INITIAL  2.  Pt will be able to ride the bus to work carrying her things without limitations Baseline:  Goal status: INITIAL  3.  Pt will report feeling ready to go on her mission trip in regards to her shoulder and back  Baseline:  Goal status: INITIAL  PLAN:  PT FREQUENCY: 2x/week  PT DURATION: 4 weeks  PLANNED INTERVENTIONS: Therapeutic exercises, Therapeutic activity, Neuromuscular re-education, Patient/Family education, Self Care, Joint mobilization, Manual therapy, and Re-evaluation  PLAN FOR NEXT SESSION: bil shoulder AAROM/PROM, STM Rt pectoralis Lt?,  include some MLD for Rt region as needed. - eval back as referral comes back.   Idamae Lusher, PT 07/07/2022, 11:48 AM

## 2022-07-09 ENCOUNTER — Ambulatory Visit: Payer: 59

## 2022-07-09 DIAGNOSIS — M25611 Stiffness of right shoulder, not elsewhere classified: Secondary | ICD-10-CM

## 2022-07-09 DIAGNOSIS — Z853 Personal history of malignant neoplasm of breast: Secondary | ICD-10-CM | POA: Diagnosis not present

## 2022-07-09 DIAGNOSIS — Z9189 Other specified personal risk factors, not elsewhere classified: Secondary | ICD-10-CM

## 2022-07-09 NOTE — Therapy (Signed)
OUTPATIENT PHYSICAL THERAPY  UPPER EXTREMITY ONCOLOGY TREATMENT  Patient Name: Jaime Murillo MRN: [id number] DOB:06-Aug-1960, 62 y.o., female Today's Date: 07/09/2022  END OF SESSION:  PT End of Session - 07/09/22 0805     Visit Number 4    Number of Visits 9    Date for PT Re-Evaluation 07/23/22    Authorization Type No Auth needed    PT Start Time 0808    PT Stop Time 0902    PT Time Calculation (min) 54 min    Activity Tolerance Patient tolerated treatment well    Behavior During Therapy [facility] for tasks assessed/performed              Past Medical History:  Diagnosis Date   Anemia    Anxiety    Depression    History of cancer chemotherapy 07-17-2012  to  12-04-2012   right breast cancer   HSV infection    Hypertension    Insomnia    Malignant neoplasm of lower-inner quadrant of right breast of female, estrogen receptor negative [facility]) oncologist-  dr Pamelia Hoit--  per lov note in epic ,  no recurrence   dx 05/ 2014---  right breast IDC & DCIS, Grade 3, triple negative----  completed neo-adjuvant chemo 12-04-2012,  03-20-2013  right total mastectomy with sln bx (07-24-2013  left simple mastectomy w/ sln bx)   Neuropathy associated with cancer (HCC)    related to chemotherapy   Neuropathy of both feet    DUE TO CHEMO PER PATIENT   Nocturia    PONV (postoperative nausea and vomiting)    shaking after anesthesia; had n/v after mastectomy   Urinary frequency    Wears glasses    Past Surgical History:  Procedure Laterality Date   AIKEN OSTEOTOMY Right 01/27/2018   Procedure: DOUBLE   Lesly Dukes;  Surgeon: Park Liter, DPM;  Location: [facility];  Service: Podiatry;  Laterality: Right;  POPLITEAL/SAPH BLOCK   BREAST IMPLANT REMOVAL Bilateral 05/14/2022   Procedure: REMOVAL BILATERAL BREAST IMPLANTS;  Surgeon: Glenna Fellows, MD;  Location: [facility];  Service: Plastics;  Laterality: Bilateral;   BREAST LUMPECTOMY Right  01/12/2013   Procedure: RIGHT SENTINEL LYMPH  NODE BIOPSY;  Surgeon: Almond Lint, MD;  Location: [facility];  Service: General;  Laterality: Right;  RIGHT SENTINEL LYMPH NODE BIOPSY   BREAST RECONSTRUCTION WITH PLACEMENT OF TISSUE EXPANDER AND FLEX HD (ACELLULAR HYDRATED DERMIS) Right 03/20/2013   Procedure: IMMEDIATE RIGHT BREAST RECONSTRUCTION WITH PLACEMENT OF TISSUE EXPANDER AND FLEX HD (ACELLULAR HYDRATED DERMIS);  Surgeon: Glenna Fellows, MD;  Location: [facility];  Service: Plastics;  Laterality: Right;   BREAST RECONSTRUCTION WITH PLACEMENT OF TISSUE EXPANDER AND FLEX HD (ACELLULAR HYDRATED DERMIS) Left 07/24/2013   Procedure: BREAST RECONSTRUCTION WITH PLACEMENT OF TISSUE EXPANDER AND FLEX HD (ACELLULAR HYDRATED DERMIS) TO THE LEFT BREAST;  Surgeon: Glenna Fellows, MD;  Location: [facility];  Service: Plastics;  Laterality: Left;   BREAST RECONSTRUCTION WITH PLACEMENT OF TISSUE EXPANDER AND FLEX HD (ACELLULAR HYDRATED DERMIS) Bilateral 01/07/2014   Procedure: BREAST RECONSTRUCTION WITH REMOVAL  OF TISSUE EXPANDER AND PLACEMENT  OF SILICONE IMPLANTS TO BILATERAL BREAST;  Surgeon: Glenna Fellows, MD;  Location: [facility];  Service: Plastics;  Laterality: Bilateral;   CAPSULECTOMY Right 05/14/2022   Procedure: RIGHT CAPSULECTOMY AND REMOVAL IMPLANT MATERIAL RIGHT CHEST;  Surgeon: Glenna Fellows, MD;  Location: [facility];  Service: Plastics;  Laterality: Right;   COLONOSCOPY  LAPAROSCOPIC TUBAL LIGATION Bilateral 08-10-2001    dr Gaynell Face  @[facility]    with cauterization   PLACEMENT OF BREAST IMPLANTS Bilateral 05/14/2022   Procedure: PLACEMENT OF BILATERAL BREAST IMPLANTS;  Surgeon: Glenna Fellows, MD;  Location: [facility];  Service: Plastics;  Laterality: Bilateral;   PORT-A-CATH REMOVAL Left 01/12/2013   Procedure: REMOVAL PORT-A-CATH;  Surgeon: Almond Lint, MD;  Location: [facility];  Service: General;  Laterality:  Left;   PORTACATH PLACEMENT N/A 07/06/2012   Procedure: INSERTION PORT-A-CATH;  Surgeon: Almond Lint, MD;  Location: [facility];  Service: General;  Laterality: N/A;   SIMPLE MASTECTOMY WITH AXILLARY SENTINEL NODE BIOPSY Left 07/24/2013   Procedure: SIMPLE MASTECTOMY;  Surgeon: Almond Lint, MD;  Location: [facility];  Service: General;  Laterality: Left;   TOTAL MASTECTOMY Right 03/20/2013   Procedure: RIGHT SKIN SPARING TOTAL MASTECTOMY;  Surgeon: Almond Lint, MD;  Location: [facility];  Service: General;  Laterality: Right;   TRANSTHORACIC ECHOCARDIOGRAM  07/03/2012   ef 55-60%/  mild MR and TR/  mild dilated IVC   Patient Active Problem List   Diagnosis Date Noted   Women's annual routine gynecological examination 04/07/2020   History of breast cancer 04/07/2020   Screening examination for STD (sexually transmitted disease) 04/07/2020   Acquired absence of breast and nipple 01/07/2014   Personal history of breast cancer 07/11/2013   Fibroid uterus 07/04/2013   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   High blood pressure    Contact lens/glasses fitting    Malignant neoplasm of lower-inner quadrant of right breast of female, estrogen receptor negative (HCC) 06/23/2012   HSV (herpes simplex virus) anogenital infection 06/06/2012   Breast mass, right 06/06/2012   HTN (hypertension) 05/05/2011    PCP: Loura Back, NP  REFERRING PROVIDER: Dr. Leta Baptist  REFERRING DIAG: Post Mastectomy   THERAPY DIAG:  Personal history of breast cancer  Stiffness of right shoulder, not elsewhere classified  At risk for lymphedema  ONSET DATE:   Rationale for Evaluation and Treatment: Rehabilitation  SUBJECTIVE:                                                                                                                                                                                           SUBJECTIVE STATEMENT:  When I was in the shower last night I thought it  looked like my Rt breast was drooping and I hadn't noticed that before. Still feeling stiff across my chest and back.     PERTINENT HISTORY: Triple negative Rt breast cancer treated in 2014 with chemo-xrt and then Rt mastectomy with implant reconstruction and 3 neg  nodes removed.  Then Lt risk reducing mastectomy with implant.  Palpated nodes in axilla 02/01/22 which showed possible Rt implant rupture. Removal and switch of implants 05/14/22.  Other hx: In February; 03/01/22. Discectomy and laminectomy in lumbar spine.   - no restrictions for back - [facility]: Dr. Val Eagle  PAIN:  Are you having pain? Yes NPRS scale: 7/10 Pain location: across chest and back Pain orientation: Rt>Lt PAIN TYPE: stiff Pain description: intermittent  Aggravating factors:  with new HEP Relieving factors: rest, hot shower  PRECAUTIONS: Rt lymphedema risk  WEIGHT BEARING RESTRICTIONS: No  FALLS:  Has patient fallen in last 6 months? 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   OBSERVATIONS / OTHER ASSESSMENTS: Incisions well healed   POSTURE: WNL  UPPER EXTREMITY AROM/PROM:  A/PROM RIGHT   eval  07/07/22  Shoulder extension 50 50  Shoulder flexion 85 pn 110   Shoulder abduction 70 105  Shoulder internal rotation    Shoulder external rotation      (Blank rows = not tested)  A/PROM LEFT   eval 07/07/22  Shoulder extension 35 42  Shoulder flexion 80 105  Shoulder abduction 80 90  Shoulder  internal rotation    Shoulder external rotation      (Blank rows = not tested)  UPPER EXTREMITY STRENGTH: Not tested due to limited ROM  QUICK DASH SURVEY: 68%   TODAY'S TREATMENT:                                                                                                                                          DATE:  07/09/22: Therapeutic Exercises Pulleys into flexion and abduction x each with cueing - pt reports still feeling pull into elbow but less guarding Roll yellow ball up wall into flexion x 10 and then bil abd x 5 each with VC's for correct technique Supine over half foam roll for following: Horz abd x7, stopped due to reporting increased low back discomfort and then "snow angel" for bil abd x4 then stopped due to increased Lt axillary pain Manual Therapy   07/07/22: TE: Pulleys into flexion and abduction x each with cueing - pt reports elbow due to guarding Supine dowel chest press and flexion x 8 with guarding and encouragement for relaxation needed Supine chest stretch alternating AROM  Manual Therapy P/ROM to bil shoulders in supine  into flexion, abd and D2 to pts available end motions. Multiple VC's during to relax due to muscle guarding.  STM to bil pectoralis insertions during P/ROM where pt palpably tight; also with coca butter to medial scapular borders where trigger points palpable Scap Mobs in S/L (done bil) into protraction and retraction with multiple VC's to relax  06/29/22: Manual Therapy P/ROM to bil shoulders in supine into flexion, abd and D2 to pts available end motions. Multiple VC's during to relax due to muscle guarding.  STM to bil pectoralis insertions during P/ROM where pt palpably tight;   06/25/22: Eval performed PROM into flexion and abd x 5 each with work on Special educational needs teacher on post op HEP to start with addition of forward wall flexion due to bil status and sig guarding   PATIENT EDUCATION:  Education details: POC, initial  HEP Person educated: Patient Education method: Programmer, multimedia, Facilities manager, Verbal cues, and Handouts Education comprehension: verbalized understanding  HOME EXERCISE PROGRAM: Post op Supine dowel flexion or clasped hands   ASSESSMENT:  CLINICAL IMPRESSION: Pt conts to be guarded with A/ROM of bil shoulders but this did seem some improved today. She also reports the overall chest tightness is some improved since starting physical therapy. Continued with manual therapy working to decrease end bil shoulder P/ROM tightness and improve muscle flexibility in same.   OBJECTIVE IMPAIRMENTS: decreased activity tolerance, decreased mobility, decreased ROM, decreased strength, and increased edema.   ACTIVITY LIMITATIONS: carrying, lifting, sleeping, and reach over head  PARTICIPATION LIMITATIONS: cleaning, community activity, and occupation  PERSONAL FACTORS: 1 comorbidity: SLNB on Rt  are also affecting patient's functional outcome.   REHAB POTENTIAL: Excellent  CLINICAL DECISION MAKING: Stable/uncomplicated  EVALUATION COMPLEXITY: Low  GOALS: Goals reviewed with patient? Yes  SHORT TERM GOALS=LTGs: Target date: 07/23/22  Pt will improve bil shoulder AROM to WNL to allow patient to return to all work and daily activities Baseline: Goal status: INITIAL  2.  Pt will be able to ride the bus to work carrying her things without limitations Baseline:  Goal status: INITIAL  3.  Pt will report feeling ready to go on her mission trip in regards to her shoulder and back  Baseline:  Goal status: INITIAL  PLAN:  PT FREQUENCY: 2x/week  PT DURATION: 4 weeks  PLANNED INTERVENTIONS: Therapeutic exercises, Therapeutic activity, Neuromuscular re-education, Patient/Family education, Self Care, Joint mobilization, Manual therapy, and Re-evaluation  PLAN FOR NEXT SESSION: bil shoulder AAROM/PROM, STM Rt pectoralis Lt?,  include some MLD for Rt region as needed. - eval back as referral comes  back.   Hermenia Bers, PTA 07/09/2022, 9:06 AM

## 2022-07-13 ENCOUNTER — Ambulatory Visit: Payer: 59

## 2022-07-13 DIAGNOSIS — M25611 Stiffness of right shoulder, not elsewhere classified: Secondary | ICD-10-CM

## 2022-07-13 DIAGNOSIS — Z853 Personal history of malignant neoplasm of breast: Secondary | ICD-10-CM

## 2022-07-13 DIAGNOSIS — Z9189 Other specified personal risk factors, not elsewhere classified: Secondary | ICD-10-CM

## 2022-07-13 NOTE — Therapy (Signed)
OUTPATIENT PHYSICAL THERAPY  UPPER EXTREMITY ONCOLOGY TREATMENT  Patient Name: Jaime Murillo MRN: [id number] DOB:[birth year], 62 y.o., female Today's Date: 07/13/2022  END OF SESSION:  PT End of Session - 07/13/22 0916     Visit Number 5    Number of Visits 9    Date for PT Re-Evaluation 07/23/22    Authorization Type No Auth needed    PT Start Time 0912    PT Stop Time 1000    PT Time Calculation (min) 48 min    Activity Tolerance Patient tolerated treatment well    Behavior During Therapy [facility] for tasks assessed/performed              Past Medical History:  Diagnosis Date   Anemia    Anxiety    Depression    History of cancer chemotherapy 07-17-2012  to  12-04-2012   right breast cancer   HSV infection    Hypertension    Insomnia    Malignant neoplasm of lower-inner quadrant of right breast of female, estrogen receptor negative [facility]) oncologist-  dr Pamelia Hoit--  per lov note in epic ,  no recurrence   dx 05/ 2014---  right breast IDC & DCIS, Grade 3, triple negative----  completed neo-adjuvant chemo 12-04-2012,  03-20-2013  right total mastectomy with sln bx (07-24-2013  left simple mastectomy w/ sln bx)   Neuropathy associated with cancer (HCC)    related to chemotherapy   Neuropathy of both feet    DUE TO CHEMO PER PATIENT   Nocturia    PONV (postoperative nausea and vomiting)    shaking after anesthesia; had n/v after mastectomy   Urinary frequency    Wears glasses    Past Surgical History:  Procedure Laterality Date   AIKEN OSTEOTOMY Right 01/27/2018   Procedure: DOUBLE   Lesly Dukes;  Surgeon: Park Liter, DPM;  Location: [facility];  Service: Podiatry;  Laterality: Right;  POPLITEAL/SAPH BLOCK   BREAST IMPLANT REMOVAL Bilateral 05/14/2022   Procedure: REMOVAL BILATERAL BREAST IMPLANTS;  Surgeon: Glenna Fellows, MD;  Location: [facility];  Service: Plastics;  Laterality: Bilateral;   BREAST LUMPECTOMY Right  01/12/2013   Procedure: RIGHT SENTINEL LYMPH  NODE BIOPSY;  Surgeon: Almond Lint, MD;  Location: [facility];  Service: General;  Laterality: Right;  RIGHT SENTINEL LYMPH NODE BIOPSY   BREAST RECONSTRUCTION WITH PLACEMENT OF TISSUE EXPANDER AND FLEX HD (ACELLULAR HYDRATED DERMIS) Right 03/20/2013   Procedure: IMMEDIATE RIGHT BREAST RECONSTRUCTION WITH PLACEMENT OF TISSUE EXPANDER AND FLEX HD (ACELLULAR HYDRATED DERMIS);  Surgeon: Glenna Fellows, MD;  Location: [facility] OR;  Service: Plastics;  Laterality: Right;   BREAST RECONSTRUCTION WITH PLACEMENT OF TISSUE EXPANDER AND FLEX HD (ACELLULAR HYDRATED DERMIS) Left 07/24/2013   Procedure: BREAST RECONSTRUCTION WITH PLACEMENT OF TISSUE EXPANDER AND FLEX HD (ACELLULAR HYDRATED DERMIS) TO THE LEFT BREAST;  Surgeon: Glenna Fellows, MD;  Location: [facility];  Service: Plastics;  Laterality: Left;   BREAST RECONSTRUCTION WITH PLACEMENT OF TISSUE EXPANDER AND FLEX HD (ACELLULAR HYDRATED DERMIS) Bilateral 01/07/2014   Procedure: BREAST RECONSTRUCTION WITH REMOVAL  OF TISSUE EXPANDER AND PLACEMENT  OF SILICONE IMPLANTS TO BILATERAL BREAST;  Surgeon: Glenna Fellows, MD;  Location: [facility] OR;  Service: Plastics;  Laterality: Bilateral;   CAPSULECTOMY Right 05/14/2022   Procedure: RIGHT CAPSULECTOMY AND REMOVAL IMPLANT MATERIAL RIGHT CHEST;  Surgeon: Glenna Fellows, MD;  Location: [facility];  Service: Plastics;  Laterality: Right;   COLONOSCOPY  LAPAROSCOPIC TUBAL LIGATION Bilateral 08-10-2001    dr Gaynell Face  @[facility]    with cauterization   PLACEMENT OF BREAST IMPLANTS Bilateral 05/14/2022   Procedure: PLACEMENT OF BILATERAL BREAST IMPLANTS;  Surgeon: Glenna Fellows, MD;  Location: [facility];  Service: Plastics;  Laterality: Bilateral;   PORT-A-CATH REMOVAL Left 01/12/2013   Procedure: REMOVAL PORT-A-CATH;  Surgeon: Almond Lint, MD;  Location: [facility];  Service: General;  Laterality:  Left;   PORTACATH PLACEMENT N/A 07/06/2012   Procedure: INSERTION PORT-A-CATH;  Surgeon: Almond Lint, MD;  Location: [facility];  Service: General;  Laterality: N/A;   SIMPLE MASTECTOMY WITH AXILLARY SENTINEL NODE BIOPSY Left 07/24/2013   Procedure: SIMPLE MASTECTOMY;  Surgeon: Almond Lint, MD;  Location: [facility];  Service: General;  Laterality: Left;   TOTAL MASTECTOMY Right 03/20/2013   Procedure: RIGHT SKIN SPARING TOTAL MASTECTOMY;  Surgeon: Almond Lint, MD;  Location: [facility];  Service: General;  Laterality: Right;   TRANSTHORACIC ECHOCARDIOGRAM  07/03/2012   ef 55-60%/  mild MR and TR/  mild dilated IVC   Patient Active Problem List   Diagnosis Date Noted   Women's annual routine gynecological examination 04/07/2020   History of breast cancer 04/07/2020   Screening examination for STD (sexually transmitted disease) 04/07/2020   Acquired absence of breast and nipple 01/07/2014   Personal history of breast cancer 07/11/2013   Fibroid uterus 07/04/2013   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   High blood pressure    Contact lens/glasses fitting    Malignant neoplasm of lower-inner quadrant of right breast of female, estrogen receptor negative (HCC) 06/23/2012   HSV (herpes simplex virus) anogenital infection 06/06/2012   Breast mass, right 06/06/2012   HTN (hypertension) 05/05/2011    PCP: Loura Back, NP  REFERRING PROVIDER: Dr. Leta Baptist  REFERRING DIAG: Post Mastectomy   THERAPY DIAG:  Personal history of breast cancer  Stiffness of right shoulder, not elsewhere classified  At risk for lymphedema  ONSET DATE:   Rationale for Evaluation and Treatment: Rehabilitation  SUBJECTIVE:                                                                                                                                                                                           SUBJECTIVE STATEMENT:  I spoke with Dr. Maude Leriche nurse and they saw  the picture you took and said it was okay and we would just keep the regular appt on 07/28/22. My back is really hurting me today. I got my hair done yesterday and had to sit for 3 hours so I think I just got really stiff from that.  I almost didn't come today bc it's so bad.   PERTINENT HISTORY: Triple negative Rt breast cancer treated in 2014 with chemo-xrt and then Rt mastectomy with implant reconstruction and 3 neg nodes removed.  Then Lt risk reducing mastectomy with implant.  Palpated nodes in axilla 02/01/22 which showed possible Rt implant rupture. Removal and switch of implants 05/14/22.  Other hx: In February; 03/01/22. Discectomy and laminectomy in lumbar spine.   - no restrictions for back - [facility]: Dr. Val Eagle  PAIN:  Are you having pain? Yes NPRS scale: 8-9/10 Pain location: back Pain orientation: bil low back down to buttocks and HS PAIN TYPE: stiff, constant  Pain description: tight, sore, pulling across my back Aggravating factors:  with new HEP Relieving factors: rest, hot shower  PRECAUTIONS: Rt lymphedema risk  WEIGHT BEARING RESTRICTIONS: No  FALLS:  Has patient fallen in last 6 months? 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   OBSERVATIONS / OTHER ASSESSMENTS: Incisions well healed   POSTURE: WNL  UPPER EXTREMITY AROM/PROM:  A/PROM RIGHT   eval  07/07/22  Shoulder extension 50 50  Shoulder flexion 85 pn 110   Shoulder abduction 70 105  Shoulder  internal rotation    Shoulder external rotation      (Blank rows = not tested)  A/PROM LEFT   eval 07/07/22  Shoulder extension 35 42  Shoulder flexion 80 105  Shoulder abduction 80 90  Shoulder internal rotation    Shoulder external rotation      (Blank rows = not tested)  UPPER EXTREMITY STRENGTH: Not tested due to limited ROM  QUICK DASH SURVEY: 68%   TODAY'S TREATMENT:                                                                                                                                          DATE:  07/14/22: Therapeutic Exs Pulleys into flexion and abduction x each with cueing - pt with slow, stiff motions due to increased LBP today Supine over half foam roll for following: Horz abd x 10, bil UE scaption into a "V" 2 x 5 with short rest between sets and then "snow angel" for bil abd x4 then stopped due to increased Lt axillary pain Manual Therapy P/ROM to bil shoulders in supine into flexion, abd and D2 to pts available end motions. Multiple VC's during to relax  due to muscle guarding.  STM to Lt> Rt pectoralis insertions during P/ROM where pt palpably tight  07/09/22: Therapeutic Exercises Pulleys into flexion and abduction x each with cueing - pt reports still feeling pull into elbow but less guarding Roll yellow ball up wall into flexion x 10 and then bil abd x 5 each with VC's for correct technique Supine over half foam roll for following: Horz abd x7, stopped due to reporting increased low back discomfort and then "snow angel" for bil abd x5, still with Lt axillary pain but pt was able to complete sets and cuing for deep breaths throughout for muscle relaxation.  Manual Therapy P/ROM to bil shoulders in supine into flexion, abd and D2 to pts available end motions. Multiple VC's during to relax due to muscle guarding.  STM to bil pectoralis insertions during P/ROM where pt palpably tight; also with coca butter to medial scapular borders where trigger  points palpable Scap Mobs in S/L (done bil) into protraction and retraction with multiple VC's to relax   07/07/22: TE: Pulleys into flexion and abduction x each with cueing - pt reports elbow due to guarding Supine dowel chest press and flexion x 8 with guarding and encouragement for relaxation needed Supine chest stretch alternating AROM  Manual Therapy P/ROM to bil shoulders in supine into flexion, abd and D2 to pts available end motions. Multiple VC's during to relax due to muscle guarding.  STM to bil pectoralis insertions during P/ROM where pt palpably tight; also with coca butter to medial scapular borders where trigger points palpable Scap Mobs in S/L (done bil) into protraction and retraction with multiple VC's to relax  06/29/22: Manual Therapy P/ROM to bil shoulders in supine into flexion, abd and D2 to pts available end motions. Multiple VC's during to relax due to muscle guarding.  STM to bil pectoralis insertions during P/ROM where pt palpably tight;   06/25/22: Eval performed PROM into flexion and abd x 5 each with work on Special educational needs teacher on post op HEP to start with addition of forward wall flexion due to bil status and sig guarding   PATIENT EDUCATION:  Education details: POC, initial HEP Person educated: Patient Education method: Programmer, multimedia, Facilities manager, Verbal cues, and Handouts Education comprehension: verbalized understanding  HOME EXERCISE PROGRAM: Post op Supine dowel flexion or clasped hands   ASSESSMENT:  CLINICAL IMPRESSION: Pt was feeling tighter and more stiff today in general from increased LBP. She reports this is due to getting her hair done and having to sit for 3 hours yesterday. Pt was supine on heat during manual therapy with extra padding over heat (so very light). Her P/ROM of bil shoulders was much improved towards end of session today and she was able to relax better for stretches as well allowing for increased motion. She is still  very tight and limited with A/ROM, but more so today seemingly from increased LBP. Pt was going to call her doctor again about LBP referral. This may be here next week and PT can add to POC as was planned.   OBJECTIVE IMPAIRMENTS: decreased activity tolerance, decreased mobility, decreased ROM, decreased strength, and increased edema.   ACTIVITY LIMITATIONS: carrying, lifting, sleeping, and reach over head  PARTICIPATION LIMITATIONS: cleaning, community activity, and occupation  PERSONAL FACTORS: 1 comorbidity: SLNB on Rt  are also affecting patient's functional outcome.   REHAB POTENTIAL: Excellent  CLINICAL DECISION MAKING: Stable/uncomplicated  EVALUATION COMPLEXITY: Low  GOALS: Goals reviewed with patient? Yes  SHORT TERM  GOALS=LTGs: Target date: 07/23/22  Pt will improve bil shoulder AROM to WNL to allow patient to return to all work and daily activities Baseline: Goal status: INITIAL  2.  Pt will be able to ride the bus to work carrying her things without limitations Baseline:  Goal status: INITIAL  3.  Pt will report feeling ready to go on her mission trip in regards to her shoulder and back  Baseline:  Goal status: INITIAL  PLAN:  PT FREQUENCY: 2x/week  PT DURATION: 4 weeks  PLANNED INTERVENTIONS: Therapeutic exercises, Therapeutic activity, Neuromuscular re-education, Patient/Family education, Self Care, Joint mobilization, Manual therapy, and Re-evaluation  PLAN FOR NEXT SESSION: bil shoulder AAROM/PROM, STM Rt pectoralis Lt?,  include some MLD for Rt region as needed. - eval back as referral comes back.   Hermenia Bers, PTA 07/13/2022, 12:49 PM

## 2022-07-19 ENCOUNTER — Other Ambulatory Visit: Payer: Self-pay

## 2022-07-19 ENCOUNTER — Telehealth: Payer: Self-pay

## 2022-07-19 ENCOUNTER — Inpatient Hospital Stay: Payer: 59

## 2022-07-19 ENCOUNTER — Encounter: Payer: Self-pay | Admitting: Adult Health

## 2022-07-19 ENCOUNTER — Inpatient Hospital Stay: Payer: 59 | Attending: Adult Health | Admitting: Adult Health

## 2022-07-19 VITALS — BP 123/86 | HR 75 | Temp 97.9°F | Resp 18 | Ht 65.0 in | Wt 121.9 lb

## 2022-07-19 DIAGNOSIS — C50311 Malignant neoplasm of lower-inner quadrant of right female breast: Secondary | ICD-10-CM

## 2022-07-19 DIAGNOSIS — I10 Essential (primary) hypertension: Secondary | ICD-10-CM | POA: Insufficient documentation

## 2022-07-19 DIAGNOSIS — Z800 Family history of malignant neoplasm of digestive organs: Secondary | ICD-10-CM | POA: Insufficient documentation

## 2022-07-19 DIAGNOSIS — E559 Vitamin D deficiency, unspecified: Secondary | ICD-10-CM

## 2022-07-19 DIAGNOSIS — R590 Localized enlarged lymph nodes: Secondary | ICD-10-CM | POA: Diagnosis not present

## 2022-07-19 DIAGNOSIS — Z5982 Transportation insecurity: Secondary | ICD-10-CM | POA: Insufficient documentation

## 2022-07-19 DIAGNOSIS — Z8249 Family history of ischemic heart disease and other diseases of the circulatory system: Secondary | ICD-10-CM | POA: Insufficient documentation

## 2022-07-19 DIAGNOSIS — Z809 Family history of malignant neoplasm, unspecified: Secondary | ICD-10-CM | POA: Insufficient documentation

## 2022-07-19 DIAGNOSIS — Z8042 Family history of malignant neoplasm of prostate: Secondary | ICD-10-CM | POA: Insufficient documentation

## 2022-07-19 DIAGNOSIS — Z79899 Other long term (current) drug therapy: Secondary | ICD-10-CM | POA: Insufficient documentation

## 2022-07-19 DIAGNOSIS — T451X5A Adverse effect of antineoplastic and immunosuppressive drugs, initial encounter: Secondary | ICD-10-CM

## 2022-07-19 DIAGNOSIS — G620 Drug-induced polyneuropathy: Secondary | ICD-10-CM

## 2022-07-19 DIAGNOSIS — Z8041 Family history of malignant neoplasm of ovary: Secondary | ICD-10-CM | POA: Diagnosis not present

## 2022-07-19 DIAGNOSIS — Z171 Estrogen receptor negative status [ER-]: Secondary | ICD-10-CM | POA: Diagnosis not present

## 2022-07-19 DIAGNOSIS — Z8639 Personal history of other endocrine, nutritional and metabolic disease: Secondary | ICD-10-CM | POA: Insufficient documentation

## 2022-07-19 LAB — VITAMIN B12: Vitamin B-12: 349 pg/mL (ref 180–914)

## 2022-07-19 LAB — VITAMIN D 25 HYDROXY (VIT D DEFICIENCY, FRACTURES): Vit D, 25-Hydroxy: 64.79 ng/mL (ref 30–100)

## 2022-07-19 NOTE — Assessment & Plan Note (Signed)
Her chemotherapy-induced peripheral neuropathy is present but stable.  She will continue on gabapentin and Cymbalta for this.  She is tolerating these well.  She does have occasional flareups and for this she has intermittent FMLA from her work.

## 2022-07-19 NOTE — Progress Notes (Signed)
[facility] Cancer Follow up:    Jaime Back, NP [address] [place] [address]   DIAGNOSIS:  Cancer Staging  Malignant neoplasm of lower-inner quadrant of right breast of female, estrogen receptor negative (HCC) Staging form: Breast, AJCC 7th Edition - Clinical: Stage IA (T1c, N0, cM0) - Unsigned Specimen type: Core Needle Biopsy Histopathologic type: 9931 Laterality: Right Staging comments: Staged at breast conference 6.4.14  - Pathologic stage from 03/20/2013: yT0 - Unsigned Staged by: Pathologist Specimen type: Core Needle Biopsy Histopathologic type: 9931 Stage prefix: Post-therapy Laterality: Right   SUMMARY OF ONCOLOGIC HISTORY: Oncology History  Malignant neoplasm of lower-inner quadrant of right breast of female, estrogen receptor negative (HCC)  06/20/2012 Initial Biopsy   Right breast needle core biopsy (3 o'clock): Grade 3, IDC & DCIS.  ER- (0%), PR- (0%), HER2- (ratio 1.26). Ki67 95%.    06/20/2012 Breast US   Right breast with slightly irregular oval hypoechoic mass measuring 7 x 6 x 17 mm at 3 o'clock postion. No mammographic evidence of breast malignancy bilaterally. Palpable area of concern at 12 o'clock, but no suspicious findings at this location on [place].    06/23/2012 Initial Diagnosis   Cancer of lower-inner quadrant of RIGHT female breast   06/26/2012 Breast MRI   Right breast w/clumped NME at 2 o'clock position measuring 0.8 x 1.6 x 2.2 cm located anterior/superior to known cancer. Also 6 mm ill-defined ovoid mass posterior lateral to bx-proven ca, lying superficial pect. muscle. Likely multifocal disease.    07/03/2012 Echocardiogram   Pre-treatment EF: 55-60%   07/03/2012 Imaging   CT c/a/p: No evidence of metastatic disease.    07/05/2012 Procedure   Genetic counseling/testing: OncoGeneDX with 3 VUS identified. 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  Also heteroxygous for 2 likely benign variants on BRCA1 and NBN.   Remainder of genes in GeneDX panel negative.    07/12/2012 Procedure   Right breast needle core biopsy: Benign breast tissue with focal pseudoangiomatous stromal hyperplasic (PASH). No evidence of malignancy.    07/17/2012 - 08/28/2012 Neo-Adjuvant Chemotherapy   Adriamycin & Cytoxan x 4 cycles completed.    09/11/2012 - 10/23/2012 Neo-Adjuvant Chemotherapy   Taxol/Carboplatin weekly x 7 cycles (stopped due to peripheral neuropathy; changed to Gemcitabine/Carbo for additional 3 cycles).   11/06/2012 - 12/04/2012 Neo-Adjuvant Chemotherapy   Gemcitabine/Carboplatin x 3 cycles completed.    11/24/2012 Breast MRI   Previous right breast multifocal malignancy with complete imaging resolution.  Stable 6 mm hemangioma or cyst in liver.    01/12/2013 Surgery   Right axillary SLNB ([place]):  3 SLNs negative.  Port also removed on this date.    03/20/2013 Definitive Surgery   Right skin-sparing total mastectomy Donell Beers) with immediate reconstruction: No atypia or malignancy identified.  Benign skin & nipple. s/p neoadjuvant therapy.  Represents a complete response. Placement of tissue expanders.    03/20/2013 Pathologic Stage   ypT0; No evidence of cancer.    07/24/2013 Surgery   LEFT simple mastectomy with immediate reconstruction; showed ALH and fibroadenoma. Placement of tissue expanders.    01/07/2014 Surgery   Bilateral breast reconstruction ([place]): Removal of bilateral tissue expanders and placement of bilateral silicone breast implants.    06/03/2014 Survivorship   Survivorship Care Plan given to patient and reveiwed with her during in-person visit.      CURRENT THERAPY: observation  INTERVAL HISTORY: Jaime Murillo 62 y.o. female returns for follow-up of her history of breast cancer.  At her visit with me in January and noted what felt like right axillary adenopathy.  An ultrasound demonstrated extracapsular silicone likely within a lower axillary lymph node.  Subsequently  underwent MRI of the breast without contrast on February 19, 2022.  This demonstrated probable benign right axillary and internal mammary silicone lymphadenopathy but further evaluation was recommended with MRI with and without contrast.  She subsequently underwent MRI of the bilateral breast with and without contrast that demonstrated 2 enlarged right axillary lymph nodes with mild enhancement and 3 enlarged right sided internal mammary lymph nodes with minimal enhancement.  These lymph nodes were bright on silicone only sequence on the recent prior MRI consistent with silicone lymphadenopathy.  There was no evidence of malignancy.  This imaging confirmed right silicone implant rupture.  She was referred to Dr. Leta Baptist underwent bilateral breast implant removal, right capsulectomy and implant replacement on May 14, 2022.  Since surgery she has noted increased pain over her right replacement site and was unsure about returning to work in July when she saw Dr. Leta Baptist in May 2024.  She was referred to physical therapy and Dr. Leta Baptist plan to extend her short-term disability.  Jaime Murillo was also prescribed a muscle relaxant.  Jaime Murillo is here for f/u.  She is planning to go on a missions trip to [place] with church.  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   She has continued peripheral neuropathy from chemotherapy and is taking Gabapentin and Cymbalta to manage this.  I tested her vitamin d level several months ago and it was low.  She is currently taking weekly vitamin d supplementation.  She is due for retest in the next few weeks.     Patient Active Problem List   Diagnosis Date Noted   Women's annual routine gynecological examination 04/07/2020   History of breast cancer 04/07/2020   Screening examination for STD (sexually transmitted disease) 04/07/2020   Acquired absence of breast  and nipple 01/07/2014   Personal history of breast cancer 07/11/2013   Fibroid uterus 07/04/2013   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   High blood pressure    Contact lens/glasses fitting    Malignant neoplasm of lower-inner quadrant of right breast of female, estrogen receptor negative (HCC) 06/23/2012   HSV (herpes simplex virus) anogenital infection 06/06/2012   Breast mass, right 06/06/2012   HTN (hypertension) 05/05/2011    is allergic to lyrica [pregabalin].  MEDICAL HISTORY: Past Medical History:  Diagnosis Date   Anemia    Anxiety    Depression    History of cancer chemotherapy 07-17-2012  to  12-04-2012   right breast cancer   HSV infection    Hypertension    Insomnia    Malignant neoplasm of lower-inner quadrant of right breast of female, estrogen receptor negative [facility]) oncologist-  dr Pamelia Hoit--  per lov note in epic ,  no recurrence   dx 05/ 2014---  right breast IDC & DCIS, Grade 3, triple negative----  completed neo-adjuvant chemo 12-04-2012,  03-20-2013  right total mastectomy with sln bx (07-24-2013  left simple mastectomy w/ sln bx)   Neuropathy associated with cancer (HCC)    related to chemotherapy   Neuropathy of both feet    DUE TO CHEMO PER PATIENT   Nocturia    PONV (postoperative nausea and vomiting)    shaking after anesthesia; had  n/v after mastectomy   Urinary frequency    Wears glasses     SURGICAL HISTORY: Past Surgical History:  Procedure Laterality Date   AIKEN OSTEOTOMY Right 01/27/2018   Procedure: DOUBLE   Lesly Dukes;  Surgeon: Park Liter, DPM;  Location: [facility];  Service: Podiatry;  Laterality: Right;  POPLITEAL/SAPH BLOCK   BREAST IMPLANT REMOVAL Bilateral 05/14/2022   Procedure: REMOVAL BILATERAL BREAST IMPLANTS;  Surgeon: Glenna Fellows, MD;  Location: [facility];  Service: Plastics;  Laterality: Bilateral;   BREAST LUMPECTOMY Right 01/12/2013   Procedure:  RIGHT SENTINEL LYMPH  NODE BIOPSY;  Surgeon: Almond Lint, MD;  Location: [facility];  Service: General;  Laterality: Right;  RIGHT SENTINEL LYMPH NODE BIOPSY   BREAST RECONSTRUCTION WITH PLACEMENT OF TISSUE EXPANDER AND FLEX HD (ACELLULAR HYDRATED DERMIS) Right 03/20/2013   Procedure: IMMEDIATE RIGHT BREAST RECONSTRUCTION WITH PLACEMENT OF TISSUE EXPANDER AND FLEX HD (ACELLULAR HYDRATED DERMIS);  Surgeon: Glenna Fellows, MD;  [facility];  Service: Plastics;  Laterality: Right;   BREAST RECONSTRUCTION WITH PLACEMENT OF TISSUE EXPANDER AND FLEX HD (ACELLULAR HYDRATED DERMIS) Left 07/24/2013   Procedure: BREAST RECONSTRUCTION WITH PLACEMENT OF TISSUE EXPANDER AND FLEX HD (ACELLULAR HYDRATED DERMIS) TO THE LEFT BREAST;  Surgeon: Glenna Fellows, MD;  Location: [facility];  Service: Plastics;  Laterality: Left;   BREAST RECONSTRUCTION WITH PLACEMENT OF TISSUE EXPANDER AND FLEX HD (ACELLULAR HYDRATED DERMIS) Bilateral 01/07/2014   Procedure: BREAST RECONSTRUCTION WITH REMOVAL  OF TISSUE EXPANDER AND PLACEMENT  OF SILICONE IMPLANTS TO BILATERAL BREAST;  Surgeon: Glenna Fellows, MD;  Location: [facility] OR;  Service: Plastics;  Laterality: Bilateral;   CAPSULECTOMY Right 05/14/2022   Procedure: RIGHT CAPSULECTOMY AND REMOVAL IMPLANT MATERIAL RIGHT CHEST;  Surgeon: Glenna Fellows, MD;  Location: [facility];  Service: Plastics;  Laterality: Right;   COLONOSCOPY     LAPAROSCOPIC TUBAL LIGATION Bilateral 08-10-2001    dr Gaynell Face  @[facility]    with cauterization   PLACEMENT OF BREAST IMPLANTS Bilateral 05/14/2022   Procedure: PLACEMENT OF BILATERAL BREAST IMPLANTS;  Surgeon: Glenna Fellows, MD;  Location: [facility];  Service: Plastics;  Laterality: Bilateral;   PORT-A-CATH REMOVAL Left 01/12/2013   Procedure: REMOVAL PORT-A-CATH;  Surgeon: Almond Lint, MD;  Location: [facility];  Service: General;  Laterality: Left;   PORTACATH PLACEMENT  N/A 07/06/2012   Procedure: INSERTION PORT-A-CATH;  Surgeon: Almond Lint, MD;  Location: [facility];  Service: General;  Laterality: N/A;   SIMPLE MASTECTOMY WITH AXILLARY SENTINEL NODE BIOPSY Left 07/24/2013   Procedure: SIMPLE MASTECTOMY;  Surgeon: Almond Lint, MD;  Location: [facility];  Service: General;  Laterality: Left;   TOTAL MASTECTOMY Right 03/20/2013   Procedure: RIGHT SKIN SPARING TOTAL MASTECTOMY;  Surgeon: Almond Lint, MD;  Location: [facility];  Service: General;  Laterality: Right;   TRANSTHORACIC ECHOCARDIOGRAM  07/03/2012   ef 55-60%/  mild MR and TR/  mild dilated IVC    SOCIAL HISTORY: Social History   Socioeconomic History   Marital status: Single    Spouse name: Not on file   Number of children: 2   Years of education: 13   Highest education level: Not on file  Occupational History    Comment: Disabled  Tobacco Use   Smoking status: Never   Smokeless tobacco: Never  Vaping Use   Vaping Use: Never used  Substance and Sexual Activity   Alcohol use: No    Alcohol/week: 0.0  standard drinks of alcohol   Drug use: No   Sexual activity: Yes    Birth control/protection: Surgical    Comment: BTL  Other Topics Concern   Not on file  Social History Narrative   Patient lives at home with her son she is single.   Disabled.   Education- One year of college.   Right handed.   Caffeine- One cup daily.   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 To graduate May 2020   Social Determinants of Health   Financial Resource Strain: Not on file  Food Insecurity: Food Insecurity Present (04/07/2020)   Hunger Vital Sign    Worried About Running Out of Food in the Last Year: Often true    Ran Out of Food in the Last Year: Often true  Transportation Needs: Unmet Transportation Needs (04/07/2020)   PRAPARE - Administrator, Civil Service (Medical): Yes    Lack of Transportation (Non-Medical): Yes  Physical Activity: Not on file   Stress: Not on file  Social Connections: Not on file  Intimate Partner Violence: Not on file    FAMILY HISTORY: Family History  Problem Relation Age of Onset   Heart disease Mother    Aneurysm Mother    Hypertension Father    Prostate cancer Father 27   Hypertension Sister    Ovarian cancer Paternal Grandmother        died in her 33s   Stomach cancer Maternal Grandmother 9   Cancer Cousin        paternal cousin with an unknown form of cancer    Review of Systems  Constitutional:  Negative for appetite change, chills, fatigue, fever and unexpected weight change.  HENT:   Negative for hearing loss, lump/mass and trouble swallowing.   Eyes:  Negative for eye problems and icterus.  Respiratory:  Negative for chest tightness, cough and shortness of breath.   Cardiovascular:  Negative for chest pain, leg swelling and palpitations.  Gastrointestinal:  Negative for abdominal distention, abdominal pain, constipation, diarrhea, nausea and vomiting.  Endocrine: Negative for hot flashes.  Genitourinary:  Negative for difficulty urinating.   Musculoskeletal:  Negative for arthralgias.  Skin:  Negative for itching and rash.  Neurological:  Negative for dizziness, extremity weakness, headaches and numbness.  Hematological:  Negative for adenopathy. Does not bruise/bleed easily.  Psychiatric/Behavioral:  Negative for depression. The patient is not nervous/anxious.       PHYSICAL EXAMINATION    Vitals:   07/19/22 0902  BP: 123/86  Pulse: 75  Resp: 18  Temp: 97.9 F (36.6 C)  SpO2: 100%    Physical Exam Constitutional:      General: She is not in acute distress.    Appearance: Normal appearance. She is not toxic-appearing.  HENT:     Head: Normocephalic and atraumatic.     Mouth/Throat:     Mouth: Mucous membranes are moist.     Pharynx: Oropharynx is clear. No oropharyngeal exudate or posterior oropharyngeal erythema.  Eyes:     General: No scleral  icterus. Cardiovascular:     Rate and Rhythm: Normal rate and regular rhythm.     Pulses: Normal pulses.     Heart sounds: Normal heart sounds.  Pulmonary:     Effort: Pulmonary effort is normal.     Breath sounds: Normal breath sounds.  Chest:     Comments: S/p bilateral mastectomies and reconstruction, no sign of local recurrence Abdominal:     General:  Abdomen is flat. Bowel sounds are normal. There is no distension.     Palpations: Abdomen is soft.     Tenderness: There is no abdominal tenderness.  Musculoskeletal:        General: No swelling.     Cervical Murillo: Neck supple.  Lymphadenopathy:     Cervical: No cervical adenopathy.  Skin:    General: Skin is warm and dry.     Findings: No rash.  Neurological:     General: No focal deficit present.     Mental Status: She is alert.  Psychiatric:        Mood and Affect: Mood normal.        Behavior: Behavior normal.     LABORATORY DATA: None today   ASSESSMENT and THERAPY PLAN:   Chemotherapy-induced peripheral neuropathy (HCC) Her chemotherapy-induced peripheral neuropathy is present but stable.  She will continue on gabapentin and Cymbalta for this.  She is tolerating these well.  She does have occasional flareups and for this she has intermittent FMLA from her work.  Malignant neoplasm of lower-inner quadrant of right breast of female, estrogen receptor negative (HCC) Jaime Murillo is a 62 year old woman with history of triple negative breast cancer that was diagnosed in 2015 status post bilateral mastectomies and adjuvant chemotherapy.    Jaime Murillo has no clinical or radiographic signs of breast cancer recurrence.  She underwent implant exchange without any significant issues.  She continues to follow-up with Dr. Leta Baptist and physical therapy concerning her right chest wall discomfort.  We are going to recheck a vitamin D and B12 level on Jaime Murillo today as she has required supplementation in the past.  Jaime Murillo will return in 1  year for continued long-term follow-up.  Overall she is in good spirits today and is improving from the distress she was experiencing that was job-related Murillo in January.     All questions were answered. The patient knows to call the clinic with any problems, questions or concerns. We can certainly see the patient much sooner if necessary.  Total encounter time:30 minutes*in face-to-face visit time, chart review, lab review, care coordination, order entry, and documentation of the encounter time.    Lillard Anes, NP 07/19/22 9:34 AM Medical Oncology and Hematology [facility] [address] [place], [place] [address] Tel. [phone]    Fax. [phone]  *Total Encounter Time as defined by the Centers for Medicare and Medicaid Services includes, in addition to the face-to-face time of a patient visit (documented in the note above) non-face-to-face time: obtaining and reviewing outside history, ordering and reviewing medications, tests or procedures, care coordination (communications with other health care professionals or caregivers) and documentation in the medical record.

## 2022-07-19 NOTE — Assessment & Plan Note (Signed)
Jaime Murillo is a 62 year old woman with history of triple negative breast cancer that was diagnosed in 2015 status post bilateral mastectomies and adjuvant chemotherapy.    Jaime Murillo has no clinical or radiographic signs of breast cancer recurrence.  She underwent implant exchange without any significant issues.  She continues to follow-up with Dr. Leta Baptist and physical therapy concerning her right chest wall discomfort.  We are going to recheck a vitamin D and B12 level on Jaime Murillo today as she has required supplementation in the past.  Jaime Murillo will return in 1 year for continued long-term follow-up.  Overall she is in good spirits today and is improving from the distress she was experiencing that was job-related back in January.

## 2022-07-19 NOTE — Telephone Encounter (Signed)
Pt made aware. She verbalized agreement and understanding.

## 2022-07-19 NOTE — Telephone Encounter (Signed)
-----   Message from Loa Socks, NP sent at 07/19/2022  3:39 PM EDT ----- Please let Kasey know I recommend Vitamin D3, 1000 international units  daily, over the counter and Vitamin b12 sublingual daily, please d/c the vitamin d prescription off of her med list   ----- Message ----- From: Leory Plowman, Lab In [place] Sent: 07/19/2022  11:36 AM EDT To: Loa Socks, NP

## 2022-07-19 NOTE — Telephone Encounter (Signed)
Called [facility] health to request pt's cologuard results. Our fax number was given.

## 2022-07-19 NOTE — Addendum Note (Signed)
Addended by: De Blanch on: 07/19/2022 05:28 PM   Modules accepted: Orders

## 2022-07-20 ENCOUNTER — Encounter: Payer: Self-pay | Admitting: Rehabilitation

## 2022-07-20 ENCOUNTER — Ambulatory Visit: Payer: 59 | Admitting: Rehabilitation

## 2022-07-20 DIAGNOSIS — Z853 Personal history of malignant neoplasm of breast: Secondary | ICD-10-CM

## 2022-07-20 DIAGNOSIS — M25611 Stiffness of right shoulder, not elsewhere classified: Secondary | ICD-10-CM

## 2022-07-20 DIAGNOSIS — Z9189 Other specified personal risk factors, not elsewhere classified: Secondary | ICD-10-CM

## 2022-07-20 NOTE — Therapy (Signed)
OUTPATIENT PHYSICAL THERAPY  UPPER EXTREMITY ONCOLOGY TREATMENT  Patient Name: Jaime Murillo MRN: [id number] DOB:01-18-1961, 62 y.o., female Today's Date: 07/20/2022  END OF SESSION:  PT End of Session - 07/20/22 0853     Visit Number 6    Number of Visits 9    Date for PT Re-Evaluation 07/23/22    PT Start Time 0900    PT Stop Time 0953    PT Time Calculation (min) 53 min    Activity Tolerance Patient tolerated treatment well    Behavior During Therapy [facility] for tasks assessed/performed              Past Medical History:  Diagnosis Date   Anemia    Anxiety    Depression    History of cancer chemotherapy 07-17-2012  to  12-04-2012   right breast cancer   HSV infection    Hypertension    Insomnia    Malignant neoplasm of lower-inner quadrant of right breast of female, estrogen receptor negative [facility]) oncologist-  dr Pamelia Hoit--  per lov note in epic ,  no recurrence   dx 05/ 2014---  right breast IDC & DCIS, Grade 3, triple negative----  completed neo-adjuvant chemo 12-04-2012,  03-20-2013  right total mastectomy with sln bx (07-24-2013  left simple mastectomy w/ sln bx)   Neuropathy associated with cancer (HCC)    related to chemotherapy   Neuropathy of both feet    DUE TO CHEMO PER PATIENT   Nocturia    PONV (postoperative nausea and vomiting)    shaking after anesthesia; had n/v after mastectomy   Urinary frequency    Wears glasses    Past Surgical History:  Procedure Laterality Date   AIKEN OSTEOTOMY Right 01/27/2018   Procedure: DOUBLE   Lesly Dukes;  Surgeon: Park Liter, DPM;  Location: [facility];  Service: Podiatry;  Laterality: Right;  POPLITEAL/SAPH BLOCK   BREAST IMPLANT REMOVAL Bilateral 05/14/2022   Procedure: REMOVAL BILATERAL BREAST IMPLANTS;  Surgeon: Glenna Fellows, MD;  Location: [facility];  Service: Plastics;  Laterality: Bilateral;   BREAST LUMPECTOMY Right 01/12/2013   Procedure: RIGHT SENTINEL LYMPH   NODE BIOPSY;  Surgeon: Almond Lint, MD;  Location: [facility];  Service: General;  Laterality: Right;  RIGHT SENTINEL LYMPH NODE BIOPSY   BREAST RECONSTRUCTION WITH PLACEMENT OF TISSUE EXPANDER AND FLEX HD (ACELLULAR HYDRATED DERMIS) Right 03/20/2013   Procedure: IMMEDIATE RIGHT BREAST RECONSTRUCTION WITH PLACEMENT OF TISSUE EXPANDER AND FLEX HD (ACELLULAR HYDRATED DERMIS);  Surgeon: Glenna Fellows, MD;  Location: [facility] OR;  Service: Plastics;  Laterality: Right;   BREAST RECONSTRUCTION WITH PLACEMENT OF TISSUE EXPANDER AND FLEX HD (ACELLULAR HYDRATED DERMIS) Left 07/24/2013   Procedure: BREAST RECONSTRUCTION WITH PLACEMENT OF TISSUE EXPANDER AND FLEX HD (ACELLULAR HYDRATED DERMIS) TO THE LEFT BREAST;  Surgeon: Glenna Fellows, MD;  Location: [facility];  Service: Plastics;  Laterality: Left;   BREAST RECONSTRUCTION WITH PLACEMENT OF TISSUE EXPANDER AND FLEX HD (ACELLULAR HYDRATED DERMIS) Bilateral 01/07/2014   Procedure: BREAST RECONSTRUCTION WITH REMOVAL  OF TISSUE EXPANDER AND PLACEMENT  OF SILICONE IMPLANTS TO BILATERAL BREAST;  Surgeon: Glenna Fellows, MD;  Location: [facility] OR;  Service: Plastics;  Laterality: Bilateral;   CAPSULECTOMY Right 05/14/2022   Procedure: RIGHT CAPSULECTOMY AND REMOVAL IMPLANT MATERIAL RIGHT CHEST;  Surgeon: Glenna Fellows, MD;  Location:  SURGERY CENTER;  Service: Plastics;  Laterality: Right;   COLONOSCOPY     LAPAROSCOPIC TUBAL LIGATION Bilateral 08-10-2001  dr Gaynell Face  @[facility]    with cauterization   PLACEMENT OF BREAST IMPLANTS Bilateral 05/14/2022   Procedure: PLACEMENT OF BILATERAL BREAST IMPLANTS;  Surgeon: Glenna Fellows, MD;  Location: [facility];  Service: Plastics;  Laterality: Bilateral;   PORT-A-CATH REMOVAL Left 01/12/2013   Procedure: REMOVAL PORT-A-CATH;  Surgeon: Almond Lint, MD;  Location: [facility];  Service: General;  Laterality: Left;   PORTACATH PLACEMENT N/A 07/06/2012    Procedure: INSERTION PORT-A-CATH;  Surgeon: Almond Lint, MD;  Location: [facility] OR;  Service: General;  Laterality: N/A;   SIMPLE MASTECTOMY WITH AXILLARY SENTINEL NODE BIOPSY Left 07/24/2013   Procedure: SIMPLE MASTECTOMY;  Surgeon: Almond Lint, MD;  Location: [facility];  Service: General;  Laterality: Left;   TOTAL MASTECTOMY Right 03/20/2013   Procedure: RIGHT SKIN SPARING TOTAL MASTECTOMY;  Surgeon: Almond Lint, MD;  Location: [facility] OR;  Service: General;  Laterality: Right;   TRANSTHORACIC ECHOCARDIOGRAM  07/03/2012   ef 55-60%/  mild MR and TR/  mild dilated IVC   Patient Active Problem List   Diagnosis Date Noted   Women's annual routine gynecological examination 04/07/2020   History of breast cancer 04/07/2020   Screening examination for STD (sexually transmitted disease) 04/07/2020   Acquired absence of breast and nipple 01/07/2014   Personal history of breast cancer 07/11/2013   Fibroid uterus 07/04/2013   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   High blood pressure    Contact lens/glasses fitting    Malignant neoplasm of lower-inner quadrant of right breast of female, estrogen receptor negative (HCC) 06/23/2012   HSV (herpes simplex virus) anogenital infection 06/06/2012   Breast mass, right 06/06/2012   HTN (hypertension) 05/05/2011    PCP: Loura Back, NP  REFERRING PROVIDER: Dr. Leta Baptist  REFERRING DIAG: Post Mastectomy   THERAPY DIAG:  Personal history of breast cancer  At risk for lymphedema  Stiffness of right shoulder, not elsewhere classified  ONSET DATE:   Rationale for Evaluation and Treatment: Rehabilitation  SUBJECTIVE:                                                                                                                                                                                           SUBJECTIVE STATEMENT:  I leave next Wed on my trip to [place]  PERTINENT HISTORY: Triple negative Rt breast cancer  treated in 2014 with chemo-xrt and then Rt mastectomy with implant reconstruction and 3 neg nodes removed.  Then Lt risk reducing mastectomy with implant.  Palpated nodes in axilla 02/01/22 which showed possible Rt implant rupture. Removal and switch of implants 05/14/22.  Other hx: In February; 03/01/22.  Discectomy and laminectomy in lumbar spine.   - no restrictions for back - [facility]: Dr. Val Eagle  PAIN:  Are you having pain? Yes NPRS scale: 5/10 Pain location: back Pain orientation: bil low back PAIN TYPE: stiff, constant  Pain description: tight, sore, pulling across my back Aggravating factors:  with new HEP Relieving factors: rest, hot shower  PRECAUTIONS: Rt lymphedema risk  WEIGHT BEARING RESTRICTIONS: No  FALLS:  Has patient fallen in last 6 months? 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   OBSERVATIONS / OTHER ASSESSMENTS: Incisions well healed   POSTURE: WNL  UPPER EXTREMITY AROM/PROM:  A/PROM RIGHT   eval  07/07/22 07/20/22  Shoulder extension 50 50   Shoulder flexion 85 pn 110  110  Shoulder abduction 70 105 110  Shoulder internal rotation     Shoulder external rotation       (Blank rows = not tested)  A/PROM LEFT   eval 07/07/22 07/20/22  Shoulder extension 35 42   Shoulder flexion 80 105 120  Shoulder abduction 80 90 135  Shoulder internal rotation     Shoulder external rotation       (Blank rows = not tested)  UPPER EXTREMITY STRENGTH: Not tested  due to limited ROM  QUICK DASH SURVEY: 68%   TODAY'S TREATMENT:                                                                                                                                          DATE:  07/20/22: Therapeutic Exercises Pulleys into flexion and abduction x 2:12min each with cueing Roll yellow ball up wall into flexion x 5 Education on good back/ general stretch for her trip per below with performance and cueing for each Supine: diaphragmatic breathing x 10, Horz abd x7, "snow angel" for bil abd x5, alternating flexion x 5, pro/ret x 5, extension presses,  all with TrA activation added for low back Manual Therapy P/ROM to bil shoulders in supine into flexion, abd and D2 to pts available end motions.   07/14/22: Therapeutic Exs Pulleys into flexion and abduction x each with cueing - pt with slow, stiff motions due to increased LBP today Supine over half foam roll for following: Horz abd x 10, bil UE scaption into a "V" 2 x 5 with short rest between sets and then "snow angel" for bil abd x4  then stopped due to increased Lt axillary pain Manual Therapy P/ROM to bil shoulders in supine into flexion, abd and D2 to pts available end motions. Multiple VC's during to relax due to muscle guarding.  STM to Lt> Rt pectoralis insertions during P/ROM where pt palpably tight  07/09/22: Therapeutic Exercises Pulleys into flexion and abduction x each with cueing - pt reports still feeling pull into elbow but less guarding Roll yellow ball up wall into flexion x 10 and then bil abd x 5 each with VC's for correct technique Supine over half foam roll for following: Horz abd x7, stopped due to reporting increased low back discomfort and then "snow angel" for bil abd x5, still with Lt axillary pain but pt was able to complete sets and cuing for deep breaths throughout for muscle relaxation.  Manual Therapy P/ROM to bil shoulders in supine into flexion, abd and D2 to pts available  end motions. Multiple VC's during to relax due to muscle guarding.  STM to bil pectoralis insertions during P/ROM where pt palpably tight; also with coca butter to medial scapular borders where trigger points palpable Scap Mobs in S/L (done bil) into protraction and retraction with multiple VC's to relax   07/07/22: TE: Pulleys into flexion and abduction x each with cueing - pt reports elbow due to guarding Supine dowel chest press and flexion x 8 with guarding and encouragement for relaxation needed Supine chest stretch alternating AROM  Manual Therapy P/ROM to bil shoulders in supine into flexion, abd and D2 to pts available end motions. Multiple VC's during to relax due to muscle guarding.  STM to bil pectoralis insertions during P/ROM where pt palpably tight; also with coca butter to medial scapular borders where trigger points palpable Scap Mobs in S/L (done bil) into protraction and retraction with multiple VC's to relax  06/29/22: Manual Therapy P/ROM to bil shoulders in supine into flexion, abd and D2 to pts available end motions. Multiple VC's during to relax due to muscle guarding.  STM to bil pectoralis insertions during P/ROM where pt palpably tight;   06/25/22: Eval performed PROM into flexion and abd x 5 each with work on Special educational needs teacher on post op HEP to start with addition of forward wall flexion due to bil status and sig guarding   PATIENT EDUCATION:  Education details: POC, initial HEP Person educated: Patient Education method: Programmer, multimedia, Facilities manager, Verbal cues, and Handouts Education comprehension: verbalized understanding  HOME EXERCISE PROGRAM: Post op Supine dowel flexion or clasped hands  Access Code: [email or web address] URL: [email or web address] Date: 07/20/2022 Prepared by: Gwenevere Abbot  Exercises - Standing Gastroc Stretch  - 1 x daily - 7 x weekly - 1 sets - 3 reps - 20-30 seconds hold - Supine Hamstring Stretch with Strap  - 1 x daily  - 7 x weekly - 1 sets - 3 reps - 20-30 seconds hold - Supine Hamstring Stretch  - 1 x daily - 7 x weekly - 1 sets - 3 reps - 20-30 seconds hold - Supine Lower Trunk Rotation  - 1 x daily - 7 x weekly - 1 sets - 3 reps - 20-30 seconds hold - Supine Single Knee to Chest Stretch  - 1 x daily - 7 x weekly - 1 sets - 3 reps - 20-30 seconds hold  ASSESSMENT:  CLINICAL IMPRESSION: Pt is doing much better than when this PT last saw her.  She has much improved PROM but is still very limited with her AROM.  Started some low back stretches today due to pt leaving for [place] next week. Pt reports that her back feels better upon leaving.    OBJECTIVE IMPAIRMENTS: decreased activity tolerance, decreased mobility, decreased ROM, decreased strength, and increased edema.   ACTIVITY LIMITATIONS: carrying, lifting, sleeping, and reach over head  PARTICIPATION LIMITATIONS: cleaning, community activity, and occupation  PERSONAL FACTORS: 1 comorbidity: SLNB on Rt  are also affecting patient's functional outcome.   REHAB POTENTIAL: Excellent  CLINICAL DECISION MAKING: Stable/uncomplicated  EVALUATION COMPLEXITY: Low  GOALS: Goals reviewed with patient? Yes  SHORT TERM GOALS=LTGs: Target date: 07/23/22  Pt will improve bil shoulder AROM to WNL to allow patient to return to all work and daily activities Baseline: Goal status: INITIAL  2.  Pt will be able to ride the bus to work carrying her things without limitations Baseline:  Goal status: INITIAL  3.  Pt will report feeling ready to go on her mission trip in regards to her shoulder and back  Baseline:  Goal status: INITIAL  PLAN:  PT FREQUENCY: 2x/week  PT DURATION: 4 weeks  PLANNED INTERVENTIONS: Therapeutic exercises, Therapeutic activity, Neuromuscular re-education, Patient/Family education, Self Care, Joint mobilization, Manual therapy, and Re-evaluation  PLAN FOR NEXT SESSION: bil shoulder AAROM/PROM, STM Rt pectoralis Lt?,  include  some MLD for Rt region as needed. - eval back as referral comes back.   Idamae Lusher, PT 07/20/2022, 9:53 AM

## 2022-07-22 ENCOUNTER — Ambulatory Visit: Payer: 59 | Admitting: Rehabilitation

## 2022-07-22 ENCOUNTER — Encounter: Payer: Self-pay | Admitting: Rehabilitation

## 2022-07-22 DIAGNOSIS — M25611 Stiffness of right shoulder, not elsewhere classified: Secondary | ICD-10-CM

## 2022-07-22 DIAGNOSIS — Z853 Personal history of malignant neoplasm of breast: Secondary | ICD-10-CM | POA: Diagnosis not present

## 2022-07-22 DIAGNOSIS — Z9189 Other specified personal risk factors, not elsewhere classified: Secondary | ICD-10-CM

## 2022-07-22 NOTE — Therapy (Signed)
OUTPATIENT PHYSICAL THERAPY  UPPER EXTREMITY ONCOLOGY TREATMENT  Patient Name: Jaime Murillo MRN: [id number] DOB:[birth year], 62 y.o., female Today's Date: 07/22/2022  END OF SESSION:  PT End of Session - 07/22/22 0900     Visit Number 7    Number of Visits 9    Date for PT Re-Evaluation 07/23/22    PT Start Time 0902    PT Stop Time 0945    PT Time Calculation (min) 43 min    Activity Tolerance Patient tolerated treatment well    Behavior During Therapy [facility] for tasks assessed/performed              Past Medical History:  Diagnosis Date   Anemia    Anxiety    Depression    History of cancer chemotherapy 07-17-2012  to  12-04-2012   right breast cancer   HSV infection    Hypertension    Insomnia    Malignant neoplasm of lower-inner quadrant of right breast of female, estrogen receptor negative [facility]) oncologist-  dr Pamelia Hoit--  per lov note in epic ,  no recurrence   dx 05/ 2014---  right breast IDC & DCIS, Grade 3, triple negative----  completed neo-adjuvant chemo 12-04-2012,  03-20-2013  right total mastectomy with sln bx (07-24-2013  left simple mastectomy w/ sln bx)   Neuropathy associated with cancer (HCC)    related to chemotherapy   Neuropathy of both feet    DUE TO CHEMO PER PATIENT   Nocturia    PONV (postoperative nausea and vomiting)    shaking after anesthesia; had n/v after mastectomy   Urinary frequency    Wears glasses    Past Surgical History:  Procedure Laterality Date   AIKEN OSTEOTOMY Right 01/27/2018   Procedure: DOUBLE   Lesly Dukes;  Surgeon: Park Liter, DPM;  Location: [facility];  Service: Podiatry;  Laterality: Right;  POPLITEAL/SAPH BLOCK   BREAST IMPLANT REMOVAL Bilateral 05/14/2022   Procedure: REMOVAL BILATERAL BREAST IMPLANTS;  Surgeon: Glenna Fellows, MD;  Location: [facility];  Service: Plastics;  Laterality: Bilateral;   BREAST LUMPECTOMY Right 01/12/2013   Procedure: RIGHT SENTINEL LYMPH   NODE BIOPSY;  Surgeon: Almond Lint, MD;  Location: [facility];  Service: General;  Laterality: Right;  RIGHT SENTINEL LYMPH NODE BIOPSY   BREAST RECONSTRUCTION WITH PLACEMENT OF TISSUE EXPANDER AND FLEX HD (ACELLULAR HYDRATED DERMIS) Right 03/20/2013   Procedure: IMMEDIATE RIGHT BREAST RECONSTRUCTION WITH PLACEMENT OF TISSUE EXPANDER AND FLEX HD (ACELLULAR HYDRATED DERMIS);  Surgeon: Glenna Fellows, MD;  Location: [facility];  Service: Plastics;  Laterality: Right;   BREAST RECONSTRUCTION WITH PLACEMENT OF TISSUE EXPANDER AND FLEX HD (ACELLULAR HYDRATED DERMIS) Left 07/24/2013   Procedure: BREAST RECONSTRUCTION WITH PLACEMENT OF TISSUE EXPANDER AND FLEX HD (ACELLULAR HYDRATED DERMIS) TO THE LEFT BREAST;  Surgeon: Glenna Fellows, MD;  Location: [facility];  Service: Plastics;  Laterality: Left;   BREAST RECONSTRUCTION WITH PLACEMENT OF TISSUE EXPANDER AND FLEX HD (ACELLULAR HYDRATED DERMIS) Bilateral 01/07/2014   Procedure: BREAST RECONSTRUCTION WITH REMOVAL  OF TISSUE EXPANDER AND PLACEMENT  OF SILICONE IMPLANTS TO BILATERAL BREAST;  Surgeon: Glenna Fellows, MD;  Location: [facility] OR;  Service: Plastics;  Laterality: Bilateral;   CAPSULECTOMY Right 05/14/2022   Procedure: RIGHT CAPSULECTOMY AND REMOVAL IMPLANT MATERIAL RIGHT CHEST;  Surgeon: Glenna Fellows, MD;  Location: [facility];  Service: Plastics;  Laterality: Right;   COLONOSCOPY     LAPAROSCOPIC TUBAL LIGATION Bilateral 08-10-2001  dr Gaynell Face  @[facility]    with cauterization   PLACEMENT OF BREAST IMPLANTS Bilateral 05/14/2022   Procedure: PLACEMENT OF BILATERAL BREAST IMPLANTS;  Surgeon: Glenna Fellows, MD;  Location: [facility];  Service: Plastics;  Laterality: Bilateral;   PORT-A-CATH REMOVAL Left 01/12/2013   Procedure: REMOVAL PORT-A-CATH;  Surgeon: Almond Lint, MD;  Location: [facility];  Service: General;  Laterality: Left;   PORTACATH PLACEMENT N/A 07/06/2012    Procedure: INSERTION PORT-A-CATH;  Surgeon: Almond Lint, MD;  Location: [facility];  Service: General;  Laterality: N/A;   SIMPLE MASTECTOMY WITH AXILLARY SENTINEL NODE BIOPSY Left 07/24/2013   Procedure: SIMPLE MASTECTOMY;  Surgeon: Almond Lint, MD;  Location:  SURGERY CENTER;  Service: General;  Laterality: Left;   TOTAL MASTECTOMY Right 03/20/2013   Procedure: RIGHT SKIN SPARING TOTAL MASTECTOMY;  Surgeon: Almond Lint, MD;  Location: [facility] OR;  Service: General;  Laterality: Right;   TRANSTHORACIC ECHOCARDIOGRAM  07/03/2012   ef 55-60%/  mild MR and TR/  mild dilated IVC   Patient Active Problem List   Diagnosis Date Noted   Women's annual routine gynecological examination 04/07/2020   History of breast cancer 04/07/2020   Screening examination for STD (sexually transmitted disease) 04/07/2020   Acquired absence of breast and nipple 01/07/2014   Personal history of breast cancer 07/11/2013   Fibroid uterus 07/04/2013   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   High blood pressure    Contact lens/glasses fitting    Malignant neoplasm of lower-inner quadrant of right breast of female, estrogen receptor negative (HCC) 06/23/2012   HSV (herpes simplex virus) anogenital infection 06/06/2012   Breast mass, right 06/06/2012   HTN (hypertension) 05/05/2011    PCP: Loura Back, NP  REFERRING PROVIDER: Dr. Leta Baptist  REFERRING DIAG: Post Mastectomy   THERAPY DIAG:  Personal history of breast cancer  At risk for lymphedema  Stiffness of right shoulder, not elsewhere classified  ONSET DATE:   Rationale for Evaluation and Treatment: Rehabilitation  SUBJECTIVE:                                                                                                                                                                                           SUBJECTIVE STATEMENT:  For the first time I am having no pain  PERTINENT HISTORY: Triple negative Rt breast cancer  treated in 2014 with chemo-xrt and then Rt mastectomy with implant reconstruction and 3 neg nodes removed.  Then Lt risk reducing mastectomy with implant.  Palpated nodes in axilla 02/01/22 which showed possible Rt implant rupture. Removal and switch of implants 05/14/22.  Other hx: In February; 03/01/22.  Discectomy and laminectomy in lumbar spine.   - no restrictions for back - [facility]: Dr. Val Eagle  PAIN:  Are you having pain? NO  PRECAUTIONS: Rt lymphedema risk  WEIGHT BEARING RESTRICTIONS: No  FALLS:  Has patient fallen in last 6 months? 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   OBSERVATIONS / OTHER ASSESSMENTS: Incisions well healed   POSTURE: WNL  UPPER EXTREMITY AROM/PROM:  A/PROM RIGHT   eval  07/07/22 07/20/22  Shoulder extension 50 50   Shoulder flexion 85 pn 110  110  Shoulder abduction 70 105 110  Shoulder internal rotation     Shoulder external rotation       (Blank rows = not tested)  A/PROM LEFT   eval 07/07/22 07/20/22  Shoulder extension 35 42   Shoulder flexion 80 105 120  Shoulder abduction 80 90 135  Shoulder internal rotation     Shoulder external rotation       (Blank rows = not tested)  UPPER EXTREMITY STRENGTH: Not tested due to limited ROM  QUICK DASH SURVEY: 68%   TODAY'S TREATMENT:                                                                                                                                           DATE: 07/22/22: Therapeutic Exercises Pulleys into flexion and abduction x each Roll yellow ball up wall into flexion x 5 and abduction x 5 with cueing  Supine dowel flexion x 5  "snow angel" for bil abd x5, alternating flexion x 5, pro/ret x 5, extension presses,  all with TrA activation added for low back, attempted alternating horizontal abduction but this felt too tight Row yellow x 10 Manual Therapy P/ROM to bil shoulders in supine into flexion, abd and D2 to pts available end motions.   07/20/22: Therapeutic Exercises Pulleys into flexion and abduction x 2:59min each with cueing Roll yellow ball up wall into flexion x 5 Education on good back/ general stretch for her trip per below with performance and cueing for each Supine: diaphragmatic breathing x 10, Horz abd x7, "snow angel" for bil abd x5, alternating flexion x 5, pro/ret x 5, extension presses,  all with TrA activation added for low back Manual Therapy P/ROM to bil shoulders in supine into flexion, abd and D2 to pts available end motions.   07/14/22: Therapeutic Exs  Pulleys into flexion and abduction x each with cueing - pt with slow, stiff motions due to increased LBP today Supine over half foam roll for following: Horz abd x 10, bil UE scaption into a "V" 2 x 5 with short rest between sets and then "snow angel" for bil abd x4 then stopped due to increased Lt axillary pain Manual Therapy P/ROM to bil shoulders in supine into flexion, abd and D2 to pts available end motions. Multiple VC's during to relax due to muscle guarding.  STM to Lt> Rt pectoralis insertions during P/ROM where pt palpably tight  07/09/22: Therapeutic Exercises Pulleys into flexion and abduction x each with cueing - pt reports still feeling pull into elbow but less guarding Roll yellow ball up wall into flexion x 10 and then bil abd x 5 each with VC's for correct technique Supine over half foam roll for following: Horz abd x7, stopped  due to reporting increased low back discomfort and then "snow angel" for bil abd x5, still with Lt axillary pain but pt was able to complete sets and cuing for deep breaths throughout for muscle relaxation.  Manual Therapy P/ROM to bil shoulders in supine into flexion, abd and D2 to pts available end motions. Multiple VC's during to relax due to muscle guarding.  STM to bil pectoralis insertions during P/ROM where pt palpably tight; also with coca butter to medial scapular borders where trigger points palpable Scap Mobs in S/L (done bil) into protraction and retraction with multiple VC's to relax   07/07/22: TE: Pulleys into flexion and abduction x each with cueing - pt reports elbow due to guarding Supine dowel chest press and flexion x 8 with guarding and encouragement for relaxation needed Supine chest stretch alternating AROM  Manual Therapy P/ROM to bil shoulders in supine into flexion, abd and D2 to pts available end motions. Multiple VC's during to relax due to muscle guarding.  STM to bil pectoralis insertions during P/ROM where pt palpably tight; also with coca butter to medial scapular borders where trigger points palpable Scap Mobs in S/L (done bil) into protraction and retraction with multiple VC's to relax  06/29/22: Manual Therapy P/ROM to bil shoulders in supine into flexion, abd and D2 to pts available end motions. Multiple VC's during to relax due to muscle guarding.  STM to bil pectoralis insertions during P/ROM where pt palpably tight;   06/25/22: Eval performed PROM into flexion and abd x 5 each with work on Special educational needs teacher on post op HEP to start with addition of forward wall flexion due to bil status and sig guarding   PATIENT EDUCATION:  Education details: POC, initial HEP Person educated: Patient Education method: Programmer, multimedia, Facilities manager, Verbal cues, and Handouts Education comprehension: verbalized understanding  HOME EXERCISE PROGRAM: Post  op Supine dowel flexion or clasped hands  Access Code: [email or web address] URL: [email or web address] Date: 07/20/2022 Prepared by: Gwenevere Abbot  Exercises - Standing Gastroc Stretch  - 1 x daily - 7 x weekly - 1 sets - 3 reps - 20-30 seconds hold - Supine Hamstring Stretch with Strap  - 1 x daily - 7 x weekly - 1 sets - 3 reps - 20-30 seconds hold - Supine Hamstring Stretch  - 1 x daily - 7 x weekly - 1 sets - 3 reps - 20-30 seconds hold - Supine Lower Trunk Rotation  - 1 x daily - 7 x weekly - 1 sets - 3 reps - 20-30 seconds hold - Supine Single Knee  to Chest Stretch  - 1 x daily - 7 x weekly - 1 sets - 3 reps - 20-30 seconds hold  ASSESSMENT:  CLINICAL IMPRESSION:   Pt is feeling much better in the low back after starting some stretches.  She is improving her bil UE AROM but is still limited slightly.  She will leave for [place] after next visit and will F/U with Dr. Leta Baptist regarding implant positioning next week as well. Extended POC for date extension and for recheck visits after trip.   OBJECTIVE IMPAIRMENTS: decreased activity tolerance, decreased mobility, decreased ROM, decreased strength, and increased edema.   ACTIVITY LIMITATIONS: carrying, lifting, sleeping, and reach over head  PARTICIPATION LIMITATIONS: cleaning, community activity, and occupation  PERSONAL FACTORS: 1 comorbidity: SLNB on Rt  are also affecting patient's functional outcome.   REHAB POTENTIAL: Excellent  CLINICAL DECISION MAKING: Stable/uncomplicated  EVALUATION COMPLEXITY: Low  GOALS: Goals reviewed with patient? Yes  SHORT TERM GOALS=LTGs: Target date: 08/19/22  Pt will improve bil shoulder AROM to WNL to allow patient to return to all work and daily activities Baseline: Goal status: MET - pt may not return to work but is ind with ADLs  2.  Pt will be able to ride the bus to work carrying her things without limitations Baseline:  Goal status: DEFERRED  3.  Pt will report feeling ready  to go on her mission trip in regards to her shoulder and back  Baseline:  Goal status: MET  4. Pt will be ind with final HEP for back and shoulder mobility and strength  NEW  PLAN:  PT FREQUENCY: 2x/week  PT DURATION: 4 weeks  PLANNED INTERVENTIONS: Therapeutic exercises, Therapeutic activity, Neuromuscular re-education, Patient/Family education, Self Care, Joint mobilization, Manual therapy, and Re-evaluation  PLAN FOR NEXT SESSION: bil shoulder AAROM/PROM, and may be ready for supine scap soon - eval back as referral comes back.   Idamae Lusher, PT 07/22/2022, 9:44 AM

## 2022-07-23 ENCOUNTER — Encounter: Payer: Self-pay | Admitting: General Practice

## 2022-07-23 ENCOUNTER — Encounter: Payer: 59 | Admitting: General Practice

## 2022-07-23 NOTE — Progress Notes (Signed)
[facility] Spiritual Care Note  Assisted Jaime Murillo with completing her Advance Directives in our [facility] today. She chooses Jaime Murillo [phone]) and Jaime Murillo of [place] Jaime [phone]) as her first and second choices for health care agent.  Original and three copies of forms to Jaime Murillo; one copy to Health Information Management for scanning into electronic medical record.   [address] Rush Barer, [place], [facility] Pager [phone] Voicemail [phone]

## 2022-07-27 ENCOUNTER — Ambulatory Visit: Payer: 59 | Attending: Plastic Surgery

## 2022-07-27 DIAGNOSIS — Z853 Personal history of malignant neoplasm of breast: Secondary | ICD-10-CM | POA: Insufficient documentation

## 2022-07-27 DIAGNOSIS — M25611 Stiffness of right shoulder, not elsewhere classified: Secondary | ICD-10-CM | POA: Insufficient documentation

## 2022-07-27 DIAGNOSIS — Z9189 Other specified personal risk factors, not elsewhere classified: Secondary | ICD-10-CM | POA: Insufficient documentation

## 2022-08-18 ENCOUNTER — Other Ambulatory Visit: Payer: 59

## 2022-08-18 ENCOUNTER — Other Ambulatory Visit: Payer: Self-pay | Admitting: Adult Health

## 2022-08-19 NOTE — H&P (Signed)
Subjective:     Patient ID:    HPI   3.5 months post op removal replacement implants for right extracapsular rupture. This included change from microtextured device to smooth implants. Patient has developed right implant displacement and plan revision surgery to address this next month.   Noted at Oncology follow up 1.2024 to have axillary mass. MMG/US as below felt area likely silicone. Subsequent MRI demonstrated right rupture. Also noted were two axillary LN and three internal mammary LN thought to contain silicone.    Completed neoadjuvant treatment 11/2012 for a right-sided breast cancer that wastriple negative. Responded to chemotherapy and resolved radiographically.  SLN bx done prior to mastectomy and this was negative. Underwent right mastectomy with immediate dual plane TE placement. Final pathology from this with no residual carcinoma. Following this, she elected for left risk reduction mastetcomy and immediate dual plane TE placement. Left breast pathology with ALH.   34 B size prior. 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      Objective:   Physical Exam   CV: normal heart sounds normal rhythm Pulm: clear to auscultation   Chest: scars maturing,  Bilateral animation Bilateral rippling present  Right implant inferior displacement In supine position appr 2 cm lateral movement     Assessment:     Hx R breast cancer , family history breast cancer, dense breasts Neoadjuvant chemotherapy S/p right SLN S/p right areola sparing mastectomy, dual plane TE/ADM (FlexHD) reconstruction S/p left areola sparing mastectomy, dual plane TE/ADM (FlexHD) reconstruction S/p bilateral silicone implant exchange (microtextured anatomic) Right extracapsular implant rupture S/p right capsulectomy, removal ruptured implant, silicone implant placement bilateral (smooth round) Right implant displacement inferior   Plan:    Has  developed inferior displacement implant over right. Plan revision right reconstruction with silicone implant exchange, capsulorraphy, possible acellular dermis. Reviewed cadaveric source latter, off label use in breast reconstruction. Reviewed if not incorporated ADM can be nidus for infection, seroma. Reviewed ADM can also stretch out and similar displacement can occur. Plan   Additional risks including but not limited to bleeding, hematoma, seroma, need for additional procedures, damage to adjacent structures, asymmetry, blood clots in legs or lungs, unacceptable cosmetic result reviewed.   Completed Eugenie Filler physician patient checklist. Provided drain log.   Rx for bactrim  robaxin and oxycodone given.    Natrelle Soft Touch Extra Projection Smooth Round 375 ml implants placed bilateral. REF [id number]  (Prior implants: Mentor Memory Shape 345 ml (Medium Height, High Projection) anatomic implants bilaterally)   Glenna Fellows, MD MBA Plastic & Reconstructive Surgery  Office/ physician access line after hours [phone]

## 2022-08-27 ENCOUNTER — Encounter: Payer: Self-pay | Admitting: Rehabilitation

## 2022-08-27 ENCOUNTER — Encounter (HOSPITAL_BASED_OUTPATIENT_CLINIC_OR_DEPARTMENT_OTHER): Payer: Self-pay | Admitting: Plastic Surgery

## 2022-08-27 ENCOUNTER — Ambulatory Visit: Payer: 59 | Attending: Plastic Surgery | Admitting: Rehabilitation

## 2022-08-27 DIAGNOSIS — Z9189 Other specified personal risk factors, not elsewhere classified: Secondary | ICD-10-CM | POA: Insufficient documentation

## 2022-08-27 DIAGNOSIS — M25611 Stiffness of right shoulder, not elsewhere classified: Secondary | ICD-10-CM | POA: Diagnosis present

## 2022-08-27 DIAGNOSIS — Z853 Personal history of malignant neoplasm of breast: Secondary | ICD-10-CM | POA: Diagnosis not present

## 2022-08-27 NOTE — Therapy (Signed)
OUTPATIENT PHYSICAL THERAPY  UPPER EXTREMITY ONCOLOGY TREATMENT  Patient Name: Jaime Murillo MRN: [id number] DOB:09/24/1960, 62 y.o., female Today's Date: 08/27/2022  END OF SESSION:  PT End of Session - 08/27/22 0946     Visit Number 8    Number of Visits 15    Date for PT Re-Evaluation 10/22/22    PT Start Time 0950    PT Stop Time 1038    PT Time Calculation (min) 48 min    Activity Tolerance Patient tolerated treatment well    Behavior During Therapy [facility] for tasks assessed/performed              Past Medical History:  Diagnosis Date   Anemia    Anxiety    Depression    History of cancer chemotherapy 07-17-2012  to  12-04-2012   right breast cancer   HSV infection    Hypertension    Insomnia    Malignant neoplasm of lower-inner quadrant of right breast of female, estrogen receptor negative [facility]) oncologist-  dr Pamelia Hoit--  per lov note in epic ,  no recurrence   dx 05/ 2014---  right breast IDC & DCIS, Grade 3, triple negative----  completed neo-adjuvant chemo 12-04-2012,  03-20-2013  right total mastectomy with sln bx (07-24-2013  left simple mastectomy w/ sln bx)   Neuropathy associated with cancer (HCC)    related to chemotherapy   Neuropathy of both feet    DUE TO CHEMO PER PATIENT   Nocturia    PONV (postoperative nausea and vomiting)    shaking after anesthesia; had n/v after mastectomy   Urinary frequency    Wears glasses    Past Surgical History:  Procedure Laterality Date   AIKEN OSTEOTOMY Right 01/27/2018   Procedure: DOUBLE   Lesly Dukes;  Surgeon: Park Liter, DPM;  Location: [facility];  Service: Podiatry;  Laterality: Right;  POPLITEAL/SAPH BLOCK   BREAST IMPLANT REMOVAL Bilateral 05/14/2022   Procedure: REMOVAL BILATERAL BREAST IMPLANTS;  Surgeon: Glenna Fellows, MD;  Location: [facility];  Service: Plastics;  Laterality: Bilateral;   BREAST LUMPECTOMY Right 01/12/2013   Procedure: RIGHT SENTINEL LYMPH   NODE BIOPSY;  Surgeon: Almond Lint, MD;  Location: [facility];  Service: General;  Laterality: Right;  RIGHT SENTINEL LYMPH NODE BIOPSY   BREAST RECONSTRUCTION WITH PLACEMENT OF TISSUE EXPANDER AND FLEX HD (ACELLULAR HYDRATED DERMIS) Right 03/20/2013   Procedure: IMMEDIATE RIGHT BREAST RECONSTRUCTION WITH PLACEMENT OF TISSUE EXPANDER AND FLEX HD (ACELLULAR HYDRATED DERMIS);  Surgeon: Glenna Fellows, MD;  Location: [facility] OR;  Service: Plastics;  Laterality: Right;   BREAST RECONSTRUCTION WITH PLACEMENT OF TISSUE EXPANDER AND FLEX HD (ACELLULAR HYDRATED DERMIS) Left 07/24/2013   Procedure: BREAST RECONSTRUCTION WITH PLACEMENT OF TISSUE EXPANDER AND FLEX HD (ACELLULAR HYDRATED DERMIS) TO THE LEFT BREAST;  Surgeon: Glenna Fellows, MD;  Location: [facility];  Service: Plastics;  Laterality: Left;   BREAST RECONSTRUCTION WITH PLACEMENT OF TISSUE EXPANDER AND FLEX HD (ACELLULAR HYDRATED DERMIS) Bilateral 01/07/2014   Procedure: BREAST RECONSTRUCTION WITH REMOVAL  OF TISSUE EXPANDER AND PLACEMENT  OF SILICONE IMPLANTS TO BILATERAL BREAST;  Surgeon: Glenna Fellows, MD;  Location: [facility] OR;  Service: Plastics;  Laterality: Bilateral;   CAPSULECTOMY Right 05/14/2022   Procedure: RIGHT CAPSULECTOMY AND REMOVAL IMPLANT MATERIAL RIGHT CHEST;  Surgeon: Glenna Fellows, MD;  Location: [facility];  Service: Plastics;  Laterality: Right;   COLONOSCOPY     LAPAROSCOPIC TUBAL LIGATION Bilateral 08-10-2001  dr Gaynell Face  @[facility]    with cauterization   PLACEMENT OF BREAST IMPLANTS Bilateral 05/14/2022   Procedure: PLACEMENT OF BILATERAL BREAST IMPLANTS;  Surgeon: Glenna Fellows, MD;  Location: [facility];  Service: Plastics;  Laterality: Bilateral;   PORT-A-CATH REMOVAL Left 01/12/2013   Procedure: REMOVAL PORT-A-CATH;  Surgeon: Almond Lint, MD;  Location: [facility];  Service: General;  Laterality: Left;   PORTACATH PLACEMENT N/A 07/06/2012    Procedure: INSERTION PORT-A-CATH;  Surgeon: Almond Lint, MD;  Location: [facility];  Service: General;  Laterality: N/A;   SIMPLE MASTECTOMY WITH AXILLARY SENTINEL NODE BIOPSY Left 07/24/2013   Procedure: SIMPLE MASTECTOMY;  Surgeon: Almond Lint, MD;  Location: [facility];  Service: General;  Laterality: Left;   TOTAL MASTECTOMY Right 03/20/2013   Procedure: RIGHT SKIN SPARING TOTAL MASTECTOMY;  Surgeon: Almond Lint, MD;  Location: [facility];  Service: General;  Laterality: Right;   TRANSTHORACIC ECHOCARDIOGRAM  07/03/2012   ef 55-60%/  mild MR and TR/  mild dilated IVC   Patient Active Problem List   Diagnosis Date Noted   Women's annual routine gynecological examination 04/07/2020   History of breast cancer 04/07/2020   Screening examination for STD (sexually transmitted disease) 04/07/2020   Acquired absence of breast and nipple 01/07/2014   Personal history of breast cancer 07/11/2013   Fibroid uterus 07/04/2013   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   High blood pressure    Contact lens/glasses fitting    Malignant neoplasm of lower-inner quadrant of right breast of female, estrogen receptor negative (HCC) 06/23/2012   HSV (herpes simplex virus) anogenital infection 06/06/2012   Breast mass, right 06/06/2012   HTN (hypertension) 05/05/2011    PCP: Loura Back, NP  REFERRING PROVIDER: Dr. Leta Baptist  REFERRING DIAG: Post Mastectomy   THERAPY DIAG:  Personal history of breast cancer  At risk for lymphedema  Stiffness of right shoulder, not elsewhere classified  ONSET DATE:   Rationale for Evaluation and Treatment: Rehabilitation  SUBJECTIVE:                                                                                                                                                                                           SUBJECTIVE STATEMENT: My low back and arm were fine during the trip but now my low back is feeling tight since I have  been back.  I saw my back surgeon and she said I could use steroids if I needed to.  I still have the mm relaxers.  On 09/03/22 Dr. Leta Baptist will replace the Rt implant and lift it with a pocket.  I decided not  to go back to work.   PERTINENT HISTORY: Triple negative Rt breast cancer treated in 2014 with chemo-xrt and then Rt mastectomy with implant reconstruction and 3 neg nodes removed.  Then Lt risk reducing mastectomy with implant.  Palpated nodes in axilla 02/01/22 which showed possible Rt implant rupture. Removal and switch of implants 05/14/22.  Other hx: In February; 03/01/22. Discectomy and laminectomy in lumbar spine.   - no restrictions for back - [facility]: Dr. Val Eagle  PAIN:  Are you having pain? YES 2/10 low back Muscle tightness  PRECAUTIONS: Rt lymphedema risk  WEIGHT BEARING RESTRICTIONS: No  FALLS:  Has patient fallen in last 6 months? 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   OBSERVATIONS / OTHER ASSESSMENTS: Incisions well healed   POSTURE: WNL  UPPER EXTREMITY AROM/PROM:  A/PROM RIGHT   eval  07/07/22 07/20/22 08/27/22  Shoulder extension 50 50    Shoulder flexion 85 pn 110  110 125 - tight in back  Shoulder abduction 70 105 110 115   Shoulder internal rotation      Shoulder external rotation        (Blank rows = not tested)  A/PROM LEFT   eval 07/07/22 07/20/22 08/27/22  Shoulder extension 35 42    Shoulder flexion 80 105 120 120  Shoulder abduction 80 90  135 115  Shoulder internal rotation      Shoulder external rotation        (Blank rows = not tested)  Lumbar AROM: flexion: 85% with flat back and tightness, Ext: full but tight, Rot bil: full but tight,  Lat Flex: full but tight  Sits in APT and has some increased pain with APT vs PPT  UPPER EXTREMITY STRENGTH:   QUICK DASH SURVEY: 68% on EVAL, 31% on 08/27/22   TODAY'S TREATMENT:                                                                                                                                          DATE: 08/27/22 Recheck x Therapeutic Exercises Pulleys into flexion and abduction x each Roll yellow ball up wall into flexion x 5 Supine TrA education: cueing to pull belly button towards the spine, or like a bowling ball is going to drop but still breathing, the TrA added to all shoulder stretches Supine dowel flexion x 5 with flat back  cueing  "snow angel" for bil abd x5, alternating flexion x 5, all with TrA activation added for low back, alt horizontal abduction Supine Hamstring Stretch with Strap - 3 reps - 20-30 seconds hold Supine Hamstring Stretch  - 1 x daily - 7 x weekly - 1 sets - 3 reps - 20-30 seconds holdS Supine Lower Trunk Rotation - 3 reps Supine Single Knee to Chest Stretch  - 3 reps - 20-30 seconds hold bil Seated posture education to decrease back pain - pts sits in more of an APT so we worked on sitting around half way between APT and PPT.   07/22/22: Therapeutic Exercises Pulleys into flexion and abduction x each Roll yellow ball up wall into flexion x 5 and abduction x 5 with cueing  Supine dowel flexion x 5  "snow angel" for bil abd x5, alternating flexion x 5, pro/ret x 5, extension presses,  all with TrA activation added for low back, attempted alternating horizontal abduction but this felt too tight Row yellow x 10 Manual Therapy P/ROM to bil shoulders in supine into flexion, abd and D2 to pts available end motions.    07/20/22: Therapeutic Exercises Pulleys into flexion and abduction x 2:60min each with cueing Roll yellow ball up wall into flexion x 5 Education on good back/ general stretch for her trip per below with performance and cueing for each Supine: diaphragmatic breathing x 10, Horz abd x7, "snow angel" for bil abd x5, alternating flexion x 5, pro/ret x 5, extension presses,  all with TrA activation added for low back Manual Therapy P/ROM to bil shoulders in supine into flexion, abd and D2 to pts available end motions.   PATIENT EDUCATION:  Education details: POC, initial HEP Person educated: Patient Education method: Programmer, multimedia, Demonstration, Verbal cues, and Handouts Education comprehension: verbalized understanding  HOME EXERCISE PROGRAM: Post op Supine dowel flexion or clasped hands   Access Code: [email or web address] URL: [email or web address] Date: 07/20/2022 Prepared by: Gwenevere Abbot  Exercises - Standing Gastroc Stretch  - 1 x daily - 7 x weekly - 1 sets - 3 reps - 20-30 seconds hold - Supine Hamstring Stretch with Strap  - 1 x daily - 7 x weekly - 1 sets - 3 reps - 20-30 seconds hold - Supine Hamstring Stretch  - 1 x daily - 7 x weekly - 1 sets - 3 reps - 20-30 seconds hold - Supine Lower Trunk Rotation  - 1 x daily - 7 x weekly - 1 sets - 3 reps - 20-30 seconds hold - Supine Single Knee to Chest Stretch  - 1 x daily - 7 x weekly - 1 sets - 3 reps - 20-30 seconds hold  ASSESSMENT:  CLINICAL IMPRESSION:   Pt returns after trip to [place] x 1 month.  She said she had minimal back or shoulder pain during the trip but now that she has been home her back is hurting a bit.  We determined it may be now getting back to more seated desk activities and sitting in APT.  Her shoulder AROM is still very limited bilaterally.  Her Rt implant is falling down the chest and she will have it removed on 09/03/22 but wants to have 2 sessions before the surgery to feel looser in the back and  shoulders.  She is now around 6 months post lumbar laminectomy and has no restrictions and we are combining her POC into this re-cert as her back surgeon referral was closed.  We will continued to  focus on bil shoulder AROM, lumbar flexibility and AROM, and core strength.   OBJECTIVE IMPAIRMENTS: decreased activity tolerance, decreased mobility, decreased ROM, decreased strength, and increased edema.   ACTIVITY LIMITATIONS: carrying, lifting, sleeping, and reach over head  PARTICIPATION LIMITATIONS: cleaning, community activity, and occupation  PERSONAL FACTORS: 1 comorbidity: SLNB on Rt  are also affecting patient's functional outcome.   REHAB POTENTIAL: Excellent  CLINICAL DECISION MAKING: Stable/uncomplicated  EVALUATION COMPLEXITY: Low  GOALS: Goals reviewed with patient? Yes  SHORT TERM GOALS=LTGs: Target date: 10/22/22  Pt will improve bil shoulder AROM to WNL to allow patient to return to all work and daily activities Baseline: Goal status: MET - pt may not return to work but is ind with ADLs  2.  Pt will be able to ride the bus to work carrying her things without limitations Baseline:  Goal status: DEFERRED  3.  Pt will report feeling ready to go on her mission trip in regards to her shoulder and back  Baseline:  Goal status: MET  4. Pt will be ind with final HEP for back and shoulder mobility and strength  NEW  PLAN:  PT FREQUENCY: 2x/week  PT DURATION: 8 weeks with break for implant exchange.    PLANNED INTERVENTIONS: Therapeutic exercises, Therapeutic activity, Neuromuscular re-education, Patient/Family education, Self Care, Joint mobilization, Manual therapy, and Re-evaluation, heat/ice for back as needed.   PLAN FOR NEXT SESSION: bil shoulder ROM, lumbar flexibility, start easy core and activation work, may like warm up on bike?   Idamae Lusher, PT 08/27/2022, 10:41 AM

## 2022-08-30 ENCOUNTER — Encounter (HOSPITAL_BASED_OUTPATIENT_CLINIC_OR_DEPARTMENT_OTHER)
Admission: RE | Admit: 2022-08-30 | Discharge: 2022-08-30 | Disposition: A | Discharge status: Home / Self Care | Payer: 59 | Source: Ambulatory Visit | Attending: Plastic Surgery | Admitting: Plastic Surgery

## 2022-08-30 DIAGNOSIS — Z01818 Encounter for other preprocedural examination: Secondary | ICD-10-CM | POA: Insufficient documentation

## 2022-08-30 DIAGNOSIS — Z853 Personal history of malignant neoplasm of breast: Secondary | ICD-10-CM | POA: Diagnosis not present

## 2022-08-30 DIAGNOSIS — Y831 Surgical operation with implant of artificial internal device as the cause of abnormal reaction of the patient, or of later complication, without mention of misadventure at the time of the procedure: Secondary | ICD-10-CM | POA: Diagnosis not present

## 2022-08-30 DIAGNOSIS — T8542XA Displacement of breast prosthesis and implant, initial encounter: Secondary | ICD-10-CM | POA: Diagnosis present

## 2022-08-30 DIAGNOSIS — Z803 Family history of malignant neoplasm of breast: Secondary | ICD-10-CM | POA: Diagnosis not present

## 2022-08-30 DIAGNOSIS — Z9013 Acquired absence of bilateral breasts and nipples: Secondary | ICD-10-CM | POA: Diagnosis not present

## 2022-08-30 LAB — BASIC METABOLIC PANEL
Anion gap: 8 (ref 5–15)
BUN: 17 mg/dL (ref 8–23)
CO2: 29 mmol/L (ref 22–32)
Calcium: 9.2 mg/dL (ref 8.9–10.3)
Chloride: 99 mmol/L (ref 98–111)
Creatinine, Ser: 0.73 mg/dL (ref 0.44–1.00)
GFR, Estimated: >60 mL/min (ref >60)
Glucose, Bld: 82 mg/dL (ref 70–99)
Potassium: 3.6 mmol/L (ref 3.5–5.1)
Sodium: 136 mmol/L (ref 135–145)

## 2022-08-30 MED ORDER — CHLORHEXIDINE GLUCONATE CLOTH 2 % EX PADS: 6.0000 | MEDICATED_PAD | Freq: Once | CUTANEOUS | Status: DC

## 2022-08-30 NOTE — Progress Notes (Signed)

## 2022-08-31 ENCOUNTER — Ambulatory Visit (HOSPITAL_BASED_OUTPATIENT_CLINIC_OR_DEPARTMENT_OTHER): Payer: 59 | Admitting: Anesthesiology

## 2022-08-31 ENCOUNTER — Other Ambulatory Visit: Payer: Self-pay

## 2022-08-31 ENCOUNTER — Encounter (HOSPITAL_BASED_OUTPATIENT_CLINIC_OR_DEPARTMENT_OTHER)
Admission: RE | Disposition: A | Discharge status: Home / Self Care | Payer: Self-pay | Source: Home / Self Care | Attending: Plastic Surgery

## 2022-08-31 ENCOUNTER — Encounter (HOSPITAL_BASED_OUTPATIENT_CLINIC_OR_DEPARTMENT_OTHER): Payer: Self-pay | Admitting: Plastic Surgery

## 2022-08-31 ENCOUNTER — Ambulatory Visit (HOSPITAL_BASED_OUTPATIENT_CLINIC_OR_DEPARTMENT_OTHER)
Admission: RE | Admit: 2022-08-31 | Discharge: 2022-08-31 | Disposition: A | Discharge status: Home / Self Care | Payer: 59 | Attending: Plastic Surgery | Admitting: Plastic Surgery

## 2022-08-31 DIAGNOSIS — Z9011 Acquired absence of right breast and nipple: Secondary | ICD-10-CM | POA: Diagnosis not present

## 2022-08-31 DIAGNOSIS — T8549XA Other mechanical complication of breast prosthesis and implant, initial encounter: Secondary | ICD-10-CM | POA: Diagnosis not present

## 2022-08-31 DIAGNOSIS — Z853 Personal history of malignant neoplasm of breast: Secondary | ICD-10-CM | POA: Insufficient documentation

## 2022-08-31 DIAGNOSIS — Y831 Surgical operation with implant of artificial internal device as the cause of abnormal reaction of the patient, or of later complication, without mention of misadventure at the time of the procedure: Secondary | ICD-10-CM | POA: Insufficient documentation

## 2022-08-31 DIAGNOSIS — Z01818 Encounter for other preprocedural examination: Secondary | ICD-10-CM

## 2022-08-31 DIAGNOSIS — T8542XA Displacement of breast prosthesis and implant, initial encounter: Secondary | ICD-10-CM | POA: Insufficient documentation

## 2022-08-31 DIAGNOSIS — Z803 Family history of malignant neoplasm of breast: Secondary | ICD-10-CM | POA: Insufficient documentation

## 2022-08-31 DIAGNOSIS — Z9013 Acquired absence of bilateral breasts and nipples: Secondary | ICD-10-CM | POA: Insufficient documentation

## 2022-08-31 HISTORY — PX: ALLOGRAFT APPLICATION: SHX6404

## 2022-08-31 HISTORY — PX: BREAST CAPSULECTOMY WITH IMPLANT EXCHANGE: SHX5592

## 2022-08-31 SURGERY — CAPSULECTOMY, BREAST, WITH REPLACEMENT OF IMPLANT
Anesthesia: General | Site: Chest | Laterality: Right

## 2022-08-31 MED ORDER — SODIUM CHLORIDE 0.9 % IV SOLN
INTRAVENOUS | Status: AC
  Filled 2022-08-31: qty 10

## 2022-08-31 MED ORDER — GABAPENTIN 300 MG PO CAPS
ORAL_CAPSULE | ORAL | Status: AC
  Filled 2022-08-31: qty 1

## 2022-08-31 MED ORDER — ACETAMINOPHEN 500 MG PO TABS
1000.0000 mg | ORAL_TABLET | ORAL | Status: AC
  Administered 2022-08-31: 1000 mg via ORAL

## 2022-08-31 MED ORDER — CELECOXIB 200 MG PO CAPS
ORAL_CAPSULE | ORAL | Status: AC
  Filled 2022-08-31: qty 1

## 2022-08-31 MED ORDER — HYDROMORPHONE HCL 1 MG/ML IJ SOLN
INTRAMUSCULAR | Status: AC
  Filled 2022-08-31: qty 0.5

## 2022-08-31 MED ORDER — HYDROMORPHONE HCL 1 MG/ML IJ SOLN
0.2500 mg | INTRAMUSCULAR | Status: DC | PRN
  Administered 2022-08-31 (×2): 0.5 mg via INTRAVENOUS

## 2022-08-31 MED ORDER — CEFAZOLIN SODIUM-DEXTROSE 2-4 GM/100ML-% IV SOLN
INTRAVENOUS | Status: AC
  Filled 2022-08-31: qty 100

## 2022-08-31 MED ORDER — POVIDONE-IODINE 10 % EX SOLN
CUTANEOUS | Status: DC | PRN
  Administered 2022-08-31: 1 via TOPICAL

## 2022-08-31 MED ORDER — MEPERIDINE HCL 25 MG/ML IJ SOLN: 6.2500 mg | INTRAMUSCULAR | Status: DC | PRN

## 2022-08-31 MED ORDER — PROPOFOL 10 MG/ML IV BOLUS
INTRAVENOUS | Status: DC | PRN
Start: 2022-08-31 — End: 2022-08-31
  Administered 2022-08-31: 150 mg via INTRAVENOUS

## 2022-08-31 MED ORDER — ACETAMINOPHEN 500 MG PO TABS
ORAL_TABLET | ORAL | Status: AC
  Filled 2022-08-31: qty 2

## 2022-08-31 MED ORDER — CELECOXIB 200 MG PO CAPS
200.0000 mg | ORAL_CAPSULE | ORAL | Status: AC
  Administered 2022-08-31: 200 mg via ORAL

## 2022-08-31 MED ORDER — DEXAMETHASONE SODIUM PHOSPHATE 4 MG/ML IJ SOLN
INTRAMUSCULAR | Status: DC | PRN
  Administered 2022-08-31: 5 mg via INTRAVENOUS

## 2022-08-31 MED ORDER — 0.9 % SODIUM CHLORIDE (POUR BTL) OPTIME
TOPICAL | Status: DC | PRN
  Administered 2022-08-31: 300 mL

## 2022-08-31 MED ORDER — BUPIVACAINE HCL (PF) 0.5 % IJ SOLN
INTRAMUSCULAR | Status: DC | PRN
Start: 2022-08-31 — End: 2022-08-31
  Administered 2022-08-31: 20 mL

## 2022-08-31 MED ORDER — OXYCODONE HCL 5 MG/5ML PO SOLN: 5.0000 mg | Freq: Once | ORAL | Status: DC | PRN

## 2022-08-31 MED ORDER — ATROPINE SULFATE 0.4 MG/ML IV SOLN
INTRAVENOUS | Status: AC
  Filled 2022-08-31: qty 1

## 2022-08-31 MED ORDER — LACTATED RINGERS IV SOLN: INTRAVENOUS | Status: DC

## 2022-08-31 MED ORDER — ONDANSETRON HCL 4 MG/2ML IJ SOLN
INTRAMUSCULAR | Status: DC | PRN
  Administered 2022-08-31: 4 mg via INTRAVENOUS

## 2022-08-31 MED ORDER — EPHEDRINE 5 MG/ML INJ
INTRAVENOUS | Status: AC
  Filled 2022-08-31: qty 5

## 2022-08-31 MED ORDER — FENTANYL CITRATE (PF) 100 MCG/2ML IJ SOLN
INTRAMUSCULAR | Status: DC | PRN
  Administered 2022-08-31: 50 ug via INTRAVENOUS

## 2022-08-31 MED ORDER — PROMETHAZINE HCL 25 MG/ML IJ SOLN: 6.2500 mg | INTRAMUSCULAR | Status: DC | PRN

## 2022-08-31 MED ORDER — OXYCODONE HCL 5 MG PO TABS: 5.0000 mg | ORAL_TABLET | Freq: Once | ORAL | Status: DC | PRN

## 2022-08-31 MED ORDER — PHENYLEPHRINE 80 MCG/ML (10ML) SYRINGE FOR IV PUSH (FOR BLOOD PRESSURE SUPPORT)
PREFILLED_SYRINGE | INTRAVENOUS | Status: AC
  Filled 2022-08-31: qty 10

## 2022-08-31 MED ORDER — SODIUM CHLORIDE 0.9 % IV SOLN
INTRAVENOUS | Status: DC | PRN
  Administered 2022-08-31: 500 mL

## 2022-08-31 MED ORDER — ROCURONIUM BROMIDE 10 MG/ML (PF) SYRINGE
PREFILLED_SYRINGE | INTRAVENOUS | Status: AC
  Filled 2022-08-31: qty 10

## 2022-08-31 MED ORDER — SUGAMMADEX SODIUM 200 MG/2ML IV SOLN
INTRAVENOUS | Status: DC | PRN
Start: 2022-08-31 — End: 2022-08-31
  Administered 2022-08-31: 200 mg via INTRAVENOUS

## 2022-08-31 MED ORDER — BUPIVACAINE HCL (PF) 0.5 % IJ SOLN
INTRAMUSCULAR | Status: AC
  Filled 2022-08-31: qty 30

## 2022-08-31 MED ORDER — LIDOCAINE 2% (20 MG/ML) 5 ML SYRINGE
INTRAMUSCULAR | Status: AC
  Filled 2022-08-31: qty 5

## 2022-08-31 MED ORDER — CEFAZOLIN SODIUM-DEXTROSE 2-4 GM/100ML-% IV SOLN
2.0000 g | INTRAVENOUS | Status: AC
  Administered 2022-08-31: 2 g via INTRAVENOUS

## 2022-08-31 MED ORDER — ONDANSETRON HCL 4 MG/2ML IJ SOLN
INTRAMUSCULAR | Status: AC
  Filled 2022-08-31: qty 2

## 2022-08-31 MED ORDER — ROCURONIUM BROMIDE 100 MG/10ML IV SOLN
INTRAVENOUS | Status: DC | PRN
  Administered 2022-08-31: 60 mg via INTRAVENOUS

## 2022-08-31 MED ORDER — LIDOCAINE HCL (CARDIAC) PF 100 MG/5ML IV SOSY
PREFILLED_SYRINGE | INTRAVENOUS | Status: DC | PRN
  Administered 2022-08-31 (×2): 50 mg via INTRAVENOUS

## 2022-08-31 MED ORDER — SUCCINYLCHOLINE CHLORIDE 200 MG/10ML IV SOSY
PREFILLED_SYRINGE | INTRAVENOUS | Status: AC
  Filled 2022-08-31: qty 10

## 2022-08-31 MED ORDER — GABAPENTIN 300 MG PO CAPS: 300.0000 mg | ORAL_CAPSULE | ORAL | Status: DC

## 2022-08-31 MED ORDER — FENTANYL CITRATE (PF) 100 MCG/2ML IJ SOLN
INTRAMUSCULAR | Status: AC
  Filled 2022-08-31: qty 2

## 2022-08-31 MED ORDER — DEXAMETHASONE SODIUM PHOSPHATE 10 MG/ML IJ SOLN
INTRAMUSCULAR | Status: AC
  Filled 2022-08-31: qty 1

## 2022-08-31 SURGICAL SUPPLY — 77 items
ADH SKN CLS APL DERMABOND .7 (GAUZE/BANDAGES/DRESSINGS) ×1
ALLODERM 8X16 MED THICK (Tissue) ×1 IMPLANT
APL PRP STRL LF DISP 70% ISPRP (MISCELLANEOUS) ×1
BAG DECANTER FOR FLEXI CONT (MISCELLANEOUS) ×1 IMPLANT
BINDER BREAST LRG (GAUZE/BANDAGES/DRESSINGS) IMPLANT
BINDER BREAST MEDIUM (GAUZE/BANDAGES/DRESSINGS) IMPLANT
BINDER BREAST XLRG (GAUZE/BANDAGES/DRESSINGS) IMPLANT
BINDER BREAST XXLRG (GAUZE/BANDAGES/DRESSINGS) IMPLANT
BLADE SURG 10 STRL SS (BLADE) ×1 IMPLANT
BLADE SURG 15 STRL LF DISP TIS (BLADE) ×1 IMPLANT
BLADE SURG 15 STRL SS (BLADE) ×1
BNDG GAUZE DERMACEA FLUFF 4 (GAUZE/BANDAGES/DRESSINGS) IMPLANT
BNDG GZE DERMACEA 4 6PLY (GAUZE/BANDAGES/DRESSINGS)
CANISTER SUCT 1200ML W/VALVE (MISCELLANEOUS) ×1 IMPLANT
CHLORAPREP W/TINT 26 (MISCELLANEOUS) ×1 IMPLANT
COVER BACK TABLE 60X90IN (DRAPES) ×1 IMPLANT
COVER MAYO STAND STRL (DRAPES) ×1 IMPLANT
DERMABOND ADVANCED .7 DNX12 (GAUZE/BANDAGES/DRESSINGS) ×1 IMPLANT
DRAIN CHANNEL 15F RND FF W/TCR (WOUND CARE) IMPLANT
DRAIN CHANNEL 19F RND (DRAIN) IMPLANT
DRAPE INCISE IOBAN 66X45 STRL (DRAPES) IMPLANT
DRAPE TOP ARMCOVERS (MISCELLANEOUS) ×1 IMPLANT
DRAPE U-SHAPE 76X120 STRL (DRAPES) ×1 IMPLANT
DRAPE UTILITY XL STRL (DRAPES) ×1 IMPLANT
DRSG TEGADERM 2-3/8X2-3/4 SM (GAUZE/BANDAGES/DRESSINGS) IMPLANT
DRSG TEGADERM 4X10 (GAUZE/BANDAGES/DRESSINGS) IMPLANT
ELECT BLADE 4.0 EZ CLEAN MEGAD (MISCELLANEOUS)
ELECT COATED BLADE 2.86 ST (ELECTRODE) ×1 IMPLANT
ELECT REM PT RETURN 9FT ADLT (ELECTROSURGICAL) ×1
ELECTRODE BLDE 4.0 EZ CLN MEGD (MISCELLANEOUS) ×1 IMPLANT
ELECTRODE REM PT RTRN 9FT ADLT (ELECTROSURGICAL) ×1 IMPLANT
EVACUATOR SILICONE 100CC (DRAIN) IMPLANT
GAUZE PAD ABD 8X10 STRL (GAUZE/BANDAGES/DRESSINGS) ×2 IMPLANT
GLOVE BIO SURGEON STRL SZ 6 (GLOVE) ×1 IMPLANT
GLOVE BIO SURGEON STRL SZ8 (GLOVE) IMPLANT
GLOVE BIOGEL PI IND STRL 7.0 (GLOVE) IMPLANT
GLOVE BIOGEL PI IND STRL 8.5 (GLOVE) IMPLANT
GOWN STRL REUS W/ TWL LRG LVL3 (GOWN DISPOSABLE) ×2 IMPLANT
GOWN STRL REUS W/ TWL XL LVL3 (GOWN DISPOSABLE) IMPLANT
GOWN STRL REUS W/TWL LRG LVL3 (GOWN DISPOSABLE) ×2
GOWN STRL REUS W/TWL XL LVL3 (GOWN DISPOSABLE) ×1
IMPL BREAST P5.6XRND XFULL 375 (Breast) IMPLANT
IMPL BRST P5.6XRND XFULL 375CC (Breast) ×1 IMPLANT
IMPLANT BREAST GEL 375CC (Breast) ×1 IMPLANT
MARKER SKIN DUAL TIP RULER LAB (MISCELLANEOUS) IMPLANT
NDL HYPO 25X1 1.5 SAFETY (NEEDLE) IMPLANT
NDL SAFETY ECLIP 18X1.5 (MISCELLANEOUS) ×1 IMPLANT
NEEDLE HYPO 25X1 1.5 SAFETY (NEEDLE) ×1
PENCIL SMOKE EVACUATOR (MISCELLANEOUS) ×1 IMPLANT
PIN SAFETY STERILE (MISCELLANEOUS) IMPLANT
PUNCH BIOPSY 4MM DISP (MISCELLANEOUS) IMPLANT
PUNCH BIOPSY DERMAL 5MM STRL (MISCELLANEOUS) IMPLANT
SHEET MEDIUM DRAPE 40X70 STRL (DRAPES) ×1 IMPLANT
SLEEVE SCD COMPRESS KNEE MED (STOCKING) ×1 IMPLANT
SPONGE T-LAP 18X18 ~~LOC~~+RFID (SPONGE) ×2 IMPLANT
STAPLER VISISTAT 35W (STAPLE) ×1 IMPLANT
SUT CHROMIC 4 0 PS 2 18 (SUTURE) IMPLANT
SUT ETHILON 2 0 FS 18 (SUTURE) ×1 IMPLANT
SUT MNCRL AB 4-0 PS2 18 (SUTURE) ×1 IMPLANT
SUT PDS AB 2-0 CT2 27 (SUTURE) IMPLANT
SUT SILK 2 0 SH (SUTURE) IMPLANT
SUT VIC AB 3-0 SH 27 (SUTURE) ×1
SUT VIC AB 3-0 SH 27X BRD (SUTURE) IMPLANT
SUT VIC AB 4-0 PS2 18 (SUTURE) IMPLANT
SUT VICRYL 0 CT-2 (SUTURE) IMPLANT
SUT VLOC 180 0 24IN GS25 (SUTURE) IMPLANT
SYR 10ML LL (SYRINGE) ×1 IMPLANT
SYR 50ML LL SCALE MARK (SYRINGE) ×1 IMPLANT
SYR BULB IRRIG 60ML STRL (SYRINGE) ×1 IMPLANT
SYR CONTROL 10ML LL (SYRINGE) IMPLANT
TAPE MEASURE VINYL STERILE (MISCELLANEOUS) IMPLANT
TISSUE ALLDRM 8X16 MED THICK (Tissue) IMPLANT
TOWEL GREEN STERILE FF (TOWEL DISPOSABLE) ×1 IMPLANT
TRAY DSU PREP LF (CUSTOM PROCEDURE TRAY) IMPLANT
TUBE CONNECTING 20X1/4 (TUBING) IMPLANT
UNDERPAD 30X36 HEAVY ABSORB (UNDERPADS AND DIAPERS) ×2 IMPLANT
YANKAUER SUCT BULB TIP NO VENT (SUCTIONS) ×1 IMPLANT

## 2022-08-31 NOTE — Op Note (Signed)
Operative Note   DATE OF OPERATION: 8.6.2024  LOCATION: [facility]-outpatient  SURGICAL DIVISION: Plastic Surgery  PREOPERATIVE DIAGNOSES:  1. History breast cancer 2. Acquired absence breasts  POSTOPERATIVE DIAGNOSES:  same  PROCEDURE:  1. Revision right breast reconstruction with silicone implant exchange 2. Acellular dermis (Alloderm) to right chest 8 x 16 cm  SURGEON: Glenna Fellows MD MBA  ASSISTANT: none  ANESTHESIA:  General.   EBL: 15 ml  COMPLICATIONS: None immediate.   INDICATIONS FOR PROCEDURE:  The patient, Jaime Murillo, is a 62 y.o. female born on 1960-03-01, is here for revision right breast reconstruction for inferior displacement implant.    FINDINGS: Removed intact smooth round silicone implant. Placed Natrelle Soft Touch smooth round Extra Projection 375 ml implant REF [id number] SN [id number]. Medial chest wall palpable area excised. 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 Patient has history of extracapsular silicone rupture over right chest.  DESCRIPTION OF PROCEDURE:  The patient's operative site was marked with the patient in the preoperative area. The patient was taken to the operating room. SCDs were placed and IV antibiotics were given. The patient's operative site was prepped and draped in a sterile fashion. A time out was performed and all information was confirmed to be correct. Incision made in right inframammary fold scar. Incision carried through superficial fascia to capsule. Implant removed. Interrupted 2- 0 PDS placed from superficial fascia caudal to incision to chest wall to stabilize fold. Acellular dermis perforated and prepared. This was inset to chest wall with 2-0 PDS suture including additional sutures at desired inframammary fold. 15 Fr JP placed percutaneously and secured with 2-0 nylon. Cavity irrigated with saline solution containing Ancef, gentamicin, and Betadine. Local anesthetic infiltrated. New  implant placed in cavity. Implant orientation ensured. Caudal border of acellular dermis inset to anterior mastectomy flap with interrupted 2-0 PDS suture. Incision closure completed with 3-0 vicryl in superficial fascia, 4-0 vicryl in dermis, and 4-0 monocryl subcuticular skin closure. Dermabond applied. Tegaderms applied. Dry dressing and breast binder applied.   The patient was allowed to wake from anesthesia, extubated and taken to the recovery room in satisfactory condition.   SPECIMENS: right chest wall  DRAINS: 15 Fr JP in right reconstruction  Glenna Fellows, MD [facility]astic [facility]  Office/ physician access line after hours [phone]

## 2022-08-31 NOTE — Anesthesia Postprocedure Evaluation (Signed)
Anesthesia Post Note  Patient: Engineer, drilling  Procedure(s) Performed: BREAST CAPSULECTOMY WITH IMPLANT EXCHANGE (Right: Chest) APPLICATION OF ALLODERM OF CHEST (Right: Chest)     Patient location during evaluation: PACU Anesthesia Type: General Level of consciousness: awake and alert Pain management: pain level controlled Vital Signs Assessment: post-procedure vital signs reviewed and stable Respiratory status: spontaneous breathing, nonlabored ventilation and respiratory function stable Cardiovascular status: blood pressure returned to baseline and stable Postop Assessment: no apparent nausea or vomiting Anesthetic complications: no   No notable events documented.  Last Vitals:  Vitals:   08/31/22 1101 08/31/22 1115  BP:  (!) 168/103  Pulse: 67 70  Resp: 18 14  Temp:    SpO2: 100% 100%    Last Pain:  Vitals:   08/31/22 1101  TempSrc:   PainSc: 3                  Lowella Curb

## 2022-08-31 NOTE — Interval H&P Note (Signed)
History and Physical Interval Note:  08/31/2022 7:37 AM  Jaime Murillo  has presented today for surgery, with the diagnosis of History breast cancer acquired absence breasts.  The various methods of treatment have been discussed with the patient and family. After consideration of risks, benefits and other options for treatment, the patient has consented to revision right breast reconstruction with silicone implant exchange acellular dermis to right chest as a surgical intervention.  The patient's history has been reviewed, patient examined, no change in status, stable for surgery.  I have reviewed the patient's chart and labs.  Questions were answered to the patient's satisfaction.     Jaime Murillo 

## 2022-08-31 NOTE — Anesthesia Procedure Notes (Signed)
Procedure Name: Intubation Date/Time: 08/31/2022 8:19 AM  Performed by: Ronnette Hila, CRNAPre-anesthesia Checklist: Patient identified, Emergency Drugs available, Suction available and Patient being monitored Patient Re-evaluated:Patient Re-evaluated prior to induction Oxygen Delivery Method: Circle system utilized Preoxygenation: Pre-oxygenation with 100% oxygen Induction Type: IV induction Ventilation: Mask ventilation without difficulty Laryngoscope Size: Mac and 3 Grade View: Grade I Tube type: Oral Tube size: 7.0 mm Number of attempts: 1 Airway Equipment and Method: Stylet and Oral airway Placement Confirmation: ETT inserted through vocal cords under direct vision, positive ETCO2 and breath sounds checked- equal and bilateral Secured at: 22 cm Tube secured with: Tape Dental Injury: Teeth and Oropharynx as per pre-operative assessment

## 2022-08-31 NOTE — Discharge Instructions (Addendum)
No Tylenol before 2:00pm if needed. No ibuprofen before 4:00pm if needed.  Post Anesthesia Home Care Instructions  Activity: Get plenty of rest for the remainder of the day. A responsible individual must stay with you for 24 hours following the procedure.  For the next 24 hours, DO NOT: -Drive a car -Advertising copywriter -Drink alcoholic beverages -Take any medication unless instructed by your physician -Make any legal decisions or sign important papers.  Meals: Start with liquid foods such as gelatin or soup. Progress to regular foods as tolerated. Avoid greasy, spicy, heavy foods. If nausea and/or vomiting occur, drink only clear liquids until the nausea and/or vomiting subsides. Call your physician if vomiting continues.  Special Instructions/Symptoms: Your throat may feel dry or sore from the anesthesia or the breathing tube placed in your throat during surgery. If this causes discomfort, gargle with warm salt water. The discomfort should disappear within 24 hours.  If you had a scopolamine patch placed behind your ear for the management of post- operative nausea and/or vomiting:  1. The medication in the patch is effective for 72 hours, after which it should be removed.  Wrap patch in a tissue and discard in the trash. Wash hands thoroughly with soap and water. 2. You may remove the patch earlier than 72 hours if you experience unpleasant side effects which may include dry mouth, dizziness or visual disturbances. 3. Avoid touching the patch. Wash your hands with soap and water after contact with the patch.

## 2022-08-31 NOTE — Anesthesia Preprocedure Evaluation (Signed)
Anesthesia Evaluation  Patient identified by MRN, date of birth, ID band Patient awake    Reviewed: Allergy & Precautions, H&P , NPO status , Patient's Chart, lab work & pertinent test results  History of Anesthesia Complications (+) PONV and history of anesthetic complications  Airway Mallampati: II  TM Distance: >3 FB Neck ROM: Full    Dental no notable dental hx.    Pulmonary neg pulmonary ROS   Pulmonary exam normal breath sounds clear to auscultation       Cardiovascular hypertension, Pt. on medications negative cardio ROS Normal cardiovascular exam Rhythm:Regular Rate:Normal     Neuro/Psych   Anxiety Depression     Neuromuscular disease  negative psych ROS   GI/Hepatic negative GI ROS, Neg liver ROS,,,  Endo/Other  negative endocrine ROS    Renal/GU negative Renal ROS  negative genitourinary   Musculoskeletal negative musculoskeletal ROS (+)    Abdominal   Peds negative pediatric ROS (+)  Hematology negative hematology ROS (+)   Anesthesia Other Findings   Reproductive/Obstetrics negative OB ROS                             Anesthesia Physical Anesthesia Plan  ASA: 2  Anesthesia Plan: General   Post-op Pain Management: Celebrex PO (pre-op)* and Tylenol PO (pre-op)*   Induction: Intravenous  PONV Risk Score and Plan: 4 or greater and Ondansetron, Dexamethasone, Droperidol, Treatment may vary due to age or medical condition and Scopolamine patch - Pre-op  Airway Management Planned: LMA  Additional Equipment:   Intra-op Plan:   Post-operative Plan: Extubation in OR  Informed Consent: I have reviewed the patients History and Physical, chart, labs and discussed the procedure including the risks, benefits and alternatives for the proposed anesthesia with the patient or authorized representative who has indicated his/her understanding and acceptance.     Dental advisory  given  Plan Discussed with: CRNA and Surgeon  Anesthesia Plan Comments:        Anesthesia Quick Evaluation

## 2022-08-31 NOTE — Transfer of Care (Signed)
Immediate Anesthesia Transfer of Care Note  Patient: Jaime Murillo  Procedure(s) Performed: BREAST CAPSULECTOMY WITH IMPLANT EXCHANGE (Right: Chest) APPLICATION OF ALLODERM OF CHEST (Right: Chest)  Patient Location: PACU  Anesthesia Type:General  Level of Consciousness: drowsy and patient cooperative  Airway & Oxygen Therapy: Patient Spontanous Breathing and Patient connected to face mask oxygen  Post-op Assessment: Report given to RN and Post -op Vital signs reviewed and stable  Post vital signs: Reviewed and stable  Last Vitals:  Vitals Value Taken Time  BP    Temp    Pulse    Resp 16 08/31/22 1015  SpO2    Vitals shown include unfiled device data.  Last Pain:  Vitals:   08/31/22 0753  TempSrc: Temporal  PainSc: 0-No pain         Complications: No notable events documented.

## 2022-09-01 ENCOUNTER — Encounter (HOSPITAL_BASED_OUTPATIENT_CLINIC_OR_DEPARTMENT_OTHER): Payer: Self-pay | Admitting: Plastic Surgery

## 2022-09-01 ENCOUNTER — Ambulatory Visit: Payer: 59

## 2022-09-02 ENCOUNTER — Ambulatory Visit: Payer: 59

## 2022-09-03 DIAGNOSIS — Z01818 Encounter for other preprocedural examination: Secondary | ICD-10-CM

## 2022-09-28 ENCOUNTER — Ambulatory Visit: Payer: 59

## 2022-10-11 ENCOUNTER — Ambulatory Visit: Payer: 59 | Attending: Plastic Surgery | Admitting: Rehabilitation

## 2022-10-11 ENCOUNTER — Encounter: Payer: Self-pay | Admitting: Rehabilitation

## 2022-10-11 DIAGNOSIS — M25611 Stiffness of right shoulder, not elsewhere classified: Secondary | ICD-10-CM | POA: Diagnosis present

## 2022-10-11 DIAGNOSIS — Z853 Personal history of malignant neoplasm of breast: Secondary | ICD-10-CM | POA: Diagnosis present

## 2022-10-11 DIAGNOSIS — Z9189 Other specified personal risk factors, not elsewhere classified: Secondary | ICD-10-CM | POA: Diagnosis present

## 2022-10-11 NOTE — Therapy (Signed)
OUTPATIENT PHYSICAL THERAPY  UPPER EXTREMITY ONCOLOGY TREATMENT  Patient Name: Jaime Murillo MRN: [id number] DOB:05/18/1960, 62 y.o., female Today's Date: 10/11/2022  END OF SESSION:  PT End of Session - 10/11/22 1433     Visit Number 9    Number of Visits 15    Date for PT Re-Evaluation 10/22/22    PT Start Time 1400    PT Stop Time 1425    PT Time Calculation (min) 25 min    Activity Tolerance Patient tolerated treatment well    Behavior During Therapy [facility] for tasks assessed/performed               Past Medical History:  Diagnosis Date   Anemia    Anxiety    Depression    History of cancer chemotherapy 07-17-2012  to  12-04-2012   right breast cancer   HSV infection    Hypertension    Insomnia    Malignant neoplasm of lower-inner quadrant of right breast of female, estrogen receptor negative [facility]) oncologist-  dr Pamelia Hoit--  per lov note in epic ,  no recurrence   dx 05/ 2014---  right breast IDC & DCIS, Grade 3, triple negative----  completed neo-adjuvant chemo 12-04-2012,  03-20-2013  right total mastectomy with sln bx (07-24-2013  left simple mastectomy w/ sln bx)   Neuropathy associated with cancer (HCC)    related to chemotherapy   Neuropathy of both feet    DUE TO CHEMO PER PATIENT   Nocturia    PONV (postoperative nausea and vomiting)    shaking after anesthesia; had n/v after mastectomy   Urinary frequency    Wears glasses    Past Surgical History:  Procedure Laterality Date   AIKEN OSTEOTOMY Right 01/27/2018   Procedure: DOUBLE   Lesly Dukes;  Surgeon: Park Liter, DPM;  Location: [facility];  Service: Podiatry;  Laterality: Right;  POPLITEAL/SAPH BLOCK   ALLOGRAFT APPLICATION Right 08/31/2022   Procedure: APPLICATION OF ALLODERM OF CHEST;  Surgeon: Glenna Fellows, MD;  Location: [facility];  Service: Plastics;  Laterality: Right;   BREAST CAPSULECTOMY WITH IMPLANT EXCHANGE Right 08/31/2022   Procedure: BREAST  CAPSULECTOMY WITH IMPLANT EXCHANGE;  Surgeon: Glenna Fellows, MD;  Location: [facility];  Service: Plastics;  Laterality: Right;   BREAST IMPLANT REMOVAL Bilateral 05/14/2022   Procedure: REMOVAL BILATERAL BREAST IMPLANTS;  Surgeon: Glenna Fellows, MD;  Location: [facility];  Service: Plastics;  Laterality: Bilateral;   BREAST LUMPECTOMY Right 01/12/2013   Procedure: RIGHT SENTINEL LYMPH  NODE BIOPSY;  Surgeon: Almond Lint, MD;  Location: [facility];  Service: General;  Laterality: Right;  RIGHT SENTINEL LYMPH NODE BIOPSY   BREAST RECONSTRUCTION WITH PLACEMENT OF TISSUE EXPANDER AND FLEX HD (ACELLULAR HYDRATED DERMIS) Right 03/20/2013   Procedure: IMMEDIATE RIGHT BREAST RECONSTRUCTION WITH PLACEMENT OF TISSUE EXPANDER AND FLEX HD (ACELLULAR HYDRATED DERMIS);  Surgeon: Glenna Fellows, MD;  Location: [facility];  Service: Plastics;  Laterality: Right;   BREAST RECONSTRUCTION WITH PLACEMENT OF TISSUE EXPANDER AND FLEX HD (ACELLULAR HYDRATED DERMIS) Left 07/24/2013   Procedure: BREAST RECONSTRUCTION WITH PLACEMENT OF TISSUE EXPANDER AND FLEX HD (ACELLULAR HYDRATED DERMIS) TO THE LEFT BREAST;  Surgeon: Glenna Fellows, MD;  Location: [facility];  Service: Plastics;  Laterality: Left;   BREAST RECONSTRUCTION WITH PLACEMENT OF TISSUE EXPANDER AND FLEX HD (ACELLULAR HYDRATED DERMIS) Bilateral 01/07/2014   Procedure: BREAST RECONSTRUCTION WITH REMOVAL  OF TISSUE EXPANDER AND PLACEMENT  OF  SILICONE IMPLANTS TO BILATERAL BREAST;  Surgeon: Glenna Fellows, MD;  Location: [facility];  Service: Plastics;  Laterality: Bilateral;   CAPSULECTOMY Right 05/14/2022   Procedure: RIGHT CAPSULECTOMY AND REMOVAL IMPLANT MATERIAL RIGHT CHEST;  Surgeon: Glenna Fellows, MD;  Location: [facility];  Service: Plastics;  Laterality: Right;   COLONOSCOPY     LAPAROSCOPIC TUBAL LIGATION Bilateral 08-10-2001    dr Gaynell Face  @[facility]    with cauterization   PLACEMENT  OF BREAST IMPLANTS Bilateral 05/14/2022   Procedure: PLACEMENT OF BILATERAL BREAST IMPLANTS;  Surgeon: Glenna Fellows, MD;  Location: [facility];  Service: Plastics;  Laterality: Bilateral;   PORT-A-CATH REMOVAL Left 01/12/2013   Procedure: REMOVAL PORT-A-CATH;  Surgeon: Almond Lint, MD;  Location: [facility];  Service: General;  Laterality: Left;   PORTACATH PLACEMENT N/A 07/06/2012   Procedure: INSERTION PORT-A-CATH;  Surgeon: Almond Lint, MD;  Location: [facility];  Service: General;  Laterality: N/A;   SIMPLE MASTECTOMY WITH AXILLARY SENTINEL NODE BIOPSY Left 07/24/2013   Procedure: SIMPLE MASTECTOMY;  Surgeon: Almond Lint, MD;  Location: [facility];  Service: General;  Laterality: Left;   TOTAL MASTECTOMY Right 03/20/2013   Procedure: RIGHT SKIN SPARING TOTAL MASTECTOMY;  Surgeon: Almond Lint, MD;  Location: [facility];  Service: General;  Laterality: Right;   TRANSTHORACIC ECHOCARDIOGRAM  07/03/2012   ef 55-60%/  mild MR and TR/  mild dilated IVC   Patient Active Problem List   Diagnosis Date Noted   Women's annual routine gynecological examination 04/07/2020   History of breast cancer 04/07/2020   Screening examination for STD (sexually transmitted disease) 04/07/2020   Acquired absence of breast and nipple 01/07/2014   Personal history of breast cancer 07/11/2013   Fibroid uterus 07/04/2013   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   High blood pressure    Contact lens/glasses fitting    Malignant neoplasm of lower-inner quadrant of right breast of female, estrogen receptor negative (HCC) 06/23/2012   HSV (herpes simplex virus) anogenital infection 06/06/2012   Breast mass, right 06/06/2012   HTN (hypertension) 05/05/2011    PCP: Loura Back, NP  REFERRING PROVIDER: Dr. Leta Baptist  REFERRING DIAG: Post Mastectomy   THERAPY DIAG:  Personal history of breast cancer  Stiffness of right shoulder, not elsewhere  classified  At risk for lymphedema  ONSET DATE:   Rationale for Evaluation and Treatment: Rehabilitation  SUBJECTIVE:                                                                                                                                                                                           SUBJECTIVE  STATEMENT: I had my new surgery and I feel back to normal.    PERTINENT HISTORY: Triple negative Rt breast cancer treated in 2014 with chemo-xrt and then Rt mastectomy with implant reconstruction and 3 neg nodes removed.  Then Lt risk reducing mastectomy with implant.  Palpated nodes in axilla 02/01/22 which showed possible Rt implant rupture. Removal and switch of implants 05/14/22.  Other hx: In February; 03/01/22. Discectomy and laminectomy in lumbar spine.   - no restrictions for back - [facility]: Dr. Val Eagle  PAIN:  Are you having pain? No not really   PRECAUTIONS: Rt lymphedema risk  WEIGHT BEARING RESTRICTIONS: No  FALLS:  Has patient fallen in last 6 months? 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   OBSERVATIONS / OTHER ASSESSMENTS: Incisions well healed   POSTURE: WNL  UPPER EXTREMITY AROM/PROM:  A/PROM RIGHT   eval  07/07/22 07/20/22 08/27/22 10/11/22  Shoulder extension 50 50   50  Shoulder flexion 85 pn 110  110 125 - tight in back 145  Shoulder abduction 70 105 110 115  160  Shoulder internal rotation       Shoulder external rotation     Full     (Blank rows = not tested)  A/PROM LEFT   eval 07/07/22 07/20/22 08/27/22 10/11/22  Shoulder extension 35 42   50  Shoulder flexion 80 105 120 120 145  Shoulder abduction 80 90 135 115 160  Shoulder internal rotation       Shoulder external rotation     Full    (Blank rows = not tested)  Lumbar AROM: flexion: 85% with flat back and tightness, Ext: full but tight, Rot bil: full but tight,  Lat Flex: full but tight  Sits in APT and has some increased pain with APT vs PPT  UPPER EXTREMITY STRENGTH: 4+/5  QUICK DASH SURVEY: 68% on EVAL, 31% on 08/27/22, 4.5% limited on 10/11/22   TODAY'S TREATMENT:                                                                                                                                          DATE: 10/11/22 Recheck Review of lymphedema risk reduction and ABC material   08/27/22 Recheck x Therapeutic Exercises Pulleys into flexion and abduction x each Roll yellow ball up wall into flexion x 5 Supine TrA education: cueing  to pull belly button towards the spine, or like a bowling ball is going to drop but still breathing, the TrA added to all shoulder stretches Supine dowel flexion x 5 with flat back cueing  "snow angel" for bil abd x5, alternating flexion x 5, all with TrA activation added for low back, alt horizontal abduction Supine Hamstring Stretch with Strap - 3 reps - 20-30 seconds hold Supine Hamstring Stretch  - 1 x daily - 7 x weekly - 1 sets - 3 reps - 20-30 seconds holdS Supine Lower Trunk Rotation - 3 reps Supine Single Knee to Chest Stretch  - 3 reps - 20-30 seconds hold bil Seated posture education to decrease back pain - pts sits in more of an APT so we worked on sitting around half way between APT and PPT.   07/22/22: Therapeutic Exercises Pulleys into flexion and abduction x each Roll yellow ball up wall into flexion x 5 and abduction x 5 with cueing  Supine dowel flexion x 5  "snow angel" for bil abd x5,  alternating flexion x 5, pro/ret x 5, extension presses,  all with TrA activation added for low back, attempted alternating horizontal abduction but this felt too tight Row yellow x 10 Manual Therapy P/ROM to bil shoulders in supine into flexion, abd and D2 to pts available end motions.   07/20/22: Therapeutic Exercises Pulleys into flexion and abduction x 2:45min each with cueing Roll yellow ball up wall into flexion x 5 Education on good back/ general stretch for her trip per below with performance and cueing for each Supine: diaphragmatic breathing x 10, Horz abd x7, "snow angel" for bil abd x5, alternating flexion x 5, pro/ret x 5, extension presses,  all with TrA activation added for low back Manual Therapy P/ROM to bil shoulders in supine into flexion, abd and D2 to pts available end motions.   PATIENT EDUCATION:  Education details: POC, initial HEP Person educated: Patient Education method: Programmer, multimedia, Demonstration, Verbal cues, and Handouts Education comprehension: verbalized understanding  HOME EXERCISE PROGRAM: Post op Supine dowel flexion or clasped hands   Access Code: [email or web address] URL: [email or web address] Date: 07/20/2022 Prepared by: Gwenevere Abbot  Exercises - Standing Gastroc Stretch  - 1 x daily - 7 x weekly - 1 sets - 3 reps - 20-30 seconds hold - Supine Hamstring Stretch with Strap  - 1 x daily - 7 x weekly - 1 sets - 3 reps - 20-30 seconds hold - Supine Hamstring Stretch  - 1 x daily - 7 x weekly - 1 sets - 3 reps - 20-30 seconds hold - Supine Lower Trunk Rotation  - 1 x daily - 7 x weekly - 1 sets - 3 reps - 20-30 seconds hold - Supine Single Knee to Chest Stretch  - 1 x daily - 7 x weekly - 1 sets - 3 reps - 20-30 seconds hold  ASSESSMENT:  CLINICAL IMPRESSION:   Pt returns after implant change after rupture reporting feeling normal.  No back pain or no arm limitations.  All goals are met and pt is very happy.   OBJECTIVE IMPAIRMENTS: decreased  activity tolerance, decreased mobility, decreased ROM, decreased strength, and increased edema.   ACTIVITY LIMITATIONS: carrying, lifting, sleeping, and reach over head  PARTICIPATION LIMITATIONS: cleaning, community activity, and occupation  PERSONAL FACTORS: 1 comorbidity: SLNB on Rt  are also affecting patient's functional outcome.   REHAB POTENTIAL: Excellent  CLINICAL DECISION MAKING: Stable/uncomplicated  EVALUATION COMPLEXITY: Low  GOALS: Goals reviewed with  patient? Yes  SHORT TERM GOALS=LTGs: Target date: 10/22/22  Pt will improve bil shoulder AROM to WNL to allow patient to return to all work and daily activities Baseline: Goal status: MET - pt may not return to work but is ind with ADLs  2.  Pt will be able to ride the bus to work carrying her things without limitations Baseline:  Goal status: DEFERRED  3.  Pt will report feeling ready to go on her mission trip in regards to her shoulder and back  Baseline:  Goal status: MET  4. Pt will be ind with final HEP for back and shoulder mobility and strength  MET  PLAN:  PT FREQUENCY: 2x/week  PT DURATION: 8 weeks with break for implant exchange.    PLANNED INTERVENTIONS: Therapeutic exercises, Therapeutic activity, Neuromuscular re-education, Patient/Family education, Self Care, Joint mobilization, Manual therapy, and Re-evaluation, heat/ice for back as needed.   PLAN FOR NEXT SESSION: bil shoulder ROM, lumbar flexibility, start easy core and activation work, may like warm up on bike?   Idamae Lusher, PT 10/11/2022, 2:33 PM  PHYSICAL THERAPY DISCHARGE SUMMARY  Visits from Start of Care: 9  Current functional level related to goals / functional outcomes: All goals met   Remaining deficits: Lymphedema risk    Education / Equipment: Final HEP, has sleeve  Plan: Patient agrees to discharge. Patient is being discharged due to meeting the stated rehab goals.

## 2022-11-06 LAB — AMB RESULTS CONSOLE CBG: Glucose: 104

## 2023-04-19 ENCOUNTER — Encounter (HOSPITAL_COMMUNITY): Payer: Self-pay | Admitting: *Deleted

## 2023-04-19 ENCOUNTER — Ambulatory Visit (HOSPITAL_COMMUNITY)
Admission: EM | Admit: 2023-04-19 | Discharge: 2023-04-19 | Disposition: A | Discharge status: Home / Self Care | Attending: Family Medicine | Admitting: Family Medicine

## 2023-04-19 DIAGNOSIS — J069 Acute upper respiratory infection, unspecified: Secondary | ICD-10-CM | POA: Diagnosis not present

## 2023-04-19 LAB — POC COVID19/FLU A&B COMBO
Covid Antigen, POC: NEGATIVE
Influenza A Antigen, POC: NEGATIVE
Influenza B Antigen, POC: NEGATIVE

## 2023-04-19 MED ORDER — BENZONATATE 100 MG PO CAPS: 100.0000 mg | ORAL_CAPSULE | Freq: Three times a day (TID) | ORAL | 0 refills | Status: AC | PRN

## 2023-04-19 NOTE — ED Triage Notes (Signed)
 Pt states she has coughing, congestion, sneezing X 4 days. She is taking OTC meds without relief. She is worried since she has had cancer in the past and has a weaker immune system.

## 2023-04-19 NOTE — Discharge Instructions (Signed)
 Your flu and COVID test is negative.  Take benzonatate 100 mg, 1 tab every 8 hours as needed for cough.

## 2023-04-19 NOTE — ED Provider Notes (Signed)
 [facility]    CSN: [id number] Arrival date & time: 04/19/23  0808      History   Chief Complaint Chief Complaint  Patient presents with   Cough   Nasal Congestion    HPI Jaime Murillo is a 63 y.o. female.    Cough Here for nasal congestion.  Symptoms began on March 21.  No fever or chills and no nausea or vomiting or diarrhea.  She has not felt short of breath.  She is allergic to lyrica.  PMH: breast cancer 2014, with neuropathy. S/p chemo  Past Medical History:  Diagnosis Date   Anemia    Anxiety    Depression    History of cancer chemotherapy 07-17-2012  to  12-04-2012   right breast cancer   HSV infection    Hypertension    Insomnia    Malignant neoplasm of lower-inner quadrant of right breast of female, estrogen receptor negative [facility]) oncologist-  dr Pamelia Hoit--  per lov note in epic ,  no recurrence   dx 05/ 2014---  right breast IDC & DCIS, Grade 3, triple negative----  completed neo-adjuvant chemo 12-04-2012,  03-20-2013  right total mastectomy with sln bx (07-24-2013  left simple mastectomy w/ sln bx)   Neuropathy associated with cancer (HCC)    related to chemotherapy   Neuropathy of both feet    DUE TO CHEMO PER PATIENT   Nocturia    PONV (postoperative nausea and vomiting)    shaking after anesthesia; had n/v after mastectomy   Urinary frequency    Wears glasses     Patient Active Problem List   Diagnosis Date Noted   Women's annual routine gynecological examination 04/07/2020   History of breast cancer 04/07/2020   Screening examination for STD (sexually transmitted disease) 04/07/2020   Acquired absence of breast and nipple 01/07/2014   Personal history of breast cancer 07/11/2013   Fibroid uterus 07/04/2013   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   High blood pressure    Contact lens/glasses fitting    Malignant neoplasm of lower-inner quadrant of right breast of female, estrogen receptor negative (HCC)  06/23/2012   HSV (herpes simplex virus) anogenital infection 06/06/2012   Breast mass, right 06/06/2012   HTN (hypertension) 05/05/2011    Past Surgical History:  Procedure Laterality Date   AIKEN OSTEOTOMY Right 01/27/2018   Procedure: DOUBLE   Lesly Dukes;  Surgeon: Park Liter, DPM;  Location: [facility];  Service: Podiatry;  Laterality: Right;  POPLITEAL/SAPH BLOCK   ALLOGRAFT APPLICATION Right 08/31/2022   Procedure: APPLICATION OF ALLODERM OF CHEST;  Surgeon: Glenna Fellows, MD;  Location: [facility];  Service: Plastics;  Laterality: Right;   BREAST CAPSULECTOMY WITH IMPLANT EXCHANGE Right 08/31/2022   Procedure: BREAST CAPSULECTOMY WITH IMPLANT EXCHANGE;  Surgeon: Glenna Fellows, MD;  Location: [facility];  Service: Plastics;  Laterality: Right;   BREAST IMPLANT REMOVAL Bilateral 05/14/2022   Procedure: REMOVAL BILATERAL BREAST IMPLANTS;  Surgeon: Glenna Fellows, MD;  Location: [facility];  Service: Plastics;  Laterality: Bilateral;   BREAST LUMPECTOMY Right 01/12/2013   Procedure: RIGHT SENTINEL LYMPH  NODE BIOPSY;  Surgeon: Almond Lint, MD;  Location: [facility];  Service: General;  Laterality: Right;  RIGHT SENTINEL LYMPH NODE BIOPSY   BREAST RECONSTRUCTION WITH PLACEMENT OF TISSUE EXPANDER AND FLEX HD (ACELLULAR HYDRATED DERMIS) Right 03/20/2013   Procedure: IMMEDIATE RIGHT BREAST RECONSTRUCTION WITH PLACEMENT OF TISSUE EXPANDER AND FLEX HD (ACELLULAR  HYDRATED DERMIS);  Surgeon: Glenna Fellows, MD;  Location[facility];  Service: Plastics;  Laterality: Right;   BREAST RECONSTRUCTION WITH PLACEMENT OF TISSUE EXPANDER AND FLEX HD (ACELLULAR HYDRATED DERMIS) Left 07/24/2013   Procedure: BREAST RECONSTRUCTION WITH PLACEMENT OF TISSUE EXPANDER AND FLEX HD (ACELLULAR HYDRATED DERMIS) TO THE LEFT BREAST;  Surgeon: Glenna Fellows, MD;  Location: [facility];  Service: Plastics;  Laterality:  Left;   BREAST RECONSTRUCTION WITH PLACEMENT OF TISSUE EXPANDER AND FLEX HD (ACELLULAR HYDRATED DERMIS) Bilateral 01/07/2014   Procedure: BREAST RECONSTRUCTION WITH REMOVAL  OF TISSUE EXPANDER AND PLACEMENT  OF SILICONE IMPLANTS TO BILATERAL BREAST;  Surgeon: Glenna Fellows, MD;  [facility];  Service: Plastics;  Laterality: Bilateral;   CAPSULECTOMY Right 05/14/2022   Procedure: RIGHT CAPSULECTOMY AND REMOVAL IMPLANT MATERIAL RIGHT CHEST;  Surgeon: Glenna Fellows, MD;  Location: [facility];  Service: Plastics;  Laterality: Right;   COLONOSCOPY     LAPAROSCOPIC TUBAL LIGATION Bilateral 08-10-2001    dr Jaime Murillo  @[facility]    with cauterization   PLACEMENT OF BREAST IMPLANTS Bilateral 05/14/2022   Procedure: PLACEMENT OF BILATERAL BREAST IMPLANTS;  Surgeon: Glenna Fellows, MD;  Location: [facility];  Service: Plastics;  Laterality: Bilateral;   PORT-A-CATH REMOVAL Left 01/12/2013   Procedure: REMOVAL PORT-A-CATH;  Surgeon: Almond Lint, MD;  Location: [facility];  Service: General;  Laterality: Left;   PORTACATH PLACEMENT N/A 07/06/2012   Procedure: INSERTION PORT-A-CATH;  Surgeon: Almond Lint, MD;  [facility];  Service: General;  Laterality: N/A;   SIMPLE MASTECTOMY WITH AXILLARY SENTINEL NODE BIOPSY Left 07/24/2013   Procedure: SIMPLE MASTECTOMY;  Surgeon: Almond Lint, MD;  Location: [facility];  Service: General;  Laterality: Left;   TOTAL MASTECTOMY Right 03/20/2013   Procedure: RIGHT SKIN SPARING TOTAL MASTECTOMY;  Surgeon: Almond Lint, MD;  [facility];  Service: General;  Laterality: Right;   TRANSTHORACIC ECHOCARDIOGRAM  07/03/2012   ef 55-60%/  mild MR and TR/  mild dilated IVC    OB History     Gravida  3   Para  2   Term  2   Preterm      AB  1   Living  2      SAB  1   IAB      Ectopic      Multiple      Live Births  2            Home Medications    Prior to Admission medications    Medication Sig Start Date End Date Taking? Authorizing Provider  benzonatate (TESSALON) 100 MG capsule Take 1 capsule (100 mg total) by mouth 3 (three) times daily as needed for cough. 04/19/23  Yes Zenia Resides, MD  gabapentin (NEURONTIN) 300 MG capsule Take 2 capsules (600 mg total) by mouth at bedtime. 11/19/14  Yes Nilda Riggs, NP  hydrochlorothiazide (HYDRODIURIL) 25 MG tablet TAKE 1 TABLET BY MOUTH ONCE DAILY 12/30/14  Yes [name], MD  valACYclovir (VALTREX) 500 MG tablet Take 500 mg by mouth daily. 10/03/13  Yes [provider]  methocarbamol (ROBAXIN) 500 MG tablet Take 500 mg by mouth 3 (three) times daily. 06/17/22   [provider]  thiamine (VITAMIN B-1) 50 MG tablet Take 50 mg by mouth daily.    [provider]  prochlorperazine (COMPAZINE) 10 MG tablet Take 1 tablet (10 mg total) by mouth every 6 (six) hours as needed (  Nausea or vomiting). 11/06/12 12/13/12  Loa Socks, NP    Family History Family History  Problem Relation Age of Onset   Heart disease Mother    Aneurysm Mother    Hypertension Father    Prostate cancer Father 4   Hypertension Sister    Ovarian cancer Paternal Grandmother        died in her 26s   Stomach cancer Maternal Grandmother 26   Cancer Cousin        paternal cousin with an unknown form of cancer    Social History Social History   Tobacco Use   Smoking status: Never   Smokeless tobacco: Never  Vaping Use   Vaping status: Never Used  Substance Use Topics   Alcohol use: No    Alcohol/week: 0.0 standard drinks of alcohol   Drug use: No     Allergies   Lyrica [pregabalin]   Review of Systems Review of Systems  Respiratory:  Positive for cough.      Physical Exam Triage Vital Signs ED Triage Vitals  Encounter Vitals Group     BP 04/19/23 0833 128/82     Systolic BP Percentile --      Diastolic BP Percentile --      Pulse Rate 04/19/23 0833 78     Resp 04/19/23  0833 16     Temp 04/19/23 0833 97.9 F (36.6 C)     Temp Source 04/19/23 0833 Oral     SpO2 04/19/23 0833 96 %     Weight --      Height --      Head Circumference --      Peak Flow --      Pain Score 04/19/23 0832 0     Pain Loc --      Pain Education --      Exclude from Growth Chart --    No data found.  Updated Vital Signs BP 128/82 (BP Location: Left Arm)   Pulse 78   Temp 97.9 F (36.6 C) (Oral)   Resp 16   LMP 12/25/2012   SpO2 96%   Visual Acuity Right Eye Distance:   Left Eye Distance:   Bilateral Distance:    Right Eye Near:   Left Eye Near:    Bilateral Near:     Physical Exam Vitals reviewed.  Constitutional:      General: She is not in acute distress.    Appearance: She is not ill-appearing, toxic-appearing or diaphoretic.  HENT:     Right Ear: Tympanic membrane and ear canal normal.     Left Ear: Tympanic membrane and ear canal normal.     Nose: Nose normal.     Mouth/Throat:     Mouth: Mucous membranes are moist.     Pharynx: No oropharyngeal exudate or posterior oropharyngeal erythema.  Eyes:     Extraocular Movements: Extraocular movements intact.     Conjunctiva/sclera: Conjunctivae normal.     Pupils: Pupils are equal, round, and reactive to light.  Cardiovascular:     Rate and Rhythm: Normal rate and regular rhythm.     Heart sounds: No murmur heard. Pulmonary:     Effort: Pulmonary effort is normal. No respiratory distress.     Breath sounds: No stridor. No wheezing, rhonchi or rales.  Musculoskeletal:     Cervical back: Neck supple.  Lymphadenopathy:     Cervical: No cervical adenopathy.  Skin:    Capillary Refill: Capillary refill takes less than 2  seconds.     Coloration: Skin is not jaundiced or pale.  Neurological:     General: No focal deficit present.     Mental Status: She is alert and oriented to person, place, and time.  Psychiatric:        Behavior: Behavior normal.      UC Treatments / Results  Labs (all labs  ordered are listed, but only abnormal results are displayed) Labs Reviewed  POC COVID19/FLU A&B COMBO    EKG   Radiology No results found.  Procedures Procedures (including critical care time)  Medications Ordered in UC Medications - No data to display  Initial Impression / Assessment and Plan / UC Course  I have reviewed the triage vital signs and the nursing notes.  Pertinent labs & imaging results that were available during my care of the patient were reviewed by me and considered in my medical decision making (see chart for details).     Last eGFR in August 2024 was greater than 60.  Vital signs are reassuring and with no shortness of breath and a good lung exam, we did not do a chest x-ray today.  She asked about RSV testing.  With normal vital signs, I explained to her that RSV testing would not make any difference in her treatment at this time. (Also we are unable to order RSV testing and people outside interventions indicated in the lab test.)  Flu and COVID test was negative.  Tessalon Perles are sent in for the cough Final Clinical Impressions(s) / UC Diagnoses   Final diagnoses:  Viral URI with cough     Discharge Instructions      Your flu and COVID test is negative.  Take benzonatate 100 mg, 1 tab every 8 hours as needed for cough.      ED Prescriptions     Medication Sig Dispense Auth. Provider   benzonatate (TESSALON) 100 MG capsule Take 1 capsule (100 mg total) by mouth 3 (three) times daily as needed for cough. 21 capsule Zenia Resides, MD      PDMP not reviewed this encounter.   Zenia Resides, MD 04/19/23 [phone]

## 2023-07-14 ENCOUNTER — Telehealth: Payer: Self-pay | Admitting: Adult Health

## 2023-07-14 NOTE — Telephone Encounter (Signed)
 left vm for pt about rescheduled appt date and time

## 2023-07-19 ENCOUNTER — Encounter: Payer: 59 | Admitting: Adult Health

## 2023-08-02 ENCOUNTER — Inpatient Hospital Stay: Payer: Self-pay | Admitting: Adult Health

## 2023-08-02 NOTE — Progress Notes (Signed)
 Called pt to f/u about missed appt this am. Pt states that she had forgotten and will call and reschedule

## 2023-08-04 NOTE — Progress Notes (Signed)
 This encounter was created in error - please disregard.

## 2023-09-09 ENCOUNTER — Telehealth: Payer: Self-pay | Admitting: Adult Health

## 2023-09-09 NOTE — Telephone Encounter (Signed)
 Called patient to schedule an appointment per left VM. Unable to make contact with the patient about the appointment; therefore, asked the patient to call back at [phone].

## 2023-12-07 ENCOUNTER — Encounter (HOSPITAL_BASED_OUTPATIENT_CLINIC_OR_DEPARTMENT_OTHER): Payer: Self-pay

## 2023-12-07 ENCOUNTER — Emergency Department (HOSPITAL_BASED_OUTPATIENT_CLINIC_OR_DEPARTMENT_OTHER)
Admission: EM | Admit: 2023-12-07 | Discharge: 2023-12-07 | Disposition: A | Discharge status: Home / Self Care | Attending: Emergency Medicine | Admitting: Emergency Medicine

## 2023-12-07 ENCOUNTER — Other Ambulatory Visit: Payer: Self-pay

## 2023-12-07 ENCOUNTER — Emergency Department (HOSPITAL_BASED_OUTPATIENT_CLINIC_OR_DEPARTMENT_OTHER): Admitting: Radiology

## 2023-12-07 DIAGNOSIS — M79644 Pain in right finger(s): Secondary | ICD-10-CM

## 2023-12-07 DIAGNOSIS — R2231 Localized swelling, mass and lump, right upper limb: Secondary | ICD-10-CM | POA: Diagnosis present

## 2023-12-07 DIAGNOSIS — Z853 Personal history of malignant neoplasm of breast: Secondary | ICD-10-CM | POA: Diagnosis not present

## 2023-12-07 DIAGNOSIS — Z79899 Other long term (current) drug therapy: Secondary | ICD-10-CM | POA: Insufficient documentation

## 2023-12-07 DIAGNOSIS — I10 Essential (primary) hypertension: Secondary | ICD-10-CM | POA: Insufficient documentation

## 2023-12-07 NOTE — ED Provider Notes (Addendum)
 [facility] Provider Note   CSN: [id number] Arrival date & time: 12/07/23  9362     Patient presents with: Finger Injury   Jaime Murillo is a 63 y.o. female.   Patient is a 63 year old female who presents with pain and swelling in her right thumb.  It started swelling about a week ago and then started becoming more painful over the last 2 days.  She denies any known injuries although she does do a lot of jump roping and thinks she may have injured it when she was jump roping but she cannot remember.  She denies any other joint issues such as gout.  No wounds to the area.       Prior to Admission medications   Medication Sig Start Date End Date Taking? Authorizing Provider  benzonatate  (TESSALON ) 100 MG capsule Take 1 capsule (100 mg total) by mouth 3 (three) times daily as needed for cough. 04/19/23   Vonna Sharlet POUR, MD  gabapentin  (NEURONTIN ) 300 MG capsule Take 2 capsules (600 mg total) by mouth at bedtime. 11/19/14   Gladis Inocente Coy, NP  hydrochlorothiazide  (HYDRODIURIL ) 25 MG tablet TAKE 1 TABLET BY MOUTH ONCE DAILY 12/30/14   Winfred Curlee DEL, MD  methocarbamol  (ROBAXIN ) 500 MG tablet Take 500 mg by mouth 3 (three) times daily. 06/17/22   [provider]  thiamine (VITAMIN B-1) 50 MG tablet Take 50 mg by mouth daily.    [provider]  valACYclovir  (VALTREX ) 500 MG tablet Take 500 mg by mouth daily. 10/03/13   [provider]  prochlorperazine  (COMPAZINE ) 10 MG tablet Take 1 tablet (10 mg total) by mouth every 6 (six) hours as needed (Nausea or vomiting). 11/06/12 12/13/12  Crawford Morna Pickle, NP    Allergies: Lyrica  [pregabalin ]    Review of Systems  Constitutional:  Negative for fever.  Gastrointestinal:  Negative for nausea and vomiting.  Musculoskeletal:  Positive for arthralgias and joint swelling. Negative for back pain and neck pain.  Skin:  Negative for wound.  Neurological:   Negative for weakness, numbness and headaches.    Updated Vital Signs BP (!) 149/97 (BP Location: Right Arm)   Pulse 63   Temp 98 F (36.7 C) (Oral)   Resp 18   Ht 5' 3 (1.6 m)   Wt 54.9 kg   LMP 12/25/2012   SpO2 99%   BMI 21.43 kg/m   Physical Exam Constitutional:      Appearance: She is well-developed.  HENT:     Head: Normocephalic and atraumatic.  Cardiovascular:     Rate and Rhythm: Normal rate.  Pulmonary:     Effort: Pulmonary effort is normal.  Musculoskeletal:        General: Tenderness present.     Cervical back: Normal range of motion and neck supple.     Comments: Patient has swelling and tenderness to the base of the right thumb at the MCP joint.  There is no warmth or erythema.  No wounds.  She has full range of motion in the thumb.  Cap refills less than 2.  Sensation intact distally.  Skin:    General: Skin is warm and dry.  Neurological:     Mental Status: She is alert and oriented to person, place, and time.     (all labs ordered are listed, but only abnormal results are displayed) Labs Reviewed - No data to display  EKG: None  Radiology: No results found.   Procedures  Medications Ordered in the ED - No data to display                                  Medical Decision Making  This patient presents to the ED for concern of joint pain, this involves an extensive number of treatment options, and is a complaint that carries with it a high risk of complications and morbidity.  I considered the following differential and admission for this acute, potentially life threatening condition.  The differential diagnosis includes inflammatory arthritis, gout, septic joint, cellulitis  MDM:    Patient is a 63 year old who presents with pain and swelling to her the base of her right thumb.  No warmth or erythema or clinical concerns for infection is noted.  X-ray shows some mild subluxation of the proximal phalanx at the MCP joint.  Question whether  she has a tendon or ligamentous injury.  Will place in a thumb spica splint.  Will refer to follow-up with hand surgery.  (Labs, imaging, consults)  Labs: I Ordered, and personally interpreted labs.  The pertinent results include: None  Imaging Studies ordered: I ordered imaging studies including right thumb I independently visualized and interpreted imaging. I agree with the radiologist interpretation  Additional history obtained from  .  External records from outside source obtained and reviewed including    Cardiac Monitoring: The patient was not maintained on a cardiac monitor.  If on the cardiac monitor, I personally viewed and interpreted the cardiac monitored which showed an underlying rhythm of:    Reevaluation: After the interventions noted above, I reevaluated the patient and found that they have :improved  Social Determinants of Health:    Disposition: Discharged to home  Co morbidities that complicate the patient evaluation  Past Medical History:  Diagnosis Date   Anemia    Anxiety    Depression    History of cancer chemotherapy 07-17-2012  to  12-04-2012   right breast cancer   HSV infection    Hypertension    Insomnia    Malignant neoplasm of lower-inner quadrant of right breast of female, estrogen receptor negative [facility]) oncologist-  dr odean--  per lov note in epic ,  no recurrence   dx 05/ 2014---  right breast IDC & DCIS, Grade 3, triple negative----  completed neo-adjuvant chemo 12-04-2012,  03-20-2013  right total mastectomy with sln bx (07-24-2013  left simple mastectomy w/ sln bx)   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   I have reviewed the  patients home medicines and have made adjustments as needed  Problem List / ED Course: Problem List Items Addressed This Visit   None            Final diagnoses:  None    ED Discharge Orders     None          Lenor Hollering, MD 12/07/23 1449    Lenor Hollering, MD 12/07/23 1454

## 2023-12-07 NOTE — Discharge Instructions (Signed)
 Ice the area to help with the inflammation.  You can use Tylenol  and ibuprofen  for symptomatic relief.  Make an appointment to follow-up with the hand surgeon listed above.  Return to the emergency room if you have any worsening symptoms.

## 2023-12-07 NOTE — ED Triage Notes (Signed)
 Complaining of the right thumb being swollen and hurting for a week and a half. Has been getting bigger and more tender.
